# Patient Record
Sex: Male | Born: 1950 | Race: White | Hispanic: No | Marital: Married | Smoking: Former smoker
Health system: Southern US, Community
[De-identification: ages and names within clinical notes are randomized; demographics above are authoritative.]

## PROBLEM LIST (undated history)

## (undated) DIAGNOSIS — I480 Paroxysmal atrial fibrillation: Secondary | ICD-10-CM

## (undated) DIAGNOSIS — F32A Depression, unspecified: Secondary | ICD-10-CM

## (undated) DIAGNOSIS — J449 Chronic obstructive pulmonary disease, unspecified: Secondary | ICD-10-CM

## (undated) DIAGNOSIS — E785 Hyperlipidemia, unspecified: Secondary | ICD-10-CM

## (undated) DIAGNOSIS — M109 Gout, unspecified: Secondary | ICD-10-CM

## (undated) DIAGNOSIS — Z9221 Personal history of antineoplastic chemotherapy: Secondary | ICD-10-CM

## (undated) DIAGNOSIS — G40909 Epilepsy, unspecified, not intractable, without status epilepticus: Secondary | ICD-10-CM

## (undated) DIAGNOSIS — I341 Nonrheumatic mitral (valve) prolapse: Secondary | ICD-10-CM

## (undated) DIAGNOSIS — E291 Testicular hypofunction: Secondary | ICD-10-CM

## (undated) DIAGNOSIS — E875 Hyperkalemia: Secondary | ICD-10-CM

## (undated) DIAGNOSIS — M1990 Unspecified osteoarthritis, unspecified site: Secondary | ICD-10-CM

## (undated) DIAGNOSIS — K921 Melena: Secondary | ICD-10-CM

## (undated) DIAGNOSIS — F329 Major depressive disorder, single episode, unspecified: Secondary | ICD-10-CM

## (undated) DIAGNOSIS — IMO0002 Reserved for concepts with insufficient information to code with codable children: Secondary | ICD-10-CM

## (undated) DIAGNOSIS — R7303 Prediabetes: Secondary | ICD-10-CM

## (undated) DIAGNOSIS — R011 Cardiac murmur, unspecified: Secondary | ICD-10-CM

## (undated) DIAGNOSIS — C799 Secondary malignant neoplasm of unspecified site: Secondary | ICD-10-CM

## (undated) DIAGNOSIS — F17200 Nicotine dependence, unspecified, uncomplicated: Secondary | ICD-10-CM

## (undated) DIAGNOSIS — I10 Essential (primary) hypertension: Secondary | ICD-10-CM

## (undated) DIAGNOSIS — Z9981 Dependence on supplemental oxygen: Secondary | ICD-10-CM

## (undated) DIAGNOSIS — C61 Malignant neoplasm of prostate: Secondary | ICD-10-CM

## (undated) DIAGNOSIS — I2510 Atherosclerotic heart disease of native coronary artery without angina pectoris: Secondary | ICD-10-CM

## (undated) DIAGNOSIS — G5793 Unspecified mononeuropathy of bilateral lower limbs: Secondary | ICD-10-CM

## (undated) DIAGNOSIS — Z8719 Personal history of other diseases of the digestive system: Secondary | ICD-10-CM

## (undated) DIAGNOSIS — J189 Pneumonia, unspecified organism: Secondary | ICD-10-CM

## (undated) DIAGNOSIS — F419 Anxiety disorder, unspecified: Secondary | ICD-10-CM

## (undated) DIAGNOSIS — G43909 Migraine, unspecified, not intractable, without status migrainosus: Secondary | ICD-10-CM

## (undated) DIAGNOSIS — G4733 Obstructive sleep apnea (adult) (pediatric): Secondary | ICD-10-CM

## (undated) DIAGNOSIS — IMO0001 Reserved for inherently not codable concepts without codable children: Secondary | ICD-10-CM

## (undated) DIAGNOSIS — K635 Polyp of colon: Secondary | ICD-10-CM

## (undated) DIAGNOSIS — I209 Angina pectoris, unspecified: Secondary | ICD-10-CM

## (undated) DIAGNOSIS — Z9989 Dependence on other enabling machines and devices: Secondary | ICD-10-CM

## (undated) HISTORY — DX: Morbid (severe) obesity due to excess calories: E66.01

## (undated) HISTORY — DX: Essential (primary) hypertension: I10

## (undated) HISTORY — PX: HERNIA REPAIR: SHX51

## (undated) HISTORY — DX: Polyp of colon: K63.5

## (undated) HISTORY — PX: CARDIAC CATHETERIZATION: SHX172

## (undated) HISTORY — DX: Epilepsy, unspecified, not intractable, without status epilepticus: G40.909

## (undated) HISTORY — DX: Testicular hypofunction: E29.1

## (undated) HISTORY — DX: Hyperkalemia: E87.5

## (undated) HISTORY — DX: Unspecified osteoarthritis, unspecified site: M19.90

## (undated) HISTORY — DX: Melena: K92.1

## (undated) HISTORY — DX: Dependence on other enabling machines and devices: Z99.89

## (undated) HISTORY — PX: UMBILICAL HERNIA REPAIR: SHX196

## (undated) HISTORY — PX: CORONARY ANGIOPLASTY WITH STENT PLACEMENT: SHX49

## (undated) HISTORY — DX: Gout, unspecified: M10.9

## (undated) HISTORY — DX: Major depressive disorder, single episode, unspecified: F32.9

## (undated) HISTORY — DX: Anxiety disorder, unspecified: F41.9

## (undated) HISTORY — DX: Hyperlipidemia, unspecified: E78.5

## (undated) HISTORY — DX: Obstructive sleep apnea (adult) (pediatric): G47.33

## (undated) HISTORY — DX: Reserved for concepts with insufficient information to code with codable children: IMO0002

## (undated) HISTORY — DX: Nicotine dependence, unspecified, uncomplicated: F17.200

## (undated) HISTORY — DX: Reserved for inherently not codable concepts without codable children: IMO0001

## (undated) HISTORY — PX: CORONARY ANGIOPLASTY: SHX604

## (undated) HISTORY — DX: Paroxysmal atrial fibrillation: I48.0

## (undated) HISTORY — DX: Depression, unspecified: F32.A

## (undated) HISTORY — DX: Personal history of antineoplastic chemotherapy: Z92.21

## (undated) HISTORY — DX: Unspecified mononeuropathy of bilateral lower limbs: G57.93

## (undated) HISTORY — DX: Malignant neoplasm of prostate: C61

## (undated) HISTORY — DX: Atherosclerotic heart disease of native coronary artery without angina pectoris: I25.10

## (undated) HISTORY — DX: Chronic obstructive pulmonary disease, unspecified: J44.9

---

## 1997-10-30 ENCOUNTER — Inpatient Hospital Stay (HOSPITAL_COMMUNITY): Admission: EM | Admit: 1997-10-30 | Discharge: 1997-11-03 | Payer: Self-pay | Admitting: Emergency Medicine

## 2000-03-23 ENCOUNTER — Inpatient Hospital Stay (HOSPITAL_COMMUNITY): Admission: EM | Admit: 2000-03-23 | Discharge: 2000-03-25 | Payer: Self-pay | Admitting: Emergency Medicine

## 2000-09-21 ENCOUNTER — Encounter: Payer: Self-pay | Admitting: Emergency Medicine

## 2000-09-21 ENCOUNTER — Inpatient Hospital Stay (HOSPITAL_COMMUNITY): Admission: EM | Admit: 2000-09-21 | Discharge: 2000-09-23 | Payer: Self-pay | Admitting: Emergency Medicine

## 2001-12-03 ENCOUNTER — Encounter: Payer: Self-pay | Admitting: Family Medicine

## 2001-12-03 ENCOUNTER — Ambulatory Visit (HOSPITAL_COMMUNITY): Admission: RE | Admit: 2001-12-03 | Discharge: 2001-12-03 | Payer: Self-pay | Admitting: Family Medicine

## 2002-02-28 ENCOUNTER — Encounter: Payer: Self-pay | Admitting: Emergency Medicine

## 2002-02-28 ENCOUNTER — Inpatient Hospital Stay (HOSPITAL_COMMUNITY): Admission: EM | Admit: 2002-02-28 | Discharge: 2002-03-02 | Payer: Self-pay | Admitting: Emergency Medicine

## 2002-09-24 ENCOUNTER — Emergency Department (HOSPITAL_COMMUNITY): Admission: EM | Admit: 2002-09-24 | Discharge: 2002-09-24 | Payer: Self-pay | Admitting: Emergency Medicine

## 2002-09-24 ENCOUNTER — Encounter: Payer: Self-pay | Admitting: Emergency Medicine

## 2003-03-22 ENCOUNTER — Encounter: Payer: Self-pay | Admitting: Emergency Medicine

## 2003-03-22 ENCOUNTER — Inpatient Hospital Stay (HOSPITAL_COMMUNITY): Admission: EM | Admit: 2003-03-22 | Discharge: 2003-03-24 | Payer: Self-pay | Admitting: Emergency Medicine

## 2003-07-15 DIAGNOSIS — I2510 Atherosclerotic heart disease of native coronary artery without angina pectoris: Secondary | ICD-10-CM

## 2003-07-15 HISTORY — DX: Atherosclerotic heart disease of native coronary artery without angina pectoris: I25.10

## 2004-02-13 ENCOUNTER — Observation Stay (HOSPITAL_COMMUNITY): Admission: EM | Admit: 2004-02-13 | Discharge: 2004-02-14 | Payer: Self-pay

## 2004-03-05 ENCOUNTER — Ambulatory Visit (HOSPITAL_COMMUNITY): Admission: RE | Admit: 2004-03-05 | Discharge: 2004-03-06 | Payer: Self-pay | Admitting: *Deleted

## 2004-04-22 ENCOUNTER — Encounter (HOSPITAL_COMMUNITY): Admission: RE | Admit: 2004-04-22 | Discharge: 2004-07-21 | Payer: Self-pay | Admitting: *Deleted

## 2004-06-11 ENCOUNTER — Ambulatory Visit: Payer: Self-pay | Admitting: Cardiology

## 2004-06-11 ENCOUNTER — Inpatient Hospital Stay (HOSPITAL_COMMUNITY): Admission: AD | Admit: 2004-06-11 | Discharge: 2004-06-13 | Payer: Self-pay | Admitting: Cardiology

## 2004-06-28 ENCOUNTER — Ambulatory Visit: Payer: Self-pay

## 2004-07-14 DIAGNOSIS — K635 Polyp of colon: Secondary | ICD-10-CM

## 2004-07-14 HISTORY — DX: Polyp of colon: K63.5

## 2004-07-17 ENCOUNTER — Ambulatory Visit: Payer: Self-pay | Admitting: Cardiology

## 2004-07-17 ENCOUNTER — Inpatient Hospital Stay (HOSPITAL_COMMUNITY): Admission: EM | Admit: 2004-07-17 | Discharge: 2004-07-18 | Payer: Self-pay | Admitting: Family Medicine

## 2004-07-19 ENCOUNTER — Ambulatory Visit: Payer: Self-pay

## 2004-08-08 ENCOUNTER — Ambulatory Visit: Payer: Self-pay

## 2004-09-23 ENCOUNTER — Encounter (HOSPITAL_COMMUNITY): Admission: RE | Admit: 2004-09-23 | Discharge: 2004-12-22 | Payer: Self-pay | Admitting: Interventional Cardiology

## 2004-11-11 ENCOUNTER — Encounter: Payer: Self-pay | Admitting: Family Medicine

## 2004-12-12 ENCOUNTER — Ambulatory Visit (HOSPITAL_COMMUNITY): Admission: RE | Admit: 2004-12-12 | Discharge: 2004-12-12 | Payer: Self-pay | Admitting: *Deleted

## 2004-12-12 ENCOUNTER — Encounter (INDEPENDENT_AMBULATORY_CARE_PROVIDER_SITE_OTHER): Payer: Self-pay | Admitting: *Deleted

## 2004-12-12 ENCOUNTER — Encounter: Payer: Self-pay | Admitting: Family Medicine

## 2005-04-03 ENCOUNTER — Inpatient Hospital Stay (HOSPITAL_COMMUNITY): Admission: EM | Admit: 2005-04-03 | Discharge: 2005-04-04 | Payer: Self-pay | Admitting: Emergency Medicine

## 2005-07-14 LAB — HM COLONOSCOPY

## 2005-10-18 ENCOUNTER — Ambulatory Visit (HOSPITAL_COMMUNITY): Admission: RE | Admit: 2005-10-18 | Discharge: 2005-10-18 | Payer: Self-pay

## 2007-01-07 ENCOUNTER — Observation Stay (HOSPITAL_COMMUNITY): Admission: EM | Admit: 2007-01-07 | Discharge: 2007-01-08 | Payer: Self-pay | Admitting: Emergency Medicine

## 2007-01-08 ENCOUNTER — Encounter: Payer: Self-pay | Admitting: Family Medicine

## 2008-03-06 ENCOUNTER — Ambulatory Visit: Payer: Self-pay | Admitting: Cardiology

## 2008-03-06 ENCOUNTER — Inpatient Hospital Stay (HOSPITAL_COMMUNITY): Admission: AD | Admit: 2008-03-06 | Discharge: 2008-03-09 | Payer: Self-pay | Admitting: Cardiology

## 2008-03-06 ENCOUNTER — Encounter: Payer: Self-pay | Admitting: Emergency Medicine

## 2008-03-06 ENCOUNTER — Encounter: Payer: Self-pay | Admitting: Family Medicine

## 2008-05-14 ENCOUNTER — Encounter: Payer: Self-pay | Admitting: Family Medicine

## 2008-06-05 ENCOUNTER — Encounter: Payer: Self-pay | Admitting: Family Medicine

## 2008-11-22 ENCOUNTER — Emergency Department (HOSPITAL_BASED_OUTPATIENT_CLINIC_OR_DEPARTMENT_OTHER): Admission: EM | Admit: 2008-11-22 | Discharge: 2008-11-22 | Payer: Self-pay | Admitting: Emergency Medicine

## 2008-11-22 ENCOUNTER — Encounter: Payer: Self-pay | Admitting: Pulmonary Disease

## 2008-11-22 ENCOUNTER — Ambulatory Visit: Payer: Self-pay | Admitting: Radiology

## 2008-11-22 ENCOUNTER — Encounter: Payer: Self-pay | Admitting: Family Medicine

## 2009-04-09 DIAGNOSIS — I2583 Coronary atherosclerosis due to lipid rich plaque: Secondary | ICD-10-CM

## 2009-04-09 DIAGNOSIS — I2510 Atherosclerotic heart disease of native coronary artery without angina pectoris: Secondary | ICD-10-CM

## 2009-04-09 DIAGNOSIS — I10 Essential (primary) hypertension: Secondary | ICD-10-CM | POA: Insufficient documentation

## 2009-04-09 DIAGNOSIS — M1990 Unspecified osteoarthritis, unspecified site: Secondary | ICD-10-CM | POA: Insufficient documentation

## 2009-04-09 DIAGNOSIS — E669 Obesity, unspecified: Secondary | ICD-10-CM

## 2009-04-10 ENCOUNTER — Ambulatory Visit: Payer: Self-pay | Admitting: Pulmonary Disease

## 2009-04-10 DIAGNOSIS — R0602 Shortness of breath: Secondary | ICD-10-CM | POA: Insufficient documentation

## 2009-04-11 ENCOUNTER — Ambulatory Visit: Payer: Self-pay | Admitting: Pulmonary Disease

## 2009-04-13 ENCOUNTER — Encounter: Payer: Self-pay | Admitting: Pulmonary Disease

## 2009-04-13 ENCOUNTER — Telehealth: Payer: Self-pay | Admitting: Pulmonary Disease

## 2009-04-16 ENCOUNTER — Ambulatory Visit: Payer: Self-pay | Admitting: Pulmonary Disease

## 2009-04-16 DIAGNOSIS — J438 Other emphysema: Secondary | ICD-10-CM | POA: Insufficient documentation

## 2009-06-11 ENCOUNTER — Encounter: Payer: Self-pay | Admitting: Family Medicine

## 2009-08-27 ENCOUNTER — Telehealth (INDEPENDENT_AMBULATORY_CARE_PROVIDER_SITE_OTHER): Payer: Self-pay | Admitting: *Deleted

## 2009-12-14 ENCOUNTER — Telehealth (INDEPENDENT_AMBULATORY_CARE_PROVIDER_SITE_OTHER): Payer: Self-pay | Admitting: *Deleted

## 2010-06-24 ENCOUNTER — Telehealth: Payer: Self-pay | Admitting: Family Medicine

## 2010-06-24 ENCOUNTER — Ambulatory Visit: Payer: Self-pay | Admitting: Family Medicine

## 2010-06-24 DIAGNOSIS — L259 Unspecified contact dermatitis, unspecified cause: Secondary | ICD-10-CM

## 2010-06-24 DIAGNOSIS — J069 Acute upper respiratory infection, unspecified: Secondary | ICD-10-CM | POA: Insufficient documentation

## 2010-07-11 ENCOUNTER — Encounter: Payer: Self-pay | Admitting: Family Medicine

## 2010-07-11 DIAGNOSIS — F17200 Nicotine dependence, unspecified, uncomplicated: Secondary | ICD-10-CM | POA: Insufficient documentation

## 2010-07-22 ENCOUNTER — Encounter: Payer: Self-pay | Admitting: Family Medicine

## 2010-08-04 ENCOUNTER — Encounter: Payer: Self-pay | Admitting: *Deleted

## 2010-08-15 NOTE — Cardiovascular Report (Signed)
Summary: Lyn Records MD  Lyn Records MD   Imported By: Lester [place] 07/22/2010 10:25:27  _____________________________________________________________________  External Attachment:    Type:   Image     Comment:   External Document

## 2010-08-15 NOTE — Miscellaneous (Signed)
  Clinical Lists Changes  Problems: Added new problem of TOBACCO ABUSE (ICD-305.1) Allergies: Changed allergy or adverse reaction from ELAVIL to ELAVIL Observations: Added new observation of PAST MED HX: Obstructive sleep apnea DEGENERATIVE JOINT DISEASE (ICD-715.90) HYPERCHOLESTEROLEMIA (ICD-272.0) HYPERTENSION (ICD-401.9) CORONARY ARTERY DISEASE (ICD-414.00)--s/p MI 2005.  s/p PCI with stent (pt reports 20+ interventions in the past, most recent cath 6/08 showed patent stents).  Normal LV function. Nuclear stress test NEG 8/09. COPD Depression (?bipolar dx by psychiatrist?) Anxiety (07/11/2010 9:53) Added new observation of ALLERGY REV: Done (07/11/2010 9:53) Added new observation of PRIMARY MD: Michell Heinrich M.D. (07/11/2010 9:53)       Allergies: 1)  ! Zetia (Ezetimibe) 2)  ! Elavil    Past History:  Past Medical History: Obstructive sleep apnea DEGENERATIVE JOINT DISEASE (ICD-715.90) HYPERCHOLESTEROLEMIA (ICD-272.0) HYPERTENSION (ICD-401.9) CORONARY ARTERY DISEASE (ICD-414.00)--s/p MI 2005.  s/p PCI with stent (pt reports 20+ interventions in the past, most recent cath 6/08 showed patent stents).  Normal LV function. Nuclear stress test NEG 8/09. COPD Depression (?bipolar dx by psychiatrist?) Anxiety

## 2010-08-15 NOTE — Progress Notes (Signed)
  Records Request recieved from DDS sent to [facility]  December 14, 2009 8:49 AM

## 2010-08-15 NOTE — Progress Notes (Signed)
Summary: question  Phone Note Call from Patient Call back at [phone]   Caller: Patient Call For: clance Reason for Call: Talk to Nurse Summary of Call: has a couple questions re: PFT report. Initial call taken by: Eugene Gavia,  August 27, 2009 1:42 PM  Follow-up for Phone Call        Spoke with pt and gave results. Follow-up by: Vernie Murders,  August 27, 2009 2:39 PM

## 2010-08-15 NOTE — Assessment & Plan Note (Signed)
Summary: COPD , BREATHING PROBLEMS/VFW rsch per pt/dt   Vital Signs:  Patient profile:   60 year old male Height:      70.5 inches (179.07 cm) Weight:      248.50 pounds (112.95 kg) BMI:     35.28 O2 Sat:      93 % on Room air Temp:     98.4 degrees F (36.89 degrees C) oral Pulse rate:   72 / minute BP sitting:   138 / 85  (right arm) Cuff size:   large  Vitals Entered By: Josph Macho RMA (June 24, 2010 1:06 PM)  O2 Flow:  Room air CC: Establish new patient/ chronic bronchitis/ CF Is Patient Diabetic? No   History of Present Illness: 60 y/o WM here for the first time, formerly seen at [facility] in [place].   CC is cough. Reports 2 wk history of nasal congestion, PND, coughing, chest wheeze, SOB, feeling fatigued.  No fevers, no body aches.  No face pain but mild sinus HA/pressure.  No chest pain, no arm pain, no nausea.  No abd pain, no LE swelling or pain. He saw Dr. Shelle Iron in [place] pulmonology once but never went back ---cites personality clash. He is currently on a study drug through a pharmaceutical company: fluticasone/vilanterol 100/25--since 09/2009.  He also takes proAir 2 puffs q4h as needed--most recently last night. He also has nebulized albuterol but has run out.  Asks for RFs of all meds today.  Also has 30mo history of itchy spot on right scapula area, round and flaky, no response to OTC hydrocortisone ointment.  Has hx of similar dry/itchy areas in the past but mostly on elbows. Other PMH: wears CPAP but not the last 2 wks with this illness.  Doesn't know settings---he apparently bought the machine after a sleep study 2-3 years ago was mildly abnormal per his report today.   He has smoked x 45 years or so, now on about 1 pack per day, wants to quit and says he is ready but hasn't picked a quit date. He is disabled secondary to COPD per his report today.  He worked in Data processing manager the years, nothing in the last 3 years or so. He has  not had his flu vaccine yet this season.   Preventive Screening-Counseling & Management  Alcohol-Tobacco     Smoking Status: current  Current Problems (verified): 1)  Emphysema  (ICD-492.8) 2)  Dyspnea  (ICD-786.05) 3)  Degenerative Joint Disease  (ICD-715.90) 4)  Obesity  (ICD-278.00) 5)  Hypercholesterolemia  (ICD-272.0) 6)  Hypertension  (ICD-401.9) 7)  Coronary Artery Disease  (ICD-414.00)  Medications Prior to Update: 1)  Bayer Aspirin 325 Mg Tabs (Aspirin) .... Take 1 Tablet By Mouth Once A Day 2)  Lisinopril 20 Mg Tabs (Lisinopril) .Marland Kitchen.. 1 By Mouth Two Times A Day 3)  Lasix 40 Mg Tabs (Furosemide) .... Take 1 Tablet By Mouth Once A Day As Needed 4)  Klor-Con 20 Meq Pack (Potassium Chloride) .... Take 1 Tablet By Mouth Once A Day As Needed 5)  Coreg 12.5 Mg Tabs (Carvedilol) .Marland Kitchen.. 1 By Mouth Two Times A Day 6)  Doxazosin Mesylate 2 Mg Tabs (Doxazosin Mesylate) .Marland Kitchen.. 1 By Mouth Daily 7)  Hydrochlorothiazide 25 Mg Tabs (Hydrochlorothiazide) .Marland Kitchen.. 1 By Mouth Daily 8)  Lovastatin 40 Mg Tabs (Lovastatin) .Marland Kitchen.. 1 By Mouth Daily 9)  Niacin 500 Mg Tabs (Niacin) .Marland Kitchen.. 1 By Mouth Daily 10)  Guaifenesin 200 Mg Tabs (Guaifenesin) .... As Needed 11)  Symbicort 160-4.5 Mcg/act  Aero (Budesonide-Formoterol Fumarate) .... Two Puffs Twice Daily 12)  Proair Hfa 108 (90 Base) Mcg/act  Aers (Albuterol Sulfate) .... 2 Puffs Every 4-6 Hours As Needed  Current Medications (verified): 1)  Bayer Aspirin 325 Mg Tabs (Aspirin) .... Take 1 Tablet By Mouth Once A Day 2)  Lisinopril 20 Mg Tabs (Lisinopril) .Marland Kitchen.. 1 By Mouth Two Times A Day 3)  Lasix 40 Mg Tabs (Furosemide) .... Take 1 Tablet By Mouth Once A Day As Needed 4)  Doxazosin Mesylate 2 Mg Tabs (Doxazosin Mesylate) .Marland Kitchen.. 1 By Mouth Daily 5)  Hydrochlorothiazide 25 Mg Tabs (Hydrochlorothiazide) .Marland Kitchen.. 1 By Mouth Daily 6)  Lovastatin 40 Mg Tabs (Lovastatin) .Marland Kitchen.. 1 By Mouth Daily 7)  Guaifenesin 200 Mg Tabs (Guaifenesin) .... As Needed 8)  Proair Hfa 108  (90 Base) Mcg/act  Aers (Albuterol Sulfate) .... 2 Puffs Every 4-6 Hours As Needed 9)  Citalopram Hydrobromide 40 Mg Tabs (Citalopram Hydrobromide) .... Once Daily 10)  Bisoprolol-Hydrochlorothiazide 5-6.25 Mg Tabs (Bisoprolol-Hydrochlorothiazide) .... Once Daily 11)  Cyclobenzaprine Hcl 10 Mg Tabs (Cyclobenzaprine Hcl) .... Once Daily 12)  Albuterol Sulfate (2.5 Mg/69ml) 0.083% Nebu (Albuterol Sulfate) .... Q 4 Hrs As Needed 13)  Prednisone 20 Mg Tabs (Prednisone) .... 3 Tabs By Mouth Once Daily X 5d 14)  Zithromax Z-Pak 250 Mg Tabs (Azithromycin) .... As Directed 15)  Triamcinolone Acetonide 0.5 % Crea (Triamcinolone Acetonide) .... Apply Two Times A Day To Affected Areas As Needed  Allergies (verified): 1)  ! Zetia (Ezetimibe) 2)  ! Elavil  Past History:  Family History: Last updated: 06/24/2010 father with heart disease, bipolar disorder mother with arthritis  Social History: Last updated: 06/24/2010 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 tobacco up to 2 ppd for 5yrs...currrently 1 ppd married with children.  Past Medical History: Obstructive sleep apnea DEGENERATIVE JOINT DISEASE (ICD-715.90) HYPERCHOLESTEROLEMIA (ICD-272.0) HYPERTENSION (ICD-401.9) CORONARY ARTERY DISEASE (ICD-414.00)--s/p MI 2005.  s/p PCI with stent (pt reports 20+ interventions in the past) COPD Depression    Past Surgical History: none  Family History: Reviewed history from 04/10/2009 and no changes required. father with heart disease, bipolar disorder mother with arthritis  Social History: Reviewed history from 04/10/2009 and no changes required. 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 tobacco up to 2 ppd for 51yrs...currrently 1 ppd married with children.  Review of Systems  The patient denies vision loss, decreased hearing, chest pain, syncope, hemoptysis,  abdominal pain, melena, hematochezia, severe indigestion/heartburn, hematuria, incontinence, genital sores, muscle weakness, difficulty walking, and enlarged lymph nodes.         Has had 65-70 lbs of PURPOSEFUL wt loss over the last 12-18 months.    Physical Exam  General:  VS: noted, all normal. Gen: Alert, well appearing, oriented x 4.  Moderately obese appearing. HEENT: Scalp without lesions or hair loss.  Ears: EACs clear, normal epithelium.  TMs with good light reflex and landmarks bilaterally.  Eyes: no injection, icteris, swelling, or exudate.  EOMI, PERRLA. Nose: no drainage or turbinate edema/swelling.  No inection or focal lesion.  Mouth: lips without lesion/swelling.  Oral mucosa pink and moist.  Dentition intact and without obvious caries or gingival swelling.  Oropharynx without erythema, exudate, or swelling.  Neck: supple.  No lymphadenopathy, thyromegaly, or mass. Chest: symmetric expansion, with nonlabored respirations.  Clear on inspiration, with mildly prolonged expiratory phase and faint expiratory wheezing diffusely.  Nonlabored resps.  +coughing after  forced exhalation. CV: RRR, no m/r/g.  Peripheral pulses 2+/symmetric. ABD: soft, NT, ND. EXT: no edema, no cyanosis.  He does have mild hyperkeratosis and freckling in anterior tibial areas/ankles bilaterally.  PT pulses 2+ bilat. SKIN: elbows with dry, flaky skin without erythema. Right scapular area with pinkish oval patch of flaky/hyperkeratotic skin, well demarcated borders.  Similar, although less distinct, area on left mid-back area.  .   Impression & Recommendations:  Problem # 1:  UPPER RESPIRATORY INFECTION, ACUTE, WITH BRONCHITIS (ICD-465.9) Assessment New Prednisone 20mg , 3 tabs once daily x 5d. Z-pack.  Continue all current inhalers. Advised smoking cessation. Encouraged pt to get flu vaccine at pharmacy--we don't have this to offer currently. I authorized RF of his chronic meds electronically today.  He  will make appt for CPE and fasting labs in 6-8 wks.  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  Problem # 3:  HYPERTENSION (ICD-401.9) Assessment: Unchanged He was on potassium supplement but quit taking this at unknown time in recent past.    The following medications were removed from the medication list:    Coreg 12.5 Mg Tabs (Carvedilol) .Marland Kitchen... 1 by mouth two times a day His updated medication list for this problem includes:    Lisinopril 20 Mg Tabs (Lisinopril) .Marland Kitchen... 1 by mouth two times a day    Lasix 40 Mg Tabs (Furosemide) .Marland Kitchen... Take 1 tablet by mouth once a day as needed    Doxazosin Mesylate 2 Mg Tabs (Doxazosin mesylate) .Marland Kitchen... 1 by mouth daily    Hydrochlorothiazide 25 Mg Tabs (Hydrochlorothiazide) .Marland Kitchen... 1 by mouth daily    Bisoprolol-hydrochlorothiazide 5-6.25 Mg Tabs (Bisoprolol-hydrochlorothiazide) ..... Once daily  Problem # 4:  CORONARY ARTERY DISEASE (ICD-414.00) Need to obtain old records for review.  The following medications were removed from the medication list:    Coreg 12.5 Mg Tabs (Carvedilol) .Marland Kitchen... 1 by mouth two times a day His updated medication list for this problem includes:    Bayer Aspirin 325 Mg Tabs (Aspirin) .Marland Kitchen... Take 1 tablet by mouth once a day    Lisinopril 20 Mg Tabs (Lisinopril) .Marland Kitchen... 1 by mouth two times a day    Lasix 40 Mg Tabs (Furosemide) .Marland Kitchen... Take 1 tablet by mouth once a day as needed    Doxazosin Mesylate 2 Mg Tabs (Doxazosin mesylate) .Marland Kitchen... 1 by mouth daily    Hydrochlorothiazide 25 Mg Tabs (Hydrochlorothiazide) .Marland Kitchen... 1 by mouth daily    Bisoprolol-hydrochlorothiazide 5-6.25 Mg Tabs (Bisoprolol-hydrochlorothiazide) ..... Once daily  Complete Medication List: 1)  Bayer Aspirin 325 Mg Tabs (Aspirin) .... Take 1 tablet by mouth once a day 2)  Lisinopril 20 Mg Tabs (Lisinopril) .Marland Kitchen.. 1 by mouth two times a day 3)  Lasix 40 Mg Tabs (Furosemide) .... Take 1 tablet by  mouth once a day as needed 4)  Doxazosin Mesylate 2 Mg Tabs (Doxazosin mesylate) .Marland Kitchen.. 1 by mouth daily 5)  Hydrochlorothiazide 25 Mg Tabs (Hydrochlorothiazide) .Marland Kitchen.. 1 by mouth daily 6)  Lovastatin 40 Mg Tabs (Lovastatin) .Marland Kitchen.. 1 by mouth daily 7)  Guaifenesin 200 Mg Tabs (Guaifenesin) .... As needed 8)  Proair Hfa 108 (90 Base) Mcg/act Aers (Albuterol sulfate) .... 2 puffs every 4-6 hours as needed 9)  Citalopram Hydrobromide 40 Mg Tabs (Citalopram hydrobromide) .... Once daily 10)  Bisoprolol-hydrochlorothiazide 5-6.25 Mg Tabs (Bisoprolol-hydrochlorothiazide) .... Once daily 11)  Cyclobenzaprine Hcl 10 Mg Tabs (Cyclobenzaprine hcl) .... Once daily 12)  Albuterol Sulfate (2.5  Mg/23ml) 0.083% Nebu (Albuterol sulfate) .... Q 4 hrs as needed 13)  Prednisone 20 Mg Tabs (Prednisone) .... 3 tabs by mouth once daily x 5d 14)  Zithromax Z-pak 250 Mg Tabs (Azithromycin) .... As directed 15)  Triamcinolone Acetonide 0.5 % Crea (Triamcinolone acetonide) .... Apply two times a day to affected areas as needed  Other Orders: Prescription Created Electronically [phone])  Patient Instructions: 1)  Try to stop smoking completely. 2)  Make f/u visit to recheck this condition if not improved in 5-6 days. 3)  Make appt in 6-8 wks for routine CPE with fasting labs--morning appointment. Prescriptions: TRIAMCINOLONE ACETONIDE 0.5 % CREA (TRIAMCINOLONE ACETONIDE) apply two times a day to affected areas as needed  #1 small tube x 1   Entered and Authorized by:   Michell Heinrich M.D.   Signed by:   Michell Heinrich M.D. on 06/24/2010   Method used:   Electronically to        [facility] [phone]* (retail)       [address] [place], [place]  [address]       Ph: [phone]       Fax: [phone]   RxID:   [id number] ALBUTEROL SULFATE (2.5 MG/3ML) 0.083% NEBU (ALBUTEROL SULFATE) q 4 hrs as needed  #3 boxes x 0   Entered and Authorized by:   Michell Heinrich M.D.   Signed by:   Michell Heinrich M.D. on 06/24/2010   Method used:   Electronically to        [facility] [phone]* (retail)       [address] [place], [place]  [address]       Ph: [phone]       Fax: [phone]   RxID:   [id number] CYCLOBENZAPRINE HCL 10 MG TABS (CYCLOBENZAPRINE HCL) once daily  #90 x 0   Entered and Authorized by:   Michell Heinrich M.D.   Signed by:   Michell Heinrich M.D. on 06/24/2010   Method used:   Electronically to        [facility] [phone]* (retail)       [address] [place], [place]  [address]       Ph: [phone]       Fax: [phone]   RxID:   [id number] BISOPROLOL-HYDROCHLOROTHIAZIDE 5-6.25 MG TABS (BISOPROLOL-HYDROCHLOROTHIAZIDE) once daily  #90 x 0   Entered and Authorized by:   Michell Heinrich M.D.   Signed by:   Michell Heinrich M.D. on 06/24/2010   Method used:   Electronically to        [facility] [phone]* (retail)       [address] [place], [place]  [address]       Ph: [phone]       Fax: [phone]   RxID:   [id number] CITALOPRAM HYDROBROMIDE 40 MG TABS (CITALOPRAM HYDROBROMIDE) once daily  #90 x 0   Entered and Authorized by:   Michell Heinrich M.D.   Signed by:   Michell Heinrich M.D. on 06/24/2010   Method used:   Electronically to        [facility] [phone]* (retail)       [address] [place], [place]  [address]  Ph: [phone]       Fax: [phone]   RxID:   [id number] LOVASTATIN 40 MG TABS (LOVASTATIN) 1 by mouth daily  #90 x 0   Entered and Authorized by:   Michell Heinrich M.D.   Signed by:   Michell Heinrich M.D. on 06/24/2010   Method used:   Electronically to        [facility] [phone]* (retail)       [address] [place], [place]  [address]       Ph: [phone]       Fax: [phone]   RxID:   [id number] HYDROCHLOROTHIAZIDE 25 MG TABS (HYDROCHLOROTHIAZIDE) 1 by mouth daily  #90 x 0   Entered and Authorized by:    Michell Heinrich M.D.   Signed by:   Michell Heinrich M.D. on 06/24/2010   Method used:   Electronically to        [facility] [phone]* (retail)       [address] [place], [place]  [address]       Ph: [phone]       Fax: [phone]   RxID:   [id number] DOXAZOSIN MESYLATE 2 MG TABS (DOXAZOSIN MESYLATE) 1 by mouth daily  #90 x 0   Entered and Authorized by:   Michell Heinrich M.D.   Signed by:   Michell Heinrich M.D. on 06/24/2010   Method used:   Electronically to        [facility] [phone]* (retail)       [address] [place], [place]  [address]       Ph: [phone]       Fax: [phone]   RxID:   [id number] LASIX 40 MG TABS (FUROSEMIDE) Take 1 tablet by mouth once a day as needed  #90 x 0   Entered and Authorized by:   Michell Heinrich M.D.   Signed by:   Michell Heinrich M.D. on 06/24/2010   Method used:   Electronically to        [facility] [phone]* (retail)       [address] [place], [place]  [address]       Ph: [phone]       Fax: [phone]   RxID:   [id number] LISINOPRIL 20 MG TABS (LISINOPRIL) 1 by mouth two times a day  #180 x 0   Entered and Authorized by:   Michell Heinrich M.D.   Signed by:   Michell Heinrich M.D. on 06/24/2010   Method used:   Electronically to        [facility] [phone]* (retail)       [address] [place], [place]  [address]       Ph: [phone]       Fax: [phone]   RxID:   [id number] ZITHROMAX Z-PAK 250 MG TABS (AZITHROMYCIN) as directed  #1 pack x 0   Entered and Authorized by:   Michell Heinrich M.D.   Signed by:   Michell Heinrich M.D. on 06/24/2010   Method used:   Electronically to        [facility] [phone]* (retail)       [address]  [place], [place]  [address]       Ph: [phone]       Fax: [phone]   RxID:   [id number] PREDNISONE 20 MG TABS (PREDNISONE) 3 tabs by mouth once daily x 5d  #15 x 0    Entered and Authorized by:   Michell Heinrich M.D.   Signed by:   Michell Heinrich M.D. on 06/24/2010   Method used:   Electronically to        [facility] [phone]* (retail)       [address] [place], [place]  [address]       Ph: [phone]       Fax: [phone]   RxID:   [id number]    Orders Added: 1)  Prescription Created Electronically [G8553] 2)  New Patient Level III [32355]    Preventive Care Screening  Last Tetanus Booster:    Date:  07/15/2007    Results:  Historical   Colonoscopy:    Date:  07/14/2005    Results:  historical    Appended Document: COPD , BREATHING PROBLEMS/VFW rsch per pt/dt    Clinical Lists Changes  Orders: Added new Service order of New Patient Level III [phone]) - Signed

## 2010-08-15 NOTE — Miscellaneous (Signed)
  Clinical Lists Changes  Observations: Added new observation of PRIMARY MD: Michell Heinrich M.D. (07/22/2010 10:28) Added new observation of PSA: 1.97 (nl) (05/14/2008 10:29)        Preventive Care Screening  PSA:    Date:  05/14/2008    Results:  1.97 (nl)

## 2010-08-15 NOTE — Progress Notes (Signed)
Summary: Medical Records Request  Phone Note Outgoing Call   Call placed by: Lannette Donath,  June 24, 2010 1:45 PM Summary of Call: Requested medical records from [facility] Initial call taken by: Lannette Donath,  June 24, 2010 1:45 PM

## 2010-08-21 ENCOUNTER — Telehealth: Payer: Self-pay | Admitting: Family Medicine

## 2010-08-21 ENCOUNTER — Encounter: Payer: Self-pay | Admitting: Family Medicine

## 2010-08-21 ENCOUNTER — Encounter (INDEPENDENT_AMBULATORY_CARE_PROVIDER_SITE_OTHER): Payer: Self-pay | Admitting: Family Medicine

## 2010-08-21 DIAGNOSIS — Z0000 Encounter for general adult medical examination without abnormal findings: Secondary | ICD-10-CM

## 2010-08-21 DIAGNOSIS — Z23 Encounter for immunization: Secondary | ICD-10-CM

## 2010-08-21 DIAGNOSIS — N529 Male erectile dysfunction, unspecified: Secondary | ICD-10-CM | POA: Insufficient documentation

## 2010-08-21 DIAGNOSIS — Z87448 Personal history of other diseases of urinary system: Secondary | ICD-10-CM | POA: Insufficient documentation

## 2010-08-21 DIAGNOSIS — Z1211 Encounter for screening for malignant neoplasm of colon: Secondary | ICD-10-CM

## 2010-08-21 LAB — CONVERTED CEMR LAB
Bilirubin Urine: NEGATIVE
Blood in Urine, dipstick: NEGATIVE
Ketones, urine, test strip: NEGATIVE
Protein, U semiquant: NEGATIVE
Urobilinogen, UA: 0.2

## 2010-08-22 ENCOUNTER — Encounter: Payer: Self-pay | Admitting: Family Medicine

## 2010-08-23 ENCOUNTER — Encounter: Payer: Self-pay | Admitting: Family Medicine

## 2010-08-23 LAB — CONVERTED CEMR LAB
Albumin: 4.6 g/dL (ref 3.5–5.2)
Alkaline Phosphatase: 71 units/L (ref 39–117)
BUN: 18 mg/dL (ref 6–23)
CO2: 25 meq/L (ref 19–32)
Cholesterol: 166 mg/dL (ref 0–200)
Eosinophils Absolute: 0.5 10*3/uL (ref 0.0–0.7)
Eosinophils Relative: 4 % (ref 0–5)
Glucose, Bld: 85 mg/dL (ref 70–99)
HCT: 51.7 % (ref 39.0–52.0)
HDL: 27 mg/dL — ABNORMAL LOW (ref >39)
Hemoglobin: 17 g/dL (ref 13.0–17.0)
LDL Cholesterol: 95 mg/dL (ref 0–99)
Lymphocytes Relative: 27 % (ref 12–46)
Lymphs Abs: 3.1 10*3/uL (ref 0.7–4.0)
MCV: 89.4 fL (ref 78.0–100.0)
Monocytes Relative: 6 % (ref 3–12)
PSA: 4.91 ng/mL — ABNORMAL HIGH (ref <=4.00)
RBC: 5.78 M/uL (ref 4.22–5.81)
Total Bilirubin: 0.6 mg/dL (ref 0.3–1.2)
Triglycerides: 222 mg/dL — ABNORMAL HIGH (ref <150)
WBC: 11.3 10*3/uL — ABNORMAL HIGH (ref 4.0–10.5)

## 2010-08-26 ENCOUNTER — Telehealth: Payer: Self-pay | Admitting: Family Medicine

## 2010-08-26 ENCOUNTER — Encounter: Payer: Self-pay | Admitting: Family Medicine

## 2010-08-26 DIAGNOSIS — R972 Elevated prostate specific antigen [PSA]: Secondary | ICD-10-CM | POA: Insufficient documentation

## 2010-08-26 LAB — CONVERTED CEMR LAB: LH: 4.5 milliintl units/mL (ref 1.5–9.3)

## 2010-08-27 ENCOUNTER — Telehealth: Payer: Self-pay | Admitting: Family Medicine

## 2010-08-27 DIAGNOSIS — E782 Mixed hyperlipidemia: Secondary | ICD-10-CM | POA: Insufficient documentation

## 2010-08-29 NOTE — Miscellaneous (Signed)
  Clinical Lists Changes  Observations: Added new observation of PAST MED HX: Obstructive sleep apnea (RestMed S6 machine, set at 4.6-6.4) DEGENERATIVE JOINT DISEASE (ICD-715.90) HYPERCHOLESTEROLEMIA (ICD-272.0) HYPERTENSION (ICD-401.9) CORONARY ARTERY DISEASE (ICD-414.00)--s/p MI 2005.  s/p PCI with stent (pt reports 20+ interventions in the past, most recent cath 6/08 showed patent stents).  Normal LV function. Nuclear stress test NEG 8/09. COPD Depression (?bipolar dx by psychiatrist?) Anxiety Erectile dysfunction Morbid obesity Colon polyp (ganglioneuroma), mild diverticulosis: TCS 2006--Dr. Luther Parody...consider repeat in 5 yrs. (08/22/2010 8:21) Added new observation of PRIMARY MD: Michell Heinrich M.D. (08/22/2010 8:21)       Past History:  Past Medical History: Obstructive sleep apnea (RestMed S6 machine, set at 4.6-6.4) DEGENERATIVE JOINT DISEASE (ICD-715.90) HYPERCHOLESTEROLEMIA (ICD-272.0) HYPERTENSION (ICD-401.9) CORONARY ARTERY DISEASE (ICD-414.00)--s/p MI 2005.  s/p PCI with stent (pt reports 20+ interventions in the past, most recent cath 6/08 showed patent stents).  Normal LV function. Nuclear stress test NEG 8/09. COPD Depression (?bipolar dx by psychiatrist?) Anxiety Erectile dysfunction Morbid obesity Colon polyp (ganglioneuroma), mild diverticulosis: TCS 2006--Dr. Luther Parody...consider repeat in 5 yrs.

## 2010-08-29 NOTE — Assessment & Plan Note (Signed)
Summary: Fasting CPE/ DT   Vital Signs:  Patient profile:   60 year old male Height:      70.5 inches Weight:      317 pounds BMI:     45.00 O2 Sat:      94 % on Room air Pulse rate:   78 / minute Pulse rhythm:   regular BP sitting:   124 / 80  (left arm) Cuff size:   large  Vitals Entered By: Francee Piccolo [name] Duncan Dull) (August 21, 2010 8:35 AM)  O2 Flow:  Room air CC: CPE//needs refills//SP Is Patient Diabetic? No   History of Present Illness: 60 y/o WM, here for annual health maintenance exam. Feeling well currently.  Requests flu vaccine today. Reports waking up in a.m. a couple of times this week with some blood spots on underwear, presumably leaking out of penis.  However, denies pain with urination, no abnormal color of urine, no urgency or hesitancy.  Has chronic polyuria --says he drinks LOTS of water all day plus he is on hctz daily, lasix as needed. Reports having some burning pain in left iliac crest area focally-- on and off (random) a month or so ago.  No injury, no connection with any activity or other pains.  NO radiation, no numbness/tingling.  Happened on and off for a month or so, but now has not happened for about 3-4 wks.  No rash. He does still smoke.  Is hesitant to quit b/c past attempts have resulted in him "switching one addiction for another"...in other words he ate excessively and gained 20-30 lbs on two separate occasions. Reports that he wants to switch his specialist care back to [facility]: saw Dr. Katrinka Blazing at [facility] cardiology and has had a couple of "negative" caths in recent years per pt report, and also has history of colon polyps and says about 3 yrs ago was his last colonoscopy by an MD at [facility].  No old records available at this time. Denies melena or BRBPR or any recent change in bowel habits.   Denies CP, SOB, nausea, diaphoresis, or palpitations. Since last visit he has researched his CPAP machine, discovered that his settings were put at  4.6-6.4 for mild OSA and he restarted this a few days ago and already feels the benefits. Has had erectile dysfunction for many years, has never had testosterone level checked.     Preventive Screening-Counseling & Management  Alcohol-Tobacco     Alcohol drinks/day: <1     Smoking Status: current  Current Medications (verified): 1)  Bayer Aspirin 325 Mg Tabs (Aspirin) .... Take 1 Tablet By Mouth Once A Day 2)  Lisinopril 20 Mg Tabs (Lisinopril) .Marland Kitchen.. 1 By Mouth Two Times A Day 3)  Lasix 40 Mg Tabs (Furosemide) .... Take 1 Tablet By Mouth Once A Day As Needed 4)  Doxazosin Mesylate 2 Mg Tabs (Doxazosin Mesylate) .Marland Kitchen.. 1 By Mouth Daily 5)  Hydrochlorothiazide 25 Mg Tabs (Hydrochlorothiazide) .Marland Kitchen.. 1 By Mouth Daily 6)  Lovastatin 40 Mg Tabs (Lovastatin) .Marland Kitchen.. 1 By Mouth Daily 7)  Guaifenesin 200 Mg Tabs (Guaifenesin) .... As Needed 8)  Proair Hfa 108 (90 Base) Mcg/act  Aers (Albuterol Sulfate) .... 2 Puffs Every 4-6 Hours As Needed 9)  Citalopram Hydrobromide 40 Mg Tabs (Citalopram Hydrobromide) .... Once Daily 10)  Bisoprolol-Hydrochlorothiazide 5-6.25 Mg Tabs (Bisoprolol-Hydrochlorothiazide) .... Once Daily 11)  Cyclobenzaprine Hcl 10 Mg Tabs (Cyclobenzaprine Hcl) .... Once Daily 12)  Albuterol Sulfate (2.5 Mg/44ml) 0.083% Nebu (Albuterol Sulfate) .... Q  4 Hrs As Needed 13)  Triamcinolone Acetonide 0.5 % Crea (Triamcinolone Acetonide) .... Apply Two Times A Day To Affected Areas As Needed  Allergies (verified): 1)  ! Zetia (Ezetimibe) 2)  ! Elavil  Past History:  Past Surgical History: Last updated: 06/24/2010 none  Family History: Last updated: 06/24/2010 father with heart disease, bipolar disorder mother with arthritis  Social History: Last updated: 06/24/2010 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 tobacco up to 2 ppd for 44yrs...currrently 1 ppd married with children.  Risk Factors: Alcohol Use: <1  (08/21/2010)  Risk Factors: Smoking Status: current (08/21/2010) Packs/Day: 0.5 (04/10/2009)  Past Medical History: Obstructive sleep apnea (RestMed S6 machine, set at 4.6-6.4) DEGENERATIVE JOINT DISEASE (ICD-715.90) HYPERCHOLESTEROLEMIA (ICD-272.0) HYPERTENSION (ICD-401.9) CORONARY ARTERY DISEASE (ICD-414.00)--s/p MI 2005.  s/p PCI with stent (pt reports 20+ interventions in the past, most recent cath 6/08 showed patent stents).  Normal LV function. Nuclear stress test NEG 8/09. COPD Depression (?bipolar dx by psychiatrist?) Anxiety Erectile dysfunction Morbid obesity  Review of Systems       The patient complains of weight gain and peripheral edema.  The patient denies anorexia, fever, weight loss, vision loss, decreased hearing, hoarseness, chest pain, syncope, dyspnea on exertion, prolonged cough, headaches, hemoptysis, abdominal pain, melena, hematochezia, severe indigestion/heartburn, hematuria, genital sores, muscle weakness, suspicious skin lesions, transient blindness, difficulty walking, depression, unusual weight change, abnormal bleeding, enlarged lymph nodes, angioedema, breast masses, and testicular masses.    Physical Exam  General:  VS: noted, all normal. Gen: Alert, well appearing, oriented x 4. HEENT: Scalp without lesions or hair loss.  Ears: EACs clear, normal epithelium.  TMs with good light reflex and landmarks bilaterally.  Eyes: no injection, icteris, swelling, or exudate.  EOMI, PERRLA. Nose: no drainage or turbinate edema/swelling.  No injection or focal lesion.  Mouth: lips without lesion/swelling.  Oral mucosa pink and moist.  Dentition intact and without obvious caries or gingival swelling.  Oropharynx without erythema, exudate, or swelling.  Neck: supple.  No lymphadenopathy, thyromegaly, or mass. Chest: symmetric expansion, with nonlabored respirations.  Clear and equal breath sounds in all lung fields.   CV: RRR, no m/r/g.  Peripheral pulses  2+/symmetric. ABD: soft, NT, ND, BS normal.  No hepatospenomegaly or mass.  No bruits. EXT: No tenderness with palpation of iliac areas or greater troch areas bilaterally.  No clubbing or cyanosis.  1+ pitting edema from mid tibia level into ankles/feet.  Diffuse freckling with mild hyperkeratotic skin in these areas. Femoral pulses 2+ bilat, PT pulses 2+ bilat. Rectal: normal tone.  No mass.  Prostate without palpable nodule or enlargement.  Nontender. Stool wipings were hemoccult negative today.   Impression & Recommendations:  Problem # 1:  HEALTH MAINTENANCE EXAM (ICD-V70.0) Assessment New Discussed importance of prudent diet, increase exercise, smoking cessation.  He does not want cessation aid at this time. Will get fasting labs ASAP--orders given to pt to have these done at [facility] in [place]. Orders: FLU VACC SPLIT 3 YRS & > IM FLUZONE (Z3086) Admin 1st Vaccine (57846)  Problem # 2:  HEMATURIA, HX OF (ICD-V13.09) Assessment: New UA today was normal.  Will check PSA and Cr.  If these wnl then will do no further diagnostic w/u at this time, but will rescreen urine periodically.  Orders: UA Dipstick w/o Micro (automated)  (81003)  Problem # 3:  SPECIAL SCREENING FOR MALIGNANT NEOPLASMS COLON (ICD-V76.51) Assessment: New Hemoccult x 1 neg today. Will obtain old  GI records from [facility] and then set him up with [facility] as appropriate.  Orders: Hemoccult Guaiac-1 spec.(in office) (82270)  Problem # 4:  SPECIAL SCREENING MALIGNANT NEOPLASM OF PROSTATE (ICD-V76.44) Assessment: New PSA to be done with labs ASAP. Exam normal today.  Problem # 5:  CORONARY ARTERY DISEASE (ICD-414.00) Assessment: Unchanged Obtain [facility] records and get him set up with [facility] cardiology as appropriate.  His updated medication list for this problem includes:    Bayer Aspirin 325 Mg Tabs (Aspirin) .Marland Kitchen... Take 1 tablet by mouth once a day    Lisinopril 20 Mg Tabs (Lisinopril) .Marland Kitchen... 1  by mouth two times a day    Lasix 40 Mg Tabs (Furosemide) .Marland Kitchen... Take 1 tablet by mouth once a day as needed    Doxazosin Mesylate 2 Mg Tabs (Doxazosin mesylate) .Marland Kitchen... 1 by mouth daily    Hydrochlorothiazide 25 Mg Tabs (Hydrochlorothiazide) .Marland Kitchen... 1 by mouth daily    Bisoprolol-hydrochlorothiazide 5-6.25 Mg Tabs (Bisoprolol-hydrochlorothiazide) ..... Once daily  Problem # 6:  EMPHYSEMA (ICD-492.8) Assessment: Unchanged He's continuing with participation in study drug (fluticasone/vilanterol inhalation powder) through [employer] (Dr. Francesca Oman) until 2014. Strongly encouraged smoking cessation.  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  Complete Medication List: 1)  Bayer Aspirin 325 Mg Tabs (Aspirin) .... Take 1 tablet by mouth once a day 2)  Lisinopril 20 Mg Tabs (Lisinopril) .Marland Kitchen.. 1 by mouth two times a day 3)  Lasix 40 Mg Tabs (Furosemide) .... Take 1 tablet by mouth once a day as needed 4)  Doxazosin Mesylate 2 Mg Tabs (Doxazosin mesylate) .Marland Kitchen.. 1 by mouth daily 5)  Hydrochlorothiazide 25 Mg Tabs (Hydrochlorothiazide) .Marland Kitchen.. 1 by mouth daily 6)  Lovastatin 20 Mg Tabs (Lovastatin) .Marland Kitchen.. 1 tab by mouth bid 7)  Guaifenesin 200 Mg Tabs (Guaifenesin) .... As needed 8)  Proair Hfa 108 (90 Base) Mcg/act Aers (Albuterol sulfate) .... 2 puffs every 4-6 hours as needed 9)  Citalopram Hydrobromide 40 Mg Tabs (Citalopram hydrobromide) .... Once daily 10)  Bisoprolol-hydrochlorothiazide 5-6.25 Mg Tabs (Bisoprolol-hydrochlorothiazide) .... Once daily 11)  Cyclobenzaprine Hcl 10 Mg Tabs (Cyclobenzaprine hcl) .... Once daily 12)  Albuterol Sulfate (2.5 Mg/80ml) 0.083% Nebu (Albuterol sulfate) .... Q 4 hrs as needed 13)  Triamcinolone Acetonide 0.5 % Crea (Triamcinolone acetonide) .... Apply two times a day to affected areas as needed  Other Orders: T-Lipid Profile (13086-57846) T-Comprehensive Metabolic Panel [id number]) T-CBC w/Diff  (24401-02725) T-PSA (36644-03474) T-TSH [phone]) T-Testosterone; Total [phone]) Flu Vaccine 32yrs + (16606)  Patient Instructions: 1)  Sign papers so we can get your records from [facility] and [facility] cardiology. 2)  Take your lab orders with you to [facility] lab on [address] [facility]) for fasting blood tests. 3)  Follow up in 6 months. Prescriptions: LOVASTATIN 20 MG TABS (LOVASTATIN) 1 tab by mouth bid  #180 x 1   Entered and Authorized by:   Michell Heinrich M.D.   Signed by:   Michell Heinrich M.D. on 08/21/2010   Method used:   Electronically to        [facility] [phone]* (retail)       [address] [place], [place]  [address]       Ph: [phone]       Fax: [phone]   RxID:   [id number] CYCLOBENZAPRINE HCL 10 MG TABS (CYCLOBENZAPRINE HCL) once daily  #90 x 1  Entered and Authorized by:   Michell Heinrich M.D.   Signed by:   Michell Heinrich M.D. on 08/21/2010   Method used:   Electronically to        [facility] [phone]* (retail)       [address] [place], [place]  [address]       Ph: [phone]       Fax: [phone]   RxID:   [id number] BISOPROLOL-HYDROCHLOROTHIAZIDE 5-6.25 MG TABS (BISOPROLOL-HYDROCHLOROTHIAZIDE) once daily  #90 x 1   Entered and Authorized by:   Michell Heinrich M.D.   Signed by:   Michell Heinrich M.D. on 08/21/2010   Method used:   Electronically to        [facility] [phone]* (retail)       [address] [place], [place]  [address]       Ph: [phone]       Fax: [phone]   RxID:   [id number] CITALOPRAM HYDROBROMIDE 40 MG TABS (CITALOPRAM HYDROBROMIDE) once daily  #90 x 1   Entered and Authorized by:   Michell Heinrich M.D.   Signed by:   Michell Heinrich M.D. on 08/21/2010   Method used:   Electronically to        [facility] [phone]* (retail)       [address] [place], [place]  [address]       Ph: [phone]       Fax: [phone]    RxID:   [id number] PROAIR HFA 108 (90 BASE) MCG/ACT  AERS (ALBUTEROL SULFATE) 2 puffs every 4-6 hours as needed  #1 x 6   Entered and Authorized by:   Michell Heinrich M.D.   Signed by:   Michell Heinrich M.D. on 08/21/2010   Method used:   Electronically to        [facility] [phone]* (retail)       [address] [place], [place]  [address]       Ph: [phone]       Fax: [phone]   RxID:   [id number] HYDROCHLOROTHIAZIDE 25 MG TABS (HYDROCHLOROTHIAZIDE) 1 by mouth daily  #90 x 1   Entered and Authorized by:   Michell Heinrich M.D.   Signed by:   Michell Heinrich M.D. on 08/21/2010   Method used:   Electronically to        [facility] [phone]* (retail)       [address] [place], [place]  [address]       Ph: [phone]       Fax: [phone]   RxID:   [id number] DOXAZOSIN MESYLATE 2 MG TABS (DOXAZOSIN MESYLATE) 1 by mouth daily  #90 x 1   Entered and Authorized by:   Michell Heinrich M.D.   Signed by:   Michell Heinrich M.D. on 08/21/2010   Method used:   Electronically to        [facility] [phone]* (retail)       [address] [place], [place]  [address]       Ph: [phone]       Fax: [phone]   RxID:   [id number] LISINOPRIL 20 MG TABS (LISINOPRIL) 1  by mouth two times a day  #180 x 1   Entered and Authorized by:   Michell Heinrich M.D.   Signed by:   Michell Heinrich M.D. on 08/21/2010   Method used:   Electronically to        [facility] [phone]* (retail)       [address] [place], [place]  [address]       Ph: [phone]       Fax: [phone]   RxID:   [id number]    Orders Added: 1)  T-Lipid Profile [phone] 2)  T-Comprehensive Metabolic Panel [80053-22900] 3)  T-CBC w/Diff [36644-03474] 4)  T-PSA [25956-38756] 5)  T-TSH [43329-51884] 6)  T-Testosterone; Total [phone] 7)  Hemoccult Guaiac-1 spec.(in office) [82270] 8)  UA Dipstick w/o Micro  (automated)  [81003] 9)  Est. Patient Level II [99212] 10)  FLU VACC SPLIT 3 YRS & > IM FLUZONE [Q2038] 11)  Admin 1st Vaccine [90471] 12)  Est. Patient 40-64 years [99396] 40)  Flu Vaccine 77yrs + [10932]   Immunizations Administered:  Influenza Vaccine # 1:    Vaccine Type: Fluvax 3+    Site: left deltoid    Mfr: [name] Pasteur    Dose: 0.5 ml    Route: IM    Given by: Francee Piccolo [name] (AAMA)    Exp. Date: 01/11/2011    Lot #: [id number]    VIS given: 02/05/10 version given August 21, 2010.  Flu Vaccine Consent Questions:    Do you have a history of severe allergic reactions to this vaccine? no    Any prior history of allergic reactions to egg and/or gelatin? no    Do you have a sensitivity to the preservative Thimersol? no    Do you have a past history of Guillan-Barre Syndrome? no    Do you currently have an acute febrile illness? no    Have you ever had a severe reaction to latex? no    Vaccine information given and explained to patient? yes   Immunizations Administered:  Influenza Vaccine # 1:    Vaccine Type: Fluvax 3+    Site: left deltoid    Mfr: [name] Pasteur    Dose: 0.5 ml    Route: IM    Given by: Francee Piccolo [name] (AAMA)    Exp. Date: 01/11/2011    Lot #: [id number]    VIS given: 02/05/10 version given August 21, 2010.  Laboratory Results   Urine Tests  Date/Time Received: August 21, 2010 9:35 AM Date/Time Reported: August 21, 2010 9:35 AM  Routine Urinalysis   Color: yellow Appearance: Clear Glucose: negative   (Normal Range: Negative) Bilirubin: negative   (Normal Range: Negative) Ketone: negative   (Normal Range: Negative) Spec. Gravity: <1.005   (Normal Range: 1.003-1.035) Blood: negative   (Normal Range: Negative) pH: 6.0   (Normal Range: 5.0-8.0) Protein: negative   (Normal Range: Negative) Urobilinogen: 0.2   (Normal Range: 0-1) Nitrite: negative   (Normal Range: Negative) Leukocyte Esterace: negative   (Normal Range:  Negative)

## 2010-08-29 NOTE — Progress Notes (Signed)
Summary: Medical Record Request  Phone Note Other Incoming   Summary of Call: Please request records from [facility] and [facility] cardiology. Initial call taken by: Michell Heinrich M.D.,  August 21, 2010 11:17 AM  Follow-up for Phone Call        Erie Noe is sending a fax request to both practices Follow-up by: Lannette Donath,  August 21, 2010 12:08 PM

## 2010-09-02 ENCOUNTER — Encounter: Payer: Self-pay | Admitting: Family Medicine

## 2010-09-04 NOTE — Progress Notes (Signed)
     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  s/p PCI with stent (pt reports 20+ interventions in the past, most recent cath 6/08 showed patent stents).  Normal LV function. Nuclear stress test NEG 8/09. COPD Depression (?bipolar dx by psychiatrist?) Anxiety Hypogonadism and erectile dysfunction Morbid obesity Colon polyp (ganglioneuroma), mild diverticulosis: TCS 2006--Dr. Luther Parody...consider repeat in 5 yrs.

## 2010-09-04 NOTE — Progress Notes (Signed)
Summary: [facility]  [facility]   Imported By: Lester Nunez 08/27/2010 08:17:04  _____________________________________________________________________  External Attachment:    Type:   Image     Comment:   External Document

## 2010-09-04 NOTE — Progress Notes (Signed)
Summary: lab results  Phone Note Other Incoming   Summary of Call: Pls add on prolactin and LH level to labs done last week. Let pt know that his labs all looked good with a few exceptions: his good cholesterol is a little low and his triglyceride level is a little high. He was on niacin in the past for this and I would recommend he get back on it.  See if he recalls why he stopped the niacin.  There are other medicines as options in case he can't tolerate niacin. Also, his PSA level came back SLIGHTLY high (4).  However, it was up quite a bit from the check in 2009.  This RISE in PSA level in the last 2 yrs is a little concerning and I want him to see a urologist for evaluation.  Tell him sometimes it is simply from enlarged prostate gland, but we need to do this eval to make sure he doesn't have prostate cancer (esp since he noted some blood spots on his underwear lately that we can't explain...).  His testosterone level also came back low, but we'll wait on considering any replacement for this until his prostate/urology evaluation is done. Initial call taken by: Michell Heinrich M.D.,  August 26, 2010 8:36 AM  Follow-up for Phone Call        labs added per Aurther Loft at [place]. Francee Piccolo [name] Duncan Dull)  August 26, 2010 8:57 AM   LM to [facility] at home number Francee Piccolo [name] Duncan Dull)  August 26, 2010 3:25 PM   notified of above.  Pt agreeable with above plan.  He will restart Niacin that he has at home.  I encouraged healthier diet and exercise.  Pt will be out of town 2/24-3/3 and will be unable to have uro appt during that time.  Pt aware that we will not be treating low testosterone until he sees urologist. Follow-up by: Francee Piccolo [name] Duncan Dull),  August 26, 2010 4:58 PM  New Problems: ELEVATED PROSTATE SPECIFIC ANTIGEN (ICD-790.93)   New Problems: ELEVATED PROSTATE SPECIFIC ANTIGEN (ICD-790.93)

## 2010-09-05 ENCOUNTER — Encounter: Payer: Self-pay | Admitting: Family Medicine

## 2010-09-10 NOTE — Miscellaneous (Signed)
  Clinical Lists Changes  Observations: Added new observation of PAST MED HX: Obstructive sleep apnea (RestMed S6 machine, set at 4.6-6.4) DEGENERATIVE JOINT DISEASE (ICD-715.90) HYPERCHOLESTEROLEMIA (ICD-272.0) HYPERTENSION (ICD-401.9) CORONARY ARTERY DISEASE (ICD-414.00)--s/p MI 2005.  s/p PCI with stents to RCA x 2 and circumflex x 1 (pt reports 20+ interventions in the past, most recent cath 6/08 showed patent stents and normal LV function).  Nuclear stress test NEG 8/09. COPD Depression (?bipolar dx by psychiatrist?) Anxiety Hypogonadism and erectile dysfunction Morbid obesity Colon polyp (ganglioneuroma), mild diverticulosis: TCS 2006--Dr. Luther Parody...consider repeat in 5 yrs. (09/02/2010 16:57) Added new observation of PRIMARY MD: Elizebeth Brooking [name] M.D. (09/02/2010 16:57) Added new observation of PNEUMOVAX: given (02/12/2004 17:12)       Past History:  Past Medical History: Obstructive sleep apnea (RestMed S6 machine, set at 4.6-6.4) DEGENERATIVE JOINT DISEASE (ICD-715.90) HYPERCHOLESTEROLEMIA (ICD-272.0) HYPERTENSION (ICD-401.9) CORONARY ARTERY DISEASE (ICD-414.00)--s/p MI 2005.  s/p PCI with stents to RCA x 2 and circumflex x 1 (pt reports 20+ interventions in the past, most recent cath 6/08 showed patent stents and normal LV function).  Nuclear stress test NEG 8/09. COPD Depression (?bipolar dx by psychiatrist?) Anxiety Hypogonadism and erectile dysfunction Morbid obesity Colon polyp (ganglioneuroma), mild diverticulosis: TCS 2006--Dr. Luther Parody...consider repeat in 5 yrs.   Allergies: 1)  ! Zetia (Ezetimibe) 2)  ! Elavil    Preventive Care Screening  Last Pneumovax:    Date:  02/12/2004    Results:  given

## 2010-09-10 NOTE — Miscellaneous (Signed)
  Clinical Lists Changes  Observations: Added new observation of PRIMARY MD: Michell Heinrich M.D. (09/02/2010 8:24) Added new observation of COLONOSCOPY: Adenomatous Polyp (12/12/2004 8:26)        Preventive Care Screening  Colonoscopy:    Date:  12/12/2004    Results:  Adenomatous Polyp

## 2010-09-10 NOTE — Procedures (Signed)
Summary: Colon/Peter Santogade MD  Colon/Peter Santogade MD   Imported By: Lester [place] 09/02/2010 08:18:27  _____________________________________________________________________  External Attachment:    Type:   Image     Comment:   External Document

## 2010-09-10 NOTE — Letter (Signed)
Summary: Mingo Amber MD  Mingo Amber MD   Imported By: Lester [place] 09/02/2010 08:11:06  _____________________________________________________________________  External Attachment:    Type:   Image     Comment:   External Document

## 2010-09-12 DIAGNOSIS — C61 Malignant neoplasm of prostate: Secondary | ICD-10-CM | POA: Insufficient documentation

## 2010-09-19 ENCOUNTER — Encounter: Payer: Self-pay | Admitting: Family Medicine

## 2010-09-24 NOTE — Consult Note (Signed)
Summary: [facility]  [facility]   Imported By: Maryln Gottron 09/16/2010 12:29:03  _____________________________________________________________________  External Attachment:    Type:   Image     Comment:   External Document

## 2010-09-27 ENCOUNTER — Other Ambulatory Visit (HOSPITAL_COMMUNITY): Payer: Self-pay | Admitting: Urology

## 2010-09-27 DIAGNOSIS — C61 Malignant neoplasm of prostate: Secondary | ICD-10-CM

## 2010-10-04 ENCOUNTER — Ambulatory Visit (HOSPITAL_COMMUNITY)
Admission: RE | Admit: 2010-10-04 | Discharge: 2010-10-04 | Disposition: A | Discharge status: Home / Self Care | Payer: 59 | Source: Ambulatory Visit | Attending: Urology | Admitting: Urology

## 2010-10-04 ENCOUNTER — Encounter (HOSPITAL_COMMUNITY): Payer: Self-pay

## 2010-10-04 ENCOUNTER — Encounter (HOSPITAL_COMMUNITY)
Admission: RE | Admit: 2010-10-04 | Discharge: 2010-10-04 | Disposition: A | Discharge status: Home / Self Care | Payer: 59 | Source: Ambulatory Visit | Attending: Urology | Admitting: Urology

## 2010-10-04 DIAGNOSIS — C61 Malignant neoplasm of prostate: Secondary | ICD-10-CM | POA: Insufficient documentation

## 2010-10-04 MED ORDER — TECHNETIUM TC 99M MEDRONATE IV KIT
24.0000 | PACK | Freq: Once | INTRAVENOUS | Status: AC | PRN
  Administered 2010-10-04: 24 via INTRAVENOUS

## 2010-10-15 ENCOUNTER — Encounter: Payer: Self-pay | Admitting: Family Medicine

## 2010-10-15 ENCOUNTER — Ambulatory Visit: Payer: 59 | Attending: Radiation Oncology | Admitting: Radiation Oncology

## 2010-10-15 DIAGNOSIS — Z510 Encounter for antineoplastic radiation therapy: Secondary | ICD-10-CM | POA: Insufficient documentation

## 2010-10-15 DIAGNOSIS — C61 Malignant neoplasm of prostate: Secondary | ICD-10-CM | POA: Insufficient documentation

## 2010-10-22 LAB — CBC
Hemoglobin: 15.8 g/dL (ref 13.0–17.0)
MCHC: 34.1 g/dL (ref 30.0–36.0)
RBC: 5.25 MIL/uL (ref 4.22–5.81)
WBC: 7.7 10*3/uL (ref 4.0–10.5)

## 2010-10-22 LAB — DIFFERENTIAL
Basophils Relative: 2 % — ABNORMAL HIGH (ref 0–1)
Lymphocytes Relative: 16 % (ref 12–46)
Lymphs Abs: 1.2 10*3/uL (ref 0.7–4.0)
Monocytes Absolute: 0.7 10*3/uL (ref 0.1–1.0)
Monocytes Relative: 10 % (ref 3–12)
Neutro Abs: 5.5 10*3/uL (ref 1.7–7.7)
Neutrophils Relative %: 70 % (ref 43–77)

## 2010-10-22 LAB — BASIC METABOLIC PANEL
CO2: 31 mEq/L (ref 19–32)
Calcium: 8.5 mg/dL (ref 8.4–10.5)
Creatinine, Ser: 0.8 mg/dL (ref 0.4–1.5)
GFR calc Af Amer: >60 mL/min (ref >60)
GFR calc non Af Amer: >60 mL/min (ref >60)
Sodium: 141 mEq/L (ref 135–145)

## 2010-11-26 NOTE — H&P (Signed)
NAME:  Scott Rollins, Scott Rollins NO.:  [id number]   MEDICAL RECORD NO.:  [id number]          PATIENT TYPE:  INP   LOCATION:  3730                         FACILITY:  [facility]   PHYSICIAN:  Georga Hacking, M.D.DATE OF BIRTH:  09-06-1950   DATE OF ADMISSION:  03/06/2008  DATE OF DISCHARGE:                              HISTORY & PHYSICAL   CODE STATUS:  The patient is full code.   PRIMARY CARE PHYSICIAN:  Dr. Donnel Saxon.   CARDIOLOGIST:  Dr. Garnette Scheuermann.   The patient is a good historian.  He is full code.  Total visit time  approximately 64 minutes.   CHIEF COMPLAINTS:  Chest pain.   HISTORY OF PRESENT ILLNESS:  Scott Rollins is a 60 year old male with a  history of coronary artery disease status post multiple PCIs in the past  to the circumflex and right coronary system.  Last left heart cath was  in June 2008 for unstable angina.  He had an ejection fraction of 60% on  LV gram at that time with patent proximal with mid stent to the RCA with  a gap that was 50-60% stenosed between the proximal and mid stents.  Multiple luminal irregularities in the RCA, in between the PDA and a  larger of the second distal LV branch, he had a 50-60% stenosis.  Left  main was normal D1 branch.  He had a sub branch of the diagonal-1 that  was 90% stenosed and 70% stenosis of the diagonal distal to that branch  and the main diagonal-1 vessel.  Circumflex stent was patent, and above  in the stent he had a 30-40% lesion.  The patient had done well over the  past year, but notes over the last 2 weeks he had shortness of breath  with associated chest pain in the left upper chest radiating to the left  arm, left neck, and left jaw, particularly with exertion.  The patient  notes walking 7-8 steps, he becomes short of breath.  The patient notes  on today, however, he noted having chest pain at rest which actually  began on the last night.  He took three sublingual nitroglycerins on the  last  night.  On today, he had chest pain at resting and required three  sublingual nitroglycerin this morning, March 06, 2008.  Had chest pain  again in the afternoon and then presented to the emergency room.  The  patient's son drove him into the emergency room.  He was given heparin  drip, nitroglycerin drip, morphine, chest pain went from an 8/10 to a  0/10.  The patient notes he has had chronic lower extremity edema which  has worsened over the last week.   PAST MEDICAL HISTORY:  1. Coronary artery disease as above.  2. History of hyperlipidemia.  3. History of hypertension.  4. History of obesity.  5. History of obstructive sleep apnea on CPAP.  6. History of restless leg syndrome.  7. History of depression.  8. History of migraine headaches.  9. History of tobacco abuse.   ALLERGIES:  ELAVIL CAUSES A HEADACHE AND ANXIETY.  ZETIA  CAUSES FATIGUE.   MEDICATIONS:  1. Aspirin 325 mg p.o. daily.  2. Coreg 4.5 mg twice a day.  Out of this medicines for the last 3-4      days.  3. Lovastatin 40 mg daily.  4. Flexeril as needed 10 mg three times a day.  5. Celexa 40 mg daily.  6. Lisinopril 40 mg daily.  7. Lasix p.r.n.  8. Cardura 2 mg daily.   SOCIAL HISTORY:  He does smoke and has done so for the last 45 years,  half pack to a pack a day.  No alcohol.  No illicit drug use.  He works  at the [employer].  He is married.   FAMILY HISTORY:  Positive for early coronary artery disease in his  brother and his father.   REVIEW OF SYSTEMS:  A 14-point review of systems negative unless stated  above.   PHYSICAL EXAMINATION:  VITAL SIGNS:  Temperature 98.2, pulse 93,  respiratory rate 16, blood pressure initially 182/97, sats of 98% on  room air.  GENERAL:  He is an obese male, sitting upright in bed in no acute  distress.  HEENT:  Normocephalic, atraumatic.  Pupils eight, round and reactive to  light with extraocular movements being intact.  The oropharynx shows no  posterior  pharyngeal lesions.  NECK:  Supple with no lymphadenopathy or thyromegaly.  No jugular venous  distention.  No carotid bruits.  CARDIOVASCULAR:  Regular rhythm and rate.  Distant normal S1-S2.  No S3  or S4.  No murmurs, rubs or gallops.  LUNGS:  Clear to auscultation bilaterally.  SKIN:  Shows no rashes.  ABDOMEN:  Significantly obese, soft, nontender, nondistended.  No  hepatosplenomegaly.  Normoactive bowel sounds.  EXTREMITIES:  Show no clubbing, cyanosis.  There is trace lower  extremity edema with chronic venous stasis changes.  NEUROLOGICAL:  He is alert and oriented x4.  Cranial nerves II-XII  grossly intact.  Strength and sensation grossly intact.   STUDIES:  X-ray showed cardiomegaly with poor inspiratory effort with no  major change from June 2008 chest x-ray.  EKG showed normal sinus rhythm  at a rate of 98 beats per minute with normal axis.  He has RSR  configuration in V1-V2, PR interval 164, QRS 90, QT corrected 506 with  no change in EKG compared to January 07, 2007.   LABORATORY DATA:  White count of 8.2, with a hemoglobin of 16.2,  platelets 244.  Sodium of 142, potassium 3.9, chloride 103, bicarb 29,  BUN 20, creatinine 1.0, glucose of 88, first set of cardiac markers at  1750 were negative, calcium was 9.5.   ASSESSMENT/PLAN:  This is a patient with history of significant coronary  artery disease who continues to smoke.  Has recently been off his Coreg  now with possible unstable angina.  We will continue his heparin drip,  aspirin and nitrates.  He will be continued on lisinopril.  Will check  his lipids and continue his statin will counsel concerning  discontinuation of tobacco and will hold n.p.o. after midnight for  possible evaluation of his coronary vasculature.  We will resume his  Coreg.  He has been having headaches.  He has a history of migraine  headaches, give Vicodin as needed.  For his obstructive sleep apnea, he  will be on CPAP tonight.  He has not  been very compliant with this.  Patient notes he has been wheezing some here recently.  Will give him  Atrovent nebulizers and oxygen.  For his GI prophylaxis, will be with  proton pump inhibitor.  He is on heparin for DVT prophylaxis.  For his  elevated blood pressure, he is on nitro drip and will titrate as needed.      Darryl D. Prime, MD   Electronically Signed     ______________________________  Lacretia Nicks. Viann Fish, M.D.    DDP/MEDQ  D:  03/07/2008  T:  03/07/2008  Job:  [id number]

## 2010-11-26 NOTE — Discharge Summary (Signed)
NAME:  [name], PARCEL NO.:  [id number]   MEDICAL RECORD NO.:  [id number]          PATIENT TYPE:  INP   LOCATION:  3730                         FACILITY:  [facility]   PHYSICIAN:  Lyn Records, M.D.   DATE OF BIRTH:  [birth year]   DATE OF ADMISSION:  03/06/2008  DATE OF DISCHARGE:  03/09/2008                               DISCHARGE SUMMARY   DISCHARGE DIAGNOSES:  1. Chest pain, resolved.  2. Hypertension, medications adjusted.  3. Hyperlipidemia, treated.  4. Obstructive sleep apnea, uses continuous positive airway pressure.  5. Gastroesophageal reflux disease.  6. Known coronary artery disease.   HOSPITAL COURSE:  Scott Rollins is a 60 year old male patient of Dr.  Katrinka Blazing who has had known coronary artery disease with multiple  interventions of the right coronary artery and circumflex.  He was  admitted with aggravating chest pain.  His cardiac enzymes were  essentially normal.  We did perform a 2-day adenosine Cardiolite and  this was normal with no evidence of ischemia.   His blood pressure was elevated during the hospital stay and  hydrochlorothiazide was added to his medication regimen.  Otherwise, his  medications were changed.   He is discharged to home in stable but improved condition.   His medications are as follows:  1. Hydrochlorothiazide 25 mg one-half tablet p.o. daily.  2. Sublingual nitroglycerin p.r.n. chest pain.  3. Flexeril 10 mg 1 p.o. at bedtime p.r.n.  4. Cardura 2 mg at bedtime.  5. Celexa 40 mg a day.  6. Lisinopril 20 mg 2 tablets daily.  7. Carvedilol 12.5 mg twice a day.  8. Lovastatin 20 mg 2 tablets daily.   Remain on a low-sodium heart-healthy diet.  Increase activity slowly.  Follow up with Dr. Dellia Cloud, nurse practitioner on March 22, 2008, at 9:50 a.m.      Guy Franco, P.A.      Lyn Records, M.D.  Electronically Signed    LB/MEDQ  D:  03/09/2008  T:  03/10/2008  Job:  [id number]   cc:   Katrina Stack, FNP

## 2010-11-26 NOTE — Cardiovascular Report (Signed)
NAME:  Scott Rollins, Scott Rollins NO.:  [id number]   MEDICAL RECORD NO.:  [id number]          PATIENT TYPE:  OBV   LOCATION:  6532                         FACILITY:  [facility]   PHYSICIAN:  Lyn Records, M.D.   DATE OF BIRTH:  03/29/1951   DATE OF PROCEDURE:  01/08/2007  DATE OF DISCHARGE:                            CARDIAC CATHETERIZATION   INDICATIONS FOR PROCEDURE:  Progressive symptoms of somewhat nonspecific  in a patient with multivessel coronary artery disease and multiple prior  stents, history of sleep apnea, not a good candidate for myocardial  perfusion imaging because of obesity.  He had a minimally elevated CK-  MB.  The study is being done to rule out progression of coronary artery  disease.   PROCEDURE:  1. Left heart catheterization.  2. Selective coronary angiography.  3. Left ventriculography.  4. Angio-Seal.   DESCRIPTION OF PROCEDURE:  After informed consent, a 6 French sheath was  placed in the right femoral artery using the modified Seldinger  technique.  The patient did receive 2 mg of IV Versed.  1% Xylocaine  local infiltration was also given.  A 6 French A2 multipurpose was used  for hemodynamic recordings and left ventriculography by hand injection.  Right coronary angiography was performed with a 6 French #4 right  Judkins and left coronary angiography with a 6 French #4 left Judkins.  Angio-Seal was used for closure with good hemostasis.   RESULTS:   HEMODYNAMIC DATA:  1. Aortic pressure 132/86.  2. Left ventricular pressure 132/24.   LEFT VENTRICULOGRAPHY:  LV cavity is normal in size.  Contractility is  normal.  EF is estimated to be 60%.   CORONARY ANGIOGRAPHY:  The right coronary artery is a moderate size  vessel that has been previously stented.  There is a patent stent in the  mid vessel.  There is a patent stent in the proximal vessel.  There is a  gap between the proximal and mid vessel that contains 50-60% narrowing.  Multiple  luminal irregularities are noted throughout the right coronary  artery.  No high grade obstruction is felt to be present.  Between the  PDA and the larger of two distal left ventricular branches contains a 50-  60% stenosis.  No high grade obstruction was noted in the right  coronary.   Left main coronary artery.  Widely patent.   Left anterior descending coronary artery.  The LAD contains a large  branching first diagonal, a subbranch of this diagonal contains a 95%  stenosis.  There is 70% stenosis in the main channel of the diagonal  beyond its bifurcation with this small branch.  No high grade  obstruction is seen in the LAD proper, although distally there is  diffuse luminal irregularity.   Circumflex artery.  The circumflex gives origin to three obtuse  marginals.  There is a patent stent in the circumflex.  Above the stent  and within the stent there is a region of 30-40% narrowing.  No high  grade obstruction is seen.  The continuation of the circumflex  bifurcates after the origin of a distal third  obtuse marginal branch.   CONCLUSION:  1. The patient has severe diffuse coronary artery disease with high      grade obstruction in a subbranch of the first diagonal.  There      diffuse luminal irregularity noted in the distal left anterior      descending.  There is moderate mid circumflex and mid right      coronary artery stenosis.  No high grade obstruction is noted in      the main coronary beds.  2. Normal left ventricular function.   RECOMMENDATIONS:  Continue aggressive risk factor modification, weight  loss, CPAP for sleep apnea, clinical follow-up in 4-6 weeks.  He is to  call if any chest discomfort.      Lyn Records, M.D.  Electronically Signed     HWS/MEDQ  D:  01/08/2007  T:  01/08/2007  Job:  [id number]   cc:   [facility]

## 2010-11-26 NOTE — Discharge Summary (Signed)
NAME:  Scott Rollins, Scott Rollins NO.:  [id number]   MEDICAL RECORD NO.:  [id number]          PATIENT TYPE:  OBV   LOCATION:  6532                         FACILITY:  [facility]   PHYSICIAN:  Lyn Records, M.D.   DATE OF BIRTH:  12/21/1950   DATE OF ADMISSION:  01/07/2007  DATE OF DISCHARGE:  01/08/2007                               DISCHARGE SUMMARY   DISCHARGE DIAGNOSES:  1. Chest pain resolved.  2. Coronary artery disease, medical management.  3. Tobacco abuse, smoking cessation counseling provided.  4. Hyperlipidemia treated.  5. Hypertension treated.  6. Migraines.  7. Obesity.  8. Sleep apnea.   HOSPITAL COURSE:  Mr. Hardenbrook was admitted to [facility] on January 07, 2007, with progressive angina.  Ultimately he underwent cardiac  catheterization the following day and was found to have stents that were  patent.  He had a 95% stenosis of the first diagonal branch, otherwise  nonobstructive disease.  He was Angio-Sealed and discharged to home on  the same day.   LABORATORY DATA:  Pro time 12.7, INR 0.9, point of care markers x2 were  negative.  CK-MB 182 and MB function of 6.7, troponin was 0.01.  Essentially cardiac enzymes were negative.   His chest x-ray showed stable cardiomegaly, no active disease.  His EKG  showed normal sinus rhythm, rate of 77, with no acute ST-T wave changes.   DISPOSITION:  The patient was discharged to home in stable and improved  condition.   MEDICATIONS:  1. Enteric coated aspirin 325 mg daily.  2. Ramipril 10 mg daily.  3. Coreg 12.5 mg twice a day.  4. Digoxin 0.125 mg daily.  5. Crestor 10 mg daily.  6. Niacin 500 mg daily.  7. Nitroglycerin p.r.n. chest pain.   DISCHARGE INSTRUCTIONS:  The patient is to clean the cath site with soap  and water, no scrubbing.  Remain on a low sodium, heart healthy diet.  Increase activity slowly.  No lifting over 10 pounds for 1 week.  No  driving for 2 days.  He is encouraged to stop  smoking.   FOLLOWUP:  Followup with Dr. Katrinka Blazing on January 20, 2007, at 10:10 a.m. and  with Dr. Katrinka Blazing on March 02, 2007 at 2 p.m.  The patient was given a  prescription for sublingual nitroglycerin as needed for chest pain.  To  call for any questions or concerns.      Guy Franco, P.A.      Lyn Records, M.D.  Electronically Signed    LB/MEDQ  D:  01/08/2007  T:  01/09/2007  Job:  [id number]

## 2010-11-26 NOTE — H&P (Signed)
NAME:  Scott Rollins, Scott Rollins NO.:  [id number]   MEDICAL RECORD NO.:  [id number]          PATIENT TYPE:  OBV   LOCATION:  6532                         FACILITY:  [facility]   PHYSICIAN:  Francisca December, M.D.  DATE OF BIRTH:  [birth year]   DATE OF ADMISSION:  01/07/2007  DATE OF DISCHARGE:                              HISTORY & PHYSICAL   REASON FOR ADMISSION:  Progressive angina.   HISTORY OF PRESENT ILLNESS:  Scott Rollins is a nice, 60 year old man  with a 2-week history of intermittent worsening shortness of breath and  ankle edema for which he has been taking up to 80 mg of furosemide twice  daily, generally with good results.  Over the past few days, he began  having spontaneous episodes of chest tightness and heaviness which  radiates with pain into the neck and left arm.  He had a severe episode  today associated with diaphoresis and shortness of breath.  He has taken  nitroglycerin for this with relief.  He also chews aspirin.  He finally  came to the emergency room with 7/10 pain today.  He was given topical  nitroglycerin paste and has only a minimal amount of discomfort at this  time.   His cardiac history is extensive and he is status post multiple stents  in the right coronary and left circumflex.  Cardiac catheterization  performed in September 2006, showed the stents to be widely patent, but  there was significant distal disease and medical therapy only was  anticipated.   PAST MEDICAL HISTORY:  1. Coronary artery disease, as described.  2. Hypertension.  3. Hyperlipidemia.  4. Migraines.  5. Obesity.  6. Sleep apnea.   CURRENT MEDICATIONS:  1. Aspirin 325 mg p.o. daily.  2. Ramipril 10 mg p.o. daily.  3. Coreg 12.5 mg p.o. b.i.d.  4. Digoxin 0.125 mg p.o. daily.  5. Crestor 10 mg p.o. daily.  6. Niacin 500 mg p.o. daily.   ALLERGIES:  No known drug allergies.   SOCIAL HISTORY:  He smokes about a half a pack of cigarettes a day.  No  alcohol.   He is employed.   FAMILY HISTORY:  Noncontributory.   PHYSICAL EXAMINATION:  VITAL SIGNS:  Blood pressure is 123/74, pulse 80  and regular, respiratory rate 18, temperature 97.9, O2 saturation is 96-  100% on 2 L.  GENERAL:  This is an obese, pleasant, Caucasian male in no distress.  HEENT:  Unremarkable.  NECK:  Supple without thyromegaly or masses.  The carotid strokes are  normal.  There is no bruit.  There is no JVD.  CHEST:  Diminished breath sounds throughout, reduced excursion.  No  wheezes, rales or rhonchi.  HEART:  A regular rhythm.  Heart tones somewhat muffled.  ABDOMEN:  Obese, soft, nontender.  Positive bowel sounds throughout.  GENITALIA:  Normal male phallus, descended testicles.  No lesions.  RECTAL:  Not performed.  EXTREMITIES:  Full range of motion.  No edema and diminished pedal  pulses bilaterally.  NEUROLOGIC:  Cranial nerves 2-12 intact.  Motor and sensory grossly  intact.  Gait not tested.  SKIN:  Warm, dry and clear.   LABORATORY DATA AND X-RAY FINDINGS:  Negative set of point of care  enzymes in the emergency room. The second set of isoenzymes in the  emergency room are also negative.  Prothrombin time is normal.  BNP is  30.  I do not have electrolytes or blood count on him at this time.   EKG is normal sinus rhythm, normal EKG.  The chest x-ray shows no active  cardiopulmonary disease.   ASSESSMENT:  1. Unstable angina, Class III-IV, progressively worsening.  2. History of extensive coronary disease.  3. Intermittent dyspnea in lower extremity edema without objective      evidence of congestive heart failure at this time.  4. Hypertension, adequate control.  5. Hyperlipidemia.  6. Obesity.  7. Sleep apnea.   PLAN:  The patient is admitted to the hospital on outpatient observation  status with the diagnosis of unstable angina.  He will receive  subcutaneous Lovenox twice daily, aspirin, continue with outpatient  medications, repeat CK-MB and  troponin x2, repeat ECG in the a.m.,  continuous telemetry monitoring, continued topical nitroglycerin and  cardiology p.r.n. orders.   He will be made n.p.o. in preparation for likely cardiac catheterization  tomorrow by Dr. Katrinka Blazing.      Francisca December, M.D.  Electronically Signed     JHE/MEDQ  D:  01/07/2007  T:  01/08/2007  Job:  [id number]

## 2010-11-29 NOTE — Discharge Summary (Signed)
NAME:  Scott Rollins, Scott Rollins                      ACCOUNT NO.:  [id number]   MEDICAL RECORD NO.:  [id number]                   PATIENT TYPE:  INP   LOCATION:  6525                                 FACILITY:  [facility]   PHYSICIAN:  Rollene Rotunda, M.D.                DATE OF BIRTH:  03-14-1951   DATE OF ADMISSION:  03/22/2003  DATE OF DISCHARGE:  03/24/2003                           DISCHARGE SUMMARY - REFERRING   PROCEDURES:  Coronary artery stenting on March 23, 2003.   REASON FOR ADMISSION:  Please refer to dictated admission note.   LABORATORY DATA:  Serial cardiac enzymes normal.  Lipid profile:  Total  cholesterol 195, triglycerides 172, HDL 32, LDL 129 (cholesterol/HDL ratio  6.1).  CBC normal at discharge.  Electrolytes and renal function normal at  discharge.   Admission chest x-ray:  No acute disease.   HOSPITAL COURSE:  Following presentation to the emergency room with symptoms  suggestive of unstable angina pectoris, in the context of known coronary  artery disease, the patient was admitted by Dr. Rollene Rotunda for rule out  myocardial infarction and further diagnostic evaluation.   Serial cardiac markers were all within normal limits.   The patient was placed on intravenous heparin and arrangements were made to  proceed with a diagnostic coronary angiogram.  The patient was enrolled in  the Acuity trial and randomized to Angiomax.   Cardiac catheterization, performed by Dr. Riley Kill (see report for full  details), notable for 90% distal RCA lesion.  Residual LAD and circumflex  arteries revealed no critical lesions.  Left ventricular function was  preserved (59%).   Dr. Riley Kill proceeded with successful stenting (Taxus) of the 90% RCA lesion  to 0% residual stenosis.  There were no noted complications.   The patient was kept for overnight observation and cleared for discharge the  following morning in hemodynamically stable condition.  Right groin was  stable on  examination, with no evidence of bruit.   The patient was referred for smoking cessation consult, and indicated that  he was motivated enough to quit on his own.   MEDICATION ADJUSTMENTS THIS ADMISSION:  1. Addition of Plavix (at least six months).  2. Pravachol.   DISCHARGE MEDICATIONS:  1. Plavix 75 mg q.d. (at least six months).  2. Coated aspirin 325 mg q.d.  3. Pravachol 40 mg q.d. (new).  4. Altace 10 mg b.i.d.  5. Verapamil 120 mg q.d.  6. Nitrostat 0.4 mg p.r.n.   INSTRUCTIONS:  1. No heavy lifting/driving x2 days.  2. Low fat, low cholesterol diet.  3. Call the office if there is any swelling, bleeding of the groin.  4. Stop smoking tobacco.   FOLLOWUP:  Dr. Loraine Leriche Pulsipher on Monday, April 17, 2003, at 12:15 p.m.   DISCHARGE DIAGNOSES:  1. Chest pain/known coronary artery disease.     a. Normal serial cardiac markers.     b. Status post stent (Taxus)  90% right coronary artery, March 23, 2003.     c. Normal left ventricular function.     d. Status post previous stenting right coronary artery (x2);        percutaneous transluminal coronary angioplasty left anterior        descending.  2. Mixed dyslipidemia.     a. Zocor intolerance.  3. Tobacco.  4. History of hypertension.      Gene Serpe, P.A. [facility]                      Rollene Rotunda, M.D.    GS/MEDQ  D:  03/24/2003  T:  03/25/2003  Job:  [id number]   cc:   Jenell Milliner, M.D.

## 2010-11-29 NOTE — Discharge Summary (Signed)
NAME:  Scott Rollins, Scott Rollins                      ACCOUNT NO.:  [id number]   MEDICAL RECORD NO.:  [id number]                   PATIENT TYPE:  OIB   LOCATION:  6526                                 FACILITY:  [facility]   PHYSICIAN:  Carole Binning, M.D. [facility]         DATE OF BIRTH:  02-May-1951   DATE OF ADMISSION:  03/05/2004  DATE OF DISCHARGE:  03/06/2004                                 DISCHARGE SUMMARY   PROCEDURES:  1. Cardiac catheterization.  2. Coronary arteriogram.  3. Left ventriculogram.  4. Intravascular ultrasound of the left anterior descending.  5. Percutaneous intervention and a TAXUS stent to 1 vessel.   HOSPITAL COURSE:  Scott Rollins is a 60 year old male with known coronary  artery disease.  He has had multiple stents.  His last intervention was a  TAXUS stent to the RCA in 2004.  He was seen in the office on February 26, 2004 and he had had a Cardiolite showing peri-infarct ischemia.  It was felt  that cardiac catheterization was indicated to further define this anatomy  and this was scheduled for March 05, 2004.   The cardiac catheterization showed an 80% proximal RCA lesion that was  treated with a PTCA and a TAXUS stent.  His other stents had minimal in-  stent restenosis of 20% to 30% and he had a 90% branch of a small diagonal  that is not amenable to percutaneous intervention and will be treated  medically, as well as an 80% distal LAD that is also for medical treatment.  His EF was 60%.   Scott Rollins tolerated the procedure well and the sheath was removed  without difficulty.  He had some chest pain post procedure, but he stated  that it was mild and refused nitroglycerin or further medical therapy.  His  EKG had no changes.  He was seen by cardiac rehab to be given a walking  program.   On March 06, 2004, his post-procedure enzymes were negative and his other  labs were within normal limits.  Scott Rollins was considered stable for  discharge on March 06, 2004 with outpatient followup arranged.   DISCHARGE CONDITION:  Stable.   DISCHARGE DIAGNOSES:  1. Unstable anginal pain, status post percutaneous transluminal coronary     angioplasty and TAXUS stent to the proximal right coronary artery, this     admission.  2. History of stents x2 to the right coronary artery and stent to the     circumflex previously.  3. Preserved left ventricular function with an ejection fraction of 60% by     catheterization, this admission.  4. Intolerance to Zetia with chest pain and fatigue, as well as Elavil with     nausea and hypotension.  5. Obesity.  6. Hypertension.  7. Dyslipidemia.  8. Ongoing tobacco abuse.   DISCHARGE INSTRUCTIONS:  1. His activity level is to include no driving for 2 days and no strenuous  activity for a week.  2. He is to stick to a low-fat and -salt diet.  3. He is to call the office for problems with his cath site.  4. He is to follow up with Dr. Emilie Rutter. Pulsipher on March 22, 2004 at     10:30 a.m. and he is to follow up with Dr. Harrel Lemon. [place]ittle as needed.   DISCHARGE MEDICATIONS:  1. Pravachol 40 mg daily.  2. Plavix 75 mg daily.  3. Altace 10 mg b.i.d.  4. Norvasc 10 mg daily.  5. Flexeril 10 mg daily or p.r.n.  6. Imipramine 25 mg b.i.d.  7. Aspirin 325 mg daily.  8. Toprol-XL 50 mg daily.  9. Chlor-Trimeton 8 mg daily.  10.      Zyrtec p.r.n.  11.      Nitroglycerin p.r.n.      Theodore Demark, P.A. [facility]                  Carole Binning, M.D. [facility]    RB/MEDQ  D:  03/06/2004  T:  03/06/2004  Job:  [id number]   cc:   Harrel Lemon. Merla Riches, M.D.  [address]  [place]  [place] [address]  Fax: [phone]   Carole Binning, M.D. [facility]

## 2010-11-29 NOTE — Discharge Summary (Signed)
NAME:  Scott Rollins, Scott Rollins                      ACCOUNT NO.:  [id number]   MEDICAL RECORD NO.:  [id number]                   PATIENT TYPE:  INP   LOCATION:  4713                                 FACILITY:  [facility]   PHYSICIAN:  Learta Codding, M.D. [facility]             DATE OF BIRTH:  August 27, 1950   DATE OF ADMISSION:  02/13/2004  DATE OF DISCHARGE:  02/14/2004                                 DISCHARGE SUMMARY   PROCEDURE:  None.   HOSPITAL COURSE:  Mr. Petrasek is a 60 year old male with history of  coronary artery disease.  He had a stent to the RCA and the circumflex in  August 2003 and percutaneous transluminal coronary angioplasty and stent to  the RCA in 2004.  He came to the hospital on February 13, 2004 for chest pain  that had been intermittent for two to three weeks described as left  tightness with radiation.  On the day of admission, the pain was associated  with shortness of breath and slightly worse.  He came to the emergency room  and was admitted for further evaluation and treatment.   Mr. Massman enzymes were negative for myocardial infarction.  He was  evaluated by Dr. Juanda Chance who felt that outpatient Cardiolite was adequate for  evaluation for ischemia.  Mr. Javid was set up for an outpatient  Cardiolite and considered stable for discharge on February 14, 2004.   DISCHARGE CONDITION:  Improved.   DISCHARGE DIAGNOSES:  1. Chest pain, enzymes negative for myocardial infarction and Cardiolite to     evaluate for ischemia.  2. Status post stent to the right coronary artery with stenting to the     circumflex in 2003.  3. Status post stent to the right coronary artery in 2004.  4. Residual coronary artery disease in the diagonal at 70% with 90% in the     diagonal subbranch as well as 90% stenosis in a tiny marginal branch of     the circumflex.  This is by cath in 2005.  5. Preserved left ventricular function with an ejection fraction of 59% by     cath in 2005.  6.  Hypertension.  7. Hyperlipidemia.  8. Obesity.  9. Ongoing tobacco use.  10.      Family history of coronary artery disease.  11.      History of cocaine use 30 years ago.  12.      History of migraines.  13.      Degenerative joint disease.  14.      Gastroesophageal reflux disease.  15.      History of side effects with Zocor, beta blockers and Pravachol.  16.      History of umbilical hernia repair.  17.      Possible history of prostate enlargement.   DISCHARGE INSTRUCTIONS:  His activity level is to include no strenuous  activity for two weeks.  He is to stick  to a low fat diet.  No caffeine or  decaff coffee or tea until after the stress test.  He is not to use tobacco.  He has a stress test scheduled on February 15, 2004 at 12:30 at the office.  He  is to follow up with the PA for Dr. Gerri Spore on August 22 at 11 a.m.   DISCHARGE MEDICATIONS:  1. Zetia 10 mg daily.  2. Metoprolol 25 mg p.o. q.h.s.  3. Altace 10 mg p.o. q.h.s.  4. Norvasc 10 mg p.o. q.h.s.  5. Plavix 75 mg p.o. q.h.s.  6. Imipramine 50 mg p.o. q.h.s.  7. Flexeril 10 mg p.o. q.h.s.  8. Aspirin 325 mg p.o. q.h.s.  9. Chlor-Trimeton p.r.n.  10.      Nitroglycerin sublingual p.r.n.   FAMILY HISTORY:      Theodore Demark, P.A. [facility]                  Learta Codding, M.D. [facility]    RB/MEDQ  D:  02/14/2004  T:  02/14/2004  Job:  [id number]   cc:   Dr. Merla Riches at [facility]   Carole Binning, M.D. [facility]

## 2010-11-29 NOTE — Cardiovascular Report (Signed)
NAME:  MOHSEN, ODENTHAL                      ACCOUNT NO.:  [id number]   MEDICAL RECORD NO.:  [id number]                   PATIENT TYPE:  INP   LOCATION:  6525                                 FACILITY:  [facility]   PHYSICIAN:  Arturo Morton. Riley Kill, M.D.             DATE OF BIRTH:  [birth year]   DATE OF PROCEDURE:  03/23/2003  DATE OF DISCHARGE:                              CARDIAC CATHETERIZATION   INDICATIONS:  Mr. Evinger is well known to me.  He is a 60 year old  gentleman who has known coronary artery disease with prior stenting of the  right coronary artery and prior stenting of a long lesion of the circumflex.  His last intervention was in August of 2003.  The current study was done to  assess coronary anatomy.   PROCEDURE:  1. Left heart catheterization.  2. Selective coronary arteriography.  3. Selective left ventriculography.  4. Percutaneous stenting of the right coronary artery.   DESCRIPTION OF PROCEDURE:  The patient was brought to the catheterization  laboratory and prepped and draped in the usual fashion.  Through an anterior  puncture the right femoral artery was entered.  Initially, a 6-French sheath  was placed.  Views of the left and right coronary arteries were obtained in  multiple angiographic projections.  Ventriculography was performed in the  RAO projection.  A brief summary of the findings included continued patency  of the previously placed stent in the circumflex.  The prior stents in the  right coronary artery were patent but there was a new lesion of about 90%  noted just after the right ventricular branch.  There was also some  progression of disease in the distal LAD in the diagonal system.  However,  the right appeared to be the worst lesion and also approachable.  The  disease in the distal LAD is in diffuse segmental location and not ideal for  percutaneous intervention.  Because of this we elected to recommend  percutaneous stenting of the right  coronary artery and the patient was  agreeable.  I spoke with the family and then subsequently with the patient.  Preparations were made for percutaneous intervention.  The patient was on  the ACUITY trial.  He was receiving intravenous bivalirudin.  With this  appropriate bolus and increase in drip was performed.  ACT was checked and  was over 300 seconds.  A JR4 guiding catheter with side holes was utilized  to intubate the right coronary artery.  High-torque floppy wire was used.  2.5 x 16 Taxus drug eluting stent was then placed with direct stenting in  the mid right coronary artery with reduction in the stenosis from 90% to 0%  with a slight indentation.  Post dilatation was done throughout the stent  using a 3 mm Quantum Maverick balloon.  Following this all catheters were  subsequently removed.  I took more follow-up views of the left coronary  system using a  guiding catheter to get better visualization.  I then  reviewed the films with Carole Binning, M.D. [facility].  All catheters were  subsequently removed and the femoral sheath sewn into place.  The patient  was taken to the holding area in satisfactory clinical condition.   HEMODYNAMIC DATA:  1. Central aorta 144/93, mean 117.  2. Left ventricle 156/18/29.  3. No aortic to left ventricular gradient on pullback across the aortic     valve.   ANGIOGRAPHIC DATA:  1. Ventriculography was performed in the RAO projection.  Overall systolic     function was well preserved and no segmental abnormalities or contraction     were identified.  Ejection fraction was calculated at 59%.  2. The left main coronary artery was free of critical disease.  3. The LAD courses to the apex.  The LAD itself has about 30-40% segmental     plaquing between the first and second diagonals.  In the distal vessel     there is a fair amount of diffuse disease with focal stenoses of about 70     and 80% but the distal LAD has an appearance of diffuse  irregularity.     Likewise, this major diagonal branch which represents the second diagonal     branch has about 70% segmental plaquing followed by a 90% stenosis in a     small tiny subbranch.  There is a first diagonal branch that has about     70% narrowing and it is a small vessel.  4. The circumflex provides a tiny first marginal branch that appears to be     without critical disease.  The second tiny marginal branch has about 90%     ostial stenosis.  Following this is a large long area of segmental     stenting with about 40% throughout the stent.  There is an AV circumflex     which has about 90% narrowing leading into the small posterolateral     system.  The large marginal branch appears to be widely patent.  5. The right coronary artery has about 30% proximal narrowing, then a long     area of 20% disease in the area of prior stenting.  Just beyond the right     ventricular branch is a 90% stenosis which was reduced to 0% with     residual luminal narrowing.  There is about 40% segmental plaquing in the     mid to distal portion of the continuation branch.   CONCLUSIONS:  1. Preserved overall left ventricular function with ejection fraction 59%.  2. Progressive high grade stenosis of the mid right coronary artery with     successful percutaneous stenting.  3. Continued patency of the circumflex stent supplying the large marginal     branch.  4. Diffuse distal left anterior descending disease as well as diagonal     disease as described in the above text.   I reviewed this with the patient and the family including pictures.  Importantly, the distal LAD is a diffusely diseased vessel and certainly not  ideal and not suitable as an ideal conduit for a graft.  Finally, the  patient is going to need to be treated with aspirin and Plavix for six  months.  Follow-up will be with Carole Binning, M.D. [facility] and he is aware of this.  The patient must stop smoking in order to have a  successful long-  term outcome.  Arturo Morton. Riley Kill, M.D.    TDS/MEDQ  D:  03/23/2003  T:  03/23/2003  Job:  [id number]   cc:   Carole Binning, M.D. [facility]   Kip Carrington   [facility]

## 2010-11-29 NOTE — H&P (Signed)
NAME:  Scott Rollins, Scott Rollins                      ACCOUNT NO.:  [id number]   MEDICAL RECORD NO.:  [id number]                   PATIENT TYPE:  INP   LOCATION:  1831                                 FACILITY:  [facility]   PHYSICIAN:  Pricilla Riffle, M.D. [facility]             DATE OF BIRTH:  11/23/1950   DATE OF ADMISSION:  02/28/2002  DATE OF DISCHARGE:                                HISTORY & PHYSICAL   IDENTIFICATION:  The patient is a 60 year old gentleman with history of  known coronary artery disease.  He presents today for evaluation of chest  pressure.   HISTORY OF PRESENT ILLNESS:  The patient's coronary history dates back to  the mid 1990s.  He had PTCA with stent therapy in November 1996, PTCA with  high-speed rotablation of RCA in 1999.  The patient had onset of angina  again in 2001 (September) and underwent PTCA and stenting of the RCA.   The patient was admitted once since then in March 2002 for chest pain  similar to previous.  He underwent repeat cardiac catheterization, and he  was shown to be free of significant disease.  The second diagonal had a 70%  lesion with a 95% lesion of a side branch (small vessel).  The left  circumflex had a 75% mid distal lesion, 75% distal lesion, and an A-V  continuation branch had a 95% lesion (small vessel).  The patient was  scheduled for an outpatient Cardiolite, not clear if it was done.  Overall,  the patient did well on medicines.   The patient over the past couple of weeks has had increasing exertional  chest pressure.  Again, he rates it as similar to what he has had in the  past, possibly not quite as severe.  Over the past weekend, he did not do  much activity and noted again intermittent chest pressure relieved on its  own.  Today, he presents again with chest pressure without exertion.  Again,  pain in mid chest radiating to his left arm.  Notes some shortness of breath  with this.   ALLERGIES:  None.   PAST MEDICAL HISTORY:  1.  Hypertension.  2. Coronary artery disease.  3. Dyslipidemia.  4. History of continued tobacco abuse.  5. GE reflux.  6. History of DJD.   SOCIAL HISTORY:  The patient is married.  He continues to smoke.  He does  not use al chol.   FAMILY HISTORY:  Positive for CAD.   CURRENT MEDICATIONS:  1. Altace 10 mg q.d.  2. Norvasc 10 mg q.d.  3. Chlor-Trimeton 8 mg q.d.  4. Aspirin 325 mg q.d.  5. Celebrex 200 mg q.d.   PHYSICAL EXAMINATION:  GENERAL:  The patient was in no acute distress but  does note some very minimal chest discomfort.  VITAL SIGNS:  Blood pressure 150s/90s.  Pulse in the 70s.  NECK:  JVP is normal.  LUNGS:  Some decreased expiratory flow with forced expiratory wheeze noted.  CARDIAC:  Regular rate and rhythm.  Normal S1, S2, no S3 or S4.  CHEST:  Nontender to palpation.  ABDOMEN:  Benign.  EXTREMITIES:  There are 2+ pulses distally.   LABORATORY DATA:  A 12-lead EKG shows normal sinus rhythm at a rate of 77  beats per minute.   IMPRESSION:  The patient is a 60 year old gentleman with known coronary  artery disease.  He presents with recurrent typical symptoms.   PLAN:  Medical therapy with Lovenox, nitrates, morphine, beta blocker,  watching pulmonary exam closely.  Given the above presentation, plan on  cardiac catheterization tomorrow.  Will check CK, MB, troponin.   The patient notes that he had thyroid function tests done at [facility] as  part of a potential study.  He was told it was too good, and he will try to  obtain those records.  The patient does have some evidence of COPD on  examination.   The patient was counseled on smoking cessation.  Will check fasting lipids  in a.m.                                               Pricilla Riffle, M.D. [facility]    PVR/MEDQ  D:  02/28/2002  T:  03/02/2002  Job:  [id number]

## 2010-11-29 NOTE — H&P (Signed)
NAME:  Scott Rollins, Scott Rollins NO.:  [id number]   MEDICAL RECORD NO.:  [id number]          PATIENT TYPE:  INP   LOCATION:                               FACILITY:  [facility]   PHYSICIAN:  Olga Millers, M.D. LHCDATE OF BIRTH:  07-02-1951   DATE OF ADMISSION:  06/11/2004  DATE OF DISCHARGE:                                HISTORY & PHYSICAL   PRIMARY CARDIOLOGIST:  Carole Binning, M.D.   PRIMARY CARE PHYSICIAN:  Katrina Stack, P.A.C.   CHIEF COMPLAINT:  Chest discomfort.   HISTORY OF PRESENT ILLNESS:  Mr. Ashmead is a very pleasant 60 year old  male patient followed by Dr. Gerri Spore with a history of coronary artery  disease, status post multiple percutaneous coronary interventions.  His last  intervention was performed in August of 2005.  He had a drug eluting stent  placed to the proximal RCA.  An abnormal Cardiolite study prompted his  catheterization.  During his catheterization, it was noted that he had  patent stents in the RCA and circumflex.  He had a 90% branch in the small  diagonal that was not amenable to percutaneous coronary intervention and  medical therapy was recommended.  He also had a distal LAD lesion of 80%  that was also treated medically.  His EF was 60%.   The patient presents to the office today with complaints of chest  discomfort.  Of note, he had complaints of chest discomfort ever since his  intervention in August of 2005. He will sometimes get these with exertion.  He will also sometimes get these at rest.  It is usually a substernal  tightness or pressure sensation.  He also has associated jaw and left arm  pain.  He will get short of breath with exertion.  He denies any syncope or  presyncope.  He will sometimes become diaphoretic.  He will sometimes note  this with showering especially.  He denies any nausea.  He saw his daughter  last night.  She is apparently on drugs and this upset him quite a bit. He  began having chest discomfort.   He took a couple of nitroglycerin and an  aspirin.  His pain went down to 1 to 2/10.  He is still having chest pain  today.  He still has discomfort in his jaw and arm.   ALLERGIES:  He is intolerant to ZETIA alone and ELAVIL.   MEDICATIONS:  1.  Aspirin 325 mg daily.  2.  Altace 10 mg b.i.d.  3.  Plavix 75 mg a day.  4.  Norvasc 10 mg daily.  5.  Imipramine 50 mg daily.  6.  Metoprolol 50 mg b.i.d.  7.  Vytorin 10/40 mg q.h.s.  8.  Flexeril p.r.n.  9.  Nitroglycerin p.r.n.   PAST MEDICAL HISTORY:  As noted above, significant for:  1.  Coronary artery disease, status post multiple PCIs.  He had drug eluting      stent placed to the proximal RCA on March 05, 2004.  He also had drug      eluting stent placed to the RCA in 2004. His  catheterization in August      of 2005, revealed patent RCA and circumflex stents.  Diagonal with 90%      stenosis - medical therapy. LAD distal 80% stenosis - medical therapy.  2.  History of hypertension.  3.  Hypercholesterolemia.  4.  Migraines.  5.  Degenerative joint disease.   SOCIAL HISTORY:  [place]  He smokes cigarettes  and has done so for 42 years.  Denies any alcohol abuse.  Denies any current  drug abuse.   FAMILY HISTORY:  Significant for coronary artery disease.   REVIEW OF SYMPTOMS:  Please see the HPI.  He denies any fevers, chills,  headaches, sore throat or rash.  He does have occasional nonproductive  cough.  He denies any hematuria or dysuria, nausea or vomiting, diarrhea,  bright red blood per rectum, melena or skin changes.  The rest of the review  of systems are negative.  He did sprain his right ankle a couple of weeks  ago.  He has noticed some swelling in his right ankle since then.   PHYSICAL EXAMINATION:  GENERAL APPEARANCE:  He is well-developed, well-  nourished in no acute distress.  VITAL SIGNS:  Blood pressure 156/96, pulse 78, respiratory rate 15, weight  286 pounds.  HEENT:  Head  normocephalic and atraumatic.  Eyes:  PERRLA, EOMI.  Sclerae  are clear.  Oropharynx pink without exudate.  NECK:  Supple without lymphadenopathy, thyromegaly, bruits or JVD.  LUNGS:  Clear to auscultation bilaterally without wheezing or rhonchi.  CARDIOVASCULAR:  Regular rate and rhythm, normal S1 and S2, no murmurs,  rubs, or gallops.  VASCULAR:  No femoral artery bruits are auscultated.  ABDOMEN:  Soft, nontender with normal active bowel sounds, no hepatomegaly.  EXTREMITIES:  There is 1 to 2+ right lower extremity edema, trace to 1+ left  lower extremity edema.  MUSCULOSKELETAL:  Without joint deformity.  NEUROLOGIC:  Alert and oriented x3, nonfocal.   Electrocardiogram revealed sinus rhythm with heart rate of 78 and no acute  changes.   IMPRESSION:  1.  Chest discomfort worrisome for nonstable angina pectoris.  2.  Coronary artery disease status post multiple percutaneous coronary      interventions as noted above.  3.  Good left ventricular function.  4.  Hypertension.  5.  Hypercholesterolemia, treated.  6.  History of migraine headaches.  7.  Obesity.  8.  Tobacco abuse.   PLAN:  The patient was seen in examination by Dr. Jens Som today.  The  patient notes that he has had chest pain intermittently since March 24, 2000.  He can have chest pain with stress.  It is not necessarily associated  with exertion all the time.  He does have shortness of breath.  As noted  above, he had chest pain last night after seeing his daughter who has a  substance abuse problem.  His pain has persisted all night.  His EKG is  nonacute.  Will plan to  admit the patient directly to [facility].  He will  travel via EMS.  We will place him on IV heparin and his usual medications.  Will add Imdur 30 mg a day.  If his enzymes are negative, will plan inpatient Cardiolite study.  If his Cardiolite study is negative, will try  to continue to treat him medically.        ________________________________________  Tereso Newcomer, P.A.  ___________________________________________  Olga Millers, M.D. [facility]  SW/MEDQ  D:  06/11/2004  T:  06/11/2004  Job:  [id number]   cc:   Carole Binning, M.D. [facility]  Theatre stage manager at [employer]

## 2010-11-29 NOTE — H&P (Signed)
NAME:  Scott Rollins, Scott Rollins                      ACCOUNT NO.:  [id number]   MEDICAL RECORD NO.:  [id number]                   PATIENT TYPE:  INP   LOCATION:  2020                                 FACILITY:  [facility]   PHYSICIAN:  Rollene Rotunda, M.D.                DATE OF BIRTH:  21-Sep-1950   DATE OF ADMISSION:  03/22/2003  DATE OF DISCHARGE:                                HISTORY & PHYSICAL   PRIMARY CARE PHYSICIAN:  Delano Metz, M.D.   REASON FOR PRESENTATION:  Evaluate patient with chest pain.   HISTORY OF PRESENT ILLNESS:  The patient is a pleasant 60 year old gentleman  whose cardiac history apparently dates back to the 1990s.  He has had  multiple cardiac interventions.  The most recent was in August of 2003.  At  that time, he had 30% mid LAD stenosis.  He had 70% long stenosis in second  diagonal followed by 95% stenosis in an inferior branch.  There was 70%  stenosis in a first diagonal.  He had 70% stenosis of a first obtuse  marginal.  There was 95% stenosis of his mid circumflex in the AV groove  proximal to his stented area.  The vessel was occluded in the distal AV  groove.  He had tandem stents in the RCA with 30% end-stent restenosis.  The  left ventriculogram demonstrated an EF that was normal.  At the last visit,  he had stenting with a nondrug-eluding stent in the mid circumflex.   He now says that he is having recurrent discomfort.  This has been going on  for a couple of days.  It is his usual angina.  He describes chest  discomfort radiating to his jaw, neck, and left arm.  At its worse, it has  been 8/10 in intensity.  It is occurring at rest.  He did have some  improvement with nitroglycerin this morning.  He is currently pain-free in  the emergency room on IV nitroglycerin.  He has been under emotional stress  recently.  Otherwise he says he has been doing relatively well.  He has not  been having any shortness of breath, PND, or orthopnea.  He has not  been  having palpitations, presyncope, or syncope.   PAST MEDICAL HISTORY:  1. Coronary artery disease as described.  2. Migraines.  3. Hyperlipidemia for years.  4. Hypertension for years.  5. Obesity.  6. Degenerative joint disease.  7. Gastrointestinal reflux disease.   ALLERGIES:  1. ZOCOR caused leg cramps.  2. BETA BLOCKERS caused fatigue and impotence.   MEDICATIONS:  1. Aspirin 325 mg a day.  2. Altace 10 mg b.i.d.  3. Verapamil 120 mg daily.  4. Imipramine 25 mg p.r.n.  5. Flexeril p.r.n.  6. Celebrex p.r.n.   SOCIAL HISTORY:  [place].  He  works for the state as an Production designer, theatre/television/film.  He continues  to smoke two packs of  cigarettes per day for 40 years.  He occasionally drinks wine.  He is  married.  He has two children.  He is expecting a second grandchild soon.   FAMILY HISTORY:  Contributory for early coronary artery disease in a brother  and his father.   REVIEW OF SYSTEMS:  As stated in the HPI.  Negative for all other systems.   PHYSICAL EXAMINATION:  GENERAL APPEARANCE:  The patient is in no distress.  VITAL SIGNS:  Blood pressure 173/88, heart rate 90 and regular.  HEENT:  Eyelids unremarkable.  Pupils equal, round, and reactive to light.  Fundi no visualized.  Oral mucosa unremarkable.  NECK:  No jugular venous distention.  Wave form within normal limits.  Carotid upstroke brisk and symmetrical with no bruits.  No thyromegaly.  LYMPHATICS:  No cervical, axillary, or inguinal adenopathy.  LUNGS:  Clear to auscultation bilaterally.  BACK:  No costovertebral angle tenderness.  CHEST:  Unremarkable.  HEART:  PMI not displaced or sustained.  S1 and S2 within normal limits.  No  S3.  No S4.  No murmurs.  ABDOMEN:  Obese.  Positive bowel sounds, normal in frequency and pitch.  No  bruits.  No rebound.  No guarding.  No midline pulsatile mass.  No  organomegaly.  SKIN:  No rash or nodules.  EXTREMITIES:  2+ pulses.  No  edema.  No cyanosis.  No clubbing.  NEUROLOGIC:  Oriented to person, place, and time.  Cranial nerves II-XII  grossly intact.  Motor grossly intact.   LABORATORY DATA:  EKG:  Sinus rhythm, axis within normal limits, intervals  within normal limits, no acute ST wave changes.   IMPRESSION AND PLAN:  1. Chest discomfort.  The patient's chest discomfort is similar to his     previous angina.  It is new in onset and so is unstable.  He will be     started on heparin.  He will be treated with IV nitroglycerin.  We may     need to use beta blockers at least in the short term.  He will have an     elective cardiac catheterization.  2. Hypertension.  The blood pressure was elevated when he came in.  He needs     therapeutic lifestyle changes.  He will continue on his previous medical     regimen.  3. Risk reduction.  Will check a lipid profile.  Will discuss again the need     for a statin.  4.     Tobacco.  He needs continued education about the need to stop smoking.  5. Obesity.  He needs continued education about the need to lose weight with     diet and exercise.  Will strongly encourage cardiac rehabilitation.                                                Rollene Rotunda, M.D.    JH/MEDQ  D:  03/22/2003  T:  03/22/2003  Job:  [id number]   cc:   Delano Metz, M.D.  [address]  [place]  [place] [address]  Fax: [phone]

## 2010-11-29 NOTE — Cardiovascular Report (Signed)
NAME:  Scott Rollins, Scott Rollins                      ACCOUNT NO.:  [id number]   MEDICAL RECORD NO.:  [id number]                   PATIENT TYPE:  OIB   LOCATION:  6526                                 FACILITY:  [facility]   PHYSICIAN:  Carole Binning, M.D. [facility]         DATE OF BIRTH:  Nov 11, 1950   DATE OF PROCEDURE:  03/05/2004  DATE OF DISCHARGE:                              CARDIAC CATHETERIZATION   PROCEDURE PERFORMED:  1. Left heart catheterization with coronary angiography and left     ventriculography.  2. Percutaneous transluminal coronary angioplasty with placement of a drug-     eluting stent in the proximal right coronary artery.  3. Intravascular ultrasound in left anterior descending artery as part of     the STRADIVARIUS Trial.   INDICATION:  Mr. Hoskin is a 60 year old male with history of coronary  artery disease and multiple prior percutaneous coronary interventions.  He  has recently experienced progressive exertional angina.  A stress Cardiolite  performed in the office showed inferior infarct with peri-infarct ischemia.  We therefore proceeded with cardiac catheterization.   CATHETERIZATION PROCEDURAL NOTE:  A 6-French sheath was placed in the right  femoral artery.  Coronary angiography was performed with standard Judkins 6-  French catheters.  Left ventriculography was performed with an angled  pigtail catheter.  Contrast was Omnipaque.  There were no complications.   CATHETERIZATION RESULTS:   HEMODYNAMICS:  Left ventricular pressure 144/23, aortic pressure 144/94.  There was no aortic valve gradient.   LEFT VENTRICULOGRAM:  Wall motion is normal and ejection fraction is  estimated at greater than or equal to 60%.  There is no mitral  regurgitation.   CORONARY ARTERIOGRAPHY (RIGHT DOMINANT):  1. Left main is normal.  2. Left anterior descending artery has moderate calcification in the     proximal vessel with a 30% stenosis in the proximal vessel.  The  distal     LAD has a 20% followed by 50% stenosis.  The apical LAD has an 80%     stenosis.  The LAD gives rise to a small first diagonal branch and a     large branching second diagonal.  The second diagonal has a diffuse 50%     stenosis in the main branch.  In a small subbranch of this diagonal,     there is a 90% stenosis.  3. Left circumflex gives rise to small first and second obtuse marginal     branches and a very large obtuse marginal branch.  There is a stent in     the mid-circumflex extending into the proximal portion of the third     obtuse marginal branch.  The stent is patent with approximately 30%     stenosis within the stent.  4. The right coronary artery is a dominant vessel.  There is an 80% stenosis     in the proximal right coronary artery.  Following this, there is a stent  in the mid right coronary artery with 20% stenosis within the stent.     Following this, there are overlapping stents in the mid-to-distal right     coronary artery with approximately 20% stenosis within these stents.  The     distal right coronary gives rise to a large acute marginal branch which     supplies the apical portion of the inferior septum.  There is a small     posterior descending artery, small first posterolateral branch and normal-     sized second and third posterolateral branches arising from the distal     right coronary artery.  There is a 40% stenosis in the A-V groove portion     of the distal right coronary artery.   IMPRESSIONS:  1. Preserved left ventricular systolic function.  2. Coronary artery disease characterized by significant stenosis in the     proximal right coronary artery.  There is moderate disease in the distal     and apical left anterior descending in what is a small segment of vessel.     There is also disease in the subbranch of the diagonal.   PLAN:  Percutaneous intervention to the right coronary artery.  See below.   PERCUTANEOUS TRANSLUMINAL  CORONARY ANGIOPLASTY PROCEDURAL NOTE:  Following  completion of diagnostic catheterization, we proceeded with percutaneous  coronary intervention.  Angiomax was administered per protocol.  We utilized  the preexisting 6-French sheath in the right femoral artery.  We used a 6-  [place] JR4 guiding catheter.  A ASAHI soft coronary guidewire was advanced  under fluoroscopic guidance into the distal right coronary artery.  We then  performed PTCA with a 3.0 x 12.0-mm Quantum balloon inflated to 12  atmospheres.  We then positioned a 3.0 x 16.0-mm TAXUS drug-eluting stent  across the diseased segment of the proximal right coronary artery.  This  stent was positioned such that the distal end of that overlapped with a  preexisting stent in the mid-vessel.  The stent was deployed at 18  atmospheres.  Intracoronary nitroglycerin was administered.  Final  radiographic images were obtained, revealing patency of the right coronary  artery with 0% residual stenosis at the stent site and TIMI-3 flow.   The patient was enrolled in the STRADIVARIUS Trial.  As part of this trial,  we proceeded with intravascular ultrasound of the left anterior descending  artery.  We used a 6-French JL4 guiding catheter.  A Hi-Torque Floppy wire  was advanced into the distal LAD.  Intravascular ultrasound was performed  utilizing the Discovery IVUS system with images recorded on automatic  pullback.  Final angiographic images were then obtained revealing patency of  the LAD with no evidence of vessel damage.  Intravascular ultrasound images  did reveal mild diffuse plaque of the distal LAD as well as in the proximal  LAD.  There was moderate calcification in the proximal LAD.   At the conclusion of the procedure, an AngioSeal vascular closure device was  placed in the right femoral artery.   COMPLICATIONS:  None.  RESULTS:  Successful placement of a drug-eluting stent in the proximal right  coronary artery.  An 80%  stenosis was reduced to 0% residual with TIMI-3  flow.   PLAN:  Angiomax will be discontinued at this point.  The patient will be  continued on Plavix for a recommended 1 year.  He will be treated per the  STRADIVARIUS protocol.  Carole Binning, M.D. [facility]    MWP/MEDQ  D:  03/05/2004  T:  03/06/2004  Job:  [id number]   cc:   Harrel Lemon. Merla Riches, M.D.  [address]  [place]  [place] [address]  Fax: [phone]   [facility]

## 2010-11-29 NOTE — Discharge Summary (Signed)
NAME:  [name], [name] NO.:  [id number]   MEDICAL RECORD NO.:  [id number]          PATIENT TYPE:  INP   LOCATION:  6531                         FACILITY:  [facility]   PHYSICIAN:  Carole Binning, M.D. LHCDATE OF BIRTH:  [birth year]   DATE OF ADMISSION:  06/11/2004  DATE OF DISCHARGE:  06/13/2004                           DISCHARGE SUMMARY - REFERRING   DISCHARGE DIAGNOSES:  1.  Chest pain, resolved.  2.  Coronary artery disease, medical treatment.  3.  Hyperlipidemia treated with Vytorin.  4.  Hypertension, treated.  5.  History of migraines.  6.  Degenerative joint disease.  7.  History of tobacco abuse.  Smoking cessation recommended.   HOSPITAL COURSE:  Mr. Munger is a 60 year old male patient with a known  history of coronary artery disease.  He has had multiple percutaneous  coronary interventions in the right coronary artery as well as circumflex  system.  He was admitted on June 11, 2004 with substernal chest pain.  This was associated with left arm pain, jaw pain, and shortness of breath.   LABORATORIES:  TSH 1.325.  Hemoglobin A1C 5.2.  Cardiac isoenzymes negative.  Total cholesterol 144, triglycerides 232, HDL 26, LDL 72, VLDL 46.  Sodium  140, potassium 3.7, BUN 11, creatinine 0.9.  Hemoglobin 12.9, hematocrit  37.3, platelets 289, white count 8.5.   His EKG showed normal sinus rhythm, rate 73 with no acute ST-T wave changes.   The patient ultimately underwent cardiac catheterization on June 11, 2004.  He was found to have patent stents in the right coronary artery with  preserved LV.  The patient had multiple other lesions in the left system  that were felt to be medical management only.  At this point the patient  remained in the hospital overnight and was stable enough for discharge on  June 13, 2004.  He was discharged home in stable condition on the  following medications.   DISCHARGE MEDICATIONS:  1.  Enteric-coated aspirin 325  mg daily.  2.  Altace 10 mg b.i.d.  3.  Plavix 75 mg daily.  4.  Norvasc 10 mg daily.  5.  Imipramine 50 mg daily.  6.  Metoprolol 50 mg p.o. b.i.d.  7.  Vytorin 10/40 q.h.s.  8.  Imdur 30 mg daily.  9.  Flexeril p.r.n.  10. Sublingual nitroglycerin p.r.n. chest pain.   ACTIVITY:  No straining or lifting over 10 pounds for one week.   DIET:  Remain on a low fat diet.   WOUND CARE:  Clean over catheterization site with soap and water.  No  scrubbing.  Call [phone] for questions or concerns.   FOLLOWUP:  Follow up with Guy Franco, P.A. with Dr. Gerri Spore on June 28, 2004 at 2 p.m. for groin check.       LB/MEDQ  D:  06/13/2004  T:  06/13/2004  Job:  [id number]   cc:   Katrina Stack, P.A.  [facility]

## 2010-11-29 NOTE — H&P (Signed)
NAME:  AVIR, DERUITER NO.:  [id number]   MEDICAL RECORD NO.:  [id number]          PATIENT TYPE:  INP   LOCATION:  1823                         FACILITY:  [facility]   PHYSICIAN:  Learta Codding, M.D. LHCDATE OF BIRTH:  [birth year]   DATE OF ADMISSION:  07/17/2004  DATE OF DISCHARGE:                                HISTORY & PHYSICAL   PRIMARY CARE PHYSICIAN:  __________ P.A.-C.   PRIMARY CARDIOLOGIST:  Carole Binning, M.D. [facility]   CHIEF COMPLAINT:  Chest discomfort.   HISTORY OF PRESENT ILLNESS:  Mr. Baby is a very pleasant 60 year old  married, white male with history of coronary artery disease status post  multiple coronary interventions, hypertension, hypercholesterolemia.  He has  had a history of chronic recurring chest discomfort since March 24, 2000.  He has had several interventions since then.  He was last admitted to  the hospital at the end of November of 2005.  He underwent cardiac  catheterization June 12, 2004.  Please see the results below.  Basically, his stents were patent and he had moderate diffuse disease that  was not obstructive in the left system.  The decision was made to treat him  medically.  The patient was started don Imdur 30 mg daily.  He was seen back  in the office by the PA for Dr. Gerri Spore.  His Imdur was increased to 60 mg  daily.  The patient has continued to have chest discomfort since then.  He  can get it at rest or with exertion.  He can exert himself at times and not  get chest pain.  It is the same pain he has had all along.  It is a left  sided pressure-type sensation.  He does have positive radiation down his  left arm and up into his left neck.  He will usually get somewhat nauseated  and somewhat diaphoretic.  He notes significant shortness of breath with  exertion.  Minimal exertion will get him short of breath.  He denies  orthopnea, but does have some episodes of shortness of breath that wakes him  up.   Denies any pleuritic chest discomfort.  He does note some right lower  extremity swelling since an injury to his leg in October 2005.  He was  treated with a couple of grams of antibiotics secondary to this.  He has had  no ultrasound of his leg or chest CT done for rule out pulmonary emboli.  He  does usually take nitroglycerin for his chest discomfort.  This will usually  help.  His discomfort awoke him around 2 o'clock this morning.  His wife was  concerned and finally convinced him to come to the emergency room.   PAST MEDICAL HISTORY:  1.  Coronary artery disease.      1.  Status post drug eluting stent with proximal RCA March 05, 2004.      2.  Drug eluting stent with RCA in 2004.      3.  Catheterization June 12, 2004, distal to LAD 50% stenosis/80%  stenosis/50% stenosis with circumflex stent with 30-40% stenosis, AD          circumflex with 80% stenosis.  Obtuse marginal branch with 50%          stenosis.  This was a large vessel RCA proximal stent patent stent          further down in the vessel and 30% end-stent restenosis.  Mid vessel          with 40% stenosis, stent further down in the vessel 30% stenosis and          distal vessel with 50-60% stenosis.  2.  EF was 50-55%.  3.  Hypertension.  4.  Hypercholesterolemia.  5.  Migraine headache.  6.  Degenerative joint disease.  7.  Obesity.   ALLERGIES:  He is intolerant to ZETIA AND ELAVIL.   MEDICATIONS:  1.  Aspirin 325 mg daily.  2.  Altace 10 mg daily.  3.  Plavix 75 mg daily.  4.  Norvasc 10 mg daily.  5.  Imipramine 50 mg daily.  6.  Metoprolol 50 mg b.i.d.  7.  Vytorin 40 mg daily.  8.  Imdur 60 mg daily.  9.  The patient had called the office yesterday with complaints of continued      swelling.  He was started on Lasix 40 mg daily and potassium 20 mEq      daily July 16, 2004.   SOCIAL HISTORY:  The patient lives in [place] with his wife.  He is  currently out of work because he is  disabled.  He denies alcohol abuse.  He  smoked for 40+ years and quit one week ago.   FAMILY HISTORY:  Significant for coronary artery disease.  Father died at  age 67 of myocardial infarction.  He has two brothers with severe coronary  disease   REVIEW OF SYSTEMS:  Please see HPI.  Denies any fevers or chills or weight  changes.  He has noted quite a bit of fatigue.  Denies any headache or sore  throat.  He does have lesion on his right lower extremity that is healing.  Denies any syncope, presyncope, dysuria, hematuria, depression, myalgias,  arthralgias, melena, hematochezia, aphagia, odynophagia, or GERD symptoms.  His wife does note he has restless legs at night.  He does snore.  She has  never witnessed any apneic episodes.  Rest of review of systems is negative.   PHYSICAL EXAMINATION:  GENERAL APPEARANCE:  Well-nourished, well-developed,  no acute distress.  VITAL SIGNS:  Blood pressure 108/83, pulse 96, temperature 97.4,  respirations 15.  HEENT:  Head normocephalic, atraumatic.  Eyes:  PERRLA.  EOMI.  Sclerae are  clear.  Oropharynx pink without exudate.  NECK:  Without JVD.  LYMPH:  Without lymphadenopathy.  ENDOCRINE:  Without thyromegaly.  VASCULAR:  Without carotid bruits bilaterally.  Femoral pulses are difficult  to palpate but no bruits are auscultated bilaterally.  CARDIAC:  Normal S1, S2.  Regular rate and rhythm without murmurs, clicks,  rubs or gallops.  LUNGS:  Clear to auscultation bilaterally.  ABDOMEN:  Obese, soft, nontender with normoactive bowel sounds, no  hepatomegaly.  EXTREMITIES:  With 1-2+ pitting edema on the right.  Calf was tight.  No  palpable cords are noted.  MUSCULOSKELETAL:  No spine or CVA tenderness.  SKIN:  A healing abrasion noted to his right lower extremity over on the mid  shin.  NEUROLOGICAL:  Alert and oriented x3.  He has 5/5  strength in bilateral  upper and lower extremities.  STUDIES:  Chest x-ray is pending.   Electrocardiogram revealed a sinus  tachycardia with heart rate of 102, normal axis, no acute changes.   LABORATORY DATA:  Pending.   IMPRESSION:  1.  Chest discomfort.  2.  Coronary artery disease.      1.  History of multiple PCI's as noted above.      2.  Catheterization showing patent stents in the right coronary artery          and circumflex and nonobstructive disease __________ June 12, 2004 - medical therapy.  3  Ejection fraction 50-55%.  1.  Right lower extremity edema.  Rule out deep vein thrombosis and      pulmonary embolus.  2.  Obesity.  3.  Hypertension.  4.  Hypercholesterolemia.  5.  Ex-smoker.  6.  Degenerative joint disease.  7.  History of migraine.  8.  The patient is also being examined by Dr. Andee Lineman.   PLAN:  Admit the patient to a telemetry bed.  We will treat him with  aspirin, Plavix, IV heparin and his usual medications.  Will increase his  Imdur to 90 mg daily.  Will place him on GI prophylaxis with Protonix 40 mg  daily.  We will check serial cardiac enzymes.  We will follow up on his  chest x-ray and laboratory data.  Will make sure he gets a C-MET, CBC,  PT/PTT and TSH.   The patient does have right lower extremity swelling.  He has not felt well  for the last several months.  He continues to have chronic chest discomfort  and had no lesions on catheterization in November 2005 that required PCI.  We will go ahead and send the patient for a chest CT to rule out pulmonary  emboli.  Will use the lower extremity protocol to also rule out right lower  extremity DVT.   If the patient rules in for myocardial infarction, we will proceed with  cardiac catheterization.  If the enzymes are negative, then we will proceed  with myoview study.  We will discuss this further to decide whether or not  to do it as an inpatient versus outpatient.   The patient has the body habitus for obstructive sleep apnea.  Although, his  shortness of breath is  probably not cause from sleep apnea, he does have  significant fatigue and his wife has noted that he snores and has restless  legs at night.  We will try to obtain overnight O2 saturations.  He may need  a formal evaluation as an outpatient for obstructive sleep apnea.   ATTENDING ADDENDUM: I have seen and examined the patient and agree with  above evaluation and plan.  Peyton Bottoms, MD, [facility]      [place]   SW/MEDQ  D:  07/17/2004  T:  07/17/2004  Job:  [id number]

## 2010-11-29 NOTE — Discharge Summary (Signed)
[place]. [facility]  Patient:    Scott Rollins, Scott Rollins                   MRN: [id number] Adm. Date:  [id number] Disc. Date: [id number] Attending:  Colon [place] Dictator:   Delton See, P.A. CC:         Teena Irani. Arlyce Dice, M.D. at [facility]   Referring Physician Discharge [place]  DATE OF BIRTH:  03/13/1951  HISTORY ON ADMISSION:  Scott Rollins is a 60 year old male with a history of coronary artery disease who underwent PTCA stenting of the right coronary artery in September 2001.  The patient had normal wall motion with an ejection fraction of 58% at that time.  He had a previous PTCA stent of the RCA in November 1996 as well as a PTCA HSRA of the RCA in 1999.  The patient was admitted to [facility] on September 21, 2000 by Dr. Eden Emms for further evaluation of chest pain.  PAST MEDICAL HISTORY:  Please see cardiac history as noted above.  The patient does have hypertension, elevated lipids, a history of tobacco use, positive fracture of coronary artery disease, gastroesophageal reflux disease symptoms, and history of DJD, as well as obesity.  ALLERGIES:  No known drug allergies.  SOCIAL HISTORY:  The patient is married.  He sells International aid/development worker.  He has a long history of tobacco use.  He does not use alcohol.  HOSPITAL COURSE:  As noted, this patient was admitted to [facility] on September 21, 2000 by Dr. Eden Emms for further evaluation of chest pain.  CK-MB enzymes were negative, troponin was mildly elevated at 0.6.  On September 22, 2000 the patient underwent cardiac catheterization performed by Dr. Gerri Spore.  He was noted to have two-vessel coronary artery disease with a normal ejection fraction.  Please see Dr. Wanita Chamberlain complete dictated report for full details.  It was felt that medical treatment was indicated.  His Norvasc was increased to 5 mg daily.  It was recommended that he have a Cardiolite on an  outpatient basis to further evaluate for ischemia.  It was felt that intervention could be considered if the Cardiolite was positive for ischemia.  Arrangements were made to discharge the patient on September 23, 2000.  His Norvasc was increased prior to discharge.  He was also given a prescription for Zyban and Ambien by request.  LABORATORY DATA:  As noted, he had a troponin that was mildly elevated at 0.6 at time of admission.  A basic metabolic panel on March 12 was within normal limits with BUN 13, creatinine 0.8, potassium 3.9.  A CBC on March 12 was within normal limits.  A chest x-ray showed mild cardiomegaly and was interpreted as stable.  An EKG shows normal sinus rhythm, interpreted as a normal EKG.  A CBC on March 13 was within normal limits.  DISCHARGE MEDICATIONS:  1. As noted, the patients Norvasc was increased from 2.5 to 5 mg daily.  2. Enteric-coated aspirin 325 mg daily.  3. Altace 10 mg daily.  4. Nitroglycerin p.r.n. for chest pain.  5. Zyban 150 mg one daily for three days then one twice daily.  6. Ambien 5 mg at bedtime as needed for sleep, #30 with one refill.  ACTIVITY:  The patient was told to avoid any strenuous activity or driving for two days.  DIET:  He was to be on a low salt/low fat diet.  WOUND CARE:  He was told to call the office if he had any increased pain, swelling, or bleeding from his groin.  SPECIAL INSTRUCTIONS:  He was to quit smoking.  He was to have a stress test in six to eight weeks to be scheduled at his next appointment.  FOLLOW-UP:  He was to see Dr. Wanita Chamberlain physician assistant on Friday, March 22 at 9 a.m.  He was to see Dr. Dara Hoyer as needed or as scheduled.  PROBLEM LIST AT TIME OF DISCHARGE:  1. Chest pain, negative enzymes except for a mildly elevated troponin at 0.6.  2. Previous history of coronary artery disease with previous percutaneous     transluminal coronary angioplasty stents as documented above.  3.  Normal left ventricular function.  4. Cardiac catheterization September 22, 2000 revealing two-vessel coronary     artery disease with normal left ventricular function - medical treatment     recommended.  5. Tobacco history.  6. History of gastroesophageal reflux disease.  7. Elevated lipids.  8. Positive family history of coronary artery disease.  9. Obesity. 10. Question obstructive sleep apnea.  DISPOSITION:  As noted, the plan at this time will be to have the patient follow up in the office with the physician assistant for a groin check.  At that time he should be scheduled for an outpatient Cardiolite as well as a follow-up visit with Dr. Gerri Spore in six to eight weeks.  If the Cardiolite is positive, percutaneous intervention will be considered.DD:  09/23/00 TD:  09/23/00 Job: [id number]

## 2010-11-29 NOTE — Discharge Summary (Signed)
NAME:  Scott Rollins, Scott Rollins NO.:  [id number]   MEDICAL RECORD NO.:  [id number]          PATIENT TYPE:  INP   LOCATION:  4705                         FACILITY:  [facility]   PHYSICIAN:  Lyn Records, M.D.   DATE OF BIRTH:  Oct 13, 1950   DATE OF ADMISSION:  04/03/2005  DATE OF DISCHARGE:  04/04/2005                                 DISCHARGE SUMMARY   ADMITTING DIAGNOSES:  1.  Progressive chest discomfort.  2.  History of coronary artery disease.   DISCHARGE DIAGNOSES:  1.  Progressive chest discomfort of uncertain etiology.  No evidence of      progression of coronary disease documented by catheterization.  2.  History of coronary atherosclerosis with multiple right coronary      percutaneous interventions including stents and circumflex interventions      including a stent.      1.  No change in anatomy when compared to the most recent          catheterization from November of 2005.  3.  Hypertension, poorly controlled.  4.  Sleep apnea.  5.  Significant obesity.  6.  Hyperlipidemia.   PROCEDURES PERFORMED:  Coronary angiography performed on April 04, 2005.   CONDITION ON DISCHARGE:  Improved.   DISCHARGE INSTRUCTIONS:  The patient may return to full activity on  April 06, 2005.  He should gradually increase physical activity up to  that point.   MEDICATIONS:  1.  Labetalol is increased to 300 mg b.i.d. to help better control blood      pressure.  2.  Other medications are unchanged and they include clopidogrel 75 mg per      day.  3.  Imipramine 50 mg each evening, 25 mg each morning.  4.  Coated aspirin 325 mg daily.  5.  Diltiazem ER 240 mg daily.  6.  Cardura 2 mg daily.  7.  Flexeril 10 mg daily.  8.  Niacin 500 mg daily.  9.  Lisinopril 20 mg daily.  10. Crestor 10 mg daily.  11. Nitroglycerin 0.4 mg sublingually p.r.n.   FURTHER INSTRUCTIONS:  1.  Wear CPAP mask as this may have something to do with the patient's      increasing symptoms  lately.  2.  Continue cardiac rehab. teachings.  3.  Attempt to lose weight.  4.  Okay to return to work on April 06, 2005.   COMMENTS/HOSPITAL COURSE:  Patient was admitted to the hospital.  Please see  H&P.  Because of his complicated prior cardiac history, it was felt that  coronary angiography would be helpful to define the anatomy and help make  sure that there was no progression of disease.  The patient did rule out for  myocardial infarction by serial enzymes.  He slept comfortably during his  first 18 hours in the hospital.   Laboratory data did not reveal any significant abnormalities.  There were no  acute EKG changes.  Coronary angiography performed on the morning of  April 05, 2005 did not reveal any significant progression or change in  the disease compared to November 2005.  The patient is therefore discharged from the hospital in improved condition  and his labetalol dose is titrated upward to 300 mg twice a day to help with  better blood pressure control.      Lyn Records, M.D.  Electronically Signed     HWS/MEDQ  D:  04/04/2005  T:  04/05/2005  Job:  [id number]   cc:   [facility]

## 2010-11-29 NOTE — Discharge Summary (Signed)
NAME:  Scott Rollins, Scott Rollins NO.:  [id number]   MEDICAL RECORD NO.:  [id number]          PATIENT TYPE:  INP   LOCATION:  3730                         FACILITY:  [facility]   PHYSICIAN:  Carole Binning, M.D. LHCDATE OF BIRTH:  Feb 11, 1951   DATE OF ADMISSION:  07/17/2004  DATE OF DISCHARGE:  07/18/2004                                 DISCHARGE SUMMARY   PROCEDURES:  1.  CT of the chest, PE protocol.  2.  CT of the lower extremities, limited without contrast.   HOSPITAL COURSE:  Scott Rollins is a 60 year old male with a history of  coronary artery disease and percutaneous intervention.  He was catheterized  on June 12, 2004, showing modest disease in the LAD system and patent  stent.  Medical therapy was recommended.  He came to the emergency room on  the day of admission complaining of worsening chest pain.  He has had chest  pain since 2001 and his Imdur was increased at an office visit on December  16 but this did not help.  His chest pain became worse on the day of  admission and he came to the emergency room.  He was admitted for further  evaluation and treatment.   His cardiac enzymes were negative.  The D-dimer was 0.35 and a chest CT was  done to rule out PE.  There was no evidence of acute pulmonary embolism or  other acute chest process.  There were no effusions.  A small hiatal hernia  was present.  CT of the lower extremities to evaluate for DVT showed no DVT  but deep venous opacification was marginal.  There was no evidence of  retroperitoneal mass or inflammatory change.   On July 18, 2004, Scott Rollins was evaluated by Dr. Gerri Spore.  His TSH  was within normal limits and his cardiac enzymes were negative.  Dr.  Gerri Spore felt that his Lopressor should be decreased from 50 mg b.i.d. to  25 mg b.i.d. for symptoms of fatigue.  Protonix was added to his medication  regimen.  He has a stress test scheduled for July 19, 2004 and to follow  up with  the P.A. for Dr. Gerri Spore on July 29, 2004.  Mr. Zelek was  considered stable for discharge on July 18, 2004 with outpatient followup  arranged.   DISCHARGE DIAGNOSES:  1.  Chest pain, cardiac enzymes negative for myocardial infarction, CT      negative for pulmonary embolism, Cardiolite to evaluate for angina in      morning.  2.  Status post drug-alluding stent to the right coronary artery in 2001 and      in August 2005.  3.  Status post cardiac catheterization on June 12, 2004, showing 80%      stenosis to the distal left anterior descending, stent to the circumflex      30-40% and stent restenosis to the circumflex 80%, OM1 50%.  Right      coronary artery had patent stents with 30% in-stent restenosis in the      more distal stent and a mid 40% stenosis.  There  was a distal 50-60%      stenosis as well.  Ejection fraction was 55%.  4.  Hypertension.  5.  Hyperlipidemia.  6.  History of migraines.  7.  Degenerative joint disease.  8.  Obesity.  9.  ALLERGY OR INTOLERANCE TO ZETIA AND ELAVIL.  10. Family history of coronary artery disease.  11. History of restless legs.  12. History of cough with clear sputum.  13. Right lower extremity edema.  14. Remote history of tobacco use.   DISCHARGE INSTRUCTIONS:  His activity is to include no strenuous activity  until after his stress test.  He is to stick to a low fat diet and eat  nothing and drink nothing after midnight tonight.  He can take his  medications with sips of water.  He is to follow up with Katrina Stack, P.A.-  C. as directed or as needed and see the P.A. for Dr. Gerri Spore on July 29, 2004 at 2:30 p.m.   DISCHARGE MEDICATIONS:  1.  Lopressor 50 mg, one half tablet b.i.d.  2.  Protonix 40 mg daily.  3.  Aspirin 325 mg daily.  4.  Altace 10 mg b.i.d.  5.  Plavix 75 mg daily.  6.  Norvasc 10 mg daily.  7.  Imipramine 50 mg daily.  8.  Vytorin 10/40 daily.  9.  Imdur 60 mg daily.  10. Lasix 40 mg  daily.  11. Potassium 20 mEq daily, started July 16, 2004.      Rhon   RB/MEDQ  D:  07/18/2004  T:  07/18/2004  Job:  [id number]   cc:   Katrina Stack, P.A.-C.

## 2010-11-29 NOTE — H&P (Signed)
NAME:  Scott Rollins, Scott Rollins                      ACCOUNT NO.:  [id number]   MEDICAL RECORD NO.:  [id number]                   PATIENT TYPE:  INP   LOCATION:  1826                                 FACILITY:  [facility]   PHYSICIAN:  Learta Codding, M.D. [facility]             DATE OF BIRTH:  23-Sep-1950   DATE OF ADMISSION:  02/13/2004  DATE OF DISCHARGE:                                HISTORY & PHYSICAL   PRIMARY CARE PHYSICIAN:  Dr. Merla Riches.   CHIEF COMPLAINT:  Chest pain.   HISTORY OF PRESENT ILLNESS:  Scott Rollins is a 60 year old male with a  history of coronary artery disease.  He has TAXUS stent to his RCA in  September of 2004.  He describes chest pain for two to three weeks.  He  states it is located on his left side and is a tightness that radiates to  the left side of his neck and is associated with dullness in his left arm.  It is a 4 to 6/10.  He states that on the days that he works, it occurs  daily and starts about mid morning.  It lasts all day.  It is reduced but  not relieved by sublingual nitroglycerin.  He states that he has it all the  way up until he goes to bed but does not wake up with it in the morning.  He  states that it is associated with mild shortness of breath but no nausea,  vomiting, or diaphoresis.  He denies any palpitations.  He denies any new  dyspnea on exertion.  He states the pain is increased by lying on his left  side.  He has also taken aspirin for this pain which helps.  The pain is  similar in location to his previous anginal symptoms but not as severe.   ALLERGIES:  He is intolerant to ZOCOR and has fatigue with BETA-BLOCKERS.   MEDICATIONS:  1. Metoprolol 25 mg p.o. q.h.s.  2. Altace 10 mg p.o. q.h.s.  3. Norvasc 10 mg p.o. q.h.s.  4. Plavix 75 mg p.o. q.h.s.  5. Imipramine 50 mg p.o. q.h.s.  6. Flexeril 10 mg p.o. q.h.s.  7. Aspirin 325 mg p.o. q.h.s.  8. Chlor-Trimeton 8 mg p.o. q.h.s.  9. Nitroglycerin p.r.n.   PAST MEDICAL HISTORY:  1.  Hypertension.  2. Hyperlipidemia.  3. Obesity.  4. Ongoing tobacco use.  5. Family history of coronary artery disease.  6. Remote history of cocaine use.  7. Status post cardiac catheterization in September of 2004 with a TAXUS     stent to the LAD.  His EF was 59% at that time.  He had a 70% diagonal     with a 90% stenosis in the subbranch as well as a tiny marginal branch     with 90% stenosis and 40% in-stent restenosis in the circumflex, a 90%     stenosis in the AV circumflex.  8.  History of migraines.  9. History of degenerative joint disease.  10.      History of gastroesophageal reflux disease symptoms.   PAST SURGICAL HISTORY:  1. Umbilical hernia repair.  2. 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  He has approximately 50-pack-year  history of tobacco and use is ongoing.  He does not abuse alcohol.  He has  not used drugs in 30 years.   FAMILY HISTORY:  His mother is alive at age 33 with a history of  hypertension and his father died at age 39.  His father had a history of  bypass surgery and had his first MI in his 95s.  He has two brothers, one in  his 82s and one in his 71s, who both had MIs in their 34s.   REVIEW OF SYMPTOMS:  He has a history of night sweats but those  have gotten  better recently.  He has a history of headaches and nasal discharge but this  is not purulent.  He has no skin problems.  He has chest pain and shortness  of breath as described above.  He has some urinary urgency and some  frequency.  He has some depression and anxiety which have been worse  recently.  He has chronic joint pains.  He denies any recent reflux symptoms  and states those are well controlled on his medication.  Review of systems  is otherwise negative.   PHYSICAL EXAMINATION:  GENERAL APPEARANCE:  He is a well-developed obese  white male in no acute  distress.  VITAL SIGNS:  Temperature is 97.4, blood pressure 142/85, pulse 93,  respiratory rate 22.  O2 saturation is within normal limits.  HEENT:  His head is normocephalic and atraumatic.  Pupils are equal, round,  reactive to light and accommodation.  Extraocular movements are intact.  Sclerae are clear.  Nares are without discharge.  NECK:  There is no lymphadenopathy, thyromegaly, bruit, or JVD noted.  LUNGS:  Clear to auscultation bilaterally.  CARDIOVASCULAR:  His heart has regular rate and rhythm with S1 and S2 and no  significant murmurs, rubs, or gallops noted.  Peripheral pulses were 2+ in  all four extremities and no femoral bruits were appreciated.  SKIN:  No rashes or lesions are noted.  ABDOMEN:  Soft and nontender with active bowel sounds.  No  hepatosplenomegaly is noted by palpation.  EXTREMITIES:  There is no clubbing, cyanosis, or edema.  No rash, lesions or  petechiae are noted.  MUSCULOSKELETAL:  There is no joint deformity or effusions.  No spine or CVA  tenderness is noted.  NEUROLOGIC:  He is alert and oriented.  Cranial nerves II-XII grossly intact  and no focal deficits noted.   Chest x-ray:  Mild cardiomegaly and no acute disease.   EKG:  Sinus rhythm, rate 78 with normal axis and no acute ischemic changes.  This is unchanged from an EKG from 2004.   LABORATORY DATA:  Hemoglobin 15, hematocrit 43.7, wbc 8.3, platelets 309.  Sodium 136, potassium 4.3, chloride 105, CO2 24, BUN 15, creatinine 1.1,  glucose 90.  INR 1.4.  Point of care markers negative x2.   ASSESSMENT/PLAN:  1. Chest pain.  It is atypical but similar to his anginal symptoms and he     has a history of coronary artery disease.  He will be admitted overnight  and enzymes will be cycled.  If his enzymes are positive, he needs a     heart catheterization.  If negative, consideration can be given to an    outpatient Cardiolite.  Heparin and IV  nitroglycerin will be added to     his  medication regimen for now.  2. Hyperlipidemia.  Check in a.m. and add Zetia to his medication regimen as     he has been intolerant of all statins tried.  3. Hypertension.  His blood pressure is slightly elevated now but he takes     all his medications in the evening.  His home medications have been     ordered as  he takes them at home and will give his Norvasc as soon as     possible.  4. He is otherwise stable and will be continued on his home medications.   Dr. Lewayne Bunting saw the patient and determined the plan of care.      Theodore Demark, P.A. [facility]                  Learta Codding, M.D. [facility]    RB/MEDQ  D:  02/13/2004  T:  02/13/2004  Job:  [id number]   cc:   Carole Binning, M.D. [facility]   Dr. Merla Riches  [facility]  [address]

## 2010-11-29 NOTE — Cardiovascular Report (Signed)
NAME:  Scott Rollins, Scott Rollins NO.:  [id number]   MEDICAL RECORD NO.:  [id number]          PATIENT TYPE:  INP   LOCATION:  6531                         FACILITY:  [facility]   PHYSICIAN:  Scott Rollins, M.D. [facility] OF BIRTH:  08-13-1950   DATE OF PROCEDURE:  06/12/2004  DATE OF DISCHARGE:                              CARDIAC CATHETERIZATION   INDICATIONS:  Scott Rollins is a patient well known to [place].  He has had  multiple percutaneous coronary interventions.  He presents now with  recurrent chest pain and was admitted by Scott Rollins for repeat  catheterization.   PROCEDURE:  1.  Left heart catheterization.  2.  Selective coronary arteriography.  3.  Selective left ventriculography.   DESCRIPTION OF PROCEDURE:  The patient was brought to the catheterization  laboratory and prepped and draped in the usual fashion.  We used a Smart  needle to enter the right femoral artery and it was entered through an  anterior puncture. A 6 French sheath was placed.  Views of the left and  right coronary arteries were obtained in multiple angiographic projections.  The central aortic and left ventricular pressures were measured with a  pigtail.  Ventriculography was performed in the RAO projection.  The patient  was desirous of being enrolled in the Stradivarius study, but he had a  disqualifying angiogram with an 80% distal stenosis as previously noted on  the last study.   HEMODYNAMIC DATA:  1.  Central aortic pressure 135/91.  2.  Left ventricle 134/30.  3.  No gradient on pull back across the aortic valve.   ANGIOGRAPHIC DATA:  1.  Ventriculography was performed in the RAO projection.  Overall systolic      function appears well preserved with an ejection fraction in the 50-55%      range.  No definite wall motion abnormalities are seen.  2.  The left main is free of critical disease.  3.  The left anterior descending artery courses to the apex. Throughout the      proximal  and mid vessel, there is no evidence of high grade critical      stenosis.  There is minimal luminal irregularity.  At the distal most      aspect, there are multiple tandem lesions.  The first one is about 50.      The mid one is about 49 and there is a distal stenosis of about 50% as      well.  The whole distal vessel was somewhat diffusely diseased.  There      was a first diagonal branch that is small in caliber that has about a 70-      80% mid stenosis.  The second diagonal branch is a fairly large caliber      vessel that has diffuse proximal 70% narrowing and 75% narrowing leading      into an area of bifurcation.  This has been noted on previous studies.  4.  The circumflex has a previously placed stent in the mid vessel that has      about 30-40% diffuse narrowing.  Right in the middle of this is the take      off of an 80% stenosis in the small AV circumflex. The distal portion of      the marginal has some bifurcational irregularities of about 50% but it      is an extremely large caliber vessel.  5.  The right coronary artery has previously multiple stents.  The most      recent stent of March 05, 2004.  This is widely patent in the proximal      vessel with 0% renarrowing.  Just distal to this is about 30% narrowing      in a previously placed stent in an area of segmental plaquing in the mid      vessel of about 40% followed by a stented area of less than 30%.  The      distal vessel consisted of a posterior descending branch and three      posterolateral branches.  Straddling the posterolateral branches is      about a 50-60% area of diffuse plaquing.   CONCLUSIONS:  1.  Continued patency of the previously placed stent.  2.  Multiple small vessel abnormalities with involvement of the diagonal,      distal left anterior descending, distal portion of the circumflex and      findings as noted above.  3.  Collateralization of a distal circumflex branch whose origin is       uncertain from the distal right coronary circulation.  4.  Preserved overall left ventricular function.   RECOMMENDATIONS:  We will recommend continued medical therapy.  Follow up  will be with Scott Rollins.       TDS/MEDQ  D:  06/12/2004  T:  06/12/2004  Job:  [id number]   cc:   Scott Rollins, M.D. [facility]   Scott Rollins, M.D. [facility]

## 2010-11-29 NOTE — Cardiovascular Report (Signed)
NAME:  Scott Rollins, Scott Rollins NO.:  [id number]   MEDICAL RECORD NO.:  [id number]          PATIENT TYPE:  INP   LOCATION:  4705                         FACILITY:  [facility]   PHYSICIAN:  Lyn Records, M.D.   DATE OF BIRTH:  Oct 29, 1950   DATE OF PROCEDURE:  04/04/2005  DATE OF DISCHARGE:  04/04/2005                              CARDIAC CATHETERIZATION   INDICATIONS FOR PROCEDURE:  Progressive somewhat nonspecific symptoms of  chest pain, fatigue and dyspnea in a patient with prior complex coronary  history including circumflex stent and multiple right coronary interventions  including stents. Study is being done to define coronary anatomy and help  guide therapy.   PROCEDURE PERFORMED:  1.  Left heart catheterization.  2.  Selective coronary angiography.  3.  Left ventriculography.  4.  AngioSeal closure.   DESCRIPTION:  After informed consent, a 6-French sheath was placed in the  right femoral artery using modified Seldinger technique. A 6-French A2  multipurpose catheter was used for hemodynamic recordings, left  ventriculography by hand injection, and selective attempted coronary  angiography. We ultimately used a 6-French left and right Judkins catheters  for left and right coronary angiography. Following the procedure, AngioSeal  arteriotomy closure was performed after visualizing adequate anatomy for  such. Good hemostasis was achieved.   RESULTS:   HEMODYNAMIC DATA:  1.  Aortic pressure 163/104.  2.  Left ventricular pressure 164/28.   LEFT VENTRICULOGRAPHY:  Left ventricular cavity size and systolic function  are normal. No mitral regurgitation is noted.   CORONARY ANGIOGRAPHY:  1.  Left main coronary: Widely patent.  2.  Left anterior descending coronary: There is moderate diffuse disease in      the proximal and mid vessel. There is diffuse disease in the distal      vessel with up to 60-80% narrowing. The first diagonal arises from the      mid LAD  and bifurcates. The smaller branch beyond this bifurcation      contains severe diffuse disease. This vessel is less than 1-1/2 mm in      diameter. Proximal to this bifurcation of the diagonal, there is      eccentric 60-70% narrowing. A 50% narrowing of the larger branch beyond      the smaller branch origin.  3.  Circumflex artery: This is a large vessel that contains a stent. There      is an eccentric 30-50% narrowing within the region of the stent. The      distal circumflex portion of the vessel contains some very peripheral      diffuse disease but no significant high-grade obstruction.  4.  Right coronary: Right coronary artery is diffusely diseased throughout      the proximal mid and distal segment but no high-grade obstruction is      noted and 30-50% in-stent restenosis is seen.   CONCLUSION:  1.  Moderate left anterior descending artery, right coronary and circumflex      disease. No high-grade obstructions are noted in any of the proximal      major vascular territories. There is  a significant distal disease in the      first diagonal. The distal left anterior descending artery and the      distal portions of the right circumflex.  2.  Normal left ventricular function with elevated left ventricular end-      diastolic pressure.  3.  No change in overall appearance of coronary anatomy since the prior cath      June 12, 2004.   PLAN:  Continue aggressive medical therapy. Encourage to lose weight. No  other changes other than increasing labetalol to 300 milligrams twice a day.      Lyn Records, M.D.  Electronically Signed     HWS/MEDQ  D:  04/04/2005  T:  04/05/2005  Job:  [id number]   cc:   [facility]

## 2010-11-29 NOTE — Discharge Summary (Signed)
[name]  Patient:    Scott Rollins, Scott Rollins                   MRN: [id number] Adm. Date:  [id number] Disc. Date: [id number] Attending:  Alric Quan Dictator:   Lavella Hammock, P.A. CC:         Daisey Must, M.D. [facility]  Fayne Norrie, M.D.-[facility] [place], [facility]   Discharge Summary  DATE OF BIRTH:  1950-11-24  PROCEDURES: 1. Cardiac catheterization. 2. Coronary arteriogram. 3. Left ventriculogram. 4. PTCA and stent of one vessel.  HOSPITAL COURSE:  Scott Rollins is a 60 year old male with a history of coronary artery disease, who came to the hospital on March 23, 2000 complaining of a three week history of episodic chest pain.  He had been seen in the office and scheduled for a Cardiolite but this had not been done yet. He described the pain as both resting and exertional and radiating from his left chest into his left arm, neck and up into his ear.  This was relieved by nitroglycerin but occasionally needed two tablets.  He was admitted to rule out and for further evaluation.  He remained pain-free and his enzymes were negative for MI.  He was scheduled for a cardiac catheterization and this was done on March 24, 2000.  It showed a normal main and LAD with an 80% mid stenosis in a branch of the second diagonal with a 95% stenosis.  The circumflex had a 60% stenosis and the OM was small with a 90% stenosis.  There was a 75% stenosis in the distal circumflex.  The RCA had a 30% proximal and 95% mid stenosis with 50% in-stent restenosis in the distal RCA.  He had PTCA and stent to the proximal RCA reducing stenosis from 95 to 0 with TIMI-3 flow. His left ventriculogram showed normal wall motion with an EF of 58% and no MR.  He tolerated the procedure well and the sheath was removed without difficulty.  He was started on Plavix.  The next day the groin was stable with no hematoma or  bruit.  Additionally, he has his lipids checked and his total cholesterol was 225 with triglyceride of 378 and HDL 27 and LDL 122.  He was seen in research and enrolled in PROVEIT trial.  He was to be randomized and follow up with them for lipid lowering medication.  An additional problem was hypertension.  He had been on Cardura prior to admission but this was discontinued in the hospital and he was started on a beta blocker.  He had been on Altace and HCTZ prior to admission and this was continued.  By March 25, 2000 the patient was ambulating without difficulty and considered stable for discharge at that time.  RADIOLOGIC STUDIES:  Chest x-ray:  No active disease, mild cardiomegaly.  DISCHARGE CONDITION:  Improved.  CONSULTS:  None.  COMPLICATIONS:  None.  DISCHARGE DIAGNOSES: 1. Coronary artery disease, status post percutaneous transluminal    coronary angioplasty and stent to the distal right coronary artery in    1996, percutaneous transluminal coronary angioplasty and    high speed rotational atherectomy to the right coronary artery in 1999,    and percutaneous transluminal coronary angioplasty and stent to the    proximal right coronary artery this admission. 2. Hypertension. 3. Hyperlipidemia. 4. Family history of coronary artery disease. 5. Ongoing history of tobacco use. 6. Obesity. 7. History of  gastroesophageal reflux disease symptoms. 8. Possible history of obstructive sleep apnea. 9. Status post umbilical hernia repair.  DISCHARGE INSTRUCTIONS: 1. He is to do no sexual or strenuous activity for two days    and no driving for two days. 2. He is to stick to a low fat diet. 3. He is to call the office for bleeding, swelling or drainage    of the cath site. 4. He is to have a P.A. appointment on March 30, 2000 at    2:30 p.m. 5. He is to follow up with Dr. Gerri Spore on October 17,    at 11 a.m. 6. He is to obtain a primary care doctor.  DISCHARGE  MEDICATIONS: 1. PROVEIT study medication daily. 2. Coated aspirin 325 mg q.d. 3. Nitroglycerin 0.4 mg sublingual p.r.n. 4. Plavix 75 mg q.d. times one month. 5. Altace 10 mg q.d. 6. Lopressor 50 mg one tablet b.i.d. 7. Protonix 40 mg p.o. q.d. 8. HCTZ 12.5 mg p.o. q.d. DD:  03/25/00 TD:  03/25/00 Job: [id number]

## 2010-11-29 NOTE — Cardiovascular Report (Signed)
NAME:  Scott Rollins, Scott Rollins                      ACCOUNT NO.:  [id number]   MEDICAL RECORD NO.:  [id number]                   PATIENT TYPE:  INP   LOCATION:  3706                                 FACILITY:  [facility]   PHYSICIAN:  Arturo Morton. Riley Kill, M.D. [facility]         DATE OF BIRTH:  1951/01/26   DATE OF PROCEDURE:  03/01/2002  DATE OF DISCHARGE:                              CARDIAC CATHETERIZATION   INDICATIONS:  The patient is a pleasant 60 year old gentleman who has had  prior percutaneous coronary intervention.  Most recently, he has developed  progressive exertional angina and he was seen earlier today by Dr. Corinda Gubler  and his catheterization moved up. He is now brought to the lab for some  recurrent substernal ongoing chest pain.   PROCEDURES:  1. Left heart catheterization.  2. Selective coronary arteriography.  3. Selective left ventriculography.  4. Percutaneous coronary intervention and stenting of the mid circumflex     coronary artery.   DESCRIPTION OF PROCEDURE:  The patient was brought to the catheterization  lab and prepped and draped in the usual fashion. Through an anterior  puncture the right femoral artery was easily entered.  Views of the left and  right coronary arteries were obtained in multiple angiographic projections.  Ventriculography was performed in the RAO projection. The patient had  evidence of progressive disease of the circumflex artery with a subtotal  occlusion. His right coronary was still patent. There was scattered lesions  in the left system as previously described on the catheterization procedures  from before. With these findings, we talked about the options of either  percutaneous intervention or possibly a stent to the circumflex. We were not  sure whether or not a drug-eluting stent could be used in part because of  the length of the lesion. In addition, there is a steep circumflex bend.  Preparations were made for percutaneous intervention.   I then discussed the case with the patient's spouse and also with the  patient. The decision was made to proceed on with percutaneous intervention.  We used heparin and Integrilin according to procedure. A 3.5 Voda catheter  was utilized as well as a Immunologist. We were actually able to get down the  artery fairly easily with a wire. The vessel was then pre-dilated using a 30  mm length CrossSail balloon.  Of note, the balloon was not as long as the  lesion and it was felt to adequately cover with a drug-eluting stent, which  required 38 mm which does not meet the criteria of [place] and [place].  I  therefore elected to place a 3 mm x 38 mm length Zeta stent. The artery was  then dilated with the stent was then crossed with a 3.5 mm CrossSail balloon  and post-dilated throughout the course of the stent up to about 12 to 14  atmospheres. The 95% stenosis was reduced to less than 10% residual luminal  narrowing. An appropriate  ACT was obtained at the beginning of the procedure  at 3:24.  All catheters were subsequently removed and a femoral sheath sewn  into place. The patient was given Nubain for back pain.   HEMODYNAMIC DATA:  1. Central aorta 162/104.  2. Left ventricular 154/19.  3. No gradient on pullback across the aortic valve.   ANGIOGRAPHIC DATA:  1. Left ventriculography was performed in the RAO projection. There appeared     to be a low-normal ejection fraction.  Because of ventricular ectopy, it     was difficulty to calculate and there did not appear to be significant     mitral regurgitation nor was there a definite wall motion abnormality.  2. The left main coronary artery was free of critical disease.  3. The left anterior descending artery coursed to the apex.  There was some     luminal irregularity throughout with about a 30 to no more than 40% mid     lesion. The first diagonal was a smaller caliber vessel and had 70% mid     narrowing. The second diagonal had a 70%  segmental area in a bend     followed by subtotal occlusion in the small marginal sub-branch; this has     been previously noted. There was another sub-branch that was fairly large     and without critical narrowing.  4. The circumflex consisted of four branches.  The first marginal branch was     relatively small and without critical disease. The second marginal branch     was small and had perhaps 70% ostial narrowing.  After this, there is a long 95% stenosis that then becomes about 70%  extending into the third marginal branch. Distally, the third marginal  branch is extremely large caliber vessel. There is a sub-branch of this that  is totally occluded and fills by right to left collaterals.  The AV  circumflex is a small and provides a small forth marginal.  Following  stenting, this lesion in the mid circumflex that extends into the third  marginal was reduced to 95% down to about 10% residual narrowing.  1. The right coronary artery has been previously stented.  There is 30%     proximal narrowing then a long area of 30% narrowing in the mid vessel at     the previous site of stenting. The distal vessel opens up and is free of     critical disease. The large acute marginal branch has probably 40-50%     proximal narrowing but appears to be patent.   CONCLUSION:  1. Continued patency of the right coronary artery from previous stenting     procedures.  2. Unchanged left anterior descending artery and branches.  3. Progressive high-grade disease of the mid circumflex extending into the     third obtuse marginal with successful percutaneous stenting using a non     drug-eluting stent because of the stent length in excess of 30 mm.   DISPOSITION:  The patient will be treated with aspirin and Plavix.  He will  follow up in the [facility]  The need to place a non drug-eluting stent  has been explained to the patient.  Arturo Morton. Riley Kill, M.D. [facility]    TDS/MEDQ  D:  03/01/2002  T:  03/03/2002  Job:  [id number]   cc:   CV Laboratory   Salvadore Farber, MD [facility]  [address] [place], [place] [address]  Fax: 1   Carole Binning, M.D. [facility]

## 2010-11-29 NOTE — H&P (Signed)
NAME:  Scott Rollins, Scott Rollins NO.:  [id number]   MEDICAL RECORD NO.:  [id number]          PATIENT TYPE:  INP   LOCATION:  4705                         FACILITY:  [facility]   PHYSICIAN:  Lyn Records, M.D.   DATE OF BIRTH:  09/13/1950   DATE OF ADMISSION:  04/03/2005  DATE OF DISCHARGE:                                HISTORY & PHYSICAL   REASON FOR ADMISSION:  Progressive fatigue and chest discomfort in a patient  with a history of coronary artery disease.   SUBJECTIVE:  Scott Rollins is 60 and has a history of hypertension, hyperlipidemia,  obesity, and coronary artery disease.  He has had previous myocardial  infarctions according to the patient.  He is known to have had prior right  coronary intervention in 2004 with a drug-eluting stent and has also had  circumflex stent implantation.  His most recent cardiac catheterization was  in November of 2005 that demonstrated patent LAD, patent circumflex, patent  RCA, but with between 30-60% stenosis in multiple areas in all three  territories.  No intervention was performed at that time.  There may be more  than one stent in the right coronary.  Over the past six months and  progressively over the past two to three weeks the patient has been  fatigued, unable to ambulate and participate in his work efforts as usual  and having vague complaints in his chest.  He had a particularly prolonged  and severe episode last p.m.  He states that this kind of rundown feeling is  what he always experiences when his heart situation starts to act up.   CURRENT MEDICAL PROBLEMS:  1.  Hypertension.  2.  Hyperlipidemia.  3.  History of migraines.  4.  Obesity.  5.  Sleep apnea.  6.  Restless leg syndrome.   ALLERGIES:  ZETIA, ELAVIL.   MEDICATIONS:  1.  Labetalol 200 mg twice a day.  2.  Plavix 75 mg per day.  3.  Imipramine 25 mg q.a.m., 50 mg every p.m.  4.  Aspirin 325 mg per day.  5.  Diltiazem SR 240 mg per day.  6.  Cardura 2 mg each  evening.  7.  Flexeril 10 mg each evening.  8.  Niacin 500 mg daily.  9.  Lisinopril 20 mg daily.  10. Crestor 10 mg per day.   SOCIAL HISTORY:  He is married.  Lives in [place].  Works with the  government Estate manager/land agent).   HABITS:  Does not drink.  Smoked for 40+ years, but quit approximately six  to eight months ago.   REVIEW OF SYSTEMS:  Reflux-type symptoms, exertional dyspnea, fatigue,  recurring chest pain, difficulty sleeping.  States he does wear a CPAP mask  at night.  No tachy palpitations.  No syncope.   PHYSICAL EXAMINATION:  GENERAL:  Patient is markedly obese abdomen.  SKIN:  Clear.  HEENT:  Unremarkable.  Extraocular movements are full.  NECK:  No JVD, carotid bruits, or thyromegaly.  LUNGS:  Clear to auscultation and percussion.  CARDIAC:  No gallop, no rub, no click, no murmur.  ABDOMEN:  Distended.  Liver and spleen are not palpable.  Bowel sounds are  normal.  EXTREMITIES:  No edema.  Pulses are 2+ and symmetric in the upper and lower  extremities.  NEUROLOGIC:  Grossly normal.   EKG is normal.  Chest x-ray unremarkable other than mild cardiomegaly.   ASSESSMENT:  1.  Somewhat atypical presentation in this 60 year old gentleman with      multiple right coronary stents and circumflex stent.  He states there      has been a gradual decline in his overall functional status with      increasing episodes of chest pain and dyspnea.  Rule out progressive      coronary disease.  Rule out other.  2.  Sleep apnea.  3.  Hypertension not well controlled.   PLAN:  1.  Rule out myocardial infarction with serial enzymes.  2.  Serial EKGs.  3.  Will propose diagnostic cardiac catheterization as a way to define      coronary anatomy and help guide therapy.  4.  Encourage weight loss.  5.  Dietary counseling.      Lyn Records, M.D.  Electronically Signed     HWS/MEDQ  D:  04/03/2005  T:  04/04/2005  Job:  [id number]   cc:   Molly Maduro L. Foy [place], M.D.  Fax:  [phone]

## 2010-11-29 NOTE — Cardiovascular Report (Signed)
[place]  Patient:    Scott Rollins, FRIEDHOFF                   MRN: [id number] Proc. Date: 03/24/00 Adm. Date:  [id number] Disc. Date: [id number] Attending:  Alric Quan CC:         [facility] [address]  Cecil Cranker, M.D. [facility]  Cardiac Catheterization Laboratory   Cardiac Catheterization  PROCEDURES PERFORMED: 1. Left heart catheterization with coronary angiography, left    ventriculography and abdominal aortography. 2. Percutaneous transluminal coronary angioplasty with stent placement    in the proximal right coronary artery.  INDICATIONS:  Mr. Lindahl is a 60 year old male who has a history of prior interventions to the right coronary artery including placement of a Gianturco-Roubin stent in 1996 followed by rotational atherectomy and percutaneous transluminal coronary angioplasty the right coronary artery for in-stent re-stenosis in 1999.  He was admitted to the hospital with unstable angina yesterday and referred for cardiac catheterization.  CATHETERIZATION PROCEDURE:  A 6 French sheath was placed in the right femoral artery.  Standard Judkins 6 French catheters were utilized.  Contrast was Omnipaque.  There were no complications.  RESULTS:  HEMODYNAMICS:  Left ventricular pressure 154/31.  Aortic pressure 154/100. There was no aortic valve gradient.  LEFT VENTRICULOGRAM:  Wall motion is normal.  Ejection fraction calculated at 58%.  No mitral regurgitation.  ABDOMINAL AORTOGRAM:  Abdominal aortogram reveals normal abdominal aorta renal arteries and iliac arteries.  CORONARY ARTERIOGRAPHY:  (Right dominant).  Left main:  Normal.  Left anterior descending:  The left anterior descending has a 30% stenosis in the mid vessel across the origin of the second diagonal branch.  There is a small first diagonal and a large second diagonal.  The second diagonal has a 70% stenosis in the main branch and  a 95% stenosis in a small branch.  Left circumflex:  The left circumflex has a 60% stenosis in the mid vessel. The circumflex gives rise to a small OM-1, small OM-2, a large branching OM-3 and a small OM-4.  OM-3 has a 75% stenosis in the proximal portion.  OM-4 has a 90% stenosis but this is a relatively small vessel.  Right coronary artery:  The right coronary artery is a dominant vessel.  In the proximal vessel is a diffuse 30% stenosis followed by a short ectatic segment.  Just after this is a 95% stenosis.  In the mid vessel is a 30% stenosis followed by 50% stenosis at the acute margin within the previously placed Gianturco-Roubin stent.  The distal right coronary artery gives rise to a large acute marginal branch which supplies the apical portion of the inferior septum.  There is a small posterior descending artery, a small first posterolateral branch, normal sized second posterolateral branch, and a normal sized third posterolateral branch.  IMPRESSION: 1. Normal left ventricular systolic function. 2. Three-vessel coronary artery disease as described.  The culprit lesion has    a 95% stenosis in the proximal right coronary artery which arises proximal    to the previously placed stent.  There is moderate disease in the left    circumflex system as described as well as in the second diagonal branch.    These vessels are similar to that seen on previous catheterization.  PLAN:  Percutaneous intervention of the right coronary artery; see below.  PERCUTANEOUS TRANSLUMINAL CORONARY ANGIOPLASTY PROCEDURE:  Following completion of diagnostic catheterization we proceeded with coronary  intervention.  The patient had been started on Integrilin prior to coming to the catheterization laboratory.  Integrilin was continued and heparin was administered to achieve an ACT of greater than 200 seconds.  We used a 7 [place] guiding catheter with side holes and a BMW wire.  Predilatation  of the lesion was performed with a 3.0 x 15 mm Quantum Ranger balloon inflated to 10 atmospheres.  This resulted in a small dissection in the proximal vessel at the proximal edge where the balloon was.  We therefore placed a 3.0 x 15 mm NIR Elite stent to cover both the primary lesion as well as the small dissection proximally.  This stent was deployed at 14 atmospheres.  It was necessary to deploy this stent across the ectatic area of vessel.  We therefore post dilated the stent within the midportion of the stent with a 3.5 x 9 mm Quantum Ranger balloon to better oppose this to the vessel wall in the area of ectasia.  Final angiographic images revealed a patent vessel with 0% residual stenosis at the stent site and TIMI-3 flow into the distal vessel. There was a moderate sized right ventricular branch which arose from within the stented area which had a patent lumen but TIMI-2 flow into the distal branch.  The patient had mild chest discomfort; however, this was mostly resolved by the end of the procedure.  COMPLICATIONS:  None.  RESULTS:  Successful PTCA with stent placement in the proximal right coronary artery reducing the 95% stenosis to 0% residual to TIMI-3 flow.  PLAN:  Integrilin will be continued for an additional 24 hours.  Plavix will be administered for four weeks.  The patient needs aggressive risk factor reduction and medical therapy for his remaining coronary artery disease. DD:  03/24/00 TD:  03/24/00 Job: [id number]

## 2010-11-29 NOTE — Cardiovascular Report (Signed)
[facility]  Patient:    Scott Rollins, Scott Rollins                   MRN: [id number] Proc. Date: 09/22/00 Adm. Date:  [id number] Disc. Date: [id number] Attending:  Colon Branch CC:         [facility]                        Cardiac Catheterization  PROCEDURES PERFORMED:  Left heart catheterization, left coronary angiography and left ventriculography.  INDICATION:  [name] is a 60 year old male with a history of coronary artery disease.  He is status post stent x 2 to the right coronary artery. Most recently, he had a stent placed in the proximal and mid right coronary artery in September of last year.  He presented with recurrent chest pain and is referred for cardiac catheterization.  PROCEDURAL NOTE:  A 6-French sheath was placed in the right femoral artery. Standard Judkins 6-French catheters were utilized.  Contrast was Omnipaque. There were no complications.  RESULTS: Hemodynamics:  Left ventricular pressure 150/20.  Aortic pressure 150/98. There was no aortic valve gradient.  Left ventriculogram:  Wall motion is normal.  Ejection fraction is calculated at 65%.  There is no mitral regurgitation.  Coronary arteriography (right dominant): 1. Left main is normal. 2. Left anterior descending artery has a 20% stenosis in the mid-vessel.  The    LAD gives rise to a small first diagonal and normal-size second diagonal.    The first diagonal has a 70% stenosis in the main body.  There is a small    medial branch of the first diagonal which shows a 95% stenosis. 3. Left circumflex has a tubular 75% stenosis in the mid-vessel.  Just distal    to this is a second tubular 75% stenosis extending into the proximal    portion of a large third obtuse marginal branch.  The circumflex gives rise    to a small first marginal, small second marginal, a large third marginal    and a small fourth marginal.  The third marginal has the 75%  stenosis in    its proximal portion as described above.  The fourth marginal has a focal    95% stenosis but is a relatively small vessel. 4. Right coronary artery is a dominant vessel.  The proximal right coronary    has a 20% stenosis.  The proximal and mid-vessel, there is a    3.0 x 15.0-mm NIR stent which is widely patent with less than 20% stenosis    within the stent.  In the mid-to-distal vessel is a Gianturco-Roubin stent    which itself is also widely patent with approximately 20% stenosis within    the midportion of this stent.  There is a large acute marginal branch which    has a 30% stenosis at its origin.  This acute marginal supplies a portion    of the inferior septum.  The distal right coronary artery gives rise to a    small posterior descending artery, small first posterolateral branch,    normal-size second and third posterolateral branches.  IMPRESSION: 1. Normal left ventricular systolic function. 2. Two-vessel coronary artery disease as described with moderate disease    involving the main body of the second diagonal as well as moderate disease    in the mid left circumflex and third obtuse marginal branch; in addition,  there is significant disease in the small branch vessels as described.    These images were reviewed with the findings on most recent catheterization    in September of last year.  The disease in the main body of the diagonal    and in the circumflex and obtuse marginal #3 is stable.  The disease in the    branch of the diagonal is stable.  The disease in the small fourth obtuse    marginal branch may have increased in severity slightly.  PLAN:  Patient will be treated medically for the time-being with adjustment of his anti-anginal medications.  We will perform an outpatient stress Cardiolite to rule out significant ischemia in the left circumflex distribution.  If this does show significant ischemia in this territory, we could  consider percutaneous intervention to the left circumflex, although this would be relatively complex involving a fairly long area of vessel including a bifurcation point. DD:  09/22/00 TD:  09/23/00 Job: [id number]

## 2010-11-29 NOTE — Discharge Summary (Signed)
NAME:  Scott Rollins, Scott Rollins                      ACCOUNT NO.:  [id number]   MEDICAL RECORD NO.:  [id number]                   PATIENT TYPE:  INP   LOCATION:  6527                                 FACILITY:  [facility]   PHYSICIAN:  Lavella Hammock, PA [facility]              DATE OF BIRTH:  12/15/1950   DATE OF ADMISSION:  02/28/2002  DATE OF DISCHARGE:  03/02/2002                           DISCHARGE SUMMARY - REFERRING   PROCEDURE:  1. Cardiac catheterization.  2. Coronary arteriogram.  3. Left ventriculogram.  4. PTCA and stent of one vessel.   HOSPITAL COURSE:  The patient is a 60 year old male with history of PTCA x 3  to the RCA as well as stent placements.  Since 2001, he has been chest-pain  free until the last 2-3 weeks he began having chest pain with light-to-  moderate exertion.  He began having resting pain on the 2 days before  admission and was admitted to the hospital on 02/28/2002 to rule out MI and  for further evaluation.  His enzymes were negative for MI and it was felt  that a cardiac catheterization was indicated.   Patient had a cardiac catheterization on 03/01/2002 and it showed a normal  left main and an LAD with 30% stenosis at a previous stent sight.  And the  first diagonal had a 70% stenosis and a branch of the second diagonal had a  95% stenosis.  The RCA had 30% end-stent restenosis in 2 stents and a branch  of the RCA had a 40-50% stenosis.  The circumflex had a 95% stenosis in the  mid portion and then a 70% stenosis as well.  The OM-2 had a 70% stenosis  and another branch was total.  A third branch of the OM had 10% stenosis.  His EF was low normal.  He had PTCA in stent to the left circumflex reducing  the stenosis from 95% to less than 10%.  There was no significant MR on the  left ventriculogram.  He was started on aspirin and Plavix.   The next day, his groin was stable with minimal ecchymosis and no bruit or  hematoma.  He was motivated to quit smoking and  was started on Zyban.  He  also requested a nutrition consult for a low-fat diet and weight loss  program.  He was seen by the nutrition educator for cholesterolemic dietary  changes.  The patient was initially started on Lipitor at 80 mg a day but do  not have a recent cholesterol profile so he will follow up in the office  with this.   Since he was ambulating without difficulty, he was considered stable for  discharge on 03/02/2002.   LABORATORY DATA:  Sodium 139, potassium 4.0, chloride 1.5, CO2 28, BUN 9,  creatinine 1.0, glucose 105.  Serial CK-MB negative for MI.  Hemoglobin  15.6, hematocrit 45.1, WBCs 9.4, platelets 328.   Chest x-ray:  Cardiomegaly with no active disease, scarring in the right  base.   CONDITION ON DISCHARGE:  Improved.   DISCHARGE DIAGNOSIS:  1. Chest pain consistent with unstable anginal pain, status post stent with     circumflex this admission.  2. Stents with percutaneous transluminal coronary angioplasty x 3 with stent     placement to the RCA.  3. Preserved left ventricular function.  4. Hypertension.  5. History of hyperlipidemia.  6. Gastroesophageal reflux disease.  7. History of obesity.  8. Degenerative joint disease.  9. Family history of coronary artery disease.  10.      Status post umbilical hernia repair.   DISCHARGE INSTRUCTIONS:  1. His activity level is to include no driving for 2 days.  2. No strenuous activity for one week.  3. He was given a note to return to work next Monday.  4. He should call the office for problems with the cath site.  He has an     appointment with Dr. Corinda Gubler, September 17 at 3:00 p.m.  5. He is to follow up with Dr. Caryl Never as needed.   DISCHARGE MEDICATIONS:  1. Altace 10 mg q.d.  2. Norvasc 10 mg q.d.  3. Enteric-coated aspirin 325 mg q.d.  4. Chlor-Trimeton  8 mg q.d.  5. Celebrex 200 mg q.d.  6. Nitroglycerin p.r.n.  7. Zyban 1 p.o. q.d. x 3 days then b.i.d.  8. Plavix 75 mg  q.d.                                                 Lavella Hammock, PA [facility]    RG/MEDQ  D:  03/02/2002  T:  03/04/2002  Job:  [id number]   cc:   Cecil Cranker, M.D. [facility]   Kristian Covey, M.D.

## 2010-12-23 ENCOUNTER — Encounter: Payer: 59 | Admitting: Cardiology

## 2011-01-02 ENCOUNTER — Ambulatory Visit (INDEPENDENT_AMBULATORY_CARE_PROVIDER_SITE_OTHER): Payer: 59 | Admitting: Internal Medicine

## 2011-01-02 ENCOUNTER — Encounter: Payer: Self-pay | Admitting: Internal Medicine

## 2011-01-02 VITALS — BP 155/80 | HR 69 | Resp 20 | Ht 70.0 in | Wt 310.0 lb

## 2011-01-02 DIAGNOSIS — J438 Other emphysema: Secondary | ICD-10-CM

## 2011-01-02 DIAGNOSIS — I10 Essential (primary) hypertension: Secondary | ICD-10-CM

## 2011-01-02 DIAGNOSIS — E782 Mixed hyperlipidemia: Secondary | ICD-10-CM

## 2011-01-02 DIAGNOSIS — I2510 Atherosclerotic heart disease of native coronary artery without angina pectoris: Secondary | ICD-10-CM

## 2011-01-02 NOTE — Patient Instructions (Signed)
Your physician wants you to follow-up in: January 2013 You will receive a reminder letter in the mail two months in advance. If you don't receive a letter, please call our office to schedule the follow-up appointment.  

## 2011-01-03 NOTE — Progress Notes (Signed)
HPI  Patient is a 60 year old who was self referred for continued cardiac care. The pt had been followed in the past by M. Pulsipher.  Records in storage. He switched his care to H. Katrinka Blazing as his primary MD was also an [place] physician  The patient has a history of CAD.  S/P mult PCIs to the Lcx and RCA.  Last cath in 2008 wshowed :VEF of 60%.  RCA with patent prox and mid stent.  Gap between stents showed 50 to 60% stenosis.  LM normal.  Subbranch of Dz had 90% narroiwing and 70% distal.  LCx had patent stent  Last myoview in 2009 was without ischemia The patient has had intermittent chest pains since then.  Not associated with activity.  NO change in frequency. He has a history of SOB with signif COPD.  He is followed in a clinical trial by Dr. Dayton Scrape. He was recently diagnosed with prostate Ca.  He is on hormone therapy now.  Plan for XRT and seed placement.  Will require general anesthesia. He says he quit smoking about 1 month ago. Allergies  Allergen Reactions  . Amitriptyline Hcl     REACTION: nausea, elevated bp  . Ezetimibe     REACTION: chest pain, fatigue    Current Outpatient Prescriptions  Medication Sig Dispense Refill  . albuterol (PROAIR HFA) 108 (90 BASE) MCG/ACT inhaler Inhale 2 puffs into the lungs every 4 (four) hours as needed.        Marland Kitchen albuterol (PROVENTIL) (2.5 MG/3ML) 0.083% nebulizer solution Take 2.5 mg by nebulization every 4 (four) hours as needed.        Marland Kitchen aspirin 325 MG tablet Take 325 mg by mouth daily.        . bisoprolol-hydrochlorothiazide (ZIAC) 5-6.25 MG per tablet Take 1 tablet by mouth daily.        . citalopram (CELEXA) 40 MG tablet Take 40 mg by mouth daily.        . cyclobenzaprine (FLEXERIL) 10 MG tablet Take 10 mg by mouth daily.        Marland Kitchen doxazosin (CARDURA) 2 MG tablet Take 2 mg by mouth at bedtime.        . furosemide (LASIX) 40 MG tablet Take 40 mg by mouth daily as needed.        . hydrochlorothiazide 25 MG tablet Take 25 mg by mouth daily.         Marland Kitchen lisinopril (PRINIVIL,ZESTRIL) 20 MG tablet Take by mouth 2 (two) times daily.       Marland Kitchen lovastatin (MEVACOR) 20 MG tablet Take 20 mg by mouth 2 (two) times daily.        . niacin 500 MG tablet Take 500 mg by mouth at bedtime.        . NON FORMULARY COPD STUDY DRUG       . Potassium 99 MG TABS Take 1 tablet by mouth daily.          Past Medical History  Diagnosis Date  . OSA on CPAP     RestMed S6 machine, set at 4.6-6.4  . Osteoarthrosis, unspecified whether generalized or localized, unspecified site     DDD  . Hyperlipidemia   . Hypertension   . Coronary atherosclerosis of unspecified type of vessel, native or graft 2005    MI, s/p PCI with stent (pt reports 20+ interventions in the past, most recent cat 6/08 showed patent stents).  Normal LV function.  Nuclear stress test NEG 8/09.  Marland Kitchen  COPD (chronic obstructive pulmonary disease)   . Depression     ?bipolar dx by psychiatrist?  . Anxiety   . 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 ED (erectile dysfunction)   . Obesities, morbid   . 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 Diverticulosis 2006  . Cancer   . Prostate cancer 09/2010    Localized, high risk disease (Dr. Aldean Ast)    No past surgical history on file.  Family History  Problem Relation Age of Onset  . Arthritis Mother   . Heart disease Father   . Bipolar disorder Father     History   Social History  . Marital Status: Married    Spouse Name: [name]    Number of Children: N/A  . Years of Education: N/A   Occupational History  . [employer]    Social History Main Topics  . Smoking status: Former Smoker -- 1.0 packs/day    Types: Cigarettes    Quit date: 07/18/2010  . Smokeless tobacco: Not on file   Comment: 2ppd for 46 years, currently 1 ppd  . Alcohol Use: No  . Drug Use: No  . Sexually Active: Not on file   Other Topics Concern  . Not on file   Social History Narrative  . No narrative on file     Review of Systems:  All systems reviewed.  They are negative to the above problem except as previously stated.  Vital Signs: BP 155/80  Pulse 69  Resp 20  Ht 5\' 10"  (1.778 m)  Wt 310 lb (140.615 kg)  BMI 44.48 kg/m2  Physical Exam Patient in NAD  HEENT:  Normocephalic, atraumatic. EOMI, PERRLA.  Neck: JVP is normal. No thyromegaly. No bruits.  Lungs: Decreased air flow.   No rales no wheezes.  Heart: Regular rate and rhythm. Normal S1, S2. No S3.   No significant murmurs. PMI not displaced.  Abdomen:  Supple, nontender. Normal bowel sounds. No masses. No hepatomegaly.  Extremities:   Good distal pulses throughout. No lower extremity edema.  Musculoskeletal :moving all extremities.  Skin:  Diffuse sweating. Neuro:   alert and oriented x3.  CN II-XII grossly intact.  EKG:  NSR.  69 bpm. Assessment and Plan:

## 2011-01-12 NOTE — Assessment & Plan Note (Signed)
I am not convinced of any active ischemia.  Overall I think he is at low risk for seed implant.  I would not pursue further testing.

## 2011-01-12 NOTE — Assessment & Plan Note (Addendum)
Will need to be followed.  Needs to watch carbohydrates.

## 2011-01-12 NOTE — Assessment & Plan Note (Signed)
SOunds significant on exam.  I will get PFTs from Dr. Rennie Plowman office.

## 2011-01-12 NOTE — Assessment & Plan Note (Signed)
BP a little high.  I would recommend following.

## 2011-01-13 ENCOUNTER — Ambulatory Visit
Admission: RE | Admit: 2011-01-13 | Discharge: 2011-01-13 | Disposition: A | Discharge status: Home / Self Care | Payer: 59 | Source: Ambulatory Visit | Attending: Radiation Oncology | Admitting: Radiation Oncology

## 2011-01-13 ENCOUNTER — Telehealth: Payer: Self-pay | Admitting: *Deleted

## 2011-01-13 DIAGNOSIS — R5383 Other fatigue: Secondary | ICD-10-CM | POA: Insufficient documentation

## 2011-01-13 DIAGNOSIS — Z510 Encounter for antineoplastic radiation therapy: Secondary | ICD-10-CM | POA: Insufficient documentation

## 2011-01-13 DIAGNOSIS — C61 Malignant neoplasm of prostate: Secondary | ICD-10-CM | POA: Insufficient documentation

## 2011-01-13 DIAGNOSIS — N200 Calculus of kidney: Secondary | ICD-10-CM | POA: Insufficient documentation

## 2011-01-13 DIAGNOSIS — R5381 Other malaise: Secondary | ICD-10-CM | POA: Insufficient documentation

## 2011-01-13 DIAGNOSIS — N139 Obstructive and reflux uropathy, unspecified: Secondary | ICD-10-CM | POA: Insufficient documentation

## 2011-01-13 DIAGNOSIS — R300 Dysuria: Secondary | ICD-10-CM | POA: Insufficient documentation

## 2011-01-13 DIAGNOSIS — M259 Joint disorder, unspecified: Secondary | ICD-10-CM | POA: Insufficient documentation

## 2011-01-13 NOTE — Telephone Encounter (Signed)
Yes, needs to be rx'd by his urologist.--PM

## 2011-01-13 NOTE — Telephone Encounter (Signed)
Faxed request faxed back to Dr. Valda Lamb office.

## 2011-01-13 NOTE — Telephone Encounter (Signed)
Faxed request from [facility] received from Dr. Valda Lamb office (he was the original recipient).  We have never refilled this med.  Do you want to refill or have Dr. Valda Lamb office refill as this is a prostate cancer med?  Please advise.

## 2011-02-13 ENCOUNTER — Ambulatory Visit
Admission: RE | Admit: 2011-02-13 | Discharge: 2011-02-13 | Disposition: A | Discharge status: Home / Self Care | Payer: 59 | Source: Ambulatory Visit | Attending: Urology | Admitting: Urology

## 2011-02-13 ENCOUNTER — Other Ambulatory Visit: Payer: Self-pay | Admitting: Urology

## 2011-02-13 DIAGNOSIS — Z01811 Encounter for preprocedural respiratory examination: Secondary | ICD-10-CM

## 2011-02-18 ENCOUNTER — Telehealth: Payer: Self-pay | Admitting: Internal Medicine

## 2011-02-18 LAB — CBC
HCT: 41.4 % (ref 39.0–52.0)
MCV: 87.5 fL (ref 78.0–100.0)
RDW: 12.7 % (ref 11.5–15.5)
WBC: 7.8 10*3/uL (ref 4.0–10.5)

## 2011-02-18 LAB — COMPREHENSIVE METABOLIC PANEL
AST: 14 U/L (ref 0–37)
Albumin: 3.5 g/dL (ref 3.5–5.2)
Chloride: 101 mEq/L (ref 96–112)
Creatinine, Ser: 0.87 mg/dL (ref 0.50–1.35)
Total Bilirubin: 0.2 mg/dL — ABNORMAL LOW (ref 0.3–1.2)
Total Protein: 6.7 g/dL (ref 6.0–8.3)

## 2011-02-18 LAB — APTT: aPTT: 34 seconds (ref 24–37)

## 2011-02-18 NOTE — Telephone Encounter (Signed)
Faxed EKG to [facility] (Dr. Dayton Scrape) at [facility] ([phone]).

## 2011-02-19 ENCOUNTER — Ambulatory Visit: Payer: 59 | Admitting: Family Medicine

## 2011-02-24 ENCOUNTER — Ambulatory Visit (HOSPITAL_COMMUNITY): Payer: 59

## 2011-02-24 ENCOUNTER — Observation Stay (HOSPITAL_COMMUNITY)
Admission: AD | Admit: 2011-02-24 | Discharge: 2011-02-25 | Disposition: A | Discharge status: Home / Self Care | Payer: 59 | Source: Ambulatory Visit | Attending: Urology | Admitting: Urology

## 2011-02-24 ENCOUNTER — Ambulatory Visit (HOSPITAL_BASED_OUTPATIENT_CLINIC_OR_DEPARTMENT_OTHER)
Admission: RE | Admit: 2011-02-24 | Discharge: 2011-02-24 | Payer: 59 | Source: Ambulatory Visit | Attending: Urology | Admitting: Urology

## 2011-02-24 DIAGNOSIS — J449 Chronic obstructive pulmonary disease, unspecified: Secondary | ICD-10-CM | POA: Insufficient documentation

## 2011-02-24 DIAGNOSIS — I252 Old myocardial infarction: Secondary | ICD-10-CM | POA: Insufficient documentation

## 2011-02-24 DIAGNOSIS — Z79899 Other long term (current) drug therapy: Secondary | ICD-10-CM | POA: Insufficient documentation

## 2011-02-24 DIAGNOSIS — C61 Malignant neoplasm of prostate: Principal | ICD-10-CM | POA: Insufficient documentation

## 2011-02-24 DIAGNOSIS — I10 Essential (primary) hypertension: Secondary | ICD-10-CM | POA: Insufficient documentation

## 2011-02-24 DIAGNOSIS — F319 Bipolar disorder, unspecified: Secondary | ICD-10-CM | POA: Insufficient documentation

## 2011-02-24 DIAGNOSIS — J4489 Other specified chronic obstructive pulmonary disease: Secondary | ICD-10-CM | POA: Insufficient documentation

## 2011-02-24 DIAGNOSIS — E785 Hyperlipidemia, unspecified: Secondary | ICD-10-CM | POA: Insufficient documentation

## 2011-02-24 DIAGNOSIS — G4733 Obstructive sleep apnea (adult) (pediatric): Secondary | ICD-10-CM | POA: Insufficient documentation

## 2011-02-24 DIAGNOSIS — Z01812 Encounter for preprocedural laboratory examination: Secondary | ICD-10-CM | POA: Insufficient documentation

## 2011-02-24 DIAGNOSIS — I2510 Atherosclerotic heart disease of native coronary artery without angina pectoris: Secondary | ICD-10-CM | POA: Insufficient documentation

## 2011-02-24 HISTORY — PX: INSERTION PROSTATE RADIATION SEED: SUR718

## 2011-02-25 ENCOUNTER — Ambulatory Visit (HOSPITAL_BASED_OUTPATIENT_CLINIC_OR_DEPARTMENT_OTHER): Admission: RE | Admit: 2011-02-25 | Payer: Self-pay | Source: Ambulatory Visit | Admitting: Urology

## 2011-02-26 ENCOUNTER — Ambulatory Visit: Payer: 59 | Admitting: Family Medicine

## 2011-03-05 NOTE — Op Note (Signed)
NAME:  Scott Rollins, Scott Rollins NO.:  [id number]  MEDICAL RECORD NO.:  [id number]  LOCATION:  1412                         FACILITY:  [facility]  PHYSICIAN:  Valetta Fuller, MD    DATE OF BIRTH:  May 21, 1951  DATE OF PROCEDURE:  02/24/2011 DATE OF DISCHARGE:  02/25/2011                              OPERATIVE REPORT   PREOPERATIVE DIAGNOSIS:  Adenocarcinoma of the prostate.  POSTOPERATIVE DIAGNOSIS:  Adenocarcinoma of the prostate.  PROCEDURE PERFORMED:  Nucletron robotic prostatic seed implantation with flexible cystoscopy.  SURGEON:  Valetta Fuller, MD and Maryln Gottron, M.D.  ANESTHESIA:  General.  INDICATIONS:  Scott Rollins was originally diagnosed with prostate cancer under the care of Dr. Aldean Ast.  The patient was diagnosed with high risk clinical stage T1c disease.  The patient's PSA was minimally elevated, but he did have a larger volume Gleason 8 adenocarcinoma of the prostate.  The patient was started on total androgen deprivation therapy approximately 3-4 months ago.  The patient has completed 5 weeks of external beam radiation therapy.  He has not had any significant complications other than some increased voiding symptoms which have responded fairly well to alpha-blocker therapy.  The patient now presents for seed boost.  The patient had previously been consulted by Dr. Chipper Herb and Dr. Aldean Ast with regard to treatment options and this plan had been chosen and was in place.  With the retirement of Dr. Aldean Ast, we were asked if we would step in to assist with the seed implantation and I did meet with Scott Rollins preoperatively.  He appeared to understand the advantages, disadvantages, potential risks, and complication, and presents now for the procedure.  The patient does have a number of medical core morbidities but has been assessed by his cardiologist.  The patient has had placement of PAS compression boots and has received perioperative  ciprofloxacin and presents now for the procedure.  TECHNIQUE AND FINDINGS:  The patient was brought to the operating room, where he had successful induction of general anesthesia.  He was placed in a standard lithotomy position, prepped and draped in the usual manner.  The patient's initial procedure was performed by radiation/oncology department under the guidance of Dr. Dayton Scrape. Transrectal ultrasound probe was placed, which was connected to an anchoring stand.  Prostate anchor needles were also placed.  Foley catheter with contrast in the balloon was placed.  Real time contouring of the urethra, rectum, and prostate was then performed by Dr. Dayton Scrape. Real time dosing was then established.  At the completion of this, we were contacted and came to the operating room.  A secondary time-out was performed.  Prostate needles were then placed with real time sagittal ultrasound guidance.  A total of 20 needles were placed.  After needle placement, the Nucletron robotic implanter was used to deliver the seeds.  A total of 62 seeds were implanted.  No obvious complications or problems occurred.  At the completion of the procedure, fluoroscopic imaging appeared to show good distribution of the seeds.  Foley catheter was removed and flexible cystoscopy was performed.  This revealed no obvious seeds within the prostatic urethra or bladder.  A new catheter was placed.  The  patient was brought to recovery room in stable condition having had no obvious complications or problems.     Valetta Fuller, MD     DSG/MEDQ  D:  02/26/2011  T:  02/27/2011  Job:  [id number]  Electronically Signed by Barron Alvine M.D. on 03/05/2011 08:40:45 AM

## 2011-03-13 ENCOUNTER — Ambulatory Visit (INDEPENDENT_AMBULATORY_CARE_PROVIDER_SITE_OTHER): Payer: 59 | Admitting: Family Medicine

## 2011-03-13 ENCOUNTER — Encounter: Payer: Self-pay | Admitting: Family Medicine

## 2011-03-13 DIAGNOSIS — E782 Mixed hyperlipidemia: Secondary | ICD-10-CM

## 2011-03-13 DIAGNOSIS — B354 Tinea corporis: Secondary | ICD-10-CM | POA: Insufficient documentation

## 2011-03-13 DIAGNOSIS — I10 Essential (primary) hypertension: Secondary | ICD-10-CM

## 2011-03-13 DIAGNOSIS — J438 Other emphysema: Secondary | ICD-10-CM

## 2011-03-13 MED ORDER — CITALOPRAM HYDROBROMIDE 40 MG PO TABS: 40.0000 mg | ORAL_TABLET | Freq: Every day | ORAL | Status: DC

## 2011-03-13 MED ORDER — TAMSULOSIN HCL 0.4 MG PO CAPS: 0.4000 mg | ORAL_CAPSULE | Freq: Every day | ORAL | Status: DC

## 2011-03-13 MED ORDER — KETOCONAZOLE 2 % EX CREA: TOPICAL_CREAM | CUTANEOUS | Status: DC

## 2011-03-13 MED ORDER — HYDROCHLOROTHIAZIDE 25 MG PO TABS: 25.0000 mg | ORAL_TABLET | Freq: Every day | ORAL | Status: DC

## 2011-03-13 MED ORDER — LOVASTATIN 20 MG PO TABS: 20.0000 mg | ORAL_TABLET | Freq: Two times a day (BID) | ORAL | Status: DC

## 2011-03-13 MED ORDER — CYCLOBENZAPRINE HCL 10 MG PO TABS: 10.0000 mg | ORAL_TABLET | Freq: Every day | ORAL | Status: DC

## 2011-03-13 MED ORDER — DOXAZOSIN MESYLATE 2 MG PO TABS: 2.0000 mg | ORAL_TABLET | Freq: Every day | ORAL | Status: DC

## 2011-03-13 MED ORDER — BISOPROLOL-HYDROCHLOROTHIAZIDE 5-6.25 MG PO TABS: 1.0000 | ORAL_TABLET | Freq: Every day | ORAL | Status: DC

## 2011-03-13 MED ORDER — LISINOPRIL 20 MG PO TABS: 20.0000 mg | ORAL_TABLET | Freq: Two times a day (BID) | ORAL | Status: DC

## 2011-03-13 NOTE — Assessment & Plan Note (Signed)
Problem stable.  Continue current medications and diet appropriate for this condition.  We have reviewed our general long term plan for this problem and also reviewed symptoms and signs that should prompt the patient to call or return to the office.  

## 2011-03-13 NOTE — Assessment & Plan Note (Signed)
Due for FLP, which he will get tomorrow morning. Continue statin and niacin.

## 2011-03-13 NOTE — Progress Notes (Signed)
OFFICE VISIT  03/13/2011   CC:  Chief Complaint  Patient presents with  . Medication Refill     HPI:    Patient is a 60 y.o. Caucasian male who presents for f/u HTN, hyperlipidemia, obesity, COPD. Dx'd with prostate cancer about 68mo ago, has been getting casodex, went through beam radiation, and recently got prostate seen implants 02/24/11. Feeling fatigued since being on casodex  but otherwise without complaint.   Trying to be physically active, has started walking some and plans on looking into water aerobics.   Due for lipid recheck.   F/u with Dr. Tenny Craw, cardiologist, recently was okay: no diagnostic testing warranted at this time per her report. Reports some chronic nasal congestion without facial pain, fever, ear pain, ST, or cough.  No SOB or chest tightness. No fever.  Also, has ongoing rash: itchy round lesions scattered over his back, a couple on arms, chest.  No response to topical steroids I rx'd 68mo ago.  It has improved 15-20% since taking some lamisil twice a day for the last 10d or so on and off. Has hx of athletes foot in the not too distant past, and the present rash came out not long after he finished treatment for his feet (which are now clear).  Past Medical History  Diagnosis Date  . OSA on CPAP     RestMed S6 machine, set at 4.6-6.4  . Osteoarthrosis, unspecified whether generalized or localized, unspecified site     DDD  . Hyperlipidemia   . Hypertension   . Coronary atherosclerosis of unspecified type of vessel, native or graft 2005    MI, s/p PCI with stent (pt reports 20+ interventions in the past, most recent cat 6/08 showed patent stents).  Normal LV function.  Nuclear stress test NEG 8/09.  Marland Kitchen COPD (chronic obstructive pulmonary disease)   . Depression     ?bipolar dx by psychiatrist?  . Anxiety   . 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 ED (erectile dysfunction)   . Obesities, morbid   . 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 Diverticulosis 2006  . Prostate cancer 09/2010    Localized, high risk disease (Dr. Aldean Ast)  and Dr. Isabel Caprice.  History reviewed. No pertinent past surgical history.  Outpatient Prescriptions Prior to Visit  Medication Sig Dispense Refill  . albuterol (PROAIR HFA) 108 (90 BASE) MCG/ACT inhaler Inhale 2 puffs into the lungs every 4 (four) hours as needed.        Marland Kitchen albuterol (PROVENTIL) (2.5 MG/3ML) 0.083% nebulizer solution Take 2.5 mg by nebulization every 4 (four) hours as needed.        Marland Kitchen aspirin 325 MG tablet Take 325 mg by mouth daily.        . furosemide (LASIX) 40 MG tablet Take 40 mg by mouth daily as needed.        . niacin 500 MG tablet Take 500 mg by mouth at bedtime.        . NON FORMULARY COPD STUDY DRUG       . Potassium 99 MG TABS Take 1 tablet by mouth daily.        . bisoprolol-hydrochlorothiazide (ZIAC) 5-6.25 MG per tablet Take 1 tablet by mouth daily.        . citalopram (CELEXA) 40 MG tablet Take 40 mg by mouth daily.        . cyclobenzaprine (FLEXERIL) 10 MG tablet Take 10  mg by mouth daily.        Marland Kitchen doxazosin (CARDURA) 2 MG tablet Take 2 mg by mouth at bedtime.        . hydrochlorothiazide 25 MG tablet Take 25 mg by mouth daily.        Marland Kitchen lisinopril (PRINIVIL,ZESTRIL) 20 MG tablet Take by mouth 2 (two) times daily.       Marland Kitchen lovastatin (MEVACOR) 20 MG tablet Take 20 mg by mouth 2 (two) times daily.        Lupron injection q4 mo Flomax 0.4mg  qhs Bicutamide (casodex) 1 tab daily  Allergies  Allergen Reactions  . Amitriptyline Hcl     REACTION: nausea, elevated bp  . Ezetimibe     REACTION: chest pain, fatigue    ROS As per HPI  PE: Blood pressure 145/86, pulse 77, temperature 97.8 F (36.6 C), temperature source Oral, weight 307 lb (139.254 kg), SpO2 95.00%. Gen: Alert, well appearing.  Patient is oriented to person, place, time, and situation. HEENT:   Ears: EACs clear, normal epithelium.  TMs with good light reflex and  landmarks bilaterally.  Eyes: no injection, icteris, swelling, or exudate.  EOMI, PERRLA. Nose: no drainage or turbinate edema/swelling.  No injection or focal lesion.  Mouth: lips without lesion/swelling.  Oral mucosa pink and moist.  Dentition intact and without obvious caries or gingival swelling.  Oropharynx without erythema, exudate, or swelling.  Chest: symmetric expansion, nonlabored respirations.  Clear and equal breath sounds in all lung fields.   CV: RRR, no m/r/g.  Peripheral pulses 2+ and symmetric. EXT: no clubbing, cyanosis, or edema.  SKIN: 10-12 oval pinkish macules with scaly texture, all measuring 1-3 cm in size, well demarcated borders.  No tenderness or erythema.  No papules or vesicles. Most are on his back, 1-2 on upper chest, 1-2 on each arm.  LABS:  none  IMPRESSION AND PLAN:  HYPERTENSION Problem stable.  Continue current medications and diet appropriate for this condition.  We have reviewed our general long term plan for this problem and also reviewed symptoms and signs that should prompt the patient to call or return to the office. Labs 02/18/11 showed normal CMET.  MIXED HYPERLIPIDEMIA Due for FLP, which he will get tomorrow morning. Continue statin and niacin.  EMPHYSEMA Problem stable.  Continue current medications and diet appropriate for this condition.  We have reviewed our general long term plan for this problem and also reviewed symptoms and signs that should prompt the patient to call or return to the office.   Tinea corporis Start ketoconazole 2% cream bid x 4 wks or so, extend 5d past clearance of rash.     FOLLOW UP: Return for tomorrow for fasting lipid panel (lab visit only).  F/u office visit 45mo.Marland Kitchen

## 2011-03-13 NOTE — Assessment & Plan Note (Signed)
Problem stable.  Continue current medications and diet appropriate for this condition.  We have reviewed our general long term plan for this problem and also reviewed symptoms and signs that should prompt the patient to call or return to the office. Labs 02/18/11 showed normal CMET.

## 2011-03-13 NOTE — Assessment & Plan Note (Signed)
Start ketoconazole 2% cream bid x 4 wks or so, extend 5d past clearance of rash.

## 2011-03-14 ENCOUNTER — Other Ambulatory Visit (INDEPENDENT_AMBULATORY_CARE_PROVIDER_SITE_OTHER): Payer: 59

## 2011-03-14 DIAGNOSIS — E785 Hyperlipidemia, unspecified: Secondary | ICD-10-CM

## 2011-03-14 LAB — LIPID PANEL
Cholesterol: 153 mg/dL (ref 0–200)
HDL: 34.2 mg/dL — ABNORMAL LOW (ref >39.00)
LDL Cholesterol: 84 mg/dL (ref 0–99)
Total CHOL/HDL Ratio: 4
Triglycerides: 173 mg/dL — ABNORMAL HIGH (ref 0.0–149.0)
VLDL: 34.6 mg/dL (ref 0.0–40.0)

## 2011-04-14 ENCOUNTER — Telehealth: Payer: Self-pay | Admitting: *Deleted

## 2011-04-14 ENCOUNTER — Other Ambulatory Visit: Payer: Self-pay | Admitting: Family Medicine

## 2011-04-14 DIAGNOSIS — R21 Rash and other nonspecific skin eruption: Secondary | ICD-10-CM

## 2011-04-14 NOTE — Telephone Encounter (Signed)
Referral order entered for [facility] (Dr. Pat Kocher

## 2011-04-14 NOTE — Telephone Encounter (Signed)
Pt would like referral to dermatology.  Pt states he called [facility] and can not get in until January.  Would like a different recommendation.

## 2011-04-15 ENCOUNTER — Encounter: Payer: Self-pay | Admitting: Family Medicine

## 2011-04-15 NOTE — Telephone Encounter (Signed)
Referral workqueue updated with pt preferences and pt will be notified with appt date/time.

## 2011-04-30 LAB — CBC
Hemoglobin: 15.1
MCHC: 33.4
MCV: 87.5
RBC: 5.18

## 2011-04-30 LAB — BASIC METABOLIC PANEL
CO2: 31
Calcium: 8.6
Chloride: 100
GFR calc Af Amer: >60
Potassium: 4.2
Sodium: 140

## 2011-04-30 LAB — PROTIME-INR: INR: 0.9

## 2011-04-30 LAB — CARDIAC PANEL(CRET KIN+CKTOT+MB+TROPI)
Total CK: 170
Troponin I: 0.02

## 2011-04-30 LAB — CK TOTAL AND CKMB (NOT AT ARMC): CK, MB: 6.7 — ABNORMAL HIGH

## 2011-04-30 LAB — POCT CARDIAC MARKERS
CKMB, poc: 5.7
Troponin i, poc: <0.05

## 2011-08-15 DIAGNOSIS — G40909 Epilepsy, unspecified, not intractable, without status epilepticus: Secondary | ICD-10-CM

## 2011-08-15 HISTORY — DX: Epilepsy, unspecified, not intractable, without status epilepticus: G40.909

## 2011-08-19 ENCOUNTER — Ambulatory Visit: Payer: Self-pay | Admitting: Family Medicine

## 2011-08-20 ENCOUNTER — Ambulatory Visit: Payer: Self-pay | Admitting: Family Medicine

## 2011-08-21 ENCOUNTER — Ambulatory Visit (INDEPENDENT_AMBULATORY_CARE_PROVIDER_SITE_OTHER): Payer: 59 | Admitting: Family Medicine

## 2011-08-21 ENCOUNTER — Encounter: Payer: Self-pay | Admitting: Family Medicine

## 2011-08-21 VITALS — BP 143/77 | HR 83 | Temp 98.3°F | Wt 323.0 lb

## 2011-08-21 DIAGNOSIS — M545 Low back pain: Secondary | ICD-10-CM

## 2011-08-21 DIAGNOSIS — Z23 Encounter for immunization: Secondary | ICD-10-CM

## 2011-08-21 MED ORDER — ZOSTER VACCINE LIVE 19400 UNT/0.65ML ~~LOC~~ SOLR: 0.6500 mL | Freq: Once | SUBCUTANEOUS | Status: AC

## 2011-08-21 MED ORDER — OXYCODONE-ACETAMINOPHEN 5-325 MG PO TABS: ORAL_TABLET | ORAL | Status: DC

## 2011-08-21 MED ORDER — CITALOPRAM HYDROBROMIDE 40 MG PO TABS: 40.0000 mg | ORAL_TABLET | Freq: Every day | ORAL | Status: DC

## 2011-08-21 MED ORDER — DOXAZOSIN MESYLATE 2 MG PO TABS: 2.0000 mg | ORAL_TABLET | Freq: Every day | ORAL | Status: DC

## 2011-08-21 MED ORDER — LOVASTATIN 20 MG PO TABS: 20.0000 mg | ORAL_TABLET | Freq: Two times a day (BID) | ORAL | Status: DC

## 2011-08-21 MED ORDER — CYCLOBENZAPRINE HCL 10 MG PO TABS: 10.0000 mg | ORAL_TABLET | Freq: Every day | ORAL | Status: DC

## 2011-08-21 MED ORDER — BISOPROLOL-HYDROCHLOROTHIAZIDE 5-6.25 MG PO TABS: 1.0000 | ORAL_TABLET | Freq: Every day | ORAL | Status: DC

## 2011-08-21 MED ORDER — LISINOPRIL 20 MG PO TABS: 20.0000 mg | ORAL_TABLET | Freq: Two times a day (BID) | ORAL | Status: DC

## 2011-08-21 MED ORDER — HYDROCHLOROTHIAZIDE 25 MG PO TABS: 25.0000 mg | ORAL_TABLET | Freq: Every day | ORAL | Status: DC

## 2011-08-21 NOTE — Progress Notes (Signed)
OFFICE NOTE  08/21/2011  CC:  Chief Complaint  Patient presents with  . Back Pain    x 2 weeks, getting worse     HPI: Patient is a 61 y.o. Caucasian male who is here for back pain. Onset about 1 wk ago w/out preceding incident or trauma, located exclusively on left mid lumber region, started out "sore" feeling but has progressed to having periods of intense sharp spasm-like pains.  No radiation, no paresthesias, no focal weakness, no loss of bowel/bladder control. Vicodin 5/500 minimal help. One of his wife's percocet 5/325 helped quite a bit. Lying on his side with legs curled up relieves things some.  Any movement exacerbates the pain.  Pertinent PMH:  Past Medical History  Diagnosis Date  . OSA on CPAP     RestMed S6 machine, set at 4.6-6.4  . Osteoarthrosis, unspecified whether generalized or localized, unspecified site     DDD  . Hyperlipidemia   . Hypertension   . Coronary atherosclerosis of unspecified type of vessel, native or graft 2005    MI, s/p PCI with stent (pt reports 20+ interventions in the past, most recent cat 6/08 showed patent stents).  Normal LV function.  Nuclear stress test NEG 8/09.  Marland Kitchen COPD (chronic obstructive pulmonary disease)   . Depression     ?bipolar dx by psychiatrist?  . Anxiety   . 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 ED (erectile dysfunction)   . Obesities, morbid   . 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 Diverticulosis 2006  . Prostate cancer 09/2010    Localized, high risk disease (Dr. Aldean Ast)   PSH: none.    MEDS:  Outpatient Prescriptions Prior to Visit  Medication Sig Dispense Refill  . albuterol (PROAIR HFA) 108 (90 BASE) MCG/ACT inhaler Inhale 2 puffs into the lungs every 4 (four) hours as needed.        Marland Kitchen albuterol (PROVENTIL) (2.5 MG/3ML) 0.083% nebulizer solution Take 2.5 mg by nebulization every 4 (four) hours as needed.        Marland Kitchen aspirin 325 MG tablet Take 325 mg by  mouth daily.        . furosemide (LASIX) 40 MG tablet Take 40 mg by mouth daily as needed.        Marland Kitchen Leuprolide Acetate (LUPRON IJ) Inject as directed every 4 (four) months.        . niacin 500 MG tablet Take 500 mg by mouth at bedtime.        . NON FORMULARY COPD STUDY DRUG       . Potassium 99 MG TABS Take 1 tablet by mouth daily.        . Tamsulosin HCl (FLOMAX) 0.4 MG CAPS Take 1 capsule (0.4 mg total) by mouth daily.  90 capsule  2  . bisoprolol-hydrochlorothiazide (ZIAC) 5-6.25 MG per tablet Take 1 tablet by mouth daily.  90 tablet  2  . citalopram (CELEXA) 40 MG tablet Take 1 tablet (40 mg total) by mouth daily.  90 tablet  2  . cyclobenzaprine (FLEXERIL) 10 MG tablet Take 1 tablet (10 mg total) by mouth daily.  90 tablet  2  . doxazosin (CARDURA) 2 MG tablet Take 1 tablet (2 mg total) by mouth at bedtime.  90 tablet  2  . hydrochlorothiazide 25 MG tablet Take 1 tablet (25 mg total) by mouth daily.  90 tablet  2  . lisinopril (PRINIVIL,ZESTRIL)  20 MG tablet Take 1 tablet (20 mg total) by mouth 2 (two) times daily.  180 tablet  2  . lovastatin (MEVACOR) 20 MG tablet Take 1 tablet (20 mg total) by mouth 2 (two) times daily.  90 tablet  2  . ketoconazole (NIZORAL) 2 % cream Apply to affected areas twice daily  75 g  5  . bicalutamide (CASODEX) 50 MG tablet Take 1 tablet by mouth daily.         PE: Blood pressure 143/77, pulse 83, temperature 98.3 F (36.8 C), temperature source Oral, weight 323 lb (146.512 kg). Gen: Alert, well appearing, morbidly obese WM who is occasionally wincing in pain as he moves.  Patient is oriented to person, place, time, and situation. ROM of L spine markedly limited due to pain.  Nontender to palpation in back in midline or paraspinous soft tissues.  No rash or bruising. LE strength 5/5 bilat, Patellar and achilles DTRs trace bilat.  SLR neg bilat.   IMPRESSION AND PLAN: Acute low back pain Musculoskeletal strain and muscle spasm. Percocet 5/325mg , 1-2  tabs po q6h prn.  Continue flexeril as he is already taking chronically for tension HAs. Refer to [facility] PT.  Will give 10d of samples of celebrex 200mg , 1 cap qd with food. F/u in office in 3 wks. Flu vaccine IM, pneumovax IM --given today. Zostavax rx given to patient today.      FOLLOW UP: 3 wks

## 2011-08-21 NOTE — Progress Notes (Signed)
Addended by: Luisa Dago on: 08/21/2011 03:40 PM   Modules accepted: Orders

## 2011-08-21 NOTE — Assessment & Plan Note (Signed)
Musculoskeletal strain and muscle spasm. Percocet 5/325mg , 1-2 tabs po q6h prn.  Continue flexeril as he is already taking chronically for tension HAs. Refer to [facility] PT.  Will give 10d of samples of celebrex 200mg , 1 cap qd with food. F/u in office in 3 wks. Flu vaccine IM, pneumovax IM --given today. Zostavax rx given to patient today.

## 2011-09-04 ENCOUNTER — Ambulatory Visit: Payer: 59 | Admitting: Family Medicine

## 2011-11-25 ENCOUNTER — Telehealth: Payer: Self-pay | Admitting: *Deleted

## 2011-11-25 MED ORDER — ZOLPIDEM TARTRATE 10 MG PO TABS: 10.0000 mg | ORAL_TABLET | Freq: Every evening | ORAL | Status: DC | PRN

## 2011-11-25 NOTE — Telephone Encounter (Signed)
Zolpidem 10 mg tab, 1/2 to 1 tab po qhs prn insomnia, disp #15, no rf

## 2011-11-25 NOTE — Telephone Encounter (Signed)
Pt notified and RX called to [facility] @ [facility] in [place].

## 2011-11-25 NOTE — Telephone Encounter (Signed)
Pt states that he is having trouble sleeping at night and would like RX for Ambien to be called in.  Pt is out of town and will schedule appt to discuss multiple issues with Dr. Milinda Cave when he comes back after [facility].  Pt states with COPD and now prostate cancer he is up and down all night.  Pt has taken 10 mg tablet before, but we have never prescribed.  Last filled by [facility].  Pt admits to taking two of his mother's and has been able to sleep better the last two nights.  Advised Dr. Milinda Cave out of office this PM, but I would send note to Dr. Abner Greenspan and he is OK with that. Please advise RX request.

## 2012-03-10 ENCOUNTER — Other Ambulatory Visit: Payer: Self-pay | Admitting: Family Medicine

## 2012-03-10 NOTE — Telephone Encounter (Signed)
Caller: Dearion/Patient; Patient Name: Scott Rollins; PCP: [facility]); Best Callback Phone Number: [phone].  Called to verify if office handled refill request 03/10/12 from [facility] for Lisinopril and Lovestatin.  Planning to resume Lovestatin after 3-4 weeks off of it.  Requests Lovestatin 20 mg #180; sig 1 po BID (for 90 days).  Last office visit 08/21/11;  Plans to schedule appointment for September 2013. Leaving at 1900 03/10/12 for [facility].  Information noted and sent to office for requests refill of medication with valid refills per Medication Question guideline.  Stated he thinks pharmacy had refills for Lisinopril but needs it for Lovestatin. OFFICE Please follow up 03/10/12.

## 2012-03-11 NOTE — Telephone Encounter (Signed)
RX sent

## 2012-04-27 ENCOUNTER — Encounter: Payer: Self-pay | Admitting: Family Medicine

## 2012-04-29 ENCOUNTER — Ambulatory Visit (INDEPENDENT_AMBULATORY_CARE_PROVIDER_SITE_OTHER): Payer: 59 | Admitting: Family Medicine

## 2012-04-29 ENCOUNTER — Encounter: Payer: Self-pay | Admitting: Family Medicine

## 2012-04-29 VITALS — BP 122/69 | HR 73 | Ht 70.5 in | Wt 312.0 lb

## 2012-04-29 DIAGNOSIS — Z0000 Encounter for general adult medical examination without abnormal findings: Secondary | ICD-10-CM

## 2012-04-29 DIAGNOSIS — I10 Essential (primary) hypertension: Secondary | ICD-10-CM

## 2012-04-29 DIAGNOSIS — Z7189 Other specified counseling: Secondary | ICD-10-CM | POA: Insufficient documentation

## 2012-04-29 DIAGNOSIS — E785 Hyperlipidemia, unspecified: Secondary | ICD-10-CM

## 2012-04-29 DIAGNOSIS — Z23 Encounter for immunization: Secondary | ICD-10-CM

## 2012-04-29 LAB — CBC WITH DIFFERENTIAL/PLATELET
Basophils Absolute: 0 10*3/uL (ref 0.0–0.1)
HCT: 44.5 % (ref 39.0–52.0)
Lymphocytes Relative: 21.3 % (ref 12.0–46.0)
Lymphs Abs: 1.8 10*3/uL (ref 0.7–4.0)
Monocytes Relative: 5.5 % (ref 3.0–12.0)
Platelets: 287 10*3/uL (ref 150.0–400.0)
RDW: 13.7 % (ref 11.5–14.6)

## 2012-04-29 LAB — LIPID PANEL
HDL: 24.6 mg/dL — ABNORMAL LOW (ref >39.00)
LDL Cholesterol: 128 mg/dL — ABNORMAL HIGH (ref 0–99)
Total CHOL/HDL Ratio: 8
VLDL: 32.2 mg/dL (ref 0.0–40.0)

## 2012-04-29 LAB — COMPREHENSIVE METABOLIC PANEL
ALT: 14 U/L (ref 0–53)
AST: 15 U/L (ref 0–37)
Calcium: 8.6 mg/dL (ref 8.4–10.5)
Chloride: 101 mEq/L (ref 96–112)
Creatinine, Ser: 1.3 mg/dL (ref 0.4–1.5)

## 2012-04-29 LAB — TSH: TSH: 1.38 u[IU]/mL (ref 0.35–5.50)

## 2012-04-29 MED ORDER — CYCLOBENZAPRINE HCL 10 MG PO TABS: 10.0000 mg | ORAL_TABLET | Freq: Every day | ORAL | Status: DC

## 2012-04-29 MED ORDER — ZOLPIDEM TARTRATE 10 MG PO TABS: 10.0000 mg | ORAL_TABLET | Freq: Every evening | ORAL | Status: DC | PRN

## 2012-04-29 MED ORDER — HYDROCHLOROTHIAZIDE 25 MG PO TABS: 25.0000 mg | ORAL_TABLET | Freq: Every day | ORAL | Status: DC

## 2012-04-29 MED ORDER — CITALOPRAM HYDROBROMIDE 40 MG PO TABS: 40.0000 mg | ORAL_TABLET | Freq: Every day | ORAL | Status: DC

## 2012-04-29 MED ORDER — LISINOPRIL 20 MG PO TABS: 20.0000 mg | ORAL_TABLET | Freq: Two times a day (BID) | ORAL | Status: DC

## 2012-04-29 MED ORDER — ZOSTER VACCINE LIVE 19400 UNT/0.65ML ~~LOC~~ SOLR: 0.6500 mL | Freq: Once | SUBCUTANEOUS | Status: DC

## 2012-04-29 MED ORDER — BISOPROLOL-HYDROCHLOROTHIAZIDE 5-6.25 MG PO TABS: 1.0000 | ORAL_TABLET | Freq: Every day | ORAL | Status: DC

## 2012-04-29 MED ORDER — DOXAZOSIN MESYLATE 2 MG PO TABS: 2.0000 mg | ORAL_TABLET | Freq: Every day | ORAL | Status: DC

## 2012-04-29 NOTE — Assessment & Plan Note (Signed)
Reviewed age and gender appropriate health maintenance issues (prudent diet, regular exercise, health risks of tobacco and excessive alcohol, use of seatbelts, fire alarms in home, use of sunscreen).  Also reviewed age and gender appropriate health screening as well as vaccine recommendations. Flu vaccine IM today.  Zostavax rx given today. Routine labs: CBC, CMET, TSH, FLP. His PSAs are followed by his urologist due to his dx of prostate cancer--still in treatment phase. I do wonder if his "deja vu" episodes he has been having for >15 yrs is epilepsy since it seems to have resolved/abated on neurontin 300mg  bid. Of note, a brain MRI done by neurologist in 2007 for c/o HA's was unremarkable.  I don't think further imaging or EEG are necessary at this time. RF'd all meds today except his statin--we'll wait for FLP results before RFing this one.

## 2012-04-29 NOTE — Progress Notes (Signed)
Office Note 04/29/2012  CC:  Chief Complaint  Patient presents with  . Annual Exam    needs refills    HPI:  Scott Rollins is a 61 y.o. White male who is here for CPE. Has been out of zocor for 6 wks or so. Says he's been feeling ok except for some arthritis pain--he asks for celebrex trial.  He says he is aware of the increased risk of myocardial infarction/events on this NSAID for people with known CAD, but says he would take that chance if he got a lot of benefit out of the med.  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  Has been on gabapentin lately since being on leupron (leupron caused hot flashes) and notices that he has not had any of these episodes since being on gabapentin.  Also, his hot flashes are gone.  Of note, the patient has not been taking niaspan, but has been taking two 500mg  OTC niacin tabs instead.  Past Medical History  Diagnosis Date  . OSA on CPAP     RestMed S6 machine, set at 4.6-6.4  . Osteoarthrosis, unspecified whether generalized or localized, unspecified site     DDD  . Hyperlipidemia   . Hypertension   . Coronary atherosclerosis of unspecified type of vessel, native or graft 2005    MI, s/p PCI with stent (pt reports 20+ interventions in the past, most recent cat 6/08 showed patent stents).  Normal LV function.  Nuclear stress test NEG 8/09.  Marland Kitchen COPD (chronic obstructive pulmonary disease)     on "study med"  . Depression     ?bipolar dx by psychiatrist?  . Anxiety   . 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 ED (erectile dysfunction)   . Obesities, morbid   . 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 Diverticulosis 2006  . Prostate cancer 09/2010    Localized, high risk disease (Dr. Aldean Ast)    No past surgical history on file.  Family History  Problem Relation Age of Onset  .  Arthritis Mother   . Heart disease Father   . Bipolar disorder Father     History   Social History  . Marital Status: Married    Spouse Name: [name]    Number of Children: N/A  . Years of Education: N/A   Occupational History  . [employer]    Social History Main Topics  . Smoking status: Former Smoker -- 0.5 packs/day    Types: Cigarettes    Quit date: 07/18/2010  . Smokeless tobacco: Never Used   Comment: 2ppd for 46 years, currently .5 ppd  . Alcohol Use: No  . Drug Use: No  . Sexually Active: Not on file   Other Topics Concern  . Not on file   Social History Narrative   ** Merged History Encounter **     Outpatient Prescriptions Prior to Visit  Medication Sig Dispense Refill  . albuterol (PROAIR HFA) 108 (90 BASE) MCG/ACT inhaler Inhale 2 puffs into the lungs every 4 (four) hours as needed.        Marland Kitchen albuterol (PROVENTIL) (2.5 MG/3ML) 0.083% nebulizer solution Take 2.5 mg by nebulization every 4 (four) hours as needed.        Marland Kitchen aspirin 325 MG tablet Take 325 mg by mouth daily.        Marland Kitchen  furosemide (LASIX) 40 MG tablet Take 40 mg by mouth daily as needed.        Marland Kitchen ketoconazole (NIZORAL) 2 % cream Apply to affected areas twice daily  75 g  5  . lovastatin (MEVACOR) 20 MG tablet TAKE ONE TABLET BY MOUTH TWICE DAILY  180 tablet  0  . niacin 500 MG tablet Take 500 mg by mouth 2 (two) times daily with a meal.       . NON FORMULARY COPD STUDY DRUG       . oxyCODONE-acetaminophen (ROXICET) 5-325 MG per tablet 1-2 tabs po q6h prn pain  60 tablet  0  . Potassium 99 MG TABS Take 1 tablet by mouth daily.        . bisoprolol-hydrochlorothiazide (ZIAC) 5-6.25 MG per tablet Take 1 tablet by mouth daily.  90 tablet  2  . citalopram (CELEXA) 40 MG tablet Take 1 tablet (40 mg total) by mouth daily.  90 tablet  2  . cyclobenzaprine (FLEXERIL) 10 MG tablet Take 1 tablet (10 mg total) by mouth daily.  90 tablet  2  . doxazosin (CARDURA) 2 MG tablet Take 1 tablet (2 mg total) by mouth at bedtime.   90 tablet  2  . hydrochlorothiazide (HYDRODIURIL) 25 MG tablet Take 1 tablet (25 mg total) by mouth daily.  90 tablet  2  . lisinopril (PRINIVIL,ZESTRIL) 20 MG tablet Take 1 tablet (20 mg total) by mouth 2 (two) times daily.  180 tablet  2  . zolpidem (AMBIEN) 10 MG tablet Take 1 tablet (10 mg total) by mouth at bedtime as needed for sleep.  15 tablet  0  . Tamsulosin HCl (FLOMAX) 0.4 MG CAPS Take 1 capsule (0.4 mg total) by mouth daily.  90 capsule  2  . Leuprolide Acetate (LUPRON IJ) Inject as directed every 4 (four) months.        Gabapentin 300mg  bid--for hot flashes since being on hormone deprivation therapy for prostate cancer.  Allergies  Allergen Reactions  . Amitriptyline Hcl     REACTION: nausea, elevated bp  . Ezetimibe     REACTION: chest pain, fatigue    ROS Review of Systems  Constitutional: Negative for fever, chills, appetite change and fatigue.  HENT: Negative for ear pain, congestion, sore throat, neck stiffness and dental problem.   Eyes: Negative for discharge, redness and visual disturbance.  Respiratory: Positive for shortness of breath (chronic; COPD-associated). Negative for cough, chest tightness and wheezing.   Cardiovascular: Negative for chest pain, palpitations and leg swelling.  Gastrointestinal: Negative for nausea, vomiting, abdominal pain, diarrhea and blood in stool.  Genitourinary: Negative for dysuria, frequency, hematuria, flank pain and difficulty urinating. Urgency: + leakage--chronic.  Musculoskeletal: Positive for arthralgias (knees and low back most commonly). Negative for myalgias, back pain and joint swelling.  Skin: Positive for color change (hypopigmented macules on trunk primarily, c/w vitiligo). Negative for pallor and rash.  Neurological: Positive for headaches (helped a lot by flexeril). Negative for dizziness, speech difficulty and weakness.  Hematological: Negative for adenopathy. Does not bruise/bleed easily.  Psychiatric/Behavioral:  Positive for disturbed wake/sleep cycle (on and off--responds to prn use of ambien). Negative for confusion. The patient is not nervous/anxious.     PE; Blood pressure 122/69, pulse 73, height 5' 10.5" (1.791 m), weight 312 lb (141.522 kg). Gen: Alert, well appearing, morbidly obese white male.  Patient is oriented to person, place, time, and situation. AFFECT: pleasant, lucid thought and speech. ENT: Ears: EACs clear, normal epithelium.  TMs with good light reflex and landmarks bilaterally.  Eyes: no injection, icteris, swelling, or exudate.  EOMI, PERRLA. Nose: no drainage or turbinate edema/swelling.  No injection or focal lesion.  Mouth: lips without lesion/swelling.  Oral mucosa pink and moist.  Dentition intact, with some discoloration scattered about teeth, no gingival swelling.  Oropharynx without erythema, exudate, or swelling.  Neck: supple/nontender.  No LAD, mass, or TM.  Carotid pulses 2+ bilaterally, without bruits. CV: RRR, no m/r/g LUNGS: CTA bilat, diminished BS throughout all lung fields, mildly prolonged exp phase.  Nonlabored resps. ABD: soft, extremely rotund, NT, ND, BS normal.  No hepatospenomegaly or mass.  No bruits. EXT: no clubbing or cyanosis.  He has 2+ pitting edema in both lower legs, anterior tibial bronzing and flakiness to skin.  No skin breakdown.  DP/PT pulses 1+ bilat. Genitals normal; both testes mildly atrophied without tenderness, masses, hydroceles, varicoceles, erythema or swelling. Shaft normal, circumcised, meatus normal without discharge. No inguinal hernia noted. No inguinal lymphadenopathy.  Pertinent labs:  None today  ASSESSMENT AND PLAN:   Health maintenance examination Reviewed age and gender appropriate health maintenance issues (prudent diet, regular exercise, health risks of tobacco and excessive alcohol, use of seatbelts, fire alarms in home, use of sunscreen).  Also reviewed age and gender appropriate health screening as well as vaccine  recommendations. Flu vaccine IM today.  Zostavax rx given today. Routine labs: CBC, CMET, TSH, FLP. His PSAs are followed by his urologist due to his dx of prostate cancer--still in treatment phase. I do wonder if his "deja vu" episodes he has been having for >15 yrs is epilepsy since it seems to have resolved/abated on neurontin 300mg  bid. Of note, a brain MRI done by neurologist in 2007 for c/o HA's was unremarkable.  I don't think further imaging or EEG are necessary at this time. RF'd all meds today except his statin--we'll wait for FLP results before RFing this one.   Celebrex samples given for trial today.  Therapeutic expectations and side effect profile of medication discussed today.  Patient's questions answered.  Pt expressed clear understanding of potential CV risk of this med and says he wants to give it a trial period.   FOLLOW UP:  Return in about 6 months (around 10/28/2012) for f/u HTN, hyperlip, obesity, COPD.

## 2012-05-03 ENCOUNTER — Other Ambulatory Visit: Payer: Self-pay | Admitting: Family Medicine

## 2012-05-03 MED ORDER — NIACIN ER (ANTIHYPERLIPIDEMIC) 1000 MG PO TBCR: 1000.0000 mg | EXTENDED_RELEASE_TABLET | Freq: Every day | ORAL | Status: DC

## 2012-07-22 ENCOUNTER — Telehealth: Payer: Self-pay | Admitting: Internal Medicine

## 2012-07-22 ENCOUNTER — Ambulatory Visit (INDEPENDENT_AMBULATORY_CARE_PROVIDER_SITE_OTHER)
Admission: RE | Admit: 2012-07-22 | Discharge: 2012-07-22 | Disposition: A | Discharge status: Home / Self Care | Payer: 59 | Source: Ambulatory Visit | Attending: Family Medicine | Admitting: Family Medicine

## 2012-07-22 ENCOUNTER — Ambulatory Visit (INDEPENDENT_AMBULATORY_CARE_PROVIDER_SITE_OTHER): Payer: 59 | Admitting: Family Medicine

## 2012-07-22 ENCOUNTER — Encounter: Payer: Self-pay | Admitting: Family Medicine

## 2012-07-22 ENCOUNTER — Telehealth: Payer: Self-pay | Admitting: Family Medicine

## 2012-07-22 VITALS — BP 237/121 | HR 71 | Temp 98.3°F | Ht 70.5 in | Wt 316.0 lb

## 2012-07-22 DIAGNOSIS — I10 Essential (primary) hypertension: Secondary | ICD-10-CM

## 2012-07-22 DIAGNOSIS — R0989 Other specified symptoms and signs involving the circulatory and respiratory systems: Secondary | ICD-10-CM

## 2012-07-22 DIAGNOSIS — R0600 Dyspnea, unspecified: Secondary | ICD-10-CM

## 2012-07-22 DIAGNOSIS — R0602 Shortness of breath: Secondary | ICD-10-CM

## 2012-07-22 MED ORDER — HYDROCHLOROTHIAZIDE 25 MG PO TABS: 25.0000 mg | ORAL_TABLET | Freq: Every day | ORAL | Status: DC

## 2012-07-22 MED ORDER — OXYCODONE-ACETAMINOPHEN 5-325 MG PO TABS: ORAL_TABLET | ORAL | Status: DC

## 2012-07-22 MED ORDER — DOXAZOSIN MESYLATE 2 MG PO TABS: 2.0000 mg | ORAL_TABLET | Freq: Every day | ORAL | Status: DC

## 2012-07-22 NOTE — Assessment & Plan Note (Addendum)
Combo of med noncompliance + recent NSAID use for diffuse chronic musculoskeletal pains. Restart HCTZ 25mg  today, and restart cardura 2mg  in 3d.  Stop all NSAIDs.  Roxicet 5/325, 1-2 q6h prn, #60, no RF--rx'd today. Continue to monitor bp bid at home and return in 1 week for review of these. If he develops severe HA, focal weakness, vision complaints, chest pain, or worsened SOB then he'll proceed to ED or call 911. Will check BMET today. Check CXR here in office today to eval for pulm edema that may be contributing to his SOB.

## 2012-07-22 NOTE — Telephone Encounter (Signed)
Patient Information:  Caller Name: Emersyn  Phone: [phone]  Patient: , Scott Rollins  Gender: Male  DOB: 22-Nov-1950  Age: 62 Years  PCP: Earley Favor [facility])  Office Follow Up:  Does the office need to follow up with this patient?: No  Instructions For The Office: N/A   Symptoms  Reason For Call & Symptoms: Patient states he is in a COPD study.  He goes in every three months for PFT study with [facility]-Dr.  Francesca Oman.   He went today to his study and he told the nurse that he had been having short of breath since Thanksgiving worsening, especially with exertion , elevated B/P and night sweats for over a month. Ongoing x1 month.  (Hx of 23 catheriazation and stents).  He saw the Nurse and she insisted that he be seen .  He has been out of his Cardura for three weeks. Hx of Chronic Bronchitis.  Blood pressure 124/72  and was 192/75 today.  Recent car travel for four hours yesterday. Denies heart or chest pain. Voice clear.   Reviewed Health History In EMR: Yes  Reviewed Medications In EMR: Yes  Reviewed Allergies In EMR: Yes  Reviewed Surgeries / Procedures: No  Date of Onset of Symptoms: 06/24/2012  Treatments Tried: Treated himself with Amoxicillen (left over medication in December for cough).  Treatments Tried Worked: No  Guideline(s) Used:  Breathing Difficulty  Disposition Per Guideline:   Go to Office Now  Reason For Disposition Reached:   Longstanding difficulty breathing (e.g., CHF, COPD, emphysema) and worse than normal  Advice Given:  General Care Advice for Breathing Difficulty:  Find position of greatest comfort. For most patients the best position is semi-upright (e.g., sitting up in a comfortable chair or lying back against pillows).  Elevate head of bed (e.g., use pillows or place blocks under bed).  Avoid smoke or fume exposure.  Create a draft (e.g., use a fan directed at the face, or open a window).  Keep room temperature slightly on the  cool side.  Limit activities or space activities apart during the day. Prioritize activities.  Use a humidifier.  Call Back If:  Severe difficulty breathing occurs  Fever more than 100.5 F (38.1 C)  You become worse.  Appointment Scheduled:  07/22/2012 15:45:00 Appointment Scheduled Provider:  Earley Favor [facility])

## 2012-07-22 NOTE — Telephone Encounter (Signed)
Left message for call back. Patient was seen by Dr.[name] today.

## 2012-07-22 NOTE — Telephone Encounter (Signed)
Pt has appt today

## 2012-07-22 NOTE — Patient Instructions (Signed)
Stop alleve and ibuprofen. Stop aspirin for now.  We'll restart this med once your bp is well controlled again.

## 2012-07-22 NOTE — Progress Notes (Signed)
OFFICE NOTE  07/22/2012  CC:  Chief Complaint  Patient presents with  . Hypertension    out of meds-cardura; BP up; shortness of breath     HPI: Patient is a 62 y.o. Caucasian male who is here for elevated blood pressure and shortness of breath. I last saw for routine f/u about 2-3 mo ago and he had normal bp and was compliant with his meds.   He has been out of his cardura and HCTZ x 1 mo.  Money issues and life circumstances have made it hard to stay compliant with these meds.  He does not check bp at home.  His bp was noted to be high today at his pulmonologist (sees Dr. Dayton Scrape for research study drug for COPD), was seen by the NP there, was told to see either me or his cardiologist today.  He feels well except for having a mild increase in his SOB over the last month.  No coughing or wheezing.  No CP, no HA, no vision c/o, no dizziness, no increase in LE edema, no focal or generalized weakness.  He has been taking ibuprofen 600mg  qAM and 440 mg aleve qhs for chronic diffuse musculoskeletal pain that has been worsening for the last year.  He is out of his Roxicet.   He has not worn his CPAP in over a year.  He has not been taking lasix in "weeks" b/c he detected no increase in his LE edema.    He has his next urology f/u 08/05/12 for f/u of prostate cancer.  PSAs have been ok on f/u's there.   Pertinent PMH:  Past Medical History  Diagnosis Date  . OSA on CPAP     RestMed S6 machine, set at 4.6-6.4  . Osteoarthrosis, unspecified whether generalized or localized, unspecified site     DDD  . Hyperlipidemia   . Hypertension   . Coronary atherosclerosis of unspecified type of vessel, native or graft 2005    MI, s/p PCI with stent (pt reports 20+ interventions in the past, most recent cat 6/08 showed patent stents).  Normal LV function.  Nuclear stress test NEG 8/09.  Marland Kitchen COPD (chronic obstructive pulmonary disease)     on "study med"  . Depression     ?bipolar dx by psychiatrist?  .  Anxiety   . 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 ED (erectile dysfunction)   . Obesities, morbid   . 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 Diverticulosis 2006  . Prostate cancer 09/2010    Localized, high risk disease (Dr. Aldean Ast)    MEDS:  Outpatient Prescriptions Prior to Visit  Medication Sig Dispense Refill  . albuterol (PROAIR HFA) 108 (90 BASE) MCG/ACT inhaler Inhale 2 puffs into the lungs every 4 (four) hours as needed.        Marland Kitchen albuterol (PROVENTIL) (2.5 MG/3ML) 0.083% nebulizer solution Take 2.5 mg by nebulization every 4 (four) hours as needed.        Marland Kitchen aspirin 325 MG tablet Take 325 mg by mouth daily.        . bisoprolol-hydrochlorothiazide (ZIAC) 5-6.25 MG per tablet Take 1 tablet by mouth daily.  90 tablet  2  . citalopram (CELEXA) 40 MG tablet Take 1 tablet (40 mg total) by mouth daily.  90 tablet  2  . gabapentin (NEURONTIN) 300 MG capsule Take 300 mg by mouth Twice daily. As per hem/onc      .  ketoconazole (NIZORAL) 2 % cream Apply to affected areas twice daily  75 g  5  . lisinopril (PRINIVIL,ZESTRIL) 20 MG tablet Take 1 tablet (20 mg total) by mouth 2 (two) times daily.  180 tablet  2  . lovastatin (MEVACOR) 20 MG tablet TAKE ONE TABLET BY MOUTH TWICE DAILY  180 tablet  0  . NON FORMULARY COPD STUDY DRUG       . Potassium 99 MG TABS Take 1 tablet by mouth daily.        . cyclobenzaprine (FLEXERIL) 10 MG tablet Take 1 tablet (10 mg total) by mouth daily.  90 tablet  2  . furosemide (LASIX) 40 MG tablet Take 40 mg by mouth daily as needed.        . niacin (NIASPAN) 1000 MG CR tablet Take 1 tablet (1,000 mg total) by mouth at bedtime.  30 tablet  5  . niacin 500 MG tablet Take 500 mg by mouth 2 (two) times daily with a meal.       . Tamsulosin HCl (FLOMAX) 0.4 MG CAPS Take 1 capsule (0.4 mg total) by mouth daily.  90 capsule  2  . zolpidem (AMBIEN) 10 MG tablet Take 1 tablet (10 mg total) by mouth at bedtime as  needed for sleep.  30 tablet  0  . zoster vaccine live, PF, (ZOSTAVAX) 16109 UNT/0.65ML injection Inject 19,400 Units into the skin once.  1 vial  0  . [DISCONTINUED] doxazosin (CARDURA) 2 MG tablet Take 1 tablet (2 mg total) by mouth at bedtime.  90 tablet  2  . [DISCONTINUED] hydrochlorothiazide (HYDRODIURIL) 25 MG tablet Take 1 tablet (25 mg total) by mouth daily.  90 tablet  2  . [DISCONTINUED] oxyCODONE-acetaminophen (ROXICET) 5-325 MG per tablet 1-2 tabs po q6h prn pain  60 tablet  0  Last reviewed on 07/22/2012  4:32 PM by Jeoffrey Massed, MD  PE: Blood pressure 237/121, pulse 71, temperature 98.3 F (36.8 C), temperature source Oral, height 5' 10.5" (1.791 m), weight 316 lb (143.337 kg), SpO2 89.00%.  Wt up 4 lb compared to o/v here 04/2012. Gen: Alert, well appearing.  Patient is oriented to person, place, time, and situation. AFFECT: pleasant, lucid thought and speech. CV: RRR, no m/r/g.   LUNGS: CTA bilat, nonlabored resps, good aeration in all lung fields. ABD: rotund, soft, nontender EXT: no significant edema, no clubbing or cyanosis   IMPRESSION AND PLAN:  Severe uncontrolled hypertension Combo of med noncompliance + recent NSAID use for diffuse chronic musculoskeletal pains. Restart HCTZ 25mg  today, and restart cardura 2mg  in 3d.  Stop all NSAIDs.  Roxicet 5/325, 1-2 q6h prn, #60, no RF--rx'd today. Continue to monitor bp bid at home and return in 1 week for review of these. If he develops severe HA, focal weakness, vision complaints, chest pain, or worsened SOB then he'll proceed to ED or call 911. Will check BMET today. Check CXR here in office today to eval for pulm edema that may be contributing to his SOB.   An After Visit Summary was printed and given to the patient.  FOLLOW UP: 1 wk

## 2012-07-22 NOTE — Telephone Encounter (Signed)
Pt is in a copd program with [facility] and Dr. Larene Beach saw him and told him he needed to be seen before the weekend due to elevated b/p and SOB. Lorin Picket is booked.

## 2012-07-23 LAB — BASIC METABOLIC PANEL
CO2: 32 mEq/L (ref 19–32)
Calcium: 8.7 mg/dL (ref 8.4–10.5)
Sodium: 139 mEq/L (ref 135–145)

## 2012-07-30 ENCOUNTER — Telehealth: Payer: Self-pay | Admitting: Internal Medicine

## 2012-07-30 ENCOUNTER — Ambulatory Visit (INDEPENDENT_AMBULATORY_CARE_PROVIDER_SITE_OTHER): Payer: 59 | Admitting: Family Medicine

## 2012-07-30 ENCOUNTER — Encounter: Payer: Self-pay | Admitting: Family Medicine

## 2012-07-30 VITALS — BP 117/78 | HR 71 | Ht 70.5 in | Wt 313.0 lb

## 2012-07-30 DIAGNOSIS — E785 Hyperlipidemia, unspecified: Secondary | ICD-10-CM

## 2012-07-30 DIAGNOSIS — Z0000 Encounter for general adult medical examination without abnormal findings: Secondary | ICD-10-CM

## 2012-07-30 DIAGNOSIS — I10 Essential (primary) hypertension: Secondary | ICD-10-CM

## 2012-07-30 DIAGNOSIS — M1990 Unspecified osteoarthritis, unspecified site: Secondary | ICD-10-CM

## 2012-07-30 MED ORDER — ZOLPIDEM TARTRATE 10 MG PO TABS: 10.0000 mg | ORAL_TABLET | Freq: Every evening | ORAL | Status: DC | PRN

## 2012-07-30 MED ORDER — GABAPENTIN 300 MG PO CAPS: 300.0000 mg | ORAL_CAPSULE | Freq: Three times a day (TID) | ORAL | Status: DC

## 2012-07-30 MED ORDER — CELECOXIB 200 MG PO CAPS: 200.0000 mg | ORAL_CAPSULE | Freq: Every day | ORAL | Status: DC

## 2012-07-30 NOTE — Progress Notes (Signed)
OFFICE NOTE  07/30/2012  CC:  Chief Complaint  Patient presents with  . Follow-up    BP-better; pt has home readings     HPI: Patient is a 62 y.o. Caucasian male who is here for 8 day f/u for uncontrolled HTN.  He restarted the 2 bp meds that he had not been taking and his bps have improved GREATLY.  He feels much improved, no more SOB.  He does lament about chronic osteoarthritic pain, primarily left shoulder, both hips, both knees--says celebrex 200mg  qd helps 100%.  He wants to avoid sedating and potentially addictive narcotic pain meds (he states he hasn't filled the recent percocet rx).  Of note, he has been on neurontin 300mg  tid via his urologist ever since prostate cancer treatment (helps "hot flashes" per pt).  He notes that ever since he has been on this med, he has had resolution of all of his "deja vu" type episodes that we had thought were likely seizure acitivity.  He has not seen a neurologist for eval of this.  Pertinent PMH:  Past Medical History  Diagnosis Date  . OSA on CPAP     RestMed S6 machine, set at 4.6-6.4  . Osteoarthrosis, unspecified whether generalized or localized, unspecified site     DDD  . Hyperlipidemia   . Hypertension   . Coronary atherosclerosis of unspecified type of vessel, native or graft 2005    MI, s/p PCI with stent (pt reports 20+ interventions in the past, most recent cat 6/08 showed patent stents).  Normal LV function.  Nuclear stress test NEG 8/09.  Marland Kitchen COPD (chronic obstructive pulmonary disease)     on "study med"  . Depression     ?bipolar dx by psychiatrist?  . Anxiety   . 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 ED (erectile dysfunction)   . Obesities, morbid   . 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 Diverticulosis 2006  . Prostate cancer 09/2010    Localized, high risk disease (Dr. Aldean Ast)    MEDS:  Outpatient Prescriptions Prior to Visit  Medication Sig Dispense  Refill  . albuterol (PROAIR HFA) 108 (90 BASE) MCG/ACT inhaler Inhale 2 puffs into the lungs every 4 (four) hours as needed.        Marland Kitchen albuterol (PROVENTIL) (2.5 MG/3ML) 0.083% nebulizer solution Take 2.5 mg by nebulization every 4 (four) hours as needed.        Marland Kitchen aspirin 325 MG tablet Take 325 mg by mouth daily.        . bisoprolol-hydrochlorothiazide (ZIAC) 5-6.25 MG per tablet Take 1 tablet by mouth daily.  90 tablet  2  . citalopram (CELEXA) 40 MG tablet Take 1 tablet (40 mg total) by mouth daily.  90 tablet  2  . cyclobenzaprine (FLEXERIL) 10 MG tablet Take 1 tablet (10 mg total) by mouth daily.  90 tablet  2  . doxazosin (CARDURA) 2 MG tablet Take 1 tablet (2 mg total) by mouth at bedtime.  30 tablet  6  . furosemide (LASIX) 40 MG tablet Take 40 mg by mouth daily as needed.        . gabapentin (NEURONTIN) 300 MG capsule Take 300 mg by mouth 3 (three) times daily. As per hem/onc      . hydrochlorothiazide (HYDRODIURIL) 25 MG tablet Take 1 tablet (25 mg total) by mouth daily.  30 tablet  6  . ketoconazole (NIZORAL) 2 % cream  Apply to affected areas twice daily  75 g  5  . lisinopril (PRINIVIL,ZESTRIL) 20 MG tablet Take 1 tablet (20 mg total) by mouth 2 (two) times daily.  180 tablet  2  . lovastatin (MEVACOR) 20 MG tablet TAKE ONE TABLET BY MOUTH TWICE DAILY  180 tablet  0  . niacin (NIASPAN) 1000 MG CR tablet Take 1 tablet (1,000 mg total) by mouth at bedtime.  30 tablet  5  . niacin 500 MG tablet Take 500 mg by mouth 2 (two) times daily with a meal.       . NON FORMULARY COPD STUDY DRUG       . oxyCODONE-acetaminophen (ROXICET) 5-325 MG per tablet 1-2 tabs po q6h prn pain  60 tablet  0  . Potassium 99 MG TABS Take 1 tablet by mouth daily.        . Tamsulosin HCl (FLOMAX) 0.4 MG CAPS Take 1 capsule (0.4 mg total) by mouth daily.  90 capsule  2  . zolpidem (AMBIEN) 10 MG tablet Take 1 tablet (10 mg total) by mouth at bedtime as needed for sleep.  30 tablet  0  . zoster vaccine live, PF,  (ZOSTAVAX) 16109 UNT/0.65ML injection Inject 19,400 Units into the skin once.  1 vial  0   Last reviewed on 07/30/2012  3:15 PM by Jeoffrey Massed, MD  PE: Blood pressure 117/78, pulse 71, height 5' 10.5" (1.791 m), weight 313 lb (141.976 kg). Gen: Alert, well appearing, obese white male in NAD.  Patient is oriented to person, place, time, and situation. CV: RRR LUNGS: CTA bilat, nonlabored resps.   IMPRESSION AND PLAN:  HYPERTENSION Control MUCH improved now that he is back on all bp meds. May restart daily 325mg  ASA.  DEGENERATIVE JOINT DISEASE Multiple sites, responds very well to NSAIDs. Celebrex 200mg  samples x 2 wks given today (200mg  qd), need to check BMET in 10-14d. He'll drop off home bp readings when he comes in to lab visit for this.   An After Visit Summary was printed and given to the patient.  FOLLOW UP: 38mo

## 2012-07-30 NOTE — Telephone Encounter (Signed)
New problem:   Receive message from nurse Annice Pih on  1/13 @ 12:11 pm  to contact patient to make an appt.   Message is notate in the patient options tab under the patient message. Forwarding message to [place] for review.     Attaching message that scheduler had attempt to contact patient for an appt.   1. 1/14 @ 3:51 left message on home voice mail to call the office set up an appt.   per [place] message on  1/13. next available. appt   Edwena Mayorga    2. 1/17 @ 3:24  spoke with wife to have her hubsand call the office an make appt to see dr. Tenny Craw.    Gladie Gravette

## 2012-08-07 ENCOUNTER — Encounter: Payer: Self-pay | Admitting: Family Medicine

## 2012-08-07 NOTE — Assessment & Plan Note (Signed)
Multiple sites, responds very well to NSAIDs. Celebrex 200mg  samples x 2 wks given today (200mg  qd), need to check BMET in 10-14d. He'll drop off home bp readings when he comes in to lab visit for this.

## 2012-08-07 NOTE — Assessment & Plan Note (Signed)
Control MUCH improved now that he is back on all bp meds. May restart daily 325mg  ASA.

## 2012-08-09 ENCOUNTER — Encounter: Payer: Self-pay | Admitting: Family Medicine

## 2012-08-12 ENCOUNTER — Other Ambulatory Visit (INDEPENDENT_AMBULATORY_CARE_PROVIDER_SITE_OTHER): Payer: 59

## 2012-08-12 DIAGNOSIS — M1990 Unspecified osteoarthritis, unspecified site: Secondary | ICD-10-CM

## 2012-08-12 DIAGNOSIS — I10 Essential (primary) hypertension: Secondary | ICD-10-CM

## 2012-08-12 LAB — BASIC METABOLIC PANEL
CO2: 31 mEq/L (ref 19–32)
Calcium: 8.9 mg/dL (ref 8.4–10.5)
GFR: 86.18 mL/min (ref >60.00)
Glucose, Bld: 80 mg/dL (ref 70–99)
Potassium: 4 mEq/L (ref 3.5–5.1)
Sodium: 140 mEq/L (ref 135–145)

## 2012-08-12 NOTE — Progress Notes (Signed)
Labs only

## 2012-08-23 ENCOUNTER — Telehealth: Payer: Self-pay | Admitting: Family Medicine

## 2012-08-23 DIAGNOSIS — M25569 Pain in unspecified knee: Secondary | ICD-10-CM

## 2012-08-23 DIAGNOSIS — G8929 Other chronic pain: Secondary | ICD-10-CM

## 2012-08-23 NOTE — Telephone Encounter (Signed)
Caller: Scott Rollins/Patient; Phone: [phone]; Reason for Call: Pt calling that he would like a referral to a Dr.  For his joint pain.  He has heard of a Dr.  Dierdre Forth with [facility]  And they only take referrals.  Or does Dr.  Milinda Cave want to refer him to someone else?  Please call pt to advise.

## 2012-08-23 NOTE — Telephone Encounter (Signed)
I ordered a referral to an orthopedist (Dr. Cleophas Dunker), which is more appropriate for the patient's complaints than a rheumatologist (which is what Dr. Dierdre Forth is).-thx

## 2012-08-23 NOTE — Telephone Encounter (Signed)
Please advise.  Is this OK.  Do I need to call pt for more information?

## 2012-08-24 NOTE — Telephone Encounter (Signed)
Pt informed dr Milinda Cave has referral in for ortho

## 2012-08-31 ENCOUNTER — Telehealth: Payer: Self-pay | Admitting: Family Medicine

## 2012-08-31 DIAGNOSIS — G8929 Other chronic pain: Secondary | ICD-10-CM

## 2012-08-31 NOTE — Telephone Encounter (Signed)
Dr. Milinda Cave, currently you have a referral entered to Orthopaedic/Dr. Cleophas Dunker for chronic hip & knee pain for this patient. Patient does not want to see Dr. Cleophas Dunker. He is asking to please enter a rheumatology referral for him to see Dr. Dierdre Forth instead. Will you enter?

## 2012-09-01 NOTE — Telephone Encounter (Signed)
OK, referral to Dr. Dierdre Forth ordered.

## 2012-09-07 ENCOUNTER — Telehealth: Payer: Self-pay | Admitting: *Deleted

## 2012-09-07 NOTE — Telephone Encounter (Signed)
Message copied by Luisa Dago on Tue Sep 07, 2012  3:30 PM ------      Message from: Jeoffrey Massed      Created: Mon Aug 30, 2012  6:20 PM      Regarding: FW: gout       Steph, please call pt and tell him I have no knowledge of him having a diagnosis that would require a rheumatologist to see him.      I have in my records that he has degenerative arthritis but I don't have any record of him having any other kind of joint (or muscle problem).  That is why I chose to refer him to an orthopedist.  If he THINKS he may have gout or some other rheumatologic condition, then it is usually customary for me to do the initial workup and then refer if he has a problem a specialist is needed for OR if the diagnosis is unclear.  If he simply wants to see a rheumatologist b/c his joints hurt (and he doesn't want me to do any w/u), then I'll be glad to refer him to Dr. Dierdre Forth as per his request.  I just want to make sure we're on the same page, especially since last time I saw the patient he said that as long as he took one 200mg  celebrex per day his joints felt fine!      Let me know-thx             ----- Message -----         From: Duaine Dredge         Sent: 08/30/2012  10:23 AM           To: Jeoffrey Massed, MD, [name] S Tomerlin      Subject: gout                                                     Dr. Milinda Cave,            Currently you have a referral entered to [facility]/Dr. Cleophas Dunker for chronic hip & knee pain for this patient.  Patient does not want to see Dr. Cleophas Dunker.  He is asking to please enter a rheumatology referral for him to see Dr. Dierdre Forth instead.  Will you enter?       ------

## 2012-09-07 NOTE — Telephone Encounter (Signed)
I have attempted to contact this patient by phone with the following results: left message to return my call on answering machine (mobile).  

## 2012-09-10 NOTE — Telephone Encounter (Signed)
Message left at home number to return call.  Advised pt to choose option 1 and have [name] find me when he calls.

## 2012-09-11 DIAGNOSIS — K921 Melena: Secondary | ICD-10-CM

## 2012-09-11 HISTORY — DX: Melena: K92.1

## 2012-09-24 ENCOUNTER — Encounter: Payer: Self-pay | Admitting: Family Medicine

## 2012-10-05 NOTE — Telephone Encounter (Signed)
No return call from pt. He had appt with Dr.Beekman on 09/14/12.

## 2012-10-07 ENCOUNTER — Other Ambulatory Visit: Payer: Self-pay | Admitting: Family Medicine

## 2012-10-07 NOTE — Telephone Encounter (Signed)
Rx request to pharmacy/[facility]  

## 2012-10-25 ENCOUNTER — Ambulatory Visit: Payer: Self-pay | Admitting: Family Medicine

## 2012-11-02 ENCOUNTER — Encounter: Payer: Self-pay | Admitting: Gastroenterology

## 2012-11-02 ENCOUNTER — Other Ambulatory Visit: Payer: Self-pay

## 2012-11-02 NOTE — Progress Notes (Signed)
ERROR

## 2012-11-24 ENCOUNTER — Telehealth: Payer: Self-pay | Admitting: *Deleted

## 2012-11-24 NOTE — Telephone Encounter (Signed)
Message [Staff] From: McIvor, Berline Lopes - lab orders Patient is coming in Friday for fasting labs, I don't see a past note so I want to make sure it's just a lipid. The patient also wants to add a Uric Acid to this order.

## 2012-11-25 NOTE — Telephone Encounter (Signed)
CMET and FLP and uric acid pls.  Dx's are HTN, hyperlipidemia, and gout.-thx

## 2012-11-26 ENCOUNTER — Ambulatory Visit: Payer: 59 | Admitting: Family Medicine

## 2012-11-26 ENCOUNTER — Other Ambulatory Visit: Payer: Self-pay

## 2012-11-29 ENCOUNTER — Other Ambulatory Visit: Payer: 59

## 2012-11-30 ENCOUNTER — Ambulatory Visit (AMBULATORY_SURGERY_CENTER): Payer: 59

## 2012-11-30 ENCOUNTER — Encounter: Payer: Self-pay | Admitting: Gastroenterology

## 2012-11-30 VITALS — Ht 70.5 in | Wt 313.0 lb

## 2012-11-30 DIAGNOSIS — Z1211 Encounter for screening for malignant neoplasm of colon: Secondary | ICD-10-CM

## 2012-11-30 DIAGNOSIS — K625 Hemorrhage of anus and rectum: Secondary | ICD-10-CM

## 2012-11-30 NOTE — Progress Notes (Signed)
Pt came into the office today for his pre-visit prior to his colonoscopy with Dr Russella Dar on 12/16/12. Due to a complicated medical history, pt was scheduled to see Dr Russella Dar in the office on 12/15/12 to be evaluated. Pt understood. Appointment on 12/16/12 for his colonoscopy was cancelled

## 2012-12-01 ENCOUNTER — Encounter: Payer: Self-pay | Admitting: Family Medicine

## 2012-12-01 ENCOUNTER — Ambulatory Visit (INDEPENDENT_AMBULATORY_CARE_PROVIDER_SITE_OTHER): Payer: 59 | Admitting: Family Medicine

## 2012-12-01 VITALS — BP 156/90 | HR 73 | Temp 98.2°F | Resp 18 | Wt 314.5 lb

## 2012-12-01 DIAGNOSIS — I10 Essential (primary) hypertension: Secondary | ICD-10-CM

## 2012-12-01 DIAGNOSIS — E785 Hyperlipidemia, unspecified: Secondary | ICD-10-CM

## 2012-12-01 DIAGNOSIS — Z0000 Encounter for general adult medical examination without abnormal findings: Secondary | ICD-10-CM

## 2012-12-01 MED ORDER — COLCHICINE 0.6 MG PO TABS: 0.6000 mg | ORAL_TABLET | Freq: Two times a day (BID) | ORAL | Status: DC

## 2012-12-01 MED ORDER — GABAPENTIN 300 MG PO CAPS: 300.0000 mg | ORAL_CAPSULE | Freq: Three times a day (TID) | ORAL | Status: DC

## 2012-12-01 MED ORDER — TAMSULOSIN HCL 0.4 MG PO CAPS: 0.4000 mg | ORAL_CAPSULE | Freq: Every day | ORAL | Status: DC | PRN

## 2012-12-01 MED ORDER — CITALOPRAM HYDROBROMIDE 40 MG PO TABS: 40.0000 mg | ORAL_TABLET | Freq: Every day | ORAL | Status: DC | PRN

## 2012-12-01 MED ORDER — LOVASTATIN 20 MG PO TABS: ORAL_TABLET | ORAL | Status: DC

## 2012-12-01 MED ORDER — NIACIN 500 MG PO TABS: 500.0000 mg | ORAL_TABLET | Freq: Two times a day (BID) | ORAL | Status: DC

## 2012-12-01 MED ORDER — ALBUTEROL SULFATE HFA 108 (90 BASE) MCG/ACT IN AERS: 2.0000 | INHALATION_SPRAY | RESPIRATORY_TRACT | Status: DC | PRN

## 2012-12-01 MED ORDER — DOXAZOSIN MESYLATE 2 MG PO TABS: 2.0000 mg | ORAL_TABLET | Freq: Every day | ORAL | Status: DC

## 2012-12-01 MED ORDER — CYCLOBENZAPRINE HCL 10 MG PO TABS: 10.0000 mg | ORAL_TABLET | Freq: Every day | ORAL | Status: DC

## 2012-12-01 MED ORDER — ALLOPURINOL 100 MG PO TABS: 100.0000 mg | ORAL_TABLET | Freq: Every day | ORAL | Status: DC

## 2012-12-01 MED ORDER — HYDROCHLOROTHIAZIDE 25 MG PO TABS: 25.0000 mg | ORAL_TABLET | Freq: Every day | ORAL | Status: DC

## 2012-12-01 MED ORDER — BISOPROLOL-HYDROCHLOROTHIAZIDE 5-6.25 MG PO TABS: 1.0000 | ORAL_TABLET | Freq: Every day | ORAL | Status: DC

## 2012-12-01 MED ORDER — ZOLPIDEM TARTRATE 10 MG PO TABS: 10.0000 mg | ORAL_TABLET | Freq: Every evening | ORAL | Status: DC | PRN

## 2012-12-01 MED ORDER — LISINOPRIL 20 MG PO TABS: 20.0000 mg | ORAL_TABLET | Freq: Two times a day (BID) | ORAL | Status: DC

## 2012-12-01 MED ORDER — KETOCONAZOLE 2 % EX CREA: TOPICAL_CREAM | CUTANEOUS | Status: DC

## 2012-12-01 MED ORDER — OXYMORPHONE HCL 5 MG PO TABS: ORAL_TABLET | ORAL | Status: DC

## 2012-12-01 MED ORDER — FUROSEMIDE 40 MG PO TABS: 40.0000 mg | ORAL_TABLET | Freq: Every day | ORAL | Status: DC | PRN

## 2012-12-01 MED ORDER — CELECOXIB 200 MG PO CAPS: 200.0000 mg | ORAL_CAPSULE | Freq: Every day | ORAL | Status: DC | PRN

## 2012-12-01 MED ORDER — NIACIN ER (ANTIHYPERLIPIDEMIC) 1000 MG PO TBCR: 1000.0000 mg | EXTENDED_RELEASE_TABLET | Freq: Every day | ORAL | Status: DC

## 2012-12-01 MED ORDER — ALBUTEROL SULFATE (2.5 MG/3ML) 0.083% IN NEBU: 2.5000 mg | INHALATION_SOLUTION | RESPIRATORY_TRACT | Status: DC | PRN

## 2012-12-01 NOTE — Progress Notes (Signed)
OFFICE NOTE  12/01/2012  CC:  Chief Complaint  Patient presents with  . Follow-up    4-mth [HTN; Hyperlipid; Osteoarthritis, Obesity]  . Medication Refill    Patient request medication refills.     HPI: Patient is a 62 y.o. Caucasian male who is here for 4 mo f/u HTN, hyperlipidemia, OSA-on CPAP, gout. Had some BRBPR after BM's --onset about 2 mo ago.  He cut out the fiber in his diet and the bleeding resolved.  He tried adding fiber back and the bleeding restarted.  He subsequently got an appt with his GI MD, Dr. Russella Dar.  He then realized after doing some research that his hx of prostate cancer treatment with radiation makes radiation proctitis and rectal bleeding much more likely so he feels like maybe this is the explanation.  However, he still has an appt w/GI 12/15/12.  Pain in joints (essentially everything below the waist) still problematic, knows he needs to avoid NSAIDs, says oxycodone in the past (up to 15mg  per dose) didn't help.  Says he tried one of his wife's opana and it allowed him enough relief to get to sleep at night. This is his goal with pain med treatment--to get enough relief to be able to go to sleep without such pain.  He has been busy with urology f/u, etc-didn't f/u with rheum, had a small flare 1 mo ago when he ran out of colcrys briefly. He missed his lab appt here for fasting labs/uric acid level.  Pertinent PMH:  Past Medical History  Diagnosis Date  . OSA on CPAP     RestMed S6 machine, set at 4.6-6.4  . Osteoarthrosis, unspecified whether generalized or localized, unspecified site     DDD  . Hyperlipidemia   . Hypertension   . Coronary atherosclerosis of unspecified type of vessel, native or graft 2005    MI, s/p PCI with stent (pt reports 20+ interventions in the past, most recent cat 6/08 showed patent stents).  Normal LV function.  Nuclear stress test NEG 8/09.  Marland Kitchen COPD (chronic obstructive pulmonary disease)     on "study med"  . Depression    ?bipolar dx by psychiatrist?  . Anxiety   . 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 ED (erectile dysfunction)   . Obesities, morbid   . 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 Diverticulosis 2006  . Prostate cancer 09/2010    Localized, high risk disease (Dr. Kimbrough/Grapey):  s/p 5 wks external beam radiation + seed boost, as well as hormone blockade x 1 yr.  . Seizure disorder     Deja vu sensations: no w/u.  These stopped when neurontin 300mg  tid was started for a different reason.  . Gouty arthritis     Dr. Dierdre Forth  . Seizures   . Radiation      25 treatments/01/2011  . Status post chemotherapy     10/2010 thru 06/2011   Past Surgical History  Procedure Laterality Date  . Seed implants      02/24/2011/ in prostate  . Cardiac catheterization  23 caths    pt states he has 6 stents     MEDS:  Outpatient Prescriptions Prior to Visit  Medication Sig Dispense Refill  . albuterol (PROAIR HFA) 108 (90 BASE) MCG/ACT inhaler Inhale 2 puffs into the lungs every 4 (four) hours as needed.        Marland Kitchen albuterol (PROVENTIL) (2.5 MG/3ML) 0.083%  nebulizer solution Take 2.5 mg by nebulization every 4 (four) hours as needed.        Marland Kitchen allopurinol (ZYLOPRIM) 100 MG tablet Take 100 mg by mouth daily.      Marland Kitchen aspirin 325 MG tablet Take 325 mg by mouth daily.        . bisoprolol-hydrochlorothiazide (ZIAC) 5-6.25 MG per tablet Take 1 tablet by mouth daily.  90 tablet  2  . colchicine 0.6 MG tablet Take 0.6 mg by mouth 2 (two) times daily.      . cyclobenzaprine (FLEXERIL) 10 MG tablet Take 1 tablet (10 mg total) by mouth daily.  90 tablet  2  . doxazosin (CARDURA) 2 MG tablet Take 1 tablet (2 mg total) by mouth at bedtime.  30 tablet  6  . furosemide (LASIX) 40 MG tablet Take 40 mg by mouth daily as needed.        . gabapentin (NEURONTIN) 300 MG capsule Take 1 capsule (300 mg total) by mouth 3 (three) times daily. As per Urology  90 capsule  6  .  hydrochlorothiazide (HYDRODIURIL) 25 MG tablet Take 1 tablet (25 mg total) by mouth daily.  30 tablet  6  . ketoconazole (NIZORAL) 2 % cream Apply to affected areas twice daily  75 g  5  . lisinopril (PRINIVIL,ZESTRIL) 20 MG tablet Take 1 tablet (20 mg total) by mouth 2 (two) times daily.  180 tablet  2  . lovastatin (MEVACOR) 20 MG tablet TAKE ONE TABLET BY MOUTH TWICE DAILY  180 tablet  0  . niacin (NIASPAN) 1000 MG CR tablet Take 1 tablet (1,000 mg total) by mouth at bedtime.  30 tablet  5  . niacin 500 MG tablet Take 500 mg by mouth 2 (two) times daily with a meal.       . NON FORMULARY COPD STUDY DRUG       . Potassium 99 MG TABS Take 1 tablet by mouth daily.        Marland Kitchen zolpidem (AMBIEN) 10 MG tablet Take 1 tablet (10 mg total) by mouth at bedtime as needed for sleep.  30 tablet  0  . celecoxib (CELEBREX) 200 MG capsule Take 1 capsule (200 mg total) by mouth daily.  30 capsule  6  . citalopram (CELEXA) 40 MG tablet Take 1 tablet (40 mg total) by mouth daily.  90 tablet  2  . Tamsulosin HCl (FLOMAX) 0.4 MG CAPS Take 1 capsule (0.4 mg total) by mouth daily.  90 capsule  2  . zoster vaccine live, PF, (ZOSTAVAX) 47829 UNT/0.65ML injection Inject 19,400 Units into the skin once.  1 vial  0   No facility-administered medications prior to visit.    PE: Blood pressure 156/90, pulse 73, temperature 98.2 F (36.8 C), temperature source Oral, resp. rate 18, weight 314 lb 8 oz (142.656 kg), SpO2 94.00%. Gen: Alert, well appearing, obese white male.  Patient is oriented to person, place, time, and situation. AFFECT: pleasant, lucid thought and speech. BACK: diffuse low back myofascial tenderness to palpation, pain with all ROM of L-spine. Knees: no erythema, effusion, or warmth.  Mild pain/crepitus with passive flexion/extension.  No instability/laxity. Ankles: mild pain with passive ROM.  No swelling, erythema, or warmth.  Nontender.   DP pulses 2+ bilat   IMPRESSION AND PLAN:  1) HTN, fairly  stable.  Pt with compliance issues historically.  No change in meds today.  2) Hyperlipidemia; still needs to return ASAP for fasting labs.  3) Gout: needs recheck uric acid--up titrate allopurinol if level not <6. His chronic back, knees, and ankles pain is likely osteoarthritis + gouty arthritis+ myofascial pain secondary to obesty. Will do trial of opana 5mg , 1 tab po qhs, #30, no RF.  Therapeutic expectations and side effect profile of medication discussed today.  Patient's questions answered. If he stays on this, we'll do appropriate pain contract discussion/signing, etc.  4) Rectal bleeding, intermittent.  GI eval set for early next month.  Spent 30 min with pt today, with >50% of this time spent in counseling and care coordination regarding the above problems.  FOLLOW UP: 1 mo

## 2012-12-02 ENCOUNTER — Other Ambulatory Visit (INDEPENDENT_AMBULATORY_CARE_PROVIDER_SITE_OTHER): Payer: 59

## 2012-12-02 DIAGNOSIS — I10 Essential (primary) hypertension: Secondary | ICD-10-CM

## 2012-12-02 DIAGNOSIS — M109 Gout, unspecified: Secondary | ICD-10-CM

## 2012-12-02 DIAGNOSIS — E785 Hyperlipidemia, unspecified: Secondary | ICD-10-CM

## 2012-12-02 LAB — LIPID PANEL
Cholesterol: 199 mg/dL (ref 0–200)
VLDL: 42.4 mg/dL — ABNORMAL HIGH (ref 0.0–40.0)

## 2012-12-02 LAB — COMPREHENSIVE METABOLIC PANEL
ALT: 25 U/L (ref 0–53)
Albumin: 3.8 g/dL (ref 3.5–5.2)
CO2: 28 mEq/L (ref 19–32)
Calcium: 8.6 mg/dL (ref 8.4–10.5)
Chloride: 101 mEq/L (ref 96–112)
GFR: 84.03 mL/min (ref >60.00)
Glucose, Bld: 93 mg/dL (ref 70–99)
Potassium: 3.7 mEq/L (ref 3.5–5.1)
Sodium: 137 mEq/L (ref 135–145)
Total Bilirubin: 0.7 mg/dL (ref 0.3–1.2)
Total Protein: 6.9 g/dL (ref 6.0–8.3)

## 2012-12-02 NOTE — Progress Notes (Signed)
Labs only

## 2012-12-03 ENCOUNTER — Encounter: Payer: Self-pay | Admitting: Family Medicine

## 2012-12-03 LAB — LDL CHOLESTEROL, DIRECT: Direct LDL: 130.7 mg/dL

## 2012-12-09 ENCOUNTER — Ambulatory Visit (INDEPENDENT_AMBULATORY_CARE_PROVIDER_SITE_OTHER): Payer: 59 | Admitting: *Deleted

## 2012-12-09 DIAGNOSIS — Z23 Encounter for immunization: Secondary | ICD-10-CM

## 2012-12-09 DIAGNOSIS — Z2911 Encounter for prophylactic immunotherapy for respiratory syncytial virus (RSV): Secondary | ICD-10-CM

## 2012-12-09 NOTE — Progress Notes (Signed)
  Subjective:    Patient ID: Scott Rollins, male    DOB: Jul 03, 1951, 62 y.o.   MRN: [id number]  HPI    Review of Systems     Objective:   Physical Exam        Assessment & Plan:  Patient presented for Zostavax vaccine, tolerated injection, VIS given/SLS

## 2012-12-12 ENCOUNTER — Encounter: Payer: Self-pay | Admitting: Family Medicine

## 2012-12-15 ENCOUNTER — Ambulatory Visit: Payer: Self-pay | Admitting: Gastroenterology

## 2012-12-16 ENCOUNTER — Encounter: Payer: Self-pay | Admitting: Gastroenterology

## 2012-12-16 ENCOUNTER — Telehealth: Payer: Self-pay | Admitting: *Deleted

## 2012-12-16 DIAGNOSIS — I10 Essential (primary) hypertension: Secondary | ICD-10-CM

## 2012-12-16 DIAGNOSIS — M109 Gout, unspecified: Secondary | ICD-10-CM

## 2012-12-16 DIAGNOSIS — E785 Hyperlipidemia, unspecified: Secondary | ICD-10-CM

## 2012-12-16 MED ORDER — ALLOPURINOL 300 MG PO TABS: 300.0000 mg | ORAL_TABLET | Freq: Every day | ORAL | Status: DC

## 2012-12-16 MED ORDER — LOVASTATIN 20 MG PO TABS: 40.0000 mg | ORAL_TABLET | Freq: Two times a day (BID) | ORAL | Status: DC

## 2012-12-16 NOTE — Telephone Encounter (Signed)
Message copied by Regis Bill on Thu Dec 16, 2012 11:20 AM ------      Message from: Jeoffrey Massed      Created: Fri Dec 03, 2012  3:28 PM       Pls notify pt that his recent labs showed elevated uric acid level of 9.3 (his goal is <6).  I recommend he increase his allopurinol to 200mg  daily for 1 week and then increase to 300mg  once a day.      Also, his cholesterol panel showed that he is in need of higher dosing of his mevacor: I recommend he increase to 40mg  twice daily.      Lab visit to recheck fasting cholesterol and also uric acid in 2 mo.-thx ------

## 2012-12-16 NOTE — Telephone Encounter (Signed)
Notes Recorded by Regis Bill, [name] on 12/10/2012 at 11:27 AM [facility] with contact name and number for return call RE: results and further provider instructions/SLS  Notes Recorded by Regis Bill, [name] on 12/09/2012 at 5:25 PM [facility] with contact name and number for return call RE: results and further provider instructions/SLS  Notes Recorded by Regis Bill, [name] on 12/03/2012 at 5:07 PM [facility] with contact name and number for return call RE: results and further provider instructions/SLS  Spoke with pt RE: results & further provider instructions, pt understood & agreed; new Rx[s] to pharmacy at pt request, future lab orders placed & pt will call to schedule lab appt/SLS

## 2013-01-03 ENCOUNTER — Encounter: Payer: Self-pay | Admitting: Gastroenterology

## 2013-01-03 ENCOUNTER — Ambulatory Visit (INDEPENDENT_AMBULATORY_CARE_PROVIDER_SITE_OTHER): Payer: 59 | Admitting: Gastroenterology

## 2013-01-03 ENCOUNTER — Other Ambulatory Visit: Payer: Self-pay | Admitting: Gastroenterology

## 2013-01-03 VITALS — BP 120/68 | HR 100 | Ht 70.5 in | Wt 311.0 lb

## 2013-01-03 DIAGNOSIS — K921 Melena: Secondary | ICD-10-CM

## 2013-01-03 MED ORDER — PEG-KCL-NACL-NASULF-NA ASC-C 100 G PO SOLR: 1.0000 | Freq: Once | ORAL | Status: DC

## 2013-01-03 NOTE — Patient Instructions (Addendum)
You have been scheduled for a colonoscopy with propofol. Please follow written instructions given to you at your visit today.  Please pick up your prep kit at the pharmacy within the next 1-3 days. If you use inhalers (even only as needed), please bring them with you on the day of your procedure. Your physician has requested that you go to www.startemmi.com and enter the access code given to you at your visit today. This web site gives a general overview about your procedure. However, you should still follow specific instructions given to you by our office regarding your preparation for the procedure.  Thank you for choosing me and [facility].  Venita Lick. Pleas Koch., MD., [facility]  cc: Nicoletta Ba, MD

## 2013-01-03 NOTE — Progress Notes (Signed)
History of Present Illness: This is a 62 year old male with hematochezia for 2 months. He previously underwent colonoscopy in 2006 by Dr. Renelda Mom showing diverticulosis and a ganglioneuroma. He was diagnosed with prostate cancer in 2012 and underwent external beam radiation and radiation seed implantation. He notes small volume hematochezia intermittently associated with bowel movements. Denies weight loss, abdominal pain, constipation, diarrhea, change in stool caliber, melena, nausea, vomiting, dysphagia, reflux symptoms, chest pain.  Review of Systems: Pertinent positive and negative review of systems were noted in the above HPI section. All other review of systems were otherwise negative.  Current Medications, Allergies, Past Medical History, Past Surgical History, Family History and Social History were reviewed in [employer] record.  Physical Exam: General: Well developed , well nourished, no acute distress Head: Normocephalic and atraumatic Eyes:  sclerae anicteric, EOMI Ears: Normal auditory acuity Mouth: No deformity or lesions Neck: Supple, no masses or thyromegaly Lungs: Clear throughout to auscultation Heart: Regular rate and rhythm; no murmurs, rubs or bruits Abdomen: Soft, non tender and non distended. No masses, hepatosplenomegaly or hernias noted. Normal Bowel sounds Rectal: Deferred to colonoscopy Musculoskeletal: Symmetrical with no gross deformities  Skin: No lesions on visible extremities Pulses:  Normal pulses noted Extremities: No clubbing, cyanosis, edema or deformities noted Neurological: Alert oriented x 4, grossly nonfocal Cervical Nodes:  No significant cervical adenopathy Inguinal Nodes: No significant inguinal adenopathy Psychological:  Alert and cooperative. Normal mood and affect  Assessment and Recommendations:  1. Hematochezia. Rule out radiation proctitis, colorectal neoplasms, hemorrhoids and other disorders. He has multiple  comorbidities. The risks, benefits, and alternatives to colonoscopy with possible biopsy and possible polypectomy were discussed with the patient and they consent to proceed.

## 2013-01-06 ENCOUNTER — Telehealth: Payer: Self-pay | Admitting: Gastroenterology

## 2013-01-06 NOTE — Telephone Encounter (Signed)
Patient notified of change to 04/05/13 8:30.  He is aware and ok with the dates

## 2013-01-07 ENCOUNTER — Telehealth: Payer: Self-pay | Admitting: Family Medicine

## 2013-01-07 DIAGNOSIS — Z0000 Encounter for general adult medical examination without abnormal findings: Secondary | ICD-10-CM

## 2013-01-07 DIAGNOSIS — E785 Hyperlipidemia, unspecified: Secondary | ICD-10-CM

## 2013-01-07 DIAGNOSIS — I10 Essential (primary) hypertension: Secondary | ICD-10-CM

## 2013-01-07 NOTE — Telephone Encounter (Signed)
Patient needs to pu today, going out of town for a week. Will run out while gone.

## 2013-01-07 NOTE — Telephone Encounter (Signed)
Both medications were already sent into [facility]. Called pharmacy to confirm and pharmacy stated that they had received rx's. Patient notified.

## 2013-02-23 ENCOUNTER — Other Ambulatory Visit: Payer: Self-pay | Admitting: Family Medicine

## 2013-02-23 MED ORDER — DOXAZOSIN MESYLATE 2 MG PO TABS: 2.0000 mg | ORAL_TABLET | Freq: Every day | ORAL | Status: DC

## 2013-02-28 ENCOUNTER — Other Ambulatory Visit: Payer: Self-pay | Admitting: *Deleted

## 2013-03-01 ENCOUNTER — Other Ambulatory Visit: Payer: Self-pay | Admitting: *Deleted

## 2013-03-01 MED ORDER — DOXAZOSIN MESYLATE 2 MG PO TABS: 2.0000 mg | ORAL_TABLET | Freq: Every day | ORAL | Status: DC

## 2013-03-01 NOTE — Telephone Encounter (Signed)
Resent rx to [facility].

## 2013-03-09 ENCOUNTER — Encounter (HOSPITAL_COMMUNITY): Payer: Self-pay | Admitting: *Deleted

## 2013-03-17 ENCOUNTER — Encounter (HOSPITAL_COMMUNITY): Payer: Self-pay | Admitting: Pharmacy Technician

## 2013-04-04 ENCOUNTER — Other Ambulatory Visit: Payer: Self-pay | Admitting: Family Medicine

## 2013-04-04 NOTE — Telephone Encounter (Signed)
Please call patient to make an appointment.   He needs an appointment before I will fill any of his medications.  Patient was last seen in May and was supposed to have a one month follow up.  Thanks

## 2013-04-05 ENCOUNTER — Other Ambulatory Visit: Payer: Self-pay

## 2013-04-05 ENCOUNTER — Encounter (HOSPITAL_COMMUNITY): Payer: Self-pay

## 2013-04-05 ENCOUNTER — Encounter (HOSPITAL_COMMUNITY)
Admission: RE | Disposition: A | Discharge status: Home / Self Care | Payer: Self-pay | Source: Ambulatory Visit | Attending: Gastroenterology

## 2013-04-05 ENCOUNTER — Ambulatory Visit (HOSPITAL_COMMUNITY): Payer: 59 | Admitting: Anesthesiology

## 2013-04-05 ENCOUNTER — Encounter (HOSPITAL_COMMUNITY): Payer: Self-pay | Admitting: Anesthesiology

## 2013-04-05 ENCOUNTER — Ambulatory Visit (HOSPITAL_COMMUNITY)
Admission: RE | Admit: 2013-04-05 | Discharge: 2013-04-05 | Disposition: A | Discharge status: Home / Self Care | Payer: 59 | Source: Ambulatory Visit | Attending: Gastroenterology | Admitting: Gastroenterology

## 2013-04-05 DIAGNOSIS — K648 Other hemorrhoids: Secondary | ICD-10-CM | POA: Insufficient documentation

## 2013-04-05 DIAGNOSIS — K625 Hemorrhage of anus and rectum: Secondary | ICD-10-CM

## 2013-04-05 DIAGNOSIS — K5730 Diverticulosis of large intestine without perforation or abscess without bleeding: Secondary | ICD-10-CM | POA: Insufficient documentation

## 2013-04-05 DIAGNOSIS — C61 Malignant neoplasm of prostate: Secondary | ICD-10-CM | POA: Insufficient documentation

## 2013-04-05 DIAGNOSIS — K921 Melena: Secondary | ICD-10-CM

## 2013-04-05 DIAGNOSIS — K620 Anal polyp: Secondary | ICD-10-CM | POA: Insufficient documentation

## 2013-04-05 DIAGNOSIS — D126 Benign neoplasm of colon, unspecified: Secondary | ICD-10-CM

## 2013-04-05 HISTORY — PX: COLONOSCOPY WITH PROPOFOL: SHX5780

## 2013-04-05 HISTORY — PX: HOT HEMOSTASIS: SHX5433

## 2013-04-05 LAB — BASIC METABOLIC PANEL
BUN: 10 mg/dL (ref 6–23)
CO2: 26 mEq/L (ref 19–32)
Calcium: 8.8 mg/dL (ref 8.4–10.5)
Creatinine, Ser: 0.8 mg/dL (ref 0.50–1.35)
GFR calc Af Amer: >90 mL/min (ref >90)
GFR calc non Af Amer: >90 mL/min (ref >90)
Sodium: 135 mEq/L (ref 135–145)

## 2013-04-05 LAB — HEMOGLOBIN AND HEMATOCRIT, BLOOD
HCT: 45.4 % (ref 39.0–52.0)
Hemoglobin: 15.4 g/dL (ref 13.0–17.0)

## 2013-04-05 SURGERY — COLONOSCOPY WITH PROPOFOL
Anesthesia: Monitor Anesthesia Care

## 2013-04-05 MED ORDER — KETAMINE HCL 10 MG/ML IJ SOLN
INTRAMUSCULAR | Status: DC | PRN
  Administered 2013-04-05: 20 mg via INTRAVENOUS
  Administered 2013-04-05 (×2): 10 mg via INTRAVENOUS

## 2013-04-05 MED ORDER — PROPOFOL 10 MG/ML IV BOLUS
INTRAVENOUS | Status: DC | PRN
  Administered 2013-04-05 (×2): 20 mg via INTRAVENOUS

## 2013-04-05 MED ORDER — LACTATED RINGERS IV SOLN
INTRAVENOUS | Status: DC
  Administered 2013-04-05: 08:00:00 via INTRAVENOUS

## 2013-04-05 MED ORDER — FENTANYL CITRATE 0.05 MG/ML IJ SOLN: 25.0000 ug | INTRAMUSCULAR | Status: DC | PRN

## 2013-04-05 MED ORDER — SODIUM CHLORIDE 0.9 % IV SOLN: INTRAVENOUS | Status: DC

## 2013-04-05 MED ORDER — PROPOFOL INFUSION 10 MG/ML OPTIME
INTRAVENOUS | Status: DC | PRN
  Administered 2013-04-05: 120 ug/kg/min via INTRAVENOUS

## 2013-04-05 MED ORDER — MIDAZOLAM HCL 5 MG/5ML IJ SOLN
INTRAMUSCULAR | Status: DC | PRN
  Administered 2013-04-05: 1 mg via INTRAVENOUS
  Administered 2013-04-05: 2 mg via INTRAVENOUS

## 2013-04-05 SURGICAL SUPPLY — 21 items

## 2013-04-05 NOTE — Anesthesia Postprocedure Evaluation (Signed)
  Anesthesia Post-op Note  Patient: Scott Rollins  Procedure(s) Performed: Procedure(s) (LRB): COLONOSCOPY WITH PROPOFOL (N/A) HOT HEMOSTASIS (ARGON PLASMA COAGULATION/BICAP) (N/A)  Patient Location: PACU  Anesthesia Type: MAC  Level of Consciousness: awake and alert   Airway and Oxygen Therapy: Patient Spontanous Breathing  Post-op Pain: mild  Post-op Assessment: Post-op Vital signs reviewed, Patient's Cardiovascular Status Stable, Respiratory Function Stable, Patent Airway and No signs of Nausea or vomiting  Last Vitals:  Filed Vitals:   04/05/13 0950  BP: 180/103  Temp:   Resp: 18    Post-op Vital Signs: stable   Complications: No apparent anesthesia complications

## 2013-04-05 NOTE — Telephone Encounter (Signed)
Patient has a hospital admit today. I will try to call him when he gets out of the hospital.

## 2013-04-05 NOTE — Interval H&P Note (Signed)
History and Physical Interval Note:  04/05/2013 8:45 AM  Scott Rollins  has presented today for surgery, with the diagnosis of Blood in stool [578.1]  The various methods of treatment have been discussed with the patient and family. After consideration of risks, benefits and other options for treatment, the patient has consented to  Procedure(s): COLONOSCOPY WITH PROPOFOL (N/A) HOT HEMOSTASIS (ARGON PLASMA COAGULATION/BICAP) (N/A) as a surgical intervention .  The patient's history has been reviewed, patient examined, no change in status, stable for surgery.  I have reviewed the patient's chart and labs.  Questions were answered to the patient's satisfaction.     Venita Lick. Russella Dar MD [facility]

## 2013-04-05 NOTE — Transfer of Care (Signed)
Immediate Anesthesia Transfer of Care Note  Patient: Scott Rollins  Procedure(s) Performed: Procedure(s): COLONOSCOPY WITH PROPOFOL (N/A) HOT HEMOSTASIS (ARGON PLASMA COAGULATION/BICAP) (N/A)  Patient Location: PACU and Endoscopy Unit  Anesthesia Type:MAC  Level of Consciousness: awake, alert , oriented and patient cooperative  Airway & Oxygen Therapy: Patient Spontanous Breathing and Patient connected to face mask oxygen  Post-op Assessment: Report given to PACU RN, Post -op Vital signs reviewed and stable and Patient moving all extremities X 4  Post vital signs: Reviewed and stable  Complications: No apparent anesthesia complications

## 2013-04-05 NOTE — H&P (Signed)
History of Present Illness: This is a 62 year old male with hematochezia for 2 months. He previously underwent colonoscopy in 2006 by Dr. Renelda Mom showing diverticulosis and a ganglioneuroma. He was diagnosed with prostate cancer in 2012 and underwent external beam radiation and radiation seed implantation. He notes small volume hematochezia intermittently associated with bowel movements. Denies weight loss, abdominal pain, constipation, diarrhea, change in stool caliber, melena, nausea, vomiting, dysphagia, reflux symptoms, chest pain.   Review of Systems: Pertinent positive and negative review of systems were noted in the above HPI section. All other review of systems were otherwise negative.  Current Medications, Allergies, Past Medical History, Past Surgical History, Family History and Social History were reviewed in [employer] record.   Physical Exam:  General: Well developed , well nourished, no acute distress  Head: Normocephalic and atraumatic  Eyes: sclerae anicteric, EOMI  Ears: Normal auditory acuity  Mouth: No deformity or lesions  Neck: Supple, no masses or thyromegaly  Lungs: Clear throughout to auscultation  Heart: Regular rate and rhythm; no murmurs, rubs or bruits  Abdomen: Soft, non tender and non distended. No masses, hepatosplenomegaly or hernias noted. Normal Bowel sounds  Rectal: Deferred to colonoscopy  Musculoskeletal: Symmetrical with no gross deformities  Skin: No lesions on visible extremities  Pulses: Normal pulses noted  Extremities: No clubbing, cyanosis, edema or deformities noted  Neurological: Alert oriented x 4, grossly nonfocal  Cervical Nodes: No significant cervical adenopathy  Inguinal Nodes: No significant inguinal adenopathy  Psychological: Alert and cooperative. Normal mood and affect   Assessment and Recommendations:   1. Hematochezia. Rule out radiation proctitis, colorectal neoplasms, hemorrhoids and other disorders. He  has multiple comorbidities. The risks, benefits, and alternatives to colonoscopy with possible biopsy and possible polypectomy were discussed with the patient and they consent to proceed.

## 2013-04-05 NOTE — Op Note (Signed)
[facility] [address] [place], [address]   COLONOSCOPY PROCEDURE REPORT  PATIENT: Scott, Rollins  MR#: [id number] BIRTHDATE: 06-13-1951 , 62  yrs. old GENDER: Male ENDOSCOPIST: Meryl Dare, MD, [facility] REFERRED XB:[id number] Milinda Cave, M.D. PROCEDURE DATE:  04/05/2013 PROCEDURE:   Colonoscopy with snare polypectomy First Screening Colonoscopy - Avg.  risk and is 50 yrs.  old or older - No.  Prior Negative Screening - Now for repeat screening. N/A  History of Adenoma - Now for follow-up colonoscopy & has been > or = to 3 yrs.  N/A  Polyps Removed Today? Yes. ASA CLASS:   Class III INDICATIONS:hematochezia. MEDICATIONS: MAC sedation, administered by CRNA DESCRIPTION OF PROCEDURE:   After the risks benefits and alternatives of the procedure were thoroughly explained, informed consent was obtained.  A digital rectal exam revealed no abnormalities of the rectum.   The Pentax Ped Colon [id number] endoscope was introduced through the anus and advanced to the cecum, which was identified by both the appendix and ileocecal valve. No adverse events experienced.   The quality of the prep was good, using MoviPrep  The instrument was then slowly withdrawn as the colon was fully examined.  COLON FINDINGS: Moderate diverticulosis was noted in the descending colon and sigmoid colon. A sessile polyp measuring 6 mm in size was found in the rectum.  A polypectomy was performed with a cold snare.  The resection was complete and the polyp tissue was completely retrieved.   The colon was otherwise normal.  There was no diverticulosis, inflammation, polyps or cancers unless previously stated. No evidence of radiation proctitis. Retroflexed views revealed moderate internal hemorrhoids. The time to cecum=2 minutes 30 seconds.  Withdrawal time=12 minutes 00 seconds.  The scope was withdrawn and the procedure completed. COMPLICATIONS: There were no complications.  ENDOSCOPIC  IMPRESSION: 1.   Moderate diverticulosis was noted in the descending colon and sigmoid colon 2.   Sessile polyp measuring 6 mm in the rectum; polypectomy performed with a cold snare 3.   Moderate internal hemorrhoids  RECOMMENDATIONS: 1.  Await pathology results 2.  Repeat colonoscopy in 5 years if polyp adenomatous; otherwise 10 years 3.  High fiber diet with liberal fluid intake.  eSigned:  Meryl Dare, MD, [facility] 04/05/2013 9:21 AM

## 2013-04-05 NOTE — Anesthesia Preprocedure Evaluation (Addendum)
Anesthesia Evaluation  Patient identified by MRN, date of birth, ID band Patient awake    Reviewed: Allergy & Precautions, H&P , NPO status , Patient's Chart, lab work & pertinent test results  Airway Mallampati: II TM Distance: >3 FB Neck ROM: full    Dental no notable dental hx. (+) Teeth Intact and Dental Advisory Given   Pulmonary neg pulmonary ROS, COPDCurrent Smoker,  breath sounds clear to auscultation  Pulmonary exam normal       Cardiovascular Exercise Tolerance: Good hypertension, Pt. on medications + CAD, + Past MI and + Cardiac Stents negative cardio ROS  Rhythm:regular Rate:Normal     Neuro/Psych negative neurological ROS  negative psych ROS   GI/Hepatic negative GI ROS, Neg liver ROS,   Endo/Other  negative endocrine ROSMorbid obesity  Renal/GU negative Renal ROS  negative genitourinary   Musculoskeletal   Abdominal (+) + obese,   Peds  Hematology negative hematology ROS (+)   Anesthesia Other Findings   Reproductive/Obstetrics negative OB ROS                          Anesthesia Physical Anesthesia Plan  ASA: III  Anesthesia Plan: MAC   Post-op Pain Management:    Induction:   Airway Management Planned: Simple Face Mask  Additional Equipment:   Intra-op Plan:   Post-operative Plan:   Informed Consent: I have reviewed the patients History and Physical, chart, labs and discussed the procedure including the risks, benefits and alternatives for the proposed anesthesia with the patient or authorized representative who has indicated his/her understanding and acceptance.   Dental Advisory Given  Plan Discussed with: CRNA and Surgeon  Anesthesia Plan Comments:         Anesthesia Quick Evaluation

## 2013-04-06 ENCOUNTER — Encounter (HOSPITAL_COMMUNITY): Payer: Self-pay | Admitting: Gastroenterology

## 2013-04-07 ENCOUNTER — Encounter: Payer: Self-pay | Admitting: Gastroenterology

## 2013-04-08 MED ORDER — HYDROCHLOROTHIAZIDE 25 MG PO TABS: 25.0000 mg | ORAL_TABLET | Freq: Every morning | ORAL | Status: DC

## 2013-04-08 NOTE — Telephone Encounter (Signed)
Patient has scheduled his lab appt for Mon 9/29 and an OV for Wed 10/1. Please enter lab order. Thanks

## 2013-04-11 ENCOUNTER — Other Ambulatory Visit: Payer: Medicare Other

## 2013-04-13 ENCOUNTER — Ambulatory Visit: Payer: Medicare Other | Admitting: Family Medicine

## 2013-04-19 ENCOUNTER — Other Ambulatory Visit (INDEPENDENT_AMBULATORY_CARE_PROVIDER_SITE_OTHER): Payer: 59

## 2013-04-19 DIAGNOSIS — E785 Hyperlipidemia, unspecified: Secondary | ICD-10-CM

## 2013-04-19 DIAGNOSIS — I10 Essential (primary) hypertension: Secondary | ICD-10-CM

## 2013-04-19 DIAGNOSIS — M109 Gout, unspecified: Secondary | ICD-10-CM

## 2013-04-19 LAB — LIPID PANEL
Cholesterol: 199 mg/dL (ref 0–200)
LDL Cholesterol: 135 mg/dL — ABNORMAL HIGH (ref 0–99)
Triglycerides: 156 mg/dL — ABNORMAL HIGH (ref 0.0–149.0)
VLDL: 31.2 mg/dL (ref 0.0–40.0)

## 2013-04-19 LAB — URIC ACID: Uric Acid, Serum: 10.6 mg/dL — ABNORMAL HIGH (ref 4.0–7.8)

## 2013-04-19 NOTE — Progress Notes (Signed)
Labs only

## 2013-04-22 ENCOUNTER — Encounter: Payer: Self-pay | Admitting: Family Medicine

## 2013-04-22 ENCOUNTER — Ambulatory Visit (INDEPENDENT_AMBULATORY_CARE_PROVIDER_SITE_OTHER): Payer: 59 | Admitting: Family Medicine

## 2013-04-22 DIAGNOSIS — J438 Other emphysema: Secondary | ICD-10-CM

## 2013-04-22 DIAGNOSIS — E785 Hyperlipidemia, unspecified: Secondary | ICD-10-CM

## 2013-04-22 DIAGNOSIS — I10 Essential (primary) hypertension: Secondary | ICD-10-CM | POA: Diagnosis not present

## 2013-04-22 DIAGNOSIS — M1A00X0 Idiopathic chronic gout, unspecified site, without tophus (tophi): Secondary | ICD-10-CM

## 2013-04-22 DIAGNOSIS — Z23 Encounter for immunization: Secondary | ICD-10-CM

## 2013-04-22 DIAGNOSIS — M1A9XX0 Chronic gout, unspecified, without tophus (tophi): Secondary | ICD-10-CM | POA: Insufficient documentation

## 2013-04-22 DIAGNOSIS — M1990 Unspecified osteoarthritis, unspecified site: Secondary | ICD-10-CM

## 2013-04-22 MED ORDER — GABAPENTIN 300 MG PO CAPS: 300.0000 mg | ORAL_CAPSULE | Freq: Two times a day (BID) | ORAL | Status: DC

## 2013-04-22 MED ORDER — CITALOPRAM HYDROBROMIDE 40 MG PO TABS: 40.0000 mg | ORAL_TABLET | Freq: Every day | ORAL | Status: DC

## 2013-04-22 MED ORDER — ZOLPIDEM TARTRATE 10 MG PO TABS: 10.0000 mg | ORAL_TABLET | Freq: Every evening | ORAL | Status: DC | PRN

## 2013-04-22 MED ORDER — CYCLOBENZAPRINE HCL 10 MG PO TABS: 10.0000 mg | ORAL_TABLET | Freq: Every day | ORAL | Status: DC

## 2013-04-22 MED ORDER — LOVASTATIN 20 MG PO TABS: ORAL_TABLET | ORAL | Status: DC

## 2013-04-22 MED ORDER — HYDROCHLOROTHIAZIDE 25 MG PO TABS: 25.0000 mg | ORAL_TABLET | Freq: Every morning | ORAL | Status: DC

## 2013-04-22 MED ORDER — ALLOPURINOL 300 MG PO TABS: 300.0000 mg | ORAL_TABLET | Freq: Every day | ORAL | Status: DC

## 2013-04-22 MED ORDER — BISOPROLOL-HYDROCHLOROTHIAZIDE 5-6.25 MG PO TABS: 1.0000 | ORAL_TABLET | Freq: Every morning | ORAL | Status: DC

## 2013-04-22 MED ORDER — COLCHICINE 0.6 MG PO TABS: 0.6000 mg | ORAL_TABLET | Freq: Two times a day (BID) | ORAL | Status: DC | PRN

## 2013-04-22 MED ORDER — LISINOPRIL 20 MG PO TABS: ORAL_TABLET | ORAL | Status: DC

## 2013-04-22 MED ORDER — DOXAZOSIN MESYLATE 2 MG PO TABS: 2.0000 mg | ORAL_TABLET | Freq: Every day | ORAL | Status: DC

## 2013-04-22 NOTE — Progress Notes (Signed)
OFFICE NOTE  04/22/2013  CC:  Chief Complaint  Patient presents with  . Medication Refill     HPI: Patient is a 62 y.o. Caucasian male who is here for 5 mo f/u HTN, hyperlipidemia, COPD. Doing good. Recent f/u with Dr. Isabel Caprice: said all was good, next f/u 6 mo.  Has been trying to be more active lately and has lost a little wt. Has been out of allopurinol and mevacor for 3-4 weeks due to running out of these meds: hectic life, taking care of elderly mother lately, etc. Gout flare in L about 6 wks ago, took 3-4 days of colchicine to get it to resolve--admits he ate a very high purine meal prior to this flare.  Overall, says his diffuse muscle achiness is better since he's been off allopurinol and mevacor. Suspects possible statin side effect.  Admits he also has arthralgias/arthritis due to his wt.  Opana is on his med list and he confirmed that he still takes this prn but there was nothing said about this med today--he did not say he needed any rx for any pain meds.  He has not followed up with rheumatology since his initial consult visit with Dr. Sigmund Hazel busy", etc.  Pertinent PMH:  Past Medical History  Diagnosis Date  . Osteoarthrosis, unspecified whether generalized or localized, unspecified site     DDD  . Hyperlipidemia   . Depression     ?bipolar dx by psychiatrist?  . Anxiety   . 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 ED (erectile dysfunction)   . Obesities, morbid   . 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 Diverticulosis 2006  . Seizure disorder 08-15-2011    Deja vu sensations: no w/u.  These stopped when neurontin 300mg  tid was started for a different reason.  . Gouty arthritis     Dr. Dierdre Forth  . Radiation     Pelvic--for prostate ca: 25 treatments/01/2011  . Status post chemotherapy     10/2010 thru 06/2011  . Coronary atherosclerosis of unspecified type of vessel, native or graft 2005    MI, s/p PCI  with stent (pt reports 20+ interventions in the past, most recent cat 6/08 showed patent stents).  Normal LV function.  Nuclear stress test NEG 8/09.  Marland Kitchen Hypertension   . Myocardial infarction     x 2 or 3  . COPD (chronic obstructive pulmonary disease)     on "study med"  . OSA on CPAP     not using cpap has mask  . Prostate cancer 09/2010    Localized, high risk disease (Dr. Kimbrough/Grapey):  s/p 5 wks external beam radiation + seed boost, as well as hormone blockade x 1 yr.  . Hematochezia 09/2012    Hyperplastic polyp, diverticulosis, and internal hemorrhoids found on colonoscopy 10/2012   Past surgical, social, and family history reviewed and no changes noted since last office visit.  MEDS:  Outpatient Prescriptions Prior to Visit  Medication Sig Dispense Refill  . albuterol (PROAIR HFA) 108 (90 BASE) MCG/ACT inhaler Inhale 2 puffs into the lungs every 4 (four) hours as needed.  3 Inhaler  1  . albuterol (PROVENTIL) (2.5 MG/3ML) 0.083% nebulizer solution Take 3 mLs (2.5 mg total) by nebulization every 4 (four) hours as needed.  225 mL  1  . aspirin 325 MG tablet Take 325 mg by mouth daily.        . bisoprolol-hydrochlorothiazide [facility])  5-6.25 MG per tablet Take 1 tablet by mouth every morning.      . citalopram (CELEXA) 40 MG tablet Take 40 mg by mouth at bedtime.      . colchicine 0.6 MG tablet Take 0.6 mg by mouth 2 (two) times daily as needed (gout flare ups).      . cyclobenzaprine (FLEXERIL) 10 MG tablet Take 1 tablet (10 mg total) by mouth daily.  90 tablet  1  . doxazosin (CARDURA) 2 MG tablet Take 1 tablet (2 mg total) by mouth at bedtime.  30 tablet  0  . Fluticasone Furoate-Vilanterol (BREO ELLIPTA) 100-25 MCG/INH AEPB Inhale 1 puff into the lungs every morning.      . furosemide (LASIX) 40 MG tablet Take 1 tablet (40 mg total) by mouth daily as needed.  90 tablet  1  . gabapentin (NEURONTIN) 300 MG capsule Take 300 mg by mouth 2 (two) times daily.      . hydrochlorothiazide  (HYDRODIURIL) 25 MG tablet Take 1 tablet (25 mg total) by mouth every morning.  30 tablet  0  . lisinopril (PRINIVIL,ZESTRIL) 20 MG tablet Take 40 mg by mouth every morning.      . niacin 500 MG tablet Take 1 tablet (500 mg total) by mouth 2 (two) times daily with a meal.  180 tablet  1  . oxymorphone (OPANA) 5 MG tablet Take 5 mg by mouth at bedtime as needed for pain.      Marland Kitchen zolpidem (AMBIEN) 10 MG tablet Take 1 tablet (10 mg total) by mouth at bedtime as needed for sleep.  90 tablet  1  . allopurinol (ZYLOPRIM) 300 MG tablet Take 1 tablet (300 mg total) by mouth daily.  30 tablet  6  . Potassium 99 MG TABS Take 1 tablet by mouth daily.         No facility-administered medications prior to visit.    PE: Blood pressure 131/83, pulse 80, temperature 97.9 F (36.6 C), temperature source Temporal, resp. rate 20, height 5' 10.5" (1.791 m), weight 301 lb (136.533 kg), SpO2 94.00%. Gen: Alert, well appearing, morbidly obese white male.  Patient is oriented to person, place, time, and situation. CV: RRR, no m/r/g.  S1 and S2 distant. LUNGS: CTA bilat, nonlabored resps, mildly diminished breath sounds diffusely. EXT: no clubbing, cyanosis, or edema.  Joints: no warmth, swelling, erythema, or tenderness  LABS:  Lab Results  Component Value Date   CHOL 199 04/19/2013   HDL 33.20* 04/19/2013   LDLCALC 135* 04/19/2013   LDLDIRECT 130.7 12/02/2012   TRIG 156.0* 04/19/2013   CHOLHDL 6 04/19/2013     Chemistry      Component Value Date/Time   NA 135 04/05/2013 0730   K 3.8 04/05/2013 0730   CL 97 04/05/2013 0730   CO2 26 04/05/2013 0730   BUN 10 04/05/2013 0730   CREATININE 0.80 04/05/2013 0730   CREATININE 0.86 07/22/2012 1736      Component Value Date/Time   CALCIUM 8.8 04/05/2013 0730   ALKPHOS 62 12/02/2012 0916   AST 22 12/02/2012 0916   ALT 25 12/02/2012 0916   BILITOT 0.7 12/02/2012 0916     Uric acid level 04/19/13 was 10.3.  IMPRESSION AND PLAN:  1) HTN: The current medical regimen is  effective;  continue present plan and medications.  2) COPD: The current medical regimen is effective;  continue present plan and medications.  3) Hyperlipidemia: some med noncompliance lately--also some suspected statin side effects/myagias.  He'll wait a couple of weeks before restarting this med, and when he restarts it he'll do 20mg  dosing for a while instead of 40mg .  Increase to 40mg  dosing if no myalgias on 20mg .  He has been taking niacin 500mg  OTC bid and he will continue taking this.  4) Gout: noncompliance with allopurinol recently.  I recommended he restart at 1/2 of 300 mg tab qd x 7d and then increase to 300 mg qd. Also start colcrys 0.6mg  bid for at least 3 weeks while getting back on his allopurinol.  Call if problems with this.  5) Hx of prostate ca: s/p chemo and radiation; recent f/u with urologist was good report.  F/u with urologist in 50mo.  6) Hematochezia: resolved.  Colonoscopy showed one hyperplastic polyp, some diverticulosis, and internal hemorrhoids. Next screening colonoscopy 10 yrs.  7) Depression with anxiety: doing very well on citalopram.  Insomnia doing well on prn zolpidem (he uses this MOST nights but not all).  8) Chronic diffuse musculoskeletal pain: multifactorial--obesity, osteoarthritis, chronic gouty arthritis, possibly statin-induced myalgias.   Seems to be doing ok on flexeril hs, a rare pain pill (no new rx for narcotic was given today, but flexeril was rx'd).    9)CAD: asymptomatic at this time.  He knows it is about time for a check in with his cardiologist, Dr. Tenny Craw.  Flu vaccine IM today.  An After Visit Summary was printed and given to the patient.  FOLLOW UP: 50mo

## 2013-05-18 ENCOUNTER — Other Ambulatory Visit: Payer: Self-pay | Admitting: Family Medicine

## 2013-05-18 MED ORDER — COLCHICINE 0.6 MG PO TABS: 0.6000 mg | ORAL_TABLET | Freq: Two times a day (BID) | ORAL | Status: DC | PRN

## 2013-05-18 NOTE — Telephone Encounter (Signed)
Opened in error

## 2013-05-18 NOTE — Telephone Encounter (Signed)
Patient called and said colchicine was sent in BID #30.  I sent in new medication refill for BID #60.

## 2013-06-14 ENCOUNTER — Telehealth: Payer: Self-pay | Admitting: Family Medicine

## 2013-06-14 NOTE — Telephone Encounter (Signed)
Please contact Dr. Bevelyn Buckles office. Patient is there to get a tooth extracted. They want to know if the patient should discontinue his daily aspirin 325 MG regime? Please ask for Autumn

## 2013-06-14 NOTE — Telephone Encounter (Signed)
Spoke to Dr. Milinda Cave, he said to hold aspirin 325mg  for 3 days.

## 2013-07-04 ENCOUNTER — Ambulatory Visit (INDEPENDENT_AMBULATORY_CARE_PROVIDER_SITE_OTHER): Payer: 59 | Admitting: Family Medicine

## 2013-07-04 ENCOUNTER — Encounter: Payer: Self-pay | Admitting: Family Medicine

## 2013-07-04 VITALS — BP 169/94 | HR 80 | Temp 97.6°F | Ht 70.5 in | Wt 304.0 lb

## 2013-07-04 DIAGNOSIS — J441 Chronic obstructive pulmonary disease with (acute) exacerbation: Secondary | ICD-10-CM

## 2013-07-04 DIAGNOSIS — F17200 Nicotine dependence, unspecified, uncomplicated: Secondary | ICD-10-CM

## 2013-07-04 DIAGNOSIS — R0902 Hypoxemia: Secondary | ICD-10-CM

## 2013-07-04 DIAGNOSIS — G4733 Obstructive sleep apnea (adult) (pediatric): Secondary | ICD-10-CM

## 2013-07-04 MED ORDER — ALBUTEROL SULFATE (2.5 MG/3ML) 0.083% IN NEBU
2.5000 mg | INHALATION_SOLUTION | Freq: Once | RESPIRATORY_TRACT | Status: AC
  Administered 2013-07-04: 2.5 mg via RESPIRATORY_TRACT

## 2013-07-04 MED ORDER — IPRATROPIUM BROMIDE 0.02 % IN SOLN
0.5000 mg | Freq: Once | RESPIRATORY_TRACT | Status: AC
  Administered 2013-07-04: 0.5 mg via RESPIRATORY_TRACT

## 2013-07-04 MED ORDER — PREDNISONE 20 MG PO TABS: ORAL_TABLET | ORAL | Status: DC

## 2013-07-04 MED ORDER — AZITHROMYCIN 250 MG PO TABS: ORAL_TABLET | ORAL | Status: DC

## 2013-07-04 NOTE — Progress Notes (Signed)
Pre-visit discussion using our clinic review tool. No additional management support is needed unless otherwise documented below in the visit note.  

## 2013-07-04 NOTE — Assessment & Plan Note (Signed)
Alb/atr neb in office today. Prednisone taper Azith x 5d

## 2013-07-04 NOTE — Progress Notes (Addendum)
OFFICE NOTE  07/04/2013  CC:  Chief Complaint  Patient presents with  . Shortness of Breath     HPI: Patient is a 62 y.o. Caucasian male who is here for cough, SOB/DOE worsened, increased sputum production-onset about 3 weeks ago and gradually progressing. +Smoker--but not the last 3 wks while ill.  Wants to quit. Using albuterol neb q6h and it helps less than usual.  No fever.  +Fatigue, poor sleep lately.  No CP, no dizziness, no HAs, no abd pain.   Pertinent PMH:  Past Medical History  Diagnosis Date  . Osteoarthrosis, unspecified whether generalized or localized, unspecified site     DDD  . Hyperlipidemia   . Depression     ?bipolar dx by psychiatrist?  . Anxiety   . 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 ED (erectile dysfunction)   . Obesities, morbid   . 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 Diverticulosis 2006  . Seizure disorder 08-15-2011    Deja vu sensations: no w/u.  These stopped when neurontin 300mg  tid was started for a different reason.  . Gouty arthritis     Dr. Dierdre Forth  . Radiation     Pelvic--for prostate ca: 25 treatments/01/2011  . Status post chemotherapy     10/2010 thru 06/2011  . Coronary atherosclerosis of unspecified type of vessel, native or graft 2005    MI, s/p PCI with stent (pt reports 20+ interventions in the past, most recent cat 6/08 showed patent stents).  Normal LV function.  Nuclear stress test NEG 8/09.  Marland Kitchen Hypertension   . Myocardial infarction     x 2 or 3  . COPD (chronic obstructive pulmonary disease)     on "study med"  . OSA on CPAP     not using cpap has mask  . Prostate cancer 09/2010    Localized, high risk disease (Dr. Kimbrough/Grapey):  s/p 5 wks external beam radiation + seed boost, as well as hormone blockade x 1 yr.  . Hematochezia 09/2012    Hyperplastic polyp, diverticulosis, and internal hemorrhoids found on colonoscopy 10/2012   Past surgical, social, and  family history reviewed and no changes noted since last office visit.  MEDS:  Outpatient Prescriptions Prior to Visit  Medication Sig Dispense Refill  . albuterol (PROAIR HFA) 108 (90 BASE) MCG/ACT inhaler Inhale 2 puffs into the lungs every 4 (four) hours as needed.  3 Inhaler  1  . albuterol (PROVENTIL) (2.5 MG/3ML) 0.083% nebulizer solution Take 3 mLs (2.5 mg total) by nebulization every 4 (four) hours as needed.  225 mL  1  . allopurinol (ZYLOPRIM) 300 MG tablet Take 1 tablet (300 mg total) by mouth daily.  30 tablet  7  . aspirin 325 MG tablet Take 325 mg by mouth daily.        . bisoprolol-hydrochlorothiazide (ZIAC) 5-6.25 MG per tablet Take 1 tablet by mouth every morning.  30 tablet  7  . citalopram (CELEXA) 40 MG tablet Take 1 tablet (40 mg total) by mouth at bedtime.  30 tablet  7  . colchicine 0.6 MG tablet Take 1 tablet (0.6 mg total) by mouth 2 (two) times daily as needed (gout flare ups).  60 tablet  6  . cyclobenzaprine (FLEXERIL) 10 MG tablet Take 1 tablet (10 mg total) by mouth daily.  90 tablet  1  . doxazosin (CARDURA) 2 MG tablet Take 1 tablet (2 mg total)  by mouth at bedtime.  30 tablet  7  . Fluticasone Furoate-Vilanterol (BREO ELLIPTA) 100-25 MCG/INH AEPB Inhale 1 puff into the lungs every morning.      . furosemide (LASIX) 40 MG tablet Take 1 tablet (40 mg total) by mouth daily as needed.  90 tablet  1  . gabapentin (NEURONTIN) 300 MG capsule Take 1 capsule (300 mg total) by mouth 2 (two) times daily.  60 capsule  7  . hydrochlorothiazide (HYDRODIURIL) 25 MG tablet Take 1 tablet (25 mg total) by mouth every morning.  30 tablet  7  . lisinopril (PRINIVIL,ZESTRIL) 20 MG tablet 1 tab po bid  60 tablet  7  . lovastatin (MEVACOR) 20 MG tablet 1 tab po bid  60 tablet  7  . MOVIPREP 100 G SOLR       . niacin 500 MG tablet Take 1 tablet (500 mg total) by mouth 2 (two) times daily with a meal.  180 tablet  1  . oxymorphone (OPANA) 5 MG tablet Take 5 mg by mouth at bedtime as  needed for pain.      Marland Kitchen Potassium 99 MG TABS Take 1 tablet by mouth daily.        Marland Kitchen zolpidem (AMBIEN) 10 MG tablet Take 1 tablet (10 mg total) by mouth at bedtime as needed for sleep.  90 tablet  1   No facility-administered medications prior to visit.    PE: Blood pressure 169/94, pulse 71, temperature 97.6 F (36.4 C), temperature source Temporal, height 5' 10.5" (1.791 m), weight 304 lb (137.893 kg), SpO2 87.00%.  O2 sat on RA abut 5 min after albut/atrov neb was 96% VS: noted--normal. Gen: alert, NAD, NONTOXIC APPEARING. HEENT: eyes without injection, drainage, or swelling.  Ears: EACs clear, TMs with normal light reflex and landmarks.  Nose: Clear rhinorrhea, with some dried, crusty exudate adherent to mildly injected mucosa.  No purulent d/c.  No paranasal sinus TTP.  No facial swelling.  Throat and mouth without focal lesion.  No pharyngial swelling, erythema, or exudate.   Neck: supple, no LAD.   LUNGS: CTA bilat except faint end-exp wheezing with prolonged expiratory phase.  Resps mildly labored.   CV: RRR, no m/r/g. EXT: no cyanosis SKIN: no rash    IMPRESSION AND PLAN:  1) COPD exacerbation: improved after alb/atr neb in office today. Prednisone 60 mg qd x 5d, then 40mg  qd x 5d, then 20mg  qd x 5d. Azith x 5d. Continue q4h albut 2.5mg  nebs scheduled x 24h then change to q4h prn.   F/u in office in 5d, earlier if not improving.   Signs/symptoms to call or return for were reviewed and pt expressed understanding.Marland Kitchen  2) OSA: pt requests orders to restart CPAP/update supplies.  Will also try to arrange for overnight oximetry testing.  3) Ongoing tobacco abuse: pt is at the point of wanting to try to quit again.  This is a perfect time to try.  An After Visit Summary was printed and given to the patient.  FOLLOW UP:  7d

## 2013-07-05 ENCOUNTER — Telehealth: Payer: Self-pay | Admitting: Family Medicine

## 2013-07-05 MED ORDER — ACYCLOVIR 400 MG PO TABS: ORAL_TABLET | ORAL | Status: DC

## 2013-07-05 NOTE — Telephone Encounter (Signed)
Patient called requesting acyclovir capsules to be refilled for fever blisters.  Patient states he forgot to ask you to fill them yesterday.  He thinks he takes 300mg .  Please advise.

## 2013-07-05 NOTE — Telephone Encounter (Signed)
Acyclovir rx eRx'd.

## 2013-07-08 ENCOUNTER — Telehealth: Payer: Self-pay | Admitting: Family Medicine

## 2013-07-08 NOTE — Telephone Encounter (Signed)
[facility] is out of network with [employer]. Tia recommends [facility], they are in-network for [facility].

## 2013-07-11 ENCOUNTER — Ambulatory Visit: Payer: 59 | Admitting: Family Medicine

## 2013-07-18 ENCOUNTER — Encounter: Payer: Self-pay | Admitting: Family Medicine

## 2013-07-18 ENCOUNTER — Telehealth: Payer: Self-pay | Admitting: Family Medicine

## 2013-07-18 ENCOUNTER — Ambulatory Visit (INDEPENDENT_AMBULATORY_CARE_PROVIDER_SITE_OTHER): Payer: Medicare Other | Admitting: Family Medicine

## 2013-07-18 VITALS — BP 153/81 | HR 76 | Temp 98.1°F | Resp 22 | Ht 70.5 in | Wt 305.0 lb

## 2013-07-18 DIAGNOSIS — E782 Mixed hyperlipidemia: Secondary | ICD-10-CM

## 2013-07-18 DIAGNOSIS — J441 Chronic obstructive pulmonary disease with (acute) exacerbation: Secondary | ICD-10-CM

## 2013-07-18 DIAGNOSIS — I10 Essential (primary) hypertension: Secondary | ICD-10-CM | POA: Diagnosis not present

## 2013-07-18 DIAGNOSIS — E785 Hyperlipidemia, unspecified: Secondary | ICD-10-CM | POA: Diagnosis not present

## 2013-07-18 MED ORDER — ALBUTEROL SULFATE HFA 108 (90 BASE) MCG/ACT IN AERS: 2.0000 | INHALATION_SPRAY | RESPIRATORY_TRACT | Status: DC | PRN

## 2013-07-18 MED ORDER — ACYCLOVIR 400 MG PO TABS: ORAL_TABLET | ORAL | Status: DC

## 2013-07-18 MED ORDER — GABAPENTIN 300 MG PO CAPS: 300.0000 mg | ORAL_CAPSULE | Freq: Two times a day (BID) | ORAL | Status: DC

## 2013-07-18 MED ORDER — DOXAZOSIN MESYLATE 2 MG PO TABS: 2.0000 mg | ORAL_TABLET | Freq: Every day | ORAL | Status: DC

## 2013-07-18 MED ORDER — ALLOPURINOL 300 MG PO TABS: 300.0000 mg | ORAL_TABLET | Freq: Every day | ORAL | Status: DC

## 2013-07-18 MED ORDER — CYCLOBENZAPRINE HCL 10 MG PO TABS: 10.0000 mg | ORAL_TABLET | Freq: Every day | ORAL | Status: DC

## 2013-07-18 MED ORDER — BISOPROLOL-HYDROCHLOROTHIAZIDE 5-6.25 MG PO TABS: 1.0000 | ORAL_TABLET | Freq: Every morning | ORAL | Status: DC

## 2013-07-18 MED ORDER — FLUTICASONE FUROATE-VILANTEROL 100-25 MCG/INH IN AEPB: 1.0000 | INHALATION_SPRAY | Freq: Every morning | RESPIRATORY_TRACT | Status: DC

## 2013-07-18 MED ORDER — CITALOPRAM HYDROBROMIDE 40 MG PO TABS: 40.0000 mg | ORAL_TABLET | Freq: Every day | ORAL | Status: DC

## 2013-07-18 MED ORDER — ALBUTEROL SULFATE (2.5 MG/3ML) 0.083% IN NEBU: 2.5000 mg | INHALATION_SOLUTION | RESPIRATORY_TRACT | Status: DC | PRN

## 2013-07-18 MED ORDER — LISINOPRIL 20 MG PO TABS: ORAL_TABLET | ORAL | Status: DC

## 2013-07-18 MED ORDER — HYDROCHLOROTHIAZIDE 25 MG PO TABS: 25.0000 mg | ORAL_TABLET | Freq: Every morning | ORAL | Status: DC

## 2013-07-18 MED ORDER — FUROSEMIDE 40 MG PO TABS: 40.0000 mg | ORAL_TABLET | Freq: Every day | ORAL | Status: DC | PRN

## 2013-07-18 NOTE — Progress Notes (Signed)
Pre visit review using our clinic review tool, if applicable. No additional management support is needed unless otherwise documented below in the visit note. 

## 2013-07-18 NOTE — Telephone Encounter (Signed)
Letter printed.  [place] fax.

## 2013-07-18 NOTE — Telephone Encounter (Signed)
Patient is requesting jury excuse. Please fax letter with an explanation of why (COPD) he can not serve to the attention of [employer]. Fax to [phone] with jury date 07/25/12 and jury # [phone].

## 2013-07-18 NOTE — Telephone Encounter (Signed)
Please advise 

## 2013-07-18 NOTE — Progress Notes (Signed)
OFFICE NOTE  07/18/2013  CC:  Chief Complaint  Patient presents with  . Follow-up     HPI: Patient is a 63 y.o. Caucasian male who is here for f/u recent COPD exacerbation.   He feels much improved but still not back to baseline.  Still gets SOB with walking across the room. He is finishing up with a COPD study (Breo-ellipta):  He is in the midst of quitting smoking.  He does not want any med help with this.  Pertinent PMH:  Past Medical History  Diagnosis Date  . Osteoarthrosis, unspecified whether generalized or localized, unspecified site     DDD  . Hyperlipidemia   . Depression     ?bipolar dx by psychiatrist?  . Anxiety   . 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 ED (erectile dysfunction)   . Obesities, morbid   . 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 Diverticulosis 2006  . Seizure disorder 08-15-2011    Deja vu sensations: no w/u.  These stopped when neurontin 300mg  tid was started for a different reason.  . Gouty arthritis     Dr. Amil Amen  . Radiation     Pelvic--for prostate ca: 25 treatments/01/2011  . Status post chemotherapy     10/2010 thru 06/2011  . Coronary atherosclerosis of unspecified type of vessel, native or graft 2005    MI, s/p PCI with stent (pt reports 20+ interventions in the past, most recent cat 6/08 showed patent stents).  Normal LV function.  Nuclear stress test NEG 8/09.  Marland Kitchen Hypertension   . Myocardial infarction     x 2 or 3  . COPD (chronic obstructive pulmonary disease)     on "study med"  . OSA on CPAP     not using cpap has mask  . Prostate cancer 09/2010    Localized, high risk disease (Dr. Kimbrough/Grapey):  s/p 5 wks external beam radiation + seed boost, as well as hormone blockade x 1 yr.  . Hematochezia 09/2012    Hyperplastic polyp, diverticulosis, and internal hemorrhoids found on colonoscopy 10/2012    MEDS:  Outpatient Prescriptions Prior to Visit  Medication Sig Dispense  Refill  . acyclovir (ZOVIRAX) 400 MG tablet 1 tab po tid x 5d for acute episode of fever blisters  15 tablet  3  . albuterol (PROAIR HFA) 108 (90 BASE) MCG/ACT inhaler Inhale 2 puffs into the lungs every 4 (four) hours as needed.  3 Inhaler  1  . albuterol (PROVENTIL) (2.5 MG/3ML) 0.083% nebulizer solution Take 3 mLs (2.5 mg total) by nebulization every 4 (four) hours as needed.  225 mL  1  . allopurinol (ZYLOPRIM) 300 MG tablet Take 1 tablet (300 mg total) by mouth daily.  30 tablet  7  . aspirin 325 MG tablet Take 325 mg by mouth daily.        . bisoprolol-hydrochlorothiazide (ZIAC) 5-6.25 MG per tablet Take 1 tablet by mouth every morning.  30 tablet  7  . citalopram (CELEXA) 40 MG tablet Take 1 tablet (40 mg total) by mouth at bedtime.  30 tablet  7  . cyclobenzaprine (FLEXERIL) 10 MG tablet Take 1 tablet (10 mg total) by mouth daily.  90 tablet  1  . doxazosin (CARDURA) 2 MG tablet Take 1 tablet (2 mg total) by mouth at bedtime.  30 tablet  7  . Fluticasone Furoate-Vilanterol (BREO ELLIPTA) 100-25 MCG/INH AEPB Inhale 1 puff into  the lungs every morning.      . furosemide (LASIX) 40 MG tablet Take 1 tablet (40 mg total) by mouth daily as needed.  90 tablet  1  . gabapentin (NEURONTIN) 300 MG capsule Take 1 capsule (300 mg total) by mouth 2 (two) times daily.  60 capsule  7  . hydrochlorothiazide (HYDRODIURIL) 25 MG tablet Take 1 tablet (25 mg total) by mouth every morning.  30 tablet  7  . lisinopril (PRINIVIL,ZESTRIL) 20 MG tablet 1 tab po bid  60 tablet  7  . niacin 500 MG tablet Take 1 tablet (500 mg total) by mouth 2 (two) times daily with a meal.  180 tablet  1  . oxymorphone (OPANA) 5 MG tablet Take 5 mg by mouth at bedtime as needed for pain.      Marland Kitchen zolpidem (AMBIEN) 10 MG tablet Take 1 tablet (10 mg total) by mouth at bedtime as needed for sleep.  90 tablet  1  . colchicine 0.6 MG tablet Take 1 tablet (0.6 mg total) by mouth 2 (two) times daily as needed (gout flare ups).  60 tablet  6   . lovastatin (MEVACOR) 20 MG tablet 1 tab po bid  60 tablet  7  . MOVIPREP 100 G SOLR       . Potassium 99 MG TABS Take 1 tablet by mouth daily.        Marland Kitchen azithromycin (ZITHROMAX) 250 MG tablet 2 tabs po qd x 1d, then 1 tab po qd x 4d  6 each  0  . predniSONE (DELTASONE) 20 MG tablet 3 tabs po qd x 5d, then 2 tabs po qd x 5d, then 1 tab po qd x 5d  30 tablet  0   No facility-administered medications prior to visit.    PE: Blood pressure 153/81, pulse 76, temperature 98.1 F (36.7 C), temperature source Oral, resp. rate 22, height 5' 10.5" (1.791 m), weight 305 lb (138.347 kg), SpO2 93.00%. Gen: Alert, well appearing, morbidly obese-appearing.  Patient is oriented to person, place, time, and situation. CV: RRR, distant S1 and S2, no audible m/r/g. LUNGS: clear but with diffusely diminished BS, nonlabored resps.   IMPRESSION AND PLAN:  Acute exacerbation of chronic obstructive pulmonary disease (COPD) Resolving. Continue chronic meds + prn albuterol.  HYPERTENSION The current medical regimen is effective;  continue present plan and medications.   MIXED HYPERLIPIDEMIA The current medical regimen is effective;  continue present plan and medications.   4) Tobacco cessation: in the midst of quitting.  Pt declined medication help at this time.  An After Visit Summary was printed and given to the patient.  Patient requested I write a letter to try to get him excused from jury duty. I agreed to do this b/c he has significant DOE and DJD that would place an unreasonable burden on him in order to fulfill this duty.  FOLLOW UP: 4-6 mo

## 2013-07-18 NOTE — Telephone Encounter (Signed)
Lisa-did you send a new referral?

## 2013-07-19 NOTE — Telephone Encounter (Signed)
Faxed

## 2013-07-21 NOTE — Telephone Encounter (Signed)
[facility] referral faxed

## 2013-07-22 ENCOUNTER — Telehealth: Payer: Self-pay | Admitting: Family Medicine

## 2013-07-22 MED ORDER — TERAZOSIN HCL 2 MG PO CAPS: 2.0000 mg | ORAL_CAPSULE | Freq: Every day | ORAL | Status: DC

## 2013-07-22 NOTE — Telephone Encounter (Signed)
Patient states that the doxazosin is expensive for him and he wondered if you thought terazosin would be as effective?  Patient states that it is on the $4 generic list at [facility].  If the change is appropriate patient would like a 90 day supply sent to [facility] in [place].  Please advise.

## 2013-07-22 NOTE — Telephone Encounter (Addendum)
Yes, this switch is fine. I'll send eRx for terazosinas per pt request.

## 2013-08-01 ENCOUNTER — Telehealth: Payer: Self-pay | Admitting: Family Medicine

## 2013-08-01 NOTE — Telephone Encounter (Signed)
Relevant patient education assigned to patient using Emmi. ° °

## 2013-08-07 ENCOUNTER — Encounter: Payer: Self-pay | Admitting: Family Medicine

## 2013-08-07 NOTE — Assessment & Plan Note (Signed)
The current medical regimen is effective;  continue present plan and medications.  

## 2013-08-07 NOTE — Assessment & Plan Note (Signed)
Resolving. Continue chronic meds + prn albuterol.

## 2013-08-15 ENCOUNTER — Telehealth: Payer: Self-pay | Admitting: Family Medicine

## 2013-08-15 NOTE — Telephone Encounter (Signed)
Relevant patient education assigned to patient using Emmi. ° °

## 2013-10-20 ENCOUNTER — Ambulatory Visit: Payer: Medicare Other | Admitting: Family Medicine

## 2014-01-06 ENCOUNTER — Telehealth: Payer: Self-pay | Admitting: Family Medicine

## 2014-01-06 ENCOUNTER — Ambulatory Visit: Payer: Medicare Other | Admitting: Family Medicine

## 2014-01-06 NOTE — Telephone Encounter (Signed)
Patient called to reschedule his appointment because he forgot it was this morning, he is needing refills on gabapentin and citalopram send to [facility] in [place]

## 2014-01-07 ENCOUNTER — Other Ambulatory Visit: Payer: Self-pay | Admitting: Family Medicine

## 2014-01-07 MED ORDER — CITALOPRAM HYDROBROMIDE 40 MG PO TABS
40.0000 mg | ORAL_TABLET | Freq: Every day | ORAL | Status: DC
Start: 2014-01-07 — End: 2014-04-19

## 2014-01-07 MED ORDER — GABAPENTIN 300 MG PO CAPS: 300.0000 mg | ORAL_CAPSULE | Freq: Two times a day (BID) | ORAL | Status: DC

## 2014-01-07 NOTE — Telephone Encounter (Signed)
Rxs done. 

## 2014-01-09 ENCOUNTER — Ambulatory Visit: Payer: Medicare Other | Admitting: Family Medicine

## 2014-01-10 DIAGNOSIS — C61 Malignant neoplasm of prostate: Secondary | ICD-10-CM | POA: Diagnosis not present

## 2014-01-11 ENCOUNTER — Ambulatory Visit (INDEPENDENT_AMBULATORY_CARE_PROVIDER_SITE_OTHER): Payer: Medicare Other | Admitting: Family Medicine

## 2014-01-11 ENCOUNTER — Telehealth: Payer: Self-pay | Admitting: Family Medicine

## 2014-01-11 ENCOUNTER — Encounter: Payer: Self-pay | Admitting: Family Medicine

## 2014-01-11 VITALS — BP 139/82 | HR 73 | Temp 97.4°F | Resp 18 | Ht 70.5 in | Wt 308.0 lb

## 2014-01-11 DIAGNOSIS — E785 Hyperlipidemia, unspecified: Secondary | ICD-10-CM | POA: Diagnosis not present

## 2014-01-11 DIAGNOSIS — C61 Malignant neoplasm of prostate: Secondary | ICD-10-CM

## 2014-01-11 DIAGNOSIS — I10 Essential (primary) hypertension: Secondary | ICD-10-CM | POA: Diagnosis not present

## 2014-01-11 DIAGNOSIS — F17200 Nicotine dependence, unspecified, uncomplicated: Secondary | ICD-10-CM | POA: Diagnosis not present

## 2014-01-11 DIAGNOSIS — J441 Chronic obstructive pulmonary disease with (acute) exacerbation: Secondary | ICD-10-CM | POA: Diagnosis not present

## 2014-01-11 DIAGNOSIS — Z79899 Other long term (current) drug therapy: Secondary | ICD-10-CM | POA: Diagnosis not present

## 2014-01-11 DIAGNOSIS — G894 Chronic pain syndrome: Secondary | ICD-10-CM

## 2014-01-11 LAB — COMPREHENSIVE METABOLIC PANEL
ALK PHOS: 73 U/L (ref 39–117)
ALT: 18 U/L (ref 0–53)
AST: 20 U/L (ref 0–37)
Albumin: 3.9 g/dL (ref 3.5–5.2)
BILIRUBIN TOTAL: 0.6 mg/dL (ref 0.2–1.2)
BUN: 19 mg/dL (ref 6–23)
CO2: 30 mEq/L (ref 19–32)
Calcium: 9 mg/dL (ref 8.4–10.5)
Chloride: 100 mEq/L (ref 96–112)
Creatinine, Ser: 0.8 mg/dL (ref 0.4–1.5)
GFR: 99.05 mL/min (ref >60.00)
Glucose, Bld: 103 mg/dL — ABNORMAL HIGH (ref 70–99)
Potassium: 4 mEq/L (ref 3.5–5.1)
SODIUM: 138 meq/L (ref 135–145)
Total Protein: 6.6 g/dL (ref 6.0–8.3)

## 2014-01-11 MED ORDER — OXYMORPHONE HCL 5 MG PO TABS: ORAL_TABLET | ORAL | Status: DC

## 2014-01-11 MED ORDER — ZOLPIDEM TARTRATE 10 MG PO TABS: 10.0000 mg | ORAL_TABLET | Freq: Every evening | ORAL | Status: DC | PRN

## 2014-01-11 MED ORDER — UMECLIDINIUM-VILANTEROL 62.5-25 MCG/INH IN AEPB
INHALATION_SPRAY | RESPIRATORY_TRACT | Status: DC
Start: 2014-01-11 — End: 2014-04-19

## 2014-01-11 MED ORDER — PREDNISONE 20 MG PO TABS: ORAL_TABLET | ORAL | Status: DC

## 2014-01-11 NOTE — Telephone Encounter (Signed)
Patient calling back in requesting refills on all of his medications for 90 day supply with 2 refills, he states he is out of them all

## 2014-01-11 NOTE — Telephone Encounter (Signed)
Advised patient to contact his pharmacy so they could request exactly what he needed.

## 2014-01-11 NOTE — Progress Notes (Signed)
Pre visit review using our clinic review tool, if applicable. No additional management support is needed unless otherwise documented below in the visit note. 

## 2014-01-11 NOTE — Progress Notes (Signed)
OFFICE NOTE  01/11/2014  CC:  Chief Complaint  Patient presents with  . Follow-up  . Medication Refill   HPI: Patient is a 63 y.o. Caucasian male who is here accompanied by his wife today for 6 mo f/u COPD, HTN, hyperlipidemia, gout. He stopped his lovastatin b/c he felt like it was making his myalgia/arthralgias worse--he feels better just taking niacin. He quit smoking briefly since last visit but he quickly restarted.   Has been out of his breo-ellipta for about 1 mo, says he wants to try the new drug called Anoro b/c he wants to avoid inhaled steroids "for a while".   No home bp monitoring.  Has been compliant with allopurinol and has been doing well regarding his gout. Has been taking 400mg  ibuprofen once a day 440mg  naproxen qhs.  He has not gone back to see the rheumatologist. Has not had any narcotics since I last rx'd him opana  Denies dizziness, HA, CP. Has had a "flare" of COPD the last couple weeks--coincidental with running out of his breo-ellipta.  No fevers.  Intermittently has som increased SOB and wheezing and some cough.  Some URI/PND sx's as well.  No malaise.  No abd pain, no n/v/d.   Pertinent PMH:  Past medical, surgical, social, and family history reviewed and no changes are noted since last office visit.  MEDS:  Outpatient Prescriptions Prior to Visit  Medication Sig Dispense Refill  . acyclovir (ZOVIRAX) 400 MG tablet 1 tab po tid x 5d for acute episode of fever blisters  90 tablet  1  . albuterol (PROAIR HFA) 108 (90 BASE) MCG/ACT inhaler Inhale 2 puffs into the lungs every 4 (four) hours as needed.  3 Inhaler  1  . albuterol (PROVENTIL) (2.5 MG/3ML) 0.083% nebulizer solution Take 3 mLs (2.5 mg total) by nebulization every 4 (four) hours as needed.  225 mL  1  . allopurinol (ZYLOPRIM) 300 MG tablet Take 1 tablet (300 mg total) by mouth daily.  90 tablet  1  . aspirin 325 MG tablet Take 325 mg by mouth daily.        . bisoprolol-hydrochlorothiazide (ZIAC)  5-6.25 MG per tablet Take 1 tablet by mouth every morning.  90 tablet  1  . citalopram (CELEXA) 40 MG tablet Take 1 tablet (40 mg total) by mouth at bedtime.  90 tablet  1  . cyclobenzaprine (FLEXERIL) 10 MG tablet Take 1 tablet (10 mg total) by mouth daily.  90 tablet  1  . Fluticasone Furoate-Vilanterol (BREO ELLIPTA) 100-25 MCG/INH AEPB Inhale 1 puff into the lungs every morning.  90 each  1  . furosemide (LASIX) 40 MG tablet Take 1 tablet (40 mg total) by mouth daily as needed.  90 tablet  1  . gabapentin (NEURONTIN) 300 MG capsule Take 1 capsule (300 mg total) by mouth 2 (two) times daily.  180 capsule  1  . hydrochlorothiazide (HYDRODIURIL) 25 MG tablet Take 1 tablet (25 mg total) by mouth every morning.  90 tablet  1  . lisinopril (PRINIVIL,ZESTRIL) 20 MG tablet 1 tab po bid  180 tablet  1  . niacin 500 MG tablet Take 1 tablet (500 mg total) by mouth 2 (two) times daily with a meal.  180 tablet  1  . oxymorphone (OPANA) 5 MG tablet Take 5 mg by mouth at bedtime as needed for pain.      Marland Kitchen terazosin (HYTRIN) 2 MG capsule Take 1 capsule (2 mg total) by mouth at bedtime.  90 capsule  4  . zolpidem (AMBIEN) 10 MG tablet Take 1 tablet (10 mg total) by mouth at bedtime as needed for sleep.  90 tablet  1  . colchicine 0.6 MG tablet Take 1 tablet (0.6 mg total) by mouth 2 (two) times daily as needed (gout flare ups).  60 tablet  6  . Potassium 99 MG TABS Take 1 tablet by mouth daily.        Marland Kitchen MOVIPREP 100 G SOLR        No facility-administered medications prior to visit.    PE: Blood pressure 139/82, pulse 73, temperature 97.4 F (36.3 C), temperature source Temporal, resp. rate 18, height 5' 10.5" (1.791 m), weight 308 lb (139.708 kg), SpO2 93.00%. Gen: Alert, well appearing, obese-appearing WM in NAD.  Patient is oriented to person, place, time, and situation. FGH:WEXH: no injection, icteris, swelling, or exudate.  EOMI, PERRLA. Mouth: lips without lesion/swelling.  Oral mucosa pink and  moist. Oropharynx without erythema, exudate, or swelling.  CV: RRR LUNGS: clear on inspiration, but diffuse exp wheezing with prolonged exp phase was noted, nonlabored resps, aeration is decent. ABD: soft, rotund EXT: no clubbing or cyanosis.  Trace bilat pitting edema in pretibial regions.   IMPRESSION AND PLAN:  1) COPD, poor control vs mild acute flare. Ongoing tobacco abuse, encouraged to quit again today. Prednisone 60mg  qd x 4d, 40mg  qd x 4d, then 20mg  qd x 4d. Started Anoro (anticholinergic + long acting beta agonist) inhaler 1 puff once daily per pt request. Continue albuterol q4h prn.  2) HTN;The current medical regimen is effective;  continue present plan and medications. Lytes/cr today.  3) Hyperlipidemia: pt prefers to stay off statin and continue with niacin only at this time. We can repeat FLP in 74mo when he follows up next.  4) Chronic pain syndrome: Chronic diffuse musculoskeletal pain: multifactorial--obesity, osteoarthritis, chronic gouty arthritis.  He does not want to continue any f/u with rheum (Dr. Amil Amen).  To try to avoid overuse of NSAIDs I will go ahead and refill narcotic pain med for him, Opana 5mg , 1-2 tabs po bid prn, #60, no RF. Controlled substance contract reviewed with patient today.  Patient signed this and it will be placed in the chart.   UDS done today, patient should have no controlled substance rx's or illicit substances in urine at this time. (He has been out of his Lorrin Mais, so I RF'd this today).  5) Hx of prostate cancer: he had f/u with Dr. [name] this week and apparently his PSA was up some---"about 2" per pt. I have no record or further details.  He says he was told by Dr. Cy Blamer office to keep 110mo f/u and repeat PSA would be done at that time.  He and his wife are wondering if I agree with this, etc. I told him I would get the PSA records from Urology so I have the facts, but for now I would plan on keeping a 6 mo f/u with them and we will  have him back here in 3 mo for a PSA check in the meantime.  6) Gout: doing well on allopurinol.  An After Visit Summary was printed and given to the patient.  FOLLOW UP: 3 mo--check FLP and PSA at that time

## 2014-01-12 ENCOUNTER — Other Ambulatory Visit: Payer: Self-pay | Admitting: Family Medicine

## 2014-01-12 MED ORDER — ALBUTEROL SULFATE HFA 108 (90 BASE) MCG/ACT IN AERS: 2.0000 | INHALATION_SPRAY | RESPIRATORY_TRACT | Status: DC | PRN

## 2014-01-12 MED ORDER — TERAZOSIN HCL 2 MG PO CAPS: 2.0000 mg | ORAL_CAPSULE | Freq: Every day | ORAL | Status: DC

## 2014-01-12 MED ORDER — LISINOPRIL 20 MG PO TABS: ORAL_TABLET | ORAL | Status: DC

## 2014-01-12 MED ORDER — HYDROCHLOROTHIAZIDE 25 MG PO TABS
25.0000 mg | ORAL_TABLET | Freq: Every morning | ORAL | Status: DC
Start: 2014-01-12 — End: 2014-04-19

## 2014-01-12 MED ORDER — ALLOPURINOL 300 MG PO TABS: 300.0000 mg | ORAL_TABLET | Freq: Every day | ORAL | Status: DC

## 2014-01-12 MED ORDER — BISOPROLOL-HYDROCHLOROTHIAZIDE 5-6.25 MG PO TABS: 1.0000 | ORAL_TABLET | Freq: Every morning | ORAL | Status: DC

## 2014-01-19 ENCOUNTER — Telehealth: Payer: Self-pay | Admitting: Family Medicine

## 2014-01-19 DIAGNOSIS — I10 Essential (primary) hypertension: Secondary | ICD-10-CM

## 2014-01-19 DIAGNOSIS — E785 Hyperlipidemia, unspecified: Secondary | ICD-10-CM

## 2014-01-19 MED ORDER — CYCLOBENZAPRINE HCL 10 MG PO TABS: 10.0000 mg | ORAL_TABLET | Freq: Every day | ORAL | Status: DC

## 2014-01-19 NOTE — Telephone Encounter (Signed)
Rx request for flexeril.  Patient last Rx was filled 07/18/13 # 90 x 1 rf.  Please advisw.

## 2014-01-19 NOTE — Telephone Encounter (Signed)
eRX sent to pharmacy for cyclobenzaprine.

## 2014-02-16 ENCOUNTER — Encounter: Payer: Self-pay | Admitting: Family Medicine

## 2014-04-13 ENCOUNTER — Ambulatory Visit: Payer: Medicare Other | Admitting: Family Medicine

## 2014-04-13 ENCOUNTER — Ambulatory Visit (INDEPENDENT_AMBULATORY_CARE_PROVIDER_SITE_OTHER): Payer: Medicare Other

## 2014-04-13 ENCOUNTER — Other Ambulatory Visit (INDEPENDENT_AMBULATORY_CARE_PROVIDER_SITE_OTHER): Payer: Medicare Other

## 2014-04-13 ENCOUNTER — Telehealth: Payer: Self-pay

## 2014-04-13 DIAGNOSIS — C61 Malignant neoplasm of prostate: Secondary | ICD-10-CM

## 2014-04-13 DIAGNOSIS — E785 Hyperlipidemia, unspecified: Secondary | ICD-10-CM | POA: Diagnosis not present

## 2014-04-13 DIAGNOSIS — Z23 Encounter for immunization: Secondary | ICD-10-CM | POA: Diagnosis not present

## 2014-04-13 LAB — LIPID PANEL
Cholesterol: 207 mg/dL — ABNORMAL HIGH (ref 0–200)
HDL: 33.1 mg/dL — AB (ref >39.00)
LDL Cholesterol: 140 mg/dL — ABNORMAL HIGH (ref 0–99)
NONHDL: 173.9
Total CHOL/HDL Ratio: 6
Triglycerides: 168 mg/dL — ABNORMAL HIGH (ref 0.0–149.0)
VLDL: 33.6 mg/dL (ref 0.0–40.0)

## 2014-04-13 NOTE — Telephone Encounter (Signed)
Please enter lab orders for this pt. Thanks

## 2014-04-13 NOTE — Telephone Encounter (Signed)
He actually already has these in as "future orders" so they just need to be released.-thx

## 2014-04-14 LAB — PSA: PSA: 1.5 ng/mL (ref 0.10–4.00)

## 2014-04-19 ENCOUNTER — Encounter: Payer: Self-pay | Admitting: Family Medicine

## 2014-04-19 ENCOUNTER — Ambulatory Visit (INDEPENDENT_AMBULATORY_CARE_PROVIDER_SITE_OTHER): Payer: Medicare Other | Admitting: Family Medicine

## 2014-04-19 VITALS — BP 134/82 | HR 70 | Temp 98.6°F | Resp 22 | Ht 70.5 in | Wt 312.0 lb

## 2014-04-19 DIAGNOSIS — E785 Hyperlipidemia, unspecified: Secondary | ICD-10-CM

## 2014-04-19 DIAGNOSIS — G894 Chronic pain syndrome: Secondary | ICD-10-CM | POA: Diagnosis not present

## 2014-04-19 DIAGNOSIS — C61 Malignant neoplasm of prostate: Secondary | ICD-10-CM

## 2014-04-19 DIAGNOSIS — G43109 Migraine with aura, not intractable, without status migrainosus: Secondary | ICD-10-CM

## 2014-04-19 DIAGNOSIS — I10 Essential (primary) hypertension: Secondary | ICD-10-CM | POA: Diagnosis not present

## 2014-04-19 DIAGNOSIS — J449 Chronic obstructive pulmonary disease, unspecified: Secondary | ICD-10-CM

## 2014-04-19 MED ORDER — TERAZOSIN HCL 2 MG PO CAPS: 2.0000 mg | ORAL_CAPSULE | Freq: Every day | ORAL | Status: DC

## 2014-04-19 MED ORDER — ATORVASTATIN CALCIUM 20 MG PO TABS: 20.0000 mg | ORAL_TABLET | Freq: Every day | ORAL | Status: DC

## 2014-04-19 MED ORDER — CITALOPRAM HYDROBROMIDE 40 MG PO TABS
40.0000 mg | ORAL_TABLET | Freq: Every day | ORAL | Status: DC
Start: 2014-04-19 — End: 2014-10-20

## 2014-04-19 MED ORDER — UMECLIDINIUM-VILANTEROL 62.5-25 MCG/INH IN AEPB: INHALATION_SPRAY | RESPIRATORY_TRACT | Status: DC

## 2014-04-19 MED ORDER — BISOPROLOL-HYDROCHLOROTHIAZIDE 5-6.25 MG PO TABS: 1.0000 | ORAL_TABLET | Freq: Every morning | ORAL | Status: DC

## 2014-04-19 MED ORDER — OXYCODONE HCL 5 MG PO TABS: ORAL_TABLET | ORAL | Status: DC

## 2014-04-19 MED ORDER — HYDROCHLOROTHIAZIDE 25 MG PO TABS
25.0000 mg | ORAL_TABLET | Freq: Every morning | ORAL | Status: DC
Start: 2014-04-19 — End: 2014-10-20

## 2014-04-19 MED ORDER — CYCLOBENZAPRINE HCL 10 MG PO TABS: 10.0000 mg | ORAL_TABLET | Freq: Every day | ORAL | Status: DC

## 2014-04-19 MED ORDER — ZOLPIDEM TARTRATE 10 MG PO TABS: 10.0000 mg | ORAL_TABLET | Freq: Every evening | ORAL | Status: DC | PRN

## 2014-04-19 MED ORDER — LISINOPRIL 20 MG PO TABS: ORAL_TABLET | ORAL | Status: DC

## 2014-04-19 MED ORDER — ALLOPURINOL 300 MG PO TABS: 300.0000 mg | ORAL_TABLET | Freq: Every day | ORAL | Status: DC

## 2014-04-19 MED ORDER — GABAPENTIN 300 MG PO CAPS: 300.0000 mg | ORAL_CAPSULE | Freq: Two times a day (BID) | ORAL | Status: DC

## 2014-04-19 NOTE — Progress Notes (Signed)
Pre visit review using our clinic review tool, if applicable. No additional management support is needed unless otherwise documented below in the visit note. 

## 2014-04-19 NOTE — Progress Notes (Signed)
OFFICE NOTE  04/19/2014  CC:  Chief Complaint  Patient presents with  . Follow-up   HPI: Patient is a 63 y.o. Caucasian male who is here for 3 mo f/u hyperlipidemia, chronic pain syndrome (chronic diffuse musculoskeletal pain, multifactorial--obesity, osteoarthritis, chronic gouty arthritis), hx of prostate cancer, COPD.   Breathing stable.  Still smoking, but "not as much".  Never filled the opana rx I gave him last visit due to cost of med (he brought rx back with him today and I shredded it).  Still in chronic pain, mostly left rotator cuff, left hip, left knee.    In the last year has had 4 distinct episodes, all in the mornings, of having double-to-triple vision with disequilibrium but room not spinning,+ nausea, sees floaters, has an intense HA in back of head after the visual sx's resolve (45 seconds or so), most recent was several weeks ago).  The headache that follows stays in occipital region and lasts approx 1 hour and he takes no med for it.  No relation of the episodes to medications.  After the first episode he had a floater remain in left eye and it is still there and he has not seen his ophthalmologist since then. BP monitoring at home shows normal numbers.  We reviewed his recent lipid panel and PSA today (see below)  Pertinent PMH:  Past medical, surgical, social, and family history reviewed and no changes are noted since last office visit.  MEDS:  Outpatient Prescriptions Prior to Visit  Medication Sig Dispense Refill  . acyclovir (ZOVIRAX) 400 MG tablet 1 tab po tid x 5d for acute episode of fever blisters  90 tablet  1  . albuterol (PROAIR HFA) 108 (90 BASE) MCG/ACT inhaler Inhale 2 puffs into the lungs every 4 (four) hours as needed.  3 Inhaler  1  . albuterol (PROVENTIL) (2.5 MG/3ML) 0.083% nebulizer solution Take 3 mLs (2.5 mg total) by nebulization every 4 (four) hours as needed.  225 mL  1  . allopurinol (ZYLOPRIM) 300 MG tablet Take 1 tablet (300 mg total) by  mouth daily.  90 tablet  1  . aspirin 325 MG tablet Take 325 mg by mouth daily.        . bisoprolol-hydrochlorothiazide (ZIAC) 5-6.25 MG per tablet Take 1 tablet by mouth every morning.  90 tablet  1  . citalopram (CELEXA) 40 MG tablet Take 1 tablet (40 mg total) by mouth at bedtime.  90 tablet  1  . colchicine 0.6 MG tablet Take 1 tablet (0.6 mg total) by mouth 2 (two) times daily as needed (gout flare ups).  60 tablet  6  . cyclobenzaprine (FLEXERIL) 10 MG tablet Take 1 tablet (10 mg total) by mouth daily.  90 tablet  1  . furosemide (LASIX) 40 MG tablet Take 1 tablet (40 mg total) by mouth daily as needed.  90 tablet  1  . gabapentin (NEURONTIN) 300 MG capsule Take 1 capsule (300 mg total) by mouth 2 (two) times daily.  180 capsule  1  . hydrochlorothiazide (HYDRODIURIL) 25 MG tablet Take 1 tablet (25 mg total) by mouth every morning.  90 tablet  1  . lisinopril (PRINIVIL,ZESTRIL) 20 MG tablet 1 tab po bid  180 tablet  1  . niacin 500 MG tablet Take 1 tablet (500 mg total) by mouth 2 (two) times daily with a meal.  180 tablet  1  . oxymorphone (OPANA) 5 MG tablet 1-2 tabs po bid prn pain  60 tablet  0  . terazosin (HYTRIN) 2 MG capsule Take 1 capsule (2 mg total) by mouth at bedtime.  90 capsule  1  . Umeclidinium-Vilanterol (ANORO ELLIPTA) 62.5-25 MCG/INH AEPB 1 inhalation once daily  1 each  12  . Potassium 99 MG TABS Take 1 tablet by mouth daily.        Marland Kitchen zolpidem (AMBIEN) 10 MG tablet Take 1 tablet (10 mg total) by mouth at bedtime as needed for sleep.  90 tablet  1  . predniSONE (DELTASONE) 20 MG tablet 3 tabs po qd x 4d, then 2 tabs po qd x 4d, then 1 tab po qd x 4d  24 tablet  0   No facility-administered medications prior to visit.    PE: Blood pressure 134/82, pulse 70, temperature 98.6 F (37 C), temperature source Temporal, resp. rate 22, height 5' 10.5" (1.791 m), weight 312 lb (141.522 kg), SpO2 94.00%. Gen: Alert, well appearing.  Patient is oriented to person, place, time,  and situation. AFFECT: pleasant, lucid thought and speech. KGU:RKYH: no injection, icteris, swelling, or exudate.  EOMI, PERRLA. Mouth: lips without lesion/swelling.  Oral mucosa pink and moist. Oropharynx without erythema, exudate, or swelling.  Neck: supple/nontender.  No LAD, mass, or TM.  Carotid pulses 2+ bilaterally, without bruits. CV: RRR, distant S1 and S2.  No audible m/r/g. LUNGS: diffusely diminished BS, nonlabored breathing. ABD: rotund, soft/nt. EXT: no clubbing, cyanosis, or edema.   LAB:  Lab Results  Component Value Date   CHOL 207* 04/13/2014   HDL 33.10* 04/13/2014   LDLCALC 140* 04/13/2014   LDLDIRECT 130.7 12/02/2012   TRIG 168.0* 04/13/2014   CHOLHDL 6 04/13/2014   Lab Results  Component Value Date   PSA 1.50 04/13/2014   PSA 4.91* 08/23/2010   PSA 1.97 (nl) 05/14/2008     Chemistry      Component Value Date/Time   NA 138 01/11/2014 1452   K 4.0 01/11/2014 1452   CL 100 01/11/2014 1452   CO2 30 01/11/2014 1452   BUN 19 01/11/2014 1452   CREATININE 0.8 01/11/2014 1452   CREATININE 0.86 07/22/2012 1736      Component Value Date/Time   CALCIUM 9.0 01/11/2014 1452   ALKPHOS 73 01/11/2014 1452   AST 20 01/11/2014 1452   ALT 18 01/11/2014 1452   BILITOT 0.6 01/11/2014 1452       IMPRESSION AND PLAN:  1) HTN;The current medical regimen is effective;  continue present plan and medications.  2) Chronic pain syndrome; will try oxycodone 5mg , 1-2 tabs bid prn, #60--rx handed to pt today. Controlled substances contract is signed and in chart.  UDS last o/v had appropriate results.  3) Hyperlipidemia; statin intolerant in the past. He'll try atorvastatin at 20mg  dose and try to take it twice per week. He'll call with report of side effects in 1-2 mo and we'll try to increase frequency of dosing gradually.  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  No treatment or further w/u at this time. Signs/symptoms to call or return for were reviewed and pt  expressed understanding.  5) COPD; emphasized importance of smoking cessation-he knows this. Continue inhalers.  6) Hx of prostate cancer: I did an "interim" PSA check at the patient's request and it was lower than the most recent one at his urologist's.  He feels reassured.  He'll keep f/u appt with his urologist in 3 mo.  Flu vaccine today. An After Visit Summary was printed and given to the patient.  FOLLOW UP: 6 mo

## 2014-04-20 ENCOUNTER — Telehealth: Payer: Self-pay | Admitting: Family Medicine

## 2014-04-20 NOTE — Telephone Encounter (Signed)
emmi emailed °

## 2014-06-01 ENCOUNTER — Other Ambulatory Visit: Payer: Self-pay | Admitting: Family Medicine

## 2014-06-01 ENCOUNTER — Telehealth: Payer: Self-pay

## 2014-06-01 MED ORDER — AZITHROMYCIN 250 MG PO TABS: ORAL_TABLET | ORAL | Status: DC

## 2014-06-01 MED ORDER — PREDNISONE 20 MG PO TABS: ORAL_TABLET | ORAL | Status: DC

## 2014-06-01 NOTE — Telephone Encounter (Signed)
Mr. Scott Rollins needs antibiotics and steriods  As his COPD is acting up. Please let him know when the rx have been called in.

## 2014-06-01 NOTE — Telephone Encounter (Signed)
Please advise 

## 2014-06-01 NOTE — Telephone Encounter (Signed)
OK, I'll eRx azithromycin and prednisone. Tell pt to NOT take his generic lipitor (atorvastatin) for the next 10d--it does not interact well with the azithromycin.-thx

## 2014-06-01 NOTE — Telephone Encounter (Signed)
Rx's sent.  Patient aware.

## 2014-06-01 NOTE — Telephone Encounter (Signed)
Patient aware rx has been sent in  

## 2014-06-01 NOTE — Telephone Encounter (Signed)
Scott Rollins is requesting meds for his COPD. Needs anitbiotic and Prednisone.

## 2014-06-01 NOTE — Telephone Encounter (Signed)
Patient Information:  Caller Name: Kayshaun  Phone: [phone]  Patient: Scott Rollins, Scott Rollins  Gender: Male  DOB: 03/05/1951  Age: 63 Years  PCP: [facility])  Office Follow Up:  Does the office need to follow up with this patient?: No  Instructions For The Office: N/A   Symptoms  Reason For Call & Symptoms: Pt has COPD and has a viral cold and hhis COPD is flaring up.  He thinks it has gone to a bacterial infection.  He wants an antibiotic and Prednisone called in.  Computer went down and when I called back the pt had recalled the office and they are going to call in medications for him.  Reviewed Health History In EMR: N/A  Reviewed Medications In EMR: N/A  Reviewed Allergies In EMR: N/A  Reviewed Surgeries / Procedures: N/A  Date of Onset of Symptoms: Unknown  Guideline(s) Used:  No Protocol Available - Information Only  Disposition Per Guideline:   Home Care  Reason For Disposition Reached:   Information only question and nurse able to answer  Advice Given:  N/A  Patient Refused Recommendation:  Patient Requests Prescription  Pt requesting an antibiotic and Prednisone.  Pt. states these were called in from the office.  A note is in EPIC stating hat he is requesting these by the office [name].

## 2014-07-19 ENCOUNTER — Encounter: Payer: Self-pay | Admitting: Family Medicine

## 2014-07-19 ENCOUNTER — Ambulatory Visit (INDEPENDENT_AMBULATORY_CARE_PROVIDER_SITE_OTHER): Payer: Medicare Other | Admitting: Family Medicine

## 2014-07-19 VITALS — BP 157/91 | HR 67 | Temp 97.9°F | Resp 22 | Ht 70.5 in | Wt 321.0 lb

## 2014-07-19 DIAGNOSIS — J069 Acute upper respiratory infection, unspecified: Secondary | ICD-10-CM | POA: Diagnosis not present

## 2014-07-19 DIAGNOSIS — Z720 Tobacco use: Secondary | ICD-10-CM | POA: Diagnosis not present

## 2014-07-19 DIAGNOSIS — J441 Chronic obstructive pulmonary disease with (acute) exacerbation: Secondary | ICD-10-CM | POA: Diagnosis not present

## 2014-07-19 MED ORDER — AZITHROMYCIN 250 MG PO TABS: ORAL_TABLET | ORAL | Status: DC

## 2014-07-19 MED ORDER — PREDNISONE 20 MG PO TABS: ORAL_TABLET | ORAL | Status: DC

## 2014-07-19 MED ORDER — METHYLPREDNISOLONE ACETATE 80 MG/ML IJ SUSP
120.0000 mg | Freq: Once | INTRAMUSCULAR | Status: AC
  Administered 2014-07-19: 120 mg via INTRAMUSCULAR

## 2014-07-19 MED ORDER — IPRATROPIUM BROMIDE 0.02 % IN SOLN
0.5000 mg | Freq: Once | RESPIRATORY_TRACT | Status: AC
  Administered 2014-07-19: 0.5 mg via RESPIRATORY_TRACT

## 2014-07-19 MED ORDER — ALBUTEROL SULFATE (2.5 MG/3ML) 0.083% IN NEBU
2.5000 mg | INHALATION_SOLUTION | Freq: Once | RESPIRATORY_TRACT | Status: AC
  Administered 2014-07-19: 2.5 mg via RESPIRATORY_TRACT

## 2014-07-19 NOTE — Progress Notes (Signed)
Pre visit review using our clinic review tool, if applicable. No additional management support is needed unless otherwise documented below in the visit note. 

## 2014-07-19 NOTE — Patient Instructions (Signed)
Buy OTC probiotic (any brand is fine).

## 2014-07-19 NOTE — Progress Notes (Signed)
OFFICE NOTE  07/19/2014  CC:  Chief Complaint  Patient presents with  . Cough  . Wheezing   HPI: Patient is a 64 y.o. Caucasian male who is here for cough/wheeze. Onset 2-3 wks ago, nasal congestion/runny nose/PND, mild scratchy throat, cough, gravely throat, wheezing. Becoming more breathless and wheezy, lots of sputum coming up.  Using albut neb at home, most recent 4 hours ago. Denies fevers.  Still smoking cigarettes.  Pertinent PMH:  Past medical, surgical, social, and family history reviewed and no changes are noted since last office visit.  MEDS: Not on azith or prednisone listed below Outpatient Prescriptions Prior to Visit  Medication Sig Dispense Refill  . acyclovir (ZOVIRAX) 400 MG tablet 1 tab po tid x 5d for acute episode of fever blisters 90 tablet 1  . albuterol (PROAIR HFA) 108 (90 BASE) MCG/ACT inhaler Inhale 2 puffs into the lungs every 4 (four) hours as needed. 3 Inhaler 1  . albuterol (PROVENTIL) (2.5 MG/3ML) 0.083% nebulizer solution Take 3 mLs (2.5 mg total) by nebulization every 4 (four) hours as needed. 225 mL 1  . allopurinol (ZYLOPRIM) 300 MG tablet Take 1 tablet (300 mg total) by mouth daily. 90 tablet 1  . aspirin 325 MG tablet Take 325 mg by mouth daily.      Marland Kitchen atorvastatin (LIPITOR) 20 MG tablet Take 1 tablet (20 mg total) by mouth daily. 30 tablet 3  . bisoprolol-hydrochlorothiazide (ZIAC) 5-6.25 MG per tablet Take 1 tablet by mouth every morning. 90 tablet 1  . citalopram (CELEXA) 40 MG tablet Take 1 tablet (40 mg total) by mouth at bedtime. 90 tablet 1  . colchicine 0.6 MG tablet Take 1 tablet (0.6 mg total) by mouth 2 (two) times daily as needed (gout flare ups). 60 tablet 6  . cyclobenzaprine (FLEXERIL) 10 MG tablet Take 1 tablet (10 mg total) by mouth daily. 90 tablet 1  . furosemide (LASIX) 40 MG tablet Take 1 tablet (40 mg total) by mouth daily as needed. 90 tablet 1  . gabapentin (NEURONTIN) 300 MG capsule Take 1 capsule (300 mg total) by mouth 2  (two) times daily. 180 capsule 1  . hydrochlorothiazide (HYDRODIURIL) 25 MG tablet Take 1 tablet (25 mg total) by mouth every morning. 90 tablet 1  . lisinopril (PRINIVIL,ZESTRIL) 20 MG tablet 1 tab po bid 180 tablet 1  . niacin 500 MG tablet Take 1 tablet (500 mg total) by mouth 2 (two) times daily with a meal. 180 tablet 1  . oxyCODONE (OXY IR/ROXICODONE) 5 MG immediate release tablet 1-2 tabs po bid prn pain 60 tablet 0  . terazosin (HYTRIN) 2 MG capsule Take 1 capsule (2 mg total) by mouth at bedtime. 90 capsule 1  . Umeclidinium-Vilanterol (ANORO ELLIPTA) 62.5-25 MCG/INH AEPB 1 inhalation once daily 90 each 1  . zolpidem (AMBIEN) 10 MG tablet Take 1 tablet (10 mg total) by mouth at bedtime as needed for sleep. 90 tablet 1  . Potassium 99 MG TABS Take 1 tablet by mouth daily.      Marland Kitchen azithromycin (ZITHROMAX) 250 MG tablet 2 tabs po qd x 1d, then 1 tab po qd x 4d 6 tablet 0  . predniSONE (DELTASONE) 20 MG tablet 2 tabs po qd x 5d, then 1 tab po qd x 5d 15 tablet 0   No facility-administered medications prior to visit.    PE: Blood pressure 157/91, pulse 67, temperature 97.9 F (36.6 C), temperature source Temporal, resp. rate 22, height 5' 10.5" (1.791 m),  weight 321 lb (145.605 kg), SpO2 90 %. VS: noted--normal. Gen: alert, NAD, NONTOXIC APPEARING. HEENT: eyes without injection, drainage, or swelling.  Ears: EACs clear, TMs with normal light reflex and landmarks.  Nose: Clear rhinorrhea, with some dried, crusty exudate adherent to mildly injected mucosa.  No purulent d/c.  No paranasal sinus TTP.  No facial swelling.  Throat and mouth without focal lesion.  No pharyngial swelling, erythema, or exudate.   Neck: supple, no LAD.   LUNGS: CTA bilat on inspiration but aeration is diminished diffusely.  He has prolonged exp phase with diminished aeration diffusely, appears mildly SOB.  Trace esp wheeze at end of exp phase. CV: RRR, no m/r/g. EXT: no c/c/e SKIN: no rash   IMPRESSION AND  PLAN:  Prolonged URI with COPD exacerbation and ongoing tobacco abuse. Encouraged total tobacco cessation. Alb/atr neb in office today brought improved aeration, less labored resps, subjective improvement. Depo-medrol 120mg  IM in office today. Tomorrow, start prednisone 40mg  qd x 5d, then 20mg  qd x 5d. Start z-pack tonight. Continue OTC robitussin DM and saline nasal irrigation. Add OTC probiotic qd.  An After Visit Summary was printed and given to the patient.  FOLLOW UP: prn

## 2014-10-12 ENCOUNTER — Other Ambulatory Visit: Payer: Self-pay | Admitting: Family Medicine

## 2014-10-12 ENCOUNTER — Other Ambulatory Visit (INDEPENDENT_AMBULATORY_CARE_PROVIDER_SITE_OTHER): Payer: Medicare Other

## 2014-10-12 DIAGNOSIS — I10 Essential (primary) hypertension: Secondary | ICD-10-CM

## 2014-10-12 DIAGNOSIS — M1000 Idiopathic gout, unspecified site: Secondary | ICD-10-CM

## 2014-10-12 DIAGNOSIS — E785 Hyperlipidemia, unspecified: Secondary | ICD-10-CM | POA: Diagnosis not present

## 2014-10-12 LAB — COMPREHENSIVE METABOLIC PANEL
ALK PHOS: 77 U/L (ref 39–117)
ALT: 17 U/L (ref 0–53)
AST: 14 U/L (ref 0–37)
Albumin: 4 g/dL (ref 3.5–5.2)
BUN: 20 mg/dL (ref 6–23)
CO2: 31 mEq/L (ref 19–32)
Calcium: 9.1 mg/dL (ref 8.4–10.5)
Chloride: 101 mEq/L (ref 96–112)
Creatinine, Ser: 0.96 mg/dL (ref 0.40–1.50)
GFR: 83.54 mL/min (ref >60.00)
Glucose, Bld: 96 mg/dL (ref 70–99)
POTASSIUM: 4 meq/L (ref 3.5–5.1)
Sodium: 140 mEq/L (ref 135–145)
TOTAL PROTEIN: 6.6 g/dL (ref 6.0–8.3)
Total Bilirubin: 0.5 mg/dL (ref 0.2–1.2)

## 2014-10-12 LAB — LIPID PANEL
CHOL/HDL RATIO: 5
Cholesterol: 215 mg/dL — ABNORMAL HIGH (ref 0–200)
HDL: 41.8 mg/dL (ref >39.00)
LDL Cholesterol: 142 mg/dL — ABNORMAL HIGH (ref 0–99)
NONHDL: 173.2
Triglycerides: 155 mg/dL — ABNORMAL HIGH (ref 0.0–149.0)
VLDL: 31 mg/dL (ref 0.0–40.0)

## 2014-10-12 LAB — URIC ACID: Uric Acid, Serum: 5.8 mg/dL (ref 4.0–7.8)

## 2014-10-19 ENCOUNTER — Ambulatory Visit: Payer: Medicare Other | Admitting: Family Medicine

## 2014-10-20 ENCOUNTER — Ambulatory Visit (INDEPENDENT_AMBULATORY_CARE_PROVIDER_SITE_OTHER): Payer: Medicare Other | Admitting: Family Medicine

## 2014-10-20 ENCOUNTER — Encounter: Payer: Self-pay | Admitting: Family Medicine

## 2014-10-20 VITALS — BP 142/80 | HR 80 | Temp 98.0°F | Ht 70.5 in | Wt 319.0 lb

## 2014-10-20 DIAGNOSIS — G894 Chronic pain syndrome: Secondary | ICD-10-CM

## 2014-10-20 DIAGNOSIS — F17200 Nicotine dependence, unspecified, uncomplicated: Secondary | ICD-10-CM

## 2014-10-20 DIAGNOSIS — M150 Primary generalized (osteo)arthritis: Secondary | ICD-10-CM

## 2014-10-20 DIAGNOSIS — I10 Essential (primary) hypertension: Secondary | ICD-10-CM

## 2014-10-20 DIAGNOSIS — M159 Polyosteoarthritis, unspecified: Secondary | ICD-10-CM

## 2014-10-20 DIAGNOSIS — E785 Hyperlipidemia, unspecified: Secondary | ICD-10-CM

## 2014-10-20 DIAGNOSIS — R202 Paresthesia of skin: Secondary | ICD-10-CM

## 2014-10-20 DIAGNOSIS — J449 Chronic obstructive pulmonary disease, unspecified: Secondary | ICD-10-CM

## 2014-10-20 DIAGNOSIS — M792 Neuralgia and neuritis, unspecified: Secondary | ICD-10-CM

## 2014-10-20 LAB — VITAMIN B12: Vitamin B-12: 315 pg/mL (ref 211–911)

## 2014-10-20 LAB — HEMOGLOBIN A1C: Hgb A1c MFr Bld: 5.9 % (ref 4.6–6.5)

## 2014-10-20 MED ORDER — ALBUTEROL SULFATE HFA 108 (90 BASE) MCG/ACT IN AERS: 2.0000 | INHALATION_SPRAY | RESPIRATORY_TRACT | Status: DC | PRN

## 2014-10-20 MED ORDER — ACYCLOVIR 400 MG PO TABS: ORAL_TABLET | ORAL | Status: DC

## 2014-10-20 MED ORDER — ZOLPIDEM TARTRATE 10 MG PO TABS: 10.0000 mg | ORAL_TABLET | Freq: Every evening | ORAL | Status: DC | PRN

## 2014-10-20 MED ORDER — ALLOPURINOL 300 MG PO TABS: 300.0000 mg | ORAL_TABLET | Freq: Every day | ORAL | Status: DC

## 2014-10-20 MED ORDER — TERAZOSIN HCL 2 MG PO CAPS: 2.0000 mg | ORAL_CAPSULE | Freq: Every day | ORAL | Status: DC

## 2014-10-20 MED ORDER — GABAPENTIN 300 MG PO CAPS: ORAL_CAPSULE | ORAL | Status: DC

## 2014-10-20 MED ORDER — BISOPROLOL-HYDROCHLOROTHIAZIDE 5-6.25 MG PO TABS: 1.0000 | ORAL_TABLET | Freq: Every morning | ORAL | Status: DC

## 2014-10-20 MED ORDER — FUROSEMIDE 40 MG PO TABS: 40.0000 mg | ORAL_TABLET | Freq: Every day | ORAL | Status: DC | PRN

## 2014-10-20 MED ORDER — CYCLOBENZAPRINE HCL 10 MG PO TABS: 10.0000 mg | ORAL_TABLET | Freq: Every day | ORAL | Status: DC

## 2014-10-20 MED ORDER — LISINOPRIL 20 MG PO TABS: ORAL_TABLET | ORAL | Status: DC

## 2014-10-20 MED ORDER — HYDROCHLOROTHIAZIDE 25 MG PO TABS: 25.0000 mg | ORAL_TABLET | Freq: Every morning | ORAL | Status: DC

## 2014-10-20 MED ORDER — CITALOPRAM HYDROBROMIDE 40 MG PO TABS: 40.0000 mg | ORAL_TABLET | Freq: Every day | ORAL | Status: DC

## 2014-10-20 MED ORDER — TIOTROPIUM BROMIDE MONOHYDRATE 2.5 MCG/ACT IN AERS: INHALATION_SPRAY | RESPIRATORY_TRACT | Status: DC

## 2014-10-20 MED ORDER — OXYCODONE HCL 10 MG PO TABS: ORAL_TABLET | ORAL | Status: DC

## 2014-10-20 MED ORDER — ALBUTEROL SULFATE (2.5 MG/3ML) 0.083% IN NEBU: 2.5000 mg | INHALATION_SOLUTION | RESPIRATORY_TRACT | Status: DC | PRN

## 2014-10-20 NOTE — Progress Notes (Signed)
Pre visit review using our clinic review tool, if applicable. No additional management support is needed unless otherwise documented below in the visit note. 

## 2014-10-20 NOTE — Patient Instructions (Addendum)
Increasea your gabapentin by 1 tab every 5 days to a max of 2 tabs three times per day.

## 2014-10-20 NOTE — Progress Notes (Signed)
OFFICE VISIT  10/22/2014   CC:  Chief Complaint  Patient presents with  . Follow-up    6 months   HPI:    Patient is a 64 y.o. Caucasian male who presents for f/u HTN, hyperlipidemia, hx of CAD, COPD. Historically he has only been able to tolerate 20mg  atorv due to musculoskeletal pain. Hx of diffuse joint pains even w/out statin, combo of osteoarthritis and gout.  No acute gout flares in years. Faithfully takes allopurinol daily.  He uses oxycodone sparingly (last rx 04/2014 for #60 has lasted him until now).  Also has chronic bilat plantar surface burning pain, also tingling paresthesias and some feeling of decreased sensation.  Neurontin 300 mg tid has helped some.  Has not seen cardiologist since getting cleared for his prostate ca treatments in 2012.  He has appt set with Dr. Harrington Challenger very soon. He gets approp prost ca surveillance.  He has cut back on smoking, says he is close to stopping.    BPs at home checked "from time to time".  Says it is 120s-140 over 70s-80s, HR 70s-80s.  ROS: a few episodes of lightheaded feeling and racing heart in the context of taking decongestants.  No chest pains.  He has chronic SOB at rest, much worse with exertion.   Past Medical History  Diagnosis Date  . Osteoarthrosis, unspecified whether generalized or localized, unspecified site     DDD  . Hyperlipidemia   . Depression     ?bipolar dx by psychiatrist?  . Anxiety   . 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 ED (erectile dysfunction)   . Obesities, morbid   . 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 Diverticulosis 2006  . Seizure disorder 08-15-2011    Deja vu sensations: no w/u.  These stopped when neurontin 300mg  tid was started for a different reason.  . Gouty arthritis     Dr. Amil Amen  . Radiation     Pelvic--for prostate ca: 25 treatments/01/2011  . Status post chemotherapy     10/2010 thru 06/2011  . Coronary atherosclerosis  of unspecified type of vessel, native or graft 2005    MI, s/p PCI with stent (pt reports 20+ interventions in the past, most recent cat 6/08 showed patent stents).  Normal LV function.  Nuclear stress test NEG 8/09.  Marland Kitchen Hypertension   . Myocardial infarction     x 2 or 3  . COPD (chronic obstructive pulmonary disease)     part of Breo-ellipta study  . OSA on CPAP     trying to get back on CPAP as of 07/2013  . Prostate cancer 09/2010    Localized, high risk disease (Dr. Kimbrough/Grapey):  s/p 5 wks external beam radiation + seed boost, as well as hormone blockade x 1 yr.  . Hematochezia 09/2012    Hyperplastic polyp, diverticulosis, and internal hemorrhoids found on colonoscopy 10/2012    Past Surgical History  Procedure Laterality Date  . Seed implants      02/24/2011/ in prostate  . Cardiac catheterization  23 caths    pt states he has 6 stents  . Hernia [facility]  yrs ago    [employer]  . Colonoscopy with propofol N/A 04/05/2013    Dr. Fuller Plan.  Polypectomy (hyperplastic--recall 10 yrs).  Moderate diverticulosis, +internal hemorrhoids.  No radiation proctitis.  Marland Kitchen Hot hemostasis N/A 04/05/2013    Procedure: HOT HEMOSTASIS (ARGON PLASMA COAGULATION/BICAP);  Surgeon: Norberto Sorenson  Sindy Guadeloupe, MD;  Location: [facility];  Service: Endoscopy;  Laterality: N/A;    Outpatient Prescriptions Prior to Visit  Medication Sig Dispense Refill  . aspirin 325 MG tablet Take 325 mg by mouth daily.      Marland Kitchen atorvastatin (LIPITOR) 20 MG tablet Take 1 tablet (20 mg total) by mouth daily. 30 tablet 3  . colchicine 0.6 MG tablet Take 1 tablet (0.6 mg total) by mouth 2 (two) times daily as needed (gout flare ups). 60 tablet 6  . niacin 500 MG tablet Take 1 tablet (500 mg total) by mouth 2 (two) times daily with a meal. 180 tablet 1  . Potassium 99 MG TABS Take 1 tablet by mouth daily.      Marland Kitchen Umeclidinium-Vilanterol (ANORO ELLIPTA) 62.5-25 MCG/INH AEPB 1 inhalation once daily 90 each 1  . acyclovir (ZOVIRAX) 400 MG tablet 1  tab po tid x 5d for acute episode of fever blisters 90 tablet 1  . albuterol (PROAIR HFA) 108 (90 BASE) MCG/ACT inhaler Inhale 2 puffs into the lungs every 4 (four) hours as needed. 3 Inhaler 1  . albuterol (PROVENTIL) (2.5 MG/3ML) 0.083% nebulizer solution Take 3 mLs (2.5 mg total) by nebulization every 4 (four) hours as needed. 225 mL 1  . allopurinol (ZYLOPRIM) 300 MG tablet Take 1 tablet (300 mg total) by mouth daily. 90 tablet 1  . bisoprolol-hydrochlorothiazide (ZIAC) 5-6.25 MG per tablet Take 1 tablet by mouth every morning. 90 tablet 1  . citalopram (CELEXA) 40 MG tablet Take 1 tablet (40 mg total) by mouth at bedtime. 90 tablet 1  . cyclobenzaprine (FLEXERIL) 10 MG tablet Take 1 tablet (10 mg total) by mouth daily. 90 tablet 1  . furosemide (LASIX) 40 MG tablet Take 1 tablet (40 mg total) by mouth daily as needed. 90 tablet 1  . gabapentin (NEURONTIN) 300 MG capsule Take 1 capsule (300 mg total) by mouth 2 (two) times daily. 180 capsule 1  . hydrochlorothiazide (HYDRODIURIL) 25 MG tablet Take 1 tablet (25 mg total) by mouth every morning. 90 tablet 1  . lisinopril (PRINIVIL,ZESTRIL) 20 MG tablet 1 tab po bid 180 tablet 1  . oxyCODONE (OXY IR/ROXICODONE) 5 MG immediate release tablet 1-2 tabs po bid prn pain 60 tablet 0  . terazosin (HYTRIN) 2 MG capsule Take 1 capsule (2 mg total) by mouth at bedtime. 90 capsule 1  . zolpidem (AMBIEN) 10 MG tablet Take 1 tablet (10 mg total) by mouth at bedtime as needed for sleep. 90 tablet 1  . azithromycin (ZITHROMAX) 250 MG tablet 2 tabs po qd x 1d, then 1 tab po qd x 4d (Patient not taking: Reported on 10/20/2014) 6 tablet 0  . predniSONE (DELTASONE) 20 MG tablet 2 tabs po qd x 5d, then 1 tab po qd x 5d (Patient not taking: Reported on 10/20/2014) 15 tablet 0   No facility-administered medications prior to visit.    Allergies  Allergen Reactions  . Amitriptyline Hcl     REACTION: nausea, elevated bp  . Ezetimibe     REACTION: chest pain, fatigue     ROS As per HPI  PE: Blood pressure 142/80, pulse 80, temperature 98 F (36.7 C), temperature source Oral, height 5' 10.5" (1.791 m), weight 319 lb (144.697 kg), SpO2 94 %. Gen: alert, A & O x 4, morbidly obese-appearing WM in NAD. CV: RRR, distant S1 and S2 LUNGS: CTA bilat, diminished BS in all fields, purse-lipped breathing pattern. Feet: pink, warm.  Pulses 1+ DP  and PT bilat. Monofilament testing reveals decreased sensation on entire plantar surface of both feet/all toes. No ulcerations.  A few small calluses on each foot.    LABS:  Lab Results  Component Value Date   TSH 1.38 04/29/2012   Lab Results  Component Value Date   WBC 8.6 04/29/2012   HGB 15.4 04/05/2013   HCT 45.4 04/05/2013   MCV 89.1 04/29/2012   PLT 287.0 04/29/2012   Lab Results  Component Value Date   CREATININE 0.96 10/12/2014   BUN 20 10/12/2014   NA 140 10/12/2014   K 4.0 10/12/2014   CL 101 10/12/2014   CO2 31 10/12/2014   Lab Results  Component Value Date   ALT 17 10/12/2014   AST 14 10/12/2014   ALKPHOS 77 10/12/2014   BILITOT 0.5 10/12/2014   Lab Results  Component Value Date   CHOL 215* 10/12/2014   Lab Results  Component Value Date   HDL 41.80 10/12/2014   Lab Results  Component Value Date   LDLCALC 142* 10/12/2014   Lab Results  Component Value Date   TRIG 155.0* 10/12/2014   Lab Results  Component Value Date   CHOLHDL 5 10/12/2014   Lab Results  Component Value Date   PSA 1.50 04/13/2014   PSA 4.91* 08/23/2010   PSA 1.97 (nl) 05/14/2008  **Pt's PSAs are followed by urology for hx of prostate cancer.  IMPRESSION AND PLAN:  1) HTN; The current medical regimen is effective;  continue present plan and medications.  2) Hyperlipidemia;  continue present plan and medications.  He cannot tolerate any higher dose of his statin.  3) COPD; he can no longer afford anoro ellipta.  Pt has drug savings card for spireva respimat so i started him on this today: 2 p  qd  4) Neuropathic pain: bilateral toes/plantar surfaces.  Increase neurontin to 600 mg tid gradually (instructions: Increasea your gabapentin by 1 tab every 5 days to a max of 2 tabs three times per day). Will check HbA1c and vit B12 today.  5) Chronic joint pains, primarily wt bearing joints: osteoarthritis + chronic gout.  He says he has not had a gout flare in years.  Continue allopurinol.  He uses oxycodone sparingly.  I changed his dose to oxycodone 10mg , 1-2 bid prn,#60.   6) Hx of CAD; has f/u with Dr. Harrington Challenger 10/27/15.    FOLLOW UP: Return in about 6 months (around 04/21/2015) for routine chronic illness f/u.

## 2014-10-22 ENCOUNTER — Encounter: Payer: Self-pay | Admitting: Family Medicine

## 2014-10-27 ENCOUNTER — Encounter: Payer: Medicare Other | Admitting: Internal Medicine

## 2014-12-05 ENCOUNTER — Telehealth: Payer: Self-pay | Admitting: Family Medicine

## 2014-12-05 MED ORDER — AZITHROMYCIN 250 MG PO TABS: ORAL_TABLET | ORAL | Status: DC

## 2014-12-05 NOTE — Telephone Encounter (Signed)
Pt. Called and is having a flair up of his COPD. He is requesting a Z-Pak RX be called into [facility] at [facility].

## 2014-12-05 NOTE — Telephone Encounter (Signed)
Per Dr. Anitra Lauth okay to send in Rx for Zpak, but advise pt that if this does not help he will need to schedule ov. Rx sent. Left message for pt to call back.

## 2014-12-06 NOTE — Telephone Encounter (Signed)
Pt advised and voiced understanding.   

## 2014-12-08 ENCOUNTER — Encounter: Payer: Medicare Other | Admitting: Internal Medicine

## 2015-02-07 ENCOUNTER — Encounter: Payer: Medicare Other | Admitting: Cardiology

## 2015-04-20 ENCOUNTER — Ambulatory Visit: Payer: Medicare Other | Admitting: Family Medicine

## 2015-04-23 ENCOUNTER — Ambulatory Visit: Payer: Medicare Other | Admitting: Family Medicine

## 2015-04-24 ENCOUNTER — Ambulatory Visit: Payer: Medicare Other | Admitting: Family Medicine

## 2015-04-24 DIAGNOSIS — Z0289 Encounter for other administrative examinations: Secondary | ICD-10-CM

## 2015-06-05 ENCOUNTER — Other Ambulatory Visit: Payer: Self-pay | Admitting: Family Medicine

## 2015-06-05 NOTE — Telephone Encounter (Signed)
RF request for albuterol LOV: 10/20/14 Next ov: None Last written: 10/20/14 250ml w/ 1RF

## 2015-06-13 ENCOUNTER — Telehealth: Payer: Self-pay | Admitting: Family Medicine

## 2015-06-13 MED ORDER — AZITHROMYCIN 250 MG PO TABS: ORAL_TABLET | ORAL | Status: DC

## 2015-06-13 MED ORDER — BENZONATATE 200 MG PO CAPS: 200.0000 mg | ORAL_CAPSULE | Freq: Three times a day (TID) | ORAL | Status: DC | PRN

## 2015-06-13 MED ORDER — PREDNISONE 20 MG PO TABS: ORAL_TABLET | ORAL | Status: DC

## 2015-06-13 NOTE — Telephone Encounter (Signed)
Please advise. Thanks.  

## 2015-06-13 NOTE — Telephone Encounter (Signed)
Patient is too weak to come in to the office. He is coughing so much that gets in to a full sweat. Can he get a Rx for a Zpack & something for his cough and/or something for congestion? Patient uses [facility]. If you need to talk to the patient he is home with his daughter.

## 2015-06-13 NOTE — Telephone Encounter (Signed)
I eRx'd prednisone taper, Z pack, and tessalon perles. Pls let pt know that I usually do not treat over the phone like this, but I will make an exception just this once.  From now on he needs to make arrangements to be seen in office before he gets to the point of being "too weak to come in".-thx

## 2015-06-14 NOTE — Telephone Encounter (Signed)
Pt was advised yesterday evening (06/13/15) and voiced understanding.

## 2015-06-24 ENCOUNTER — Other Ambulatory Visit: Payer: Self-pay | Admitting: Family Medicine

## 2015-06-26 NOTE — Telephone Encounter (Signed)
RF request for cyclobenzaprine LOV: 10/20/14 Next ov: None Last written: 10/20/14 #90 w/ 1RF  Please advise. Thanks.

## 2015-07-13 ENCOUNTER — Ambulatory Visit: Payer: Medicare Other | Admitting: Family Medicine

## 2015-10-23 ENCOUNTER — Ambulatory Visit
Admission: RE | Admit: 2015-10-23 | Discharge: 2015-10-23 | Disposition: A | Discharge status: Home / Self Care | Payer: Self-pay | Source: Ambulatory Visit | Attending: Family Medicine | Admitting: Family Medicine

## 2015-10-23 ENCOUNTER — Other Ambulatory Visit: Payer: Self-pay | Admitting: Family Medicine

## 2015-10-23 DIAGNOSIS — Z006 Encounter for examination for normal comparison and control in clinical research program: Secondary | ICD-10-CM

## 2015-11-07 ENCOUNTER — Other Ambulatory Visit: Payer: Self-pay | Admitting: Family Medicine

## 2015-11-07 NOTE — Telephone Encounter (Signed)
RF request for allopurinol LOV: 10/20/14 Next ov: None Last written: 10/20/14 #90 w/ 3Rf  Please advise. Thanks.

## 2015-11-07 NOTE — Telephone Encounter (Signed)
Will do RF of this med but pt needs to come in for 30 min f/u visit sometime in the next couple of months.-thx

## 2015-11-07 NOTE — Telephone Encounter (Signed)
Pt advised and voiced understanding.  Apt made for 11/13/15 at 11:30am.

## 2015-11-13 ENCOUNTER — Encounter: Payer: Self-pay | Admitting: Family Medicine

## 2015-11-13 ENCOUNTER — Ambulatory Visit (INDEPENDENT_AMBULATORY_CARE_PROVIDER_SITE_OTHER): Payer: Medicare Other | Admitting: Family Medicine

## 2015-11-13 VITALS — BP 125/80 | HR 71 | Temp 98.2°F | Resp 16 | Ht 70.5 in | Wt 302.8 lb

## 2015-11-13 DIAGNOSIS — E785 Hyperlipidemia, unspecified: Secondary | ICD-10-CM

## 2015-11-13 DIAGNOSIS — Z23 Encounter for immunization: Secondary | ICD-10-CM

## 2015-11-13 DIAGNOSIS — I10 Essential (primary) hypertension: Secondary | ICD-10-CM | POA: Diagnosis not present

## 2015-11-13 DIAGNOSIS — Z8546 Personal history of malignant neoplasm of prostate: Secondary | ICD-10-CM

## 2015-11-13 DIAGNOSIS — Z1159 Encounter for screening for other viral diseases: Secondary | ICD-10-CM

## 2015-11-13 DIAGNOSIS — J438 Other emphysema: Secondary | ICD-10-CM | POA: Diagnosis not present

## 2015-11-13 DIAGNOSIS — R7301 Impaired fasting glucose: Secondary | ICD-10-CM

## 2015-11-13 MED ORDER — BISOPROLOL-HYDROCHLOROTHIAZIDE 5-6.25 MG PO TABS: 1.0000 | ORAL_TABLET | Freq: Every morning | ORAL | Status: DC

## 2015-11-13 MED ORDER — PREDNISONE 20 MG PO TABS: ORAL_TABLET | ORAL | Status: DC

## 2015-11-13 MED ORDER — CITALOPRAM HYDROBROMIDE 40 MG PO TABS: 40.0000 mg | ORAL_TABLET | Freq: Every day | ORAL | Status: DC

## 2015-11-13 MED ORDER — ALLOPURINOL 300 MG PO TABS: 300.0000 mg | ORAL_TABLET | Freq: Every day | ORAL | Status: DC

## 2015-11-13 MED ORDER — GABAPENTIN 300 MG PO CAPS: ORAL_CAPSULE | ORAL | Status: DC

## 2015-11-13 MED ORDER — OXYCODONE HCL 10 MG PO TABS: ORAL_TABLET | ORAL | Status: DC

## 2015-11-13 MED ORDER — ZOLPIDEM TARTRATE 10 MG PO TABS: 10.0000 mg | ORAL_TABLET | Freq: Every evening | ORAL | Status: DC | PRN

## 2015-11-13 MED ORDER — ACYCLOVIR 400 MG PO TABS: ORAL_TABLET | ORAL | Status: DC

## 2015-11-13 MED ORDER — LISINOPRIL 20 MG PO TABS: ORAL_TABLET | ORAL | Status: DC

## 2015-11-13 MED ORDER — CYCLOBENZAPRINE HCL 10 MG PO TABS: 10.0000 mg | ORAL_TABLET | Freq: Every day | ORAL | Status: DC

## 2015-11-13 MED ORDER — FUROSEMIDE 40 MG PO TABS: 80.0000 mg | ORAL_TABLET | Freq: Every day | ORAL | Status: DC | PRN

## 2015-11-13 MED ORDER — TERAZOSIN HCL 2 MG PO CAPS: 2.0000 mg | ORAL_CAPSULE | Freq: Every day | ORAL | Status: DC

## 2015-11-13 NOTE — Progress Notes (Signed)
OFFICE VISIT  11/14/2015   CC:  Chief Complaint  Patient presents with  . Follow-up    Pt is not fasting.    HPI:    Patient is a 65 y.o. Caucasian male who presents for f/u HLD, HTN, COPD, ongoing tob dependence and chronic pain in his wt bearing joints from osteoarthritis and gout.  I last saw him about 1 yr ago, at which time we changed him to spireva respimat (anoro ellipta too $).  We also increased his neurontin to 600 mg tid gradually for his neuropathic pain in his feet.    He is in another COPD study--PEARL study (symbicort).  He is currently on pulmicort inhaler.  Says he has had to use the albut 7-10 times per day while on pulmicort.  Last few days has had worse DOE than usual.  He used albut via neb last night and he felt much better last night and today.  He feels sinus drainage/nasal congestion from allergies lately.  BP: home bp checks "fantastic".  So much so that he stopped his hctz.  He felt like he was having some symptomatic hypotension, so he stopped this med and has had no further episodes.  Feet neuropathic pain: says neurontin and prn oxycodone make this fairly well controlled.  Stopped atorvastatin 8 mo ago, sounds like it may have been due to pt thinking it was contributing to his musculoskeletal pain.  Past Medical History  Diagnosis Date  . Osteoarthrosis, unspecified whether generalized or localized, unspecified site     DDD  . Hyperlipidemia   . Depression     ?bipolar dx by psychiatrist?  . Anxiety   . 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 ED (erectile dysfunction)   . Obesities, morbid ([place])   . 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 Diverticulosis 2006  . Seizure disorder ([place]) 08-15-2011    Deja vu sensations: no w/u.  These stopped when neurontin 300mg  tid was started for a different reason.  . Gouty arthritis     Dr. Amil Amen  . Radiation     Pelvic--for prostate ca: 25 treatments/01/2011  .  Status post chemotherapy     10/2010 thru 06/2011  . Coronary atherosclerosis of unspecified type of vessel, native or graft 2005    MI, s/p PCI with stent (pt reports 20+ interventions in the past, most recent cat 6/08 showed patent stents).  Normal LV function.  Nuclear stress test NEG 8/09.  Marland Kitchen Hypertension   . Myocardial infarction ([place])     x 2 or 3  . COPD (chronic obstructive pulmonary disease) ([facility])     part of Breo-ellipta study  . OSA on CPAP     trying to get back on CPAP as of 07/2013  . Prostate cancer ([place]) 09/2010    Localized, high risk disease (Dr. Kimbrough/Grapey):  s/p 5 wks external beam radiation + seed boost, as well as hormone blockade x 1 yr.  . Hematochezia 09/2012    Hyperplastic polyp, diverticulosis, and internal hemorrhoids found on colonoscopy 10/2012  . Neuropathic pain of both feet ([place]) 2015/2016    Vit B12 and A1c normal 10/2014    Past Surgical History  Procedure Laterality Date  . Seed implants      02/24/2011/ in prostate  . Cardiac catheterization  23 caths    pt states he has 6 stents  . [facility]  yrs ago  [employer]  . Colonoscopy with propofol N/A 04/05/2013    Dr. Fuller Plan.  Polypectomy (hyperplastic--recall 10 yrs).  Moderate diverticulosis, +internal hemorrhoids.  No radiation proctitis.  Marland Kitchen Hot hemostasis N/A 04/05/2013    Procedure: HOT HEMOSTASIS (ARGON PLASMA COAGULATION/BICAP);  Surgeon: Ladene Artist, MD;  Location: [facility];  Service: Endoscopy;  Laterality: N/A;    Outpatient Prescriptions Prior to Visit  Medication Sig Dispense Refill  . albuterol (PROAIR HFA) 108 (90 BASE) MCG/ACT inhaler Inhale 2 puffs into the lungs every 4 (four) hours as needed. 3 Inhaler 1  . albuterol (PROVENTIL) (2.5 MG/3ML) 0.083% nebulizer solution USE ONE VIAL IN NEBULIZER EVERY 4 HOURS AS NEEDED 225 vial 1  . aspirin 325 MG tablet Take 325 mg by mouth daily.      . colchicine 0.6 MG tablet Take 1 tablet (0.6 mg total) by mouth 2 (two) times daily as  needed (gout flare ups). 60 tablet 6  . acyclovir (ZOVIRAX) 400 MG tablet 1 tab po tid x 5d for acute episode of fever blisters 90 tablet 1  . allopurinol (ZYLOPRIM) 300 MG tablet TAKE ONE TABLET BY MOUTH ONCE DAILY 90 tablet 0  . bisoprolol-hydrochlorothiazide (ZIAC) 5-6.25 MG per tablet Take 1 tablet by mouth every morning. 90 tablet 3  . citalopram (CELEXA) 40 MG tablet Take 1 tablet (40 mg total) by mouth at bedtime. 90 tablet 3  . cyclobenzaprine (FLEXERIL) 10 MG tablet TAKE ONE TABLET BY MOUTH ONCE DAILY 90 tablet 1  . furosemide (LASIX) 40 MG tablet Take 1 tablet (40 mg total) by mouth daily as needed. 90 tablet 1  . gabapentin (NEURONTIN) 300 MG capsule 2 tabs po tid 540 capsule 3  . lisinopril (PRINIVIL,ZESTRIL) 20 MG tablet 1 tab po bid 180 tablet 3  . Oxycodone HCl 10 MG TABS 1-2 tabs po bid prn pain 60 tablet 0  . terazosin (HYTRIN) 2 MG capsule Take 1 capsule (2 mg total) by mouth at bedtime. 90 capsule 3  . zolpidem (AMBIEN) 10 MG tablet Take 1 tablet (10 mg total) by mouth at bedtime as needed for sleep. 90 tablet 1  . atorvastatin (LIPITOR) 20 MG tablet Take 1 tablet (20 mg total) by mouth daily. (Patient not taking: Reported on 11/13/2015) 30 tablet 3  . azithromycin (ZITHROMAX) 250 MG tablet Take 2 tablets day one, one tablet days 2-5 (Patient not taking: Reported on 11/13/2015) 6 tablet 0  . benzonatate (TESSALON) 200 MG capsule Take 1 capsule (200 mg total) by mouth 3 (three) times daily as needed for cough. (Patient not taking: Reported on 11/13/2015) 30 capsule 0  . hydrochlorothiazide (HYDRODIURIL) 25 MG tablet Take 1 tablet (25 mg total) by mouth every morning. (Patient not taking: Reported on 11/13/2015) 90 tablet 3  . niacin 500 MG tablet Take 1 tablet (500 mg total) by mouth 2 (two) times daily with a meal. (Patient not taking: Reported on 11/13/2015) 180 tablet 1  . Potassium 99 MG TABS Take 1 tablet by mouth daily. Reported on 11/13/2015    . predniSONE (DELTASONE) 20 MG tablet 3  tabs po qd x 5d, then 2 tabs po qd x 5d, then 1 tab po qd x 5d (Patient not taking: Reported on 11/13/2015) 30 tablet 0  . Tiotropium Bromide Monohydrate (SPIRIVA RESPIMAT) 2.5 MCG/ACT AERS 2 inhalations once every day (Patient not taking: Reported on 11/13/2015) 3 Inhaler 3   No facility-administered medications prior to visit.    Allergies  Allergen Reactions  . Amitriptyline Hcl  REACTION: nausea, elevated bp  . Ezetimibe     REACTION: chest pain, fatigue    ROS As per HPI  PE: Blood pressure 125/80, pulse 71, temperature 98.2 F (36.8 C), temperature source Oral, resp. rate 16, height 5' 10.5" (1.791 m), weight 302 lb 12 oz (137.326 kg), SpO2 91 %. Body mass index is 42.81 kg/(m^2).  Gen: Alert, well appearing.  Patient is oriented to person, place, time, and situation. AFFECT: pleasant, lucid thought and speech. [id number]: no injection, icteris, swelling, or exudate.  EOMI, PERRLA. Mouth: lips without lesion/swelling.  Oral mucosa pink and moist. Oropharynx without erythema, exudate, or swelling.  CV: RRR, distant S1 and S2 LUNGS: diminished BS throughout all lung fields, but clear and without significant prolongation of expiratory phase.  Breathing nonlabored. EXT: no clubbing, cyanosis, or edema.    LABS:  Lab Results  Component Value Date   TSH 1.38 04/29/2012   Lab Results  Component Value Date   WBC 8.6 04/29/2012   HGB 15.4 04/05/2013   HCT 45.4 04/05/2013   MCV 89.1 04/29/2012   PLT 287.0 04/29/2012   Lab Results  Component Value Date   CREATININE 0.96 10/12/2014   BUN 20 10/12/2014   NA 140 10/12/2014   K 4.0 10/12/2014   CL 101 10/12/2014   CO2 31 10/12/2014   Lab Results  Component Value Date   ALT 17 10/12/2014   AST 14 10/12/2014   ALKPHOS 77 10/12/2014   BILITOT 0.5 10/12/2014   Lab Results  Component Value Date   CHOL 215* 10/12/2014   Lab Results  Component Value Date   HDL 41.80 10/12/2014   Lab Results  Component Value Date    LDLCALC 142* 10/12/2014   Lab Results  Component Value Date   TRIG 155.0* 10/12/2014   Lab Results  Component Value Date   CHOLHDL 5 10/12/2014   Lab Results  Component Value Date   PSA 1.50 04/13/2014   PSA 4.91* 08/23/2010   PSA 1.97 (nl) 05/14/2008   Lab Results  Component Value Date   HGBA1C 5.9 10/20/2014   Lab Results  Component Value Date   VITAMINB12 315 10/20/2014    IMPRESSION AND PLAN:  1) COPD: continue with pulmicort until he gets put on study drug.  Utilized nebulized albuterol when significantly SOB or wheezing: HFA is not working as well.  No sign of acute flare today but in previous 2-3 days he was struggling.  Therefore I did give a printed rx for a prednisone taper for him to hold and fill if sx's return substantially in the next few days. Prevnar 13 given today.  2) HTN: The current medical regimen is effective;  continue present plan and medications.  3) HLD: off statin due to side effect.  4) Chronic osteoarthritic pain; pt uses narcotic pain med sparingly: RF'd his oxycodone 10mg  tabs today, #60.  5) Hx of prostate cancer: he no longer follows up with his urologist.  Will check PSA.  Wrote orders for future labs for pt to get at [facility] lab when fasting: CMET, FLP, HbA1c, Hep C antibody (screening), and PSA.  RF'd all of pt's chronic medications today.  An After Visit Summary was printed and given to the patient.  FOLLOW UP: Return in about 6 months (around 05/15/2016) for medicare AWV: also, schedule pt for fasting lab work at [facility].  Signed:  Crissie Sickles, MD           11/14/2015

## 2015-11-13 NOTE — Progress Notes (Signed)
Pre visit review using our clinic review tool, if applicable. No additional management support is needed unless otherwise documented below in the visit note. 

## 2015-11-14 ENCOUNTER — Other Ambulatory Visit: Payer: Medicare Other

## 2015-11-15 ENCOUNTER — Encounter: Payer: Self-pay | Admitting: Family Medicine

## 2015-11-15 ENCOUNTER — Other Ambulatory Visit (INDEPENDENT_AMBULATORY_CARE_PROVIDER_SITE_OTHER): Payer: Medicare Other

## 2015-11-15 DIAGNOSIS — E785 Hyperlipidemia, unspecified: Secondary | ICD-10-CM | POA: Diagnosis not present

## 2015-11-15 DIAGNOSIS — I10 Essential (primary) hypertension: Secondary | ICD-10-CM | POA: Diagnosis not present

## 2015-11-15 DIAGNOSIS — Z8546 Personal history of malignant neoplasm of prostate: Secondary | ICD-10-CM | POA: Diagnosis not present

## 2015-11-15 DIAGNOSIS — R7301 Impaired fasting glucose: Secondary | ICD-10-CM | POA: Diagnosis not present

## 2015-11-15 DIAGNOSIS — Z1159 Encounter for screening for other viral diseases: Secondary | ICD-10-CM

## 2015-11-15 LAB — PSA: PSA: 6.15 ng/mL — ABNORMAL HIGH (ref 0.10–4.00)

## 2015-11-15 LAB — COMPREHENSIVE METABOLIC PANEL
ALT: 13 U/L (ref 0–53)
AST: 15 U/L (ref 0–37)
Albumin: 4 g/dL (ref 3.5–5.2)
Alkaline Phosphatase: 77 U/L (ref 39–117)
BUN: 16 mg/dL (ref 6–23)
CALCIUM: 9.2 mg/dL (ref 8.4–10.5)
CHLORIDE: 99 meq/L (ref 96–112)
CO2: 31 meq/L (ref 19–32)
CREATININE: 1 mg/dL (ref 0.40–1.50)
GFR: 79.43 mL/min (ref >60.00)
Glucose, Bld: 105 mg/dL — ABNORMAL HIGH (ref 70–99)
Potassium: 3.8 mEq/L (ref 3.5–5.1)
SODIUM: 141 meq/L (ref 135–145)
Total Bilirubin: 0.5 mg/dL (ref 0.2–1.2)
Total Protein: 6.4 g/dL (ref 6.0–8.3)

## 2015-11-15 LAB — LIPID PANEL
Cholesterol: 201 mg/dL — ABNORMAL HIGH (ref 0–200)
HDL: 35.9 mg/dL — ABNORMAL LOW
LDL Cholesterol: 135 mg/dL — ABNORMAL HIGH (ref 0–99)
NonHDL: 165.16
Total CHOL/HDL Ratio: 6
Triglycerides: 153 mg/dL — ABNORMAL HIGH (ref 0.0–149.0)
VLDL: 30.6 mg/dL (ref 0.0–40.0)

## 2015-11-15 LAB — HEMOGLOBIN A1C: Hgb A1c MFr Bld: 6.1 % (ref 4.6–6.5)

## 2015-11-15 LAB — HEPATITIS C ANTIBODY: HCV Ab: NEGATIVE

## 2015-11-16 ENCOUNTER — Other Ambulatory Visit: Payer: Self-pay | Admitting: *Deleted

## 2015-11-16 ENCOUNTER — Telehealth: Payer: Self-pay | Admitting: *Deleted

## 2015-11-16 MED ORDER — ATORVASTATIN CALCIUM 20 MG PO TABS
20.0000 mg | ORAL_TABLET | Freq: Every day | ORAL | Status: DC
Start: 2015-11-16 — End: 2016-10-30

## 2015-11-16 NOTE — Telephone Encounter (Signed)
Atorvastatin sent to patient pharmacy per Dr Anitra Lauth.

## 2015-11-16 NOTE — Telephone Encounter (Signed)
Patient called states he scheduled appt with Dr [name] for 11/19/2015 . Copy of patient labs faxed to Dr Cy Blamer office for review.

## 2015-12-14 ENCOUNTER — Other Ambulatory Visit (HOSPITAL_COMMUNITY): Payer: Self-pay | Admitting: Urology

## 2015-12-14 DIAGNOSIS — C61 Malignant neoplasm of prostate: Secondary | ICD-10-CM | POA: Diagnosis not present

## 2015-12-24 ENCOUNTER — Encounter: Payer: Self-pay | Admitting: Family Medicine

## 2015-12-25 ENCOUNTER — Encounter (HOSPITAL_COMMUNITY)
Admission: RE | Admit: 2015-12-25 | Discharge: 2015-12-25 | Disposition: A | Discharge status: Home / Self Care | Payer: Medicare Other | Source: Ambulatory Visit | Attending: Urology | Admitting: Urology

## 2015-12-25 DIAGNOSIS — C61 Malignant neoplasm of prostate: Secondary | ICD-10-CM

## 2015-12-25 MED ORDER — TECHNETIUM TC 99M MEDRONATE IV KIT
25.0000 | PACK | Freq: Once | INTRAVENOUS | Status: AC | PRN
  Administered 2015-12-25: 25.7 via INTRAVENOUS

## 2016-03-02 DIAGNOSIS — A6920 Lyme disease, unspecified: Secondary | ICD-10-CM | POA: Diagnosis not present

## 2016-03-02 DIAGNOSIS — R5381 Other malaise: Secondary | ICD-10-CM | POA: Diagnosis not present

## 2016-03-11 ENCOUNTER — Other Ambulatory Visit: Payer: Self-pay | Admitting: Family Medicine

## 2016-03-24 ENCOUNTER — Ambulatory Visit: Payer: Medicare Other | Admitting: Family Medicine

## 2016-03-26 ENCOUNTER — Ambulatory Visit (INDEPENDENT_AMBULATORY_CARE_PROVIDER_SITE_OTHER): Payer: Medicare Other | Admitting: Family Medicine

## 2016-03-26 ENCOUNTER — Encounter: Payer: Self-pay | Admitting: Family Medicine

## 2016-03-26 VITALS — BP 132/83 | HR 82 | Temp 97.7°F | Resp 18 | Wt 297.8 lb

## 2016-03-26 DIAGNOSIS — G894 Chronic pain syndrome: Secondary | ICD-10-CM | POA: Diagnosis not present

## 2016-03-26 DIAGNOSIS — B354 Tinea corporis: Secondary | ICD-10-CM

## 2016-03-26 DIAGNOSIS — Z23 Encounter for immunization: Secondary | ICD-10-CM | POA: Diagnosis not present

## 2016-03-26 DIAGNOSIS — G4733 Obstructive sleep apnea (adult) (pediatric): Secondary | ICD-10-CM

## 2016-03-26 DIAGNOSIS — A6920 Lyme disease, unspecified: Secondary | ICD-10-CM | POA: Diagnosis not present

## 2016-03-26 DIAGNOSIS — C61 Malignant neoplasm of prostate: Secondary | ICD-10-CM

## 2016-03-26 MED ORDER — DOXYCYCLINE HYCLATE 100 MG PO TABS: 100.0000 mg | ORAL_TABLET | Freq: Two times a day (BID) | ORAL | 0 refills | Status: DC

## 2016-03-26 MED ORDER — AMOXICILLIN 500 MG PO CAPS: ORAL_CAPSULE | ORAL | 0 refills | Status: DC

## 2016-03-26 MED ORDER — OXYCODONE HCL 10 MG PO TABS: ORAL_TABLET | ORAL | 0 refills | Status: DC

## 2016-03-26 NOTE — Progress Notes (Signed)
OFFICE VISIT  03/26/2016   CC:  Chief Complaint  Patient presents with  . Pain    joint pain, rash    HPI:    Patient is a 65 y.o. Caucasian male who presents for 6 mo hx of left leg pain from knee cap to femur up into hip.  Worse x the last 1 mo.  Also around this time getting severe HA's all over head and down back of neck.  He then noted a rash on underside of R upper arm.  He doesn't recall any specific tick bite.   He went to an UC and got doxycycline x 10 day course (aug 20-30).  Labs were done and he was told all were ok. He says the doxy made his joint pain and HAs go away, and the rash on R arm has cleared up some.  About a week after finishing doxy the pain returned. He also was having some loose stools and these cleared up with the doxy. Was having mild subjective fever and this has not returned.  Chronic musculoskeletal pain/osteoarthritis and gouty arthritis: he is minimizing NSAIDs. Takes oxycodone 10mg , 1-2 bid prn pain, fairly sparingly.  He ran out of these pills a couple days ago and asks for new rx.  Prostate ca: most recent f/u with urologist, PSA was slightly up so bone scan was done and a few areas on T spine and L pelvic region near SI jt lit up, so he is supposed to get plain films of pelvis and T spine to see if these areas correlate with areas of arthritis.  He says he plans on doing these x-rays this week.  OSA: unclear hx; says dx'd many years ago but apparently by a provider who is no longer in practice and patient can't even recall name, says he bought a CPAP machine at the time but he no longer has supplies and it is unclear whether or not the machine even works.  He says the people he bought the machine from have since gone out of business.    Past Medical History:  Diagnosis Date  . Anxiety   . 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 COPD (chronic obstructive pulmonary disease) ([place])    "pearl study" pt thinks he is  on Symbicort  . Coronary atherosclerosis of unspecified type of vessel, native or graft 2005   MI, s/p PCI with stent (pt reports 20+ interventions in the past, most recent cat 6/08 showed patent stents).  Normal LV function.  Nuclear stress test NEG 8/09.  . Depression    ?bipolar dx by psychiatrist?  . Diverticulosis 2006  . ED (erectile dysfunction)   . Gouty arthritis    Dr. Amil Amen  . Hematochezia 09/2012   Hyperplastic polyp, diverticulosis, and internal hemorrhoids found on colonoscopy 10/2012  . Hyperlipidemia   . Hypertension   . 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 Myocardial infarction ([place])    x 2 or 3  . Neuropathic pain of both feet ([place]) 2015/2016   Vit B12 and A1c normal 10/2014  . Obesities, morbid ([place])   . OSA on CPAP    trying to get back on CPAP as of 07/2013  . Osteoarthrosis, unspecified whether generalized or localized, unspecified site    DDD  . Prediabetes 2016/17  . Prostate cancer ([place]) 09/2010   Localized, high risk disease (Dr. Kimbrough/Grapey):  s/p 5 wks ext bm rad + seed boost,  as well as hormone blockade x 1 yr.  Biochemical recurrence 11/2015--urologist did bone scan and spots on T spine and L iliac region came up, pt needs plain films of pelvis/T spine to see if DJD correlate present.  He'll eventually need to resume androgen depriv therapy.  . Radiation    Pelvic--for prostate ca: 25 treatments/01/2011  . Seizure disorder ([place]) 08-15-2011   Deja vu sensations: no w/u.  These stopped when neurontin 300mg  tid was started for a different reason.  . Status post chemotherapy    10/2010 thru 06/2011    Past Surgical History:  Procedure Laterality Date  . CARDIAC CATHETERIZATION  23 caths   pt states he has 6 stents  . COLONOSCOPY WITH PROPOFOL N/A 04/05/2013   Dr. Fuller Plan.  Polypectomy (hyperplastic--recall 10 yrs).  Moderate diverticulosis, +internal hemorrhoids.  No radiation proctitis.  Marland Kitchen HERNIA REPAIR  yrs ago   [employer]  . HOT HEMOSTASIS  N/A 04/05/2013   Procedure: HOT HEMOSTASIS (ARGON PLASMA COAGULATION/BICAP);  Surgeon: Ladene Artist, MD;  Location: [facility];  Service: Endoscopy;  Laterality: N/A;  . seed implants     02/24/2011/ in prostate    Outpatient Medications Prior to Visit  Medication Sig Dispense Refill  . acyclovir (ZOVIRAX) 400 MG tablet 1 tab po tid x 5d for acute episode of fever blisters 90 tablet 1  . albuterol (PROAIR HFA) 108 (90 BASE) MCG/ACT inhaler Inhale 2 puffs into the lungs every 4 (four) hours as needed. 3 Inhaler 1  . albuterol (PROVENTIL) (2.5 MG/3ML) 0.083% nebulizer solution USE ONE VIAL IN NEBULIZER EVERY 4 HOURS AS NEEDED 225 vial 1  . allopurinol (ZYLOPRIM) 300 MG tablet Take 1 tablet (300 mg total) by mouth daily. 90 tablet 3  . aspirin 325 MG tablet Take 325 mg by mouth daily.      . bisoprolol-hydrochlorothiazide (ZIAC) 5-6.25 MG tablet Take 1 tablet by mouth every morning. 90 tablet 3  . Budesonide (PULMICORT FLEXHALER) 90 MCG/ACT inhaler Inhale 2 puffs into the lungs 2 (two) times daily.    . citalopram (CELEXA) 40 MG tablet Take 1 tablet (40 mg total) by mouth at bedtime. 90 tablet 3  . colchicine 0.6 MG tablet Take 1 tablet (0.6 mg total) by mouth 2 (two) times daily as needed (gout flare ups). 60 tablet 6  . cyclobenzaprine (FLEXERIL) 10 MG tablet Take 1 tablet (10 mg total) by mouth daily. 90 tablet 1  . furosemide (LASIX) 40 MG tablet Take 2 tablets (80 mg total) by mouth daily as needed. 180 tablet 1  . gabapentin (NEURONTIN) 300 MG capsule 2 tabs po tid 540 capsule 3  . hydrochlorothiazide (HYDRODIURIL) 25 MG tablet TAKE ONE TABLET BY MOUTH IN THE MORNING 90 tablet 0  . lisinopril (PRINIVIL,ZESTRIL) 20 MG tablet 1 tab po bid 180 tablet 3  . predniSONE (DELTASONE) 20 MG tablet 3 tabs po qd x 5d, then 2 tab po qd x 5d, then 1 tab po qd x 5d 30 tablet 0  . Probiotic Product (PROBIOTIC PO) Take 1 capsule by mouth daily.    Marland Kitchen terazosin (HYTRIN) 2 MG capsule Take 1 capsule (2 mg  total) by mouth at bedtime. 90 capsule 3  . zolpidem (AMBIEN) 10 MG tablet Take 1 tablet (10 mg total) by mouth at bedtime as needed for sleep. 90 tablet 1  . Oxycodone HCl 10 MG TABS 1-2 tabs po bid prn pain 60 tablet 0  . atorvastatin (LIPITOR) 20 MG tablet Take 1 tablet (20  mg total) by mouth daily. (Patient not taking: Reported on 03/26/2016) 90 tablet 3   No facility-administered medications prior to visit.     Allergies  Allergen Reactions  . Amitriptyline Hcl     REACTION: nausea, elevated bp  . Ezetimibe     REACTION: chest pain, fatigue    ROS As per HPI  PE: Blood pressure 132/83, pulse 82, temperature 97.7 F (36.5 C), temperature source Oral, resp. rate 18, weight 297 lb 12.8 oz (135.1 kg), SpO2 93 %. Gen: Alert, well appearing, morbidly obese WM in NAD.  Patient is oriented to person, place, time, and situation. SKIN: Right upper arm medial aspect with oval of mildly hyperpigmented skin with a fine flakiness, central clearing.  No erythema or tenderness.   LABS:  Lab Results  Component Value Date   TSH 1.38 04/29/2012   Lab Results  Component Value Date   WBC 8.6 04/29/2012   HGB 15.4 04/05/2013   HCT 45.4 04/05/2013   MCV 89.1 04/29/2012   PLT 287.0 04/29/2012   Lab Results  Component Value Date   CREATININE 1.00 11/15/2015   BUN 16 11/15/2015   NA 141 11/15/2015   K 3.8 11/15/2015   CL 99 11/15/2015   CO2 31 11/15/2015   Lab Results  Component Value Date   ALT 13 11/15/2015   AST 15 11/15/2015   ALKPHOS 77 11/15/2015   BILITOT 0.5 11/15/2015   Lab Results  Component Value Date   CHOL 201 (H) 11/15/2015   Lab Results  Component Value Date   HDL 35.90 (L) 11/15/2015   Lab Results  Component Value Date   LDLCALC 135 (H) 11/15/2015   Lab Results  Component Value Date   TRIG 153.0 (H) 11/15/2015   Lab Results  Component Value Date   CHOLHDL 6 11/15/2015   Lab Results  Component Value Date   PSA 6.15 (H) 11/15/2015   PSA 1.50  04/13/2014   PSA 4.91 (H) 08/23/2010    Lab Results  Component Value Date   HGBA1C 6.1 11/15/2015    IMPRESSION AND PLAN:  1) Lyme disease: as is often the case, patient's symptoms are very difficult to attribute to any one pathologic process, plus his rash on R arm does appear to be ringworm.  The patient is fairly convinced he has lyme dz b/c of the improvement in sx's he directly relates to taking the doxycycline. Will recheck lyme labs here today.  He is unable to give any accurate info in order for [place] to get the UC's records from [place].  I agreed to go ahead and treat for 3 more weeks, pt preferred amoxil 500 mg tid regimen due to cost of doxycycline.  I rx'd 21 day course of amoxil.  2) Tinea corporis: recommended pt apply otc lamisil cream bid until rash gone for 3 days.  3) Chronic osteoarthritic pain/chronic gouty arthritis pain: renewed pt's oxycodone 10mg  rx today, 1-2 tabs po bid prn, #60.  4) Prostate ca: recent biochemical recurrence, with bone scan potentially positive for bone mets. Today I emphasized the importance of getting the plain films that Dr. [name] ordered.  5) OSA: it appears we'll be unable to help him with his equipment issues.  He'll need new pulm referral. We'll discuss this at next scheduled f/u in a couple of months.  An After Visit Summary was printed and given to the patient.  FOLLOW UP: Return for keep appt for 05/15/16.  Signed:  Crissie Sickles, MD  03/26/2016     

## 2016-03-26 NOTE — Progress Notes (Signed)
Pre visit review using our clinic review tool, if applicable. No additional management support is needed unless otherwise documented below in the visit note. 

## 2016-03-26 NOTE — Patient Instructions (Signed)
Apply otc generic lamisil cream to right arm rash until rash has completely resolved for 3d.

## 2016-03-27 LAB — LYME AB/WESTERN BLOT REFLEX: B burgdorferi Ab IgG+IgM: <0.9 {index}

## 2016-05-15 ENCOUNTER — Ambulatory Visit: Payer: Medicare Other | Admitting: Family Medicine

## 2016-05-19 ENCOUNTER — Ambulatory Visit: Payer: Medicare Other

## 2016-05-19 NOTE — Progress Notes (Deleted)
Subjective:   Scott Rollins is a 65 y.o. male who presents for an Initial Medicare Annual Wellness Visit.  The Patient was informed that the wellness visit is to identify future health risk and educate and initiate measures that can reduce risk for increased disease through the lifespan.   Describes health as fair, good or great?   Review of Systems  No ROS.  Medicare Wellness Visit.    Sleep patterns:    Home Safety/Smoke Alarms:   Living environment; residence and Firearm Safety:  Seat Belt Safety/Bike Helmet:    Screening Exams:  Eye Exam-  Dental-   Male:   [facility]-Colonoscopy 04/05/13, normal (polyp). Recall 10 years.  PSA-11/15/15, 6.15. Followed by Dr. [name].    Objective:    There were no vitals filed for this visit. There is no height or weight on file to calculate BMI.  Current Medications (verified) Outpatient Encounter Prescriptions as of 05/19/2016  Medication Sig  . acyclovir (ZOVIRAX) 400 MG tablet 1 tab po tid x 5d for acute episode of fever blisters  . albuterol (PROAIR HFA) 108 (90 BASE) MCG/ACT inhaler Inhale 2 puffs into the lungs every 4 (four) hours as needed.  Marland Kitchen albuterol (PROVENTIL) (2.5 MG/3ML) 0.083% nebulizer solution USE ONE VIAL IN NEBULIZER EVERY 4 HOURS AS NEEDED  . allopurinol (ZYLOPRIM) 300 MG tablet Take 1 tablet (300 mg total) by mouth daily.  Marland Kitchen amoxicillin (AMOXIL) 500 MG capsule 1 tab po tid x 21 days  . aspirin 325 MG tablet Take 325 mg by mouth daily.    Marland Kitchen atorvastatin (LIPITOR) 20 MG tablet Take 1 tablet (20 mg total) by mouth daily. (Patient not taking: Reported on 03/26/2016)  . bisoprolol-hydrochlorothiazide (ZIAC) 5-6.25 MG tablet Take 1 tablet by mouth every morning.  . Budesonide (PULMICORT FLEXHALER) 90 MCG/ACT inhaler Inhale 2 puffs into the lungs 2 (two) times daily.  . citalopram (CELEXA) 40 MG tablet Take 1 tablet (40 mg total) by mouth at bedtime.  . colchicine 0.6 MG tablet Take 1 tablet (0.6 mg total) by mouth 2  (two) times daily as needed (gout flare ups).  . cyclobenzaprine (FLEXERIL) 10 MG tablet Take 1 tablet (10 mg total) by mouth daily.  . furosemide (LASIX) 40 MG tablet Take 2 tablets (80 mg total) by mouth daily as needed.  . gabapentin (NEURONTIN) 300 MG capsule 2 tabs po tid  . hydrochlorothiazide (HYDRODIURIL) 25 MG tablet TAKE ONE TABLET BY MOUTH IN THE MORNING  . lisinopril (PRINIVIL,ZESTRIL) 20 MG tablet 1 tab po bid  . Oxycodone HCl 10 MG TABS 1-2 tabs po bid prn pain  . predniSONE (DELTASONE) 20 MG tablet 3 tabs po qd x 5d, then 2 tab po qd x 5d, then 1 tab po qd x 5d  . Probiotic Product (PROBIOTIC PO) Take 1 capsule by mouth daily.  Marland Kitchen terazosin (HYTRIN) 2 MG capsule Take 1 capsule (2 mg total) by mouth at bedtime.  Marland Kitchen zolpidem (AMBIEN) 10 MG tablet Take 1 tablet (10 mg total) by mouth at bedtime as needed for sleep.   No facility-administered encounter medications on file as of 05/19/2016.     Allergies (verified) Amitriptyline hcl and Ezetimibe   History: Past Medical History:  Diagnosis Date  . Anxiety   . 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 COPD (chronic obstructive pulmonary disease) ([place])    "pearl study" pt thinks he is on Symbicort  . Coronary atherosclerosis of unspecified type of vessel,  native or graft 2005   MI, s/p PCI with stent (pt reports 20+ interventions in the past, most recent cat 6/08 showed patent stents).  Normal LV function.  Nuclear stress test NEG 8/09.  . Depression    ?bipolar dx by psychiatrist?  . Diverticulosis 2006  . ED (erectile dysfunction)   . Gouty arthritis    Dr. Amil Amen  . Hematochezia 09/2012   Hyperplastic polyp, diverticulosis, and internal hemorrhoids found on colonoscopy 10/2012  . Hyperlipidemia   . Hypertension   . 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 Myocardial infarction    x 2 or 3  . Neuropathic pain of both feet ([place]) 2015/2016   Vit B12 and A1c normal 10/2014  .  Obesities, morbid ([place])   . OSA on CPAP    trying to get back on CPAP as of 07/2013  . Osteoarthrosis, unspecified whether generalized or localized, unspecified site    DDD  . Prediabetes 2016/17  . Prostate cancer ([place]) 09/2010   Localized, high risk disease (Dr. Kimbrough/Grapey):  s/p 5 wks ext bm rad + seed boost, as well as hormone blockade x 1 yr.  Biochemical recurrence 11/2015--urologist did bone scan and spots on T spine and L iliac region came up, pt needs plain films of pelvis/T spine to see if DJD correlate present.  He'll eventually need to resume androgen depriv therapy.  . Radiation    Pelvic--for prostate ca: 25 treatments/01/2011  . Seizure disorder ([place]) 08-15-2011   Deja vu sensations: no w/u.  These stopped when neurontin 300mg  tid was started for a different reason.  . Status post chemotherapy    10/2010 thru 06/2011   Past Surgical History:  Procedure Laterality Date  . CARDIAC CATHETERIZATION  23 caths   pt states he has 6 stents  . COLONOSCOPY WITH PROPOFOL N/A 04/05/2013   Dr. Fuller Plan.  Polypectomy (hyperplastic--recall 10 yrs).  Moderate diverticulosis, +internal hemorrhoids.  No radiation proctitis.  Marland Kitchen HERNIA REPAIR  yrs ago   [employer]  . HOT HEMOSTASIS N/A 04/05/2013   Procedure: HOT HEMOSTASIS (ARGON PLASMA COAGULATION/BICAP);  Surgeon: Ladene Artist, MD;  Location: [facility];  Service: Endoscopy;  Laterality: N/A;  . seed implants     02/24/2011/ in prostate   Family History  Problem Relation Age of Onset  . Arthritis Mother   . Heart disease Father   . Bipolar disorder Father    Social History   Occupational History  . [employer]   Social History Main Topics  . Smoking status: Current Every Day Smoker    Packs/day: 1.00    Years: 46.00    Types: Cigarettes    Last attempt to quit: 07/18/2010  . Smokeless tobacco: Never Used  . Alcohol use Yes     Comment: occasional  . Drug use: No  . Sexual activity: Not on file   Tobacco  Counseling Ready to quit: Not Answered Counseling given: Not Answered   Activities of Daily Living No flowsheet data found.  Immunizations and Health Maintenance Immunization History  Administered Date(s) Administered  . Influenza Split 08/21/2011, 04/29/2012  . Influenza Whole 04/16/2009, 08/21/2010  . Influenza, High Dose Seasonal PF 03/26/2016  . Influenza,inj,Quad PF,36+ Mos 04/22/2013, 04/22/2013, 04/13/2014  . Pneumococcal Conjugate-13 11/13/2015  . Pneumococcal Polysaccharide-23 02/12/2004, 08/21/2011  . Td 07/15/2007  . Zoster 12/09/2012   There are no preventive care reminders to display for this patient.  Patient Care Team: [name], MD as PCP -  General Rana Snare, MD as Consulting Physician (Urology) Hennie Duos, MD as Consulting Physician (Rheumatology) Ladene Artist, MD as Consulting Physician (Gastroenterology)  Indicate any recent Medical Services you may have received from other than [facility] providers in the past year (date may be approximate).    Assessment:   This is a routine wellness examination for [place]. Physical assessment deferred to PCP.   Hearing/Vision screen No exam data present  Dietary issues and exercise activities discussed:   Diet (meal preparation, eat out, water intake, caffeinated beverages, dairy products, fruits and vegetables):   Breakfast: Lunch:  Dinner:      Goals    None     Depression Screen PHQ 2/9 Scores 11/13/2015  PHQ - 2 Score 3  PHQ- 9 Score 6    Fall Risk Fall Risk  11/13/2015  Falls in the past year? No    Cognitive Function:        Screening Tests Health Maintenance  Topic Date Due  . PNA vac Low Risk Adult (2 of 2 - PPSV23) 11/12/2016  . TETANUS/TDAP  07/14/2017  . COLONOSCOPY  04/06/2023  . INFLUENZA VACCINE  Completed  . ZOSTAVAX  Completed  . Hepatitis C Screening  Completed        Plan:     Continue to eat heart healthy diet (full of fruits, vegetables, whole grains, lean  protein, water--limit salt, fat, and sugar intake) and increase physical activity as tolerated.  Continue doing brain stimulating activities (puzzles, reading, adult coloring books, staying active) to keep memory sharp.    During the course of the visit Delonta was educated and counseled about the following appropriate screening and preventive services:   Vaccines to include Pneumoccal, Influenza, Hepatitis B, Td, Zostavax, HCV  Colorectal cancer screening  Cardiovascular disease screening  Diabetes screening  Glaucoma screening  Nutrition counseling  Prostate cancer screening  Smoking cessation counseling  Patient Instructions (the written plan) were given to the patient.   Gerilyn Nestle, RN   05/19/2016

## 2016-05-19 NOTE — Progress Notes (Deleted)
Pre visit review using our clinic review tool, if applicable. No additional management support is needed unless otherwise documented below in the visit note. 

## 2016-05-23 ENCOUNTER — Ambulatory Visit: Payer: Medicare Other

## 2016-05-23 NOTE — Progress Notes (Deleted)
Subjective:   Scott Rollins is a 65 y.o. male who presents for an Initial Medicare Annual Wellness Visit.  The Patient was informed that the wellness visit is to identify future health risk and educate and initiate measures that can reduce risk for increased disease through the lifespan.   Describes health as fair, good or great?   Review of Systems  No ROS.  Medicare Wellness Visit.    Sleep patterns: CPAP?  Home Safety/Smoke Alarms:   Living environment; residence and Firearm Safety:  [place] Safety/Bike Helmet:    Counseling:   Eye Exam-  Dental-  Male:   [facility]-Colonoscopy, 04/05/13. Moderate diverticulosis. Recall 10 years.  PSA-11/15/15, 6.15. Followed by Urology  Bone Scan-12/25/15    Objective:    There were no vitals filed for this visit. There is no height or weight on file to calculate BMI.  Current Medications (verified) Outpatient Encounter Prescriptions as of 05/23/2016  Medication Sig  . acyclovir (ZOVIRAX) 400 MG tablet 1 tab po tid x 5d for acute episode of fever blisters  . albuterol (PROAIR HFA) 108 (90 BASE) MCG/ACT inhaler Inhale 2 puffs into the lungs every 4 (four) hours as needed.  Marland Kitchen albuterol (PROVENTIL) (2.5 MG/3ML) 0.083% nebulizer solution USE ONE VIAL IN NEBULIZER EVERY 4 HOURS AS NEEDED  . allopurinol (ZYLOPRIM) 300 MG tablet Take 1 tablet (300 mg total) by mouth daily.  Marland Kitchen amoxicillin (AMOXIL) 500 MG capsule 1 tab po tid x 21 days  . aspirin 325 MG tablet Take 325 mg by mouth daily.    Marland Kitchen atorvastatin (LIPITOR) 20 MG tablet Take 1 tablet (20 mg total) by mouth daily. (Patient not taking: Reported on 03/26/2016)  . bisoprolol-hydrochlorothiazide (ZIAC) 5-6.25 MG tablet Take 1 tablet by mouth every morning.  . Budesonide (PULMICORT FLEXHALER) 90 MCG/ACT inhaler Inhale 2 puffs into the lungs 2 (two) times daily.  . citalopram (CELEXA) 40 MG tablet Take 1 tablet (40 mg total) by mouth at bedtime.  . colchicine 0.6 MG tablet Take 1 tablet  (0.6 mg total) by mouth 2 (two) times daily as needed (gout flare ups).  . cyclobenzaprine (FLEXERIL) 10 MG tablet Take 1 tablet (10 mg total) by mouth daily.  . furosemide (LASIX) 40 MG tablet Take 2 tablets (80 mg total) by mouth daily as needed.  . gabapentin (NEURONTIN) 300 MG capsule 2 tabs po tid  . hydrochlorothiazide (HYDRODIURIL) 25 MG tablet TAKE ONE TABLET BY MOUTH IN THE MORNING  . lisinopril (PRINIVIL,ZESTRIL) 20 MG tablet 1 tab po bid  . Oxycodone HCl 10 MG TABS 1-2 tabs po bid prn pain  . predniSONE (DELTASONE) 20 MG tablet 3 tabs po qd x 5d, then 2 tab po qd x 5d, then 1 tab po qd x 5d  . Probiotic Product (PROBIOTIC PO) Take 1 capsule by mouth daily.  Marland Kitchen terazosin (HYTRIN) 2 MG capsule Take 1 capsule (2 mg total) by mouth at bedtime.  Marland Kitchen zolpidem (AMBIEN) 10 MG tablet Take 1 tablet (10 mg total) by mouth at bedtime as needed for sleep.   No facility-administered encounter medications on file as of 05/23/2016.     Allergies (verified) Amitriptyline hcl and Ezetimibe   History: Past Medical History:  Diagnosis Date  . Anxiety   . 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 COPD (chronic obstructive pulmonary disease) ([place])    "pearl study" pt thinks he is on Symbicort  . Coronary atherosclerosis of unspecified type of vessel,  native or graft 2005   MI, s/p PCI with stent (pt reports 20+ interventions in the past, most recent cat 6/08 showed patent stents).  Normal LV function.  Nuclear stress test NEG 8/09.  . Depression    ?bipolar dx by psychiatrist?  . Diverticulosis 2006  . ED (erectile dysfunction)   . Gouty arthritis    Dr. Amil Amen  . Hematochezia 09/2012   Hyperplastic polyp, diverticulosis, and internal hemorrhoids found on colonoscopy 10/2012  . Hyperlipidemia   . Hypertension   . 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 Myocardial infarction    x 2 or 3  . Neuropathic pain of both feet ([place]) 2015/2016   Vit B12  and A1c normal 10/2014  . Obesities, morbid ([place])   . OSA on CPAP    trying to get back on CPAP as of 07/2013  . Osteoarthrosis, unspecified whether generalized or localized, unspecified site    DDD  . Prediabetes 2016/17  . Prostate cancer ([place]) 09/2010   Localized, high risk disease (Dr. Kimbrough/Grapey):  s/p 5 wks ext bm rad + seed boost, as well as hormone blockade x 1 yr.  Biochemical recurrence 11/2015--urologist did bone scan and spots on T spine and L iliac region came up, pt needs plain films of pelvis/T spine to see if DJD correlate present.  He'll eventually need to resume androgen depriv therapy.  . Radiation    Pelvic--for prostate ca: 25 treatments/01/2011  . Seizure disorder ([place]) 08-15-2011   Deja vu sensations: no w/u.  These stopped when neurontin 300mg  tid was started for a different reason.  . Status post chemotherapy    10/2010 thru 06/2011   Past Surgical History:  Procedure Laterality Date  . CARDIAC CATHETERIZATION  23 caths   pt states he has 6 stents  . COLONOSCOPY WITH PROPOFOL N/A 04/05/2013   Dr. Fuller Plan.  Polypectomy (hyperplastic--recall 10 yrs).  Moderate diverticulosis, +internal hemorrhoids.  No radiation proctitis.  Marland Kitchen HERNIA REPAIR  yrs ago   [employer]  . HOT HEMOSTASIS N/A 04/05/2013   Procedure: HOT HEMOSTASIS (ARGON PLASMA COAGULATION/BICAP);  Surgeon: Ladene Artist, MD;  Location: [facility];  Service: Endoscopy;  Laterality: N/A;  . seed implants     02/24/2011/ in prostate   Family History  Problem Relation Age of Onset  . Arthritis Mother   . Heart disease Father   . Bipolar disorder Father    Social History   Occupational History  . [employer]   Social History Main Topics  . Smoking status: Current Every Day Smoker    Packs/day: 1.00    Years: 46.00    Types: Cigarettes    Last attempt to quit: 07/18/2010  . Smokeless tobacco: Never Used  . Alcohol use Yes     Comment: occasional  . Drug use: No  . Sexual activity: Not on  file   Tobacco Counseling Ready to quit: Not Answered Counseling given: Not Answered   Activities of Daily Living No flowsheet data found.  Immunizations and Health Maintenance Immunization History  Administered Date(s) Administered  . Influenza Split 08/21/2011, 04/29/2012  . Influenza Whole 04/16/2009, 08/21/2010  . Influenza, High Dose Seasonal PF 03/26/2016  . Influenza,inj,Quad PF,36+ Mos 04/22/2013, 04/22/2013, 04/13/2014  . Pneumococcal Conjugate-13 11/13/2015  . Pneumococcal Polysaccharide-23 02/12/2004, 08/21/2011  . Td 07/15/2007  . Zoster 12/09/2012   There are no preventive care reminders to display for this patient.  Patient Care Team: [name], MD as PCP -  General Rana Snare, MD as Consulting Physician (Urology) Hennie Duos, MD as Consulting Physician (Rheumatology) Ladene Artist, MD as Consulting Physician (Gastroenterology)  Indicate any recent Medical Services you may have received from other than [facility] providers in the past year (date may be approximate).    Assessment:   This is a routine wellness examination for Efrem. Physical assessment deferred to PCP.  Hearing/Vision screen No exam data present  Dietary issues and exercise activities discussed:   Diet (meal preparation, eat out, water intake, caffeinated beverages, dairy products, fruits and vegetables):   Breakfast: Lunch:  Dinner:      Goals    None     Depression Screen PHQ 2/9 Scores 11/13/2015  PHQ - 2 Score 3  PHQ- 9 Score 6    Fall Risk Fall Risk  11/13/2015  Falls in the past year? No    Cognitive Function:        Screening Tests Health Maintenance  Topic Date Due  . PNA vac Low Risk Adult (2 of 2 - PPSV23) 11/12/2016  . TETANUS/TDAP  07/14/2017  . COLONOSCOPY  04/06/2023  . INFLUENZA VACCINE  Completed  . ZOSTAVAX  Completed  . Hepatitis C Screening  Completed        Plan:     Continue to eat heart healthy diet (full of fruits, vegetables,  whole grains, lean protein, water--limit salt, fat, and sugar intake) and increase physical activity as tolerated.  Continue doing brain stimulating activities (puzzles, reading, adult coloring books, staying active) to keep memory sharp.    During the course of the visit Paresh was educated and counseled about the following appropriate screening and preventive services:   Vaccines to include Pneumoccal, Influenza, Hepatitis B, Td, Zostavax, HCV  Colorectal cancer screening  Cardiovascular disease screening  Diabetes screening  Glaucoma screening  Nutrition counseling  Prostate cancer screening  Smoking cessation counseling  Patient Instructions (the written plan) were given to the patient.   Gerilyn Nestle, RN   05/23/2016

## 2016-05-23 NOTE — Progress Notes (Deleted)
Pre visit review using our clinic review tool, if applicable. No additional management support is needed unless otherwise documented below in the visit note. 

## 2016-06-04 ENCOUNTER — Other Ambulatory Visit: Payer: Self-pay | Admitting: Family Medicine

## 2016-06-04 MED ORDER — ALBUTEROL SULFATE HFA 108 (90 BASE) MCG/ACT IN AERS: 2.0000 | INHALATION_SPRAY | RESPIRATORY_TRACT | 1 refills | Status: DC | PRN

## 2016-06-04 NOTE — Telephone Encounter (Signed)
Patient's wife called requesting pt's rescue inhaler.  RX sent to [facility].

## 2016-06-16 ENCOUNTER — Other Ambulatory Visit: Payer: Self-pay | Admitting: Family Medicine

## 2016-06-16 MED ORDER — ALBUTEROL SULFATE (2.5 MG/3ML) 0.083% IN NEBU: INHALATION_SOLUTION | RESPIRATORY_TRACT | 1 refills | Status: DC

## 2016-06-16 MED ORDER — OXYCODONE HCL 10 MG PO TABS: ORAL_TABLET | ORAL | 0 refills | Status: DC

## 2016-06-16 NOTE — Telephone Encounter (Signed)
Pt needs albuterol 0.083 solution called to [facility] in [place] please.

## 2016-06-16 NOTE — Telephone Encounter (Signed)
Left message advising pt Rx is ready for p/u at front desk.

## 2016-06-16 NOTE — Telephone Encounter (Signed)
RF request for oxycodone LOV: 11/13/15 Next ov: None Last written: 03/26/16 #60 w/ 0RF  Please advise. Thanks.

## 2016-06-16 NOTE — Telephone Encounter (Signed)
RF request for albuterol LOV: 11/13/15 Next ov: None Last written: 06/05/15 #225 w/ 1RF

## 2016-07-22 ENCOUNTER — Other Ambulatory Visit: Payer: Self-pay | Admitting: Family Medicine

## 2016-07-22 NOTE — Telephone Encounter (Signed)
LOV: 03/26/16 NOV: None  RF request for cyclobenzaprine Last written: 11/13/15 #90 w/ 1RF  Please advise. Thanks.  ________________________ Rx below has been sent to pharmacy.  RF request for hctz Last written: 03/12/16 #90 w/ 0RF

## 2016-08-20 ENCOUNTER — Other Ambulatory Visit: Payer: Self-pay | Admitting: Family Medicine

## 2016-08-20 MED ORDER — OXYCODONE HCL 10 MG PO TABS: ORAL_TABLET | ORAL | 0 refills | Status: DC

## 2016-08-20 NOTE — Telephone Encounter (Signed)
RF request for oxycodone - pt requesting 90 day supply due to cost LOV: 03/26/16 Next ov: None Last written: 06/16/16 #60 w/ [id number]  Please advise. Thanks.

## 2016-08-20 NOTE — Telephone Encounter (Signed)
Patient is aware that he cannot get the 90 day supply -   He is also aware that the RX for the regular 60 is ready for pick-up.

## 2016-08-20 NOTE — Telephone Encounter (Signed)
Sorry, pls tell pt I don't do 90 day rx's for narcotics. I'll print a rx for his usual #60.-thx

## 2016-08-20 NOTE — Telephone Encounter (Signed)
Pt asking for a refill on Oxycodone, pt asking if he could get a 90 day supply due cost.

## 2016-09-23 ENCOUNTER — Telehealth: Payer: Self-pay | Admitting: Family Medicine

## 2016-09-23 NOTE — Telephone Encounter (Signed)
**  Remind patient they can make refill requests via MyChart**  Medication refill request (Name & Dosage): Oxycodone HCl 10 MG TABS   Preferred pharmacy (Name & Address):     Other comments (if applicable):   Self or wife will pick up from [facility].

## 2016-09-23 NOTE — Telephone Encounter (Signed)
Pt requesting rf of oxycodone.   Last RX dated 08/20/16.    Patient was supposed to return to clinic November 2017 but did not.   Last OV was 03/26/16.    Please advise

## 2016-09-24 MED ORDER — OXYCODONE HCL 10 MG PO TABS: ORAL_TABLET | ORAL | 0 refills | Status: DC

## 2016-09-24 NOTE — Telephone Encounter (Signed)
Left message for patient to return call on voicemail. 

## 2016-09-24 NOTE — Telephone Encounter (Signed)
Oxycodone rx printed. However, pt needs to come in for f/u of his chronic pain before any further RFs.  Tell him that guidelines and oversight of narcotic prescribing is stricter now, and I must see him in the office for monitoring of this every 3 months.--thx

## 2016-09-25 NOTE — Telephone Encounter (Signed)
Pt advised and voiced understanding.  Rx put up front to f/u. Apt made for 09/29/16 at 3:30pm.

## 2016-09-29 ENCOUNTER — Encounter: Payer: Self-pay | Admitting: Family Medicine

## 2016-09-29 ENCOUNTER — Ambulatory Visit: Payer: Medicare Other | Admitting: Family Medicine

## 2016-09-29 NOTE — Progress Notes (Deleted)
OFFICE VISIT  09/29/2016   CC: No chief complaint on file.  HPI:    Patient is a 66 y.o. Caucasian male who presents for 6 mo f/u chronic pain syndrome: chronic musculoskeletal pain (osteoarthritis + gouty arthritis--Dr. Amil Amen).  Indication for chronic opioid: see above. Medication and dose: oxycodone 10mg , 1-2 tabs po bid prn pain. # pills per month: 60 per rx (on average, he gets a rx for this med every 3 mo. Last UDS date: 01/11/14 Pain contract signed (Y/N): yes--01/11/14. Date narcotic database last reviewed (include red flags): ***   Past Medical History:  Diagnosis Date  . Anxiety   . 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 COPD (chronic obstructive pulmonary disease) ([place])    "pearl study" pt thinks he is on Symbicort  . Coronary atherosclerosis of unspecified type of vessel, native or graft 2005   MI, s/p PCI with stent (pt reports 20+ interventions in the past, most recent cat 6/08 showed patent stents).  Normal LV function.  Nuclear stress test NEG 8/09.  . Depression    ?bipolar dx by psychiatrist?  . Diverticulosis 2006  . ED (erectile dysfunction)   . Gouty arthritis    Dr. Amil Amen  . Hematochezia 09/2012   Hyperplastic polyp, diverticulosis, and internal hemorrhoids found on colonoscopy 10/2012  . Hyperlipidemia   . Hypertension   . 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 Myocardial infarction    x 2 or 3  . Neuropathic pain of both feet ([place]) 2015/2016   Vit B12 and A1c normal 10/2014  . Obesities, morbid ([place])   . OSA on CPAP    trying to get back on CPAP as of 07/2013  . Osteoarthrosis, unspecified whether generalized or localized, unspecified site    DDD  . Prediabetes 2016/17  . Prostate cancer ([place]) 09/2010   Localized, high risk disease (Dr. Kimbrough/Grapey):  s/p 5 wks ext bm rad + seed boost, as well as hormone blockade x 1 yr.  Biochemical recurrence 11/2015--urologist did bone scan and spots on T spine  and L iliac region came up, pt needs plain films of pelvis/T spine to see if DJD correlate present.  He'll eventually need to resume androgen depriv therapy.  . Radiation    Pelvic--for prostate ca: 25 treatments/01/2011  . Seizure disorder ([place]) 08-15-2011   Deja vu sensations: no w/u.  These stopped when neurontin 300mg  tid was started for a different reason.  . Status post chemotherapy    10/2010 thru 06/2011    Past Surgical History:  Procedure Laterality Date  . CARDIAC CATHETERIZATION  23 caths   pt states he has 6 stents  . COLONOSCOPY WITH PROPOFOL N/A 04/05/2013   Dr. Fuller Plan.  Polypectomy (hyperplastic--recall 10 yrs).  Moderate diverticulosis, +internal hemorrhoids.  No radiation proctitis.  Marland Kitchen HERNIA REPAIR  yrs ago   [employer]  . HOT HEMOSTASIS N/A 04/05/2013   Procedure: HOT HEMOSTASIS (ARGON PLASMA COAGULATION/BICAP);  Surgeon: Ladene Artist, MD;  Location: [facility];  Service: Endoscopy;  Laterality: N/A;  . seed implants     02/24/2011/ in prostate    Outpatient Medications Prior to Visit  Medication Sig Dispense Refill  . acyclovir (ZOVIRAX) 400 MG tablet 1 tab po tid x 5d for acute episode of fever blisters 90 tablet 1  . albuterol (PROAIR HFA) 108 (90 Base) MCG/ACT inhaler Inhale 2 puffs into the lungs every 4 (four) hours as needed. 3 Inhaler 1  .  albuterol (PROVENTIL) (2.5 MG/3ML) 0.083% nebulizer solution USE ONE VIAL IN NEBULIZER EVERY 4 HOURS AS NEEDED 225 vial 1  . allopurinol (ZYLOPRIM) 300 MG tablet Take 1 tablet (300 mg total) by mouth daily. 90 tablet 3  . amoxicillin (AMOXIL) 500 MG capsule 1 tab po tid x 21 days 63 capsule 0  . aspirin 325 MG tablet Take 325 mg by mouth daily.      Marland Kitchen atorvastatin (LIPITOR) 20 MG tablet Take 1 tablet (20 mg total) by mouth daily. (Patient not taking: Reported on 03/26/2016) 90 tablet 3  . bisoprolol-hydrochlorothiazide (ZIAC) 5-6.25 MG tablet Take 1 tablet by mouth every morning. 90 tablet 3  . Budesonide (PULMICORT FLEXHALER)  90 MCG/ACT inhaler Inhale 2 puffs into the lungs 2 (two) times daily.    . citalopram (CELEXA) 40 MG tablet Take 1 tablet (40 mg total) by mouth at bedtime. 90 tablet 3  . colchicine 0.6 MG tablet Take 1 tablet (0.6 mg total) by mouth 2 (two) times daily as needed (gout flare ups). 60 tablet 6  . cyclobenzaprine (FLEXERIL) 10 MG tablet TAKE ONE TABLET BY MOUTH ONCE DAILY 90 tablet 1  . furosemide (LASIX) 40 MG tablet Take 2 tablets (80 mg total) by mouth daily as needed. 180 tablet 1  . gabapentin (NEURONTIN) 300 MG capsule 2 tabs po tid 540 capsule 3  . hydrochlorothiazide (HYDRODIURIL) 25 MG tablet TAKE ONE TABLET BY MOUTH ONCE DAILY IN THE MORNING 90 tablet 1  . lisinopril (PRINIVIL,ZESTRIL) 20 MG tablet 1 tab po bid 180 tablet 3  . Oxycodone HCl 10 MG TABS 1-2 tabs po bid prn pain 60 tablet 0  . predniSONE (DELTASONE) 20 MG tablet 3 tabs po qd x 5d, then 2 tab po qd x 5d, then 1 tab po qd x 5d 30 tablet 0  . Probiotic Product (PROBIOTIC PO) Take 1 capsule by mouth daily.    Marland Kitchen terazosin (HYTRIN) 2 MG capsule Take 1 capsule (2 mg total) by mouth at bedtime. 90 capsule 3  . zolpidem (AMBIEN) 10 MG tablet Take 1 tablet (10 mg total) by mouth at bedtime as needed for sleep. 90 tablet 1   No facility-administered medications prior to visit.     Allergies  Allergen Reactions  . Amitriptyline Hcl     REACTION: nausea, elevated bp  . Ezetimibe     REACTION: chest pain, fatigue    ROS As per HPI  PE: There were no vitals taken for this visit. ***  LABS:   Lab Results  Component Value Date   LABURIC 5.8 10/12/2014   Lab Results  Component Value Date   TSH 1.38 04/29/2012   Lab Results  Component Value Date   WBC 8.6 04/29/2012   HGB 15.4 04/05/2013   HCT 45.4 04/05/2013   MCV 89.1 04/29/2012   PLT 287.0 04/29/2012   Lab Results  Component Value Date   CREATININE 1.00 11/15/2015   BUN 16 11/15/2015   NA 141 11/15/2015   K 3.8 11/15/2015   CL 99 11/15/2015   CO2 31  11/15/2015   Lab Results  Component Value Date   ALT 13 11/15/2015   AST 15 11/15/2015   ALKPHOS 77 11/15/2015   BILITOT 0.5 11/15/2015   Lab Results  Component Value Date   CHOL 201 (H) 11/15/2015   Lab Results  Component Value Date   HDL 35.90 (L) 11/15/2015   Lab Results  Component Value Date   LDLCALC 135 (H) 11/15/2015  Lab Results  Component Value Date   TRIG 153.0 (H) 11/15/2015   Lab Results  Component Value Date   CHOLHDL 6 11/15/2015   Lab Results  Component Value Date   PSA 6.15 (H) 11/15/2015   PSA 1.50 04/13/2014   PSA 4.91 (H) 08/23/2010   Lab Results  Component Value Date   HGBA1C 6.1 11/15/2015    IMPRESSION AND PLAN:  No problem-specific Assessment & Plan notes found for this encounter.   FOLLOW UP: No Follow-up on file.

## 2016-10-08 ENCOUNTER — Other Ambulatory Visit (HOSPITAL_COMMUNITY): Payer: Self-pay | Admitting: Urology

## 2016-10-08 DIAGNOSIS — C61 Malignant neoplasm of prostate: Secondary | ICD-10-CM

## 2016-10-15 ENCOUNTER — Other Ambulatory Visit: Payer: Self-pay | Admitting: Family Medicine

## 2016-10-30 ENCOUNTER — Other Ambulatory Visit: Payer: Self-pay | Admitting: Family Medicine

## 2016-10-30 ENCOUNTER — Encounter: Payer: Self-pay | Admitting: Family Medicine

## 2016-10-30 ENCOUNTER — Ambulatory Visit (INDEPENDENT_AMBULATORY_CARE_PROVIDER_SITE_OTHER): Payer: Medicare Other | Admitting: Family Medicine

## 2016-10-30 VITALS — BP 133/72 | HR 67 | Temp 97.4°F | Resp 16 | Ht 70.5 in | Wt 284.5 lb

## 2016-10-30 DIAGNOSIS — I10 Essential (primary) hypertension: Secondary | ICD-10-CM

## 2016-10-30 DIAGNOSIS — M150 Primary generalized (osteo)arthritis: Secondary | ICD-10-CM

## 2016-10-30 DIAGNOSIS — E7800 Pure hypercholesterolemia, unspecified: Secondary | ICD-10-CM

## 2016-10-30 DIAGNOSIS — M159 Polyosteoarthritis, unspecified: Secondary | ICD-10-CM

## 2016-10-30 DIAGNOSIS — G894 Chronic pain syndrome: Secondary | ICD-10-CM | POA: Diagnosis not present

## 2016-10-30 DIAGNOSIS — R7301 Impaired fasting glucose: Secondary | ICD-10-CM | POA: Diagnosis not present

## 2016-10-30 DIAGNOSIS — C61 Malignant neoplasm of prostate: Secondary | ICD-10-CM | POA: Diagnosis not present

## 2016-10-30 MED ORDER — ALBUTEROL SULFATE (2.5 MG/3ML) 0.083% IN NEBU: INHALATION_SOLUTION | RESPIRATORY_TRACT | 0 refills | Status: DC

## 2016-10-30 MED ORDER — ALBUTEROL SULFATE HFA 108 (90 BASE) MCG/ACT IN AERS: 2.0000 | INHALATION_SPRAY | RESPIRATORY_TRACT | 1 refills | Status: DC | PRN

## 2016-10-30 MED ORDER — OXYCODONE HCL 10 MG PO TABS: ORAL_TABLET | ORAL | 0 refills | Status: DC

## 2016-10-30 MED ORDER — ZOLPIDEM TARTRATE 10 MG PO TABS: 10.0000 mg | ORAL_TABLET | Freq: Every evening | ORAL | 1 refills | Status: DC | PRN

## 2016-10-30 NOTE — Progress Notes (Signed)
Pre visit review using our clinic review tool, if applicable. No additional management support is needed unless otherwise documented below in the visit note. 

## 2016-10-30 NOTE — Progress Notes (Signed)
OFFICE VISIT  10/30/2016   CC:  Chief Complaint  Patient presents with  . Follow-up    [facility],    HPI:    Patient is a 66 y.o. Caucasian male who presents for f/u HTN, HLD, IFG, and generalized osteoarthritis that requires chronic narcotic pain medication to maximize quality of life and functioning.  Indication for chronic opioid: see above. Medication and dose: oxycodone 10mg , 1-2 bid # pills per month: 60 Last UDS date: 01/11/14 Pain contract signed (Y/N): Yes (01/11/14). Date narcotic database last reviewed (include red flags): today.   Pain has increased last 3-4 mo in L lower beltline area extending to tailbone, L hip, down leg to below knee. Tried not taking narcotics for a while but he gave in and resumed them and they do help his pain. Still taking ibup 800 occasionally, with tylenol and oxycodone pill as well---finds this helps the most. He still has not gone for the x-rays that urology wanted in order to further eval for bone mets from his prostate cancer. Having more nausea lately.   He has been smoking marijuana lately to try to treat his pain. Most recent oxycodone was 1-2 days ago.  Most recent cyclobenzaprine was last night.  He has been out of [place] for "weeks" so he should not have this in his urine.  He took a few of his wife's tramadol as recently as a "few days" ago.  HTN: he says his home bp checks have been 130s/70s.  He attributes this to smoking marijuana. He also claims this helps his bPH sx's.  HLD: he stopped atorv b/c of diffuse myalgias.  These all went away after stopping atorva.  He seems to recall other statins doing same thing.  However, he thinks he was on crestor in the past and thinks he recalls feeling ok on this.   Due to financial constraints he has not had his symbicort in "a long time".  He is working on getting this med free from the drug company.  Describes COPD flare a couple weeks ago and he had an old prednisone rx from me that he had never  filled so he filled it and took it and the flare eventually resolved.     Past Medical History:  Diagnosis Date  . Anxiety   . 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 COPD (chronic obstructive pulmonary disease) ([place])    "pearl study" pt thinks he is on Symbicort  . Coronary atherosclerosis of unspecified type of vessel, native or graft 2005   MI, s/p PCI with stent (pt reports 20+ interventions in the past, most recent cat 6/08 showed patent stents).  Normal LV function.  Nuclear stress test NEG 8/09.  . Depression    ?bipolar dx by psychiatrist?  . Diverticulosis 2006  . ED (erectile dysfunction)   . Gouty arthritis    Dr. Amil Amen  . Hematochezia 09/2012   Hyperplastic polyp, diverticulosis, and internal hemorrhoids found on colonoscopy 10/2012  . Hyperlipidemia   . Hypertension   . 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 Myocardial infarction ([place])    x 2 or 3  . Neuropathic pain of both feet 2015/2016   Vit B12 and A1c normal 10/2014  . Obesities, morbid ([place])   . OSA on CPAP    trying to get back on CPAP as of 07/2013  . Osteoarthrosis, unspecified whether generalized or localized, unspecified site    DDD  .  Prediabetes 2016/17  . Prostate cancer ([place]) 09/2010   Localized, high risk disease (Dr. Kimbrough/[place]):  s/p 5 wks ext bm rad + seed boost, as well as hormone blockade x 1 yr.  Biochemical recurrence 11/2015--urologist did bone scan and spots on T spine and L iliac region came up, pt needs plain films of pelvis/T spine to see if DJD correlate present.  He'll eventually need to resume androgen depriv therapy.  . Radiation    Pelvic--for prostate ca: 25 treatments/01/2011  . Seizure disorder ([place]) 08-15-2011   Deja vu sensations: no w/u.  These stopped when neurontin 300mg  tid was started for a different reason.  . Status post chemotherapy    10/2010 thru 06/2011    Past Surgical History:  Procedure Laterality Date  .  CARDIAC CATHETERIZATION  23 caths   pt states he has 6 stents  . COLONOSCOPY WITH PROPOFOL N/A 04/05/2013   Dr. Fuller Plan.  Polypectomy (hyperplastic--recall 10 yrs).  Moderate diverticulosis, +internal hemorrhoids.  No radiation proctitis.  Marland Kitchen HERNIA REPAIR  yrs ago   [employer]  . HOT HEMOSTASIS N/A 04/05/2013   Procedure: HOT HEMOSTASIS (ARGON PLASMA COAGULATION/BICAP);  Surgeon: Ladene Artist, MD;  Location: [facility];  Service: Endoscopy;  Laterality: N/A;  . seed implants     02/24/2011/ in prostate    Outpatient Medications Prior to Visit  Medication Sig Dispense Refill  . acyclovir (ZOVIRAX) 400 MG tablet 1 tab po tid x 5d for acute episode of fever blisters 90 tablet 1  . allopurinol (ZYLOPRIM) 300 MG tablet Take 1 tablet (300 mg total) by mouth daily. 90 tablet 3  . aspirin 325 MG tablet Take 325 mg by mouth daily.      . bisoprolol-hydrochlorothiazide (ZIAC) 5-6.25 MG tablet Take 1 tablet by mouth every morning. 90 tablet 3  . Budesonide (PULMICORT FLEXHALER) 90 MCG/ACT inhaler Inhale 2 puffs into the lungs 2 (two) times daily.    . citalopram (CELEXA) 40 MG tablet Take 1 tablet (40 mg total) by mouth at bedtime. 90 tablet 3  . colchicine 0.6 MG tablet Take 1 tablet (0.6 mg total) by mouth 2 (two) times daily as needed (gout flare ups). 60 tablet 6  . cyclobenzaprine (FLEXERIL) 10 MG tablet TAKE ONE TABLET BY MOUTH ONCE DAILY 90 tablet 1  . furosemide (LASIX) 40 MG tablet Take 2 tablets (80 mg total) by mouth daily as needed. 180 tablet 1  . gabapentin (NEURONTIN) 300 MG capsule 2 tabs po tid 540 capsule 3  . hydrochlorothiazide (HYDRODIURIL) 25 MG tablet TAKE ONE TABLET BY MOUTH ONCE DAILY IN THE MORNING 90 tablet 1  . lisinopril (PRINIVIL,ZESTRIL) 20 MG tablet 1 tab po bid 180 tablet 3  . Probiotic Product (PROBIOTIC PO) Take 1 capsule by mouth daily.    Marland Kitchen terazosin (HYTRIN) 2 MG capsule Take 1 capsule (2 mg total) by mouth at bedtime. 90 capsule 3  . albuterol (PROAIR HFA) 108 (90  Base) MCG/ACT inhaler Inhale 2 puffs into the lungs every 4 (four) hours as needed. 3 Inhaler 1  . albuterol (PROVENTIL) (2.5 MG/3ML) 0.083% nebulizer solution USE ONE VIAL IN NEBULIZER EVERY 4 HOURS AS NEEDED 225 vial 0  . Oxycodone HCl 10 MG TABS 1-2 tabs po bid prn pain 60 tablet 0  . zolpidem (AMBIEN) 10 MG tablet Take 1 tablet (10 mg total) by mouth at bedtime as needed for sleep. 90 tablet 1  . amoxicillin (AMOXIL) 500 MG capsule 1 tab po tid x 21 days (Patient  not taking: Reported on 10/30/2016) 63 capsule 0  . atorvastatin (LIPITOR) 20 MG tablet Take 1 tablet (20 mg total) by mouth daily. (Patient not taking: Reported on 03/26/2016) 90 tablet 3  . predniSONE (DELTASONE) 20 MG tablet 3 tabs po qd x 5d, then 2 tab po qd x 5d, then 1 tab po qd x 5d (Patient not taking: Reported on 10/30/2016) 30 tablet 0   No facility-administered medications prior to visit.     Allergies  Allergen Reactions  . Amitriptyline Hcl     REACTION: nausea, elevated bp  . Ezetimibe     REACTION: chest pain, fatigue    ROS As per HPI  PE: Blood pressure 133/72, pulse 67, temperature 97.4 F (36.3 C), temperature source Oral, resp. rate 16, height 5' 10.5" (1.791 m), weight 284 lb 8 oz (129 kg), SpO2 95 %. Body mass index is 40.24 kg/m. Gen: Alert, well appearing.  Patient is oriented to person, place, time, and situation. AFFECT: pleasant, lucid thought and speech. CV: RRR, no m/r/g.   LUNGS: CTA bilat on inspiration, nonlabored resps.  He has soft wheezing and prolonged exp phase and diminished aeration diffusely on exhalation.   LABS:  Lab Results  Component Value Date   TSH 1.38 04/29/2012   Lab Results  Component Value Date   WBC 8.6 04/29/2012   HGB 15.4 04/05/2013   HCT 45.4 04/05/2013   MCV 89.1 04/29/2012   PLT 287.0 04/29/2012   Lab Results  Component Value Date   CREATININE 1.00 11/15/2015   BUN 16 11/15/2015   NA 141 11/15/2015   K 3.8 11/15/2015   CL 99 11/15/2015   CO2 31  11/15/2015   Lab Results  Component Value Date   ALT 13 11/15/2015   AST 15 11/15/2015   ALKPHOS 77 11/15/2015   BILITOT 0.5 11/15/2015   Lab Results  Component Value Date   CHOL 201 (H) 11/15/2015   Lab Results  Component Value Date   HDL 35.90 (L) 11/15/2015   Lab Results  Component Value Date   LDLCALC 135 (H) 11/15/2015   Lab Results  Component Value Date   TRIG 153.0 (H) 11/15/2015   Lab Results  Component Value Date   CHOLHDL 6 11/15/2015   Lab Results  Component Value Date   PSA 6.15 (H) 11/15/2015   PSA 1.50 04/13/2014   PSA 4.91 (H) 08/23/2010   Lab Results  Component Value Date   HGBA1C 6.1 11/15/2015   IMPRESSION AND PLAN:  1) HTN; The current medical regimen is effective;  continue present plan and medications. Check lytes/cr in morning when fasting.  2) Hypercholesterolemia: hx of statin intolerance.  Not fasting today. Says he cannot afford to start any new med at this time anyway.  We'll consider generic crestor in the future since he vaguely recalls tolerating this med.  He is returning in the A.M to get FLP.  3) IFG: unfortunately he does not eat a healthy diet, nor does he exercise.  He is sedentary. Due to his pain, he says he hardly even gets out of the house. Check Hba1c with fasting labs tomorrow morning.  4) Chronic pain syndrome: diffuse osteoarthritis plus pain suspected to be from bone mets from his recurrent prostate cancer. He says he will go get the x-rays that urology wanted months ago so they can see if bone scan abnormalities on L spine and L iliac region correlate with lytic or sclerotic regions on x-ray. Unfortunately, he is smoking marijuana  b/c he finds that this works well for his pain.  I told him that I cannot prescribe him narcotics/controlled substances if he is also smoking marijuana.  He expressed understanding. I told him that I'll do a UDS on him today and if marijuana is found as expected, then he will have to agree  to stop smoking marijuana and I'll repeat UDS in 6 weeks.  If marijuana shows up again, I will cease to rx him controlled substances.  If marijuana not on the repeat UDS (and subsequent UDS's in the future), then I will continue to rx his oxycodone.  5) Prostate cancer, biochemical recurrence about 1 yr ago, + question of bone mets on bone scan. As stated above in #4, get x-rays and f/u with urologist as recommended.   Anticipate urology restarting androgen deprivation therapy.  An After Visit Summary was printed and given to the patient.  FOLLOW UP: Return in about 4 months (around 03/01/2017) for routine chronic illness f/u.  Signed:  Crissie Sickles, MD           10/30/2016

## 2016-10-31 ENCOUNTER — Other Ambulatory Visit (INDEPENDENT_AMBULATORY_CARE_PROVIDER_SITE_OTHER): Payer: Medicare Other

## 2016-10-31 ENCOUNTER — Encounter: Payer: Self-pay | Admitting: Family Medicine

## 2016-10-31 DIAGNOSIS — Z79899 Other long term (current) drug therapy: Secondary | ICD-10-CM | POA: Diagnosis not present

## 2016-10-31 DIAGNOSIS — E7800 Pure hypercholesterolemia, unspecified: Secondary | ICD-10-CM

## 2016-10-31 DIAGNOSIS — I10 Essential (primary) hypertension: Secondary | ICD-10-CM

## 2016-10-31 DIAGNOSIS — R7301 Impaired fasting glucose: Secondary | ICD-10-CM | POA: Diagnosis not present

## 2016-10-31 DIAGNOSIS — Z79891 Long term (current) use of opiate analgesic: Secondary | ICD-10-CM | POA: Diagnosis not present

## 2016-10-31 LAB — COMPREHENSIVE METABOLIC PANEL
ALBUMIN: 3.8 g/dL (ref 3.5–5.2)
ALK PHOS: 156 U/L — AB (ref 39–117)
ALT: 15 U/L (ref 0–53)
AST: 13 U/L (ref 0–37)
BUN: 25 mg/dL — ABNORMAL HIGH (ref 6–23)
CO2: 33 mEq/L — ABNORMAL HIGH (ref 19–32)
Calcium: 9 mg/dL (ref 8.4–10.5)
Chloride: 101 mEq/L (ref 96–112)
Creatinine, Ser: 0.97 mg/dL (ref 0.40–1.50)
GFR: 82.03 mL/min (ref >60.00)
Glucose, Bld: 92 mg/dL (ref 70–99)
Potassium: 4.3 mEq/L (ref 3.5–5.1)
Sodium: 141 mEq/L (ref 135–145)
TOTAL PROTEIN: 6.5 g/dL (ref 6.0–8.3)
Total Bilirubin: 0.5 mg/dL (ref 0.2–1.2)

## 2016-10-31 LAB — LIPID PANEL
CHOLESTEROL: 211 mg/dL — AB (ref 0–200)
HDL: 37.3 mg/dL — AB (ref >39.00)
LDL Cholesterol: 146 mg/dL — ABNORMAL HIGH (ref 0–99)
NonHDL: 173.8
TRIGLYCERIDES: 137 mg/dL (ref 0.0–149.0)
Total CHOL/HDL Ratio: 6
VLDL: 27.4 mg/dL (ref 0.0–40.0)

## 2016-10-31 LAB — HEMOGLOBIN A1C: HEMOGLOBIN A1C: 6.3 % (ref 4.6–6.5)

## 2016-11-02 ENCOUNTER — Encounter: Payer: Self-pay | Admitting: Family Medicine

## 2016-11-04 ENCOUNTER — Telehealth: Payer: Self-pay | Admitting: Family Medicine

## 2016-11-04 ENCOUNTER — Inpatient Hospital Stay (HOSPITAL_COMMUNITY)
Admission: EM | Admit: 2016-11-04 | Discharge: 2016-11-07 | DRG: 190 | Disposition: A | Discharge status: Home / Self Care | Payer: Medicare Other | Attending: Family Medicine | Admitting: Family Medicine

## 2016-11-04 ENCOUNTER — Encounter (HOSPITAL_COMMUNITY): Payer: Self-pay

## 2016-11-04 ENCOUNTER — Emergency Department (HOSPITAL_COMMUNITY): Payer: Medicare Other

## 2016-11-04 DIAGNOSIS — I10 Essential (primary) hypertension: Secondary | ICD-10-CM | POA: Diagnosis present

## 2016-11-04 DIAGNOSIS — G894 Chronic pain syndrome: Secondary | ICD-10-CM | POA: Diagnosis not present

## 2016-11-04 DIAGNOSIS — F17210 Nicotine dependence, cigarettes, uncomplicated: Secondary | ICD-10-CM | POA: Diagnosis present

## 2016-11-04 DIAGNOSIS — M109 Gout, unspecified: Secondary | ICD-10-CM | POA: Diagnosis present

## 2016-11-04 DIAGNOSIS — C61 Malignant neoplasm of prostate: Secondary | ICD-10-CM | POA: Diagnosis present

## 2016-11-04 DIAGNOSIS — Z23 Encounter for immunization: Secondary | ICD-10-CM | POA: Diagnosis not present

## 2016-11-04 DIAGNOSIS — Z6839 Body mass index (BMI) 39.0-39.9, adult: Secondary | ICD-10-CM

## 2016-11-04 DIAGNOSIS — Z9119 Patient's noncompliance with other medical treatment and regimen: Secondary | ICD-10-CM | POA: Diagnosis not present

## 2016-11-04 DIAGNOSIS — Z955 Presence of coronary angioplasty implant and graft: Secondary | ICD-10-CM

## 2016-11-04 DIAGNOSIS — G4733 Obstructive sleep apnea (adult) (pediatric): Secondary | ICD-10-CM | POA: Diagnosis present

## 2016-11-04 DIAGNOSIS — N179 Acute kidney failure, unspecified: Secondary | ICD-10-CM | POA: Diagnosis present

## 2016-11-04 DIAGNOSIS — R0902 Hypoxemia: Secondary | ICD-10-CM | POA: Diagnosis present

## 2016-11-04 DIAGNOSIS — J181 Lobar pneumonia, unspecified organism: Secondary | ICD-10-CM

## 2016-11-04 DIAGNOSIS — F17200 Nicotine dependence, unspecified, uncomplicated: Secondary | ICD-10-CM | POA: Diagnosis present

## 2016-11-04 DIAGNOSIS — F419 Anxiety disorder, unspecified: Secondary | ICD-10-CM | POA: Diagnosis present

## 2016-11-04 DIAGNOSIS — F319 Bipolar disorder, unspecified: Secondary | ICD-10-CM | POA: Diagnosis present

## 2016-11-04 DIAGNOSIS — R000 Tachycardia, unspecified: Secondary | ICD-10-CM | POA: Diagnosis not present

## 2016-11-04 DIAGNOSIS — R0602 Shortness of breath: Secondary | ICD-10-CM

## 2016-11-04 DIAGNOSIS — J440 Chronic obstructive pulmonary disease with acute lower respiratory infection: Secondary | ICD-10-CM | POA: Diagnosis present

## 2016-11-04 DIAGNOSIS — J441 Chronic obstructive pulmonary disease with (acute) exacerbation: Secondary | ICD-10-CM | POA: Diagnosis present

## 2016-11-04 DIAGNOSIS — Z818 Family history of other mental and behavioral disorders: Secondary | ICD-10-CM

## 2016-11-04 DIAGNOSIS — Z8249 Family history of ischemic heart disease and other diseases of the circulatory system: Secondary | ICD-10-CM

## 2016-11-04 DIAGNOSIS — I471 Supraventricular tachycardia: Secondary | ICD-10-CM | POA: Diagnosis present

## 2016-11-04 DIAGNOSIS — I2583 Coronary atherosclerosis due to lipid rich plaque: Secondary | ICD-10-CM | POA: Diagnosis not present

## 2016-11-04 DIAGNOSIS — Z79899 Other long term (current) drug therapy: Secondary | ICD-10-CM

## 2016-11-04 DIAGNOSIS — I4891 Unspecified atrial fibrillation: Secondary | ICD-10-CM | POA: Diagnosis present

## 2016-11-04 DIAGNOSIS — R079 Chest pain, unspecified: Secondary | ICD-10-CM | POA: Diagnosis not present

## 2016-11-04 DIAGNOSIS — Z7982 Long term (current) use of aspirin: Secondary | ICD-10-CM

## 2016-11-04 DIAGNOSIS — I2510 Atherosclerotic heart disease of native coronary artery without angina pectoris: Secondary | ICD-10-CM | POA: Diagnosis present

## 2016-11-04 DIAGNOSIS — E669 Obesity, unspecified: Secondary | ICD-10-CM | POA: Diagnosis present

## 2016-11-04 DIAGNOSIS — C7951 Secondary malignant neoplasm of bone: Secondary | ICD-10-CM | POA: Diagnosis present

## 2016-11-04 DIAGNOSIS — E872 Acidosis: Secondary | ICD-10-CM | POA: Diagnosis present

## 2016-11-04 DIAGNOSIS — E785 Hyperlipidemia, unspecified: Secondary | ICD-10-CM | POA: Diagnosis present

## 2016-11-04 DIAGNOSIS — E871 Hypo-osmolality and hyponatremia: Secondary | ICD-10-CM | POA: Diagnosis present

## 2016-11-04 DIAGNOSIS — J189 Pneumonia, unspecified organism: Secondary | ICD-10-CM | POA: Diagnosis present

## 2016-11-04 DIAGNOSIS — R0682 Tachypnea, not elsewhere classified: Secondary | ICD-10-CM | POA: Diagnosis present

## 2016-11-04 DIAGNOSIS — Z86010 Personal history of colonic polyps: Secondary | ICD-10-CM

## 2016-11-04 DIAGNOSIS — Z888 Allergy status to other drugs, medicaments and biological substances status: Secondary | ICD-10-CM

## 2016-11-04 DIAGNOSIS — I252 Old myocardial infarction: Secondary | ICD-10-CM

## 2016-11-04 LAB — CBC WITH DIFFERENTIAL/PLATELET
Basophils Absolute: 0 10*3/uL (ref 0.0–0.1)
Basophils Relative: 0 %
EOS ABS: 0 10*3/uL (ref 0.0–0.7)
EOS PCT: 0 %
HCT: 42.9 % (ref 39.0–52.0)
Hemoglobin: 14.2 g/dL (ref 13.0–17.0)
LYMPHS ABS: 1.2 10*3/uL (ref 0.7–4.0)
LYMPHS PCT: 17 %
MCH: 28.2 pg (ref 26.0–34.0)
MCHC: 33.1 g/dL (ref 30.0–36.0)
MCV: 85.3 fL (ref 78.0–100.0)
MONO ABS: 0.4 10*3/uL (ref 0.1–1.0)
Monocytes Relative: 6 %
Neutro Abs: 5.3 10*3/uL (ref 1.7–7.7)
Neutrophils Relative %: 77 %
PLATELETS: 213 10*3/uL (ref 150–400)
RBC: 5.03 MIL/uL (ref 4.22–5.81)
RDW: 14.5 % (ref 11.5–15.5)
WBC: 6.9 10*3/uL (ref 4.0–10.5)

## 2016-11-04 LAB — I-STAT CHEM 8, ED
BUN: 28 mg/dL — ABNORMAL HIGH (ref 6–20)
CALCIUM ION: 0.96 mmol/L — AB (ref 1.15–1.40)
Chloride: 93 mmol/L — ABNORMAL LOW (ref 101–111)
Creatinine, Ser: 1.3 mg/dL — ABNORMAL HIGH (ref 0.61–1.24)
GLUCOSE: 133 mg/dL — AB (ref 65–99)
HCT: 46 % (ref 39.0–52.0)
HEMOGLOBIN: 15.6 g/dL (ref 13.0–17.0)
Potassium: 3.7 mmol/L (ref 3.5–5.1)
SODIUM: 131 mmol/L — AB (ref 135–145)
TCO2: 29 mmol/L (ref 0–100)

## 2016-11-04 LAB — I-STAT VENOUS BLOOD GAS, ED
ACID-BASE EXCESS: 4 mmol/L — AB (ref 0.0–2.0)
BICARBONATE: 30.5 mmol/L — AB (ref 20.0–28.0)
O2 SAT: 72 %
PO2 VEN: 38 mmHg (ref 32.0–45.0)
TCO2: 32 mmol/L (ref 0–100)
pCO2, Ven: 48.9 mmHg (ref 44.0–60.0)
pH, Ven: 7.403 (ref 7.250–7.430)

## 2016-11-04 LAB — I-STAT TROPONIN, ED: Troponin i, poc: 0 ng/mL (ref 0.00–0.08)

## 2016-11-04 LAB — I-STAT CG4 LACTIC ACID, ED: Lactic Acid, Venous: 2.74 mmol/L (ref 0.5–1.9)

## 2016-11-04 MED ORDER — DEXTROSE 5 % IV SOLN
1.0000 g | Freq: Once | INTRAVENOUS | Status: AC
  Administered 2016-11-04: 1 g via INTRAVENOUS
  Filled 2016-11-04: qty 10

## 2016-11-04 MED ORDER — AZITHROMYCIN 250 MG PO TABS: ORAL_TABLET | ORAL | 0 refills | Status: DC

## 2016-11-04 MED ORDER — FUROSEMIDE 10 MG/ML IJ SOLN: 40.0000 mg | Freq: Once | INTRAMUSCULAR | Status: DC

## 2016-11-04 MED ORDER — DILTIAZEM HCL-DEXTROSE 100-5 MG/100ML-% IV SOLN (PREMIX)
5.0000 mg/h | INTRAVENOUS | Status: DC
  Administered 2016-11-04: 5 mg/h via INTRAVENOUS
  Filled 2016-11-04: qty 100

## 2016-11-04 MED ORDER — ACETAMINOPHEN 500 MG PO TABS
1000.0000 mg | ORAL_TABLET | Freq: Once | ORAL | Status: AC
  Administered 2016-11-04: 1000 mg via ORAL
  Filled 2016-11-04: qty 2

## 2016-11-04 MED ORDER — PREDNISONE 20 MG PO TABS: ORAL_TABLET | ORAL | 0 refills | Status: DC

## 2016-11-04 MED ORDER — ASPIRIN 81 MG PO CHEW
324.0000 mg | CHEWABLE_TABLET | Freq: Once | ORAL | Status: DC
  Filled 2016-11-04: qty 4

## 2016-11-04 MED ORDER — ONDANSETRON HCL 4 MG/2ML IJ SOLN
4.0000 mg | Freq: Once | INTRAMUSCULAR | Status: AC
  Administered 2016-11-04: 4 mg via INTRAVENOUS
  Filled 2016-11-04: qty 2

## 2016-11-04 MED ORDER — DEXTROSE 5 % IV SOLN
500.0000 mg | Freq: Once | INTRAVENOUS | Status: AC
  Administered 2016-11-04: 500 mg via INTRAVENOUS
  Filled 2016-11-04: qty 500

## 2016-11-04 MED ORDER — SODIUM CHLORIDE 0.9 % IV BOLUS (SEPSIS)
1000.0000 mL | Freq: Once | INTRAVENOUS | Status: AC
  Administered 2016-11-04: 1000 mL via INTRAVENOUS

## 2016-11-04 MED ORDER — DILTIAZEM LOAD VIA INFUSION
10.0000 mg | Freq: Once | INTRAVENOUS | Status: AC
  Administered 2016-11-04: 10 mg via INTRAVENOUS
  Filled 2016-11-04: qty 10

## 2016-11-04 MED ORDER — IPRATROPIUM-ALBUTEROL 0.5-2.5 (3) MG/3ML IN SOLN
3.0000 mL | Freq: Once | RESPIRATORY_TRACT | Status: DC
  Filled 2016-11-04: qty 3

## 2016-11-04 NOTE — Telephone Encounter (Signed)
Patient called requesting z pack for COPD flare.  Scott Rollins states that is what Dr Anitra Lauth usually provides him for these flares.  I advised patient that Dr Anitra Lauth was out of office until the morning and Scott Rollins states that is okay.  Please advise.

## 2016-11-04 NOTE — Telephone Encounter (Signed)
Azithromycin and prednisone eRx'd. He needs to come in for o/v if not significantly improving in 3-4 days.-thx

## 2016-11-04 NOTE — ED Provider Notes (Signed)
[facility] DEPT Provider Note   CSN: [id number] Arrival date & time: 11/04/16  2226     History   Chief Complaint Chief Complaint  Patient presents with  . Chest Pain    HPI Scott Rollins is a 66 y.o. male.  The history is provided by the patient.  Shortness of Breath  This is a new problem. The average episode lasts 3 days. The problem occurs continuously.The current episode started more than 2 days ago. The problem has been gradually worsening. Associated symptoms include a fever, cough, wheezing and chest pain. Pertinent negatives include no headaches, no coryza, no rhinorrhea, no sore throat, no swollen glands, no ear pain, no neck pain, no sputum production, no hemoptysis, no orthopnea, no syncope, no vomiting, no abdominal pain, no rash, no leg pain, no leg swelling and no claudication. It is unknown what precipitated the problem. He has tried nothing for the symptoms. The treatment provided no relief. He has had no prior ED visits. Associated medical issues include COPD and CAD.    Past Medical History:  Diagnosis Date  . Anxiety   . 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 COPD (chronic obstructive pulmonary disease) ([place])    "pearl study" pt thinks he is on Symbicort  . Coronary atherosclerosis of unspecified type of vessel, native or graft 2005   MI, s/p PCI with stent (pt reports 20+ interventions in the past, most recent cat 6/08 showed patent stents).  Normal LV function.  Nuclear stress test NEG 8/09.  . Depression    ?bipolar dx by psychiatrist?  . Diverticulosis 2006  . ED (erectile dysfunction)   . Gouty arthritis    Dr. Amil Amen  . Hematochezia 09/2012   Hyperplastic polyp, diverticulosis, and internal hemorrhoids found on colonoscopy 10/2012  . Hyperlipidemia    Hx of multiple statin intolerance  . Hypertension   . 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 Myocardial infarction ([place])    x 2 or 3  .  Neuropathic pain of both feet 2015/2016   Vit B12 and A1c normal 10/2014  . Obesities, morbid ([place])   . OSA on CPAP    trying to get back on CPAP as of 07/2013  . Osteoarthrosis, unspecified whether generalized or localized, unspecified site    DDD  . Prediabetes 2016/17  . Prostate cancer ([place]) 09/2010   Localized, high risk disease (Dr. Kimbrough/Grapey):  s/p 5 wks ext bm rad + seed boost, as well as hormone blockade x 1 yr.  Biochemical recurrence 11/2015--urologist did bone scan and spots on T spine and L iliac region came up, pt needs plain films of pelvis/T spine to see if DJD correlate present.  He'll eventually need to resume androgen depriv therapy.  . Radiation    Pelvic--for prostate ca: 25 treatments/01/2011  . Seizure disorder ([place]) 08-15-2011   Deja vu sensations: no w/u.  These stopped when neurontin 300mg  tid was started for a different reason.  . Status post chemotherapy    10/2010 thru 06/2011    Patient Active Problem List   Diagnosis Date Noted  . COPD with acute exacerbation ([place]) 11/04/2016  . 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 COPD (chronic obstructive pulmonary disease) ([place]) 04/19/2014  . Chronic pain syndrome 01/11/2014  . Acute exacerbation of chronic obstructive pulmonary disease (COPD) ([place]) 07/04/2013  . Chronic gout 04/22/2013  . Health maintenance examination 04/29/2012  . Prostate cancer ([place]) 09/12/2010  . MIXED HYPERLIPIDEMIA  08/27/2010  . ELEVATED PROSTATE SPECIFIC ANTIGEN 08/26/2010  . 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 HEMATURIA, HX OF 08/21/2010  . TOBACCO ABUSE 07/11/2010  . NUMMULAR ECZEMA 06/24/2010  . EMPHYSEMA 04/16/2009  . DYSPNEA 04/10/2009  . OBESITY 04/09/2009  . HYPERTENSION 04/09/2009  . CORONARY ARTERY DISEASE 04/09/2009  . DEGENERATIVE JOINT DISEASE 04/09/2009    Past Surgical History:  Procedure Laterality Date  . CARDIAC CATHETERIZATION  23 caths   pt states he has 6 stents  . COLONOSCOPY WITH PROPOFOL N/A 04/05/2013     Dr. Fuller Plan.  Polypectomy (hyperplastic--recall 10 yrs).  Moderate diverticulosis, +internal hemorrhoids.  No radiation proctitis.  Marland Kitchen HERNIA REPAIR  yrs ago   [employer]  . HOT HEMOSTASIS N/A 04/05/2013   Procedure: HOT HEMOSTASIS (ARGON PLASMA COAGULATION/BICAP);  Surgeon: Ladene Artist, MD;  Location: [facility];  Service: Endoscopy;  Laterality: N/A;  . seed implants     02/24/2011/ in prostate       Home Medications    Prior to Admission medications   Medication Sig Start Date End Date Taking? Authorizing Provider  acyclovir (ZOVIRAX) 400 MG tablet 1 tab po tid x 5d for acute episode of fever blisters 11/13/15   [name], MD  albuterol (PROAIR HFA) 108 (90 Base) MCG/ACT inhaler Inhale 2 puffs into the lungs every 4 (four) hours as needed. 10/30/16   [name], MD  albuterol (PROVENTIL) (2.5 MG/3ML) 0.083% nebulizer solution USE ONE VIAL IN NEBULIZER EVERY 4 HOURS AS NEEDED 10/30/16   [name], MD  allopurinol (ZYLOPRIM) 300 MG tablet Take 1 tablet (300 mg total) by mouth daily. 11/13/15   [name], MD  aspirin 325 MG tablet Take 325 mg by mouth daily.      Historical Provider, MD  azithromycin (ZITHROMAX) 250 MG tablet 2 tabs po qd x 1d, then 1 tab po qd x 4d 11/04/16   [name], MD  bisoprolol-hydrochlorothiazide [facility]) 5-6.25 MG tablet Take 1 tablet by mouth every morning. 11/13/15   [name], MD  Budesonide (PULMICORT FLEXHALER) 90 MCG/ACT inhaler Inhale 2 puffs into the lungs 2 (two) times daily.    Historical Provider, MD  citalopram (CELEXA) 40 MG tablet Take 1 tablet (40 mg total) by mouth at bedtime. 11/13/15   [name], MD  colchicine 0.6 MG tablet Take 1 tablet (0.6 mg total) by mouth 2 (two) times daily as needed (gout flare ups). 05/18/13   [name], MD  cyclobenzaprine (FLEXERIL) 10 MG tablet TAKE ONE TABLET BY MOUTH ONCE DAILY 07/22/16   [name], MD  furosemide (LASIX) 40 MG tablet Take 2 tablets (80 mg total) by  mouth daily as needed. 11/13/15   [name], MD  gabapentin (NEURONTIN) 300 MG capsule 2 tabs po tid 11/13/15   [name], MD  hydrochlorothiazide (HYDRODIURIL) 25 MG tablet TAKE ONE TABLET BY MOUTH ONCE DAILY IN THE MORNING 07/22/16   [name], MD  lisinopril (PRINIVIL,ZESTRIL) 20 MG tablet 1 tab po bid 11/13/15   [name], MD  Oxycodone HCl 10 MG TABS 1-2 tabs po bid prn pain 10/30/16   [name], MD  predniSONE (DELTASONE) 20 MG tablet 2 tabs po qd x 5d, then 1 tab po qd x 5d 11/04/16   [name], MD  Probiotic Product (PROBIOTIC PO) Take 1 capsule by mouth daily.    Historical Provider, MD  terazosin (HYTRIN) 2 MG capsule Take 1 capsule (  2 mg total) by mouth at bedtime. 11/13/15   [name], MD  zolpidem (AMBIEN) 10 MG tablet Take 1 tablet (10 mg total) by mouth at bedtime as needed for sleep. 10/30/16   [name], MD    Family History Family History  Problem Relation Age of Onset  . Arthritis Mother   . Heart disease Father   . Bipolar disorder Father     Social History Social History  Substance Use Topics  . Smoking status: Current Every Day Smoker    Packs/day: 1.00    Years: 46.00    Types: Cigarettes    Last attempt to quit: 07/18/2010  . Smokeless tobacco: Never Used  . Alcohol use Yes     Comment: occasional     Allergies   Amitriptyline hcl and Ezetimibe   Review of Systems Review of Systems  Constitutional: Positive for chills and fever.  HENT: Negative for ear pain, rhinorrhea and sore throat.   Eyes: Negative for pain and visual disturbance.  Respiratory: Positive for cough, chest tightness, shortness of breath and wheezing. Negative for hemoptysis and sputum production.   Cardiovascular: Positive for chest pain. Negative for palpitations, orthopnea, claudication, leg swelling and syncope.  Gastrointestinal: Negative for abdominal pain and vomiting.  Genitourinary: Negative for dysuria and hematuria.    Musculoskeletal: Negative for arthralgias, back pain and neck pain.  Skin: Negative for color change and rash.  Neurological: Negative for seizures, syncope and headaches.  All other systems reviewed and are negative.    Physical Exam Updated Vital Signs BP (!) 143/95   Pulse 71   Temp (!) 101.2 F (38.4 C)   Resp 20   Wt 128.4 kg   SpO2 98%   BMI 40.03 kg/m   Physical Exam  Constitutional: He appears well-developed and well-nourished. He appears ill. He appears distressed.  HENT:  Head: Normocephalic and atraumatic.  Mouth/Throat: Mucous membranes are dry.  Eyes: Conjunctivae are normal.  Neck: Neck supple.  Cardiovascular: An irregularly irregular rhythm present. Tachycardia present.   No murmur heard. Pulmonary/Chest: Accessory muscle usage present. Tachypnea noted. No respiratory distress. He has wheezes in the right upper field, the right middle field, the right lower field, the left upper field, the left middle field and the left lower field. He has rhonchi in the right lower field and the left lower field.  Abdominal: Soft. There is no tenderness.  Musculoskeletal: He exhibits no edema.  Neurological: He is alert. No cranial nerve deficit or sensory deficit. GCS eye subscore is 4. GCS verbal subscore is 5. GCS motor subscore is 6.  Skin: Skin is warm and dry.  Psychiatric: He has a normal mood and affect.  Nursing note and vitals reviewed.    ED Treatments / Results  Labs (all labs ordered are listed, but only abnormal results are displayed) Labs Reviewed  I-STAT CHEM 8, ED - Abnormal; Notable for the following:       Result Value   Sodium 131 (*)    Chloride 93 (*)    BUN 28 (*)    Creatinine, Ser 1.30 (*)    Glucose, Bld 133 (*)    Calcium, Ion 0.96 (*)    All other components within normal limits  I-STAT CG4 LACTIC ACID, ED - Abnormal; Notable for the following:    Lactic Acid, Venous 2.74 (*)    All other components within normal limits  I-STAT  VENOUS BLOOD GAS, ED - Abnormal; Notable for the following:  Bicarbonate 30.5 (*)    Acid-Base Excess 4.0 (*)    All other components within normal limits  CULTURE, BLOOD (ROUTINE X 2)  CULTURE, BLOOD (ROUTINE X 2)  CBC WITH DIFFERENTIAL/PLATELET  BLOOD GAS, VENOUS  COMPREHENSIVE METABOLIC PANEL  MAGNESIUM  BRAIN NATRIURETIC PEPTIDE  I-STAT TROPOININ, ED    EKG  EKG Interpretation  Date/Time:  Tuesday November 04 2016 22:33:30 EDT Ventricular Rate:  130 PR Interval:    QRS Duration: 95 QT Interval:  335 QTC Calculation: 493 R Axis:   89 Text Interpretation:  undetermined rhythm, ? a fib and sinus rhythm Borderline right axis deviation Borderline prolonged QT interval tachycardia new since prior tracing Confirmed by KNAPP  MD-J, JON ([id number]) on 11/04/2016 10:37:16 PM       Radiology Dg Chest Portable 1 View  Result Date: 11/04/2016 CLINICAL DATA:  Chest pain x3 days worsening with dyspnea. Atrial fibrillation. Wheezing. EXAM: PORTABLE CHEST 1 VIEW COMPARISON:  None. FINDINGS: Stable cardiomegaly. No aortic aneurysm. 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 Subtle opacities in the right upper and lower lobes may represent changes of pneumonia. No effusion or pneumothorax. No acute osseous abnormality. IMPRESSION: Stable cardiomegaly. Subtle airspace opacities in right upper and lower lobe raise the possibility of pneumonia. Electronically Signed   By: Ashley Royalty M.D.   On: 11/04/2016 23:02    Procedures Procedures (including critical care time)  Medications Ordered in ED Medications  ipratropium-albuterol (DUONEB) 0.5-2.5 (3) MG/3ML nebulizer solution 3 mL (3 mLs Nebulization Not Given 11/04/16 2249)  aspirin chewable tablet 324 mg (324 mg Oral Not Given 11/04/16 2236)  azithromycin (ZITHROMAX) 500 mg in dextrose 5 % 250 mL IVPB (500 mg Intravenous New Bag/Given 11/04/16 2301)  sodium chloride 0.9 % bolus 1,000 mL (1,000 mLs Intravenous New Bag/Given 11/04/16 2300)   cefTRIAXone (ROCEPHIN) 1 g in dextrose 5 % 50 mL IVPB (1 g Intravenous New Bag/Given 11/04/16 2300)  diltiazem (CARDIZEM) 1 mg/mL load via infusion 10 mg (10 mg Intravenous Bolus from Bag 11/04/16 2309)  ondansetron (ZOFRAN) injection 4 mg (4 mg Intravenous Given 11/04/16 2310)  acetaminophen (TYLENOL) tablet 1,000 mg (1,000 mg Oral Given 11/04/16 2313)     Initial Impression / Assessment and Plan / ED Course  I have reviewed the triage vital signs and the nursing notes.  Pertinent labs & imaging results that were available during my care of the patient were reviewed by me and considered in my medical decision making (see chart for details).     66 year old male with history of COPD, CHF presents in the setting of shortness of breath, cough, fever. Patient reports this is been worsening over the last 3 days. On arrival of EMS patient was found to have decreased oxygen saturations with oxygen in the low 80s. Patient with wheezing in all lung fields and given albuterol treatment in route.  On arrival patient found to be hemodynamically stable but with a fever. Patient given Tylenol. Patient with mild wheezing and increased work of breathing. Patient with rhonchi in bases and with history of CHF started on BiPAP. Patient with significant improvement in work of breathing and oxygen levels after BiPAP. Patient reports left sided chest pain which is 310 in nature. EKG without signs of acute ischemia and troponin was not elevated. No significant electrolyte abnormalities or anemia noted. Chest x-ray consistent with pneumonia and patient given ceftriaxone and a Zithromax in setting of community acquired pneumonia. Blood cultures were obtained and patient does have lactic acidosis however  due to heart failure and normal blood pressure do not believe patient requires 30 mL per KG of IV fluids but was given normal saline bolus. EKG concerning for atrial fibrillation versus M a T. Patient initially started on  diltiazem for this which was discontinued after discussion with hospitalist team.  On reassessment patient reports significant improvement and all symptoms. Patient will be admitted to medicine team for further management of this condition and stable at time of transfer of care.  Final Clinical Impressions(s) / ED Diagnoses   Final diagnoses:  Community acquired pneumonia, unspecified laterality  Tachypnea  SOB (shortness of breath)  Hypoxemia  Atrial fibrillation with rapid ventricular response [facility])    New Prescriptions New Prescriptions   No medications on file     Esaw Grandchild, MD 11/04/16 2359    Dorie Rank, MD 11/06/16 1010

## 2016-11-04 NOTE — ED Triage Notes (Signed)
Pt comes via [facility], c/o CP that has been going on for three days, today worse with SOB, nausea, no vomiting, speaking in short sentences upon EMS arrival, PTA took 324 ASA, nitro x 1, afib on monitor , recent diagnosis. Wheezing in all lobes, 10 albuterol, 0.5 atrovent, relieved CP from 7 to a 3. Hx of COPD, and CHF

## 2016-11-05 ENCOUNTER — Encounter (HOSPITAL_COMMUNITY): Payer: Self-pay | Admitting: Family Medicine

## 2016-11-05 DIAGNOSIS — J441 Chronic obstructive pulmonary disease with (acute) exacerbation: Principal | ICD-10-CM

## 2016-11-05 DIAGNOSIS — C61 Malignant neoplasm of prostate: Secondary | ICD-10-CM

## 2016-11-05 DIAGNOSIS — J189 Pneumonia, unspecified organism: Secondary | ICD-10-CM | POA: Diagnosis present

## 2016-11-05 DIAGNOSIS — J181 Lobar pneumonia, unspecified organism: Secondary | ICD-10-CM

## 2016-11-05 DIAGNOSIS — I10 Essential (primary) hypertension: Secondary | ICD-10-CM

## 2016-11-05 DIAGNOSIS — I2510 Atherosclerotic heart disease of native coronary artery without angina pectoris: Secondary | ICD-10-CM

## 2016-11-05 DIAGNOSIS — G894 Chronic pain syndrome: Secondary | ICD-10-CM

## 2016-11-05 DIAGNOSIS — E871 Hypo-osmolality and hyponatremia: Secondary | ICD-10-CM

## 2016-11-05 DIAGNOSIS — I2583 Coronary atherosclerosis due to lipid rich plaque: Secondary | ICD-10-CM

## 2016-11-05 DIAGNOSIS — G4733 Obstructive sleep apnea (adult) (pediatric): Secondary | ICD-10-CM | POA: Diagnosis present

## 2016-11-05 LAB — COMPREHENSIVE METABOLIC PANEL
ALBUMIN: 3.3 g/dL — AB (ref 3.5–5.0)
ALBUMIN: 3.2 g/dL — AB (ref 3.5–5.0)
ALK PHOS: 122 U/L (ref 38–126)
ALT: 16 U/L — ABNORMAL LOW (ref 17–63)
ALT: 19 U/L (ref 17–63)
ANION GAP: 15 (ref 5–15)
ANION GAP: 14 (ref 5–15)
AST: 28 U/L (ref 15–41)
AST: 32 U/L (ref 15–41)
Alkaline Phosphatase: 123 U/L (ref 38–126)
BILIRUBIN TOTAL: 0.8 mg/dL (ref 0.3–1.2)
BILIRUBIN TOTAL: 0.7 mg/dL (ref 0.3–1.2)
BUN: 21 mg/dL — ABNORMAL HIGH (ref 6–20)
BUN: 21 mg/dL — AB (ref 6–20)
CO2: 25 mmol/L (ref 22–32)
CO2: 26 mmol/L (ref 22–32)
Calcium: 8.5 mg/dL — ABNORMAL LOW (ref 8.9–10.3)
Calcium: 8.6 mg/dL — ABNORMAL LOW (ref 8.9–10.3)
Chloride: 90 mmol/L — ABNORMAL LOW (ref 101–111)
Chloride: 95 mmol/L — ABNORMAL LOW (ref 101–111)
Creatinine, Ser: 1.2 mg/dL (ref 0.61–1.24)
Creatinine, Ser: 1.23 mg/dL (ref 0.61–1.24)
GFR calc Af Amer: >60 mL/min (ref >60)
GFR calc Af Amer: >60 mL/min (ref >60)
GFR calc non Af Amer: >60 mL/min (ref >60)
GFR calc non Af Amer: 59 mL/min — ABNORMAL LOW (ref >60)
GLUCOSE: 125 mg/dL — AB (ref 65–99)
GLUCOSE: 168 mg/dL — AB (ref 65–99)
POTASSIUM: 3.5 mmol/L (ref 3.5–5.1)
POTASSIUM: 3.8 mmol/L (ref 3.5–5.1)
SODIUM: 130 mmol/L — AB (ref 135–145)
SODIUM: 135 mmol/L (ref 135–145)
TOTAL PROTEIN: 6.9 g/dL (ref 6.5–8.1)
TOTAL PROTEIN: 6.7 g/dL (ref 6.5–8.1)

## 2016-11-05 LAB — CBC
HCT: 46.2 % (ref 39.0–52.0)
HEMOGLOBIN: 15.2 g/dL (ref 13.0–17.0)
MCH: 28.3 pg (ref 26.0–34.0)
MCHC: 32.9 g/dL (ref 30.0–36.0)
MCV: 86 fL (ref 78.0–100.0)
Platelets: 221 10*3/uL (ref 150–400)
RBC: 5.37 MIL/uL (ref 4.22–5.81)
RDW: 14.9 % (ref 11.5–15.5)
WBC: 6.5 10*3/uL (ref 4.0–10.5)

## 2016-11-05 LAB — BRAIN NATRIURETIC PEPTIDE: B Natriuretic Peptide: 57.2 pg/mL (ref 0.0–100.0)

## 2016-11-05 LAB — MAGNESIUM
MAGNESIUM: 1.7 mg/dL (ref 1.7–2.4)
MAGNESIUM: 2 mg/dL (ref 1.7–2.4)

## 2016-11-05 LAB — HIV ANTIBODY (ROUTINE TESTING W REFLEX): HIV SCREEN 4TH GENERATION: NONREACTIVE

## 2016-11-05 LAB — EXPECTORATED SPUTUM ASSESSMENT W REFEX TO RESP CULTURE

## 2016-11-05 LAB — TSH: TSH: 0.444 u[IU]/mL (ref 0.350–4.500)

## 2016-11-05 LAB — EXPECTORATED SPUTUM ASSESSMENT W GRAM STAIN, RFLX TO RESP C

## 2016-11-05 LAB — MRSA PCR SCREENING: MRSA BY PCR: NEGATIVE

## 2016-11-05 LAB — PROCALCITONIN: Procalcitonin: 0.46 ng/mL

## 2016-11-05 MED ORDER — IPRATROPIUM BROMIDE 0.02 % IN SOLN
0.5000 mg | Freq: Four times a day (QID) | RESPIRATORY_TRACT | Status: DC
  Administered 2016-11-05: 0.5 mg via RESPIRATORY_TRACT
  Filled 2016-11-05: qty 2.5

## 2016-11-05 MED ORDER — ACETAMINOPHEN 650 MG RE SUPP: 650.0000 mg | Freq: Four times a day (QID) | RECTAL | Status: DC | PRN

## 2016-11-05 MED ORDER — DEXTROSE 5 % IV SOLN: 1.0000 g | Freq: Once | INTRAVENOUS | Status: DC

## 2016-11-05 MED ORDER — IPRATROPIUM-ALBUTEROL 0.5-2.5 (3) MG/3ML IN SOLN
3.0000 mL | Freq: Four times a day (QID) | RESPIRATORY_TRACT | Status: DC
  Administered 2016-11-05 (×3): 3 mL via RESPIRATORY_TRACT
  Filled 2016-11-05 (×3): qty 3

## 2016-11-05 MED ORDER — HYDROCHLOROTHIAZIDE 25 MG PO TABS
25.0000 mg | ORAL_TABLET | Freq: Every day | ORAL | Status: DC
  Administered 2016-11-05: 25 mg via ORAL
  Filled 2016-11-05: qty 1

## 2016-11-05 MED ORDER — SODIUM CHLORIDE 0.9 % IV SOLN
INTRAVENOUS | Status: DC
  Administered 2016-11-05: 02:00:00 via INTRAVENOUS

## 2016-11-05 MED ORDER — CITALOPRAM HYDROBROMIDE 40 MG PO TABS
40.0000 mg | ORAL_TABLET | Freq: Every day | ORAL | Status: DC
  Administered 2016-11-05 – 2016-11-06 (×2): 40 mg via ORAL
  Filled 2016-11-05: qty 1
  Filled 2016-11-05: qty 2

## 2016-11-05 MED ORDER — TERAZOSIN HCL 2 MG PO CAPS
2.0000 mg | ORAL_CAPSULE | Freq: Every day | ORAL | Status: DC
  Administered 2016-11-05 – 2016-11-06 (×2): 2 mg via ORAL
  Filled 2016-11-05 (×2): qty 1

## 2016-11-05 MED ORDER — ALBUTEROL SULFATE (2.5 MG/3ML) 0.083% IN NEBU: 2.5000 mg | INHALATION_SOLUTION | RESPIRATORY_TRACT | Status: DC | PRN

## 2016-11-05 MED ORDER — ZOLPIDEM TARTRATE 5 MG PO TABS
5.0000 mg | ORAL_TABLET | Freq: Every evening | ORAL | Status: DC | PRN
  Administered 2016-11-06: 5 mg via ORAL
  Filled 2016-11-05: qty 1

## 2016-11-05 MED ORDER — ACETAMINOPHEN 325 MG PO TABS
650.0000 mg | ORAL_TABLET | Freq: Four times a day (QID) | ORAL | Status: DC | PRN
  Administered 2016-11-06: 650 mg via ORAL
  Filled 2016-11-05: qty 2

## 2016-11-05 MED ORDER — METHYLPREDNISOLONE SODIUM SUCC 40 MG IJ SOLR
40.0000 mg | Freq: Two times a day (BID) | INTRAMUSCULAR | Status: DC
  Administered 2016-11-05 – 2016-11-06 (×5): 40 mg via INTRAVENOUS
  Filled 2016-11-05 (×5): qty 1

## 2016-11-05 MED ORDER — ENOXAPARIN SODIUM 60 MG/0.6ML ~~LOC~~ SOLN
60.0000 mg | SUBCUTANEOUS | Status: DC
  Administered 2016-11-05 – 2016-11-06 (×2): 60 mg via SUBCUTANEOUS
  Filled 2016-11-05 (×2): qty 0.6

## 2016-11-05 MED ORDER — BISOPROLOL-HYDROCHLOROTHIAZIDE 5-6.25 MG PO TABS
1.0000 | ORAL_TABLET | Freq: Every morning | ORAL | Status: DC
  Administered 2016-11-05 – 2016-11-07 (×3): 1 via ORAL
  Filled 2016-11-05 (×3): qty 1

## 2016-11-05 MED ORDER — OXYCODONE HCL 5 MG PO TABS
10.0000 mg | ORAL_TABLET | Freq: Two times a day (BID) | ORAL | Status: DC | PRN
  Administered 2016-11-05 – 2016-11-06 (×2): 10 mg via ORAL
  Filled 2016-11-05 (×2): qty 2

## 2016-11-05 MED ORDER — GUAIFENESIN-DM 100-10 MG/5ML PO SYRP
5.0000 mL | ORAL_SOLUTION | ORAL | Status: DC | PRN
  Administered 2016-11-05: 5 mL via ORAL
  Filled 2016-11-05: qty 5

## 2016-11-05 MED ORDER — ALBUTEROL SULFATE (2.5 MG/3ML) 0.083% IN NEBU
2.5000 mg | INHALATION_SOLUTION | Freq: Four times a day (QID) | RESPIRATORY_TRACT | Status: DC
  Administered 2016-11-05: 2.5 mg via RESPIRATORY_TRACT
  Filled 2016-11-05: qty 3

## 2016-11-05 MED ORDER — DEXTROSE 5 % IV SOLN: 250.0000 mg | Freq: Once | INTRAVENOUS | Status: DC

## 2016-11-05 MED ORDER — PNEUMOCOCCAL VAC POLYVALENT 25 MCG/0.5ML IJ INJ
0.5000 mL | INJECTION | INTRAMUSCULAR | Status: AC
  Administered 2016-11-07: 0.5 mL via INTRAMUSCULAR
  Filled 2016-11-05 (×2): qty 0.5

## 2016-11-05 MED ORDER — ASPIRIN 325 MG PO TABS
325.0000 mg | ORAL_TABLET | Freq: Every day | ORAL | Status: DC
  Administered 2016-11-05 – 2016-11-07 (×3): 325 mg via ORAL
  Filled 2016-11-05 (×3): qty 1

## 2016-11-05 MED ORDER — DEXTROSE 5 % IV SOLN
1.0000 g | INTRAVENOUS | Status: DC
  Administered 2016-11-05 – 2016-11-06 (×2): 1 g via INTRAVENOUS
  Filled 2016-11-05 (×2): qty 10

## 2016-11-05 MED ORDER — LISINOPRIL 20 MG PO TABS
20.0000 mg | ORAL_TABLET | Freq: Two times a day (BID) | ORAL | Status: DC
Start: 2016-11-05 — End: 2016-11-07
  Administered 2016-11-05 – 2016-11-07 (×5): 20 mg via ORAL
  Filled 2016-11-05 (×7): qty 1

## 2016-11-05 MED ORDER — DEXTROSE 5 % IV SOLN
500.0000 mg | INTRAVENOUS | Status: DC
  Administered 2016-11-05 – 2016-11-06 (×2): 500 mg via INTRAVENOUS
  Filled 2016-11-05 (×2): qty 500

## 2016-11-05 NOTE — Telephone Encounter (Signed)
Contacted [facility].   Rx canceled.

## 2016-11-05 NOTE — Progress Notes (Signed)
66 COPD Metastatic prostate cancer with prior history of prostate seed implantation 02/2011 possible bone metastases-on hormonal therapy Marijuana use Moderate diverticulosis on colonoscopy 03/2013 + 6 mm polyp and internal hemorrhoids CAD prior PCI 2004 DES-last 2008 no stenosis last Myoview 2009 without ischemia OSA not on CPAP HTN Chronic pain on opioids  Admitted early 10/2516 shortness fever cough-MAXIMUM TEMPERATURE on admission 11 0.2 respirations 27 sodium 131 CXR right upper lobe and right lower lobe possible pneumonia Started on ceftriaxone and azithromycin and BiPAP and placed on Cardizem for MAT [not afib]   Currently feels somewhat better No chest pain no lower extremity pain no bleeding no nausea no vomiting Has had breakfast  Toleratingdietwellandwashypoxichoweverintothe80soffofoxygen  Keeponstepdowntoday Monitoroverthecourseoftodayonstepdownandprobablycantransferouttomorrow

## 2016-11-05 NOTE — Telephone Encounter (Signed)
Patient is in hospital.  I spoke to patients wife.  Do you want me to call and cancel Rx?

## 2016-11-05 NOTE — Telephone Encounter (Signed)
Yes, thanks

## 2016-11-05 NOTE — Care Management Note (Signed)
Case Management Note  Patient Details  Name: WENTWORTH EDELEN MRN: [id number] Date of Birth: 1951-02-09  Subjective/Objective:     Pt admitted with  SOB              Action/Plan:  PTA independent from home with wife.  Pt does not have medicare D and therefore pays out of pocket for all medications.  Pt states he can pay out of pocket for everything except the Symbicort.  CM provided pt the [employer] phone number and the paper application - CM urged pt to contact them via phone ASAP while in the hospital to determine if he can receive assistance directly from the manufacture.    Expected Discharge Date:                  Expected Discharge Plan:  Home/Self Care  In-House Referral:     Discharge planning Services  CM Consult  Post Acute Care Choice:    Choice offered to:     DME Arranged:    DME Agency:     HH Arranged:    HH Agency:     Status of Service:  In process, will continue to follow  If discussed at Long Length of Stay Meetings, dates discussed:    Additional Comments:  Maryclare Labrador, RN 11/05/2016, 10:53 AM

## 2016-11-05 NOTE — H&P (Signed)
History and Physical  Patient Name: Scott Rollins     ZGY:[id number]    DOB: 06/27/1951    DOA: 11/04/2016 PCP: [name], MD   Patient coming from: Home  Chief Complaint: SOB, fever  HPI: Scott Rollins is a 66 y.o. male with a past medical history significant for COPD not compliant, prostate cancer, possibly metastatic, CAD s/p remote PCI, OSA not on CPAP, HTN, and chronic pain on daily opioid therapy who presents with 3 days cough and dyspnea and fever.  Patient is his usual state of health until 3 or 4 days ago when he started to develop shortness of breath with walking across the room, nonproductive cough, runny nose, fevers and sweats. He tried taking over-the-counter cold medicines and using his home breathing treatments without relief.  Tonight, his respiratory symptoms worsened, he took a Nitro thinking his SOB was cardiac but this did not help, and so he called 9-1-1.  No orthopnea, leg swelling, weight gain.  No purulent sputum.  No chest pain, pressure, pleuritic discomfort.  ED course: -Temp 101.70F, heart rate 130 and irregular, respirations 27 and labored, BP 137/79, pulse ox good on BiPAP -Na 131, K 3.7, Cr 1.3 (baseline 1.0), WBC 6.9K, Hgb 14.2 -Troponin negative -Lactic acid 2.74 -CXR showed RUL and RLL opacities, possibly pneumonia -ECG showed MAT rate 130s, not atrial fibrillation -He was given ceftriaxone and azithromycin, started on diltiazem, given full aspirin, 1 L of normal saline, and BiPAP and started to improve and so TRH were asked to evaluate for COPD flare and pneumonia     ROS: Review of Systems  Constitutional: Positive for chills, diaphoresis, fever and malaise/fatigue.  HENT: Positive for congestion.   Respiratory: Positive for cough, shortness of breath and wheezing. Negative for sputum production.   Cardiovascular: Positive for palpitations. Negative for chest pain, orthopnea, leg swelling and PND.  All other systems reviewed and are  negative.         Past Medical History:  Diagnosis Date  . Anxiety   . 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 COPD (chronic obstructive pulmonary disease) ([place])    "pearl study" pt thinks he is on Symbicort  . Coronary atherosclerosis of unspecified type of vessel, native or graft 2005   MI, s/p PCI with stent (pt reports 20+ interventions in the past, most recent cat 6/08 showed patent stents).  Normal LV function.  Nuclear stress test NEG 8/09.  . Depression    ?bipolar dx by psychiatrist?  . Diverticulosis 2006  . ED (erectile dysfunction)   . Gouty arthritis    Dr. Amil Amen  . Hematochezia 09/2012   Hyperplastic polyp, diverticulosis, and internal hemorrhoids found on colonoscopy 10/2012  . Hyperlipidemia    Hx of multiple statin intolerance  . Hypertension   . 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 Myocardial infarction ([place])    x 2 or 3  . Neuropathic pain of both feet 2015/2016   Vit B12 and A1c normal 10/2014  . Obesities, morbid ([place])   . OSA on CPAP    trying to get back on CPAP as of 07/2013  . Osteoarthrosis, unspecified whether generalized or localized, unspecified site    DDD  . Prediabetes 2016/17  . Prostate cancer ([place]) 09/2010   Localized, high risk disease (Dr. Kimbrough/[place]):  s/p 5 wks ext bm rad + seed boost, as well as hormone blockade x 1 yr.  Biochemical recurrence 11/2015--urologist  did bone scan and spots on T spine and L iliac region came up, pt needs plain films of pelvis/T spine to see if DJD correlate present.  He'll eventually need to resume androgen depriv therapy.  . Radiation    Pelvic--for prostate ca: 25 treatments/01/2011  . Seizure disorder ([place]) 08-15-2011   Deja vu sensations: no w/u.  These stopped when neurontin 300mg  tid was started for a different reason.  . Status post chemotherapy    10/2010 thru 06/2011    Past Surgical History:  Procedure Laterality Date  . CARDIAC  CATHETERIZATION  23 caths   pt states he has 6 stents  . COLONOSCOPY WITH PROPOFOL N/A 04/05/2013   Dr. Fuller Plan.  Polypectomy (hyperplastic--recall 10 yrs).  Moderate diverticulosis, +internal hemorrhoids.  No radiation proctitis.  Marland Kitchen HERNIA REPAIR  yrs ago   [employer]  . HOT HEMOSTASIS N/A 04/05/2013   Procedure: HOT HEMOSTASIS (ARGON PLASMA COAGULATION/BICAP);  Surgeon: Ladene Artist, MD;  Location: [facility];  Service: Endoscopy;  Laterality: N/A;  . seed implants     02/24/2011/ in prostate    Social History: The patient walks with a cane. He is retired from various things.  He smokes.  He is from [place] originally.   Allergies  Allergen Reactions  . Amitriptyline Hcl     REACTION: nausea, elevated bp  . Ezetimibe     REACTION: chest pain, fatigue    Family history: family history includes Arthritis in his mother; Bipolar disorder in his father; Heart disease in his father.  Prior to Admission medications   Medication Sig Start Date End Date Taking? Authorizing Provider  albuterol (PROAIR HFA) 108 (90 Base) MCG/ACT inhaler Inhale 2 puffs into the lungs every 4 (four) hours as needed. 10/30/16  Yes [name], MD  albuterol (PROVENTIL) (2.5 MG/3ML) 0.083% nebulizer solution USE ONE VIAL IN NEBULIZER EVERY 4 HOURS AS NEEDED 10/30/16  Yes [name], MD  aspirin 325 MG tablet Take 325 mg by mouth daily.     Yes Historical Provider, MD  bisoprolol-hydrochlorothiazide (ZIAC) 5-6.25 MG tablet Take 1 tablet by mouth every morning. 11/13/15  Yes [name], MD  citalopram (CELEXA) 40 MG tablet Take 1 tablet (40 mg total) by mouth at bedtime. 11/13/15  Yes [name], MD  furosemide (LASIX) 40 MG tablet Take 2 tablets (80 mg total) by mouth daily as needed. Patient taking differently: Take 80 mg by mouth daily as needed for fluid.  11/13/15  Yes [name], MD  gabapentin (NEURONTIN) 300 MG capsule 2 tabs po tid Patient taking differently: Take 600 mg by mouth 3  (three) times daily as needed (for nerve pain).  11/13/15  Yes [name], MD  hydrochlorothiazide (HYDRODIURIL) 25 MG tablet TAKE ONE TABLET BY MOUTH ONCE DAILY IN THE MORNING 07/22/16  Yes [name], MD  lisinopril (PRINIVIL,ZESTRIL) 20 MG tablet 1 tab po bid Patient taking differently: Take 20 mg by mouth 2 (two) times daily.  11/13/15  Yes [name], MD  Oxycodone HCl 10 MG TABS 1-2 tabs po bid prn pain Patient taking differently: Take 10 mg by mouth 2 (two) times daily as needed (for pain).  10/30/16  Yes [name], MD  Probiotic Product (PROBIOTIC PO) Take 1 capsule by mouth daily.   Yes Historical Provider, MD  terazosin (HYTRIN) 2 MG capsule Take 1 capsule (2 mg total) by mouth at bedtime. 11/13/15  Yes [name], MD  zolpidem Lorrin Mais)  10 MG tablet Take 1 tablet (10 mg total) by mouth at bedtime as needed for sleep. 10/30/16  Yes [name], MD       Physical Exam: BP (!) 111/59   Pulse 77   Temp (!) 101.2 F (38.4 C)   Resp 20   Wt 128.4 kg (283 lb)   SpO2 94%   BMI 40.03 kg/m  General appearance: Well-developed, obese adult male, alert and in mild distress from dyspnea.   Eyes: Anicteric, conjunctiva pink, lids and lashes normal. PERRL.    ENT: No nasal deformity, discharge, epistaxis.  Hearing normal. OP tacky dry without lesions.   Neck: No neck masses.  Trachea midline.  No thyromegaly/tenderness. Lymph: No cervical or supraclavicular lymphadenopathy. Skin: Warm and dry.  No jaundice.  No suspicious rashes or lesions.  Hypopigmented lesions all over. Cardiac: Tachycardic, irregular, nl S1-S2, no murmurs appreciated.  Capillary refill is brisk.  JVP not visible.  No LE edema.  Radial and DP pulses 2+ and symmetric. Respiratory: Tachpyneic, lung sounds diminished thoughout.  On BiPAP. Abdomen: Abdomen soft.  No TTP. No ascites, distension, hepatosplenomegaly.   MSK: No deformities or effusions.  No cyanosis or clubbing. Neuro: Cranial nerves  normal.  Sensation intact to light touch. Speech is fluent.  Muscle strength normal.    Psych: Sensorium intact and responding to questions, attention normal.  Behavior appropriate.  Affect normal.  Judgment and insight appear normal.     Labs on Admission:  I have personally reviewed following labs and imaging studies: CBC:  Recent Labs Lab 11/04/16 2253 11/04/16 2302  WBC 6.9  --   NEUTROABS 5.3  --   HGB 14.2 15.6  HCT 42.9 46.0  MCV 85.3  --   PLT 213  --    Basic Metabolic Panel:  Recent Labs Lab 10/31/16 0934 11/04/16 2253 11/04/16 2302  NA 141 130* 131*  K 4.3 3.5 3.7  CL 101 90* 93*  CO2 33* 25  --   GLUCOSE 92 125* 133*  BUN 25* 21* 28*  CREATININE 0.97 1.20 1.30*  CALCIUM 9.0 8.5*  --   MG  --  1.7  --    GFR: Estimated Creatinine Clearance: 74.8 mL/min (A) (by C-G formula based on SCr of 1.3 mg/dL (H)).  Liver Function Tests:  Recent Labs Lab 10/31/16 0934 11/04/16 2253  AST 13 28  ALT 15 16*  ALKPHOS 156* 123  BILITOT 0.5 0.8  PROT 6.5 6.9  ALBUMIN 3.8 3.3*   No results for input(s): LIPASE, AMYLASE in the last 168 hours. No results for input(s): AMMONIA in the last 168 hours. Coagulation Profile: No results for input(s): INR, PROTIME in the last 168 hours. Cardiac Enzymes: No results for input(s): CKTOTAL, CKMB, CKMBINDEX, TROPONINI in the last 168 hours. BNP (last 3 results) No results for input(s): PROBNP in the last 8760 hours. HbA1C: No results for input(s): HGBA1C in the last 72 hours. CBG: No results for input(s): GLUCAP in the last 168 hours. Lipid Profile: No results for input(s): CHOL, HDL, LDLCALC, TRIG, CHOLHDL, LDLDIRECT in the last 72 hours. Thyroid Function Tests: No results for input(s): TSH, T4TOTAL, FREET4, T3FREE, THYROIDAB in the last 72 hours. Anemia Panel: No results for input(s): VITAMINB12, FOLATE, FERRITIN, TIBC, IRON, RETICCTPCT in the last 72 hours. Sepsis Labs: Lactic acid 2.74 Invalid input(s):  PROCALCITONIN, LACTICIDVEN No results found for this or any previous visit (from the past 240 hour(s)).       Radiological Exams on Admission:  Personally reviewed CXR shows RUL and RLL pneumonia: Dg Chest Portable 1 View  Result Date: 11/04/2016 CLINICAL DATA:  Chest pain x3 days worsening with dyspnea. Atrial fibrillation. Wheezing. EXAM: PORTABLE CHEST 1 VIEW COMPARISON:  None. FINDINGS: Stable cardiomegaly. No aortic aneurysm. 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 Subtle opacities in the right upper and lower lobes may represent changes of pneumonia. No effusion or pneumothorax. No acute osseous abnormality. IMPRESSION: Stable cardiomegaly. Subtle airspace opacities in right upper and lower lobe raise the possibility of pneumonia. Electronically Signed   By: Ashley Royalty M.D.   On: 11/04/2016 23:02    EKG: Independently reviewed. Rate 130, QTc 493, narrow complex irregular tachycardia with multiple p wave morphologies.          Assessment/Plan  1. COPD with exacerbation:  Mild.   -Solumedrol 40 BID -Bronchodilators, scheduled and PRN -Smoking cessation advised -COPD bundle   2. Community-acquired pneumonia right upper and right lower lobes:  -Ceftriaxone and azithromycin -Check procalcitonin -Obtain sputum culture, HIV, S pneumo urine Ag  3. OSA noncompliant with CPAP:  -CPAP at night  4. Hypertension and coronary disease:  -Continue aspirin 325 -Continue bisoprolol, HCTZ, terazosin, lisinopril  5. Hyponatremia:  Mild  6. Arrhythmia:  Suspect this is MAT in setting of COPD/untreated OSA/Obesity. -Check Mag and TSH  7. Other medications:  -Continue citalopram -Uses gabapentin PRN     DVT prophylaxis: Lovenox  Code Status: FULL  Family Communication: None present  Disposition Plan: Anticipate IV antibiotics, IV steroids and bronchodilators and monitor for clinical improvement. Consults called: None Admission status:  INPATIENT          Medical decision making: Patient seen at 12:05 AM on 11/05/2016.  The patient was discussed with Dr. Jearld Pies.  What exists of the patient's chart was reviewed in depth and summarized above.  Clinical condition: improved with ED treatment, likely off BiPAP overnight.        Edwin Dada [facility] Pager [phone]      At the time of admission, it appears that the appropriate admission status for this patient is INPATIENT. This is judged to be reasonable and necessary in order to provide the required intensity of service to ensure the patient's safety given the presenting symptoms, physical exam findings, and initial radiographic and laboratory data in the context of their chronic comorbidities.  Together, these circumstances are felt to place him at high risk for further clinical deterioration threatening life, limb, or organ.   Patient requires inpatient status due to high intensity of service, high risk for further deterioration and high frequency of surveillance required because of this severe exacerbation of their chronic organ failure.  I certify that at the point of admission it is my clinical judgment that the patient will require inpatient hospital care spanning beyond 2 midnights from the point of admission and that early discharge would result in unnecessary risk of decompensation and readmission or threat to life, limb or bodily function.

## 2016-11-06 MED ORDER — IPRATROPIUM-ALBUTEROL 0.5-2.5 (3) MG/3ML IN SOLN
3.0000 mL | Freq: Three times a day (TID) | RESPIRATORY_TRACT | Status: DC
  Administered 2016-11-06 – 2016-11-07 (×4): 3 mL via RESPIRATORY_TRACT
  Filled 2016-11-06 (×5): qty 3

## 2016-11-06 MED ORDER — BUDESONIDE 0.5 MG/2ML IN SUSP
0.5000 mg | Freq: Two times a day (BID) | RESPIRATORY_TRACT | Status: DC
  Administered 2016-11-06 – 2016-11-07 (×3): 0.5 mg via RESPIRATORY_TRACT
  Filled 2016-11-06 (×3): qty 2

## 2016-11-06 NOTE — Progress Notes (Signed)
Scott Rollins:[id number] DOB: 11-29-1950 DOA: 11/04/2016 PCP: [name], MD  Brief narrative:  39 COPD Metastatic prostate cancer with prior history of prostate seed implantation 02/2011 possible bone metastases-on hormonal therapy Marijuana use Moderate diverticulosis on colonoscopy 03/2013 + 6 mm polyp and internal hemorrhoids CAD prior PCI 2004 DES-last 2008 no stenosis last Myoview 2009 without ischemia OSA not on CPAP HTN Chronic pain on opioids  Admitted early 10/2516 shortness fever cough-MAXIMUM TEMPERATURE on admission 11 0.2 respirations 27 sodium 131 CXR right upper lobe and right lower lobe possible pneumonia Started on ceftriaxone and azithromycin and BiPAP and placed on Cardizem for MAT [not afib]  Past medical history-As per Problem list Chart reviewed as below-   Consultants:  none  Procedures:  none  Antibiotics:  Azithromycin 4/26  Ceftriaxone 4/26   Subjective  Fair eating well Main issue pains in LE but not severe No cp No chills no rigors   Objective    Interim History:   Telemetry: sinus   Objective: Vitals:   11/06/16 0320 11/06/16 0400 11/06/16 0800 11/06/16 0822  BP: 135/75 (!) 148/81 (!) 146/76   Pulse: 70 66 69   Resp: (!) _0 Temp: 98.6 F (37 C)  97.7 F (36.5 C)   TempSrc: Oral  Oral   SpO2: 90% 91% 91% 93%  Weight:      Height:        Intake/Output Summary (Last 24 hours) at 11/06/16 0834 Last data filed at 11/06/16 0700  Gross per 24 hour  Intake             3320 ml  Output             1200 ml  Net             2120 ml    Exam:  General: eomi ncat Cardiovascular: s1 s2 no m/r/g Respiratory: wheezy bilaterally.  Decreased AE Abdomen:  Soft nt nd obese no rebound Skin no le edema  Neuro intact  Data Reviewed: Basic Metabolic Panel:  Recent Labs Lab 10/31/16 0934 11/04/16 2253 11/04/16 2302 11/05/16 0609  NA 141 130* 131* 135  K 4.3 3.5 3.7 3.8  CL 101 90* 93* 95*  CO2 33*  25  --  26  GLUCOSE 92 125* 133* 168*  BUN 25* 21* 28* 21*  CREATININE 0.97 1.20 1.30* 1.23  CALCIUM 9.0 8.5*  --  8.6*  MG  --  1.7  --  2.0   Liver Function Tests:  Recent Labs Lab 10/31/16 0934 11/04/16 2253 11/05/16 0609  AST 13 28 32  ALT 15 16* 19  ALKPHOS 156* 123 122  BILITOT 0.5 0.8 0.7  PROT 6.5 6.9 6.7  ALBUMIN 3.8 3.3* 3.2*   No results for input(s): LIPASE, AMYLASE in the last 168 hours. No results for input(s): AMMONIA in the last 168 hours. CBC:  Recent Labs Lab 11/04/16 2253 11/04/16 2302 11/05/16 0609  WBC 6.9  --  6.5  NEUTROABS 5.3  --   --   HGB 14.2 15.6 15.2  HCT 42.9 46.0 46.2  MCV 85.3  --  86.0  PLT 213  --  221   Cardiac Enzymes: No results for input(s): CKTOTAL, CKMB, CKMBINDEX, TROPONINI in the last 168 hours. BNP: Invalid input(s): POCBNP CBG: No results for input(s): GLUCAP in the last 168 hours.  Recent Results (from the past 240 hour(s))  MRSA PCR Screening     Status: None  Collection Time: 11/05/16  1:47 AM  Result Value Ref Range Status   MRSA by PCR NEGATIVE NEGATIVE Final    Comment:        The GeneXpert MRSA Assay (FDA approved for NASAL specimens only), is one component of a comprehensive MRSA colonization surveillance program. It is not intended to diagnose MRSA infection nor to guide or monitor treatment for MRSA infections.   Culture, sputum-assessment     Status: None   Collection Time: 11/05/16  9:43 AM  Result Value Ref Range Status   Specimen Description SPUTUM  Final   Special Requests NONE  Final   Sputum evaluation THIS SPECIMEN IS ACCEPTABLE FOR SPUTUM CULTURE  Final   Report Status 11/05/2016 FINAL  Final  Culture, respiratory (NON-Expectorated)     Status: None (Preliminary result)   Collection Time: 11/05/16  9:43 AM  Result Value Ref Range Status   Specimen Description SPUTUM  Final   Special Requests NONE Reflexed from [id number]  Final   Gram Stain   Final    RARE WBC PRESENT, PREDOMINANTLY  MONONUCLEAR FEW GRAM POSITIVE COCCI IN PAIRS FEW GRAM POSITIVE RODS RARE GRAM NEGATIVE RODS    Culture PENDING  Incomplete   Report Status PENDING  Incomplete     Studies:              All Imaging reviewed and is as per above notation   Scheduled Meds: . aspirin  325 mg Oral Daily  . bisoprolol-hydrochlorothiazide  1 tablet Oral q morning - 10a  . budesonide  0.5 mg Nebulization BID  . citalopram  40 mg Oral QHS  . enoxaparin (LOVENOX) injection  60 mg Subcutaneous Q24H  . hydrochlorothiazide  25 mg Oral Daily  . ipratropium-albuterol  3 mL Nebulization TID  . lisinopril  20 mg Oral BID  . methylPREDNISolone (SOLU-MEDROL) injection  40 mg Intravenous Q12H  . pneumococcal 23 valent vaccine  0.5 mL Intramuscular Tomorrow-1000  . terazosin  2 mg Oral QHS   Continuous Infusions: . sodium chloride 100 mL/hr at 11/05/16 1600  . azithromycin Stopped (11/05/16 2308)  . cefTRIAXone (ROCEPHIN)  IV Stopped (11/05/16 2137)     Assessment/Plan:  CAP Cont Ceftriaxone and azithromycin.  Transition in am to PO levaquin Cbc + diff in am--improving  AECOPD-non O2 dependant stage 2-3 only on albuterol at home.  Needs addition anticholinergic.  Have added Inhaled steroid budesonide as well Continue for today solu-medrol q 12 with plan to transition to po steroids in am  AKI Mild elevation Crreat to 1.3.  basleine is 0.9.  Stop IVF today Note patient was admitted with Ziac which contains HCTZ and is also on hctz.  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  Continue Ziac  Met Prostate Ca with boney mets Cont Oxycodone 10 1-2 bid prn, flexeril 10 daily, gabapentin 300 2 tab tid Cont Terazosin  2 mg hs  Bipolar Cont celexa 40 hs  h/o gout Cont Colchicine 0.6 bid    Full code Transfer to tele Inpatient expect 48 more hours and possible d/c home then OOB  with therapy if prn  Verneita Griffes, MD  [facility] Pager [phone] 11/06/2016, 8:34 AM    LOS: 2 days

## 2016-11-06 NOTE — Progress Notes (Signed)
CPAP set up for patient for the night.  Instructed pt on how to use.  Pt demonstrated and verbalized understanding.

## 2016-11-07 LAB — BASIC METABOLIC PANEL
ANION GAP: 8 (ref 5–15)
BUN: 26 mg/dL — ABNORMAL HIGH (ref 6–20)
CALCIUM: 8.6 mg/dL — AB (ref 8.9–10.3)
CO2: 30 mmol/L (ref 22–32)
Chloride: 100 mmol/L — ABNORMAL LOW (ref 101–111)
Creatinine, Ser: 0.9 mg/dL (ref 0.61–1.24)
GFR calc Af Amer: >60 mL/min (ref >60)
GFR calc non Af Amer: >60 mL/min (ref >60)
GLUCOSE: 123 mg/dL — AB (ref 65–99)
Potassium: 4.2 mmol/L (ref 3.5–5.1)
Sodium: 138 mmol/L (ref 135–145)

## 2016-11-07 LAB — CBC
HEMATOCRIT: 39.7 % (ref 39.0–52.0)
Hemoglobin: 12.7 g/dL — ABNORMAL LOW (ref 13.0–17.0)
MCH: 27.7 pg (ref 26.0–34.0)
MCHC: 32 g/dL (ref 30.0–36.0)
MCV: 86.7 fL (ref 78.0–100.0)
Platelets: 263 10*3/uL (ref 150–400)
RBC: 4.58 MIL/uL (ref 4.22–5.81)
RDW: 14.9 % (ref 11.5–15.5)
WBC: 8.6 10*3/uL (ref 4.0–10.5)

## 2016-11-07 LAB — PROCALCITONIN: Procalcitonin: 0.18 ng/mL

## 2016-11-07 LAB — CULTURE, RESPIRATORY

## 2016-11-07 LAB — CULTURE, RESPIRATORY W GRAM STAIN: Culture: NORMAL

## 2016-11-07 MED ORDER — GUAIFENESIN-DM 100-10 MG/5ML PO SYRP: 5.0000 mL | ORAL_SOLUTION | ORAL | 0 refills | Status: DC | PRN

## 2016-11-07 MED ORDER — LEVOFLOXACIN 500 MG PO TABS: 500.0000 mg | ORAL_TABLET | Freq: Every day | ORAL | 0 refills | Status: DC

## 2016-11-07 MED ORDER — LISINOPRIL 20 MG PO TABS: 20.0000 mg | ORAL_TABLET | Freq: Every day | ORAL | 3 refills | Status: DC

## 2016-11-07 MED ORDER — PREDNISONE 20 MG PO TABS: 40.0000 mg | ORAL_TABLET | Freq: Every day | ORAL | Status: DC

## 2016-11-07 MED ORDER — LEVOFLOXACIN 500 MG PO TABS: 500.0000 mg | ORAL_TABLET | Freq: Every day | ORAL | Status: DC

## 2016-11-07 MED ORDER — PREDNISONE 20 MG PO TABS
40.0000 mg | ORAL_TABLET | Freq: Every day | ORAL | 0 refills | Status: DC
Start: 2016-11-08 — End: 2016-12-16

## 2016-11-07 NOTE — Progress Notes (Signed)
  Patient discharged to home, IV was discontinued, patient education complete including smoking cessation information, patient confirmed he had all belongings, instructed to make follow up appointments, pateint left floor via wheelchair with 02 tank with staff.

## 2016-11-07 NOTE — Progress Notes (Signed)
SATURATION QUALIFICATIONS: (This note is used to comply with regulatory documentation for home oxygen)  Patient Saturations on Room Air at Rest = 93%  Patient Saturations on Room Air while Ambulating = 88%  Patient Saturations on 2 Liters of oxygen while Ambulating = 93%  Please briefly explain why patient needs home oxygen: Patient becomes dyspneic with exertion

## 2016-11-07 NOTE — Care Management Important Message (Signed)
Important Message  Patient Details  Name: Scott Rollins MRN: [id number] Date of Birth: May 03, 1951   Medicare Important Message Given:  Yes    Nathen May 11/07/2016, 2:37 PM

## 2016-11-07 NOTE — Care Management Note (Signed)
Case Management Note  Patient Details  Name: Scott Rollins MRN: [id number] Date of Birth: May 15, 1951  Subjective/Objective:       CM following for progression and d/c planning.             Action/Plan: 11/07/2016 Met with pt re oxygen needs, [facility] notified of orders and plan to d/c to home today.  Noted that pt has been provided with an application for assistance with Symbacort by another CM, this CM provided coupon to assist with Neurotin. This pt has Medicare therefore we are unable to provide a [place] letter. Pt is followed by [facility] and will be assisted with community resources for mediation assistance.   Expected Discharge Date:  11/07/16               Expected Discharge Plan:  Home/Self Care  In-House Referral:   NA  Discharge planning Services  CM Consult  Post Acute Care Choice:  Durable Medical Equipment Choice offered to:  Patient  DME Arranged:  Oxygen DME Agency:  [place]:  NA [place] Agency:  NA  Status of Service:  Completed, signed off  If discussed at [place] of Stay Meetings, dates discussed:    Additional Comments:  Adron Bene, RN 11/07/2016, 3:00 PM

## 2016-11-07 NOTE — Discharge Summary (Signed)
Physician Discharge Summary  AERO DRUMMONDS ACZ:[id number] DOB: Jul 19, 1950 DOA: 11/04/2016  PCP: [name], MD  Admit date: 11/04/2016 Discharge date: 11/07/2016  Time spent: 35 minutes  Recommendations for Outpatient Follow-up:  1. Needs OP completion of levaquin and prednisone-also added Budesonide as a new med on d/c 2. Needs rpt CXR 1 mo 3. Need labs cbc and bmet 1 mo 4. consider up titration of BP meds as OP-have d/w HCTZ as was on ZIac and have also cut back dos eof lisnopril this admit from bid to od  Discharge Diagnoses:  Principal Problem:   COPD with acute exacerbation ([place]) Active Problems:   Essential hypertension   Coronary artery disease due to lipid rich plaque   Prostate cancer ([place])   Chronic pain syndrome   Community acquired pneumonia of right lower lobe of lung ([place])   Hyponatremia   OSA (obstructive sleep apnea)   Discharge Condition: improved on d/c  Diet recommendation: hh low salt  Filed Weights   11/04/16 2233 11/05/16 0148 11/06/16 2328  Weight: 128.4 kg (283 lb) 126.1 kg (278 lb) 128.4 kg (283 lb)    History of present illness:  66 COPD Metastatic prostate cancer with prior history of prostate seed implantation 02/2011 possible bone metastases-on hormonal therapy Marijuana use Moderate diverticulosis on colonoscopy 03/2013 + 6 mm polyp and internal hemorrhoids CAD prior PCI 2004 DES-last 2008 no stenosis last Myoview 2009 without ischemia OSA not on CPAP HTN Chronic pain on opioids  Admitted early 10/2516 shortness fever cough-MAXIMUM TEMPERATURE on admission 11 0.2 respirations 27 sodium 131 CXR right upper lobe and right lower lobe possible pneumonia Started on ceftriaxone and azithromycin and BiPAP and placed on Cardizem for MAT [not afib]  Hospital Course:   CAP initially on Ceftriaxone and azithromycin-->to PO levaquin Cbc + diff in am--improved on d/c completer 5 days more on d/c  AECOPD-non O2 dependant stage 2-3 only  on albuterol at home.  Needs addition anticholinergic.  Have added Inhaled steroid budesonide as well Continue for today solu-medrol q 12 --> Po Prednisone 40 daily on d/c for 5 more days  AKI Mild elevation Crreat to 1.3.  basleine is 0.9.  Stop IVF today Note patient was admitted with Ziac which contains HCTZ and is also on hctz.  Cutting back possibly Lisinopril 20 bid to od later today Might need to increase or add anopther med as an OP per pcp  MAT Now in nsr No recurrence.  Continue Ziac  Met Prostate Ca with boney mets Cont Oxycodone 10 1-2 bid prn, flexeril 10 daily, gabapentin 300 2 tab tid Cont Terazosin  2 mg hs OP pain menagement   Bipolar Cont celexa 40 hs  h/o gout Cont Colchicine 0.6 bid  Procedures:  cxr   Consultations:  none  Discharge Exam: Vitals:   11/06/16 2042 11/07/16 0527  BP: (!) 149/74 130/62  Pulse: 71 75  Resp: 17 17  Temp:  97.6 F (36.4 C)    General: alert pleasant oriented in nad Cardiovascular: s1 s2 no m/r/g Respiratory: clear with mild wheeze  Discharge Instructions   Discharge Instructions    Diet - low sodium heart healthy    Complete by:  As directed    Discharge instructions    Complete by:  As directed    Needs to finish prednisone and levaquin Adding Budesonide inhaler for SOB and might need to add Anticholinergic as well Stop taking some of your blood pressure meds-they were affecting your kidneys Get an  xray in 1 mo Get a follow up visit at primary md in 1 -2 weeks   Increase activity slowly    Complete by:  As directed      Current Discharge Medication List    START taking these medications   Details  guaiFENesin-dextromethorphan (ROBITUSSIN DM) 100-10 MG/5ML syrup Take 5 mLs by mouth every 4 (four) hours as needed for cough. Qty: 118 mL, Refills: 0    levofloxacin (LEVAQUIN) 500 MG tablet Take 1 tablet (500 mg total) by mouth at bedtime. Qty: 5 tablet, Refills: 0    predniSONE (DELTASONE) 20 MG  tablet Take 2 tablets (40 mg total) by mouth daily before breakfast. Qty: 10 tablet, Refills: 0      CONTINUE these medications which have CHANGED   Details  lisinopril (PRINIVIL,ZESTRIL) 20 MG tablet Take 1 tablet (20 mg total) by mouth daily. Qty: 180 tablet, Refills: 3      CONTINUE these medications which have NOT CHANGED   Details  albuterol (PROAIR HFA) 108 (90 Base) MCG/ACT inhaler Inhale 2 puffs into the lungs every 4 (four) hours as needed. Qty: 3 Inhaler, Refills: 1    albuterol (PROVENTIL) (2.5 MG/3ML) 0.083% nebulizer solution USE ONE VIAL IN NEBULIZER EVERY 4 HOURS AS NEEDED Qty: 225 vial, Refills: 0    aspirin 325 MG tablet Take 325 mg by mouth daily.      bisoprolol-hydrochlorothiazide (ZIAC) 5-6.25 MG tablet Take 1 tablet by mouth every morning. Qty: 90 tablet, Refills: 3    citalopram (CELEXA) 40 MG tablet Take 1 tablet (40 mg total) by mouth at bedtime. Qty: 90 tablet, Refills: 3    furosemide (LASIX) 40 MG tablet Take 2 tablets (80 mg total) by mouth daily as needed. Qty: 180 tablet, Refills: 1    gabapentin (NEURONTIN) 300 MG capsule 2 tabs po tid Qty: 540 capsule, Refills: 3    Oxycodone HCl 10 MG TABS 1-2 tabs po bid prn pain Qty: 60 tablet, Refills: 0    Probiotic Product (PROBIOTIC PO) Take 1 capsule by mouth daily.    terazosin (HYTRIN) 2 MG capsule Take 1 capsule (2 mg total) by mouth at bedtime. Qty: 90 capsule, Refills: 3    zolpidem (AMBIEN) 10 MG tablet Take 1 tablet (10 mg total) by mouth at bedtime as needed for sleep. Qty: 90 tablet, Refills: 1   Associated Diagnoses: HTN (hypertension), benign      STOP taking these medications     hydrochlorothiazide (HYDRODIURIL) 25 MG tablet        Allergies  Allergen Reactions  . Amitriptyline Hcl     REACTION: nausea, elevated bp  . Ezetimibe     REACTION: chest pain, fatigue      The results of significant diagnostics from this hospitalization (including imaging, microbiology,  ancillary and laboratory) are listed below for reference.    Significant Diagnostic Studies: Dg Chest Portable 1 View  Result Date: 11/04/2016 CLINICAL DATA:  Chest pain x3 days worsening with dyspnea. Atrial fibrillation. Wheezing. EXAM: PORTABLE CHEST 1 VIEW COMPARISON:  None. FINDINGS: Stable cardiomegaly. No aortic aneurysm. 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 Subtle opacities in the right upper and lower lobes may represent changes of pneumonia. No effusion or pneumothorax. No acute osseous abnormality. IMPRESSION: Stable cardiomegaly. Subtle airspace opacities in right upper and lower lobe raise the possibility of pneumonia. Electronically Signed   By: Ashley Royalty M.D.   On: 11/04/2016 23:02    Microbiology: Recent Results (from the past 240 hour(s))  Blood culture (routine x 2)     Status: None (Preliminary result)   Collection Time: 11/04/16 10:40 PM  Result Value Ref Range Status   Specimen Description BLOOD LEFT HAND  Final   Special Requests   Final    BOTTLES DRAWN AEROBIC AND ANAEROBIC Blood Culture adequate volume   Culture NO GROWTH 1 DAY  Final   Report Status PENDING  Incomplete  Blood culture (routine x 2)     Status: None (Preliminary result)   Collection Time: 11/04/16 10:54 PM  Result Value Ref Range Status   Specimen Description BLOOD RIGHT ARM  Final   Special Requests   Final    BOTTLES DRAWN AEROBIC AND ANAEROBIC Blood Culture adequate volume   Culture NO GROWTH 1 DAY  Final   Report Status PENDING  Incomplete  MRSA PCR Screening     Status: None   Collection Time: 11/05/16  1:47 AM  Result Value Ref Range Status   MRSA by PCR NEGATIVE NEGATIVE Final    Comment:        The GeneXpert MRSA Assay (FDA approved for NASAL specimens only), is one component of a comprehensive MRSA colonization surveillance program. It is not intended to diagnose MRSA infection nor to guide or monitor treatment for MRSA infections.   Culture, sputum-assessment      Status: None   Collection Time: 11/05/16  9:43 AM  Result Value Ref Range Status   Specimen Description SPUTUM  Final   Special Requests NONE  Final   Sputum evaluation THIS SPECIMEN IS ACCEPTABLE FOR SPUTUM CULTURE  Final   Report Status 11/05/2016 FINAL  Final  Culture, respiratory (NON-Expectorated)     Status: None   Collection Time: 11/05/16  9:43 AM  Result Value Ref Range Status   Specimen Description SPUTUM  Final   Special Requests NONE Reflexed from [id number]  Final   Gram Stain   Final    RARE WBC PRESENT, PREDOMINANTLY MONONUCLEAR FEW GRAM POSITIVE COCCI IN PAIRS FEW GRAM POSITIVE RODS RARE GRAM NEGATIVE RODS    Culture Consistent with normal respiratory flora.  Final   Report Status 11/07/2016 FINAL  Final     Labs: Basic Metabolic Panel:  Recent Labs Lab 11/04/16 2253 11/04/16 2302 11/05/16 0609 11/07/16 0505  NA 130* 131* 135 138  K 3.5 3.7 3.8 4.2  CL 90* 93* 95* 100*  CO2 25  --  26 30  GLUCOSE 125* 133* 168* 123*  BUN 21* 28* 21* 26*  CREATININE 1.20 1.30* 1.23 0.90  CALCIUM 8.5*  --  8.6* 8.6*  MG 1.7  --  2.0  --    Liver Function Tests:  Recent Labs Lab 11/04/16 2253 11/05/16 0609  AST 28 32  ALT 16* 19  ALKPHOS 123 122  BILITOT 0.8 0.7  PROT 6.9 6.7  ALBUMIN 3.3* 3.2*   No results for input(s): LIPASE, AMYLASE in the last 168 hours. No results for input(s): AMMONIA in the last 168 hours. CBC:  Recent Labs Lab 11/04/16 2253 11/04/16 2302 11/05/16 0609 11/07/16 0505  WBC 6.9  --  6.5 8.6  NEUTROABS 5.3  --   --   --   HGB 14.2 15.6 15.2 12.7*  HCT 42.9 46.0 46.2 39.7  MCV 85.3  --  86.0 86.7  PLT 213  --  221 263   Cardiac Enzymes: No results for input(s): CKTOTAL, CKMB, CKMBINDEX, TROPONINI in the last 168 hours. BNP: BNP (last 3 results)  Recent Labs  11/04/16  2253  BNP 57.2    ProBNP (last 3 results) No results for input(s): PROBNP in the last 8760 hours.  CBG: No results for input(s): GLUCAP in the last 168  hours.     SignedNita Sells MD   [facility] 11/07/2016, 9:37 AM

## 2016-11-07 NOTE — Consult Note (Signed)
[facility] CM Primary Care Navigator  11/07/2016  Scott Rollins Jun 11, 1951 [id number]   Met with patient at the bedside to identify possible discharge needs. Patient reports having shortness of breath, chest pain and dizziness whichhad led to this admission. Patient endorses Dr. Shawnie Dapper with [place] [facility] as hisprimary care provider.   Patient shared using [facility] in [place] and [facility] in [place] to obtain medications with some affordability issues with his COPD medications/ inhalers (Anoro, Symbicort and Albuterol). Inpatient CM provided patient the [place] phone number and the paper application for assistance with Symbicort.  Hereports managing his own medications at home using "pill box" previously but switched to getting it straight out of the containers to "use brain powers" as stated.  Patient verbalized being able to drive prior to admission, however, his daughter Scott Rollins) or wife (Scott Rollins) canprovide transportation to his doctors'appointments as needed.  Patient had been independent with self care at home prior to admission. He mentioned that daughter lives with him and will be hisprimary caregiverat home.  Discharge plan is home according to patient.  He voiced understanding to call primary care provider's office when he returns home, for a post discharge follow-up appointment within a week or sooner if needs arise.Patient letter (with PCP's contact number) wasprovided as a reminder.  Discussed with patientregarding [facility] CM services available for health management (COPD/PNA)and heopted to receiveEMMI calls rather than visits to help him manage at home. Referral was made for [facility] COPD/PNA calls after discharge.  Patient had verbally agreed for a referral to [facility] for medication assistance with his COPD medications/ inhalers at home.  Referral was made to [facility] as well.    For questions,  please contact:  Dannielle Huh, BSN, RN- [facility] Primary Care Navigator  Telephone: [phone] [place]

## 2016-11-09 ENCOUNTER — Encounter: Payer: Self-pay | Admitting: Family Medicine

## 2016-11-10 ENCOUNTER — Telehealth: Payer: Self-pay

## 2016-11-10 LAB — CULTURE, BLOOD (ROUTINE X 2)
CULTURE: NO GROWTH
Culture: NO GROWTH
SPECIAL REQUESTS: ADEQUATE
Special Requests: ADEQUATE

## 2016-11-10 NOTE — Telephone Encounter (Signed)
Transition Care Management Follow-up Telephone Call   Date discharged? 11/07/2016   How have you been since you were released from the hospital? "I feel better than I did"   Do you understand why you were in the hospital? yes, "Pneumonia and A-fib"   Do you understand the discharge instructions? yes   Where were you discharged to? Home. Lives with wife and daughter.    Items Reviewed:  Medications reviewed: yes  Allergies reviewed: yes  Dietary changes reviewed: yes, low sodium heart healthy.  Referrals reviewed: yes   Functional Questionnaire:   Activities of Daily Living (ADLs):   He states they are independent in the following: ambulation, bathing and hygiene, feeding, continence, grooming, toileting and dressing States they require assistance with the following: grooming and None   Any transportation issues/concerns?: no   Any patient concerns? no   Confirmed importance and date/time of follow-up visits scheduled yes  Provider Appointment booked with PCP on 11/20/16 @ 2pm.  Confirmed with patient if condition begins to worsen call PCP or go to the ER.  Patient was given the office number and encouraged to call back with question or concerns.  : yes

## 2016-11-10 NOTE — Telephone Encounter (Signed)
TCM phone contact noted.

## 2016-11-11 DIAGNOSIS — J189 Pneumonia, unspecified organism: Secondary | ICD-10-CM

## 2016-11-11 HISTORY — DX: Pneumonia, unspecified organism: J18.9

## 2016-11-12 ENCOUNTER — Other Ambulatory Visit: Payer: Self-pay | Admitting: Pharmacist

## 2016-11-12 NOTE — Patient Outreach (Signed)
[facility]) Care Management  11/12/2016  Scott Rollins Nov 07, 1950 [id number]   Patient was called regarding medication assistance and management of COPD medications post recent hospital discharge.  Unfortunately, he did not answer the phone.  HIPAA compliant message left on the patient's voicemail.  Plan:  I will call the patient back in 3-5 business days.   [name], PharmD, [place] Clinical Pharmacist [phone]

## 2016-11-13 ENCOUNTER — Telehealth: Payer: Self-pay | Admitting: Family Medicine

## 2016-11-13 NOTE — Telephone Encounter (Signed)
Pt's urine tox screen showed [facility] as expected. As per our conversation at his most recent O/V, he needs to return for lab visit in 6 wks to submit another urine sample for tox screen in order to see if [facility] is absent.-thx

## 2016-11-13 NOTE — Telephone Encounter (Signed)
Pt advised and voiced understanding.   

## 2016-11-13 NOTE — Telephone Encounter (Signed)
Left message for pt to call back  °

## 2016-11-14 ENCOUNTER — Other Ambulatory Visit: Payer: Self-pay | Admitting: Pharmacist

## 2016-11-17 ENCOUNTER — Ambulatory Visit: Payer: Self-pay | Admitting: Pharmacist

## 2016-11-17 NOTE — Patient Outreach (Signed)
[facility]) Care Management  11/17/2016  Scott Rollins [birth year] [id number]   LATE ENTRY  Patient was called 11/14/16 regarding medication assistance and management. No answer.  HIPAA compliant message left on the patient's voicemail.  Plan:  I will call the patient back within 5-7 business days.  [name], PharmD, [place] Clinical Pharmacist [phone]

## 2016-11-19 ENCOUNTER — Ambulatory Visit: Payer: Self-pay | Admitting: Pharmacist

## 2016-11-20 ENCOUNTER — Ambulatory Visit: Payer: Self-pay | Admitting: Pharmacist

## 2016-11-20 ENCOUNTER — Ambulatory Visit: Payer: Medicare Other | Admitting: Family Medicine

## 2016-11-24 ENCOUNTER — Other Ambulatory Visit: Payer: Self-pay | Admitting: Pharmacist

## 2016-11-24 ENCOUNTER — Encounter: Payer: Self-pay | Admitting: Pharmacist

## 2016-11-24 NOTE — Patient Outreach (Signed)
[place]) Care Management  11/24/2016  Scott Rollins [birth year] [id number]   Patient was called regarding medication assistance and management. No answer.  HIPAA compliant message left on the patient's voicemail.  This was a third outreach attempt.  Plan:  1.  Patient will be sent an Unsuccessful Contact letter  2.  If no response in 10 days, pharmacy case will be close due to inability to contact the patient.  [name], PharmD, [place] Clinical Pharmacist [phone]

## 2016-11-28 ENCOUNTER — Other Ambulatory Visit: Payer: Self-pay | Admitting: Pharmacist

## 2016-11-28 NOTE — Patient Outreach (Addendum)
[place]) Care Management  11/28/2016  Scott Rollins [birth year] [id number]   Patient returned my call but I was unable to answer when he called.  I called him back today and he did not answer. HIPAA compliant message was left on the patient's voicemail.  Plan:  I will call him again next week.   [name], PharmD, BCACP [place] Clinical Pharmacist [phone]  ADDENDUM  Patient called me back during lunch.  I called him back he did not answer.  HIPAA compliant message left on his voicemail.  [name], PharmD, [place] Clinical Pharmacist [phone]

## 2016-12-05 ENCOUNTER — Other Ambulatory Visit: Payer: Self-pay | Admitting: Family Medicine

## 2016-12-06 DIAGNOSIS — R079 Chest pain, unspecified: Secondary | ICD-10-CM | POA: Diagnosis not present

## 2016-12-06 DIAGNOSIS — Z955 Presence of coronary angioplasty implant and graft: Secondary | ICD-10-CM | POA: Diagnosis not present

## 2016-12-06 DIAGNOSIS — R61 Generalized hyperhidrosis: Secondary | ICD-10-CM | POA: Diagnosis not present

## 2016-12-06 DIAGNOSIS — I4891 Unspecified atrial fibrillation: Secondary | ICD-10-CM | POA: Diagnosis not present

## 2016-12-06 DIAGNOSIS — J449 Chronic obstructive pulmonary disease, unspecified: Secondary | ICD-10-CM | POA: Diagnosis not present

## 2016-12-06 DIAGNOSIS — Z9981 Dependence on supplemental oxygen: Secondary | ICD-10-CM | POA: Diagnosis not present

## 2016-12-06 LAB — PROTIME-INR

## 2016-12-09 ENCOUNTER — Encounter: Payer: Self-pay | Admitting: Pharmacist

## 2016-12-09 ENCOUNTER — Other Ambulatory Visit: Payer: Self-pay | Admitting: Pharmacist

## 2016-12-09 ENCOUNTER — Other Ambulatory Visit: Payer: Self-pay | Admitting: *Deleted

## 2016-12-09 NOTE — Telephone Encounter (Signed)
PLEASE NOTE: All timestamps contained within this report are represented as [place] Standard Time. CONFIDENTIALTY NOTICE: This fax transmission is intended only for the addressee. It contains information that is legally privileged, confidential or otherwise protected from use or disclosure. If you are not the intended recipient, you are strictly prohibited from reviewing, disclosing, copying using or disseminating any of this information or taking any action in reliance on or regarding this information. If you have received this fax in error, please notify us immediately by telephone so that we can arrange for its return to [place]. Phone: [phone], Toll-Free: [phone], Fax: [phone] Page: 1 of 3 Call Id: [id number] [place] Night - Client >>>Contains Verbal Order - Signature Required<<< [place] Patient Name: Scott Rollins Gender: Male DOB: May 18, 1951 Age: 66 Y 2 M 10 D Return Phone Number: [phone] (Primary), [phone] (Secondary) City/State/Zip: [place] [address] Client [facility] [place] Night - Client Client Site [place] Night Physician Crissie Sickles - MD Who Is Calling Patient / Member / Family / Caregiver Call Type Triage / Clinical Relationship To Patient Self Return Phone Number [phone] (Primary) Chief Complaint CHEST PAIN (>=21 years) - pain, pressure, heaviness or tightness Reason for Call Symptomatic / Request for [place] states his prescriptions have not been renewed. Caller states he is having chest pains. Nurse Assessment Nurse: Donovan Kail, RN, [facility] Date/Time (Eastern Time): 12/05/2016 6:49:51 PM Please select the assessment type ---Refill Additional Documentation ---Caller states his prescriptions have not been renewed. Caller states he is having chest pains. His meds were not renewed. He is out of town  and needs them sent to a different [facility], Does the patient have enough medication to last until the office opens? ---Unable to obtain loaner dose from Pharmacy Does the client directives allow for assistance with medications after hours? ---Yes Was the medication filled within the last 6 months? ---Yes What is the name of the medication, dose and instructions as listed on the bottle? ---4 meds Name of the physician as listed on the bottle. ---[place] name and phone number where most recently filled. ---[place] Additional Documentation ---Lisinopril 20 mg BID, bisoprolol 6.5/2.5/5 QD, Citalopram 40 mg QD, Terazosin QD Guidelines Guideline Title Affirmed Question Disp. Time Eilene Ghazi Time) Disposition Final User 12/05/2016 7:37:51 PM Clinical Call Yes Donovan Kail, RN, [facility] PLEASE NOTE: All timestamps contained within this report are represented as [place] Standard Time. CONFIDENTIALTY NOTICE: This fax transmission is intended only for the addressee. It contains information that is legally privileged, confidential or otherwise protected from use or disclosure. If you are not the intended recipient, you are strictly prohibited from reviewing, disclosing, copying using or disseminating any of this information or taking any action in reliance on or regarding this information. If you have received this fax in error, please notify us immediately by telephone so that we can arrange for its return to [place]. Phone: [phone], Toll-Free: [phone], Fax: [phone] Page: 2 of 3 Call Id: [id number] Verbal Orders/Maintenance Medications Medication Refill Route Dosage Regime Duration Admin Instructions User Name Lisinopril 20 mg BID Oral 2 Weeks Take 1 pill BID Donovan Kail, RN, Barnetta Chapel Bisoprolol 5/6.25 Yes Oral Take 1 pill QD 2 Weeks Take 1 pill Qd #14. Donovan Kail, RN, [facility] Chapel Citalopram 40 mg Yes Oral Take 1 pill QD 2 Weeks Take 1 pill QD Donovan Kail  RN, Barnetta Chapel Terazosin 2 mg Yes Oral Take 1 pill QD  2 Weeks Take 1 pill Qd # 14. Donovan Kail, RN, Barnetta Chapel Comments User: [place] Becker, RN Date/Time Eilene Ghazi Time): 12/05/2016 7:37:13 PM Pt said he had been seen recently. On call Betty Martinique could not find that in the charts. She authorized 2 week supply but pharmacy would not take her as a Dr for the office. Paging DoctorName Phone DateTime Action Result/Outcome Notes Martinique, Betty - MD [phone] 12/05/2016 6:59:00 PM Doctor Paged Paged On Call Back to Call Center Martinique, Betty - MD 12/05/2016 7:38:34 PM Message Result Spoke with On Call - General Dr Betty Martinique verified the scripts and authorized for 2 weeks to be called in. PLEASE NOTE: All timestamps contained within this report are represented as [place] Standard Time. CONFIDENTIALTY NOTICE: This fax transmission is intended only for the addressee. It contains information that is legally privileged, confidential or otherwise protected from use or disclosure. If you are not the intended recipient, you are strictly prohibited from reviewing, disclosing, copying using or disseminating any of this information or taking any action in reliance on or regarding this information. If you have received this fax in error, please notify us immediately by telephone so that we can arrange for its return to [place]. Phone: [phone], Toll-Free: [phone], Fax: [phone] Page: 3 of 3 Call Id: [phone] [place] >>>Contains Verbal Order - Signature Required<<< [address] [place] [address] [phone] Fax: [phone] [place] Night - Client [place] Night Date: 12/05/2016 From: [facility] Department To: Crissie Sickles - MD Please sign the order for the approved drug(s) given by our call center nurse on your behalf. Fax to [phone] within 5 business  days. Thank you. Date Eilene Ghazi Time): 12/05/2016 6:42:45 PM Triage RN: [place] Becker, RN NAME: VAUGHAN GARFINKLE PHONE NUMBER: [phone] (Primary), [phone] (Secondary) BIRTHDATE: 02-18-1951 ADDRESS: CITY/STATE/ZIP: [place] [address] CALLER: Self NAME: Rx Given Medication Refill Route Dosage Regime Duration Admin Instructions User Name Lisinopril 20 mg BID Oral 2 Weeks Take 1 pill BID Donovan Kail, RN, Barnetta Chapel Bisoprolol 5/6.25 Yes Oral Take 1 pill QD 2 Weeks Take 1 pill Qd #14. Donovan Kail, RN, Barnetta Chapel Citalopram 40 mg Yes Oral Take 1 pill QD 2 Weeks Take 1 pill QD Donovan Kail, RN, Barnetta Chapel Terazosin 2 mg Yes Oral Take 1 pill QD 2 Weeks Take 1 pill Qd # 14. Donovan Kail RN, Barnetta Chapel MD Signature Date

## 2016-12-09 NOTE — Telephone Encounter (Signed)
We send Rx for bisoprolol, citalopram and terazosin to the [facility] on 5/ 2/18. I SW to someone at [facility] to see if Rx's could be transferred. She stated that pt did pick up Rx's called in by nurse on 12/05/16 but that the did not see Rx's from 11/12/16.   I have left message for pt to call back. Need to update his chart with the correct pharmacy.   I will send refills once we know which pharmacy pt uses.

## 2016-12-09 NOTE — Patient Outreach (Signed)
[facility]) Care Management  12/09/2016  SAMMUEL BLICK 1951/02/19 [id number]   Called patient to regarding medication management.  Unfortunately, I have not been able to get in touch with the patient.  A letter was previously mailed to the patient.   Plan:  Pharmacy case will be closed.   [name], PharmD, [place] Clinical Pharmacist [phone]

## 2016-12-10 ENCOUNTER — Other Ambulatory Visit: Payer: Self-pay | Admitting: Pharmacist

## 2016-12-10 MED ORDER — CITALOPRAM HYDROBROMIDE 40 MG PO TABS: 40.0000 mg | ORAL_TABLET | Freq: Every day | ORAL | 1 refills | Status: DC

## 2016-12-10 MED ORDER — TERAZOSIN HCL 2 MG PO CAPS: 2.0000 mg | ORAL_CAPSULE | Freq: Every day | ORAL | 1 refills | Status: DC

## 2016-12-10 MED ORDER — GABAPENTIN 300 MG PO CAPS
ORAL_CAPSULE | ORAL | 3 refills | Status: DC
Start: 2016-12-10 — End: 2016-12-17

## 2016-12-10 MED ORDER — FUROSEMIDE 40 MG PO TABS: 80.0000 mg | ORAL_TABLET | Freq: Every day | ORAL | 1 refills | Status: DC | PRN

## 2016-12-10 MED ORDER — BISOPROLOL-HYDROCHLOROTHIAZIDE 5-6.25 MG PO TABS: 1.0000 | ORAL_TABLET | Freq: Every morning | ORAL | 1 refills | Status: DC

## 2016-12-10 MED ORDER — LISINOPRIL 20 MG PO TABS: 20.0000 mg | ORAL_TABLET | Freq: Every day | ORAL | 1 refills | Status: DC

## 2016-12-10 NOTE — Telephone Encounter (Signed)
SW pt he stated that he uses [facility].  RF request for gabapentin LOV: 10/30/16 Next ov: None Last written: 11/13/15 #540 w/ 3RF  RF request for allopurinol Last written: 11/12/15 #90 w/ 3RF  RF request for cyclobenzaprine  Last written: 11/13/15 #90 w/ 3RF  Please advise. Thanks.   _____________________________  RF request for lisinopril Last written: 11/07/16 #180 w/ 3RF sent as no print  RF request for bisoprolol/hctz Last written: 11/13/15 #90 w/ 3RF  RF request for citalopram Last written: 11/13/15 #90 w/ 3RF  RF request furosemide Last written: 11/13/15 #90 w/ 1RF  RF request for terazosin Last written: 11/13/15 #90 w/ 3RF

## 2016-12-11 ENCOUNTER — Telehealth: Payer: Self-pay | Admitting: Family Medicine

## 2016-12-11 ENCOUNTER — Encounter: Payer: Self-pay | Admitting: Pharmacist

## 2016-12-11 DIAGNOSIS — I480 Paroxysmal atrial fibrillation: Secondary | ICD-10-CM

## 2016-12-11 NOTE — Telephone Encounter (Signed)
Referral to Dr. Rayann Heman ordered as per pt request.

## 2016-12-11 NOTE — Telephone Encounter (Signed)
Patient needs referral with Dr Thompson Grayer with [facility] for AFib.  Please advise.

## 2016-12-11 NOTE — Patient Outreach (Signed)
[facility]) Care Management  [place]   12/11/2016  Scott Rollins 15-Jan-1951 [id number]  Subjective Patient called me back regarding medication assistance per referral. HIPAA identifiers were obtained.  Patient is a 66 year old male with multiple medical conditions including but not limited to: chronic gout, hyperlipidemia, obesity, tobacco abuse,  CAD, hypertension, COPD, OSA, DJD, and history of prostate cancer.  Patient was hospitalized 4//24/18 for community acquired pneumonia.  Patient also reported going to the emergency room while he was at the [facility] and being treated for Afib.  Patient reported managing his medications on his own. He reported he does not have a Medicare Part D plan.  He would like to use Anoro and Symbicort or Breo for his breathing but has not been able to afford the inhalers. (He is not currently using these medications)  Objective:   Encounter Medications: Outpatient Encounter Prescriptions as of 12/10/2016  Medication Sig Note  . albuterol (PROAIR HFA) 108 (90 Base) MCG/ACT inhaler Inhale 2 puffs into the lungs every 4 (four) hours as needed.   Marland Kitchen albuterol (PROVENTIL) (2.5 MG/3ML) 0.083% nebulizer solution USE ONE VIAL IN NEBULIZER EVERY 4 HOURS AS NEEDED   . aspirin 325 MG tablet Take 325 mg by mouth daily.     . bisoprolol-hydrochlorothiazide (ZIAC) 5-6.25 MG tablet Take 1 tablet by mouth every morning.   . citalopram (CELEXA) 40 MG tablet Take 1 tablet (40 mg total) by mouth at bedtime.   . furosemide (LASIX) 40 MG tablet Take 2 tablets (80 mg total) by mouth daily as needed. 12/10/2016: PRN only   . guaiFENesin-dextromethorphan (ROBITUSSIN DM) 100-10 MG/5ML syrup Take 5 mLs by mouth every 4 (four) hours as needed for cough.   Marland Kitchen lisinopril (PRINIVIL,ZESTRIL) 20 MG tablet Take 1 tablet (20 mg total) by mouth daily.   . Oxycodone HCl 10 MG TABS 1-2 tabs po bid prn pain (Patient taking differently: Take 10 mg by mouth 2 (two) times  daily as needed (for pain). )   . Probiotic Product (PROBIOTIC PO) Take 1 capsule by mouth daily.   Marland Kitchen zolpidem (AMBIEN) 10 MG tablet Take 1 tablet (10 mg total) by mouth at bedtime as needed for sleep.   . [DISCONTINUED] gabapentin (NEURONTIN) 300 MG capsule 2 tabs po tid (Patient taking differently: Take 300 mg by mouth 3 (three) times daily as needed (for nerve pain). )   . predniSONE (DELTASONE) 20 MG tablet Take 2 tablets (40 mg total) by mouth daily before breakfast. (Patient not taking: Reported on 12/10/2016)   . terazosin (HYTRIN) 2 MG capsule Take 1 capsule (2 mg total) by mouth at bedtime. (Patient not taking: Reported on 12/10/2016)   . [DISCONTINUED] levofloxacin (LEVAQUIN) 500 MG tablet Take 1 tablet (500 mg total) by mouth at bedtime. (Patient not taking: Reported on 12/10/2016)    No facility-administered encounter medications on file as of 12/10/2016.     Functional Status: In your present state of health, do you have any difficulty performing the following activities: 11/05/2016  Hearing? N  Vision? N  Difficulty concentrating or making decisions? N  Walking or climbing stairs? Y  Dressing or bathing? N  Doing errands, shopping? N  Some recent data might be hidden    Fall/Depression Screening: Fall Risk  11/13/2015  Falls in the past year? No   PHQ 2/9 Scores 11/13/2015  PHQ - 2 Score 3  PHQ- 9 Score 6      Assessment: Patient's medications were reviewed over  the phone.     Drugs sorted by system:  Neurologic/Psychologic: Zolpidem Gabapentin Citalopram  Cardiovascular: Bisoprolol-hydrochlorothiazide Furosemide (only takes PRN) Aspirin Lisinopril   Pulmonary/Allergy: Guiafenesin-dextromethorphan ProAir HFA Prednisone  Gastrointestinal: Probiotic  Pain: Oxycodone  Urinary Terazosin  Vitamins/Minerals: Potassium    Medication Review Finding:  Needs additional therapy-patient has COPD and is only on a rescue inhaler due to cost.  Patient  requested Anoro and Symbicort.  Anoro Ellipta, Breo Ellipta, Ventolin HFA  and Advair are all available from [place] patient assistance program.  Plan Drop off forms to the patient's home for him to take to his next provider's visit. Make sure patient actually has a follow up appointment with his provider.

## 2016-12-12 ENCOUNTER — Encounter: Payer: Self-pay | Admitting: Pharmacist

## 2016-12-12 ENCOUNTER — Other Ambulatory Visit: Payer: Self-pay | Admitting: Pharmacist

## 2016-12-12 DIAGNOSIS — I480 Paroxysmal atrial fibrillation: Secondary | ICD-10-CM

## 2016-12-12 HISTORY — PX: TRANSTHORACIC ECHOCARDIOGRAM: SHX275

## 2016-12-12 HISTORY — DX: Paroxysmal atrial fibrillation: I48.0

## 2016-12-12 NOTE — Patient Outreach (Signed)
[facility]) Care Management  [place]   12/12/2016  Scott Rollins 01/20/1951 [id number]  Subjective: Home visit completed at the patient's home.  HIPAA identifiers were obtained.  Patient is a 66 year old male with multiple medical conditions including but not limited to: chronic gout, hyperlipidemia, obesity, tobacco abuse,  CAD, hypertension, COPD, OSA, DJD, and history of prostate cancer.  Patient was hospitalized 4//24/18 for community acquired pneumonia.  Patient reported going to the emergency room at the [facility] for Afib.    Patient reported his afib was caused by terazosin because he had stopped taking it and then restarted just before his trip.  He was instructed to speak with his provider at the 12/16/16 visit. It was documented on the med review that he was not taking.   Objective:   Encounter Medications: Outpatient Encounter Prescriptions as of 12/12/2016  Medication Sig Note  . albuterol (PROAIR HFA) 108 (90 Base) MCG/ACT inhaler Inhale 2 puffs into the lungs every 4 (four) hours as needed.   Marland Kitchen albuterol (PROVENTIL) (2.5 MG/3ML) 0.083% nebulizer solution USE ONE VIAL IN NEBULIZER EVERY 4 HOURS AS NEEDED   . aspirin 325 MG tablet Take 325 mg by mouth daily.     . bisoprolol-hydrochlorothiazide (ZIAC) 5-6.25 MG tablet Take 1 tablet by mouth every morning.   . citalopram (CELEXA) 40 MG tablet Take 1 tablet (40 mg total) by mouth at bedtime.   . furosemide (LASIX) 40 MG tablet Take 2 tablets (80 mg total) by mouth daily as needed. 12/10/2016: PRN only   . gabapentin (NEURONTIN) 300 MG capsule 2 tabs po tid   . guaiFENesin-dextromethorphan (ROBITUSSIN DM) 100-10 MG/5ML syrup Take 5 mLs by mouth every 4 (four) hours as needed for cough.   Marland Kitchen lisinopril (PRINIVIL,ZESTRIL) 20 MG tablet Take 1 tablet (20 mg total) by mouth daily.   . Oxycodone HCl 10 MG TABS 1-2 tabs po bid prn pain (Patient taking differently: Take 10 mg by mouth 2 (two) times daily as needed  (for pain). )   . predniSONE (DELTASONE) 20 MG tablet Take 2 tablets (40 mg total) by mouth daily before breakfast.   . Probiotic Product (PROBIOTIC PO) Take 1 capsule by mouth daily.   Marland Kitchen zolpidem (AMBIEN) 10 MG tablet Take 1 tablet (10 mg total) by mouth at bedtime as needed for sleep.   Marland Kitchen terazosin (HYTRIN) 2 MG capsule Take 1 capsule (2 mg total) by mouth at bedtime. (Patient not taking: Reported on 12/10/2016)    No facility-administered encounter medications on file as of 12/12/2016.     Functional Status: In your present state of health, do you have any difficulty performing the following activities: 11/05/2016  Hearing? N  Vision? N  Difficulty concentrating or making decisions? N  Walking or climbing stairs? Y  Dressing or bathing? N  Doing errands, shopping? N  Some recent data might be hidden    Fall/Depression Screening: Fall Risk  11/13/2015  Falls in the past year? No   PHQ 2/9 Scores 11/13/2015  PHQ - 2 Score 3  PHQ- 9 Score 6      Assessment:  Patient has COPD and is only on rescue therapy with Albuterol.  Patient said he used Symbicort in the past with success. As such, an application for the [employer] Patient Assistance Program was completed for the patient to obtain Symbicort at no cost.  In addition, an application to [employer]'s patient assistance program was completed to obtain ProAir at no cost.  The patient will take the patient assistance applications to his provider visit on 12/16/16 for signature and to be faxed.  All financial information was sent with the applications.   Plan:  I will follow up with the patient in 2 weeks.  Patient will need to be referred to [facility] to help with signing up for a Medicare Part D plan for 2019   [name], PharmD, [place] Clinical Pharmacist [phone]    Plan:

## 2016-12-16 ENCOUNTER — Ambulatory Visit (INDEPENDENT_AMBULATORY_CARE_PROVIDER_SITE_OTHER): Payer: Medicare Other | Admitting: Family Medicine

## 2016-12-16 ENCOUNTER — Encounter: Payer: Self-pay | Admitting: Family Medicine

## 2016-12-16 VITALS — BP 159/76 | HR 67 | Temp 98.0°F | Resp 16 | Ht 70.5 in | Wt 280.8 lb

## 2016-12-16 DIAGNOSIS — Z8701 Personal history of pneumonia (recurrent): Secondary | ICD-10-CM | POA: Diagnosis not present

## 2016-12-16 DIAGNOSIS — G894 Chronic pain syndrome: Secondary | ICD-10-CM | POA: Diagnosis not present

## 2016-12-16 DIAGNOSIS — J449 Chronic obstructive pulmonary disease, unspecified: Secondary | ICD-10-CM

## 2016-12-16 DIAGNOSIS — I480 Paroxysmal atrial fibrillation: Secondary | ICD-10-CM

## 2016-12-16 DIAGNOSIS — I10 Essential (primary) hypertension: Secondary | ICD-10-CM | POA: Diagnosis not present

## 2016-12-16 DIAGNOSIS — Z79899 Other long term (current) drug therapy: Secondary | ICD-10-CM | POA: Diagnosis not present

## 2016-12-16 DIAGNOSIS — Z79891 Long term (current) use of opiate analgesic: Secondary | ICD-10-CM | POA: Diagnosis not present

## 2016-12-16 LAB — CBC WITH DIFFERENTIAL/PLATELET
Basophils Absolute: 0.1 10*3/uL (ref 0.0–0.1)
Basophils Relative: 1.2 % (ref 0.0–3.0)
EOS PCT: 3.6 % (ref 0.0–5.0)
Eosinophils Absolute: 0.3 10*3/uL (ref 0.0–0.7)
HEMATOCRIT: 40.8 % (ref 39.0–52.0)
HEMOGLOBIN: 13.5 g/dL (ref 13.0–17.0)
LYMPHS PCT: 26.9 % (ref 12.0–46.0)
Lymphs Abs: 2.2 10*3/uL (ref 0.7–4.0)
MCHC: 33 g/dL (ref 30.0–36.0)
MCV: 87.1 fl (ref 78.0–100.0)
MONO ABS: 0.6 10*3/uL (ref 0.1–1.0)
MONOS PCT: 7.3 % (ref 3.0–12.0)
Neutro Abs: 5 10*3/uL (ref 1.4–7.7)
Neutrophils Relative %: 61 % (ref 43.0–77.0)
Platelets: 293 10*3/uL (ref 150.0–400.0)
RBC: 4.68 Mil/uL (ref 4.22–5.81)
RDW: 15.3 % (ref 11.5–15.5)
WBC: 8.3 10*3/uL (ref 4.0–10.5)

## 2016-12-16 LAB — BASIC METABOLIC PANEL
BUN: 18 mg/dL (ref 6–23)
CALCIUM: 9.2 mg/dL (ref 8.4–10.5)
CO2: 33 meq/L — AB (ref 19–32)
CREATININE: 0.94 mg/dL (ref 0.40–1.50)
Chloride: 100 mEq/L (ref 96–112)
GFR: 85.02 mL/min (ref >60.00)
GLUCOSE: 86 mg/dL (ref 70–99)
Potassium: 4.4 mEq/L (ref 3.5–5.1)
Sodium: 141 mEq/L (ref 135–145)

## 2016-12-16 MED ORDER — PREDNISONE 20 MG PO TABS: ORAL_TABLET | ORAL | 0 refills | Status: DC

## 2016-12-16 NOTE — Progress Notes (Signed)
12/16/2016  CC:  Chief Complaint  Patient presents with  . Follow-up    RCI, pt is not fasting.     Patient is a 66 y.o. Caucasian male who presents for  hospital follow up and chronic pain mgmt.  Dates/location hospitalized: [facility]: 4/24-4/27, 2018. Patient was discharged from hospital to home. Reason for admission to hospital: COPD exacerbation complicated by pneumonia in RUL and RLL.  I have reviewed patient's discharge summary plus pertinent specific notes, labs, and imaging from the hospitalization.       Reports a recent ED visit to a hospital in [place]-- says he felt rapid heart rate, nausea, diaphoresis, and CP.  He called EMS and it is difficult to discern from pt history today whether or not he was actually in atrial fibrillation or not.  He was transported to the ED, where he says it was recommended he be admitted to hospital but pt declined--apparently went home AMA.  He later called our office and requested a referral to Dr. Rayann Heman in [facility] electrophysiology for dx of a-fib. Patient attributes his symptoms possibly to terazosin b/c he had just recently restarted this med just prior to the episode.  He has since stopped taking terazosin.  Medication reconciliation was done today and patient is taking meds as recommended by discharging hospitalist/specialist, except he recently increased his lisinopril back to 20mg  bid dosing. He has been using his oxygen with sleep/CPAP. Feels very tired, gets out of breath very easily.  Still congested and wheezing/coughing. Says he has been feeling this way essentially since d/c home from [facility] about 6 wks ago. Denies palpitations, denies chest pain. Home bp's normal per pt.  PAIN MGMT: Indication for chronic opioid: diffuse osteoarthritis and possibly bone mets from his prostate cancer. Medication and dose: oxycodone 10mg , 1 bid prn. # pills per month: 60 Last UDS date: 10/30/16 Pain contract signed (Y/N): Yes Date narcotic  database last reviewed (include red flags): 10/30/16.  Of note, due to pt smoking marijuana, he needs to have a UDS today that does not show THC.  Otherwise I won't continue to rx controlled substances for him any longer. I did fill his oxycodone rx at last visit on 10/30/16. He took oxycodone last night and today.  Also on cyclobenzaprine lately.  Says he quit smoking marijuana 1 week ago.  He understands I won't rx him any further narcotics (except for a weening rx) if his UDS today is + for THC.  PMH:  Past Medical History:  Diagnosis Date  . Anxiety   . 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 COPD (chronic obstructive pulmonary disease) ([place])    "pearl study" pt thinks he is on Symbicort; pt noncompliant with COPD controller meds on/off due to financial reasons.  . Coronary atherosclerosis of unspecified type of vessel, native or graft 2005   MI, s/p PCI with stent (pt reports 20+ interventions in the past, most recent cat 6/08 showed patent stents).  Normal LV function.  Nuclear stress test NEG 8/09.  . Depression    ?bipolar dx by psychiatrist?  . Diverticulosis 2006  . ED (erectile dysfunction)   . Gouty arthritis    Dr. Amil Amen  . Hematochezia 09/2012   Hyperplastic polyp, diverticulosis, and internal hemorrhoids found on colonoscopy 10/2012  . Hyperlipidemia    Hx of multiple statin intolerance  . Hypertension   . 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 Myocardial infarction ([place])  x 2 or 3  . Neuropathic pain of both feet 2015/2016   Vit B12 and A1c normal 10/2014  . Obesities, morbid ([place])   . OSA on CPAP    trying to get back on CPAP as of 07/2013  . Osteoarthrosis, unspecified whether generalized or localized, unspecified site    DDD  . Prediabetes 2016/17  . Prostate cancer ([place]) 09/2010   Localized, high risk disease (Dr. Kimbrough/Grapey):  s/p 5 wks ext bm rad + seed boost, as well as hormone blockade x 1 yr.   Biochemical recurrence 11/2015--urologist did bone scan and spots on T spine and L iliac region came up, pt needs plain films of pelvis/T spine to see if DJD correlate present.  He'll eventually need to resume androgen depriv therapy.  . Radiation    Pelvic--for prostate ca: 25 treatments/01/2011  . Seizure disorder ([place]) 08-15-2011   Deja vu sensations: no w/u.  These stopped when neurontin 300mg  tid was started for a different reason.  . Status post chemotherapy    10/2010 thru 06/2011    PSH:  Past Surgical History:  Procedure Laterality Date  . CARDIAC CATHETERIZATION  23 caths   pt states he has 6 stents  . COLONOSCOPY WITH PROPOFOL N/A 04/05/2013   Dr. Fuller Plan.  Polypectomy (hyperplastic--recall 10 yrs).  Moderate diverticulosis, +internal hemorrhoids.  No radiation proctitis.  Marland Kitchen HERNIA REPAIR  yrs ago   [employer]  . HOT HEMOSTASIS N/A 04/05/2013   Procedure: HOT HEMOSTASIS (ARGON PLASMA COAGULATION/BICAP);  Surgeon: Ladene Artist, MD;  Location: [facility];  Service: Endoscopy;  Laterality: N/A;  . seed implants     02/24/2011/ in prostate    MEDS:  Outpatient Medications Prior to Visit  Medication Sig Dispense Refill  . albuterol (PROAIR HFA) 108 (90 Base) MCG/ACT inhaler Inhale 2 puffs into the lungs every 4 (four) hours as needed. 3 Inhaler 1  . albuterol (PROVENTIL) (2.5 MG/3ML) 0.083% nebulizer solution USE ONE VIAL IN NEBULIZER EVERY 4 HOURS AS NEEDED 225 vial 0  . aspirin 325 MG tablet Take 325 mg by mouth daily.      . bisoprolol-hydrochlorothiazide (ZIAC) 5-6.25 MG tablet Take 1 tablet by mouth every morning. 90 tablet 1  . citalopram (CELEXA) 40 MG tablet Take 1 tablet (40 mg total) by mouth at bedtime. 90 tablet 1  . furosemide (LASIX) 40 MG tablet Take 2 tablets (80 mg total) by mouth daily as needed. 180 tablet 1  . gabapentin (NEURONTIN) 300 MG capsule 2 tabs po tid 540 capsule 3  . guaiFENesin-dextromethorphan (ROBITUSSIN DM) 100-10 MG/5ML syrup Take 5 mLs by mouth  every 4 (four) hours as needed for cough. 118 mL 0  . lisinopril (PRINIVIL,ZESTRIL) 20 MG tablet Take 1 tablet (20 mg total) by mouth daily. 180 tablet 1  . Oxycodone HCl 10 MG TABS 1-2 tabs po bid prn pain (Patient taking differently: Take 10 mg by mouth 2 (two) times daily as needed (for pain). ) 60 tablet 0  . Probiotic Product (PROBIOTIC PO) Take 1 capsule by mouth daily.    Marland Kitchen zolpidem (AMBIEN) 10 MG tablet Take 1 tablet (10 mg total) by mouth at bedtime as needed for sleep. 90 tablet 1  . predniSONE (DELTASONE) 20 MG tablet Take 2 tablets (40 mg total) by mouth daily before breakfast. (Patient not taking: Reported on 12/16/2016) 10 tablet 0  . terazosin (HYTRIN) 2 MG capsule Take 1 capsule (2 mg total) by mouth at bedtime. (Patient not taking: Reported on 12/10/2016)  90 capsule 1   No facility-administered medications prior to visit.    EXAM: BP (!) 159/76 (BP Location: Left Arm, Patient Position: Sitting, Cuff Size: Normal)   Pulse 67   Temp 98 F (36.7 C) (Oral)   Resp 16   Ht 5' 10.5" (1.791 m)   Wt 280 lb 12 oz (127.3 kg)   SpO2 94%   BMI 39.71 kg/m  Gen: Alert, well appearing.  Patient is oriented to person, place, time, and situation. AFFECT: pleasant, lucid thought and speech. CV: RRR, no m/r/g.   LUNGS: CTA bilat, nonlabored resps, moderately diminished aeration in all lung fields. EXT: no clubbing or cyanosis.  He has trace R LL pitting edema and 1+ L LL pitting edema.  Pertinent labs/imaging   Chemistry      Component Value Date/Time   NA 138 11/07/2016 0505   K 4.2 11/07/2016 0505   CL 100 (L) 11/07/2016 0505   CO2 30 11/07/2016 0505   BUN 26 (H) 11/07/2016 0505   CREATININE 0.90 11/07/2016 0505   CREATININE 0.86 07/22/2012 1736      Component Value Date/Time   CALCIUM 8.6 (L) 11/07/2016 0505   ALKPHOS 122 11/05/2016 0609   AST 32 11/05/2016 0609   ALT 19 11/05/2016 0609   BILITOT 0.7 11/05/2016 0609     Lab Results  Component Value Date   WBC 8.6  11/07/2016   HGB 12.7 (L) 11/07/2016   HCT 39.7 11/07/2016   MCV 86.7 11/07/2016   PLT 263 11/07/2016   Lab Results  Component Value Date   HGBA1C 6.3 10/31/2016   ASSESSMENT/PLAN:  1) Hospital f/u: exacerbation of severe COPD, RUL and RLL pneumonia. Still struggling with poorly controlled COPD.  I restarted him on prednisone taper today: 40mg  qd x 7d, then 20mg  qd x 7d, then 10mg  qd x 8d. Med assistance paperwork filled out today so he can get proAir. Repeat CXR, CBC, and BMET. Pt requested referral to pulmonary rehab so I ordered this today.  2) Chronic pain syndrome/med mgmt. UDS today.  If THC positive then I'll rx one last rx for oxycodone in order for him to ween off of this med. After that, he understands that I will never rx narcotics/controlled substances for him again.  3) Question of PAF: no records for review at this time [facility]).  Pt's history not entirely clear. Regular rhythm today.  Has appt for further evaluation with Dr. Rayann Heman.  Spent 40 min with pt today, with >50% of this time spent in counseling and care coordination regarding the above problems.  Reviewed hosp records with pt.  Discussed severe COPD sx's, need for chronic controller med + close f/u.  Financial constraints are hindering preventative med rx, but proAir will be obtained as stated above.  Discussed his pain situation and the difficulties surrounding rx of narcotics in the setting of a patient using illicit substances.  He expressed understanding.    An After Visit Summary was printed and given to the patient.  FOLLOW UP:  6 weeks.  Signed:  Crissie Sickles, MD           12/16/2016

## 2016-12-17 ENCOUNTER — Other Ambulatory Visit: Payer: Self-pay

## 2016-12-17 DIAGNOSIS — I10 Essential (primary) hypertension: Secondary | ICD-10-CM

## 2016-12-17 MED ORDER — CYCLOBENZAPRINE HCL 10 MG PO TABS: 10.0000 mg | ORAL_TABLET | Freq: Every day | ORAL | 5 refills | Status: DC

## 2016-12-17 MED ORDER — ALLOPURINOL 300 MG PO TABS: 300.0000 mg | ORAL_TABLET | Freq: Every day | ORAL | 3 refills | Status: DC

## 2016-12-17 MED ORDER — BISOPROLOL-HYDROCHLOROTHIAZIDE 5-6.25 MG PO TABS: 1.0000 | ORAL_TABLET | Freq: Every morning | ORAL | 3 refills | Status: DC

## 2016-12-17 MED ORDER — CITALOPRAM HYDROBROMIDE 40 MG PO TABS: 40.0000 mg | ORAL_TABLET | Freq: Every day | ORAL | 3 refills | Status: DC

## 2016-12-17 MED ORDER — LISINOPRIL 20 MG PO TABS: ORAL_TABLET | ORAL | 3 refills | Status: DC

## 2016-12-17 MED ORDER — GABAPENTIN 300 MG PO CAPS: ORAL_CAPSULE | ORAL | 3 refills | Status: DC

## 2016-12-17 MED ORDER — ZOLPIDEM TARTRATE 10 MG PO TABS: 10.0000 mg | ORAL_TABLET | Freq: Every evening | ORAL | 1 refills | Status: DC | PRN

## 2016-12-17 NOTE — Telephone Encounter (Signed)
Patient requesting refills on these medication. 90 day supply with refills.

## 2016-12-22 ENCOUNTER — Ambulatory Visit (HOSPITAL_BASED_OUTPATIENT_CLINIC_OR_DEPARTMENT_OTHER)
Admission: RE | Admit: 2016-12-22 | Discharge: 2016-12-22 | Disposition: A | Discharge status: Home / Self Care | Payer: Medicare Other | Source: Ambulatory Visit | Attending: Urology | Admitting: Urology

## 2016-12-22 ENCOUNTER — Other Ambulatory Visit (HOSPITAL_COMMUNITY): Payer: Self-pay | Admitting: Urology

## 2016-12-22 ENCOUNTER — Ambulatory Visit (INDEPENDENT_AMBULATORY_CARE_PROVIDER_SITE_OTHER): Payer: Medicare Other | Admitting: Internal Medicine

## 2016-12-22 ENCOUNTER — Encounter: Payer: Self-pay | Admitting: Internal Medicine

## 2016-12-22 VITALS — BP 176/96 | HR 80 | Ht 70.0 in | Wt 283.6 lb

## 2016-12-22 DIAGNOSIS — M2578 Osteophyte, vertebrae: Secondary | ICD-10-CM | POA: Insufficient documentation

## 2016-12-22 DIAGNOSIS — J449 Chronic obstructive pulmonary disease, unspecified: Secondary | ICD-10-CM | POA: Diagnosis not present

## 2016-12-22 DIAGNOSIS — R102 Pelvic and perineal pain: Secondary | ICD-10-CM | POA: Diagnosis not present

## 2016-12-22 DIAGNOSIS — G4733 Obstructive sleep apnea (adult) (pediatric): Secondary | ICD-10-CM | POA: Diagnosis not present

## 2016-12-22 DIAGNOSIS — C61 Malignant neoplasm of prostate: Secondary | ICD-10-CM

## 2016-12-22 DIAGNOSIS — I471 Supraventricular tachycardia: Secondary | ICD-10-CM

## 2016-12-22 DIAGNOSIS — I10 Essential (primary) hypertension: Secondary | ICD-10-CM

## 2016-12-22 DIAGNOSIS — M546 Pain in thoracic spine: Secondary | ICD-10-CM | POA: Diagnosis not present

## 2016-12-22 DIAGNOSIS — I480 Paroxysmal atrial fibrillation: Secondary | ICD-10-CM | POA: Diagnosis not present

## 2016-12-22 DIAGNOSIS — M4184 Other forms of scoliosis, thoracic region: Secondary | ICD-10-CM | POA: Insufficient documentation

## 2016-12-22 DIAGNOSIS — M47814 Spondylosis without myelopathy or radiculopathy, thoracic region: Secondary | ICD-10-CM | POA: Diagnosis not present

## 2016-12-22 DIAGNOSIS — I517 Cardiomegaly: Secondary | ICD-10-CM | POA: Diagnosis not present

## 2016-12-22 DIAGNOSIS — J189 Pneumonia, unspecified organism: Secondary | ICD-10-CM | POA: Diagnosis not present

## 2016-12-22 DIAGNOSIS — Z8701 Personal history of pneumonia (recurrent): Secondary | ICD-10-CM | POA: Diagnosis not present

## 2016-12-22 NOTE — Progress Notes (Signed)
Electrophysiology Office Note   Date:  12/22/2016   ID:  [name], [name] [birth year], MRN [id number]  PCP:  [name], MD  Cardiologist:  [name] (has not seen since 2012, previously has seen Dr Tamala Julian with [facility] also) Primary Electrophysiologist: Thompson Grayer, MD    Chief Complaint  Patient presents with  . Atrial Fibrillation      History of Present Illness: Scott Rollins is a 66 y.o. male who presents today for electrophysiology evaluation.   The patient is referred by Dr Anitra Lauth for consultation regarding therapeutic strategies for atrial arrhythmias.  He has a h/o CAD and is s/p prior PCIs.  He also has severe COPD and OSA.  He continues to smoke.  He has not seen cardiology since 2012.  In April, he presented to [facility] with pneumonia and was also noted to have multifocal atrial tachycardia.  This improved with treatment of his underlying lung issues.  More recently, he presented to [facility] 12/06/16 and was found to have afib.  He had not taken his bisoprolol.  He received IV metoprolol and was restarted on his oral bisoprolol.  He has done well since without any further atrial arrhythmias.   He is not very active.  He reports significant R hip pain due to prostate CA.  He smokes marijuana.  He has been previously noncompliant with CPAP but states that he has recently started using it again.  He smokes tobacco also.  He has not seen cardiology since 2012.  He is chronically SOB.  Today, he denies symptoms of palpitations, chest pain, orthopnea, PND, lower extremity edema, claudication, dizziness, presyncope, syncope, bleeding, or neurologic sequela. The patient is tolerating medications without difficulties and is otherwise without complaint today.    Past Medical History:  Diagnosis Date  . Anxiety   . 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 COPD (chronic obstructive pulmonary disease) ([place])    "pearl  study" pt thinks he is on Symbicort; pt noncompliant with COPD controller meds on/off due to financial reasons.  . Coronary atherosclerosis of unspecified type of vessel, native or graft 2005   MI, s/p PCI with stent (pt reports 20+ interventions in the past, most recent cat 6/08 showed patent stents).  Normal LV function.  Nuclear stress test NEG 8/09.  . Depression    ?bipolar dx by psychiatrist?  . Diverticulosis 2006  . ED (erectile dysfunction)   . Gouty arthritis    Dr. Amil Amen  . Hematochezia 09/2012   Hyperplastic polyp, diverticulosis, and internal hemorrhoids found on colonoscopy 10/2012  . Hyperlipidemia    Hx of multiple statin intolerance  . Hypertension   . 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 Myocardial infarction ([place])    x 2 or 3  . Neuropathic pain of both feet 2015/2016   Vit B12 and A1c normal 10/2014  . Obesities, morbid ([place])   . OSA on CPAP   . Osteoarthrosis, unspecified whether generalized or localized, unspecified site    DDD  . Prediabetes 2016/17  . Prostate cancer ([place]) 09/2010   Localized, high risk disease (Dr. Kimbrough/Grapey):  s/p 5 wks ext bm rad + seed boost, as well as hormone blockade x 1 yr.  Biochemical recurrence 11/2015--urologist did bone scan and spots on T spine and L iliac region came up, pt needs plain films of pelvis/T spine to see if DJD correlate present.  He'll eventually need  to resume androgen depriv therapy.  . Radiation    Pelvic--for prostate ca: 25 treatments/01/2011  . Seizure disorder ([place]) 08-15-2011   Deja vu sensations: no w/u.  These stopped when neurontin 300mg  tid was started for a different reason.  . Status post chemotherapy    10/2010 thru 06/2011   Past Surgical History:  Procedure Laterality Date  . CARDIAC CATHETERIZATION  23 caths   pt states he has 6 stents  . COLONOSCOPY WITH PROPOFOL N/A 04/05/2013   Dr. Fuller Plan.  Polypectomy (hyperplastic--recall 10 yrs).  Moderate diverticulosis, +internal  hemorrhoids.  No radiation proctitis.  Marland Kitchen HERNIA REPAIR  yrs ago   [employer]  . HOT HEMOSTASIS N/A 04/05/2013   Procedure: HOT HEMOSTASIS (ARGON PLASMA COAGULATION/BICAP);  Surgeon: Ladene Artist, MD;  Location: [facility];  Service: Endoscopy;  Laterality: N/A;  . seed implants     02/24/2011/ in prostate     Current Outpatient Prescriptions  Medication Sig Dispense Refill  . acetaminophen (TYLENOL) 500 MG tablet Take 500 mg by mouth 2 (two) times daily.    Marland Kitchen albuterol (PROVENTIL HFA;VENTOLIN HFA) 108 (90 Base) MCG/ACT inhaler Inhale 2 puffs into the lungs every 4 (four) hours as needed for wheezing or shortness of breath.    Marland Kitchen albuterol (PROVENTIL) (2.5 MG/3ML) 0.083% nebulizer solution USE ONE VIAL IN NEBULIZER EVERY 4 HOURS AS NEEDED FOR SHORTNESS OF BREATH OR WHEEZING    . allopurinol (ZYLOPRIM) 300 MG tablet Take 1 tablet (300 mg total) by mouth daily. 90 tablet 3  . aspirin 325 MG tablet Take 325 mg by mouth daily.      Marland Kitchen aspirin EC 81 MG tablet Take 162 mg by mouth daily as needed (pain).    . bisoprolol-hydrochlorothiazide (ZIAC) 5-6.25 MG tablet Take 1 tablet by mouth every morning. 90 tablet 3  . citalopram (CELEXA) 40 MG tablet Take 1 tablet (40 mg total) by mouth at bedtime. 90 tablet 3  . cyclobenzaprine (FLEXERIL) 10 MG tablet Take 1 tablet (10 mg total) by mouth at bedtime. 30 tablet 5  . furosemide (LASIX) 40 MG tablet Take 2 tablets (80 mg total) by mouth daily as needed. 180 tablet 1  . gabapentin (NEURONTIN) 300 MG capsule Take 300 mg by mouth 3 (three) times daily.     Marland Kitchen guaiFENesin-dextromethorphan (ROBITUSSIN DM) 100-10 MG/5ML syrup Take 5 mLs by mouth every 4 (four) hours as needed for cough. 118 mL 0  . lisinopril (PRINIVIL,ZESTRIL) 20 MG tablet Take 20 mg by mouth daily.    . naproxen (NAPROSYN) 500 MG tablet Take 500 mg by mouth 2 (two) times daily with a meal.    . Oxycodone HCl 10 MG TABS Take 10-20 mg by mouth 2 (two) times daily as needed (Pain).    .  predniSONE (DELTASONE) 20 MG tablet 2 tabs po qd x 7d, then 1 tab po qd x 7d, then 1/2 tab po qd x 8d 25 tablet 0  . Probiotic Product (PROBIOTIC PO) Take 1 capsule by mouth daily.    Marland Kitchen zolpidem (AMBIEN) 10 MG tablet Take 1 tablet (10 mg total) by mouth at bedtime as needed for sleep. 90 tablet 1   No current facility-administered medications for this visit.     Allergies:   Amitriptyline hcl and Ezetimibe   Social History:  The patient  reports that he has been smoking Cigarettes.  He has a 46.00 pack-year smoking history. He has never used smokeless tobacco. He reports that he drinks alcohol. He reports that  he uses drugs, including Marijuana.   Family History:  The patient's  family history includes Arthritis in his mother; Bipolar disorder in his father; Heart disease in his father.    ROS:  Please see the history of present illness.   All other systems are personally reviewed and negative.    PHYSICAL EXAM: VS:  BP (!) 176/96   Pulse 80   Ht 5\' 10"  (1.778 m)   Wt 283 lb 9.6 oz (128.6 kg)   SpO2 93%   BMI 40.69 kg/m  , BMI Body mass index is 40.69 kg/m. GEN: obese, in no acute distress  HEENT: normal  Neck: no JVD, carotid bruits, or masses Cardiac: RRR; no murmurs, rubs, or gallops,  Decreased DP/PT pulses,no edema  Respiratory:  clear to auscultation bilaterally, normal work of breathing GI: soft, nontender, nondistended, + BS MS: + atrophy of both legs  Skin: warm and dry  Neuro:  Strength and sensation are intact Psych: euthymic mood, full affect  EKG:  EKG is ordered today. The ekg ordered today is personally reviewed and shows sinus rhythm 80 bpm, otherwise normal ekg   Recent Labs: 11/04/2016: B Natriuretic Peptide 57.2 11/05/2016: ALT 19; Magnesium 2.0; TSH 0.444 12/16/2016: BUN 18; Creatinine, Ser 0.94; Hemoglobin 13.5; Platelets 293.0; Potassium 4.4; Sodium 141  personally reviewed   Lipid Panel     Component Value Date/Time   CHOL 211 (H) 10/31/2016 0934     TRIG 137.0 10/31/2016 0934   HDL 37.30 (L) 10/31/2016 0934   CHOLHDL 6 10/31/2016 0934   VLDL 27.4 10/31/2016 0934   LDLCALC 146 (H) 10/31/2016 0934   LDLDIRECT 130.7 12/02/2012 0916   personally reviewed   Wt Readings from Last 3 Encounters:  12/22/16 283 lb 9.6 oz (128.6 kg)  12/16/16 280 lb 12 oz (127.3 kg)  11/06/16 283 lb (128.4 kg)      Other studies personally reviewed: Additional studies/ records that were reviewed today include: records from recent [facility] hospitalization, notes from [facility]  Review of the above records today demonstrates: as above   ASSESSMENT AND PLAN:  1.  Atrial arrhythmias [facility], recently felt to have MAT.  I have reviewed ekgs from [facility] which also reveal primarily multifocal atrial tachycardia as well as nonsustained atrial arrhythmias.  I do not see clear cut sustained afib.  Certainly, he has multiple risk factors for atrial arrhythmias including obesity, lung disease, OSA, uncontrolled hypertension, steroid use, inactivity, etc.  We had a long discussion about the importance of lifestyle modification today including weight loss, medical compliance, CPAP compliance, and avoidance of marijuana/ tobacco.  I have advised that he appears to be "on a runaway train" from a CV standpoint.  He agrees and says that he will be more diligent.   Given noncompliance with follow-up, I would not advise anticoagulation unless we are very clear that he has sustained afib.  He is a poor candidate for antiarrhythmic medicines and also for ablation. I have made no changes today Echo to evaluate for structural heart disease.  2. CAD Known CAD Has not seen cardiology since 2012.  No changes today Echo He needs to re-establish with general cardiology  3. Morbid obesity Body mass index is 40.69 kg/m. Lifestyle modification as above  4. Hypertensive cardiovascular disease Elevated BP Will need close follow-up with PCP  and also [name]  5. OSA Compliance with CPAP encouraged  6. Tobacco/ marijuana Cessation advised As marijuana is known to cause atrial arrhythmias,  I am hopefully that cessation will improve atrial arrhythmia burden   Follow-up with [name]. I will see as needed going forward  Current medicines are reviewed at length with the patient today.   The patient does not have concerns regarding his medicines.  The following changes were made today:  none   Signed, Thompson Grayer, MD  12/22/2016 3:21 PM     [address] [place] [address] [phone] (office) [phone] (fax)

## 2016-12-22 NOTE — Patient Instructions (Addendum)
Medication Instructions:  Your physician recommends that you continue on your current medications as directed. Please refer to the Current Medication list given to you today.   Labwork: None ordered   Testing/Procedures: Your physician has requested that you have an echocardiogram. Echocardiography is a painless test that uses sound waves to create images of your heart. It provides your doctor with information about the size and shape of your heart and how well your heart's chambers and valves are working. This procedure takes approximately one hour. There are no restrictions for this procedure.    Follow-Up: Your physician recommends that you schedule a follow-up appointment with [name] (re-establish) last seen 2012.  See [name] as needed   Any Other Special Instructions Will Be Listed Below (If Applicable).     If you need a refill on your cardiac medications before your next appointment, please call your pharmacy.

## 2016-12-23 ENCOUNTER — Telehealth: Payer: Self-pay | Admitting: Family Medicine

## 2016-12-23 MED ORDER — OXYCODONE HCL 10 MG PO TABS: ORAL_TABLET | ORAL | 0 refills | Status: DC

## 2016-12-23 NOTE — Telephone Encounter (Signed)
Pls notify pt that his urine toxicology screen showed marijuana as we expected. I will write him one last oxycodone prescription to ween him down slowly off this medication. We have already discussed this situation, and I can no longer prescribe him any controlled substances.--thx

## 2016-12-23 NOTE — Telephone Encounter (Signed)
Left message for pt to call back  °

## 2016-12-23 NOTE — Telephone Encounter (Signed)
Patient notified and verbalized understanding. 

## 2016-12-24 ENCOUNTER — Other Ambulatory Visit: Payer: Self-pay | Admitting: Pharmacist

## 2016-12-24 NOTE — Patient Outreach (Addendum)
[facility]) Care Management  12/24/2016  Scott Rollins Nov 28, 1950 [id number]   Patient called to check on the status of his medication assistance applications while I was in a home visit and left a message.    [employer] Patient Assistance Program was called.  Patient has been approved through 12/19/17 for ProAir.  A 44-month supply of ProAir should be delivered to the patient's home within 3-5 business days.  [employer] ([employer]) was called. The representative said they did not have an application on file for the patient.  Patient was called back to update him on the status.  No answer. A HIPAA compliant message was left on his voicemail.  Plan:  I will await a call back from the patient or follow up with him at his original follow up appointment 12/29/17.  [name], PharmD, [place] Clinical Pharmacist [phone]   ADDENDUM  Patient called me back. HIPAA identifiers were obtained. Patient was informed he was approved with [employer] and that [facility] did not have an application for him.  Patient was also informed the physician note did not mention Symbicort.  Patient said he would call the provider and let me know.  [name], PharmD, [place] Clinical Pharmacist [phone]

## 2016-12-26 ENCOUNTER — Ambulatory Visit: Payer: Medicare Other | Admitting: Pharmacist

## 2016-12-29 ENCOUNTER — Ambulatory Visit: Payer: Medicare Other | Admitting: Pharmacist

## 2016-12-31 ENCOUNTER — Other Ambulatory Visit: Payer: Self-pay | Admitting: Family Medicine

## 2017-01-01 ENCOUNTER — Other Ambulatory Visit: Payer: Self-pay | Admitting: Pharmacist

## 2017-01-01 ENCOUNTER — Other Ambulatory Visit: Payer: Self-pay | Admitting: Family Medicine

## 2017-01-01 MED ORDER — BUDESONIDE-FORMOTEROL FUMARATE 160-4.5 MCG/ACT IN AERO: 2.0000 | INHALATION_SPRAY | Freq: Two times a day (BID) | RESPIRATORY_TRACT | 12 refills | Status: DC

## 2017-01-01 NOTE — Patient Outreach (Addendum)
[facility] [facility]) Care Management  01/01/2017  Scott Rollins [birth year] [id number]  Patient was called to follow up on medication assistance. Unfortunately, he did not answer. HIPAA compliant message left on his voicemail.  [place] was called again as they did not have an application on file when I called last week.  The patient was given an application for [name]) and [name].  He was approved for [employer]'s program but [name] stated they did not have an application for the patient.  Dr. Rulon Sera most recent office visit note reflected an application for ProAir but did not mention Symbicort.  When I spoke with the patient last week, he said he was going to follow up with the provider's office.  Plan:  1. Follow up with the patient in 7-10 business days.  (He should have received ProAir in the mail)    [name], PharmD, [place] Clinical Pharmacist [phone]   ADDENDUM  Patient called me back. HIPAA identifiers were obtained.He confirmed he received ProAir inhalers in the mail.  He also said he spoke with Nira Conn at Dr. Rulon Sera office who told him she would refax the application to [name] last week.   Called Dr. Rulon Sera office and spoke with Nira Conn.  They had been sending the form to the wrong fax number.  She was given the correct fax number. In addition, Nira Conn noted the name of the drug and directions was also not completed.  She said she would give the forms back to Dr. Ernestine Conrad and have the forms resent to the correct number.  Called patient back to inform him but he did not answer the phone.  HIPAA compliant message left on the patient's voicemail.  [name], PharmD, [place] Clinical Pharmacist [phone]

## 2017-01-06 ENCOUNTER — Ambulatory Visit (HOSPITAL_COMMUNITY): Payer: Medicare Other | Attending: Cardiology

## 2017-01-06 ENCOUNTER — Other Ambulatory Visit: Payer: Self-pay

## 2017-01-06 DIAGNOSIS — E785 Hyperlipidemia, unspecified: Secondary | ICD-10-CM | POA: Diagnosis not present

## 2017-01-06 DIAGNOSIS — I10 Essential (primary) hypertension: Secondary | ICD-10-CM | POA: Diagnosis not present

## 2017-01-06 DIAGNOSIS — J449 Chronic obstructive pulmonary disease, unspecified: Secondary | ICD-10-CM | POA: Insufficient documentation

## 2017-01-06 DIAGNOSIS — I219 Acute myocardial infarction, unspecified: Secondary | ICD-10-CM | POA: Insufficient documentation

## 2017-01-06 DIAGNOSIS — Z6841 Body Mass Index (BMI) 40.0 and over, adult: Secondary | ICD-10-CM | POA: Diagnosis not present

## 2017-01-06 DIAGNOSIS — G4733 Obstructive sleep apnea (adult) (pediatric): Secondary | ICD-10-CM | POA: Insufficient documentation

## 2017-01-06 DIAGNOSIS — I480 Paroxysmal atrial fibrillation: Secondary | ICD-10-CM | POA: Insufficient documentation

## 2017-01-07 ENCOUNTER — Encounter: Payer: Self-pay | Admitting: Family Medicine

## 2017-01-08 ENCOUNTER — Other Ambulatory Visit: Payer: Self-pay | Admitting: Pharmacist

## 2017-01-08 NOTE — Patient Outreach (Signed)
[facility]) Care Management  01/08/2017  Scott Rollins Jul 25, 1950 [id number]   Patient called to check on the status of his medication assistance application.  HIPAA identifiers were obtained.  Patient reported he called [facility] and was told they received the application but pages 4 and 5 were missing.  A message was left for Heather at Dr. Rulon Sera office with a request to refax the application to [place] or at least pages 4 and 5.  Patient was called back to let him know the status.  Plan:  Follow up with [place] in 3-5 business days.   [name], PharmD, [place] Clinical Pharmacist [phone]

## 2017-01-09 ENCOUNTER — Ambulatory Visit: Payer: Self-pay | Admitting: Pharmacist

## 2017-01-15 ENCOUNTER — Telehealth: Payer: Self-pay

## 2017-01-15 ENCOUNTER — Emergency Department (HOSPITAL_COMMUNITY): Payer: Medicare Other

## 2017-01-15 ENCOUNTER — Other Ambulatory Visit: Payer: Self-pay

## 2017-01-15 ENCOUNTER — Inpatient Hospital Stay (HOSPITAL_COMMUNITY)
Admission: EM | Admit: 2017-01-15 | Discharge: 2017-01-17 | DRG: 190 | Disposition: A | Discharge status: Home / Self Care | Payer: Medicare Other | Attending: Internal Medicine | Admitting: Internal Medicine

## 2017-01-15 ENCOUNTER — Encounter (HOSPITAL_COMMUNITY): Payer: Self-pay | Admitting: Emergency Medicine

## 2017-01-15 DIAGNOSIS — J449 Chronic obstructive pulmonary disease, unspecified: Secondary | ICD-10-CM | POA: Diagnosis not present

## 2017-01-15 DIAGNOSIS — I4891 Unspecified atrial fibrillation: Secondary | ICD-10-CM

## 2017-01-15 DIAGNOSIS — G894 Chronic pain syndrome: Secondary | ICD-10-CM | POA: Diagnosis present

## 2017-01-15 DIAGNOSIS — Z8546 Personal history of malignant neoplasm of prostate: Secondary | ICD-10-CM

## 2017-01-15 DIAGNOSIS — E860 Dehydration: Secondary | ICD-10-CM | POA: Diagnosis present

## 2017-01-15 DIAGNOSIS — N179 Acute kidney failure, unspecified: Secondary | ICD-10-CM | POA: Diagnosis not present

## 2017-01-15 DIAGNOSIS — J9601 Acute respiratory failure with hypoxia: Secondary | ICD-10-CM | POA: Diagnosis present

## 2017-01-15 DIAGNOSIS — I471 Supraventricular tachycardia: Secondary | ICD-10-CM

## 2017-01-15 DIAGNOSIS — F17210 Nicotine dependence, cigarettes, uncomplicated: Secondary | ICD-10-CM | POA: Diagnosis not present

## 2017-01-15 DIAGNOSIS — I10 Essential (primary) hypertension: Secondary | ICD-10-CM | POA: Diagnosis not present

## 2017-01-15 DIAGNOSIS — Z9119 Patient's noncompliance with other medical treatment and regimen: Secondary | ICD-10-CM

## 2017-01-15 DIAGNOSIS — Z923 Personal history of irradiation: Secondary | ICD-10-CM

## 2017-01-15 DIAGNOSIS — Z9981 Dependence on supplemental oxygen: Secondary | ICD-10-CM

## 2017-01-15 DIAGNOSIS — I248 Other forms of acute ischemic heart disease: Secondary | ICD-10-CM | POA: Diagnosis not present

## 2017-01-15 DIAGNOSIS — I480 Paroxysmal atrial fibrillation: Secondary | ICD-10-CM | POA: Diagnosis not present

## 2017-01-15 DIAGNOSIS — R0602 Shortness of breath: Secondary | ICD-10-CM | POA: Diagnosis not present

## 2017-01-15 DIAGNOSIS — I252 Old myocardial infarction: Secondary | ICD-10-CM

## 2017-01-15 DIAGNOSIS — E785 Hyperlipidemia, unspecified: Secondary | ICD-10-CM | POA: Diagnosis present

## 2017-01-15 DIAGNOSIS — F17200 Nicotine dependence, unspecified, uncomplicated: Secondary | ICD-10-CM | POA: Diagnosis present

## 2017-01-15 DIAGNOSIS — J9811 Atelectasis: Secondary | ICD-10-CM | POA: Diagnosis present

## 2017-01-15 DIAGNOSIS — R972 Elevated prostate specific antigen [PSA]: Secondary | ICD-10-CM | POA: Diagnosis present

## 2017-01-15 DIAGNOSIS — J441 Chronic obstructive pulmonary disease with (acute) exacerbation: Secondary | ICD-10-CM | POA: Diagnosis not present

## 2017-01-15 DIAGNOSIS — R7303 Prediabetes: Secondary | ICD-10-CM | POA: Diagnosis present

## 2017-01-15 DIAGNOSIS — Z6839 Body mass index (BMI) 39.0-39.9, adult: Secondary | ICD-10-CM

## 2017-01-15 DIAGNOSIS — R000 Tachycardia, unspecified: Secondary | ICD-10-CM | POA: Diagnosis not present

## 2017-01-15 DIAGNOSIS — Z8249 Family history of ischemic heart disease and other diseases of the circulatory system: Secondary | ICD-10-CM

## 2017-01-15 DIAGNOSIS — F17213 Nicotine dependence, cigarettes, with withdrawal: Secondary | ICD-10-CM | POA: Diagnosis not present

## 2017-01-15 DIAGNOSIS — G4733 Obstructive sleep apnea (adult) (pediatric): Secondary | ICD-10-CM | POA: Diagnosis present

## 2017-01-15 DIAGNOSIS — Z9221 Personal history of antineoplastic chemotherapy: Secondary | ICD-10-CM

## 2017-01-15 DIAGNOSIS — I2510 Atherosclerotic heart disease of native coronary artery without angina pectoris: Secondary | ICD-10-CM | POA: Diagnosis not present

## 2017-01-15 DIAGNOSIS — I2583 Coronary atherosclerosis due to lipid rich plaque: Secondary | ICD-10-CM

## 2017-01-15 DIAGNOSIS — J438 Other emphysema: Secondary | ICD-10-CM | POA: Diagnosis present

## 2017-01-15 DIAGNOSIS — Z7982 Long term (current) use of aspirin: Secondary | ICD-10-CM

## 2017-01-15 DIAGNOSIS — M1A9XX0 Chronic gout, unspecified, without tophus (tophi): Secondary | ICD-10-CM | POA: Diagnosis present

## 2017-01-15 DIAGNOSIS — Z955 Presence of coronary angioplasty implant and graft: Secondary | ICD-10-CM

## 2017-01-15 DIAGNOSIS — Z79899 Other long term (current) drug therapy: Secondary | ICD-10-CM

## 2017-01-15 DIAGNOSIS — R05 Cough: Secondary | ICD-10-CM | POA: Diagnosis not present

## 2017-01-15 DIAGNOSIS — I5033 Acute on chronic diastolic (congestive) heart failure: Secondary | ICD-10-CM | POA: Diagnosis not present

## 2017-01-15 DIAGNOSIS — Z888 Allergy status to other drugs, medicaments and biological substances status: Secondary | ICD-10-CM

## 2017-01-15 DIAGNOSIS — F329 Major depressive disorder, single episode, unspecified: Secondary | ICD-10-CM | POA: Diagnosis present

## 2017-01-15 DIAGNOSIS — G629 Polyneuropathy, unspecified: Secondary | ICD-10-CM | POA: Diagnosis present

## 2017-01-15 DIAGNOSIS — I110 Hypertensive heart disease with heart failure: Secondary | ICD-10-CM | POA: Diagnosis present

## 2017-01-15 DIAGNOSIS — F1210 Cannabis abuse, uncomplicated: Secondary | ICD-10-CM | POA: Diagnosis present

## 2017-01-15 HISTORY — DX: Dependence on supplemental oxygen: Z99.81

## 2017-01-15 HISTORY — DX: Nonrheumatic mitral (valve) prolapse: I34.1

## 2017-01-15 HISTORY — DX: Cardiac murmur, unspecified: R01.1

## 2017-01-15 HISTORY — DX: Angina pectoris, unspecified: I20.9

## 2017-01-15 HISTORY — DX: Prediabetes: R73.03

## 2017-01-15 HISTORY — DX: Personal history of other diseases of the digestive system: Z87.19

## 2017-01-15 HISTORY — DX: Migraine, unspecified, not intractable, without status migrainosus: G43.909

## 2017-01-15 HISTORY — DX: Pneumonia, unspecified organism: J18.9

## 2017-01-15 HISTORY — DX: Unspecified osteoarthritis, unspecified site: M19.90

## 2017-01-15 LAB — CBC WITH DIFFERENTIAL/PLATELET
BASOS PCT: 0 %
Basophils Absolute: 0 10*3/uL (ref 0.0–0.1)
EOS ABS: 0.1 10*3/uL (ref 0.0–0.7)
EOS PCT: 1 %
HCT: 47.7 % (ref 39.0–52.0)
HEMOGLOBIN: 15 g/dL (ref 13.0–17.0)
Lymphocytes Relative: 13 %
Lymphs Abs: 1.7 10*3/uL (ref 0.7–4.0)
MCH: 28.1 pg (ref 26.0–34.0)
MCHC: 31.4 g/dL (ref 30.0–36.0)
MCV: 89.3 fL (ref 78.0–100.0)
MONOS PCT: 8 %
Monocytes Absolute: 1.1 10*3/uL — ABNORMAL HIGH (ref 0.1–1.0)
Neutro Abs: 10.5 10*3/uL — ABNORMAL HIGH (ref 1.7–7.7)
Neutrophils Relative %: 78 %
PLATELETS: 356 10*3/uL (ref 150–400)
RBC: 5.34 MIL/uL (ref 4.22–5.81)
RDW: 14.4 % (ref 11.5–15.5)
WBC: 13.3 10*3/uL — ABNORMAL HIGH (ref 4.0–10.5)

## 2017-01-15 LAB — URINALYSIS, ROUTINE W REFLEX MICROSCOPIC
GLUCOSE, UA: 100 mg/dL — AB
HGB URINE DIPSTICK: NEGATIVE
Ketones, ur: 40 mg/dL — AB
Leukocytes, UA: NEGATIVE
Nitrite: POSITIVE — AB
PROTEIN: 100 mg/dL — AB
Specific Gravity, Urine: 1.025 (ref 1.005–1.030)
pH: 5.5 (ref 5.0–8.0)

## 2017-01-15 LAB — COMPREHENSIVE METABOLIC PANEL
ALBUMIN: 3.3 g/dL — AB (ref 3.5–5.0)
ALK PHOS: 180 U/L — AB (ref 38–126)
ALT: 19 U/L (ref 17–63)
AST: 20 U/L (ref 15–41)
Anion gap: 17 — ABNORMAL HIGH (ref 5–15)
BUN: 23 mg/dL — ABNORMAL HIGH (ref 6–20)
CHLORIDE: 95 mmol/L — AB (ref 101–111)
CO2: 25 mmol/L (ref 22–32)
Calcium: 9.1 mg/dL (ref 8.9–10.3)
Creatinine, Ser: 1.17 mg/dL (ref 0.61–1.24)
GFR calc non Af Amer: >60 mL/min (ref >60)
GLUCOSE: 99 mg/dL (ref 65–99)
POTASSIUM: 4.2 mmol/L (ref 3.5–5.1)
SODIUM: 137 mmol/L (ref 135–145)
Total Bilirubin: 2.7 mg/dL — ABNORMAL HIGH (ref 0.3–1.2)
Total Protein: 7.4 g/dL (ref 6.5–8.1)

## 2017-01-15 LAB — LACTIC ACID, PLASMA
LACTIC ACID, VENOUS: 1 mmol/L (ref 0.5–1.9)
Lactic Acid, Venous: 1.5 mmol/L (ref 0.5–1.9)
Lactic Acid, Venous: 1.5 mmol/L (ref 0.5–1.9)
Lactic Acid, Venous: 1.5 mmol/L (ref 0.5–1.9)
Lactic Acid, Venous: 1.5 mmol/L (ref 0.5–1.9)

## 2017-01-15 LAB — URINALYSIS, MICROSCOPIC (REFLEX)

## 2017-01-15 LAB — BRAIN NATRIURETIC PEPTIDE: B Natriuretic Peptide: 144.7 pg/mL — ABNORMAL HIGH (ref 0.0–100.0)

## 2017-01-15 LAB — APTT: APTT: 45 s — AB (ref 24–36)

## 2017-01-15 LAB — I-STAT ARTERIAL BLOOD GAS, ED
Acid-Base Excess: 2 mmol/L (ref 0.0–2.0)
Bicarbonate: 28.6 mmol/L — ABNORMAL HIGH (ref 20.0–28.0)
O2 Saturation: 95 %
PCO2 ART: 49 mmHg — AB (ref 32.0–48.0)
PH ART: 7.373 (ref 7.350–7.450)
TCO2: 30 mmol/L (ref 0–100)
pO2, Arterial: 80 mmHg — ABNORMAL LOW (ref 83.0–108.0)

## 2017-01-15 LAB — I-STAT CG4 LACTIC ACID, ED: LACTIC ACID, VENOUS: 2.29 mmol/L — AB (ref 0.5–1.9)

## 2017-01-15 LAB — PROTIME-INR
INR: 1.11
PROTHROMBIN TIME: 14.4 s (ref 11.4–15.2)

## 2017-01-15 LAB — MRSA PCR SCREENING: MRSA by PCR: NEGATIVE

## 2017-01-15 LAB — PHOSPHORUS: Phosphorus: 4.6 mg/dL (ref 2.5–4.6)

## 2017-01-15 LAB — TROPONIN I
TROPONIN I: 0.03 ng/mL — AB (ref <0.03)
Troponin I: 0.04 ng/mL (ref <0.03)

## 2017-01-15 LAB — EXPECTORATED SPUTUM ASSESSMENT W REFEX TO RESP CULTURE

## 2017-01-15 LAB — EXPECTORATED SPUTUM ASSESSMENT W GRAM STAIN, RFLX TO RESP C

## 2017-01-15 LAB — PROCALCITONIN: PROCALCITONIN: 0.68 ng/mL

## 2017-01-15 LAB — MAGNESIUM: MAGNESIUM: 2.1 mg/dL (ref 1.7–2.4)

## 2017-01-15 LAB — I-STAT TROPONIN, ED: TROPONIN I, POC: 0.02 ng/mL (ref 0.00–0.08)

## 2017-01-15 MED ORDER — ENOXAPARIN SODIUM 40 MG/0.4ML ~~LOC~~ SOLN: 40.0000 mg | SUBCUTANEOUS | Status: DC

## 2017-01-15 MED ORDER — ZOLPIDEM TARTRATE 5 MG PO TABS
5.0000 mg | ORAL_TABLET | Freq: Every evening | ORAL | Status: DC | PRN
  Administered 2017-01-15: 5 mg via ORAL
  Filled 2017-01-15: qty 1

## 2017-01-15 MED ORDER — BISOPROLOL-HYDROCHLOROTHIAZIDE 5-6.25 MG PO TABS
1.0000 | ORAL_TABLET | Freq: Every morning | ORAL | Status: DC
  Administered 2017-01-16 – 2017-01-17 (×2): 1 via ORAL
  Filled 2017-01-15 (×2): qty 1

## 2017-01-15 MED ORDER — LISINOPRIL 20 MG PO TABS
20.0000 mg | ORAL_TABLET | Freq: Two times a day (BID) | ORAL | Status: DC
  Administered 2017-01-16 – 2017-01-17 (×3): 20 mg via ORAL
  Filled 2017-01-15 (×3): qty 1

## 2017-01-15 MED ORDER — GABAPENTIN 300 MG PO CAPS
300.0000 mg | ORAL_CAPSULE | Freq: Every day | ORAL | Status: DC
  Administered 2017-01-15 – 2017-01-16 (×2): 300 mg via ORAL
  Filled 2017-01-15 (×2): qty 1

## 2017-01-15 MED ORDER — DEXTROSE 5 % IV SOLN
250.0000 mg | INTRAVENOUS | Status: DC
  Administered 2017-01-15 – 2017-01-17 (×3): 250 mg via INTRAVENOUS
  Filled 2017-01-15 (×4): qty 250

## 2017-01-15 MED ORDER — DILTIAZEM HCL 100 MG IV SOLR
5.0000 mg/h | INTRAVENOUS | Status: DC
  Administered 2017-01-15: 10 mg/h via INTRAVENOUS
  Administered 2017-01-15: 5 mg/h via INTRAVENOUS
  Administered 2017-01-16 (×2): 10 mg/h via INTRAVENOUS
  Filled 2017-01-15 (×4): qty 100

## 2017-01-15 MED ORDER — SODIUM CHLORIDE 0.9 % IV SOLN
INTRAVENOUS | Status: DC
  Administered 2017-01-15: 100 mL/h via INTRAVENOUS
  Administered 2017-01-15 – 2017-01-16 (×2): via INTRAVENOUS

## 2017-01-15 MED ORDER — SODIUM CHLORIDE 0.9% FLUSH
3.0000 mL | Freq: Two times a day (BID) | INTRAVENOUS | Status: DC
  Administered 2017-01-16 (×2): 3 mL via INTRAVENOUS

## 2017-01-15 MED ORDER — IPRATROPIUM-ALBUTEROL 0.5-2.5 (3) MG/3ML IN SOLN
3.0000 mL | Freq: Four times a day (QID) | RESPIRATORY_TRACT | Status: DC
  Administered 2017-01-15 – 2017-01-17 (×6): 3 mL via RESPIRATORY_TRACT
  Filled 2017-01-15 (×7): qty 3

## 2017-01-15 MED ORDER — DM-GUAIFENESIN ER 30-600 MG PO TB12: 1.0000 | ORAL_TABLET | Freq: Two times a day (BID) | ORAL | Status: DC | PRN

## 2017-01-15 MED ORDER — ALLOPURINOL 300 MG PO TABS
300.0000 mg | ORAL_TABLET | Freq: Every day | ORAL | Status: DC
  Administered 2017-01-16 – 2017-01-17 (×2): 300 mg via ORAL
  Filled 2017-01-15 (×2): qty 1

## 2017-01-15 MED ORDER — ENOXAPARIN SODIUM 150 MG/ML ~~LOC~~ SOLN
1.0000 mg/kg | Freq: Two times a day (BID) | SUBCUTANEOUS | Status: DC
  Administered 2017-01-15 – 2017-01-17 (×5): 130 mg via SUBCUTANEOUS
  Filled 2017-01-15 (×2): qty 1
  Filled 2017-01-15: qty 0.85
  Filled 2017-01-15 (×2): qty 1

## 2017-01-15 MED ORDER — ACETAMINOPHEN 325 MG PO TABS
650.0000 mg | ORAL_TABLET | Freq: Four times a day (QID) | ORAL | Status: DC | PRN
  Administered 2017-01-16: 650 mg via ORAL
  Filled 2017-01-15: qty 2

## 2017-01-15 MED ORDER — ACETAMINOPHEN 650 MG RE SUPP: 650.0000 mg | Freq: Four times a day (QID) | RECTAL | Status: DC | PRN

## 2017-01-15 MED ORDER — HYDROCODONE-ACETAMINOPHEN 5-325 MG PO TABS
1.0000 | ORAL_TABLET | ORAL | Status: DC | PRN
  Administered 2017-01-16 (×2): 2 via ORAL
  Filled 2017-01-15 (×2): qty 2

## 2017-01-15 MED ORDER — ASPIRIN EC 81 MG PO TBEC: 162.0000 mg | DELAYED_RELEASE_TABLET | Freq: Every day | ORAL | Status: DC | PRN

## 2017-01-15 MED ORDER — ONDANSETRON HCL 4 MG PO TABS: 4.0000 mg | ORAL_TABLET | Freq: Four times a day (QID) | ORAL | Status: DC | PRN

## 2017-01-15 MED ORDER — METOPROLOL TARTRATE 5 MG/5ML IV SOLN
5.0000 mg | Freq: Once | INTRAVENOUS | Status: AC
  Administered 2017-01-15: 5 mg via INTRAVENOUS
  Filled 2017-01-15: qty 5

## 2017-01-15 MED ORDER — ONDANSETRON HCL 4 MG/2ML IJ SOLN
4.0000 mg | Freq: Four times a day (QID) | INTRAMUSCULAR | Status: DC | PRN
  Administered 2017-01-15: 4 mg via INTRAVENOUS
  Filled 2017-01-15: qty 2

## 2017-01-15 MED ORDER — ENSURE ENLIVE PO LIQD
237.0000 mL | Freq: Two times a day (BID) | ORAL | Status: DC
  Administered 2017-01-16 – 2017-01-17 (×3): 237 mL via ORAL

## 2017-01-15 MED ORDER — BISACODYL 10 MG RE SUPP: 10.0000 mg | Freq: Every day | RECTAL | Status: DC | PRN

## 2017-01-15 MED ORDER — LEVALBUTEROL HCL 0.63 MG/3ML IN NEBU: 0.6300 mg | INHALATION_SOLUTION | Freq: Four times a day (QID) | RESPIRATORY_TRACT | Status: DC | PRN

## 2017-01-15 MED ORDER — SENNOSIDES-DOCUSATE SODIUM 8.6-50 MG PO TABS: 1.0000 | ORAL_TABLET | Freq: Every evening | ORAL | Status: DC | PRN

## 2017-01-15 MED ORDER — CITALOPRAM HYDROBROMIDE 10 MG PO TABS
40.0000 mg | ORAL_TABLET | Freq: Every day | ORAL | Status: DC
  Administered 2017-01-15 – 2017-01-16 (×2): 40 mg via ORAL
  Filled 2017-01-15 (×2): qty 4

## 2017-01-15 MED ORDER — DEXTROSE 5 % IV SOLN
1.0000 g | INTRAVENOUS | Status: DC
  Administered 2017-01-15 – 2017-01-17 (×3): 1 g via INTRAVENOUS
  Filled 2017-01-15 (×3): qty 10

## 2017-01-15 MED ORDER — SODIUM CHLORIDE 0.9 % IV BOLUS (SEPSIS)
500.0000 mL | Freq: Once | INTRAVENOUS | Status: AC
  Administered 2017-01-15: 500 mL via INTRAVENOUS

## 2017-01-15 MED ORDER — ASPIRIN 325 MG PO TABS: 325.0000 mg | ORAL_TABLET | Freq: Every day | ORAL | Status: DC

## 2017-01-15 MED ORDER — MORPHINE SULFATE (PF) 4 MG/ML IV SOLN
4.0000 mg | INTRAVENOUS | Status: DC | PRN
  Administered 2017-01-15: 4 mg via INTRAVENOUS
  Filled 2017-01-15: qty 1

## 2017-01-15 NOTE — ED Notes (Signed)
Pt noted  as daiphoretic. Temp 97.9 [address] PA  At bedside.

## 2017-01-15 NOTE — ED Notes (Addendum)
Pt becomes nauseated and increasing diaphoresis. Mask removed to counter aspiration.  [facility] PA aware.

## 2017-01-15 NOTE — H&P (Signed)
History and Physical    Scott Rollins MHD:[id number] DOB: Nov 21, 1950 DOA: 01/15/2017   PCP: [name], MD   Patient coming from:  Home    Chief Complaint: cough, fever, palpitations  HPI: Scott Rollins is a 66 y.o. male with medical history significant for COPD, anxiety, depression, gout, hyperlipidemia, hypertension, CAD status post MI, OSA, prediabetes, history of seizures, history of prostate cancer status post radiation and chemotherapy, presenting to the ER for evaluation of progressive episode of shortness of breath with chest tightness and palpitations. He has not been feeling well for the last several days. He had been recently [place]. During that time, the patient experience atrial tachycardia, that they were felt to be likely due to atrial fibrillation, which was followed as an outpatient by a local cardiologist. On presentation, the patient had increasing palpitations.He had chills and subjective fevers and productive green cough. He denies any nausea or vomiting. He denies any lightheadedness, syncope or presyncope. He denies any worsening leg swelling. He denies any abdominal pain, dysuria hematuria or diarrhea.  At the ER, he was tachycardic with rate up tot he 150's. EKG confirmed intermittent Afib-SR episodes. Received IV Lopressor 5 mg without significant improvement. Cardiology to see. He is being admitted for further management.   ED Course:  BP 106/74 (BP Location: Right Arm)   Pulse 94   Temp 98.1 F (36.7 C) (Axillary)   Resp (!) 25   Ht 5' 10.5" (1.791 m)   Wt 127.5 kg (281 lb)   SpO2 97%   BMI 39.75 kg/m   CXR neg for infiltrate  sodium 137 potassium 4.2 bicarb 25 creatinine 1.17 calcium 9.1  bilirubin is 2.7, in the setting of dehydration, infection BNP 144.7 Tn 0.02 LA 2.29  WBC 13.3 Hb 15 Plt 356 PT/INR 14.4/1.11 EKG Afib to SR  recent echo 01/06/2017 shows ejection fraction 55 to  21%, grade 1 diastolic dysfunction, normal systolic   Review of Systems:  As per HPI otherwise all other systems reviewed and are negative  Past Medical History:  Diagnosis Date  . Anxiety   . 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 COPD (chronic obstructive pulmonary disease) ([place])    "pearl study" pt thinks he is on Symbicort; pt noncompliant with COPD controller meds on/off due to financial reasons.  . Coronary atherosclerosis of unspecified type of vessel, native or graft 2005   MI, s/p PCI with stent (pt reports 20+ interventions in the past, most recent cat 6/08 showed patent stents).  Normal LV function.  Nuclear stress test NEG 8/09.  . Depression    ?bipolar dx by psychiatrist?  . Diverticulosis 2006  . ED (erectile dysfunction)   . Gouty arthritis    Dr. Amil Amen  . Hematochezia 09/2012   Hyperplastic polyp, diverticulosis, and internal hemorrhoids found on colonoscopy 10/2012  . Hyperlipidemia    Hx of multiple statin intolerance  . Hypertension   . 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 Myocardial infarction ([place])    x 2 or 3  . Neuropathic pain of both feet 2015/2016   Vit B12 and A1c normal 10/2014  . Obesities, morbid ([place])   . OSA on CPAP   . Osteoarthrosis, unspecified whether generalized or localized, unspecified site    DDD  . Prediabetes 2016/17  . Prostate cancer ([place]) 09/2010   Localized, high risk disease (Dr. Kimbrough/Grapey):  s/p 5 wks ext bm rad + seed boost, as well as hormone blockade x 1 yr.  Biochemical recurrence 11/2015--urologist did bone scan and spots on T spine and L iliac region came up, pt needs plain films of pelvis/T spine to see if DJD correlate present.  He'll eventually need to resume androgen depriv therapy.  . Radiation    Pelvic--for prostate ca: 25 treatments/01/2011  . Seizure disorder ([place]) 08-15-2011   Deja vu sensations: no w/u.  These stopped when neurontin 316m tid was started for  a different reason.  . Status post chemotherapy    10/2010 thru 06/2011    Past Surgical History:  Procedure Laterality Date  . CARDIAC CATHETERIZATION  23 caths   pt states he has 6 stents  . COLONOSCOPY WITH PROPOFOL N/A 04/05/2013   Dr. SFuller Plan  Polypectomy (hyperplastic--recall 10 yrs).  Moderate diverticulosis, +internal hemorrhoids.  No radiation proctitis.  .Marland KitchenHERNIA REPAIR  yrs ago   [employer]  . HOT HEMOSTASIS N/A 04/05/2013   Procedure: HOT HEMOSTASIS (ARGON PLASMA COAGULATION/BICAP);  Surgeon: MLadene Artist MD;  [facility];  Service: Endoscopy;  Laterality: N/A;  . seed implants     02/24/2011/ in prostate  . TRANSTHORACIC ECHOCARDIOGRAM  12/2016   EF 55-60%, moderate LVH, grd I DD, mild LA dilation.    Social History Social History   Social History  . Marital status: Married    Spouse name: [name]  . Number of children: N/A  . Years of education: N/A   Occupational History  . [employer]   Social History Main Topics  . Smoking status: Current Every Day Smoker    Packs/day: 1.00    Years: 46.00    Types: Cigarettes    Last attempt to quit: 07/18/2010  . Smokeless tobacco: Never Used  . Alcohol use Yes     Comment: occasional  . Drug use: Yes    Types: Marijuana  . Sexual activity: Not on file   Other Topics Concern  . Not on file   Social History Narrative   ** Merged History Encounter **   [place]  . Amitriptyline Hcl     REACTION: nausea, elevated bp  . Ezetimibe     REACTION: chest pain, fatigue    Family History  Problem Relation Age of Onset  . Arthritis Mother   . Heart disease Father   . Bipolar disorder Father       Prior to Admission medications   Medication Sig Start Date End Date Taking? Authorizing Provider  acetaminophen (TYLENOL) 500 MG tablet Take 500 mg by mouth every 6 (six) hours as needed for mild pain.     Yes [provider]  albuterol (PROVENTIL HFA;VENTOLIN HFA) 108 (90 Base) MCG/ACT inhaler Inhale 2 puffs into the lungs every 4 (four) hours as needed for wheezing or shortness of breath.   Yes [provider]  albuterol (PROVENTIL) (2.5 MG/3ML) 0.083% nebulizer solution USE ONE VIAL IN NEBULIZER EVERY 4 HOURS AS NEEDED FOR SHORTNESS OF BREATH OR WHEEZING   Yes [provider]  allopurinol (ZYLOPRIM) 300 MG tablet Take 1 tablet (300 mg total) by mouth daily. 12/17/16  Yes [name], PAdrian Blackwater MD  aspirin 325 MG tablet Take 325 mg by mouth daily.     Yes [provider]  aspirin EC 81 MG tablet Take 162 mg  by mouth daily as needed (pain).   Yes [provider]  bisoprolol-hydrochlorothiazide (ZIAC) 5-6.25 MG tablet Take 1 tablet by mouth every morning. 12/17/16  Yes [name], [name], MD  citalopram (CELEXA) 40 MG tablet Take 1 tablet (40 mg total) by mouth at bedtime. 12/17/16  Yes [name], [name], MD  cyclobenzaprine (FLEXERIL) 10 MG tablet Take 1 tablet (10 mg total) by mouth at bedtime. 12/17/16  Yes [name], [name], MD  dextromethorphan-guaiFENesin [facility] DM) 30-600 MG 12hr tablet Take 1 tablet by mouth 2 (two) times daily as needed for cough.   Yes [provider]  furosemide (LASIX) 40 MG tablet Take 2 tablets (80 mg total) by mouth daily as needed. Patient taking differently: Take 80 mg by mouth daily as needed for fluid.  12/10/16  Yes [name], [name], MD  gabapentin (NEURONTIN) 300 MG capsule Take 300 mg by mouth at bedtime.    Yes [provider]  guaiFENesin-dextromethorphan (ROBITUSSIN DM) 100-10 MG/5ML syrup Take 5 mLs by mouth every 4 (four) hours as needed for cough. 11/07/16  Yes Nita Sells, MD  lisinopril (PRINIVIL,ZESTRIL) 20 MG tablet TAKE ONE TABLET BY MOUTH TWICE DAILY Patient taking differently: TAKE ONE TABLET BY MOUTH DAILY 12/31/16  Yes [name], [name], MD  naproxen (NAPROSYN) 500 MG tablet Take 500 mg by  mouth 2 (two) times daily as needed for mild pain.    Yes [provider]  Oxycodone HCl 10 MG TABS 1 tab po tid x 10d, then 1 tab po bid x 10d, then 1 tab po qd x 10, then 1/2 tab po qd x 10 days, then stop. Patient taking differently: Take 10 mg by mouth daily as needed (pain).  12/23/16  Yes [name], [name], MD  Probiotic Product (PROBIOTIC PO) Take 1 capsule by mouth daily.   Yes [provider]  zolpidem (AMBIEN) 10 MG tablet Take 1 tablet (10 mg total) by mouth at bedtime as needed for sleep. 12/17/16  Yes [name], [name], MD  budesonide-formoterol [facility]) 160-4.5 MCG/ACT inhaler Inhale 2 puffs into the lungs 2 (two) times daily. Patient not taking: Reported on 01/15/2017 01/01/17   [name], MD    Physical Exam:  Vitals:   01/15/17 0615 01/15/17 0277 01/15/17 0633  BP: 106/74    Pulse: 94    Resp: (!) 25    Temp: 98.1 F (36.7 C)    TempSrc: Axillary    SpO2: 97% 97%   Weight:   127.5 kg (281 lb)  Height:   5' 10.5" (1.791 m)   Constitutional: Very uncomfortable, diaphoretic, ill appearing  Eyes: PERRL, lids and conjunctivae normal ENMT: Mucous membranes are moist, without exudate or lesions  Neck: normal, supple, no masses, no thyromegaly Respiratory:  tachypneic, Essentially clear to auscultation bilaterally, no wheezing, trace crackles. Normal respiratory effort, no accessory muscle use Cardiovascular: tachycardic irregular rate and rhythm, cannot assess murmurs, rubs or gallops. No extremity edema. 2+ pedal pulses. No carotid bruits.  Abdomen: Soft, non tender, No hepatosplenomegaly. Bowel sounds positive.  Musculoskeletal: no clubbing / cyanosis. Moves all extremities Skin: no jaundice, No lesions.  Neurologic: Sensation intact  Strength equal in all extremities Psychiatric:   Alert and oriented x 3. Normal mood.     Labs on Admission: I have personally reviewed following labs and imaging studies  CBC:  Recent Labs Lab 01/15/17 0630    WBC 13.3*  NEUTROABS 10.5*  HGB 15.0  HCT 47.7  MCV 89.3  PLT 356  Basic Metabolic Panel:  Recent Labs Lab 01/15/17 0630  NA 137  K 4.2  CL 95*  CO2 25  GLUCOSE 99  BUN 23*  CREATININE 1.17  CALCIUM 9.1    GFR: Estimated Creatinine Clearance: 82.8 mL/min (by C-G formula based on SCr of 1.17 mg/dL).  Liver Function Tests:  Recent Labs Lab 01/15/17 0630  AST 20  ALT 19  ALKPHOS 180*  BILITOT 2.7*  PROT 7.4  ALBUMIN 3.3*   No results for input(s): LIPASE, AMYLASE in the last 168 hours. No results for input(s): AMMONIA in the last 168 hours.  Coagulation Profile:  Recent Labs Lab 01/15/17 0630  INR 1.11    Cardiac Enzymes: No results for input(s): CKTOTAL, CKMB, CKMBINDEX, TROPONINI in the last 168 hours.  BNP (last 3 results) No results for input(s): PROBNP in the last 8760 hours.  HbA1C: No results for input(s): HGBA1C in the last 72 hours.  CBG: No results for input(s): GLUCAP in the last 168 hours.  Lipid Profile: No results for input(s): CHOL, HDL, LDLCALC, TRIG, CHOLHDL, LDLDIRECT in the last 72 hours.  Thyroid Function Tests: No results for input(s): TSH, T4TOTAL, FREET4, T3FREE, THYROIDAB in the last 72 hours.  Anemia Panel: No results for input(s): VITAMINB12, FOLATE, FERRITIN, TIBC, IRON, RETICCTPCT in the last 72 hours.  Urine analysis:    Component Value Date/Time   COLORURINE yellow 08/21/2010 0831   APPEARANCEUR Clear 08/21/2010 0831   LABSPEC <1.005 08/21/2010 0831   PHURINE 6.0 08/21/2010 0831   HGBUR negative 08/21/2010 0831   BILIRUBINUR negative 08/21/2010 0831   UROBILINOGEN 0.2 08/21/2010 0831   NITRITE negative 08/21/2010 0831    Sepsis Labs: _0 (procalcitonin:4,lacticidven:4) )No results found for this or any previous visit (from the past 240 hour(s)).   Radiological Exams on Admission: Dg Chest Port 1 View  Result Date: 01/15/2017 CLINICAL DATA:  Acute onset of cough and intermittent tachycardia.  Atrial fibrillation. Initial encounter. EXAM: PORTABLE CHEST 1 VIEW COMPARISON:  Chest radiograph performed 12/22/2016 FINDINGS: The lungs are well-aerated. Mild bibasilar opacities likely reflect atelectasis. There is no evidence of pleural effusion or pneumothorax. The cardiomediastinal silhouette is within normal limits. No acute osseous abnormalities are seen. IMPRESSION: Mild bibasilar opacities likely reflect atelectasis. Lungs otherwise clear. Electronically Signed   By: Garald Balding M.D.   On: 01/15/2017 06:57    EKG: Independently reviewed.  Assessment/Plan Active Problems:   COPD with acute exacerbation ([place])   Essential hypertension   Coronary artery disease due to lipid rich plaque   EMPHYSEMA   TOBACCO ABUSE   ELEVATED PROSTATE SPECIFIC ANTIGEN   Chronic gout   Chronic pain syndrome   COPD (chronic obstructive pulmonary disease) ([facility])   OSA (obstructive sleep apnea)   Atrial fibrillation ([facility])       Acute respiratory failure without hypoxia likely secondary to acute COPD exacerbation CXR neg for infiltrates. WBC 13  Afebrile   VSS Bicarb 25 . Lactate 2.66  tachypneic and tachycardic. Recent treatment for CAP .   Admit to Stepdown tele  Levaquin Xopenex prn due to tachycardia   Mucinex Check AbG Lactic acid CBC in am Incentive spirometry Respiratory therapy consult BiPAP CXR in am  Rocephin IV Add azithromycin IV  Panculture  Atrial Fibrillation versus atrial arrythmia . Received Lopressor due to Cardizem shortage. Patient symptomatic for palpitations EKG Afib vs MAT. recent echo 01/06/2017 shows ejection fraction 55 to 29%, grade 1 diastolic dysfunction, normal systolic. BNP 144.7.  PT/INR 14.4/1.11 Tn 0.02 Cardiology to see,  appreciate their input  Continue Lopressor for now   Tobacco abuse with nicotine withdrawal Nicotine patch vs gum was declined by patient Counseled cessation   Hypertension BP 131/84   Pulse  142   Continue home anti-hypertensive  medications     OSA Continue CPAP nightly    Abnormal bili, in the setting of dehydration, meds   No history of  hepatitis per chart Last Alk Phos elevated 180, Bili2.8    Hepatitis panel Repeat LFTs  IVF at 100 cc/h   Gout Continue allopurinol   Prediabetes, with neuropathy  Check A1C Continue Neurontin  Depression  Continue Celexa     DVT prophylaxis: Lovenox  Code Status:   Full     Family Communication:  Discussed with patient Disposition Plan: Expect patient to be discharged to home after condition improves Consults called:    Cards per EDP  Admission status: SDU    [facility] E, PA-C [facility]   01/15/2017, 8:13 AM

## 2017-01-15 NOTE — Telephone Encounter (Signed)
[facility] Night - Client [place] Patient Name: Scott Rollins Gender: Male DOB: 1951-04-26 Age: 66 Y 39 M 18 D Return Phone Number: [phone] (Primary), [phone] (Secondary), [phone] (Alternate) City/State/Zip: [place] [address] Client [place] [facility] - Client Client Site [place] Night Physician Crissie Sickles - MD Who Is Calling Patient / Member / Family / Caregiver Call Type Triage / Clinical Relationship To Patient Self Return Phone Number [phone] (Primary) Chief Complaint BREATHING - shortness of breath or sounds breathless Reason for Call Symptomatic / Request for [place] says that he has COPD and he has had a flare up here lately. He is wanting to know if someone if call in a z pack for him. He is having a hard time breathing. Nurse Assessment Nurse: Loletta Specter, RN, Wells Guiles Date/Time Eilene Ghazi Time): 01/13/2017 6:23:32 PM Confirm and document reason for call. If symptomatic, describe symptoms. ---Caller states has COPD, had recent bout of pneumonia and now having SOB. Does the PT have any chronic conditions? (i.e. diabetes, asthma, etc.) ---Yes List chronic conditions. ---COPD, cancer. Guidelines Guideline Title Affirmed Question Disp. Time Eilene Ghazi Time) Disposition Final User 01/13/2017 6:27:18 PM Clinical Call Yes Loletta Specter, RN, Wells Guiles Comments User: Patsey Berthold, RN Date/Time Eilene Ghazi Time): 01/13/2017 6:26:46 PM RN advised pt rx could not be called in, but triage could be completed. Pt refused triage said he would go to UC.

## 2017-01-15 NOTE — ED Notes (Signed)
IV at r forearm pulled out. Site cleaned and covered.

## 2017-01-15 NOTE — Progress Notes (Signed)
CRITICAL VALUE ALERT  Critical Value: Troponin  Date & Time Notied:  01/15/2017 1725  Provider Notified: Dr Aggie Moats

## 2017-01-15 NOTE — Progress Notes (Signed)
Pharmacy Antibiotic Note  Scott Rollins is a 67 y.o. male admitted on 01/15/2017 with Afib and cough. Found to have COPD exacernation  Pharmacy has been consulted for ceftriaxone dosing.  Plan: Start ceftriaxone 1g IV Q24h Monitor clinical picture, renal function F/U C&S, abx deescalation / LOT  Consider transition to PO soon?  Height: 5' 10.5" (179.1 cm) Weight: 281 lb (127.5 kg) IBW/kg (Calculated) : 74.15  Temp (24hrs), Avg:98.1 F (36.7 C), Min:98.1 F (36.7 C), Max:98.1 F (36.7 C)   Recent Labs Lab 01/15/17 0630 01/15/17 0654  WBC 13.3*  --   CREATININE 1.17  --   LATICACIDVEN  --  2.29*    Estimated Creatinine Clearance: 82.8 mL/min (by C-G formula based on SCr of 1.17 mg/dL).    Allergies  Allergen Reactions  . Amitriptyline Hcl     REACTION: nausea, elevated bp  . Ezetimibe     REACTION: chest pain, fatigue   Thank you for allowing pharmacy to be a part of this patient's care.  Reginia Naas 01/15/2017 8:55 AM

## 2017-01-15 NOTE — ED Notes (Signed)
Dr. Aggie Moats at bedside and made aware of rate/rhythm, Aware that pt off cpap.

## 2017-01-15 NOTE — ED Triage Notes (Signed)
Patient arrives via EMS from home with complaint of A-Fib and cough. EMS states patient's HR has fluctuated between 80 and 170 HR. Also patient intermittently converted into NSR with EMS PTA. History of CAP in April with admission. At that time patient had Afib accompanying the CAP. Patient denies history of Afib otherwise. Endorses some subjective fevers at home. Over the past 2 days patient has had a recurrence of productive cough with dark green sputum.

## 2017-01-15 NOTE — Consult Note (Signed)
Cardiology Consultation:   Patient ID: Scott Rollins; [id number]; 15-Apr-1951   Admit date: 01/15/2017 Date of Consult: 01/15/2017  Primary Care Provider: [name], MD Primary Cardiologist: Glennis Brink  Primary Electrophysiologist:  Allred   Patient Profile:   Scott Rollins is a 66 y.o. male with a hx of COPD  who is being seen today for the evaluation of PAF/MAT at the request of Dr Aggie Moats.  History of Present Illness:   Scott Rollins 67 y.o. seen in the ER for malaise last 4 days. Has severe oxygen dependant COPD. Increased oxygen to 3 L/min 2 days ago. Mild cough and congestion. Had steroid taper in May.  Seen by [name]  12/12/16 and thought to be a poor candidate for AAT or anticoagulation Also indicated mostly MAT and "atrial arrhythmias" and not so much sustained PAF. No changes made Distant history of CAD with stents to mid RCA and Circumflex. Last cath 2008 patent No chest pain. Some diaphoresis For most part arrhythmias are asymptomatic. He was going in / out of arrhythmias both MAT and PAF during interview and was asymptomatic. No fever, clear sputum Activity limited by hip pain. Still smoking mariajuana No chest pain or syncope    Past Medical History:  Diagnosis Date  . Anxiety   . 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 COPD (chronic obstructive pulmonary disease) ([place])    "pearl study" pt thinks he is on Symbicort; pt noncompliant with COPD controller meds on/off due to financial reasons.  . Coronary atherosclerosis of unspecified type of vessel, native or graft 2005   MI, s/p PCI with stent (pt reports 20+ interventions in the past, most recent cat 6/08 showed patent stents).  Normal LV function.  Nuclear stress test NEG 8/09.  . Depression    ?bipolar dx by psychiatrist?  . Diverticulosis 2006  . ED (erectile dysfunction)   . Gouty arthritis    Dr. Amil Amen  . Hematochezia 09/2012   Hyperplastic polyp,  diverticulosis, and internal hemorrhoids found on colonoscopy 10/2012  . Hyperlipidemia    Hx of multiple statin intolerance  . Hypertension   . 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 Myocardial infarction ([place])    x 2 or 3  . Neuropathic pain of both feet 2015/2016   Vit B12 and A1c normal 10/2014  . Obesities, morbid ([place])   . OSA on CPAP   . Osteoarthrosis, unspecified whether generalized or localized, unspecified site    DDD  . Prediabetes 2016/17  . Prostate cancer ([place]) 09/2010   Localized, high risk disease (Dr. Kimbrough/Grapey):  s/p 5 wks ext bm rad + seed boost, as well as hormone blockade x 1 yr.  Biochemical recurrence 11/2015--urologist did bone scan and spots on T spine and L iliac region came up, pt needs plain films of pelvis/T spine to see if DJD correlate present.  He'll eventually need to resume androgen depriv therapy.  . Radiation    Pelvic--for prostate ca: 25 treatments/01/2011  . Seizure disorder ([place]) 08-15-2011   Deja vu sensations: no w/u.  These stopped when neurontin 300mg  tid was started for a different reason.  . Status post chemotherapy    10/2010 thru 06/2011    Past Surgical History:  Procedure Laterality Date  . CARDIAC CATHETERIZATION  23 caths   pt states he has 6 stents  . COLONOSCOPY WITH PROPOFOL N/A 04/05/2013   Dr. Fuller Plan.  Polypectomy (hyperplastic--recall  10 yrs).  Moderate diverticulosis, +internal hemorrhoids.  No radiation proctitis.  Marland Kitchen HERNIA REPAIR  yrs ago   [employer]  . HOT HEMOSTASIS N/A 04/05/2013   Procedure: HOT HEMOSTASIS (ARGON PLASMA COAGULATION/BICAP);  Surgeon: Ladene Artist, MD;  Location: [facility];  Service: Endoscopy;  Laterality: N/A;  . seed implants     02/24/2011/ in prostate  . TRANSTHORACIC ECHOCARDIOGRAM  12/2016   EF 55-60%, moderate LVH, grd I DD, mild LA dilation.     Inpatient Medications: Scheduled Meds: . allopurinol  300 mg Oral Daily  . [START ON 01/16/2017] bisoprolol-hydrochlorothiazide  1  tablet Oral q morning - 10a  . citalopram  40 mg Oral QHS  . enoxaparin (LOVENOX) injection  40 mg Subcutaneous Q24H  . gabapentin  300 mg Oral QHS  . [START ON 01/16/2017] lisinopril  20 mg Oral BID  . sodium chloride flush  3 mL Intravenous Q12H   Continuous Infusions: . sodium chloride     PRN Meds: acetaminophen **OR** acetaminophen, aspirin EC, bisacodyl, dextromethorphan-guaiFENesin, HYDROcodone-acetaminophen, levalbuterol, ondansetron **OR** ondansetron (ZOFRAN) IV, senna-docusate, zolpidem  Allergies:    Allergies  Allergen Reactions  . Amitriptyline Hcl     REACTION: nausea, elevated bp  . Ezetimibe     REACTION: chest pain, fatigue    Social History:   Social History   Social History  . Marital status: Married    Spouse name: [name]  . Number of children: N/A  . Years of education: N/A   Occupational History  . [employer]   Social History Main Topics  . Smoking status: Current Every Day Smoker    Packs/day: 1.00    Years: 46.00    Types: Cigarettes    Last attempt to quit: 07/18/2010  . Smokeless tobacco: Never Used  . Alcohol use Yes     Comment: occasional  . Drug use: Yes    Types: Marijuana  . Sexual activity: Not on file   Other Topics Concern  . Not on file   Social History Narrative   ** Merged History Encounter **   Lives in [place] but also enjoys spending time at [place] History:   The patient's family history includes Arthritis in his mother; Bipolar disorder in his father; Heart disease in his father.  ROS:  Please see the history of present illness.  ROS  All other ROS reviewed and negative.     Physical Exam/Data:   Vitals:   01/15/17 0715 01/15/17 0730 01/15/17 0745 01/15/17 0800  BP: 125/88 123/80 (!) 111/92 (!) 116/95  Pulse: (!) 145 (!) 150 (!) 146 (!) 25  Resp: (!) 21 16 20  (!) 21  Temp:      TempSrc:      SpO2: 91% 95% 94% 92%  Weight:      Height:        Intake/Output Summary (Last  24 hours) at 01/15/17 0831 Last data filed at 01/15/17 0723  Gross per 24 hour  Intake              500 ml  Output                0 ml  Net              500 ml   Filed Weights   01/15/17 0633  Weight: 281 lb (127.5 kg)   Body mass index is 39.75 kg/m.  General:  Obese white male  HEENT:  normal Lymph: no adenopathy Neck: no JVD Endocrine:  No thryomegaly Vascular: No carotid bruits; FA pulses 2+ bilaterally without bruits  Cardiac:  normal S1, S2; RRR; no murmur   Lungs:  Rhonchi and exp wheezing  Abd: soft, nontender, no hepatomegaly  Ext: Plus one edema Musculoskeletal:  No deformities, BUE and BLE strength normal and equal Skin: vitiligo areas on arms/chest  Neuro:  CNs 2-12 intact, no focal abnormalities noted Psych:  Normal affect   EKG:  The EKG was personally reviewed and demonstrates:  SR/MAT no acute ST changes  Telemetry:  Telemetry was personally reviewed and demonstrates:  MAT and PAF frequent   Relevant CV Studies: Echo: 01/06/17  EF 55-60% moderate LVH normal RV no pulmonary HTN   Laboratory Data:  Chemistry Recent Labs Lab 01/15/17 0630  NA 137  K 4.2  CL 95*  CO2 25  GLUCOSE 99  BUN 23*  CREATININE 1.17  CALCIUM 9.1  GFRNONAA >60  GFRAA >60  ANIONGAP 17*     Recent Labs Lab 01/15/17 0630  PROT 7.4  ALBUMIN 3.3*  AST 20  ALT 19  ALKPHOS 180*  BILITOT 2.7*   Hematology Recent Labs Lab 01/15/17 0630  WBC 13.3*  RBC 5.34  HGB 15.0  HCT 47.7  MCV 89.3  MCH 28.1  MCHC 31.4  RDW 14.4  PLT 356   Cardiac EnzymesNo results for input(s): TROPONINI in the last 168 hours.  Recent Labs Lab 01/15/17 0639  TROPIPOC 0.02    BNP Recent Labs Lab 01/15/17 0630  BNP 144.7*    DDimer No results for input(s): DDIMER in the last 168 hours.  Radiology/Studies:  Dg Chest Port 1 View  Result Date: 01/15/2017 CLINICAL DATA:  Acute onset of cough and intermittent tachycardia. Atrial fibrillation. Initial encounter. EXAM: PORTABLE CHEST 1  VIEW COMPARISON:  Chest radiograph performed 12/22/2016 FINDINGS: The lungs are well-aerated. Mild bibasilar opacities likely reflect atelectasis. There is no evidence of pleural effusion or pneumothorax. The cardiomediastinal silhouette is within normal limits. No acute osseous abnormalities are seen. IMPRESSION: Mild bibasilar opacities likely reflect atelectasis. Lungs otherwise clear. Electronically Signed   By: Garald Balding M.D.   On: 01/15/2017 06:57    Assessment and Plan:   1. Atrial arrhythmia: to me clearly has PAF in addition to MAT and irritable more automatic foci. Given CAD would need tikosyn and would avoid amiodarone. As discussed by JA smoking mariajuana, obesity and COPD make this rhythm near impossible to Rx and he is asymptomatic with it. Will have EP see but suspect DOAC and tikosyn would be his best option 2. CAD:  Will see what EP thinks but would consider cath before starting DOAC as he had small vessel branch disease in 2008 and has arrhythmias with diaphoresis and may have progressive disease that would impact AAT 3. COPD:  Exacerbation per primary service continue oxygen CXR with atelectasis no frank pneumonia   Signed, Jenkins Rouge, MD  01/15/2017 8:31 AM

## 2017-01-15 NOTE — Telephone Encounter (Signed)
Patient admitted to Hospital.

## 2017-01-15 NOTE — ED Notes (Signed)
Pt again c/o nausea and removes mask  Nasal canula applied at 3l/[place]

## 2017-01-15 NOTE — ED Notes (Signed)
Pt noted as increased diaphoresis and increased work of breathing. Tripod position  Noted. Dr. Aggie Moats called to bedside for eval.

## 2017-01-15 NOTE — ED Provider Notes (Signed)
[facility] DEPT Provider Note   CSN: [id number] Arrival date & time: 01/15/17  6834     History   Chief Complaint Chief Complaint  Patient presents with  . Atrial Fibrillation  . Cough  . Fever    HPI Scott Rollins is a 66 y.o. male.  Patient presents to the ER for evaluation of shortness of breath, chest tightness, palpitations. Patient reports that he has not been feeling well for the last several days. He was recently hospitalized for pneumonia at [facility] in [place]. During that time he had atrial tachycardias that were felt to possibly be atrial fibrillation. He reports that over the last few days he has had increasing shortness of breath, cough productive of greenish sputum, chills, sweats with likely fever. He then developed tachycardia tonight. He is brought to the ER by ambulance. EMS report intermittent normal sinus rhythm followed by irregular narrow complex tachycardia that was suspicious for atrial fibrillation.      Past Medical History:  Diagnosis Date  . Anxiety   . 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 COPD (chronic obstructive pulmonary disease) ([place])    "pearl study" pt thinks he is on Symbicort; pt noncompliant with COPD controller meds on/off due to financial reasons.  . Coronary atherosclerosis of unspecified type of vessel, native or graft 2005   MI, s/p PCI with stent (pt reports 20+ interventions in the past, most recent cat 6/08 showed patent stents).  Normal LV function.  Nuclear stress test NEG 8/09.  . Depression    ?bipolar dx by psychiatrist?  . Diverticulosis 2006  . ED (erectile dysfunction)   . Gouty arthritis    Dr. Amil Amen  . Hematochezia 09/2012   Hyperplastic polyp, diverticulosis, and internal hemorrhoids found on colonoscopy 10/2012  . Hyperlipidemia    Hx of multiple statin intolerance  . Hypertension   . 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  Myocardial infarction ([place])    x 2 or 3  . Neuropathic pain of both feet 2015/2016   Vit B12 and A1c normal 10/2014  . Obesities, morbid ([place])   . OSA on CPAP   . Osteoarthrosis, unspecified whether generalized or localized, unspecified site    DDD  . Prediabetes 2016/17  . Prostate cancer ([place]) 09/2010   Localized, high risk disease (Dr. Kimbrough/Grapey):  s/p 5 wks ext bm rad + seed boost, as well as hormone blockade x 1 yr.  Biochemical recurrence 11/2015--urologist did bone scan and spots on T spine and L iliac region came up, pt needs plain films of pelvis/T spine to see if DJD correlate present.  He'll eventually need to resume androgen depriv therapy.  . Radiation    Pelvic--for prostate ca: 25 treatments/01/2011  . Seizure disorder ([place]) 08-15-2011   Deja vu sensations: no w/u.  These stopped when neurontin 300mg  tid was started for a different reason.  . Status post chemotherapy    10/2010 thru 06/2011    Patient Active Problem List   Diagnosis Date Noted  . Community acquired pneumonia of right lower lobe of lung ([place]) 11/05/2016  . Hyponatremia 11/05/2016  . OSA (obstructive sleep apnea) 11/05/2016  . COPD with acute exacerbation ([place]) 11/04/2016  . 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 COPD (chronic obstructive pulmonary disease) ([place]) 04/19/2014  . Chronic pain syndrome 01/11/2014  . Chronic gout 04/22/2013  . Health maintenance examination 04/29/2012  . Prostate cancer ([place]) 09/12/2010  .  MIXED HYPERLIPIDEMIA 08/27/2010  . ELEVATED PROSTATE SPECIFIC ANTIGEN 08/26/2010  . 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 HEMATURIA, HX OF 08/21/2010  . TOBACCO ABUSE 07/11/2010  . NUMMULAR ECZEMA 06/24/2010  . EMPHYSEMA 04/16/2009  . DYSPNEA 04/10/2009  . OBESITY 04/09/2009  . Essential hypertension 04/09/2009  . Coronary artery disease due to lipid rich plaque 04/09/2009  . DEGENERATIVE JOINT DISEASE 04/09/2009    Past Surgical History:  Procedure Laterality Date  . CARDIAC  CATHETERIZATION  23 caths   pt states he has 6 stents  . COLONOSCOPY WITH PROPOFOL N/A 04/05/2013   Dr. Fuller Plan.  Polypectomy (hyperplastic--recall 10 yrs).  Moderate diverticulosis, +internal hemorrhoids.  No radiation proctitis.  Marland Kitchen HERNIA REPAIR  yrs ago   [employer]  . HOT HEMOSTASIS N/A 04/05/2013   Procedure: HOT HEMOSTASIS (ARGON PLASMA COAGULATION/BICAP);  Surgeon: Ladene Artist, MD;  Location[facility];  Service: Endoscopy;  Laterality: N/A;  . seed implants     02/24/2011/ in prostate  . TRANSTHORACIC ECHOCARDIOGRAM  12/2016   EF 55-60%, moderate LVH, grd I DD, mild LA dilation.       Home Medications    Prior to Admission medications   Medication Sig Start Date End Date Taking? Authorizing Provider  acetaminophen (TYLENOL) 500 MG tablet Take 500 mg by mouth every 6 (six) hours as needed for mild pain.    Yes [provider]  albuterol (PROVENTIL HFA;VENTOLIN HFA) 108 (90 Base) MCG/ACT inhaler Inhale 2 puffs into the lungs every 4 (four) hours as needed for wheezing or shortness of breath.   Yes [provider]  albuterol (PROVENTIL) (2.5 MG/3ML) 0.083% nebulizer solution USE ONE VIAL IN NEBULIZER EVERY 4 HOURS AS NEEDED FOR SHORTNESS OF BREATH OR WHEEZING   Yes [provider]  allopurinol (ZYLOPRIM) 300 MG tablet Take 1 tablet (300 mg total) by mouth daily. 12/17/16  Yes [name], [name], MD  aspirin 325 MG tablet Take 325 mg by mouth daily.     Yes [provider]  aspirin EC 81 MG tablet Take 162 mg by mouth daily as needed (pain).   Yes [provider]  bisoprolol-hydrochlorothiazide (ZIAC) 5-6.25 MG tablet Take 1 tablet by mouth every morning. 12/17/16  Yes [name], [name], MD  citalopram (CELEXA) 40 MG tablet Take 1 tablet (40 mg total) by mouth at bedtime. 12/17/16  Yes [name], [name], MD  cyclobenzaprine (FLEXERIL) 10 MG tablet Take 1 tablet (10 mg total) by mouth at bedtime. 12/17/16  Yes [name], [name], MD    dextromethorphan-guaiFENesin [facility] DM) 30-600 MG 12hr tablet Take 1 tablet by mouth 2 (two) times daily as needed for cough.   Yes [provider]  furosemide (LASIX) 40 MG tablet Take 2 tablets (80 mg total) by mouth daily as needed. Patient taking differently: Take 80 mg by mouth daily as needed for fluid.  12/10/16  Yes [name], [name], MD  gabapentin (NEURONTIN) 300 MG capsule Take 300 mg by mouth at bedtime.    Yes [provider]  guaiFENesin-dextromethorphan (ROBITUSSIN DM) 100-10 MG/5ML syrup Take 5 mLs by mouth every 4 (four) hours as needed for cough. 11/07/16  Yes Nita Sells, MD  lisinopril (PRINIVIL,ZESTRIL) 20 MG tablet TAKE ONE TABLET BY MOUTH TWICE DAILY Patient taking differently: TAKE ONE TABLET BY MOUTH DAILY 12/31/16  Yes [name], [name], MD  naproxen (NAPROSYN) 500 MG tablet Take 500 mg by mouth 2 (two) times daily as needed for mild pain.    Yes [provider]  Oxycodone HCl 10 MG TABS 1 tab po tid x 10d, then 1 tab po bid x 10d, then 1 tab po qd x 10, then 1/2 tab po qd x 10 days, then stop. Patient taking differently: Take 10 mg by mouth daily as needed (pain).  12/23/16  Yes [name], [name], MD  Probiotic Product (PROBIOTIC PO) Take 1 capsule by mouth daily.   Yes [provider]  zolpidem (AMBIEN) 10 MG tablet Take 1 tablet (10 mg total) by mouth at bedtime as needed for sleep. 12/17/16  Yes [name], [name], MD  budesonide-formoterol [facility]) 160-4.5 MCG/ACT inhaler Inhale 2 puffs into the lungs 2 (two) times daily. Patient not taking: Reported on 01/15/2017 01/01/17   [name], MD    Family History Family History  Problem Relation Age of Onset  . Arthritis Mother   . Heart disease Father   . Bipolar disorder Father     Social History Social History  Substance Use Topics  . Smoking status: Current Every Day Smoker    Packs/day: 1.00    Years: 46.00    Types: Cigarettes    Last attempt to quit:  07/18/2010  . Smokeless tobacco: Never Used  . Alcohol use Yes     Comment: occasional     Allergies   Amitriptyline hcl and Ezetimibe   Review of Systems Review of Systems  Constitutional: Positive for chills, diaphoresis and fever.  Respiratory: Positive for cough, chest tightness, shortness of breath and wheezing.   Cardiovascular: Positive for chest pain and palpitations.  All other systems reviewed and are negative.    Physical Exam Updated Vital Signs BP 106/74 (BP Location: Right Arm)   Pulse 94   Temp 98.1 F (36.7 C) (Axillary)   Resp (!) 25   Ht 5' 10.5" (1.791 m)   Wt 127.5 kg (281 lb)   SpO2 97%   BMI 39.75 kg/m   Physical Exam  Constitutional: He is oriented to person, place, and time. He appears well-developed and well-nourished. No distress.  HENT:  Head: Normocephalic and atraumatic.  Right Ear: Hearing normal.  Left Ear: Hearing normal.  Nose: Nose normal.  Mouth/Throat: Oropharynx is clear and moist and mucous membranes are normal.  Eyes: Conjunctivae and EOM are normal. Pupils are equal, round, and reactive to light.  Neck: Normal range of motion. Neck supple.  Cardiovascular: Regular rhythm, S1 normal and S2 normal.  Exam reveals no gallop and no friction rub.   No murmur heard. Pulmonary/Chest: Effort normal and breath sounds normal. No respiratory distress. He exhibits no tenderness.  Abdominal: Soft. Normal appearance and bowel sounds are normal. There is no hepatosplenomegaly. There is no tenderness. There is no rebound, no guarding, no tenderness at McBurney's point and negative Murphy's sign. No hernia.  Musculoskeletal: Normal range of motion.  Neurological: He is alert and oriented to person, place, and time. He has normal strength. No cranial nerve deficit or sensory deficit. Coordination normal. GCS eye subscore is 4. GCS verbal subscore is 5. GCS motor subscore is 6.  Skin: Skin is warm, dry and intact. No rash noted. No cyanosis.    Psychiatric: He has a normal mood and affect. His speech is normal and behavior is normal. Thought content normal.  Nursing note and vitals reviewed.    ED Treatments / Results  Labs (all labs ordered are listed, but only abnormal results are displayed) Labs Reviewed  CBC WITH DIFFERENTIAL/PLATELET - Abnormal; Notable for the following:  Result Value   WBC 13.3 (*)    Neutro Abs 10.5 (*)    Monocytes Absolute 1.1 (*)    All other components within normal limits  COMPREHENSIVE METABOLIC PANEL - Abnormal; Notable for the following:    Chloride 95 (*)    BUN 23 (*)    Albumin 3.3 (*)    Alkaline Phosphatase 180 (*)    Total Bilirubin 2.7 (*)    Anion gap 17 (*)    All other components within normal limits  BRAIN NATRIURETIC PEPTIDE - Abnormal; Notable for the following:    B Natriuretic Peptide 144.7 (*)    All other components within normal limits  I-STAT CG4 LACTIC ACID, ED - Abnormal; Notable for the following:    Lactic Acid, Venous 2.29 (*)    All other components within normal limits  PROTIME-INR  I-STAT TROPOININ, ED    EKG  EKG Interpretation  Date/Time:  Thursday January 15 2017 06:13:27 EDT Ventricular Rate:  162 PR Interval:    QRS Duration: 91 QT Interval:  321 QTC Calculation: 527 R Axis:   74 Text Interpretation:  Atrial fibrillation with rapid V-rate Confirmed by Orpah Greek [phone]) on 01/15/2017 6:18:19 AM       EKG Interpretation  Date/Time:  Thursday January 15 2017 06:23:27 EDT Ventricular Rate:  155 PR Interval:    QRS Duration: 95 QT Interval:  323 QTC Calculation: 527 R Axis:   78 Text Interpretation:  Atrial fibrillation with rapid V-rate Confirmed by Orpah Greek [phone]) on 01/15/2017 6:39:12 AM       EKG Interpretation  Date/Time:  Thursday January 15 2017 06:23:27 EDT Ventricular Rate:  155 PR Interval:    QRS Duration: 95 QT Interval:  323 QTC Calculation: 527 R Axis:   78 Text Interpretation:  Atrial  fibrillation with rapid V-rate Confirmed by Orpah Greek [phone]) on 01/15/2017 6:39:12 AM        Radiology Dg Chest Port 1 View  Result Date: 01/15/2017 CLINICAL DATA:  Acute onset of cough and intermittent tachycardia. Atrial fibrillation. Initial encounter. EXAM: PORTABLE CHEST 1 VIEW COMPARISON:  Chest radiograph performed 12/22/2016 FINDINGS: The lungs are well-aerated. Mild bibasilar opacities likely reflect atelectasis. There is no evidence of pleural effusion or pneumothorax. The cardiomediastinal silhouette is within normal limits. No acute osseous abnormalities are seen. IMPRESSION: Mild bibasilar opacities likely reflect atelectasis. Lungs otherwise clear. Electronically Signed   By: Garald Balding M.D.   On: 01/15/2017 06:57    Procedures Procedures (including critical care time)  Medications Ordered in ED Medications  metoprolol tartrate (LOPRESSOR) injection 5 mg (not administered)  sodium chloride 0.9 % bolus 500 mL (0 mLs Intravenous Stopped 01/15/17 0723)     Initial Impression / Assessment and Plan / ED Course  I have reviewed the triage vital signs and the nursing notes.  Pertinent labs & imaging results that were available during my care of the patient were reviewed by me and considered in my medical decision making (see chart for details).     Patient presents to the emergency department for evaluation of cough, congestion, shortness of breath. He has a history of COPD and recent pneumonia. He also has a history of atrial tachycardias. Reviewing the records from Dr. Rayann Heman reveal that he has had predominantly multifocal atrial tachycardia in the past. Multiple tracings have been performed in the ER at 2 seem consistent with atrial fibrillation (no p waves to suggest MAT). Patient was borderline hypotensive at arrival. Was  administered IV fluids. Most recent echo from last month revealed normal left ventricular function. Blood pressure improved with fluids,  administered Lopressor IV.  Discussed with Dr. Johnsie Cancel, on call for cardiology. With the patient's COPD and other symptoms, asked for medicine admission, cardiology will consult.  Final Clinical Impressions(s) / ED Diagnoses   Final diagnoses:  Chronic obstructive pulmonary disease with acute exacerbation (HCC)  Atrial fibrillation with RVR [facility])    New Prescriptions New Prescriptions   No medications on file     Orpah Greek, MD 01/15/17 [phone]

## 2017-01-15 NOTE — ED Notes (Addendum)
Pt statse he feels he is gasping for air. Respiratory called. Cipap replaced after pt denies nausea. Will inform PA.

## 2017-01-15 NOTE — ED Notes (Signed)
Admitting Provider at the bedside.  

## 2017-01-15 NOTE — Progress Notes (Signed)
ANTICOAGULATION CONSULT NOTE - Initial Consult  Pharmacy Consult for Enoxaparin Indication: atrial fibrillation  Allergies  Allergen Reactions  . Amitriptyline Hcl     REACTION: nausea, elevated bp  . Ezetimibe     REACTION: chest pain, fatigue    Patient Measurements: Height: 5' 10.5" (179.1 cm) Weight: 281 lb (127.5 kg) IBW/kg (Calculated) : 74.15  Assessment: 66 yo M presents on 7/5 with Afib and cough. No anticoag PTA. CBC stable.  Goal of Therapy:  Monitor platelets by anticoagulation protocol: Yes   Plan: Start enoxaparin 130mg  [place] Q12h Monitor CBC, s/s of bleed  Elenor Quinones, PharmD, [facility] Clinical Pharmacist Pager [phone] 01/15/2017 8:34 AM

## 2017-01-15 NOTE — Consult Note (Signed)
ELECTROPHYSIOLOGY CONSULT NOTE    Patient ID: Scott Rollins MRN: [id number], DOB/AGE: [birth year] 65 y.o.  Admit date: 01/15/2017 Date of Consult: 01/15/2017  Primary Physician: [name], MD Primary Cardiologist: Harrington Challenger Electrophysiologist: Monta Police  Reason for Consultation: AT/AF  HPI:  Scott Rollins is a 66 y.o. male is referred by Dr Johnsie Cancel for evaluation of MAT/AF.  Past medical history is significant for CAD, severe COPD, OSA, tobacco abuse, and marijuana abuse.  He was seen by [name] in the office 12/2016 for evaluation of atrial arrhythmias. At that time, he was felt to be a poor candidate for AAD therapy or OAC 2/2 non compliance.  He presented to the ER earlier today with complaints of cough, congestion, and increased shortness of breath. He was found to be in intermittent AF with RVR and placed on cardizem drip.  Labs are notable for lactate of 2.29 and WBC of 13.3.  He was placed on Bipap with improvement in respiratory status as well as resolution of atrial arrhythmias.  Echo 01/06/17 demonstrated EF 55-60%, no RMWA, grade 1 diastolic dysfunction, LA 40.  He currently states that he is feeling improved. No chest pain. He is relatively asymptomatic with atrial arrhythmias. He has not had recent fevers, chills, nausea or vomiting.    Past Medical History:  Diagnosis Date  . Anxiety   . 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 COPD (chronic obstructive pulmonary disease) ([place])    "pearl study" pt thinks he is on Symbicort; pt noncompliant with COPD controller meds on/off due to financial reasons.  . Coronary atherosclerosis of unspecified type of vessel, native or graft 2005   MI, s/p PCI with stent (pt reports 20+ interventions in the past, most recent cat 6/08 showed patent stents).  Normal LV function.  Nuclear stress test NEG 8/09.  . Depression    ?bipolar dx by psychiatrist?  . Diverticulosis 2006  . ED (erectile  dysfunction)   . Gouty arthritis    Dr. Amil Amen  . Hematochezia 09/2012   Hyperplastic polyp, diverticulosis, and internal hemorrhoids found on colonoscopy 10/2012  . Hyperlipidemia    Hx of multiple statin intolerance  . Hypertension   . 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 Myocardial infarction ([place])    x 2 or 3  . Neuropathic pain of both feet 2015/2016   Vit B12 and A1c normal 10/2014  . Obesities, morbid ([place])   . OSA on CPAP   . Osteoarthrosis, unspecified whether generalized or localized, unspecified site    DDD  . Prediabetes 2016/17  . Prostate cancer ([place]) 09/2010   Localized, high risk disease (Dr. Kimbrough/Grapey):  s/p 5 wks ext bm rad + seed boost, as well as hormone blockade x 1 yr.  Biochemical recurrence 11/2015--urologist did bone scan and spots on T spine and L iliac region came up, pt needs plain films of pelvis/T spine to see if DJD correlate present.  He'll eventually need to resume androgen depriv therapy.  . Radiation    Pelvic--for prostate ca: 25 treatments/01/2011  . Seizure disorder ([place]) 08-15-2011   Deja vu sensations: no w/u.  These stopped when neurontin 300mg  tid was started for a different reason.  . Status post chemotherapy    10/2010 thru 06/2011     Surgical History:  Past Surgical History:  Procedure Laterality Date  . CARDIAC CATHETERIZATION  23 caths   pt states he has 6 stents  .  COLONOSCOPY WITH PROPOFOL N/A 04/05/2013   Dr. Fuller Plan.  Polypectomy (hyperplastic--recall 10 yrs).  Moderate diverticulosis, +internal hemorrhoids.  No radiation proctitis.  Marland Kitchen HERNIA REPAIR  yrs ago   [employer]  . HOT HEMOSTASIS N/A 04/05/2013   Procedure: HOT HEMOSTASIS (ARGON PLASMA COAGULATION/BICAP);  Surgeon: Ladene Artist, MD;  Location: [facility];  Service: Endoscopy;  Laterality: N/A;  . seed implants     02/24/2011/ in prostate  . TRANSTHORACIC ECHOCARDIOGRAM  12/2016   EF 55-60%, moderate LVH, grd I DD, mild LA dilation.     Prescriptions  Prior to Admission  Medication Sig Dispense Refill Last Dose  . acetaminophen (TYLENOL) 500 MG tablet Take 500 mg by mouth every 6 (six) hours as needed for mild pain.    01/15/2017 at Unknown time  . albuterol (PROVENTIL HFA;VENTOLIN HFA) 108 (90 Base) MCG/ACT inhaler Inhale 2 puffs into the lungs every 4 (four) hours as needed for wheezing or shortness of breath.   01/15/2017 at Unknown time  . albuterol (PROVENTIL) (2.5 MG/3ML) 0.083% nebulizer solution USE ONE VIAL IN NEBULIZER EVERY 4 HOURS AS NEEDED FOR SHORTNESS OF BREATH OR WHEEZING   unk  . allopurinol (ZYLOPRIM) 300 MG tablet Take 1 tablet (300 mg total) by mouth daily. 90 tablet 3 Past Week at Unknown time  . aspirin 325 MG tablet Take 325 mg by mouth daily.     01/15/2017 at 0100  . aspirin EC 81 MG tablet Take 162 mg by mouth daily as needed (pain).   unk  . bisoprolol-hydrochlorothiazide (ZIAC) 5-6.25 MG tablet Take 1 tablet by mouth every morning. 90 tablet 3 01/14/2017 at 1000  . citalopram (CELEXA) 40 MG tablet Take 1 tablet (40 mg total) by mouth at bedtime. 90 tablet 3 Past Week at Unknown time  . cyclobenzaprine (FLEXERIL) 10 MG tablet Take 1 tablet (10 mg total) by mouth at bedtime. 30 tablet 5 Past Week at Unknown time  . dextromethorphan-guaiFENesin (MUCINEX DM) 30-600 MG 12hr tablet Take 1 tablet by mouth 2 (two) times daily as needed for cough.   01/15/2017 at Unknown time  . furosemide (LASIX) 40 MG tablet Take 2 tablets (80 mg total) by mouth daily as needed. (Patient taking differently: Take 80 mg by mouth daily as needed for fluid. ) 180 tablet 1 unk  . gabapentin (NEURONTIN) 300 MG capsule Take 300 mg by mouth at bedtime.    Past Week at Unknown time  . guaiFENesin-dextromethorphan (ROBITUSSIN DM) 100-10 MG/5ML syrup Take 5 mLs by mouth every 4 (four) hours as needed for cough. 118 mL 0 unk  . lisinopril (PRINIVIL,ZESTRIL) 20 MG tablet TAKE ONE TABLET BY MOUTH TWICE DAILY (Patient taking differently: TAKE ONE TABLET BY MOUTH DAILY)  90 tablet 7 01/14/2017 at Unknown time  . naproxen (NAPROSYN) 500 MG tablet Take 500 mg by mouth 2 (two) times daily as needed for mild pain.    unk  . Oxycodone HCl 10 MG TABS 1 tab po tid x 10d, then 1 tab po bid x 10d, then 1 tab po qd x 10, then 1/2 tab po qd x 10 days, then stop. (Patient taking differently: Take 10 mg by mouth daily as needed (pain). ) 65 tablet 0 unk  . Probiotic Product (PROBIOTIC PO) Take 1 capsule by mouth daily.   Past Week at Unknown time  . zolpidem (AMBIEN) 10 MG tablet Take 1 tablet (10 mg total) by mouth at bedtime as needed for sleep. 90 tablet 1 unk  . budesonide-formoterol (SYMBICORT)  160-4.5 MCG/ACT inhaler Inhale 2 puffs into the lungs 2 (two) times daily. (Patient not taking: Reported on 01/15/2017) 1 Inhaler 12 Not Taking at Unknown time    Inpatient Medications: . allopurinol  300 mg Oral Daily  . [START ON 01/16/2017] bisoprolol-hydrochlorothiazide  1 tablet Oral q morning - 10a  . citalopram  40 mg Oral QHS  . enoxaparin (LOVENOX) injection  1 mg/kg Subcutaneous Q12H  . gabapentin  300 mg Oral QHS  . [START ON 01/16/2017] lisinopril  20 mg Oral BID  . sodium chloride flush  3 mL Intravenous Q12H    Allergies:  Allergies  Allergen Reactions  . Amitriptyline Hcl     REACTION: nausea, elevated bp  . Ezetimibe     REACTION: chest pain, fatigue    Social History   Social History  . Marital status: Married    Spouse name: [name]  . Number of children: N/A  . Years of education: N/A   Occupational History  . [employer]   Social History Main Topics  . Smoking status: Current Every Day Smoker    Packs/day: 1.00    Years: 46.00    Types: Cigarettes    Last attempt to quit: 07/18/2010  . Smokeless tobacco: Never Used  . Alcohol use Yes     Comment: occasional  . Drug use: Yes    Types: Marijuana  . Sexual activity: Not on file   Other Topics Concern  . Not on file   Social History Narrative   ** Merged History Encounter **   Lives  in [place] but also enjoys spending time at [place] History  Problem Relation Age of Onset  . Arthritis Mother   . Heart disease Father   . Bipolar disorder Father      Review of Systems: All other systems reviewed and are otherwise negative except as noted above.  Physical Exam: Vitals:   01/15/17 1500 01/15/17 1515 01/15/17 1530 01/15/17 1601  BP: (!) 144/82 132/62 136/84 (!) 148/60  Pulse: 87 85 88 88  Resp: (!) 24 (!) 26 18 19   Temp:   (!) 97.5 F (36.4 C) 98.5 F (36.9 C)  TempSrc:   Oral Oral  SpO2: 95% 93% 94% 93%  Weight:      Height:        GEN- The patient is ill appearing, alert and oriented x 3 today, +Bipap.   HEENT: normocephalic, atraumatic; sclera clear, conjunctiva pink; hearing intact; oropharynx clear; neck supple  Lungs- Clear to ausculation bilaterally, normal work of breathing.  No wheezes, rales, rhonchi Heart- Tachycardic regular rate and rhythm  GI- soft, non-tender, non-distended, bowel sounds present  Extremities- no clubbing, cyanosis, or edema  MS- no significant deformity or atrophy Skin- warm and dry, no rash or lesion Psych- euthymic mood, full affect Neuro- strength and sensation are intact  Labs:  Lab Results  Component Value Date   WBC 13.3 (H) 01/15/2017   HGB 15.0 01/15/2017   HCT 47.7 01/15/2017   MCV 89.3 01/15/2017   PLT 356 01/15/2017    Recent Labs Lab 01/15/17 0630  NA 137  K 4.2  CL 95*  CO2 25  BUN 23*  CREATININE 1.17  CALCIUM 9.1  PROT 7.4  BILITOT 2.7*  ALKPHOS 180*  ALT 19  AST 20  GLUCOSE 99      Radiology/Studies: Dg Chest 2 View Result Date: 12/23/2016 CLINICAL DATA:  Upper back pain  history of pneumonia EXAM: CHEST  2 VIEW COMPARISON:  11/04/2016 FINDINGS: Hyperinflation. No focal consolidation or pleural effusion. Mild cardiomegaly. No pneumothorax. IMPRESSION: No active cardiopulmonary disease.  Mild cardiomegaly Electronically Signed   By: Donavan Foil M.D.   On:  12/23/2016 00:19   EKG:MAT, rate 162 (personally reviewed)  TELEMETRY: MAT with conversion to SR  (personally reviewed)  Assessment/Plan: 1.  Atrial arrhythmias The patient presents with recurrent asymptomatic atrial arrhythmias in the setting of COPD exacerbation. He has improved with Bipap.  I think his pulmonary status is driving his atrial arrhythmias. He does clearly have AF/flutter this admission (previously all MAT) CHADS2VASC is at least 2.  With non compliance, he is a poor candidate for anticoagulation. [name] to discuss further with patient today. For now, would continue cardizem (convert to po) As per Dr Jackalyn Lombard last note, he is a poor candidate for AAD therapy  2.  Morbid obesity Body mass index is 38.57 kg/m. Weight loss advised  3.  HTN Stable No change required today  4.  OSA Compliance with CPAP encouraged  5.  Tobacco/marijuana abuse Cessation advised  6.  COPD exacerbation Per primary team   [name] to see later today  Signed, Chanetta Marshall, NP 01/15/2017 4:27 PM   I have seen, examined the patient, and reviewed the above assessment and plan.  On exam, RRR. Clinically improved. Changes to above are made where necessary.  Pt with multiple atrial arrhythmias secondary to exacerbation of chronic lung disease. He is much better s/p BiPAP and has with this converted to sinus rhythm.  Poor candidate for antiarrhythmics presently.  I would advise treatment of underlying lung disease as you are doing and converting IV cardizem to oral.  Consider anticoagulation if he can demonstrate interests in compliance.  Follow-up in the AF clinic after discharge.  Electrophysiology team to see as needed while here. Please call with questions.   Co Sign: Thompson Grayer, MD 01/15/2017 5:20 PM

## 2017-01-15 NOTE — ED Notes (Signed)
NSR rhythm noted and pt states he feels somewhat better. Repeat EKG done for rhythm change.

## 2017-01-15 NOTE — ED Notes (Signed)
IV attempted x2 without success.

## 2017-01-16 ENCOUNTER — Telehealth: Payer: Self-pay | Admitting: *Deleted

## 2017-01-16 ENCOUNTER — Other Ambulatory Visit: Payer: Self-pay | Admitting: Pharmacist

## 2017-01-16 ENCOUNTER — Observation Stay (HOSPITAL_COMMUNITY): Payer: Medicare Other

## 2017-01-16 DIAGNOSIS — I252 Old myocardial infarction: Secondary | ICD-10-CM | POA: Diagnosis not present

## 2017-01-16 DIAGNOSIS — F17213 Nicotine dependence, cigarettes, with withdrawal: Secondary | ICD-10-CM | POA: Diagnosis present

## 2017-01-16 DIAGNOSIS — I110 Hypertensive heart disease with heart failure: Secondary | ICD-10-CM | POA: Diagnosis present

## 2017-01-16 DIAGNOSIS — J9811 Atelectasis: Secondary | ICD-10-CM | POA: Diagnosis present

## 2017-01-16 DIAGNOSIS — I10 Essential (primary) hypertension: Secondary | ICD-10-CM | POA: Diagnosis not present

## 2017-01-16 DIAGNOSIS — Z7982 Long term (current) use of aspirin: Secondary | ICD-10-CM | POA: Diagnosis not present

## 2017-01-16 DIAGNOSIS — M1A9XX0 Chronic gout, unspecified, without tophus (tophi): Secondary | ICD-10-CM | POA: Diagnosis present

## 2017-01-16 DIAGNOSIS — G4733 Obstructive sleep apnea (adult) (pediatric): Secondary | ICD-10-CM

## 2017-01-16 DIAGNOSIS — I2583 Coronary atherosclerosis due to lipid rich plaque: Secondary | ICD-10-CM | POA: Diagnosis present

## 2017-01-16 DIAGNOSIS — R972 Elevated prostate specific antigen [PSA]: Secondary | ICD-10-CM | POA: Diagnosis present

## 2017-01-16 DIAGNOSIS — Z79899 Other long term (current) drug therapy: Secondary | ICD-10-CM | POA: Diagnosis not present

## 2017-01-16 DIAGNOSIS — R05 Cough: Secondary | ICD-10-CM | POA: Diagnosis not present

## 2017-01-16 DIAGNOSIS — F17200 Nicotine dependence, unspecified, uncomplicated: Secondary | ICD-10-CM | POA: Diagnosis not present

## 2017-01-16 DIAGNOSIS — Z888 Allergy status to other drugs, medicaments and biological substances status: Secondary | ICD-10-CM | POA: Diagnosis not present

## 2017-01-16 DIAGNOSIS — Z955 Presence of coronary angioplasty implant and graft: Secondary | ICD-10-CM | POA: Diagnosis not present

## 2017-01-16 DIAGNOSIS — J441 Chronic obstructive pulmonary disease with (acute) exacerbation: Principal | ICD-10-CM

## 2017-01-16 DIAGNOSIS — J9601 Acute respiratory failure with hypoxia: Secondary | ICD-10-CM | POA: Diagnosis present

## 2017-01-16 DIAGNOSIS — I4891 Unspecified atrial fibrillation: Secondary | ICD-10-CM | POA: Diagnosis not present

## 2017-01-16 DIAGNOSIS — I480 Paroxysmal atrial fibrillation: Secondary | ICD-10-CM | POA: Diagnosis not present

## 2017-01-16 DIAGNOSIS — I248 Other forms of acute ischemic heart disease: Secondary | ICD-10-CM | POA: Diagnosis present

## 2017-01-16 DIAGNOSIS — G629 Polyneuropathy, unspecified: Secondary | ICD-10-CM | POA: Diagnosis present

## 2017-01-16 DIAGNOSIS — N179 Acute kidney failure, unspecified: Secondary | ICD-10-CM | POA: Diagnosis present

## 2017-01-16 DIAGNOSIS — F329 Major depressive disorder, single episode, unspecified: Secondary | ICD-10-CM | POA: Diagnosis present

## 2017-01-16 DIAGNOSIS — I5033 Acute on chronic diastolic (congestive) heart failure: Secondary | ICD-10-CM | POA: Diagnosis present

## 2017-01-16 DIAGNOSIS — J449 Chronic obstructive pulmonary disease, unspecified: Secondary | ICD-10-CM | POA: Diagnosis not present

## 2017-01-16 DIAGNOSIS — J438 Other emphysema: Secondary | ICD-10-CM | POA: Diagnosis not present

## 2017-01-16 DIAGNOSIS — R7303 Prediabetes: Secondary | ICD-10-CM | POA: Diagnosis present

## 2017-01-16 DIAGNOSIS — Z9119 Patient's noncompliance with other medical treatment and regimen: Secondary | ICD-10-CM | POA: Diagnosis not present

## 2017-01-16 DIAGNOSIS — Z8546 Personal history of malignant neoplasm of prostate: Secondary | ICD-10-CM | POA: Diagnosis not present

## 2017-01-16 DIAGNOSIS — R0602 Shortness of breath: Secondary | ICD-10-CM

## 2017-01-16 DIAGNOSIS — G894 Chronic pain syndrome: Secondary | ICD-10-CM | POA: Diagnosis not present

## 2017-01-16 DIAGNOSIS — I2510 Atherosclerotic heart disease of native coronary artery without angina pectoris: Secondary | ICD-10-CM | POA: Diagnosis present

## 2017-01-16 LAB — HEPATITIS PANEL, ACUTE
HCV Ab: <0.1 s/co ratio (ref 0.0–0.9)
HEP B C IGM: NEGATIVE
HEP B S AG: NEGATIVE
Hep A IgM: NEGATIVE

## 2017-01-16 LAB — BASIC METABOLIC PANEL
Anion gap: 11 (ref 5–15)
BUN: 31 mg/dL — AB (ref 6–20)
CO2: 29 mmol/L (ref 22–32)
Calcium: 8.8 mg/dL — ABNORMAL LOW (ref 8.9–10.3)
Chloride: 96 mmol/L — ABNORMAL LOW (ref 101–111)
Creatinine, Ser: 1.25 mg/dL — ABNORMAL HIGH (ref 0.61–1.24)
GFR calc Af Amer: >60 mL/min (ref >60)
GFR, EST NON AFRICAN AMERICAN: 58 mL/min — AB (ref >60)
GLUCOSE: 108 mg/dL — AB (ref 65–99)
POTASSIUM: 4 mmol/L (ref 3.5–5.1)
Sodium: 136 mmol/L (ref 135–145)

## 2017-01-16 LAB — RESPIRATORY PANEL BY PCR
Adenovirus: NOT DETECTED
BORDETELLA PERTUSSIS-RVPCR: NOT DETECTED
CHLAMYDOPHILA PNEUMONIAE-RVPPCR: NOT DETECTED
CORONAVIRUS HKU1-RVPPCR: NOT DETECTED
Coronavirus 229E: NOT DETECTED
Coronavirus NL63: NOT DETECTED
Coronavirus OC43: NOT DETECTED
INFLUENZA B-RVPPCR: NOT DETECTED
Influenza A: NOT DETECTED
METAPNEUMOVIRUS-RVPPCR: NOT DETECTED
Mycoplasma pneumoniae: NOT DETECTED
PARAINFLUENZA VIRUS 3-RVPPCR: NOT DETECTED
Parainfluenza Virus 1: NOT DETECTED
Parainfluenza Virus 2: NOT DETECTED
Parainfluenza Virus 4: NOT DETECTED
RESPIRATORY SYNCYTIAL VIRUS-RVPPCR: NOT DETECTED
RHINOVIRUS / ENTEROVIRUS - RVPPCR: NOT DETECTED

## 2017-01-16 LAB — LACTIC ACID, PLASMA
LACTIC ACID, VENOUS: 1 mmol/L (ref 0.5–1.9)
LACTIC ACID, VENOUS: 1.3 mmol/L (ref 0.5–1.9)
LACTIC ACID, VENOUS: 3 mmol/L — AB (ref 0.5–1.9)
Lactic Acid, Venous: 0.9 mmol/L (ref 0.5–1.9)
Lactic Acid, Venous: 0.7 mmol/L (ref 0.5–1.9)
Lactic Acid, Venous: 0.7 mmol/L (ref 0.5–1.9)
Lactic Acid, Venous: 1 mmol/L (ref 0.5–1.9)
Lactic Acid, Venous: 1.6 mmol/L (ref 0.5–1.9)

## 2017-01-16 LAB — CBC
HEMATOCRIT: 43.8 % (ref 39.0–52.0)
Hemoglobin: 13.7 g/dL (ref 13.0–17.0)
MCH: 28.1 pg (ref 26.0–34.0)
MCHC: 31.3 g/dL (ref 30.0–36.0)
MCV: 89.9 fL (ref 78.0–100.0)
Platelets: 324 10*3/uL (ref 150–400)
RBC: 4.87 MIL/uL (ref 4.22–5.81)
RDW: 14.6 % (ref 11.5–15.5)
WBC: 10.3 10*3/uL (ref 4.0–10.5)

## 2017-01-16 LAB — HEMOGLOBIN A1C
Hgb A1c MFr Bld: 5.3 % (ref 4.8–5.6)
MEAN PLASMA GLUCOSE: 105 mg/dL

## 2017-01-16 LAB — URINE CULTURE

## 2017-01-16 LAB — TROPONIN I: Troponin I: 0.03 ng/mL (ref <0.03)

## 2017-01-16 LAB — CALCIUM, IONIZED: CALCIUM, IONIZED, SERUM: 4.5 mg/dL (ref 4.5–5.6)

## 2017-01-16 LAB — HIV ANTIBODY (ROUTINE TESTING W REFLEX): HIV Screen 4th Generation wRfx: NONREACTIVE

## 2017-01-16 MED ORDER — BUDESONIDE 0.25 MG/2ML IN SUSP
0.2500 mg | Freq: Two times a day (BID) | RESPIRATORY_TRACT | Status: DC
  Administered 2017-01-16 – 2017-01-17 (×3): 0.25 mg via RESPIRATORY_TRACT
  Filled 2017-01-16 (×3): qty 2

## 2017-01-16 MED ORDER — HYDRALAZINE HCL 20 MG/ML IJ SOLN
5.0000 mg | Freq: Four times a day (QID) | INTRAMUSCULAR | Status: DC | PRN
  Administered 2017-01-16: 5 mg via INTRAVENOUS
  Filled 2017-01-16: qty 1

## 2017-01-16 MED ORDER — METHYLPREDNISOLONE SODIUM SUCC 40 MG IJ SOLR
40.0000 mg | Freq: Two times a day (BID) | INTRAMUSCULAR | Status: DC
  Administered 2017-01-16 (×2): 40 mg via INTRAVENOUS
  Filled 2017-01-16 (×3): qty 1

## 2017-01-16 MED ORDER — ARFORMOTEROL TARTRATE 15 MCG/2ML IN NEBU
15.0000 ug | INHALATION_SOLUTION | Freq: Two times a day (BID) | RESPIRATORY_TRACT | Status: DC
  Administered 2017-01-16 (×2): 15 ug via RESPIRATORY_TRACT
  Filled 2017-01-16 (×2): qty 2

## 2017-01-16 NOTE — Progress Notes (Signed)
ANTICOAGULATION CONSULT NOTE - Follow Up Consult  Pharmacy Consult for Enoxaparin Indication: atrial fibrillation  Allergies  Allergen Reactions  . Amitriptyline Hcl     REACTION: nausea, elevated bp  . Ezetimibe     REACTION: chest pain, fatigue    Patient Measurements: Height: 5\' 10"  (177.8 cm) Weight: 270 lb 3.2 oz (122.6 kg) IBW/kg (Calculated) : 73  Assessment: 66 yo M presents on 7/5 with Afib. CHADS2VA2Sc = 2 (vasc disease, age).  No anticoag PTA.  Per cards notes,  plan is to avoid [facility] long term given h/o noncompliance.   CBC stable wnl, no bleeding noted.  Goal of Therapy:  Monitor platelets by anticoagulation protocol: Yes   Plan: Continue enoxaparin 130mg  (~1 mg/kg) subcut Q12h Monitor CBC, s/sx of bleed  Carlean Jews, Pharm.D. PGY2 Pharmacy Resident 01/16/2017 1:07 PM Main Pharmacy: [phone]

## 2017-01-16 NOTE — Progress Notes (Signed)
Pharmacy Antibiotic Note  Scott Rollins is a 66 y.o. male admitted on 01/15/2017 with Afib and cough. Found to have COPD exacerbation. Pharmacy has been consulted for ceftriaxone dosing. Of note, patient also continues on azithromycin per MD.   Courtney Heys D#2 ABX for COPD exacerbation. WBC improved to 10.3, LA <2, Afebrile.   Plan: Start ceftriaxone 1g IV Q24h Monitor clinical picture, renal function F/U C&S, abx deescalation / LOT Consider transition to PO soon  Height: 5\' 10"  (177.8 cm) Weight: 270 lb 3.2 oz (122.6 kg) IBW/kg (Calculated) : 73  Temp (24hrs), Avg:98.7 F (37.1 C), Min:97.5 F (36.4 C), Max:99.5 F (37.5 C)   Recent Labs Lab 01/15/17 0630  01/15/17 2111 01/16/17 0018 01/16/17 0328 01/16/17 0655 01/16/17 0924  WBC 13.3*  --   --   --  10.3  --   --   CREATININE 1.17  --   --   --  1.25*  --   --   LATICACIDVEN  --   < > 1.5 0.9 1.0 0.7 1.3  < > = values in this interval not displayed.  Estimated Creatinine Clearance: 75.3 mL/min (A) (by C-G formula based on SCr of 1.25 mg/dL (H)).    Allergies  Allergen Reactions  . Amitriptyline Hcl     REACTION: nausea, elevated bp  . Ezetimibe     REACTION: chest pain, fatigue    Antimicrobials this admission:  7/5 CTX>>  7/5 azithro >>  Microbiology results:  7/5 BCx: sent 7/5 UCx: 40K colonies diptheroids  7/5 Sputum: rare GNR, pending 7/5 MRSA PCR: negative 7/5 Respiratory panel: negative  Carlean Jews, Pharm.D. PGY2 Pharmacy Resident 01/16/2017 1:04 PM Main Pharmacy: [phone]

## 2017-01-16 NOTE — Progress Notes (Signed)
Nutrition Brief Note  Patient identified on the Malnutrition Screening Tool (MST) Report.  Minimal amount of weight loss, 5% within 3 months is not significant for the time frame.  Wt Readings from Last 6 Encounters:  01/16/17 270 lb 3.2 oz (122.6 kg)  12/22/16 283 lb 9.6 oz (128.6 kg)  12/16/16 280 lb 12 oz (127.3 kg)  11/06/16 283 lb (128.4 kg)  10/30/16 284 lb 8 oz (129 kg)  03/26/16 297 lb 12.8 oz (135.1 kg)    Body mass index is 38.77 kg/m. Patient meets criteria for obesity based on current BMI.   Current diet order is heart healthy, patient is consuming approximately 100% of meals at this time. Labs and medications reviewed.   No nutrition interventions warranted at this time. If nutrition issues arise, please consult RD.   Molli Barrows, RD, LDN, [place] Pager [phone] After Hours Pager [phone]

## 2017-01-16 NOTE — Progress Notes (Signed)
Progress Note  Patient Name: Scott Rollins Date of Encounter: 01/16/2017  Primary Cardiologist: Allred/ Harrington Challenger  Subjective   Breathing better going for CXR  Inpatient Medications    Scheduled Meds: . allopurinol  300 mg Oral Daily  . bisoprolol-hydrochlorothiazide  1 tablet Oral q morning - 10a  . citalopram  40 mg Oral QHS  . enoxaparin (LOVENOX) injection  1 mg/kg Subcutaneous Q12H  . feeding supplement (ENSURE ENLIVE)  237 mL Oral BID BM  . gabapentin  300 mg Oral QHS  . ipratropium-albuterol  3 mL Nebulization Q6H  . lisinopril  20 mg Oral BID  . sodium chloride flush  3 mL Intravenous Q12H   Continuous Infusions: . sodium chloride 100 mL/hr at 01/16/17 0619  . azithromycin Stopped (01/15/17 1215)  . cefTRIAXone (ROCEPHIN)  IV Stopped (01/15/17 1028)  . diltiazem (CARDIZEM) infusion 10 mg/hr (01/16/17 0619)   PRN Meds: acetaminophen **OR** acetaminophen, bisacodyl, dextromethorphan-guaiFENesin, HYDROcodone-acetaminophen, levalbuterol, morphine injection, ondansetron **OR** ondansetron (ZOFRAN) IV, senna-docusate, zolpidem   Vital Signs    Vitals:   01/15/17 2241 01/16/17 0000 01/16/17 0557 01/16/17 0614  BP:  (!) 167/65 (!) 160/67   Pulse:  84 74   Resp: 17 (!) 22 20 17   Temp:  99.5 F (37.5 C) 99 F (37.2 C)   TempSrc:  Oral Oral   SpO2:  96% 98%   Weight:   270 lb 3.2 oz (122.6 kg)   Height:        Intake/Output Summary (Last 24 hours) at 01/16/17 0818 Last data filed at 01/16/17 0400  Gross per 24 hour  Intake          2760.83 ml  Output                0 ml  Net          2760.83 ml   Filed Weights   01/15/17 0633 01/15/17 1601 01/16/17 0557  Weight: 281 lb (127.5 kg) 268 lb 12.8 oz (121.9 kg) 270 lb 3.2 oz (122.6 kg)    Telemetry    NSR no PAF or MAT - Personally Reviewed  ECG    NSR no acute ST changes  - Personally Reviewed  Physical Exam  Obese male with COPD  GEN: No acute distress.   Neck: No JVD Cardiac: RRR, no murmurs, rubs,  or gallops.  Respiratory: exp wheezing  GI: Soft, nontender, non-distended  MS: No edema; No deformity. Neuro:  Nonfocal  Psych: Normal affect   Labs    Chemistry Recent Labs Lab 01/15/17 0630 01/16/17 0328  NA 137 136  K 4.2 4.0  CL 95* 96*  CO2 25 29  GLUCOSE 99 108*  BUN 23* 31*  CREATININE 1.17 1.25*  CALCIUM 9.1 8.8*  PROT 7.4  --   ALBUMIN 3.3*  --   AST 20  --   ALT 19  --   ALKPHOS 180*  --   BILITOT 2.7*  --   GFRNONAA >60 58*  GFRAA >60 >60  ANIONGAP 17* 11     Hematology Recent Labs Lab 01/15/17 0630 01/16/17 0328  WBC 13.3* 10.3  RBC 5.34 4.87  HGB 15.0 13.7  HCT 47.7 43.8  MCV 89.3 89.9  MCH 28.1 28.1  MCHC 31.4 31.3  RDW 14.4 14.6  PLT 356 324    Cardiac Enzymes Recent Labs Lab 01/15/17 1558 01/15/17 2111 01/16/17 0328  TROPONINI 0.04* 0.03* 0.03*    Recent Labs Lab 01/15/17 0639  TROPIPOC 0.02  BNP Recent Labs Lab 01/15/17 0630  BNP 144.7*     DDimer No results for input(s): DDIMER in the last 168 hours.   Radiology    Dg Chest Port 1 View  Result Date: 01/15/2017 CLINICAL DATA:  Acute onset of cough and intermittent tachycardia. Atrial fibrillation. Initial encounter. EXAM: PORTABLE CHEST 1 VIEW COMPARISON:  Chest radiograph performed 12/22/2016 FINDINGS: The lungs are well-aerated. Mild bibasilar opacities likely reflect atelectasis. There is no evidence of pleural effusion or pneumothorax. The cardiomediastinal silhouette is within normal limits. No acute osseous abnormalities are seen. IMPRESSION: Mild bibasilar opacities likely reflect atelectasis. Lungs otherwise clear. Electronically Signed   By: Garald Balding M.D.   On: 01/15/2017 06:57    Cardiac Studies   Study Conclusions  - Left ventricle: The cavity size was normal. Wall thickness was   increased in a pattern of moderate LVH. Systolic function was   normal. The estimated ejection fraction was in the range of 55%   to 60%. Wall motion was normal;  there were no regional wall   motion abnormalities. Doppler parameters are consistent with   abnormal left ventricular relaxation (grade 1 diastolic   dysfunction). - Aortic valve: There was no stenosis. - Aorta: Ascending aortic diameter: 37 mm (S). - Ascending aorta: The ascending aorta was borderline dilated. - Mitral valve: Mildly calcified annulus. There was no significant   regurgitation. - Left atrium: The atrium was mildly dilated. - Right ventricle: The cavity size was normal. Systolic function   was normal. - Pulmonary arteries: No complete TR doppler jet so unable to   estimate PA systolic pressure. - Systemic veins: IVC measured 2.3 cm with normal respirophasic   variation, suggesting RA pressure 8 mmHg.  Impressions:  - Normal LV size with moderate LV hypertrophy. EF 55-60%. Normal RV   size and systolic function. No significant valvular   abnormalities.  Patient Profile     66 y.o. male admitted with COPD exacerbation Atrial arrhythmias with both MAT and PAF Seen by [name] multiple times and he defers AAT and anticoagulation. Indicating primary Rx is treatment of COPD   Assessment & Plan    1) MAT:  None this am as COPD improved on low dose bisoprolol  2) PAF :  Documented this admission see note EP thought to be a poor candidate for anti-coagulation (compliance) and AAT 3) COPD improved with bipap going for CXR plan per primary service   Signed, Jenkins Rouge, MD  01/16/2017, 8:18 AM

## 2017-01-16 NOTE — Patient Outreach (Signed)
[facility]) Care Management  01/16/2017  Scott Rollins December 25, 1950 [id number]   Patient was called to follow up on medication assistance. He did not answer his mobile number. Review of chart showed patient is currently hospitalized due to COPD exacerbation.  [employer]'s Patient Assistance Program was called on the patient's behalf.  The representative said they still do not have pages 4 and 5 of the application.  Heather at Dr. Rulon Sera office was called. Nira Conn said the application has been faxed several times.  She was asked to send the application to me and I will fax it to [name].     Plan:  Fax the application (once received) to [name] on the patient's behalf.  Follow up in 3-5 business day since he has been hospitalized.  [name], PharmD, [place] Clinical Pharmacist [phone]

## 2017-01-16 NOTE — Progress Notes (Signed)
Pt states he may wear CPAP later but right not he does not want to wear tonight. I told him to call RT if he changes his mond

## 2017-01-16 NOTE — Telephone Encounter (Signed)
Denyse Amass with [facility] called stating that [employer] needs pages 4 and 5 faxed to them for pts prescription savings program. I advised Alwyn Ren that we have faxed this form several times, but we will re-fax it. She requested that we fax form to her and she will fax form/pages to [employer]. Katina's fax number is [phone]. Form faxed to Alwyn Ren who has been helping pt with this.

## 2017-01-16 NOTE — Progress Notes (Signed)
PROGRESS NOTE    Scott Rollins  NOB:[id number] DOB: 1950/12/06 DOA: 01/15/2017 PCP: [name], MD    Brief Narrative:  66 year old gentleman with h/o COPD, hypertension, gout and depression admitted for sob, cough and was hypoxic. He was admitted for evaluation of acute respiratory failure secondary to copd exacerbation and atrial fibrillation.   Assessment & Plan:   Active Problems:   Essential hypertension   Coronary artery disease due to lipid rich plaque   EMPHYSEMA   DYSPNEA   TOBACCO ABUSE   ELEVATED PROSTATE SPECIFIC ANTIGEN   Chronic gout   Chronic pain syndrome   COPD (chronic obstructive pulmonary disease) (HCC)   COPD with acute exacerbation (HCC)   OSA (obstructive sleep apnea)   Atrial fibrillation (HCC)   Acute respiratory failure with hypoxia secondary to acute copd exacerbation: Admitted for IV steroids, BIPAP overnight and nebs to use as needed.   oxygen to keep sats between 88 to 90%.  Repeat CXR this am shows mild pulm edema, no consolidation or effusion. Stop IV fluids.  duo nebs and xopenex nebs every 6 hours. brovana and Pulmicort added today.  Respiratory panel pending.    Mild acute on chronic diastolic heart failure:  Mild pulm edema on CXR.  On lasix 40 mg daily prn for fluid overload.last echocardiogram last month, showing  Normal LV size with moderate LV hypertrophy. EF 55-60%. Normal RV size and systolic function. No significant valvular   abnormalities,. Grade 1 diastolic dysfunction.    Atrial fibrillation:  Rate better this am.  On cardizem gtt.  Plan to transition to oral Cardizem.  On Lovenox therapeutic dose for anti coagulation.  Cardiology consulted and recommendations given.    Tobacco abuse:  Counseled.    Hypertension:  Controlled.   OSA:  Non compliant to CPAP.   Mild elevated troponins possibly from demand ischemia from  atrial tachycardia     Acute kidney injury:  Creatinine ranging from 1 to 1.3.    Mild fluid overload.  Monitor.  Off lasix .  Normal lactic acid.   DVT prophylaxis: (Lovenox/) Code Status: (full code.  Family Communication: none at bedside Disposition Plan:PENDING resolution of respiratory issues.    Consultants:   Cardiology  EP.    Procedures: none.    Antimicrobials: rocephin and zithromax.      Subjective: Dyspneic while talking.  No chest pain.  No nausea or vomiting.  No abdominal pain.  Headache present.   Objective: Vitals:   01/16/17 0000 01/16/17 0557 01/16/17 0614 01/16/17 0848  BP: (!) 167/65 (!) 160/67  (!) 157/84  Pulse: 84 74    Resp: (!) 22 20 17    Temp: 99.5 F (37.5 C) 99 F (37.2 C)  98.5 F (36.9 C)  TempSrc: Oral Oral  Oral  SpO2: 96% 98%  91%  Weight:  122.6 kg (270 lb 3.2 oz)    Height:        Intake/Output Summary (Last 24 hours) at 01/16/17 0947 Last data filed at 01/16/17 0400  Gross per 24 hour  Intake          2760.83 ml  Output                0 ml  Net          2760.83 ml   Filed Weights   01/15/17 0633 01/15/17 1601 01/16/17 0557  Weight: 127.5 kg (281 lb) 121.9 kg (268 lb 12.8 oz) 122.6 kg (270 lb 3.2 oz)  Examination:  General exam: Appears in mild distress from dyspnea from talking. On 3 lit of Simpson oxygen.  Respiratory system: diminished breath sounds bilaterally anteriorly. No wheezing heard.  Cardiovascular system: s1 s2 heard, irregular, , no murmers.  Gastrointestinal system: Abdomen is nondistended, soft and nontender. No organomegaly or masses felt. Normal bowel sounds heard. Central nervous system: Alert and oriented. No focal neurological deficits. Extremities: Symmetric 5 x 5 power.pedal edema present.  Skin: No rashes, lesions or ulcers Psychiatry: Judgement and insight appear normal. Mood & affect appropriate.     Data Reviewed: I have personally reviewed following labs and imaging studies  CBC:  Recent Labs Lab 01/15/17 0630 01/16/17 0328  WBC 13.3* 10.3  NEUTROABS  10.5*  --   HGB 15.0 13.7  HCT 47.7 43.8  MCV 89.3 89.9  PLT 356 419   Basic Metabolic Panel:  Recent Labs Lab 01/15/17 0630 01/15/17 1246 01/16/17 0328  NA 137  --  136  K 4.2  --  4.0  CL 95*  --  96*  CO2 25  --  29  GLUCOSE 99  --  108*  BUN 23*  --  31*  CREATININE 1.17  --  1.25*  CALCIUM 9.1  --  8.8*  MG  --  2.1  --   PHOS  --  4.6  --    GFR: Estimated Creatinine Clearance: 75.3 mL/min (A) (by C-G formula based on SCr of 1.25 mg/dL (H)). Liver Function Tests:  Recent Labs Lab 01/15/17 0630  AST 20  ALT 19  ALKPHOS 180*  BILITOT 2.7*  PROT 7.4  ALBUMIN 3.3*   No results for input(s): LIPASE, AMYLASE in the last 168 hours. No results for input(s): AMMONIA in the last 168 hours. Coagulation Profile:  Recent Labs Lab 01/15/17 0630  INR 1.11   Cardiac Enzymes:  Recent Labs Lab 01/15/17 1558 01/15/17 2111 01/16/17 0328  TROPONINI 0.04* 0.03* 0.03*   BNP (last 3 results) No results for input(s): PROBNP in the last 8760 hours. HbA1C:  Recent Labs  01/15/17 0918  HGBA1C 5.3   CBG: No results for input(s): GLUCAP in the last 168 hours. Lipid Profile: No results for input(s): CHOL, HDL, LDLCALC, TRIG, CHOLHDL, LDLDIRECT in the last 72 hours. Thyroid Function Tests: No results for input(s): TSH, T4TOTAL, FREET4, T3FREE, THYROIDAB in the last 72 hours. Anemia Panel: No results for input(s): VITAMINB12, FOLATE, FERRITIN, TIBC, IRON, RETICCTPCT in the last 72 hours. Sepsis Labs:  Recent Labs Lab 01/15/17 0926  01/15/17 2111 01/16/17 0018 01/16/17 0328 01/16/17 0655  PROCALCITON 0.68  --   --   --   --   --   LATICACIDVEN  --   < > 1.5 0.9 1.0 0.7  < > = values in this interval not displayed.  Recent Results (from the past 240 hour(s))  Culture, Urine     Status: None (Preliminary result)   Collection Time: 01/15/17 10:51 AM  Result Value Ref Range Status   Specimen Description URINE, RANDOM  Final   Special Requests NONE  Final    Culture CULTURE REINCUBATED FOR BETTER GROWTH  Final   Report Status PENDING  Incomplete  Culture, expectorated sputum-assessment     Status: None   Collection Time: 01/15/17  1:31 PM  Result Value Ref Range Status   Specimen Description EXPECTORATED SPUTUM  Final   Special Requests NONE  Final   Sputum evaluation THIS SPECIMEN IS ACCEPTABLE FOR SPUTUM CULTURE  Final   Report Status  01/15/2017 FINAL  Final  Culture, respiratory (NON-Expectorated)     Status: None (Preliminary result)   Collection Time: 01/15/17  1:31 PM  Result Value Ref Range Status   Specimen Description EXPECTORATED SPUTUM  Final   Special Requests NONE Reflexed from [id number]  Final   Gram Stain   Final    MODERATE WBC PRESENT, PREDOMINANTLY PMN RARE GRAM NEGATIVE RODS    Culture PENDING  Incomplete   Report Status PENDING  Incomplete  MRSA PCR Screening     Status: None   Collection Time: 01/15/17  4:38 PM  Result Value Ref Range Status   MRSA by PCR NEGATIVE NEGATIVE Final    Comment:        The GeneXpert MRSA Assay (FDA approved for NASAL specimens only), is one component of a comprehensive MRSA colonization surveillance program. It is not intended to diagnose MRSA infection nor to guide or monitor treatment for MRSA infections.          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 Stable mediastinal contours with a prominent epicardial fat pad at the RIGHT cardiophrenic angle unchanged since 07/17/2004. Accentuated interstitial markings which appear increased since 11/04/2016 favor mild pulmonary edema.  No segmental consolidation, pleural effusion or pneumothorax. Bones demineralized. IMPRESSION: Question mild pulmonary edema. Electronically Signed   By: Lavonia Dana M.D.   On: 01/16/2017 08:34   Dg Chest Port 1 View  Result Date: 01/15/2017 CLINICAL DATA:  Acute onset of cough and intermittent tachycardia. Atrial fibrillation. Initial encounter. EXAM: PORTABLE CHEST 1 VIEW COMPARISON:  Chest radiograph performed 12/22/2016 FINDINGS: The lungs are well-aerated. Mild bibasilar opacities likely reflect atelectasis. There is no evidence of pleural effusion or pneumothorax. The cardiomediastinal silhouette is within normal limits. No acute osseous abnormalities are seen. IMPRESSION: Mild bibasilar opacities likely reflect atelectasis. Lungs otherwise clear. Electronically Signed   By: Garald Balding M.D.   On: 01/15/2017 06:57        Scheduled Meds: . allopurinol  300 mg Oral Daily  . bisoprolol-hydrochlorothiazide  1 tablet Oral q morning - 10a  . citalopram  40 mg Oral QHS  . enoxaparin (LOVENOX) injection  1 mg/kg Subcutaneous Q12H  . feeding supplement (ENSURE ENLIVE)  237 mL Oral BID BM  . gabapentin  300 mg Oral QHS  . ipratropium-albuterol  3 mL Nebulization Q6H  . lisinopril  20 mg Oral BID  . sodium chloride flush  3 mL Intravenous Q12H   Continuous Infusions: . sodium chloride 100 mL/hr at 01/16/17 0619  . azithromycin Stopped (01/15/17 1215)  . cefTRIAXone (ROCEPHIN)  IV Stopped (01/15/17 1028)  . diltiazem (CARDIZEM) infusion 10 mg/hr (01/16/17 0619)     LOS: 0 days    Time spent: 52 minutes    Lilliona Blakeney, MD [facility] Pager [phone]  If 7PM-7AM, please contact night-coverage [email or web address] Password [email or web address] 01/16/2017, 9:47 AM

## 2017-01-17 DIAGNOSIS — I10 Essential (primary) hypertension: Secondary | ICD-10-CM

## 2017-01-17 DIAGNOSIS — G894 Chronic pain syndrome: Secondary | ICD-10-CM

## 2017-01-17 LAB — CULTURE, RESPIRATORY

## 2017-01-17 LAB — BASIC METABOLIC PANEL
Anion gap: 8 (ref 5–15)
BUN: 24 mg/dL — AB (ref 6–20)
CALCIUM: 9.5 mg/dL (ref 8.9–10.3)
CO2: 33 mmol/L — ABNORMAL HIGH (ref 22–32)
CREATININE: 0.95 mg/dL (ref 0.61–1.24)
Chloride: 96 mmol/L — ABNORMAL LOW (ref 101–111)
GFR calc Af Amer: >60 mL/min (ref >60)
GLUCOSE: 119 mg/dL — AB (ref 65–99)
Potassium: 4.3 mmol/L (ref 3.5–5.1)
Sodium: 137 mmol/L (ref 135–145)

## 2017-01-17 LAB — LACTIC ACID, PLASMA
LACTIC ACID, VENOUS: 0.8 mmol/L (ref 0.5–1.9)
LACTIC ACID, VENOUS: 1.8 mmol/L (ref 0.5–1.9)

## 2017-01-17 LAB — PROCALCITONIN: Procalcitonin: 0.23 ng/mL

## 2017-01-17 MED ORDER — DOXYCYCLINE HYCLATE 50 MG PO CAPS: 100.0000 mg | ORAL_CAPSULE | Freq: Two times a day (BID) | ORAL | 0 refills | Status: DC

## 2017-01-17 MED ORDER — ARFORMOTEROL TARTRATE 15 MCG/2ML IN NEBU: 15.0000 ug | INHALATION_SOLUTION | Freq: Two times a day (BID) | RESPIRATORY_TRACT | 2 refills | Status: DC

## 2017-01-17 MED ORDER — DILTIAZEM HCL 30 MG PO TABS: 30.0000 mg | ORAL_TABLET | Freq: Two times a day (BID) | ORAL | 0 refills | Status: DC

## 2017-01-17 MED ORDER — IPRATROPIUM-ALBUTEROL 0.5-2.5 (3) MG/3ML IN SOLN: 3.0000 mL | RESPIRATORY_TRACT | Status: DC | PRN

## 2017-01-17 MED ORDER — IPRATROPIUM-ALBUTEROL 0.5-2.5 (3) MG/3ML IN SOLN: 3.0000 mL | Freq: Four times a day (QID) | RESPIRATORY_TRACT | 3 refills | Status: DC | PRN

## 2017-01-17 MED ORDER — BUDESONIDE 0.25 MG/2ML IN SUSP: 0.2500 mg | Freq: Two times a day (BID) | RESPIRATORY_TRACT | 2 refills | Status: DC

## 2017-01-17 MED ORDER — APIXABAN 5 MG PO TABS: 5.0000 mg | ORAL_TABLET | Freq: Two times a day (BID) | ORAL | 0 refills | Status: DC

## 2017-01-17 MED ORDER — APIXABAN 5 MG PO TABS: 5.0000 mg | ORAL_TABLET | Freq: Two times a day (BID) | ORAL | Status: DC

## 2017-01-17 MED ORDER — DILTIAZEM HCL 30 MG PO TABS
30.0000 mg | ORAL_TABLET | Freq: Two times a day (BID) | ORAL | Status: DC
  Administered 2017-01-17: 30 mg via ORAL
  Filled 2017-01-17: qty 1

## 2017-01-17 MED ORDER — ENSURE ENLIVE PO LIQD: 237.0000 mL | Freq: Two times a day (BID) | ORAL | 12 refills | Status: AC

## 2017-01-17 MED ORDER — PREDNISONE 10 MG PO TABS: ORAL_TABLET | ORAL | 0 refills | Status: DC

## 2017-01-17 MED ORDER — SODIUM CHLORIDE 0.9 % IV BOLUS (SEPSIS)
250.0000 mL | Freq: Once | INTRAVENOUS | Status: AC
  Administered 2017-01-17: 250 mL via INTRAVENOUS

## 2017-01-17 MED ORDER — CITALOPRAM HYDROBROMIDE 40 MG PO TABS: 20.0000 mg | ORAL_TABLET | Freq: Every day | ORAL | 3 refills | Status: DC

## 2017-01-17 NOTE — Plan of Care (Signed)
Problem: Safety: Goal: Ability to remain free from injury will improve Outcome: Progressing No falls, injuries or skin breakdown this shift. Fall risk bundle remains in place. Will continue to monitor and assess this shift.

## 2017-01-17 NOTE — Progress Notes (Signed)
ANTICOAGULATION CONSULT NOTE - Follow Up Consult  Pharmacy Consult for apixaban Indication: atrial fibrillation  Allergies  Allergen Reactions  . Amitriptyline Hcl     REACTION: nausea, elevated bp  . Ezetimibe     REACTION: chest pain, fatigue    Patient Measurements: Height: 5\' 10"  (177.8 cm) Weight: 268 lb 8 oz (121.8 kg) IBW/kg (Calculated) : 73  Vital Signs: Temp: 97.6 F (36.4 C) (07/07 0500) Temp Source: Oral (07/07 0500) BP: 160/83 (07/07 0500) Pulse Rate: 74 (07/07 0835)  Labs:  Recent Labs  01/15/17 0630 01/15/17 1558 01/15/17 2111 01/16/17 0328  HGB 15.0  --   --  13.7  HCT 47.7  --   --  43.8  PLT 356  --   --  324  APTT 45*  --   --   --   LABPROT 14.4  --   --   --   INR 1.11  --   --   --   CREATININE 1.17  --   --  1.25*  TROPONINI  --  0.04* 0.03* 0.03*    Estimated Creatinine Clearance: 75 mL/min (A) (by C-G formula based on SCr of 1.25 mg/dL (H)).   Assessment: RU 66 yo M presented on 7/5 w/ Afib. CHA2DS2VASc = 2 (vasc disease, age). No PTA anticoag; per cards notes, plan to avoid [facility] longterm due to h/o noncompliance.   CBC wnl, no bleeding noted  Goal of Therapy:  Monitor platelets by anticoagulation protocol: Yes   Plan:  Discontinued enoxaparin 130mg , last dose 0910 at 7/7 Start apixaban 5mg  BID for nonvalvular afib. Monitor CBC, Scr, s/sx of bleeding  Zigmond Trela 01/17/2017,2:11 PM

## 2017-01-17 NOTE — Discharge Summary (Signed)
Physician Discharge Summary  Scott Rollins PPJ:[id number] DOB: 13-Dec-1950 DOA: 01/15/2017  PCP: [name], MD  Admit date: 01/15/2017 Discharge date: 01/17/2017  Admitted From: Home.  Disposition:  Home.   Recommendations for Outpatient Follow-up:  1. Follow up with PCP in 1-2 weeks 2. Please obtain BMP/CBC in one week 3. Please follow up with [name] as recommended.  4. Please follow up with pulmonology in 2 weeks.      Discharge Condition:stable.  CODE STATUS: full code.  Diet recommendation: Heart Healthy  Brief/Interim Summary: 66 year old gentleman with h/o COPD, hypertension, gout and depression admitted for sob, cough and was hypoxic. He was admitted for evaluation of acute respiratory failure secondary to copd exacerbation and atrial fibrillation.   Discharge Diagnoses:  Active Problems:   Essential hypertension   Coronary artery disease due to lipid rich plaque   EMPHYSEMA   DYSPNEA   TOBACCO ABUSE   ELEVATED PROSTATE SPECIFIC ANTIGEN   Chronic gout   Chronic pain syndrome   COPD (chronic obstructive pulmonary disease) (HCC)   COPD with acute exacerbation (HCC)   OSA (obstructive sleep apnea)   Atrial fibrillation with RVR (HCC)   SOB (shortness of breath)  Acute respiratory failure with hypoxia secondary to acute copd exacerbation: Admitted for IV steroids, transitioned to po prednisone taper.  [facility] oxygen to keep sats between 88 to 90%.  Repeat CXR this am shows mild pulm edema, no consolidation or effusion. Stopped IV fluids.  duo nebs and xopenex nebs every 6 hours. brovana and Pulmicort added. His breathing improved. Discharged on the same and recommended to follow up with pulmonology in 1 to 2 weeks.  Respiratory panel negative.    Mild acute on chronic diastolic heart failure:  Mild pulm edema on CXR.  On lasix 40 mg daily prn for fluid overload.last echocardiogram last month, showing  Normal LV size with moderate LV hypertrophy. EF 55-60%.  Normal RVsize and systolic function. No significant valvular abnormalities,. Grade 1 diastolic dysfunction.  His lasix was briefly held for worsening  Creatinine,  Can go back to lasix on discharge.    Atrial fibrillation:  Brought on by the pulmonary issues.  Rate better this am.  On cardizem gtt transitioned to po cardizem. On Lovenox therapeutic dose for anti coagulation transitioned to eliquis on discharge. Discussed with the patient being compliant to anti coagulation.   Cardiology consulted and recommendations given.    Tobacco abuse:  Counseled.    Hypertension:  Controlled.   OSA:  Non compliant to CPAP.   Mild elevated troponins possibly from demand ischemia from  atrial tachycardia     Acute kidney injury:  Creatinine ranging from 1 to 1.3. Repeat creatinine is back to baseline on discharge.  Mild fluid overload.  Monitor.  Normal lactic acid.    Discharge Instructions  Discharge Instructions    Diet - low sodium heart healthy    Complete by:  As directed    Discharge instructions    Complete by:  As directed    Follow up with [name] in one week.  Please follow up with PCP in one week.     Allergies as of 01/17/2017      Reactions   Amitriptyline Hcl    REACTION: nausea, elevated bp   Ezetimibe    REACTION: chest pain, fatigue      Medication List    STOP taking these medications   aspirin 325 MG tablet   aspirin EC 81 MG  tablet   budesonide-formoterol 160-4.5 MCG/ACT inhaler Commonly known as:  SYMBICORT   naproxen 500 MG tablet Commonly known as:  NAPROSYN   Oxycodone HCl 10 MG Tabs     TAKE these medications   acetaminophen 500 MG tablet Commonly known as:  TYLENOL Take 500 mg by mouth every 6 (six) hours as needed for mild pain.   albuterol (2.5 MG/3ML) 0.083% nebulizer solution Commonly known as:  PROVENTIL USE ONE VIAL IN NEBULIZER EVERY 4 HOURS AS NEEDED FOR SHORTNESS OF BREATH OR WHEEZING What changed:   Another medication with the same name was removed. Continue taking this medication, and follow the directions you see here.   allopurinol 300 MG tablet Commonly known as:  ZYLOPRIM Take 1 tablet (300 mg total) by mouth daily.   apixaban 5 MG Tabs tablet Commonly known as:  ELIQUIS Take 1 tablet (5 mg total) by mouth 2 (two) times daily.   arformoterol 15 MCG/2ML Nebu Commonly known as:  BROVANA Take 2 mLs (15 mcg total) by nebulization 2 (two) times daily.   bisoprolol-hydrochlorothiazide 5-6.25 MG tablet Commonly known as:  ZIAC Take 1 tablet by mouth every morning.   budesonide 0.25 MG/2ML nebulizer solution Commonly known as:  PULMICORT Take 2 mLs (0.25 mg total) by nebulization 2 (two) times daily.   citalopram 40 MG tablet Commonly known as:  CELEXA Take 0.5 tablets (20 mg total) by mouth at bedtime. What changed:  how much to take   cyclobenzaprine 10 MG tablet Commonly known as:  FLEXERIL Take 1 tablet (10 mg total) by mouth at bedtime.   dextromethorphan-guaiFENesin 30-600 MG 12hr tablet Commonly known as:  MUCINEX DM Take 1 tablet by mouth 2 (two) times daily as needed for cough. What changed:  Another medication with the same name was removed. Continue taking this medication, and follow the directions you see here.   diltiazem 30 MG tablet Commonly known as:  CARDIZEM Take 1 tablet (30 mg total) by mouth every 12 (twelve) hours.   doxycycline 50 MG capsule Commonly known as:  VIBRAMYCIN Take 2 capsules (100 mg total) by mouth 2 (two) times daily.   feeding supplement (ENSURE ENLIVE) Liqd Take 237 mLs by mouth 2 (two) times daily between meals.   furosemide 40 MG tablet Commonly known as:  LASIX Take 2 tablets (80 mg total) by mouth daily as needed. What changed:  reasons to take this   gabapentin 300 MG capsule Commonly known as:  NEURONTIN Take 300 mg by mouth at bedtime.   ipratropium-albuterol 0.5-2.5 (3) MG/3ML Soln Commonly known as:   DUONEB Take 3 mLs by nebulization every 6 (six) hours as needed.   lisinopril 20 MG tablet Commonly known as:  PRINIVIL,ZESTRIL TAKE ONE TABLET BY MOUTH TWICE DAILY What changed:  See the new instructions.   predniSONE 10 MG tablet Commonly known as:  DELTASONE Prednisone 60 mg daily for 3 days followed by  Prednisone 40 mg daily for 3 days followed by  Prednisone 20 mg daily for 3 days followed by Prednisone 10 mg daily for 3 days.   PROBIOTIC PO Take 1 capsule by mouth daily.   zolpidem 10 MG tablet Commonly known as:  AMBIEN Take 1 tablet (10 mg total) by mouth at bedtime as needed for sleep.      Follow-up Information    [name], [name], MD. Schedule an appointment as soon as possible for a visit in 1 week(s).   Specialty:  Family Medicine Why:  post hospitalization visit.  Contact information: [address] [place] [address] [phone]        Thompson Grayer, MD. Schedule an appointment as soon as possible for a visit in 1 week(s).   Specialty:  Cardiology Why:  follow up appointment.  Contact information: [address] [place] [address] [phone]        [facility]. Schedule an appointment as soon as possible for a visit in 2 week(s).   Specialty:  Pulmonology Why:  for COPD evaluation.  Contact information: [place] [phone]         Allergies  Allergen Reactions  . Amitriptyline Hcl     REACTION: nausea, elevated bp  . Ezetimibe     REACTION: chest pain, fatigue    Consultations:  Cardiology.    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 Stable mediastinal contours with a prominent epicardial fat pad at the RIGHT cardiophrenic angle unchanged since 07/17/2004. Accentuated interstitial markings which appear increased since 11/04/2016 favor mild pulmonary edema. No segmental consolidation, pleural effusion or pneumothorax. Bones demineralized. IMPRESSION: Question mild pulmonary edema. Electronically Signed   By: Lavonia Dana M.D.   On: 01/16/2017 08:34   Dg Chest 2 View  Result Date: 12/23/2016 CLINICAL DATA:  Upper back pain history of pneumonia EXAM: CHEST  2 VIEW COMPARISON:  11/04/2016 FINDINGS: Hyperinflation. No focal consolidation or pleural effusion. Mild cardiomegaly. No pneumothorax. IMPRESSION: No active cardiopulmonary disease.  Mild cardiomegaly Electronically Signed   By: Donavan Foil M.D.   On: 12/23/2016 00:19   Dg Thoracic Spine 2 View  Result Date: 12/23/2016 CLINICAL DATA:  Upper back pain EXAM: THORACIC SPINE 2 VIEWS COMPARISON:  12/22/2016 FINDINGS: Minimal scoliosis. Degenerative osteophytes. Vertebral body heights are maintained. IMPRESSION: Degenerative changes.  No acute osseous abnormality. Electronically Signed   By: Donavan Foil M.D.   On: 12/23/2016 00:15   Dg Pelvis 1-2 Views  Result Date: 12/23/2016 CLINICAL DATA:  Upper back pain EXAM: PELVIS - 1-2 VIEW COMPARISON:  10/04/2010 FINDINGS: Left SI joint disease. No fracture or malalignment. Mild arthritis of the bilateral hips. Multiple prostate seeds. IMPRESSION: Mild degenerative changes.  No acute osseous abnormality. Electronically Signed   By: Donavan Foil M.D.   On: 12/23/2016 00:13   Dg Chest Port 1 View  Result Date: 01/15/2017 CLINICAL DATA:  Acute onset of cough and intermittent tachycardia. Atrial fibrillation. Initial encounter. EXAM: PORTABLE CHEST 1 VIEW COMPARISON:  Chest radiograph performed 12/22/2016 FINDINGS: The lungs are well-aerated. Mild  bibasilar opacities likely reflect atelectasis. There is no evidence of pleural effusion or pneumothorax. The cardiomediastinal silhouette is within normal limits. No acute osseous abnormalities are seen. IMPRESSION: Mild bibasilar opacities likely reflect atelectasis. Lungs otherwise clear. Electronically Signed   By: Garald Balding M.D.   On: 01/15/2017 06:57       Subjective:  Off oxygen, no breathing issues. Cough is better.  No chest pain.   Discharge Exam: Vitals:   01/17/17 0500 01/17/17 0835  BP: (!) 160/83   Pulse: 69 74  Resp: 18 17  Temp: 97.6 F (36.4 C)    Vitals:  01/17/17 0020 01/17/17 0500 01/17/17 0835 01/17/17 0842  BP: (!) 152/77 (!) 160/83    Pulse: 70 69 74   Resp: 17 18 17    Temp: 98 F (36.7 C) 97.6 F (36.4 C)    TempSrc: Oral Oral    SpO2: 98% 98% 96% 96%  Weight:  121.8 kg (268 lb 8 oz)    Height:        General: Pt is alert, awake, not in acute distress Cardiovascular: RRR, S1/S2 +, no rubs, no gallops Respiratory: CTA bilaterally, no wheezing, no rhonchi Abdominal: Soft, NT, ND, bowel sounds + Extremities: no edema, no cyanosis    The results of significant diagnostics from this hospitalization (including imaging, microbiology, ancillary and laboratory) are listed below for reference.     Microbiology: Recent Results (from the past 240 hour(s))  Culture, blood (Routine X 2) w Reflex to ID Panel     Status: None (Preliminary result)   Collection Time: 01/15/17  9:30 AM  Result Value Ref Range Status   Specimen Description BLOOD RIGHT FOREARM  Final   Special Requests   Final    BOTTLES DRAWN AEROBIC AND ANAEROBIC Blood Culture adequate volume   Culture NO GROWTH 2 DAYS  Final   Report Status PENDING  Incomplete  Culture, blood (Routine X 2) w Reflex to ID Panel     Status: None (Preliminary result)   Collection Time: 01/15/17  9:40 AM  Result Value Ref Range Status   Specimen Description BLOOD RIGHT HAND  Final   Special Requests    Final    BOTTLES DRAWN AEROBIC AND ANAEROBIC Blood Culture adequate volume   Culture NO GROWTH 2 DAYS  Final   Report Status PENDING  Incomplete  Culture, Urine     Status: Abnormal   Collection Time: 01/15/17 10:51 AM  Result Value Ref Range Status   Specimen Description URINE, RANDOM  Final   Special Requests NONE  Final   Culture (A)  Final    40,000 COLONIES/mL DIPHTHEROIDS(CORYNEBACTERIUM SPECIES) Standardized susceptibility testing for this organism is not available.    Report Status 01/16/2017 FINAL  Final  Culture, expectorated sputum-assessment     Status: None   Collection Time: 01/15/17  1:31 PM  Result Value Ref Range Status   Specimen Description EXPECTORATED SPUTUM  Final   Special Requests NONE  Final   Sputum evaluation THIS SPECIMEN IS ACCEPTABLE FOR SPUTUM CULTURE  Final   Report Status 01/15/2017 FINAL  Final  Culture, respiratory (NON-Expectorated)     Status: None   Collection Time: 01/15/17  1:31 PM  Result Value Ref Range Status   Specimen Description EXPECTORATED SPUTUM  Final   Special Requests NONE Reflexed from [id number]  Final   Gram Stain   Final    MODERATE WBC PRESENT, PREDOMINANTLY PMN RARE GRAM NEGATIVE RODS    Culture   Final    FEW HAEMOPHILUS INFLUENZAE BETA LACTAMASE POSITIVE    Report Status 01/17/2017 FINAL  Final  Respiratory Panel by PCR     Status: None   Collection Time: 01/15/17  4:38 PM  Result Value Ref Range Status   Adenovirus NOT DETECTED NOT DETECTED Final   Coronavirus 229E NOT DETECTED NOT DETECTED Final   Coronavirus HKU1 NOT DETECTED NOT DETECTED Final   Coronavirus NL63 NOT DETECTED NOT DETECTED Final   Coronavirus OC43 NOT DETECTED NOT DETECTED Final   Metapneumovirus NOT DETECTED NOT DETECTED Final   Rhinovirus / Enterovirus NOT DETECTED NOT DETECTED Final  Influenza A NOT DETECTED NOT DETECTED Final   Influenza B NOT DETECTED NOT DETECTED Final   Parainfluenza Virus 1 NOT DETECTED NOT DETECTED Final    Parainfluenza Virus 2 NOT DETECTED NOT DETECTED Final   Parainfluenza Virus 3 NOT DETECTED NOT DETECTED Final   Parainfluenza Virus 4 NOT DETECTED NOT DETECTED Final   Respiratory Syncytial Virus NOT DETECTED NOT DETECTED Final   Bordetella pertussis NOT DETECTED NOT DETECTED Final   Chlamydophila pneumoniae NOT DETECTED NOT DETECTED Final   Mycoplasma pneumoniae NOT DETECTED NOT DETECTED Final  MRSA PCR Screening     Status: None   Collection Time: 01/15/17  4:38 PM  Result Value Ref Range Status   MRSA by PCR NEGATIVE NEGATIVE Final    Comment:        The GeneXpert MRSA Assay (FDA approved for NASAL specimens only), is one component of a comprehensive MRSA colonization surveillance program. It is not intended to diagnose MRSA infection nor to guide or monitor treatment for MRSA infections.      Labs: BNP (last 3 results)  Recent Labs  11/04/16 2253 01/15/17 0630  BNP 57.2 657.8*   Basic Metabolic Panel:  Recent Labs Lab 01/15/17 0630 01/15/17 1246 01/16/17 0328 01/17/17 1353  NA 137  --  136 137  K 4.2  --  4.0 4.3  CL 95*  --  96* 96*  CO2 25  --  29 33*  GLUCOSE 99  --  108* 119*  BUN 23*  --  31* 24*  CREATININE 1.17  --  1.25* 0.95  CALCIUM 9.1  --  8.8* 9.5  MG  --  2.1  --   --   PHOS  --  4.6  --   --    Liver Function Tests:  Recent Labs Lab 01/15/17 0630  AST 20  ALT 19  ALKPHOS 180*  BILITOT 2.7*  PROT 7.4  ALBUMIN 3.3*   No results for input(s): LIPASE, AMYLASE in the last 168 hours. No results for input(s): AMMONIA in the last 168 hours. CBC:  Recent Labs Lab 01/15/17 0630 01/16/17 0328  WBC 13.3* 10.3  NEUTROABS 10.5*  --   HGB 15.0 13.7  HCT 47.7 43.8  MCV 89.3 89.9  PLT 356 324   Cardiac Enzymes:  Recent Labs Lab 01/15/17 1558 01/15/17 2111 01/16/17 0328  TROPONINI 0.04* 0.03* 0.03*   BNP: Invalid input(s): POCBNP CBG: No results for input(s): GLUCAP in the last 168 hours. D-Dimer No results for input(s):  DDIMER in the last 72 hours. Hgb A1c  Recent Labs  01/15/17 0918  HGBA1C 5.3   Lipid Profile No results for input(s): CHOL, HDL, LDLCALC, TRIG, CHOLHDL, LDLDIRECT in the last 72 hours. Thyroid function studies No results for input(s): TSH, T4TOTAL, T3FREE, THYROIDAB in the last 72 hours.  Invalid input(s): FREET3 Anemia work up No results for input(s): VITAMINB12, FOLATE, FERRITIN, TIBC, IRON, RETICCTPCT in the last 72 hours. Urinalysis    Component Value Date/Time   COLORURINE ORANGE (A) 01/15/2017 1051   APPEARANCEUR CLEAR 01/15/2017 1051   LABSPEC 1.025 01/15/2017 1051   PHURINE 5.5 01/15/2017 1051   GLUCOSEU 100 (A) 01/15/2017 1051   HGBUR NEGATIVE 01/15/2017 1051   HGBUR negative 08/21/2010 0831   BILIRUBINUR LARGE (A) 01/15/2017 1051   KETONESUR 40 (A) 01/15/2017 1051   PROTEINUR 100 (A) 01/15/2017 1051   UROBILINOGEN 0.2 08/21/2010 0831   NITRITE POSITIVE (A) 01/15/2017 1051   LEUKOCYTESUR NEGATIVE 01/15/2017 1051   Sepsis Labs Invalid  input(s): PROCALCITONIN,  WBC,  LACTICIDVEN Microbiology Recent Results (from the past 240 hour(s))  Culture, blood (Routine X 2) w Reflex to ID Panel     Status: None (Preliminary result)   Collection Time: 01/15/17  9:30 AM  Result Value Ref Range Status   Specimen Description BLOOD RIGHT FOREARM  Final   Special Requests   Final    BOTTLES DRAWN AEROBIC AND ANAEROBIC Blood Culture adequate volume   Culture NO GROWTH 2 DAYS  Final   Report Status PENDING  Incomplete  Culture, blood (Routine X 2) w Reflex to ID Panel     Status: None (Preliminary result)   Collection Time: 01/15/17  9:40 AM  Result Value Ref Range Status   Specimen Description BLOOD RIGHT HAND  Final   Special Requests   Final    BOTTLES DRAWN AEROBIC AND ANAEROBIC Blood Culture adequate volume   Culture NO GROWTH 2 DAYS  Final   Report Status PENDING  Incomplete  Culture, Urine     Status: Abnormal   Collection Time: 01/15/17 10:51 AM  Result Value Ref  Range Status   Specimen Description URINE, RANDOM  Final   Special Requests NONE  Final   Culture (A)  Final    40,000 COLONIES/mL DIPHTHEROIDS(CORYNEBACTERIUM SPECIES) Standardized susceptibility testing for this organism is not available.    Report Status 01/16/2017 FINAL  Final  Culture, expectorated sputum-assessment     Status: None   Collection Time: 01/15/17  1:31 PM  Result Value Ref Range Status   Specimen Description EXPECTORATED SPUTUM  Final   Special Requests NONE  Final   Sputum evaluation THIS SPECIMEN IS ACCEPTABLE FOR SPUTUM CULTURE  Final   Report Status 01/15/2017 FINAL  Final  Culture, respiratory (NON-Expectorated)     Status: None   Collection Time: 01/15/17  1:31 PM  Result Value Ref Range Status   Specimen Description EXPECTORATED SPUTUM  Final   Special Requests NONE Reflexed from [id number]  Final   Gram Stain   Final    MODERATE WBC PRESENT, PREDOMINANTLY PMN RARE GRAM NEGATIVE RODS    Culture   Final    FEW HAEMOPHILUS INFLUENZAE BETA LACTAMASE POSITIVE    Report Status 01/17/2017 FINAL  Final  Respiratory Panel by PCR     Status: None   Collection Time: 01/15/17  4:38 PM  Result Value Ref Range Status   Adenovirus NOT DETECTED NOT DETECTED Final   Coronavirus 229E NOT DETECTED NOT DETECTED Final   Coronavirus HKU1 NOT DETECTED NOT DETECTED Final   Coronavirus NL63 NOT DETECTED NOT DETECTED Final   Coronavirus OC43 NOT DETECTED NOT DETECTED Final   Metapneumovirus NOT DETECTED NOT DETECTED Final   Rhinovirus / Enterovirus NOT DETECTED NOT DETECTED Final   Influenza A NOT DETECTED NOT DETECTED Final   Influenza B NOT DETECTED NOT DETECTED Final   Parainfluenza Virus 1 NOT DETECTED NOT DETECTED Final   Parainfluenza Virus 2 NOT DETECTED NOT DETECTED Final   Parainfluenza Virus 3 NOT DETECTED NOT DETECTED Final   Parainfluenza Virus 4 NOT DETECTED NOT DETECTED Final   Respiratory Syncytial Virus NOT DETECTED NOT DETECTED Final   Bordetella pertussis  NOT DETECTED NOT DETECTED Final   Chlamydophila pneumoniae NOT DETECTED NOT DETECTED Final   Mycoplasma pneumoniae NOT DETECTED NOT DETECTED Final  MRSA PCR Screening     Status: None   Collection Time: 01/15/17  4:38 PM  Result Value Ref Range Status   MRSA by PCR NEGATIVE NEGATIVE Final  Comment:        The GeneXpert MRSA Assay (FDA approved for NASAL specimens only), is one component of a comprehensive MRSA colonization surveillance program. It is not intended to diagnose MRSA infection nor to guide or monitor treatment for MRSA infections.      Time coordinating discharge: Over 30 minutes  SIGNED:   Hosie Poisson, MD  [facility] 01/17/2017, 4:03 PM Pager   If 7PM-7AM, please contact night-coverage [email or web address] Password [email or web address]

## 2017-01-17 NOTE — Progress Notes (Signed)
Elevated lactic acid of 3.0  reported to hospitalist. Order of 24ml bolus given. Will continue to monitor and assess pt.

## 2017-01-18 ENCOUNTER — Encounter: Payer: Self-pay | Admitting: Family Medicine

## 2017-01-18 NOTE — Telephone Encounter (Signed)
Noted. Chart reviewed/EMR updated.

## 2017-01-19 ENCOUNTER — Telehealth: Payer: Self-pay

## 2017-01-19 NOTE — Telephone Encounter (Signed)
TCM phone contact noted.  Signed:  Crissie Sickles, MD           01/19/2017

## 2017-01-19 NOTE — Telephone Encounter (Signed)
Transition Care Management Follow-up Telephone Call   Date discharged? 01/17/2017   How have you been since you were released from the hospital? "tired, laying around"   Do you understand why you were in the hospital? yes   Do you understand the discharge instructions? yes   Where were you discharged to? Home. Lives with wife.    Items Reviewed:  Medications reviewed: yes  Allergies reviewed: yes  Dietary changes reviewed: yes  Referrals reviewed: yes   Functional Questionnaire:   Activities of Daily Living (ADLs):   He states they are independent in the following: ambulation, bathing and hygiene, feeding, continence, grooming, toileting and dressing States they require assistance with the following: None.    Any transportation issues/concerns?: no   Any patient concerns? Yes. Pt concerned about medication changes made at discharge. Several medications he cannot afford, would like to go over with them at OV to determine if any can be changed. 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    Confirmed importance and date/time of follow-up visits scheduled yes  Provider Appointment booked with PCP Friday, 01/23/17 @ 11am.   Confirmed with patient if condition begins to worsen call PCP or go to the ER.  Patient was given the office number and encouraged to call back with question or concerns.  : yes

## 2017-01-20 ENCOUNTER — Other Ambulatory Visit: Payer: Self-pay

## 2017-01-20 ENCOUNTER — Other Ambulatory Visit: Payer: Self-pay | Admitting: Pharmacist

## 2017-01-20 LAB — CULTURE, BLOOD (ROUTINE X 2)
Culture: NO GROWTH
Culture: NO GROWTH
SPECIAL REQUESTS: ADEQUATE
Special Requests: ADEQUATE

## 2017-01-20 NOTE — Patient Outreach (Signed)
[facility]) Care Management  01/20/2017  Scott Rollins 1951-02-24 [id number]  Subjective: none  Objective: none  Assessment: Referral received per [facility] pharmacist, Scott Rollins. Client recently admitted 7/5-7/7 for respiratory failure due to COPD/atrial fibrillation. RNCM called for transition of care. No answer. HIPPA compliant message left.   Plan: follow up call tomorrow.  Scott Silversmith, RN, MSN, [place] Coordinator Cell: [phone]

## 2017-01-20 NOTE — Patient Outreach (Addendum)
[facility]) Care Management  01/20/2017  Scott Rollins [birth year] [id number]   Called patient to follow up on medication assistance. Unfortunately, patient did not answer the phone.  HIPAA compliant message was left on his voicemail.  Dr. Rulon Sera office faxed the application for Symbicort back to the [facility]. Review of the discharge summary from 01/17/17 revealed Symbicort was discontinued. At discharge, patient was put on Brovana and Budesonide via nebulization.  Heather at Dr. Rulon Sera office was called for clarification. Nira Conn said the patient has an appointment for hospital follow up on 01/23/2017.  ADDENDUM  Patient called me back. HIPAA identifiers were obtained.  Patient's medications were reviewed via telephone with review of the most recent discharge summary:  Medications Reviewed Today    Reviewed by [name], [facility] (Pharmacist) on 01/20/17 at 1016  Med List Status: <None>  Medication Order Taking? Sig Documenting Provider Last Dose Status Informant  acetaminophen (TYLENOL) 500 MG tablet [id number] Yes Take 500 mg by mouth every 6 (six) hours as needed for mild pain.  [provider] Taking Active Self  albuterol (PROAIR HFA) 108 (90 Base) MCG/ACT inhaler [id number] Yes Inhale 2 puffs into the lungs every 6 (six) hours as needed for wheezing or shortness of breath. [provider] Taking Active   albuterol (PROVENTIL) (2.5 MG/3ML) 0.083% nebulizer solution [id number] Yes USE ONE VIAL IN NEBULIZER EVERY 4 HOURS AS NEEDED FOR SHORTNESS OF BREATH OR WHEEZING [provider] Taking Active Self  allopurinol (ZYLOPRIM) 300 MG tablet [id number] Yes Take 1 tablet (300 mg total) by mouth daily. [name], MD Taking Active Self  apixaban (ELIQUIS) 5 MG TABS tablet [id number] Yes Take 1 tablet (5 mg total) by mouth 2 (two) times daily. Hosie Poisson, MD Taking Active   arformoterol [facility]) 15 MCG/2ML NEBU [id number] No Take 2 mLs  (15 mcg total) by nebulization 2 (two) times daily.  Patient not taking:  Reported on 01/20/2017   Hosie Poisson, MD Not Taking Active            Med Note [name]   Tue Jan 20, 2017  9:54 AM) Too expensive  bisoprolol-hydrochlorothiazide [facility]) 5-6.25 MG tablet [id number] Yes Take 1 tablet by mouth every morning. [name], MD Taking Active Self  budesonide (PULMICORT) 0.25 MG/2ML nebulizer solution [id number] No Take 2 mLs (0.25 mg total) by nebulization 2 (two) times daily.  Patient not taking:  Reported on 01/20/2017   Hosie Poisson, MD Not Taking Active            Med Note [name]   Tue Jan 20, 2017  9:54 AM) Not taking too expensive  budesonide-formoterol (SYMBICORT) 160-4.5 MCG/ACT inhaler [id number] Yes Inhale 2 puffs into the lungs 2 (two) times daily. [provider] Taking Active Self  citalopram (CELEXA) 40 MG tablet [id number] Yes Take 0.5 tablets (20 mg total) by mouth at bedtime. Hosie Poisson, MD Taking Active   cyclobenzaprine (FLEXERIL) 10 MG tablet [id number] Yes Take 1 tablet (10 mg total) by mouth at bedtime. [name], [name], MD Taking Active Self  dextromethorphan-guaiFENesin [facility]) 30-600 MG 12hr tablet [id number] Yes Take 1 tablet by mouth 2 (two) times daily as needed for cough. [provider] Taking Active Self  diltiazem (CARDIZEM) 30 MG tablet [id number] No Take 1 tablet (30 mg total) by mouth every 12 (twelve) hours.  Patient not taking:  Reported on 01/20/2017   Hosie Poisson, MD Not Taking Active   doxycycline (VIBRAMYCIN)  50 MG capsule [id number] Yes Take 2 capsules (100 mg total) by mouth 2 (two) times daily. Hosie Poisson, MD Taking Active   feeding supplement, ENSURE ENLIVE, (ENSURE ENLIVE) LIQD [id number] Yes Take 237 mLs by mouth 2 (two) times daily between meals. Hosie Poisson, MD Taking Active   furosemide (LASIX) 40 MG tablet [id number] Yes Take 2 tablets (80 mg total) by mouth daily as needed.  Patient taking  differently:  Take 80 mg by mouth daily as needed for fluid.    [name], MD Taking Active Self           Med Note Dara Lords, Dorothe Pea Jan 15, 2017  7:18 AM)    gabapentin (NEURONTIN) 300 MG capsule [id number] Yes Take 300 mg by mouth at bedtime.  [provider] Taking Active Self  ipratropium-albuterol (DUONEB) 0.5-2.5 (3) MG/3ML SOLN [id number] No Take 3 mLs by nebulization every 6 (six) hours as needed.  Patient not taking:  Reported on 01/20/2017   Hosie Poisson, MD Not Taking Active   lisinopril (PRINIVIL,ZESTRIL) 20 MG tablet [id number] Yes TAKE ONE TABLET BY MOUTH TWICE DAILY  Patient taking differently:  TAKE ONE TABLET BY MOUTH DAILY   [name], [name], MD Taking Active Self  predniSONE (DELTASONE) 10 MG tablet [id number] Yes Prednisone 60 mg daily for 3 days followed by  Prednisone 40 mg daily for 3 days followed by  Prednisone 20 mg daily for 3 days followed by Prednisone 10 mg daily for 3 days. Hosie Poisson, MD Taking Active   Probiotic Product (PROBIOTIC PO) [id number] Yes Take 1 capsule by mouth daily. [provider] Taking Active Self  zolpidem (AMBIEN) 10 MG tablet [id number] Yes Take 1 tablet (10 mg total) by mouth at bedtime as needed for sleep. [name], [name], MD Taking Active Self           Medication Review Findings:  Adherence: 1.  Brovana & Budesonide- patient did not get these medications due to cost.  He reported receiving three Symbicort inhalers from [facility] when he got home from the hospital and has been using Symbicort despite it being discontinued at discharge.  2. Furosemide-patient reported not taking but after being educated said he would have someone go and pick up the prescriptions today  3.  Prednisone taper-patient reported not taking the prednisone taper because he said "I just finished taking prednisone a couple of weeks ago".  Patient was educated on how prednisone helps with COPD exacerbation and he said he  would pick up the prednisone today as well.   4.  Doxycycline-patient did not have the doxycyline filled but said his daughter had some doxycycline 100mg  tablets that he was taking.  Patient reported taking Doxycyline 100mg  1 tablet at bedtime.  He was educated that the dose should be Doxycyline 100mg  twice daily (Doxycycline 50mg  2 capsules twice daily is what was actually prescribed). Patient was educated on the correct dose.   5.  Cardizem-patient did not have filled.  Education provided-patient said he would pick up today.  6.  Eliquis-patient expressed concern about Eliquis. He reported feeling short of breath and fatigued. He said he felt like Eliquis was causing these symptoms and was considering stopping Eliquis.  Patient was provided extensive education on Eliquis and strongly encouraged to continue to take it as prescribed. He was encouraged to call his provider if his symptoms worsened. He was also reminded about symptoms of CHF and how not taking furosemide, cardizem or prednisone as prescribed  could be playing into him not feeling well vs side effects from Eliquis.  Medication Assistance Findings:  Symbicort-patient reported [employer] sent him a temporary supply of Symbicort and sent paper work to be completed and sent back to them.  Patient was instructed to discuss Symbicort with his provider at his hospital follow up on 01/23/17.   Patient will need Patient Assistance Paperwork for Eliquis as well.  Plan:  I will follow up with the patient after his 01/23/17 appointment.  Eliquis paperwork will be sent to Dr. Rulon Sera office  Recommend patient for CHF & COPD management and education.   [name], PharmD, [facility] Clinical Pharmacist [phone]

## 2017-01-21 ENCOUNTER — Encounter (HOSPITAL_COMMUNITY): Payer: Self-pay | Admitting: *Deleted

## 2017-01-21 ENCOUNTER — Other Ambulatory Visit: Payer: Self-pay

## 2017-01-21 ENCOUNTER — Telehealth: Payer: Self-pay | Admitting: *Deleted

## 2017-01-21 ENCOUNTER — Inpatient Hospital Stay (HOSPITAL_COMMUNITY)
Admission: EM | Admit: 2017-01-21 | Discharge: 2017-01-25 | DRG: 189 | Disposition: A | Discharge status: Home / Self Care | Payer: Medicare Other | Attending: Internal Medicine | Admitting: Internal Medicine

## 2017-01-21 ENCOUNTER — Emergency Department (HOSPITAL_COMMUNITY): Payer: Medicare Other

## 2017-01-21 DIAGNOSIS — E669 Obesity, unspecified: Secondary | ICD-10-CM | POA: Diagnosis present

## 2017-01-21 DIAGNOSIS — G4733 Obstructive sleep apnea (adult) (pediatric): Secondary | ICD-10-CM | POA: Diagnosis not present

## 2017-01-21 DIAGNOSIS — I2510 Atherosclerotic heart disease of native coronary artery without angina pectoris: Secondary | ICD-10-CM | POA: Diagnosis present

## 2017-01-21 DIAGNOSIS — Z888 Allergy status to other drugs, medicaments and biological substances status: Secondary | ICD-10-CM

## 2017-01-21 DIAGNOSIS — R52 Pain, unspecified: Secondary | ICD-10-CM

## 2017-01-21 DIAGNOSIS — E872 Acidosis: Secondary | ICD-10-CM | POA: Diagnosis present

## 2017-01-21 DIAGNOSIS — C61 Malignant neoplasm of prostate: Secondary | ICD-10-CM | POA: Diagnosis present

## 2017-01-21 DIAGNOSIS — J9620 Acute and chronic respiratory failure, unspecified whether with hypoxia or hypercapnia: Secondary | ICD-10-CM | POA: Diagnosis present

## 2017-01-21 DIAGNOSIS — R0902 Hypoxemia: Secondary | ICD-10-CM | POA: Diagnosis not present

## 2017-01-21 DIAGNOSIS — I252 Old myocardial infarction: Secondary | ICD-10-CM

## 2017-01-21 DIAGNOSIS — Z6841 Body Mass Index (BMI) 40.0 and over, adult: Secondary | ICD-10-CM

## 2017-01-21 DIAGNOSIS — R9389 Abnormal findings on diagnostic imaging of other specified body structures: Secondary | ICD-10-CM | POA: Diagnosis present

## 2017-01-21 DIAGNOSIS — M160 Bilateral primary osteoarthritis of hip: Secondary | ICD-10-CM | POA: Diagnosis present

## 2017-01-21 DIAGNOSIS — Z8701 Personal history of pneumonia (recurrent): Secondary | ICD-10-CM

## 2017-01-21 DIAGNOSIS — D72829 Elevated white blood cell count, unspecified: Secondary | ICD-10-CM | POA: Diagnosis present

## 2017-01-21 DIAGNOSIS — Z818 Family history of other mental and behavioral disorders: Secondary | ICD-10-CM

## 2017-01-21 DIAGNOSIS — Z79899 Other long term (current) drug therapy: Secondary | ICD-10-CM

## 2017-01-21 DIAGNOSIS — M899 Disorder of bone, unspecified: Secondary | ICD-10-CM

## 2017-01-21 DIAGNOSIS — Z7901 Long term (current) use of anticoagulants: Secondary | ICD-10-CM

## 2017-01-21 DIAGNOSIS — N529 Male erectile dysfunction, unspecified: Secondary | ICD-10-CM | POA: Diagnosis present

## 2017-01-21 DIAGNOSIS — Z87891 Personal history of nicotine dependence: Secondary | ICD-10-CM

## 2017-01-21 DIAGNOSIS — G894 Chronic pain syndrome: Secondary | ICD-10-CM | POA: Diagnosis present

## 2017-01-21 DIAGNOSIS — G893 Neoplasm related pain (acute) (chronic): Secondary | ICD-10-CM | POA: Diagnosis present

## 2017-01-21 DIAGNOSIS — J9622 Acute and chronic respiratory failure with hypercapnia: Principal | ICD-10-CM | POA: Diagnosis present

## 2017-01-21 DIAGNOSIS — Z9981 Dependence on supplemental oxygen: Secondary | ICD-10-CM

## 2017-01-21 DIAGNOSIS — M479 Spondylosis, unspecified: Secondary | ICD-10-CM | POA: Diagnosis present

## 2017-01-21 DIAGNOSIS — E662 Morbid (severe) obesity with alveolar hypoventilation: Secondary | ICD-10-CM | POA: Diagnosis not present

## 2017-01-21 DIAGNOSIS — J9690 Respiratory failure, unspecified, unspecified whether with hypoxia or hypercapnia: Secondary | ICD-10-CM

## 2017-01-21 DIAGNOSIS — M1A9XX0 Chronic gout, unspecified, without tophus (tophi): Secondary | ICD-10-CM | POA: Diagnosis present

## 2017-01-21 DIAGNOSIS — R938 Abnormal findings on diagnostic imaging of other specified body structures: Secondary | ICD-10-CM

## 2017-01-21 DIAGNOSIS — R079 Chest pain, unspecified: Secondary | ICD-10-CM | POA: Diagnosis not present

## 2017-01-21 DIAGNOSIS — I4891 Unspecified atrial fibrillation: Secondary | ICD-10-CM | POA: Diagnosis present

## 2017-01-21 DIAGNOSIS — I480 Paroxysmal atrial fibrillation: Secondary | ICD-10-CM | POA: Diagnosis present

## 2017-01-21 DIAGNOSIS — R739 Hyperglycemia, unspecified: Secondary | ICD-10-CM | POA: Diagnosis present

## 2017-01-21 DIAGNOSIS — M170 Bilateral primary osteoarthritis of knee: Secondary | ICD-10-CM | POA: Diagnosis present

## 2017-01-21 DIAGNOSIS — Z86010 Personal history of colonic polyps: Secondary | ICD-10-CM

## 2017-01-21 DIAGNOSIS — J449 Chronic obstructive pulmonary disease, unspecified: Secondary | ICD-10-CM

## 2017-01-21 DIAGNOSIS — F419 Anxiety disorder, unspecified: Secondary | ICD-10-CM | POA: Diagnosis present

## 2017-01-21 DIAGNOSIS — J441 Chronic obstructive pulmonary disease with (acute) exacerbation: Secondary | ICD-10-CM | POA: Diagnosis present

## 2017-01-21 DIAGNOSIS — I10 Essential (primary) hypertension: Secondary | ICD-10-CM | POA: Diagnosis present

## 2017-01-21 DIAGNOSIS — F329 Major depressive disorder, single episode, unspecified: Secondary | ICD-10-CM | POA: Diagnosis present

## 2017-01-21 DIAGNOSIS — Z7951 Long term (current) use of inhaled steroids: Secondary | ICD-10-CM

## 2017-01-21 DIAGNOSIS — G40909 Epilepsy, unspecified, not intractable, without status epilepticus: Secondary | ICD-10-CM | POA: Diagnosis present

## 2017-01-21 DIAGNOSIS — E785 Hyperlipidemia, unspecified: Secondary | ICD-10-CM | POA: Diagnosis present

## 2017-01-21 DIAGNOSIS — Z9119 Patient's noncompliance with other medical treatment and regimen: Secondary | ICD-10-CM | POA: Diagnosis not present

## 2017-01-21 DIAGNOSIS — Z955 Presence of coronary angioplasty implant and graft: Secondary | ICD-10-CM

## 2017-01-21 DIAGNOSIS — Z8261 Family history of arthritis: Secondary | ICD-10-CM

## 2017-01-21 DIAGNOSIS — F1290 Cannabis use, unspecified, uncomplicated: Secondary | ICD-10-CM | POA: Diagnosis present

## 2017-01-21 DIAGNOSIS — R0602 Shortness of breath: Secondary | ICD-10-CM | POA: Diagnosis not present

## 2017-01-21 DIAGNOSIS — C7951 Secondary malignant neoplasm of bone: Secondary | ICD-10-CM | POA: Diagnosis not present

## 2017-01-21 DIAGNOSIS — Z9221 Personal history of antineoplastic chemotherapy: Secondary | ICD-10-CM

## 2017-01-21 DIAGNOSIS — I2583 Coronary atherosclerosis due to lipid rich plaque: Secondary | ICD-10-CM | POA: Diagnosis present

## 2017-01-21 DIAGNOSIS — Z8249 Family history of ischemic heart disease and other diseases of the circulatory system: Secondary | ICD-10-CM

## 2017-01-21 DIAGNOSIS — N179 Acute kidney failure, unspecified: Secondary | ICD-10-CM | POA: Diagnosis not present

## 2017-01-21 DIAGNOSIS — J9621 Acute and chronic respiratory failure with hypoxia: Secondary | ICD-10-CM

## 2017-01-21 DIAGNOSIS — I341 Nonrheumatic mitral (valve) prolapse: Secondary | ICD-10-CM | POA: Diagnosis present

## 2017-01-21 HISTORY — DX: Secondary malignant neoplasm of unspecified site: C79.9

## 2017-01-21 HISTORY — DX: Malignant neoplasm of prostate: C61

## 2017-01-21 LAB — CBC WITH DIFFERENTIAL/PLATELET
BASOS ABS: 0 10*3/uL (ref 0.0–0.1)
Basophils Relative: 0 %
EOS PCT: 1 %
Eosinophils Absolute: 0.2 10*3/uL (ref 0.0–0.7)
HEMATOCRIT: 46.4 % (ref 39.0–52.0)
HEMOGLOBIN: 14.4 g/dL (ref 13.0–17.0)
LYMPHS PCT: 13 %
Lymphs Abs: 3 10*3/uL (ref 0.7–4.0)
MCH: 28 pg (ref 26.0–34.0)
MCHC: 31 g/dL (ref 30.0–36.0)
MCV: 90.3 fL (ref 78.0–100.0)
MONOS PCT: 4 %
Monocytes Absolute: 0.9 10*3/uL (ref 0.1–1.0)
NEUTROS ABS: 18.6 10*3/uL — AB (ref 1.7–7.7)
Neutrophils Relative %: 82 %
Platelets: 323 10*3/uL (ref 150–400)
RBC: 5.14 MIL/uL (ref 4.22–5.81)
RDW: 14.5 % (ref 11.5–15.5)
WBC: 22.7 10*3/uL — AB (ref 4.0–10.5)

## 2017-01-21 LAB — BLOOD GAS, ARTERIAL
Acid-Base Excess: 5.9 mmol/L — ABNORMAL HIGH (ref 0.0–2.0)
Bicarbonate: 32.4 mmol/L — ABNORMAL HIGH (ref 20.0–28.0)
DELIVERY SYSTEMS: POSITIVE
DRAWN BY: 270271
Expiratory PAP: 5
FIO2: 30
Inspiratory PAP: 12
Mode: POSITIVE
O2 Saturation: 93.3 %
PCO2 ART: 72.3 mmHg — AB (ref 32.0–48.0)
PO2 ART: 76.3 mmHg — AB (ref 83.0–108.0)
Patient temperature: 98.6
RATE: 16 resp/min
pH, Arterial: 7.274 — ABNORMAL LOW (ref 7.350–7.450)

## 2017-01-21 LAB — URINALYSIS, ROUTINE W REFLEX MICROSCOPIC
Bilirubin Urine: NEGATIVE
GLUCOSE, UA: NEGATIVE mg/dL
Ketones, ur: NEGATIVE mg/dL
LEUKOCYTES UA: NEGATIVE
NITRITE: NEGATIVE
PH: 5 (ref 5.0–8.0)
Protein, ur: NEGATIVE mg/dL
SPECIFIC GRAVITY, URINE: 1.028 (ref 1.005–1.030)

## 2017-01-21 LAB — BASIC METABOLIC PANEL
ANION GAP: 12 (ref 5–15)
BUN: 22 mg/dL — AB (ref 6–20)
CHLORIDE: 97 mmol/L — AB (ref 101–111)
CO2: 29 mmol/L (ref 22–32)
Calcium: 8.6 mg/dL — ABNORMAL LOW (ref 8.9–10.3)
Creatinine, Ser: 1.42 mg/dL — ABNORMAL HIGH (ref 0.61–1.24)
GFR calc Af Amer: 58 mL/min — ABNORMAL LOW (ref >60)
GFR, EST NON AFRICAN AMERICAN: 50 mL/min — AB (ref >60)
GLUCOSE: 112 mg/dL — AB (ref 65–99)
POTASSIUM: 4.3 mmol/L (ref 3.5–5.1)
Sodium: 138 mmol/L (ref 135–145)

## 2017-01-21 LAB — I-STAT ARTERIAL BLOOD GAS, ED
ACID-BASE EXCESS: 4 mmol/L — AB (ref 0.0–2.0)
BICARBONATE: 35.7 mmol/L — AB (ref 20.0–28.0)
O2 SAT: 96 %
PO2 ART: 102 mmHg (ref 83.0–108.0)
TCO2: 38 mmol/L (ref 0–100)
pCO2 arterial: 86.9 mmHg (ref 32.0–48.0)
pH, Arterial: 7.22 — ABNORMAL LOW (ref 7.350–7.450)

## 2017-01-21 LAB — I-STAT TROPONIN, ED
TROPONIN I, POC: 0 ng/mL (ref 0.00–0.08)
Troponin i, poc: 0 ng/mL (ref 0.00–0.08)

## 2017-01-21 LAB — BRAIN NATRIURETIC PEPTIDE: B Natriuretic Peptide: 55.1 pg/mL (ref 0.0–100.0)

## 2017-01-21 LAB — PSA: Prostatic Specific Antigen: 138 ng/mL — ABNORMAL HIGH (ref 0.00–4.00)

## 2017-01-21 MED ORDER — ONDANSETRON HCL 4 MG PO TABS: 4.0000 mg | ORAL_TABLET | Freq: Four times a day (QID) | ORAL | Status: DC | PRN

## 2017-01-21 MED ORDER — SODIUM CHLORIDE 0.9% FLUSH
3.0000 mL | Freq: Two times a day (BID) | INTRAVENOUS | Status: DC
  Administered 2017-01-22 – 2017-01-25 (×6): 3 mL via INTRAVENOUS

## 2017-01-21 MED ORDER — ENSURE ENLIVE PO LIQD
237.0000 mL | Freq: Two times a day (BID) | ORAL | Status: DC
  Administered 2017-01-22 – 2017-01-25 (×7): 237 mL via ORAL

## 2017-01-21 MED ORDER — ACETAMINOPHEN 500 MG PO TABS: 500.0000 mg | ORAL_TABLET | Freq: Four times a day (QID) | ORAL | Status: DC | PRN

## 2017-01-21 MED ORDER — ONDANSETRON HCL 4 MG/2ML IJ SOLN: 4.0000 mg | Freq: Four times a day (QID) | INTRAMUSCULAR | Status: DC | PRN

## 2017-01-21 MED ORDER — HYDROCODONE-ACETAMINOPHEN 5-325 MG PO TABS
1.0000 | ORAL_TABLET | ORAL | Status: DC | PRN
  Administered 2017-01-22 – 2017-01-24 (×5): 2 via ORAL
  Filled 2017-01-21 (×6): qty 2

## 2017-01-21 MED ORDER — MORPHINE SULFATE (PF) 4 MG/ML IV SOLN
6.0000 mg | Freq: Once | INTRAVENOUS | Status: AC
  Administered 2017-01-21: 6 mg via INTRAVENOUS
  Filled 2017-01-21: qty 2

## 2017-01-21 MED ORDER — IOPAMIDOL (ISOVUE-370) INJECTION 76%
INTRAVENOUS | Status: AC
  Administered 2017-01-21: 100 mL
  Filled 2017-01-21: qty 100

## 2017-01-21 MED ORDER — ALBUTEROL SULFATE (2.5 MG/3ML) 0.083% IN NEBU: 3.0000 mL | INHALATION_SOLUTION | Freq: Four times a day (QID) | RESPIRATORY_TRACT | Status: DC | PRN

## 2017-01-21 MED ORDER — APIXABAN 5 MG PO TABS
5.0000 mg | ORAL_TABLET | Freq: Two times a day (BID) | ORAL | Status: DC
  Administered 2017-01-21 – 2017-01-25 (×8): 5 mg via ORAL
  Filled 2017-01-21 (×8): qty 1

## 2017-01-21 MED ORDER — ALBUTEROL SULFATE (2.5 MG/3ML) 0.083% IN NEBU
2.5000 mg | INHALATION_SOLUTION | RESPIRATORY_TRACT | Status: DC
  Administered 2017-01-21 – 2017-01-22 (×4): 2.5 mg via RESPIRATORY_TRACT
  Filled 2017-01-21 (×4): qty 3

## 2017-01-21 MED ORDER — BISOPROLOL-HYDROCHLOROTHIAZIDE 5-6.25 MG PO TABS
1.0000 | ORAL_TABLET | Freq: Every morning | ORAL | Status: DC
  Administered 2017-01-22 – 2017-01-25 (×4): 1 via ORAL
  Filled 2017-01-21 (×4): qty 1

## 2017-01-21 MED ORDER — FUROSEMIDE 80 MG PO TABS: 80.0000 mg | ORAL_TABLET | Freq: Every day | ORAL | Status: DC | PRN

## 2017-01-21 MED ORDER — ONDANSETRON HCL 4 MG/2ML IJ SOLN
4.0000 mg | Freq: Once | INTRAMUSCULAR | Status: AC
  Administered 2017-01-21: 4 mg via INTRAVENOUS
  Filled 2017-01-21: qty 2

## 2017-01-21 MED ORDER — SODIUM CHLORIDE 0.9 % IV SOLN
INTRAVENOUS | Status: DC
  Administered 2017-01-21: 19:00:00 via INTRAVENOUS

## 2017-01-21 MED ORDER — ACETAMINOPHEN 325 MG PO TABS: 650.0000 mg | ORAL_TABLET | Freq: Four times a day (QID) | ORAL | Status: DC | PRN

## 2017-01-21 MED ORDER — LEVOFLOXACIN IN D5W 750 MG/150ML IV SOLN
750.0000 mg | INTRAVENOUS | Status: DC
  Administered 2017-01-22 – 2017-01-24 (×3): 750 mg via INTRAVENOUS
  Filled 2017-01-21 (×4): qty 150

## 2017-01-21 MED ORDER — DILTIAZEM HCL 30 MG PO TABS
30.0000 mg | ORAL_TABLET | Freq: Two times a day (BID) | ORAL | Status: DC
  Administered 2017-01-21 – 2017-01-25 (×8): 30 mg via ORAL
  Filled 2017-01-21 (×8): qty 1

## 2017-01-21 MED ORDER — ZOLPIDEM TARTRATE 5 MG PO TABS
5.0000 mg | ORAL_TABLET | Freq: Every evening | ORAL | Status: DC | PRN
  Administered 2017-01-23 – 2017-01-24 (×2): 5 mg via ORAL
  Filled 2017-01-21 (×2): qty 1

## 2017-01-21 MED ORDER — LEVOFLOXACIN IN D5W 750 MG/150ML IV SOLN
750.0000 mg | Freq: Once | INTRAVENOUS | Status: AC
  Administered 2017-01-21: 750 mg via INTRAVENOUS
  Filled 2017-01-21: qty 150

## 2017-01-21 MED ORDER — IPRATROPIUM-ALBUTEROL 0.5-2.5 (3) MG/3ML IN SOLN
3.0000 mL | Freq: Four times a day (QID) | RESPIRATORY_TRACT | Status: DC
  Administered 2017-01-21: 3 mL via RESPIRATORY_TRACT
  Filled 2017-01-21: qty 3

## 2017-01-21 MED ORDER — DM-GUAIFENESIN ER 30-600 MG PO TB12: 1.0000 | ORAL_TABLET | Freq: Two times a day (BID) | ORAL | Status: DC | PRN

## 2017-01-21 MED ORDER — METHYLPREDNISOLONE SODIUM SUCC 125 MG IJ SOLR
60.0000 mg | Freq: Three times a day (TID) | INTRAMUSCULAR | Status: DC
  Administered 2017-01-21 – 2017-01-22 (×2): 60 mg via INTRAVENOUS
  Filled 2017-01-21 (×2): qty 2

## 2017-01-21 MED ORDER — GABAPENTIN 300 MG PO CAPS
300.0000 mg | ORAL_CAPSULE | Freq: Every day | ORAL | Status: DC
  Administered 2017-01-21 – 2017-01-23 (×3): 300 mg via ORAL
  Filled 2017-01-21 (×3): qty 1

## 2017-01-21 MED ORDER — BUDESONIDE 0.5 MG/2ML IN SUSP
0.5000 mg | Freq: Two times a day (BID) | RESPIRATORY_TRACT | Status: DC
Start: 2017-01-21 — End: 2017-01-25
  Administered 2017-01-21 – 2017-01-24 (×7): 0.5 mg via RESPIRATORY_TRACT
  Filled 2017-01-21 (×8): qty 2

## 2017-01-21 MED ORDER — MOMETASONE FURO-FORMOTEROL FUM 200-5 MCG/ACT IN AERO
2.0000 | INHALATION_SPRAY | Freq: Two times a day (BID) | RESPIRATORY_TRACT | Status: DC
  Administered 2017-01-22: 2 via RESPIRATORY_TRACT
  Filled 2017-01-21: qty 8.8

## 2017-01-21 MED ORDER — DIPHENHYDRAMINE HCL 50 MG/ML IJ SOLN
25.0000 mg | Freq: Once | INTRAMUSCULAR | Status: AC
  Administered 2017-01-21: 25 mg via INTRAVENOUS
  Filled 2017-01-21: qty 1

## 2017-01-21 MED ORDER — ALLOPURINOL 300 MG PO TABS
300.0000 mg | ORAL_TABLET | Freq: Every day | ORAL | Status: DC
  Administered 2017-01-22 – 2017-01-25 (×4): 300 mg via ORAL
  Filled 2017-01-21 (×4): qty 1

## 2017-01-21 MED ORDER — ACETAMINOPHEN 650 MG RE SUPP: 650.0000 mg | Freq: Four times a day (QID) | RECTAL | Status: DC | PRN

## 2017-01-21 MED ORDER — CITALOPRAM HYDROBROMIDE 20 MG PO TABS
20.0000 mg | ORAL_TABLET | Freq: Every day | ORAL | Status: DC
  Administered 2017-01-21 – 2017-01-24 (×4): 20 mg via ORAL
  Filled 2017-01-21 (×4): qty 1

## 2017-01-21 MED ORDER — SODIUM CHLORIDE 0.9 % IV BOLUS (SEPSIS)
500.0000 mL | Freq: Once | INTRAVENOUS | Status: AC
  Administered 2017-01-21: 500 mL via INTRAVENOUS

## 2017-01-21 NOTE — ED Notes (Signed)
Admitting at bedside 

## 2017-01-21 NOTE — Telephone Encounter (Signed)
Pt called stating that he has sever pain in his "kidney area, like someone hit him with a ball bat". Pt stated he thinks its from the new medications he was put on in the hospital. Pt stated that his BP was 206/105 this morning, pt checked BP while on phone with me and it was 181/115 HR 73. Pt stated that he did not have any chest pain, blurred visions or n/v, but did have shortness of breath and HA.   SW Dr. Anitra Lauth and advised him of information above. Dr. Anitra Lauth recommended that pt go to ER.   Pt advised and voiced understanding. Pt stated that he will call someone to take him to the ER.

## 2017-01-21 NOTE — ED Notes (Signed)
Iv team at bedside  

## 2017-01-21 NOTE — ED Notes (Signed)
Attempted report x1. 

## 2017-01-21 NOTE — ED Provider Notes (Addendum)
[facility] DEPT Provider Note   CSN: [id number] Arrival date & time: 01/21/17  1006     History   Chief Complaint Chief Complaint  Patient presents with  . Shortness of Breath  . Urinary Retention    HPI Scott Rollins is a 66 y.o. male.  HPI   66 year old male with history of COPD noncompliant with treatment, CAD status post PCI with stenting, paroxysmal atrial fibrillation presenting with shortness of breath and back pain. Patient report he developed acute onset of sharp pain to both side of his mid back that started this morning and has persistence. He did have some vague chest pain initially but that has since resolved. He feels that the back pain is located in his "kidneys" he reported been diaphoretic. Report increased shortness of breath but related to the current pain. He has never had this pain before. He denies having fever, lightheadedness, dizziness, productive cough, dysuria. He urinated earlier today, that was normal, and states he does not have any desire to urinate at this time. Denies history of AAA.  He is currently on Eliquis.  Pt is on home O2 at 4L. No hx of kidney stone. Pt received 324mg  of ASA and 3SL Nitro PTA which did improve his chest/back pain.  Patient was seen in ER on July 5 for shortness of breath, subsequently was admitted for acute respiratory failure with hypoxia secondary to acute COPD exacerbation. He is then hospital for 2 days and was discharged home.   Past Medical History:  Diagnosis Date  . Anginal pain ([place])   . Anxiety   . Arthritis    "hips, knees, thoracic back" (01/15/2017)  . CAP (community acquired pneumonia) 11/2016  . Colon polyp 2006   Scott Rollins; Scott Rollins; consider repeat in 5 years  . COPD (chronic obstructive pulmonary disease) ([place])    "pearl study" pt thinks he is on Symbicort; pt noncompliant with COPD controller meds on/off due to financial reasons.  . Coronary atherosclerosis of unspecified type of vessel,  native or graft 2005   MI, s/p PCI with stent (pt reports 20+ interventions in the past, most recent cat 6/08 showed patent stents).  Normal LV function.  Nuclear stress test NEG 8/09.  . Depression    ?bipolar dx by psychiatrist?  . Diverticulosis 2006  . ED (erectile dysfunction)   . Gout   . Gouty arthritis    Dr. Amil Amen  . Heart murmur   . Hematochezia 09/2012   Hyperplastic polyp, diverticulosis, and internal hemorrhoids found on colonoscopy 10/2012  . History of hiatal hernia   . Hyperlipidemia    Hx of multiple statin intolerance  . Hypertension   . 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 Migraine    "none in the 2000s" (01/15/2017)  . Mitral valve prolapse ~ 1981  . Myocardial infarction [facility]) ~ 1993- 2005 X 3-4   (01/15/2017)  . Neuropathic pain of both feet 2015/2016   Vit B12 and A1c normal 10/2014  . Obesities, morbid ([place])   . On home oxygen therapy    "2L prn" (01/15/2017)  . OSA on CPAP    w/oxygen (01/15/2017)  . Osteoarthrosis, unspecified whether generalized or localized, unspecified site    DDD  . PAF (paroxysmal atrial fibrillation) ([place]) 12/2016   Started in the context of resp illness/lots of bronchodilators.  Started eliquis and cardizem during hospitalization 01/2017 (Dr. Rayann Heman).  . Pre-diabetes 2016/17  . Prostate cancer (Scott Rollins) 09/2010   Localized, high risk disease (  Dr. Kimbrough/Grapey):  s/p 5 wks ext bm rad + seed boost, as well as hormone blockade x 1 yr.  Biochemical recurrence 11/2015--urologist did bone scan and spots on T spine and L iliac region came up, pt needs plain films of pelvis/T spine to see if DJD correlate present.  He'll eventually need to resume androgen depriv therapy.  . Radiation    Pelvic--for prostate ca: 25 treatments/01/2011  . Seizure disorder (Scott Rollins) 08-15-2011   Deja vu sensations: no w/u.  These stopped when neurontin 300mg  tid was started for a different reason.  . Status post chemotherapy    10/2010 thru 06/2011     Patient Active Problem List   Diagnosis Date Noted  . SOB (shortness of breath) 01/16/2017  . Atrial fibrillation with RVR (Scott Rollins) 01/15/2017  . Community acquired pneumonia of right lower lobe of lung (Scott Rollins) 11/05/2016  . Hyponatremia 11/05/2016  . OSA (obstructive sleep apnea) 11/05/2016  . COPD with acute exacerbation (Scott Rollins) 11/04/2016  . 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 COPD (chronic obstructive pulmonary disease) ([place]) 04/19/2014  . Chronic pain syndrome 01/11/2014  . Chronic gout 04/22/2013  . Health maintenance examination 04/29/2012  . Prostate cancer (Scott Rollins) 09/12/2010  . MIXED HYPERLIPIDEMIA 08/27/2010  . ELEVATED PROSTATE SPECIFIC ANTIGEN 08/26/2010  . 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 HEMATURIA, HX OF 08/21/2010  . TOBACCO ABUSE 07/11/2010  . NUMMULAR ECZEMA 06/24/2010  . EMPHYSEMA 04/16/2009  . DYSPNEA 04/10/2009  . OBESITY 04/09/2009  . Essential hypertension 04/09/2009  . Coronary artery disease due to lipid rich plaque 04/09/2009  . DEGENERATIVE JOINT DISEASE 04/09/2009    Past Surgical History:  Procedure Laterality Date  . CARDIAC CATHETERIZATION  23 caths  . COLONOSCOPY WITH PROPOFOL N/A 04/05/2013   Scott Rollins Plan.  Polypectomy (hyperplastic--recall 10 yrs).  Moderate diverticulosis, +internal hemorrhoids.  No radiation proctitis.  . CORONARY ANGIOPLASTY    . CORONARY ANGIOPLASTY WITH STENT PLACEMENT     "5-6 stents" (01/15/2017)  . HERNIA REPAIR    . HOT HEMOSTASIS N/A 04/05/2013   Procedure: HOT HEMOSTASIS (ARGON PLASMA COAGULATION/BICAP);  Surgeon: Scott Artist, MD;  Location: [facility];  Service: Endoscopy;  Laterality: N/A;  . INSERTION PROSTATE RADIATION SEED  02/24/2011  . TRANSTHORACIC ECHOCARDIOGRAM  12/2016   EF 55-60%, moderate LVH, grd I DD, mild LA dilation.  Scott Rollins UMBILICAL HERNIA REPAIR         Home Medications    Prior to Admission medications   Medication Sig Start Date End Date Taking? Authorizing Provider  acetaminophen  (TYLENOL) 500 MG tablet Take 500 mg by mouth every 6 (six) hours as needed for mild pain.     [provider]  albuterol (PROAIR HFA) 108 (90 Base) MCG/ACT inhaler Inhale 2 puffs into the lungs every 6 (six) hours as needed for wheezing or shortness of breath.    [provider]  albuterol (PROVENTIL) (2.5 MG/3ML) 0.083% nebulizer solution USE ONE VIAL IN NEBULIZER EVERY 4 HOURS AS NEEDED FOR SHORTNESS OF BREATH OR WHEEZING    [provider]  allopurinol (ZYLOPRIM) 300 MG tablet Take 1 tablet (300 mg total) by mouth daily. 12/17/16   [name], [name], MD  apixaban (ELIQUIS) 5 MG TABS tablet Take 1 tablet (5 mg total) by mouth 2 (two) times daily. 01/17/17   Hosie Poisson, MD  arformoterol (BROVANA) 15 MCG/2ML NEBU Take 2 mLs (15 mcg total) by nebulization 2 (two) times daily. Patient not taking: Reported on 01/20/2017 01/17/17   Hosie Poisson, MD  bisoprolol-hydrochlorothiazide (  ZIAC) 5-6.25 MG tablet Take 1 tablet by mouth every morning. 12/17/16   [name], [name], MD  budesonide (PULMICORT) 0.25 MG/2ML nebulizer solution Take 2 mLs (0.25 mg total) by nebulization 2 (two) times daily. Patient not taking: Reported on 01/20/2017 01/17/17   Hosie Poisson, MD  budesonide-formoterol [facility]) 160-4.5 MCG/ACT inhaler Inhale 2 puffs into the lungs 2 (two) times daily.    [provider]  citalopram (CELEXA) 40 MG tablet Take 0.5 tablets (20 mg total) by mouth at bedtime. 01/17/17   Hosie Poisson, MD  cyclobenzaprine (FLEXERIL) 10 MG tablet Take 1 tablet (10 mg total) by mouth at bedtime. 12/17/16   [name], [name], MD  dextromethorphan-guaiFENesin (MUCINEX DM) 30-600 MG 12hr tablet Take 1 tablet by mouth 2 (two) times daily as needed for cough.    [provider]  diltiazem (CARDIZEM) 30 MG tablet Take 1 tablet (30 mg total) by mouth every 12 (twelve) hours. Patient not taking: Reported on 01/20/2017 01/17/17   Hosie Poisson, MD  doxycycline (VIBRAMYCIN) 50 MG capsule Take  2 capsules (100 mg total) by mouth 2 (two) times daily. 01/17/17 01/22/17  Hosie Poisson, MD  feeding supplement, ENSURE ENLIVE, (ENSURE ENLIVE) LIQD Take 237 mLs by mouth 2 (two) times daily between meals. 01/17/17   Hosie Poisson, MD  furosemide (LASIX) 40 MG tablet Take 2 tablets (80 mg total) by mouth daily as needed. Patient taking differently: Take 80 mg by mouth daily as needed for fluid.  12/10/16   [name], [name], MD  gabapentin (NEURONTIN) 300 MG capsule Take 300 mg by mouth at bedtime.     [provider]  ipratropium-albuterol (DUONEB) 0.5-2.5 (3) MG/3ML SOLN Take 3 mLs by nebulization every 6 (six) hours as needed. Patient not taking: Reported on 01/20/2017 01/17/17   Hosie Poisson, MD  lisinopril (PRINIVIL,ZESTRIL) 20 MG tablet TAKE ONE TABLET BY MOUTH TWICE DAILY Patient taking differently: TAKE ONE TABLET BY MOUTH DAILY 12/31/16   [name], [name], MD  predniSONE (DELTASONE) 10 MG tablet Prednisone 60 mg daily for 3 days followed by  Prednisone 40 mg daily for 3 days followed by  Prednisone 20 mg daily for 3 days followed by Prednisone 10 mg daily for 3 days. 01/17/17   Hosie Poisson, MD  Probiotic Product (PROBIOTIC PO) Take 1 capsule by mouth daily.    [provider]  zolpidem (AMBIEN) 10 MG tablet Take 1 tablet (10 mg total) by mouth at bedtime as needed for sleep. 12/17/16   [name], [name], MD    Family History Family History  Problem Relation Age of Onset  . Arthritis Mother   . Heart disease Father   . Bipolar disorder Father     Social History Social History  Substance Use Topics  . Smoking status: Former Smoker    Packs/day: 1.00    Years: 54.00    Types: Cigarettes    Quit date: 12/31/2016  . Smokeless tobacco: Never Used     Comment: 01/15/2017 "stopped 1 wk ago"  . Alcohol use Yes     Comment: 01/15/2017 "once q 2-3 months; a beer or glass of wine"     Allergies   Amitriptyline hcl and Ezetimibe   Review of Systems Review of Systems  All  other systems reviewed and are negative.    Physical Exam Updated Vital Signs Temp 98 F (36.7 C) (Oral)   Ht 5' 10.5" (1.791 m)   Wt 134.5 kg (296 lb 8.3 oz)   SpO2 96% Comment:  4 L  BMI 41.94 kg/m   Physical Exam  Constitutional: He is oriented to person, place, and time. He appears well-developed and well-nourished.  Moderately obese, appears uncomfortable, sitting upright in mild respiratory discomfort.  HENT:  Head: Atraumatic.  Eyes: Conjunctivae are normal.  Neck: Neck supple. No JVD present.  Cardiovascular: Normal rate, regular rhythm and intact distal pulses.   Pulmonary/Chest:  Decreased breath sounds without overt wheezes rales or rhonchi  Abdominal: Soft. Bowel sounds are normal. He exhibits no distension. There is no tenderness.  Abdomen is soft and nontender, no abdominal bruits or pulsatile mass  Genitourinary:  Genitourinary Comments: No CVA tenderness  Musculoskeletal: He exhibits tenderness (Mild tenderness to lumbar paraspinal muscle on palpation bilaterally without significant midline spine tenderness.). He exhibits no edema.  Neurological: He is alert and oriented to person, place, and time.  Skin: No rash noted. He is diaphoretic.  Psychiatric: He has a normal mood and affect.  Nursing note and vitals reviewed.    ED Treatments / Results  Labs (all labs ordered are listed, but only abnormal results are displayed) Labs Reviewed  BASIC METABOLIC PANEL - Abnormal; Notable for the following:       Result Value   Chloride 97 (*)    Glucose, Bld 112 (*)    BUN 22 (*)    Creatinine, Ser 1.42 (*)    Calcium 8.6 (*)    GFR calc non Af Amer 50 (*)    GFR calc Af Amer 58 (*)    All other components within normal limits  CBC WITH DIFFERENTIAL/PLATELET - Abnormal; Notable for the following:    WBC 22.7 (*)    Neutro Abs 18.6 (*)    All other components within normal limits  URINALYSIS, ROUTINE W REFLEX MICROSCOPIC - Abnormal; Notable for the following:     APPearance HAZY (*)    Hgb urine dipstick SMALL (*)    Bacteria, UA RARE (*)    Squamous Epithelial / LPF 0-5 (*)    All other components within normal limits  I-STAT ARTERIAL BLOOD GAS, ED - Abnormal; Notable for the following:    pH, Arterial 7.220 (*)    pCO2 arterial 86.9 (*)    Bicarbonate 35.7 (*)    Acid-Base Excess 4.0 (*)    All other components within normal limits  BRAIN NATRIURETIC PEPTIDE  BLOOD GAS, ARTERIAL  I-STAT TROPOININ, ED  I-STAT TROPOININ, ED    EKG  EKG Interpretation  Date/Time:  Wednesday January 21 2017 10:28:22 EDT Ventricular Rate:  71 PR Interval:    QRS Duration: 195 QT Interval:  425 QTC Calculation: 462 R Axis:   75 Text Interpretation:  Atrial fibrillation Nonspecific intraventricular conduction delay No significant change since last tracing Confirmed by Orlie Dakin [phone]) on 01/21/2017 10:51:06 AM       Radiology Dg Chest Portable 1 View  Result Date: 01/21/2017 CLINICAL DATA:  Chest pain, back pain, shortness of breath EXAM: PORTABLE CHEST 1 VIEW COMPARISON:  01/16/2017 FINDINGS: There is mild bilateral interstitial thickening. There is no focal parenchymal opacity. There is no pleural effusion or pneumothorax. There is stable cardiomegaly. The osseous structures are unremarkable. IMPRESSION: Cardiomegaly with mild pulmonary vascular congestion. Electronically Signed   By: [name]   On: 01/21/2017 10:51   Ct Angio Chest/abd/pel For Dissection W And/or W/wo  Result Date: 01/21/2017 CLINICAL DATA:  Chest pain radiating to the back. Short of breath. Symptoms since yesterday. EXAM: CT ANGIOGRAPHY CHEST, ABDOMEN AND PELVIS TECHNIQUE:  Multidetector CT imaging through the chest, abdomen and pelvis was performed using the standard protocol during bolus administration of intravenous contrast. Multiplanar reconstructed images and MIPs were obtained and reviewed to evaluate the vascular anatomy. CONTRAST:  100 cc Isovue 370 COMPARISON:   07/17/2004, CT chest FINDINGS: CTA CHEST FINDINGS Cardiovascular: There is no evidence of aortic dissection, intramural hematoma, or aortic aneurysm. Atherosclerotic calcifications of the aortic arch and great vessels are noted. Great vessels are grossly patent. There is smooth plaque in the proximal left subclavian artery. Vertebral arteries are grossly patent within the confines of the examination. There is no obvious pulmonary thromboembolism. Three vessel coronary artery calcifications. Mediastinum/Nodes: 10 mm short axis diameter subcarinal node on image 75 series 7. There is wall thickening of the lower lobe lobar bronchus bilaterally. There is also wall thickening of lower lobe segmental branches. Small scattered mediastinal nodes. There is wall thickening of the distal esophagus which is moderate. Prominent mediastinal fat. No pericardial effusion. Lungs/Pleura: No pneumothorax. No pleural effusion. Reticulonodular opacities are present in the right upper lobe with a central and peripheral predominant pattern. These this also occurs in the right middle and lower lobes to a lesser degree. The largest nodule is 4 mm in the right upper lobe. See image 17 of series 6. There is a large bleb in the anterior right middle lobe. Musculoskeletal: There are innumerable sclerotic lesions involving bilateral ribs, multiple thoracic vertebral bodies, the sternum, in the manubrium these findings have developed since the prior study. There is no vertebral compression deformity. Review of the MIP images confirms the above findings. CTA ABDOMEN AND PELVIS FINDINGS VASCULAR Aorta: No evidence of dissection. Non aneurysmal and patent. Atherosclerotic calcifications throughout the abdominal aorta are noted. Celiac: Patent. SMA: Patent. Renals: Single renal arteries are patent. IMA: Patent. Inflow: There are atherosclerotic changes in the iliac vasculature. Common iliac arteries are patent bilaterally. There is irregular  calcified and soft plaque in the proximal right external iliac artery which does not appear flow limiting. Left external iliac artery is patent. Mild atherosclerotic calcifications throughout the bilateral internal iliac arteries. Review of the MIP images confirms the above findings. NON-VASCULAR Hepatobiliary: Liver at and gallbladder are within normal limits. Pancreas: Unremarkable Spleen: Unremarkable. Adrenals/Urinary Tract: There are chronic changes of the kidneys. Adrenal glands are within normal limits. There are simple cysts in the right kidneys. Stomach/Bowel: Stomach is within normal limits. Normal appendix. There is bubbly stool within the ascending colon. No obvious mass in the colon. No evidence of small-bowel obstruction. Sigmoid diverticulosis. There is no definitive wall thickening of the sigmoid colon. The colon is decompressed. There is haziness in the fat to the left of the sigmoid colon of unknown significance. There is also stranding anterior to the left iliopsoas muscle. The left iliopsoas muscle is thickened. See image 251. Lymphatic: There is abnormal left para-aortic and left iliac adenopathy. 15 mm left para- aortic node on image 203 of series 7. 15 mm left common iliac node on image 236. Several other smaller nodes are noted. Reproductive: Brachy therapy pellets in the prostate gland. Prostate is normal in size. Other: No free-fluid. Musculoskeletal: Innumerable sclerotic lesions are present in the lumbar spine and pelvis. There is a sclerotic and destructive process in the left iliac bone. This is associated with thickening of the musculature on either side of the left iliac crest. Findings are worrisome for tumor infiltration of the left iliac bone and tumor infiltration of the surrounding musculature. See image 250 of series 7  there is slight enlargement of the bone but this is not dramatic making Paget's disease less likely. There is no vertebral compression deformity. Review of the  MIP images confirms the above findings. IMPRESSION: Thorax: No evidence of thoracic aortic dissection. Abnormal bronchial wall thickening is in the bilateral lower lobe airways. Findings probably represent an inflammatory process. Bronchoscopy may be helpful Abnormal mediastinal adenopathy.  Malignancy is not excluded. Multiple sclerotic lesions in the thoracic spine, ribs, and sternum. Nonspecific wall thickening of the distal esophagus. There are reticulonodular opacities in the right lung likely representing an inflammatory process. The largest nodule is 4 mm. Malignancy is not excluded. No follow-up needed if patient is low-risk (and has no known or suspected primary neoplasm). Non-contrast chest CT can be considered in 12 months if patient is high-risk. This recommendation follows the consensus statement: Guidelines for Management of Incidental Pulmonary Nodules Detected on CT Images: From the Fleischner Society 2017; Radiology 2017; 284:228-243. Abdomen pelvis: No evidence of aortic dissection. Abnormal adenopathy along the aortic chain and left iliac vessels worrisome for metastatic disease. Multiple sclerotic lesions in the lumbar spine and pelvis worrisome for metastatic disease. Carotid and destructive process involving the left iliac bone with extension into the surrounding musculature. This is worrisome for metastatic disease. Given the brachy therapy pellets in the prostate gland, metastatic prostate cancer is of primary concern. Correlation with PSA level is recommended. Electronically Signed   By: Marybelle Killings M.D.   On: 01/21/2017 13:17    Procedures Procedures (including critical care time)  Medications Ordered in ED Medications  morphine 4 MG/ML injection 6 mg (not administered)  morphine 4 MG/ML injection 6 mg (6 mg Intravenous Given 01/21/17 1114)  ondansetron (ZOFRAN) injection 4 mg (4 mg Intravenous Given 01/21/17 1112)  sodium chloride 0.9 % bolus 500 mL (0 mLs Intravenous Stopped  01/21/17 1451)  diphenhydrAMINE (BENADRYL) injection 25 mg (25 mg Intravenous Given 01/21/17 1158)  iopamidol (ISOVUE-370) 76 % injection (100 mLs  Contrast Given 01/21/17 1239)     Initial Impression / Assessment and Plan / ED Course  I have reviewed the triage vital signs and the nursing notes.  Pertinent labs & imaging results that were available during my care of the patient were reviewed by me and considered in my medical decision making (see chart for details).     BP (!) 165/97   Pulse 68   Temp 98.5 F (36.9 C) (Rectal)   Resp 15   Ht 5' 10.5" (1.791 m)   Wt 134.5 kg (296 lb 8.3 oz)   SpO2 93%   BMI 41.94 kg/m    Final Clinical Impressions(s) / ED Diagnoses   Final diagnoses:  COPD exacerbation (HCC)  Hypoxia    New Prescriptions New Prescriptions   No medications on file   10:40 AM Patient here with shortness of breath, and back pain. He appears uncomfortable, mild respiratory discomfort. He does have significant coronary disease. His primary concern is his back pain. Pain is nonradiating. No appreciable abdominal bruit on exam however limited due to large body habitus. He is diaphoretic and given his complaints of chest pain and back pain I plan to obtain chest abdomen and pelvis to rule out aortic dissection. At this time he did not really complain of having true urinary retention. Denies having the urge to urinate, just mentioned he doesn't feels like he wants to urinate. States that he urinates earlier this morning and was normal. I will also plan a bladder scan to check  for postvoid residual. Care discussed with Dr. Winfred Leeds  He was admitted on 07/05-07/07 for evaluation of acute respiratory failure secondary to copd exacerbation and atrial fibrillation.   2:24 PM Patient states he urinate normally and therefore we have low suspicion for acute urinary retention. His urine shows no signs of urinary tract infection. He was diaphoretic, having back pain and did  report having some chest pain earlier today.  Pain has improved with pain medication.  Pt found to be hypoxic, ABG showing pH of 7.22.  Pt placed on biPAP.  Does have elevated WBC of 22.7, normal rectal temp.  CTangio of chest/abd/pelvis were obtain to r/o dissection.  Fortunately no evidence of dissection.  There is abnormal bronchial wall thickening in the bilateral lower lobe airways, which may represent an inflammatory process.  Suggest bronchoscopy for further evaluation.  Pt does have multiple reactive lymph nodes in mediastinum, along aortic chaine and left iliac vessels concerning for metastatic disease.  Multiple sclerotic lesions in the ribs, sternum, thoraic spine, lumbar spine and pelvis also worrisome for metastatic disease.  Does have hx of prostate cancer last treated in 2012.  Wife report there has been a steady increase of his PSA as of late.    3:09 PM Appreciate consultation from [place] Dr. Lake Bells who will see pt in the ER.  If he is doing better, pt may be admit to medicine.    4:31 PM Critical care have evaluated pt and felt he is stable for medicine to admit.   Appreciate consultation from [place] who agrees to see and admit pt for further care.   CRITICAL CARE Performed by: Domenic Moras Total critical care time: 40 minutes Critical care time was exclusive of separately billable procedures and treating other patients. Critical care was necessary to treat or prevent imminent or life-threatening deterioration. Critical care was time spent personally by me on the following activities: development of treatment plan with patient and/or surrogate as well as nursing, discussions with consultants, evaluation of patient's response to treatment, examination of patient, obtaining history from patient or surrogate, ordering and performing treatments and interventions, ordering and review of laboratory studies, ordering and review of radiographic studies, pulse oximetry and  re-evaluation of patient's condition.    Domenic Moras, PA-C 01/21/17 1518    Orlie Dakin, MD 01/21/17 1621    Domenic Moras, PA-C 01/21/17 1634    Orlie Dakin, MD 01/21/17 [phone]

## 2017-01-21 NOTE — Progress Notes (Signed)
Pt transported on Bipap from ED to 3W12.  Pt's vitals remained stable throughout.

## 2017-01-21 NOTE — ED Notes (Signed)
Scanned pt bladder 73ml notified Wendy(RN)

## 2017-01-21 NOTE — Consult Note (Signed)
PULMONARY / CRITICAL CARE MEDICINE   Name: Scott Rollins MRN: [id number] DOB: 10-Oct-1950    ADMISSION DATE:  01/21/2017 CONSULTATION DATE:  7/11  REFERRING MD:  EDP   CHIEF COMPLAINT:  Acute on chronic respiratory failure   HISTORY OF PRESENT ILLNESS:   66yo male with hx severe COPD, CAD, HTN, OSA just d/c 7/5 after admission for AECOPD.  Returns 7/11 with c/o SOB, mild chest pain, difficulty urinating, low back pain.  In ER was found to be hypercarbic with pCO2 87 and started on bipap.  CT chest and abd did r/o acute issues.  PCCM consulted to determine ICU necessity.   PAST MEDICAL HISTORY :  He  has a past medical history of Anginal pain ([place]); Anxiety; Arthritis; CAP (community acquired pneumonia) (11/2016); Colon polyp (2006); COPD (chronic obstructive pulmonary disease) ([place]); Coronary atherosclerosis of unspecified type of vessel, native or graft (2005); Depression; Diverticulosis (2006); ED (erectile dysfunction); Gout; Gouty arthritis; Heart murmur; Hematochezia (09/2012); History of hiatal hernia; Hyperlipidemia; Hypertension; Hypogonadism male; Migraine; Mitral valve prolapse (~ 1981); Myocardial infarction ([place]) (~ 1993- 2005 X 3-4); Neuropathic pain of both feet (2015/2016); Obesities, morbid ([place]); On home oxygen therapy; OSA on CPAP; Osteoarthrosis, unspecified whether generalized or localized, unspecified site; PAF (paroxysmal atrial fibrillation) ([place]) (12/2016); Pre-diabetes (2016/17); Prostate cancer ([place]) (09/2010); Radiation; Seizure disorder ([place]) (08-15-2011); and Status post chemotherapy.  PAST SURGICAL HISTORY: He  has a past surgical history that includes Colonoscopy with propofol (N/A, 04/05/2013); Hot hemostasis (N/A, 04/05/2013); transthoracic echocardiogram (12/2016); Hernia repair; Umbilical hernia repair; Insertion prostate radiation seed (02/24/2011); Cardiac catheterization (23 caths); Coronary angioplasty with stent; and Coronary angioplasty.  Allergies   Allergen Reactions  . Amitriptyline Hcl Nausea Only    REACTION: nausea, elevated bp  . Ezetimibe Other (See Comments)    REACTION: chest pain, fatigue    No current facility-administered medications on file prior to encounter.    Current Outpatient Prescriptions on File Prior to Encounter  Medication Sig  . acetaminophen (TYLENOL) 500 MG tablet Take 500 mg by mouth every 6 (six) hours as needed for mild pain.   Marland Kitchen albuterol (PROAIR HFA) 108 (90 Base) MCG/ACT inhaler Inhale 2 puffs into the lungs every 6 (six) hours as needed for wheezing or shortness of breath.  Marland Kitchen albuterol (PROVENTIL) (2.5 MG/3ML) 0.083% nebulizer solution USE ONE VIAL IN NEBULIZER EVERY 4 HOURS AS NEEDED FOR SHORTNESS OF BREATH OR WHEEZING  . allopurinol (ZYLOPRIM) 300 MG tablet Take 1 tablet (300 mg total) by mouth daily.  Marland Kitchen apixaban (ELIQUIS) 5 MG TABS tablet Take 1 tablet (5 mg total) by mouth 2 (two) times daily.  Marland Kitchen arformoterol (BROVANA) 15 MCG/2ML NEBU Take 2 mLs (15 mcg total) by nebulization 2 (two) times daily.  . bisoprolol-hydrochlorothiazide (ZIAC) 5-6.25 MG tablet Take 1 tablet by mouth every morning.  . budesonide (PULMICORT) 0.25 MG/2ML nebulizer solution Take 2 mLs (0.25 mg total) by nebulization 2 (two) times daily.  . budesonide-formoterol (SYMBICORT) 160-4.5 MCG/ACT inhaler Inhale 2 puffs into the lungs 2 (two) times daily.  . citalopram (CELEXA) 40 MG tablet Take 0.5 tablets (20 mg total) by mouth at bedtime.  . cyclobenzaprine (FLEXERIL) 10 MG tablet Take 1 tablet (10 mg total) by mouth at bedtime.  Marland Kitchen dextromethorphan-guaiFENesin (MUCINEX DM) 30-600 MG 12hr tablet Take 1 tablet by mouth 2 (two) times daily as needed for cough.  . diltiazem (CARDIZEM) 30 MG tablet Take 1 tablet (30 mg total) by mouth every 12 (twelve) hours.  Marland Kitchen doxycycline (VIBRAMYCIN) 50  MG capsule Take 2 capsules (100 mg total) by mouth 2 (two) times daily.  . feeding supplement, ENSURE ENLIVE, (ENSURE ENLIVE) LIQD Take 237 mLs by  mouth 2 (two) times daily between meals.  . furosemide (LASIX) 40 MG tablet Take 2 tablets (80 mg total) by mouth daily as needed. (Patient taking differently: Take 80 mg by mouth daily as needed for fluid. )  . gabapentin (NEURONTIN) 300 MG capsule Take 300 mg by mouth at bedtime.   Marland Kitchen ipratropium-albuterol (DUONEB) 0.5-2.5 (3) MG/3ML SOLN Take 3 mLs by nebulization every 6 (six) hours as needed. (Patient taking differently: Take 3 mLs by nebulization every 6 (six) hours as needed (wheezing). )  . lisinopril (PRINIVIL,ZESTRIL) 20 MG tablet TAKE ONE TABLET BY MOUTH TWICE DAILY (Patient taking differently: TAKE ONE TABLET BY MOUTH DAILY)  . predniSONE (DELTASONE) 10 MG tablet Prednisone 60 mg daily for 3 days followed by  Prednisone 40 mg daily for 3 days followed by  Prednisone 20 mg daily for 3 days followed by Prednisone 10 mg daily for 3 days.  . Probiotic Product (PROBIOTIC PO) Take 1 capsule by mouth daily.  Marland Kitchen zolpidem (AMBIEN) 10 MG tablet Take 1 tablet (10 mg total) by mouth at bedtime as needed for sleep.    FAMILY HISTORY:  His indicated that the status of his mother is unknown. He indicated that the status of his father is unknown.    SOCIAL HISTORY: He  reports that he quit smoking about 3 weeks ago. His smoking use included Cigarettes. He has a 54.00 pack-year smoking history. He has never used smokeless tobacco. He reports that he drinks alcohol. He reports that he uses drugs, including Marijuana.  REVIEW OF SYSTEMS:   C/o increased SOB above baseline, productive cough.  Denies fever, chest pain, hemoptysis, weight loss, purulent sputum.  Also c/o significant back pain.    SUBJECTIVE:    VITAL SIGNS: BP (!) 157/86   Pulse 70   Temp 98.5 F (36.9 C) (Rectal)   Resp 15   Ht 5' 10.5" (1.791 m)   Wt 134.5 kg (296 lb 8.3 oz)   SpO2 93%   BMI 41.94 kg/m   HEMODYNAMICS:    VENTILATOR SETTINGS:    INTAKE / OUTPUT: No intake/output data recorded.  PHYSICAL  EXAMINATION: General:  Pleasant male, NAD on bipap  Neuro:  Slightly drowsy after morphine but easily wakes, appropriate, MAE  HEENT:  Mm dry  Cardiovascular:  s1s2 rrr Lungs:  resps even non labored on bipap, diminished bases, few scattered wheezes  Abdomen:  Protuberant, soft, non tender  Musculoskeletal:  Warm and dry, no edema  LABS:  BMET  Recent Labs Lab 01/16/17 0328 01/17/17 1353 01/21/17 1026  NA 136 137 138  K 4.0 4.3 4.3  CL 96* 96* 97*  CO2 29 33* 29  BUN 31* 24* 22*  CREATININE 1.25* 0.95 1.42*  GLUCOSE 108* 119* 112*    Electrolytes  Recent Labs Lab 01/15/17 1246 01/16/17 0328 01/17/17 1353 01/21/17 1026  CALCIUM  --  8.8* 9.5 8.6*  MG 2.1  --   --   --   PHOS 4.6  --   --   --     CBC  Recent Labs Lab 01/15/17 0630 01/16/17 0328 01/21/17 1026  WBC 13.3* 10.3 22.7*  HGB 15.0 13.7 14.4  HCT 47.7 43.8 46.4  PLT 356 324 323    Coag's  Recent Labs Lab 01/15/17 0630  APTT 45*  INR 1.11  Sepsis Markers  Recent Labs Lab 01/15/17 0926  01/16/17 2147 01/17/17 0238 01/17/17 7106  LATICACIDVEN  --   < > 3.0* 0.8 1.8  PROCALCITON 0.68  --   --   --  0.23  < > = values in this interval not displayed.  ABG  Recent Labs Lab 01/15/17 0959 01/21/17 1421  PHART 7.373 7.220*  PCO2ART 49.0* 86.9*  PO2ART 80.0* 102.0    Liver Enzymes  Recent Labs Lab 01/15/17 0630  AST 20  ALT 19  ALKPHOS 180*  BILITOT 2.7*  ALBUMIN 3.3*    Cardiac Enzymes  Recent Labs Lab 01/15/17 1558 01/15/17 2111 01/16/17 0328  TROPONINI 0.04* 0.03* 0.03*    Glucose No results for input(s): GLUCAP in the last 168 hours.  Imaging Dg Chest Portable 1 View  Result Date: 01/21/2017 CLINICAL DATA:  Chest pain, back pain, shortness of breath EXAM: PORTABLE CHEST 1 VIEW COMPARISON:  01/16/2017 FINDINGS: There is mild bilateral interstitial thickening. There is no focal parenchymal opacity. There is no pleural effusion or pneumothorax. There is  stable cardiomegaly. The osseous structures are unremarkable. IMPRESSION: Cardiomegaly with mild pulmonary vascular congestion. Electronically Signed   By: [name]   On: 01/21/2017 10:51   Ct Angio Chest/abd/pel For Dissection W And/or W/wo  Result Date: 01/21/2017 CLINICAL DATA:  Chest pain radiating to the back. Short of breath. Symptoms since yesterday. EXAM: CT ANGIOGRAPHY CHEST, ABDOMEN AND PELVIS TECHNIQUE: Multidetector CT imaging through the chest, abdomen and pelvis was performed using the standard protocol during bolus administration of intravenous contrast. Multiplanar reconstructed images and MIPs were obtained and reviewed to evaluate the vascular anatomy. CONTRAST:  100 cc Isovue 370 COMPARISON:  07/17/2004, CT chest FINDINGS: CTA CHEST FINDINGS Cardiovascular: There is no evidence of aortic dissection, intramural hematoma, or aortic aneurysm. Atherosclerotic calcifications of the aortic arch and great vessels are noted. Great vessels are grossly patent. There is smooth plaque in the proximal left subclavian artery. Vertebral arteries are grossly patent within the confines of the examination. There is no obvious pulmonary thromboembolism. Three vessel coronary artery calcifications. Mediastinum/Nodes: 10 mm short axis diameter subcarinal node on image 75 series 7. There is wall thickening of the lower lobe lobar bronchus bilaterally. There is also wall thickening of lower lobe segmental branches. Small scattered mediastinal nodes. There is wall thickening of the distal esophagus which is moderate. Prominent mediastinal fat. No pericardial effusion. Lungs/Pleura: No pneumothorax. No pleural effusion. Reticulonodular opacities are present in the right upper lobe with a central and peripheral predominant pattern. These this also occurs in the right middle and lower lobes to a lesser degree. The largest nodule is 4 mm in the right upper lobe. See image 17 of series 6. There is a large bleb in the  anterior right middle lobe. Musculoskeletal: There are innumerable sclerotic lesions involving bilateral ribs, multiple thoracic vertebral bodies, the sternum, in the manubrium these findings have developed since the prior study. There is no vertebral compression deformity. Review of the MIP images confirms the above findings. CTA ABDOMEN AND PELVIS FINDINGS VASCULAR Aorta: No evidence of dissection. Non aneurysmal and patent. Atherosclerotic calcifications throughout the abdominal aorta are noted. Celiac: Patent. SMA: Patent. Renals: Single renal arteries are patent. IMA: Patent. Inflow: There are atherosclerotic changes in the iliac vasculature. Common iliac arteries are patent bilaterally. There is irregular calcified and soft plaque in the proximal right external iliac artery which does not appear flow limiting. Left external iliac artery is patent. Mild atherosclerotic calcifications throughout  the bilateral internal iliac arteries. Review of the MIP images confirms the above findings. NON-VASCULAR Hepatobiliary: Liver at and gallbladder are within normal limits. Pancreas: Unremarkable Spleen: Unremarkable. Adrenals/Urinary Tract: There are chronic changes of the kidneys. Adrenal glands are within normal limits. There are simple cysts in the right kidneys. Stomach/Bowel: Stomach is within normal limits. Normal appendix. There is bubbly stool within the ascending colon. No obvious mass in the colon. No evidence of small-bowel obstruction. Sigmoid diverticulosis. There is no definitive wall thickening of the sigmoid colon. The colon is decompressed. There is haziness in the fat to the left of the sigmoid colon of unknown significance. There is also stranding anterior to the left iliopsoas muscle. The left iliopsoas muscle is thickened. See image 251. Lymphatic: There is abnormal left para-aortic and left iliac adenopathy. 15 mm left para- aortic node on image 203 of series 7. 15 mm left common iliac node on image  236. Several other smaller nodes are noted. Reproductive: Brachy therapy pellets in the prostate gland. Prostate is normal in size. Other: No free-fluid. Musculoskeletal: Innumerable sclerotic lesions are present in the lumbar spine and pelvis. There is a sclerotic and destructive process in the left iliac bone. This is associated with thickening of the musculature on either side of the left iliac crest. Findings are worrisome for tumor infiltration of the left iliac bone and tumor infiltration of the surrounding musculature. See image 250 of series 7 there is slight enlargement of the bone but this is not dramatic making Paget's disease less likely. There is no vertebral compression deformity. Review of the MIP images confirms the above findings. IMPRESSION: Thorax: No evidence of thoracic aortic dissection. Abnormal bronchial wall thickening is in the bilateral lower lobe airways. Findings probably represent an inflammatory process. Bronchoscopy may be helpful Abnormal mediastinal adenopathy.  Malignancy is not excluded. Multiple sclerotic lesions in the thoracic spine, ribs, and sternum. Nonspecific wall thickening of the distal esophagus. There are reticulonodular opacities in the right lung likely representing an inflammatory process. The largest nodule is 4 mm. Malignancy is not excluded. No follow-up needed if patient is low-risk (and has no known or suspected primary neoplasm). Non-contrast chest CT can be considered in 12 months if patient is high-risk. This recommendation follows the consensus statement: Guidelines for Management of Incidental Pulmonary Nodules Detected on CT Images: From the Fleischner Society 2017; Radiology 2017; 284:228-243. Abdomen pelvis: No evidence of aortic dissection. Abnormal adenopathy along the aortic chain and left iliac vessels worrisome for metastatic disease. Multiple sclerotic lesions in the lumbar spine and pelvis worrisome for metastatic disease. Carotid and destructive  process involving the left iliac bone with extension into the surrounding musculature. This is worrisome for metastatic disease. Given the brachy therapy pellets in the prostate gland, metastatic prostate cancer is of primary concern. Correlation with PSA level is recommended. Electronically Signed   By: Marybelle Killings M.D.   On: 01/21/2017 13:17     STUDIES:  CTA chest/abd/pelvis 7/11>>> No evidence of thoracic aortic dissection.  Abnormal bronchial wall thickening is in the bilateral lower lobe airways. Findings probably represent an inflammatory process. Abnormal mediastinal adenopathy.  Malignancy is not excluded. Multiple sclerotic lesions in the thoracic spine, ribs, and sternum. Nonspecific wall thickening of the distal esophagus. There are reticulonodular opacities in the right lung likely representing an inflammatory process. The largest nodule is 4 mm. Malignancy is not excluded.  Abnormal adenopathy along the aortic chain and left iliac vessels worrisome for metastatic disease. Multiple sclerotic lesions  in the lumbar spine and pelvis worrisome for metastatic disease. Carotid and destructive process involving the left iliac bone with extension into the surrounding musculature. This is worrisome for metastatic disease.  CULTURES:   ANTIBIOTICS:   SIGNIFICANT EVENTS:   LINES/TUBES:   DISCUSSION: 66yo male with hx COPD presenting with AECOPD needing bipap  ASSESSMENT / PLAN:  Acute on chronic respiratory failure  AECOPD   PLAN -  Ok for SDU admission per [facility]  Continue bipap as needed  F/u ABG  IV steroids  Duonebs q6h and PRN SABA  Budesonide  F/u CXR  Pulmonary hygiene  levaquin for now given multiple recent admits, leukocytosis but would narrow or d/c quickly    Concern for metastatic prostate cancer - pt and wife seem to already know that this is true and have missed some f/u for prostate ca.  PSA has been steadily climbing according to wife.   Would explain significant back pain.  PLAN -  Needs f/u  Pain management   FAMILY  - Updates:  Pt and wife updated 7/11  PCCM will continue to follow as consult    Nickolas Madrid, NP 01/21/2017  3:41 PM Pager: [phone] or [phone]

## 2017-01-21 NOTE — Telephone Encounter (Signed)
Agree with this triage

## 2017-01-21 NOTE — Patient Outreach (Signed)
[facility]) Care Management  01/21/2017  DILYN SMILES [birth year] [id number]   Subjective: none  Objective: none  Assessment: Referral received per [facility] pharmacist, Denyse Amass. Client recently admitted 7/5-7/7 for respiratory failure due to COPD/atrial fibrillation.   Plan for transition of care call. RNCM noted client to be in emergency room at this time with complaint of shortness of breath and back pain.  Plan: RNCM will continue to follow. Next outreach attempt pending client's disposition.  [name], RN, MSN, [place] Coordinator Cell: [phone]

## 2017-01-21 NOTE — ED Provider Notes (Signed)
Complains of low back pain nonradiating onset this morning. He denies feeling of urinary retention. He admits to mild shortness of breath but feels that it might be secondary to pain. Pain somewhat improved after treatment with morphine. Back pain is not made better or worse by anything, not positional. Patient is ill-appearing alert awake Glasgow coma score 15, lungs clear auscultation heart regular rate and rhythm, abdomen obese, nontender, skin grossly diaphoretic back without point tenderness or flank tenderness   Scott Dakin, MD 01/21/17 1621

## 2017-01-21 NOTE — H&P (Signed)
History and Physical    Scott Rollins IWL:[id number] DOB: July 02, 1951 DOA: 01/21/2017  PCP: Scott Sou, MD Patient coming from: home  Chief Complaint: sob  HISTORY OF PRESENT ILLNESS Scott Rollins 66 yo past medical history that includes chronic respiratory failure related to severe COPD on home oxygen at night and as needed, CAD, hypertension, obstructive sleep apnea, A. fib, prostate cancer presents to the emergency Department chief complaint shortness of breath and mild chest pain. Initial evaluation reveals acute on chronic respiratory failure with hypercarbia and was started on BiPAP.  Information is obtained from the patient and his wife is at the bedside. She reports he was discharged 3 days ago after a 4 day hospitalization for COPD exacerbation. She reports he was discharged with antibiotic and a steroid taper which he just got filled yesterday. She states he was doing well until this morning when he "called me at work and set I can't breathe". She states he has chronic pain and this is why he can't breathe. He denies fever chills lower shin edema. He denies abdominal pain nausea vomiting. He does report his chronic back pain seems to be worse than usual. He denies dysuria hematuria frequency or urgency. He denies diarrhea constipation melena or bright red blood per rectum.  ED Course: In the emergency department he is an acute on chronic respiratory failure with hypercarbia he's placed on BiPAP he is provided with Levaquin and one dose of Solu-Medrol.  Review of Systems: As per HPI otherwise all other systems reviewed and are negative.   Ambulatory Status: Ambulates independently has no recent falls lives at home with his wife  Past Medical History:  Diagnosis Date  . Anginal pain (Scott Rollins)   . Anxiety   . Arthritis    "hips, knees, thoracic back" (01/15/2017)  . CAP (community acquired pneumonia) 11/2016  . Colon polyp 2006   Scott Rollins; Scott Rollins; consider repeat in 5  years  . COPD (chronic obstructive pulmonary disease) (Scott Rollins)    "pearl study" pt thinks he is on Symbicort; pt noncompliant with COPD controller meds on/off due to financial reasons.  . Coronary atherosclerosis of unspecified type of vessel, native or graft 2005   MI, s/p PCI with stent (pt reports 20+ interventions in the past, most recent cat 6/08 showed patent stents).  Normal LV function.  Nuclear stress test NEG 8/09.  . Depression    ?bipolar dx by psychiatrist?  . Diverticulosis 2006  . ED (erectile dysfunction)   . Gout   . Gouty arthritis    Scott Rollins  . Heart murmur   . Hematochezia 09/2012   Hyperplastic polyp, diverticulosis, and internal hemorrhoids found on colonoscopy 10/2012  . History of hiatal hernia   . Hyperlipidemia    Hx of multiple statin intolerance  . Hypertension   . 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 Migraine    "none in the 2000s" (01/15/2017)  . Mitral valve prolapse ~ 1981  . Myocardial infarction Scott Rollins) ~ 1993- 2005 X 3-4   (01/15/2017)  . Neuropathic pain of both feet 2015/2016   Vit B12 and A1c normal 10/2014  . Obesities, morbid (Scott Rollins)   . On home oxygen therapy    "2L prn" (01/15/2017)  . OSA on CPAP    w/oxygen (01/15/2017)  . Osteoarthrosis, unspecified whether generalized or localized, unspecified site    DDD  . PAF (paroxysmal atrial fibrillation) (Scott Rollins) 12/2016   Started in the context of resp illness/lots of bronchodilators.  Started eliquis and cardizem during hospitalization 01/2017 (Dr. Rayann Heman).  . Pre-diabetes 2016/17  . Prostate cancer (Scott Rollins) 09/2010   Localized, high risk disease (Dr. Kimbrough/Grapey):  s/p 5 wks ext bm rad + seed boost, as well as hormone blockade x 1 yr.  Biochemical recurrence 11/2015--urologist did bone scan and spots on T spine and L iliac region came up, pt needs plain films of pelvis/T spine to see if DJD correlate present.  He'll eventually need to resume androgen depriv therapy.  . Radiation     Pelvic--for prostate ca: 25 treatments/01/2011  . Seizure disorder ([place]) 08-15-2011   Deja vu sensations: no w/u.  These stopped when neurontin 300mg  tid was started for a different reason.  . Status post chemotherapy    10/2010 thru 06/2011    Past Surgical History:  Procedure Laterality Date  . CARDIAC CATHETERIZATION  23 caths  . COLONOSCOPY WITH PROPOFOL N/A 04/05/2013   Dr. Fuller Plan.  Polypectomy (hyperplastic--recall 10 yrs).  Moderate diverticulosis, +internal hemorrhoids.  No radiation proctitis.  . CORONARY ANGIOPLASTY    . CORONARY ANGIOPLASTY WITH STENT PLACEMENT     "5-6 stents" (01/15/2017)  . HERNIA REPAIR    . HOT HEMOSTASIS N/A 04/05/2013   Procedure: HOT HEMOSTASIS (ARGON PLASMA COAGULATION/BICAP);  Surgeon: Ladene Artist, MD;  Location: [facility];  Service: Endoscopy;  Laterality: N/A;  . INSERTION PROSTATE RADIATION SEED  02/24/2011  . TRANSTHORACIC ECHOCARDIOGRAM  12/2016   EF 55-60%, moderate LVH, grd I DD, mild LA dilation.  Marland Kitchen UMBILICAL HERNIA REPAIR      Social History   Social History  . Marital status: Married    Spouse name: Scott Rollins  . Number of children: N/A  . Years of education: N/A   Occupational History  . [employer]   Social History Main Topics  . Smoking status: Former Smoker    Packs/day: 1.00    Years: 54.00    Types: Cigarettes    Quit date: 12/31/2016  . Smokeless tobacco: Never Used     Comment: 01/15/2017 "stopped 1 wk ago"  . Alcohol use Yes     Comment: 01/15/2017 "once q 2-3 months; a beer or glass of wine"  . Drug use: Yes    Types: Marijuana     Comment: 01/15/2017 "nothing in the last several weeks"  . Sexual activity: No   Other Topics Concern  . Not on file   Social History Narrative   ** Merged History Encounter **   Lives in [place] but also enjoys spending time at [place] Reactions  . Amitriptyline Hcl Nausea Only    REACTION: nausea, elevated bp  . Ezetimibe Other (See  Comments)    REACTION: chest pain, fatigue    Family History  Problem Relation Age of Onset  . Arthritis Mother   . Heart disease Father   . Bipolar disorder Father     Prior to Admission medications   Medication Sig Start Date End Date Taking? Authorizing Provider  acetaminophen (TYLENOL) 500 MG tablet Take 500 mg by mouth every 6 (six) hours as needed for mild pain.    Yes [provider]  albuterol (PROAIR HFA) 108 (90 Base) MCG/ACT inhaler Inhale 2 puffs into the lungs every 6 (six) hours as needed for wheezing or shortness of breath.   Yes [provider]  albuterol (PROVENTIL) (2.5 MG/3ML) 0.083% nebulizer solution USE ONE VIAL IN NEBULIZER EVERY 4 HOURS AS  NEEDED FOR SHORTNESS OF BREATH OR WHEEZING   Yes [provider]  allopurinol (ZYLOPRIM) 300 MG tablet Take 1 tablet (300 mg total) by mouth daily. 12/17/16  Yes [name], [name], MD  apixaban (ELIQUIS) 5 MG TABS tablet Take 1 tablet (5 mg total) by mouth 2 (two) times daily. 01/17/17  Yes Hosie Poisson, MD  arformoterol (BROVANA) 15 MCG/2ML NEBU Take 2 mLs (15 mcg total) by nebulization 2 (two) times daily. 01/17/17  Yes Hosie Poisson, MD  bisoprolol-hydrochlorothiazide (ZIAC) 5-6.25 MG tablet Take 1 tablet by mouth every morning. 12/17/16  Yes [name], [name], MD  budesonide (PULMICORT) 0.25 MG/2ML nebulizer solution Take 2 mLs (0.25 mg total) by nebulization 2 (two) times daily. 01/17/17  Yes Hosie Poisson, MD  budesonide-formoterol (SYMBICORT) 160-4.5 MCG/ACT inhaler Inhale 2 puffs into the lungs 2 (two) times daily.   Yes [provider]  citalopram (CELEXA) 40 MG tablet Take 0.5 tablets (20 mg total) by mouth at bedtime. 01/17/17  Yes Hosie Poisson, MD  cyclobenzaprine (FLEXERIL) 10 MG tablet Take 1 tablet (10 mg total) by mouth at bedtime. 12/17/16  Yes [name], [name], MD  dextromethorphan-guaiFENesin [facility] DM) 30-600 MG 12hr tablet Take 1 tablet by mouth 2 (two) times daily as needed for cough.    Yes [provider]  diltiazem (CARDIZEM) 30 MG tablet Take 1 tablet (30 mg total) by mouth every 12 (twelve) hours. 01/17/17  Yes Hosie Poisson, MD  doxycycline (VIBRAMYCIN) 50 MG capsule Take 2 capsules (100 mg total) by mouth 2 (two) times daily. 01/17/17 01/22/17 Yes Hosie Poisson, MD  feeding supplement, ENSURE ENLIVE, (ENSURE ENLIVE) LIQD Take 237 mLs by mouth 2 (two) times daily between meals. 01/17/17  Yes Hosie Poisson, MD  furosemide (LASIX) 40 MG tablet Take 2 tablets (80 mg total) by mouth daily as needed. Patient taking differently: Take 80 mg by mouth daily as needed for fluid.  12/10/16  Yes [name], [name], MD  gabapentin (NEURONTIN) 300 MG capsule Take 300 mg by mouth at bedtime.    Yes [provider]  ipratropium-albuterol (DUONEB) 0.5-2.5 (3) MG/3ML SOLN Take 3 mLs by nebulization every 6 (six) hours as needed. Patient taking differently: Take 3 mLs by nebulization every 6 (six) hours as needed (wheezing).  01/17/17  Yes Hosie Poisson, MD  lisinopril (PRINIVIL,ZESTRIL) 20 MG tablet TAKE ONE TABLET BY MOUTH TWICE DAILY Patient taking differently: TAKE ONE TABLET BY MOUTH DAILY 12/31/16  Yes [name], [name], MD  predniSONE (DELTASONE) 10 MG tablet Prednisone 60 mg daily for 3 days followed by  Prednisone 40 mg daily for 3 days followed by  Prednisone 20 mg daily for 3 days followed by Prednisone 10 mg daily for 3 days. 01/17/17  Yes Hosie Poisson, MD  Probiotic Product (PROBIOTIC PO) Take 1 capsule by mouth daily.   Yes [provider]  zolpidem (AMBIEN) 10 MG tablet Take 1 tablet (10 mg total) by mouth at bedtime as needed for sleep. 12/17/16  Yes Scott Sou, MD    Physical Exam: Vitals:   01/21/17 1600 01/21/17 1607 01/21/17 1615 01/21/17 1632  BP: (!) 165/97 (!) 165/97 (!) 159/91 (!) 152/112  Pulse: (!) 59 68 65 64  Resp: 15  15 13   Temp:      TempSrc:      SpO2:  93%  93%  Weight:      Height:         General:  Appears calm and  comfortable, slightly drowsy lying on  his side on BiPAP in no acute distress Eyes:  PERRL, EOMI, normal lids, iris ENT:  grossly normal hearing, lips & tongue, mucous membranes of his mouth are slightly dry pink Neck:  no LAD, masses or thyromegaly Cardiovascular: Irregularly irregular, no m/r/g. No LE edema.  Respiratory:  Mild increased work of breathing with conversation. Breath sounds are quite distant I hear no wheezes no crackles Abdomen:  soft, ntnd, quite obese positive bowel sounds throughout no guarding or rebounding Skin:  no rash or induration seen on limited exam Musculoskeletal:  grossly normal tone BUE/BLE, good ROM, no bony abnormality Psychiatric:  grossly normal mood and affect, speech fluent and appropriate, AOx3 Neurologic:  CN 2-12 grossly intact, moves all extremities in coordinated fashion, sensation intact  Labs on Admission: I have personally reviewed following labs and imaging studies  CBC:  Recent Labs Lab 01/15/17 0630 01/16/17 0328 01/21/17 1026  WBC 13.3* 10.3 22.7*  NEUTROABS 10.5*  --  18.6*  HGB 15.0 13.7 14.4  HCT 47.7 43.8 46.4  MCV 89.3 89.9 90.3  PLT 356 324 902   Basic Metabolic Panel:  Recent Labs Lab 01/15/17 0630 01/15/17 1246 01/16/17 0328 01/17/17 1353 01/21/17 1026  NA 137  --  136 137 138  K 4.2  --  4.0 4.3 4.3  CL 95*  --  96* 96* 97*  CO2 25  --  29 33* 29  GLUCOSE 99  --  108* 119* 112*  BUN 23*  --  31* 24* 22*  CREATININE 1.17  --  1.25* 0.95 1.42*  CALCIUM 9.1  --  8.8* 9.5 8.6*  MG  --  2.1  --   --   --   PHOS  --  4.6  --   --   --    GFR: Estimated Creatinine Clearance: 70.2 mL/min (A) (by C-G formula based on SCr of 1.42 mg/dL (H)). Liver Function Tests:  Recent Labs Lab 01/15/17 0630  AST 20  ALT 19  ALKPHOS 180*  BILITOT 2.7*  PROT 7.4  ALBUMIN 3.3*   No results for input(s): LIPASE, AMYLASE in the last 168 hours. No results for input(s): AMMONIA in the last 168 hours. Coagulation  Profile:  Recent Labs Lab 01/15/17 0630  INR 1.11   Cardiac Enzymes:  Recent Labs Lab 01/15/17 1558 01/15/17 2111 01/16/17 0328  TROPONINI 0.04* 0.03* 0.03*   BNP (last 3 results) No results for input(s): PROBNP in the last 8760 hours. HbA1C: No results for input(s): HGBA1C in the last 72 hours. CBG: No results for input(s): GLUCAP in the last 168 hours. Lipid Profile: No results for input(s): CHOL, HDL, LDLCALC, TRIG, CHOLHDL, LDLDIRECT in the last 72 hours. Thyroid Function Tests: No results for input(s): TSH, T4TOTAL, FREET4, T3FREE, THYROIDAB in the last 72 hours. Anemia Panel: No results for input(s): VITAMINB12, FOLATE, FERRITIN, TIBC, IRON, RETICCTPCT in the last 72 hours. Urine analysis:    Component Value Date/Time   COLORURINE YELLOW 01/21/2017 1401   APPEARANCEUR HAZY (A) 01/21/2017 1401   LABSPEC 1.028 01/21/2017 1401   PHURINE 5.0 01/21/2017 1401   GLUCOSEU NEGATIVE 01/21/2017 1401   HGBUR SMALL (A) 01/21/2017 1401   HGBUR negative 08/21/2010 0831   BILIRUBINUR NEGATIVE 01/21/2017 1401   KETONESUR NEGATIVE 01/21/2017 1401   PROTEINUR NEGATIVE 01/21/2017 1401   UROBILINOGEN 0.2 08/21/2010 0831   NITRITE NEGATIVE 01/21/2017 1401   LEUKOCYTESUR NEGATIVE 01/21/2017 1401    Creatinine Clearance: Estimated Creatinine Clearance: 70.2 mL/min (A) (by C-G formula based on  SCr of 1.42 mg/dL (H)).  Sepsis Labs: @LABRCNTIP (procalcitonin:4,lacticidven:4) ) Recent Results (from the past 240 hour(s))  Culture, blood (Routine X 2) w Reflex to ID Panel     Status: None   Collection Time: 01/15/17  9:30 AM  Result Value Ref Range Status   Specimen Description BLOOD RIGHT FOREARM  Final   Special Requests   Final    BOTTLES DRAWN AEROBIC AND ANAEROBIC Blood Culture adequate volume   Culture NO GROWTH 5 DAYS  Final   Report Status 01/20/2017 FINAL  Final  Culture, blood (Routine X 2) w Reflex to ID Panel     Status: None   Collection Time: 01/15/17  9:40 AM   Result Value Ref Range Status   Specimen Description BLOOD RIGHT HAND  Final   Special Requests   Final    BOTTLES DRAWN AEROBIC AND ANAEROBIC Blood Culture adequate volume   Culture NO GROWTH 5 DAYS  Final   Report Status 01/20/2017 FINAL  Final  Culture, Urine     Status: Abnormal   Collection Time: 01/15/17 10:51 AM  Result Value Ref Range Status   Specimen Description URINE, RANDOM  Final   Special Requests NONE  Final   Culture (A)  Final    40,000 COLONIES/mL DIPHTHEROIDS(CORYNEBACTERIUM SPECIES) Standardized susceptibility testing for this organism is not available.    Report Status 01/16/2017 FINAL  Final  Culture, expectorated sputum-assessment     Status: None   Collection Time: 01/15/17  1:31 PM  Result Value Ref Range Status   Specimen Description EXPECTORATED SPUTUM  Final   Special Requests NONE  Final   Sputum evaluation THIS SPECIMEN IS ACCEPTABLE FOR SPUTUM CULTURE  Final   Report Status 01/15/2017 FINAL  Final  Culture, respiratory (NON-Expectorated)     Status: None   Collection Time: 01/15/17  1:31 PM  Result Value Ref Range Status   Specimen Description EXPECTORATED SPUTUM  Final   Special Requests NONE Reflexed from [id number]  Final   Gram Stain   Final    MODERATE WBC PRESENT, PREDOMINANTLY PMN RARE GRAM NEGATIVE RODS    Culture   Final    FEW HAEMOPHILUS INFLUENZAE BETA LACTAMASE POSITIVE    Report Status 01/17/2017 FINAL  Final  Respiratory Panel by PCR     Status: None   Collection Time: 01/15/17  4:38 PM  Result Value Ref Range Status   Adenovirus NOT DETECTED NOT DETECTED Final   Coronavirus 229E NOT DETECTED NOT DETECTED Final   Coronavirus HKU1 NOT DETECTED NOT DETECTED Final   Coronavirus NL63 NOT DETECTED NOT DETECTED Final   Coronavirus OC43 NOT DETECTED NOT DETECTED Final   Metapneumovirus NOT DETECTED NOT DETECTED Final   Rhinovirus / Enterovirus NOT DETECTED NOT DETECTED Final   Influenza A NOT DETECTED NOT DETECTED Final   Influenza  B NOT DETECTED NOT DETECTED Final   Parainfluenza Virus 1 NOT DETECTED NOT DETECTED Final   Parainfluenza Virus 2 NOT DETECTED NOT DETECTED Final   Parainfluenza Virus 3 NOT DETECTED NOT DETECTED Final   Parainfluenza Virus 4 NOT DETECTED NOT DETECTED Final   Respiratory Syncytial Virus NOT DETECTED NOT DETECTED Final   Bordetella pertussis NOT DETECTED NOT DETECTED Final   Chlamydophila pneumoniae NOT DETECTED NOT DETECTED Final   Mycoplasma pneumoniae NOT DETECTED NOT DETECTED Final  MRSA PCR Screening     Status: None   Collection Time: 01/15/17  4:38 PM  Result Value Ref Range Status   MRSA by PCR NEGATIVE NEGATIVE Final  Comment:        The GeneXpert MRSA Assay (FDA approved for NASAL specimens only), is one component of a comprehensive MRSA colonization surveillance program. It is not intended to diagnose MRSA infection nor to guide or monitor treatment for MRSA infections.      Radiological Exams on Admission: Dg Chest Portable 1 View  Result Date: 01/21/2017 CLINICAL DATA:  Chest pain, back pain, shortness of breath EXAM: PORTABLE CHEST 1 VIEW COMPARISON:  01/16/2017 FINDINGS: There is mild bilateral interstitial thickening. There is no focal parenchymal opacity. There is no pleural effusion or pneumothorax. There is stable cardiomegaly. The osseous structures are unremarkable. IMPRESSION: Cardiomegaly with mild pulmonary vascular congestion. Electronically Signed   By: [name]   On: 01/21/2017 10:51   Ct Angio Chest/abd/pel For Dissection W And/or W/wo  Result Date: 01/21/2017 CLINICAL DATA:  Chest pain radiating to the back. Short of breath. Symptoms since yesterday. EXAM: CT ANGIOGRAPHY CHEST, ABDOMEN AND PELVIS TECHNIQUE: Multidetector CT imaging through the chest, abdomen and pelvis was performed using the standard protocol during bolus administration of intravenous contrast. Multiplanar reconstructed images and MIPs were obtained and reviewed to evaluate the  vascular anatomy. CONTRAST:  100 cc Isovue 370 COMPARISON:  07/17/2004, CT chest FINDINGS: CTA CHEST FINDINGS Cardiovascular: There is no evidence of aortic dissection, intramural hematoma, or aortic aneurysm. Atherosclerotic calcifications of the aortic arch and great vessels are noted. Great vessels are grossly patent. There is smooth plaque in the proximal left subclavian artery. Vertebral arteries are grossly patent within the confines of the examination. There is no obvious pulmonary thromboembolism. Three vessel coronary artery calcifications. Mediastinum/Nodes: 10 mm short axis diameter subcarinal node on image 75 series 7. There is wall thickening of the lower lobe lobar bronchus bilaterally. There is also wall thickening of lower lobe segmental branches. Small scattered mediastinal nodes. There is wall thickening of the distal esophagus which is moderate. Prominent mediastinal fat. No pericardial effusion. Lungs/Pleura: No pneumothorax. No pleural effusion. Reticulonodular opacities are present in the right upper lobe with a central and peripheral predominant pattern. These this also occurs in the right middle and lower lobes to a lesser degree. The largest nodule is 4 mm in the right upper lobe. See image 17 of series 6. There is a large bleb in the anterior right middle lobe. Musculoskeletal: There are innumerable sclerotic lesions involving bilateral ribs, multiple thoracic vertebral bodies, the sternum, in the manubrium these findings have developed since the prior study. There is no vertebral compression deformity. Review of the MIP images confirms the above findings. CTA ABDOMEN AND PELVIS FINDINGS VASCULAR Aorta: No evidence of dissection. Non aneurysmal and patent. Atherosclerotic calcifications throughout the abdominal aorta are noted. Celiac: Patent. SMA: Patent. Renals: Single renal arteries are patent. IMA: Patent. Inflow: There are atherosclerotic changes in the iliac vasculature. Common iliac  arteries are patent bilaterally. There is irregular calcified and soft plaque in the proximal right external iliac artery which does not appear flow limiting. Left external iliac artery is patent. Mild atherosclerotic calcifications throughout the bilateral internal iliac arteries. Review of the MIP images confirms the above findings. NON-VASCULAR Hepatobiliary: Liver at and gallbladder are within normal limits. Pancreas: Unremarkable Spleen: Unremarkable. Adrenals/Urinary Tract: There are chronic changes of the kidneys. Adrenal glands are within normal limits. There are simple cysts in the right kidneys. Stomach/Bowel: Stomach is within normal limits. Normal appendix. There is bubbly stool within the ascending colon. No obvious mass in the colon. No evidence of small-bowel obstruction. Sigmoid  diverticulosis. There is no definitive wall thickening of the sigmoid colon. The colon is decompressed. There is haziness in the fat to the left of the sigmoid colon of unknown significance. There is also stranding anterior to the left iliopsoas muscle. The left iliopsoas muscle is thickened. See image 251. Lymphatic: There is abnormal left para-aortic and left iliac adenopathy. 15 mm left para- aortic node on image 203 of series 7. 15 mm left common iliac node on image 236. Several other smaller nodes are noted. Reproductive: Brachy therapy pellets in the prostate gland. Prostate is normal in size. Other: No free-fluid. Musculoskeletal: Innumerable sclerotic lesions are present in the lumbar spine and pelvis. There is a sclerotic and destructive process in the left iliac bone. This is associated with thickening of the musculature on either side of the left iliac crest. Findings are worrisome for tumor infiltration of the left iliac bone and tumor infiltration of the surrounding musculature. See image 250 of series 7 there is slight enlargement of the bone but this is not dramatic making Paget's disease less likely. There is  no vertebral compression deformity. Review of the MIP images confirms the above findings. IMPRESSION: Thorax: No evidence of thoracic aortic dissection. Abnormal bronchial wall thickening is in the bilateral lower lobe airways. Findings probably represent an inflammatory process. Bronchoscopy may be helpful Abnormal mediastinal adenopathy.  Malignancy is not excluded. Multiple sclerotic lesions in the thoracic spine, ribs, and sternum. Nonspecific wall thickening of the distal esophagus. There are reticulonodular opacities in the right lung likely representing an inflammatory process. The largest nodule is 4 mm. Malignancy is not excluded. No follow-up needed if patient is low-risk (and has no known or suspected primary neoplasm). Non-contrast chest CT can be considered in 12 months if patient is high-risk. This recommendation follows the consensus statement: Guidelines for Management of Incidental Pulmonary Nodules Detected on CT Images: From the Fleischner Society 2017; Radiology 2017; 284:228-243. Abdomen pelvis: No evidence of aortic dissection. Abnormal adenopathy along the aortic chain and left iliac vessels worrisome for metastatic disease. Multiple sclerotic lesions in the lumbar spine and pelvis worrisome for metastatic disease. Carotid and destructive process involving the left iliac bone with extension into the surrounding musculature. This is worrisome for metastatic disease. Given the brachy therapy pellets in the prostate gland, metastatic prostate cancer is of primary concern. Correlation with PSA level is recommended. Electronically Signed   By: Marybelle Killings M.D.   On: 01/21/2017 13:17    EKG: Independently reviewed. Atrial fibrillation Nonspecific intraventricular conduction delay  Assessment/Plan Principal Problem:   Acute on chronic respiratory failure (HCC) Active Problems:   OBESITY   Essential hypertension   Coronary artery disease due to lipid rich plaque   Prostate cancer (HCC)    Chronic gout   Chronic pain syndrome   COPD (chronic obstructive pulmonary disease) (HCC)   COPD with acute exacerbation (HCC)   OSA (obstructive sleep apnea)   Atrial fibrillation ([place])   Acute kidney injury (HCC)   Abnormal CT of the chest   1. Acute on chronic respiratory failure related to COPD exacerbation. Recently hospitalized for same and did not fill his prednisone taper prescription. ABG with pH of 7.22, PCO2 86.9, PCO2 102, total CO2 38, bicarbonate 35.7. Chest x-ray cardiomegaly with mild pulmonary vascular congestion. CT of the chest no evidence of thoracic aortic dissection. Abnormal bronchial wall thickening is in the bilateral lower lobe airways. Findings probably represent an inflammatory process. Abnormal mediastinal adenopathy.  Malignancy is not excluded. Wife  states patient has a pulmonologist initial appointment scheduled for next week. -Admit to step down -Continue BiPAP -IV Solu-Medrol -Scheduled nebulizers -Levaquin -Wean BiPAP when appropriate -appreciate PCCM input -f/u ABG -repeat chest xray in am  #2. COPD exacerbation. Patient is on home oxygen at night. Recently discharged after a 3 day stay for same. Chest x-ray as noted above. ABG as noted above. -Steroids. -Scheduled meds. -Pulmicort. -Wean BiPAP as able -Has an appointment with pulmonology next week.  #3. A. fib. Rate controlled. Home medications include Eliquis chadvasc score 4. Evaluated by cardiology last hospitalization who opined patient poor candidate for anticoagulation for noncompliance and AAT. EKG as noted above. Home meds include diltiazem -Continue home meds -monitor  4. Acute kidney injury. Creatinine 1.4 on admission. -Gentle IV fluids -Hold nephrotoxins -Monitor urine output -Recheck in the morning  #5. Abnormal CT of the chest. sclerotic lesions noted on bilateral ribs lumbar spine and pelvis. There is a sclerotic and destructive process in the left iliac bone worisome for  tumor infiltration left iliac bone. She with a history of prostate cancer. Wife states that patient's PSA has been trending up. This is likely where his pain is coming from -Pain management -PSA -OP follow up  #6. Hypertension. Poor control in the emergency department. Home medications include hydrochlorothiazide,bisoprolol, diltiazem, lisinopril, lasix -Hold lisinipril -continue other home meds     DVT prophylaxis: scd  Code Status: full  Family Communication: wife at bedside  Disposition Plan: home  Consults called: PCCM  Admission status: inpatient    Radene Gunning MD [facility]  If 7PM-7AM, please contact night-coverage [email or web address] Password [facility]  01/21/2017, 4:44 PM

## 2017-01-21 NOTE — ED Triage Notes (Signed)
Pt in from home c/o mid CP & SOB with inability to urinate since last night, pt takes Lasix, pt uses O2 Lynndyl PRN at home, pt on 4L [place] upon arrival to ED, pt rcvd 324 mg ASA pta & x 3sL nitro that took pain from 7/10 to 5/10, pt diaphoretic upon arrival, speaks in full sentences, A&O x4

## 2017-01-22 ENCOUNTER — Inpatient Hospital Stay (HOSPITAL_COMMUNITY): Payer: Medicare Other

## 2017-01-22 ENCOUNTER — Encounter (HOSPITAL_COMMUNITY): Payer: Self-pay | Admitting: General Practice

## 2017-01-22 DIAGNOSIS — G4733 Obstructive sleep apnea (adult) (pediatric): Secondary | ICD-10-CM

## 2017-01-22 DIAGNOSIS — J441 Chronic obstructive pulmonary disease with (acute) exacerbation: Secondary | ICD-10-CM

## 2017-01-22 DIAGNOSIS — C61 Malignant neoplasm of prostate: Secondary | ICD-10-CM

## 2017-01-22 DIAGNOSIS — I10 Essential (primary) hypertension: Secondary | ICD-10-CM

## 2017-01-22 DIAGNOSIS — C7951 Secondary malignant neoplasm of bone: Secondary | ICD-10-CM

## 2017-01-22 DIAGNOSIS — G894 Chronic pain syndrome: Secondary | ICD-10-CM

## 2017-01-22 LAB — BLOOD GAS, ARTERIAL
Acid-Base Excess: 5.4 mmol/L — ABNORMAL HIGH (ref 0.0–2.0)
Bicarbonate: 31.3 mmol/L — ABNORMAL HIGH (ref 20.0–28.0)
Delivery systems: POSITIVE
Drawn by: 270271
Expiratory PAP: 6
FIO2: 30
Inspiratory PAP: 15
O2 Saturation: 94 %
Patient temperature: 98.6
pCO2 arterial: 64.1 mmHg — ABNORMAL HIGH (ref 32.0–48.0)
pH, Arterial: 7.309 — ABNORMAL LOW (ref 7.350–7.450)
pO2, Arterial: 77 mmHg — ABNORMAL LOW (ref 83.0–108.0)

## 2017-01-22 LAB — GLUCOSE, CAPILLARY
Glucose-Capillary: 137 mg/dL — ABNORMAL HIGH (ref 65–99)
Glucose-Capillary: 119 mg/dL — ABNORMAL HIGH (ref 65–99)

## 2017-01-22 LAB — BASIC METABOLIC PANEL
ANION GAP: 12 (ref 5–15)
ANION GAP: 13 (ref 5–15)
BUN: 28 mg/dL — ABNORMAL HIGH (ref 6–20)
BUN: 34 mg/dL — ABNORMAL HIGH (ref 6–20)
CALCIUM: 8.3 mg/dL — AB (ref 8.9–10.3)
CHLORIDE: 99 mmol/L — AB (ref 101–111)
CHLORIDE: 95 mmol/L — AB (ref 101–111)
CO2: 28 mmol/L (ref 22–32)
CO2: 28 mmol/L (ref 22–32)
CREATININE: 1.42 mg/dL — AB (ref 0.61–1.24)
Calcium: 8.6 mg/dL — ABNORMAL LOW (ref 8.9–10.3)
Creatinine, Ser: 1.4 mg/dL — ABNORMAL HIGH (ref 0.61–1.24)
GFR calc non Af Amer: 50 mL/min — ABNORMAL LOW (ref >60)
GFR calc non Af Amer: 50 mL/min — ABNORMAL LOW (ref >60)
GFR, EST AFRICAN AMERICAN: 58 mL/min — AB (ref >60)
GFR, EST AFRICAN AMERICAN: 59 mL/min — AB (ref >60)
GLUCOSE: 308 mg/dL — AB (ref 65–99)
Glucose, Bld: 108 mg/dL — ABNORMAL HIGH (ref 65–99)
POTASSIUM: 4.5 mmol/L (ref 3.5–5.1)
Potassium: 5.2 mmol/L — ABNORMAL HIGH (ref 3.5–5.1)
SODIUM: 139 mmol/L (ref 135–145)
Sodium: 136 mmol/L (ref 135–145)

## 2017-01-22 LAB — CBC
HEMATOCRIT: 45.3 % (ref 39.0–52.0)
Hemoglobin: 13.8 g/dL (ref 13.0–17.0)
MCH: 27.7 pg (ref 26.0–34.0)
MCHC: 30.5 g/dL (ref 30.0–36.0)
MCV: 90.8 fL (ref 78.0–100.0)
Platelets: 263 10*3/uL (ref 150–400)
RBC: 4.99 MIL/uL (ref 4.22–5.81)
RDW: 14.8 % (ref 11.5–15.5)
WBC: 15.9 10*3/uL — ABNORMAL HIGH (ref 4.0–10.5)

## 2017-01-22 MED ORDER — ALBUTEROL SULFATE (2.5 MG/3ML) 0.083% IN NEBU
2.5000 mg | INHALATION_SOLUTION | Freq: Three times a day (TID) | RESPIRATORY_TRACT | Status: DC
  Administered 2017-01-23 – 2017-01-24 (×6): 2.5 mg via RESPIRATORY_TRACT
  Filled 2017-01-22 (×7): qty 3

## 2017-01-22 MED ORDER — INSULIN ASPART 100 UNIT/ML ~~LOC~~ SOLN
0.0000 [IU] | Freq: Three times a day (TID) | SUBCUTANEOUS | Status: DC
  Administered 2017-01-22: 1 [IU] via SUBCUTANEOUS
  Administered 2017-01-23: 2 [IU] via SUBCUTANEOUS
  Administered 2017-01-23 – 2017-01-24 (×2): 3 [IU] via SUBCUTANEOUS
  Administered 2017-01-24: 2 [IU] via SUBCUTANEOUS

## 2017-01-22 MED ORDER — ALBUTEROL SULFATE (2.5 MG/3ML) 0.083% IN NEBU: 2.5000 mg | INHALATION_SOLUTION | RESPIRATORY_TRACT | Status: DC | PRN

## 2017-01-22 MED ORDER — UMECLIDINIUM-VILANTEROL 62.5-25 MCG/INH IN AEPB: 1.0000 | INHALATION_SPRAY | Freq: Every day | RESPIRATORY_TRACT | Status: DC

## 2017-01-22 MED ORDER — BICALUTAMIDE 50 MG PO TABS
50.0000 mg | ORAL_TABLET | Freq: Every day | ORAL | Status: DC
  Administered 2017-01-23 – 2017-01-25 (×3): 50 mg via ORAL
  Filled 2017-01-22 (×3): qty 1

## 2017-01-22 MED ORDER — METHYLPREDNISOLONE SODIUM SUCC 125 MG IJ SOLR
60.0000 mg | Freq: Every day | INTRAMUSCULAR | Status: DC
  Administered 2017-01-23 – 2017-01-24 (×2): 60 mg via INTRAVENOUS
  Filled 2017-01-22 (×3): qty 2

## 2017-01-22 MED ORDER — UMECLIDINIUM-VILANTEROL 62.5-25 MCG/INH IN AEPB
1.0000 | INHALATION_SPRAY | Freq: Every day | RESPIRATORY_TRACT | Status: DC
  Administered 2017-01-23 – 2017-01-25 (×3): 1 via RESPIRATORY_TRACT
  Filled 2017-01-22: qty 14

## 2017-01-22 MED ORDER — ALBUTEROL SULFATE (2.5 MG/3ML) 0.083% IN NEBU
2.5000 mg | INHALATION_SOLUTION | Freq: Four times a day (QID) | RESPIRATORY_TRACT | Status: DC
  Administered 2017-01-22: 2.5 mg via RESPIRATORY_TRACT
  Filled 2017-01-22: qty 3

## 2017-01-22 MED ORDER — IPRATROPIUM-ALBUTEROL 0.5-2.5 (3) MG/3ML IN SOLN: 3.0000 mL | RESPIRATORY_TRACT | Status: DC

## 2017-01-22 MED ORDER — LEUPROLIDE ACETATE 7.5 MG IM KIT
7.5000 mg | PACK | Freq: Once | INTRAMUSCULAR | Status: DC
  Filled 2017-01-22: qty 7.5

## 2017-01-22 MED ORDER — INSULIN ASPART 100 UNIT/ML ~~LOC~~ SOLN: 0.0000 [IU] | Freq: Every day | SUBCUTANEOUS | Status: DC

## 2017-01-22 NOTE — Progress Notes (Signed)
PROGRESS NOTE    Scott Rollins  BZJ:[id number] DOB: 04-06-1951 DOA: 01/21/2017 PCP: [name], MD   Outpatient Specialists:     Brief Narrative:  66yo male with hx severe COPD, CAD, HTN, OSA just d/c 7/5 after admission for AECOPD.  Returns 7/11 with c/o SOB, mild chest pain, difficulty urinating, low back pain.  In ER was found to be hypercarbic with pCO2 87 and started on bipap.  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  Oncology consulted to start plan.  Patient high risk to bounce back.     Assessment & Plan:   Principal Problem:   Acute on chronic respiratory failure (HCC) Active Problems:   OBESITY   Essential hypertension   Coronary artery disease due to lipid rich plaque   Prostate cancer (HCC)   Chronic gout   Chronic pain syndrome   COPD (chronic obstructive pulmonary disease) (HCC)   COPD with acute exacerbation (HCC)   OSA (obstructive sleep apnea)   Atrial fibrillation (HCC)   Acute kidney injury (HCC)   Abnormal CT of the chest   .Acute on chronic respiratory failure related to CO2 retention perhaps due to pain medications -seen by Dr. [name]: Acute hypercarbic respiratory failure-doubt that this was a true COPD exacerbation since he did not have any bronchospasm, acute respiratory acidosis was more likely related to narcotics that he took for back pain. Can continue Symbicort on discharge, he will need outpatient pulmonary follow-up and LAMA can be added on that visit based on lung function   A. fib. Rate controlled. Home medications include Eliquis chadvasc score 4. Evaluated by cardiology last hospitalization who opined patient poor candidate for anticoagulation for noncompliance and AAT. EKG as noted above. Home meds include diltiazem -Continue home meds -monitor  Acute kidney injury. -monitor with daily labs -hold lisinopril Hold PRN lasix  Abnormal CT of the chest. sclerotic lesions noted on bilateral ribs lumbar spine and pelvis. There is a  sclerotic and destructive process in the left iliac bone worisome for tumor infiltration left iliac bone.  H/o prostate cancer- PSA 24 -spoke with oncology and will get bone scan and [name] will see in hospital (concern are pain meds interfering with breathing and causing CO2 retention)  Hypertension.  on hydrochlorothiazide,bisoprolol, diltiazem, lisinopril, lasix -Hold lisinipril -continue other home meds  Hyperglycemia (h/o pre-diabetes) -SSI  Leukocytosis -suspect related to steroids  DVT prophylaxis:  SCD's  Code Status: Full Code   Family Communication: At bedside  Disposition Plan:     Consultants:   pulm  oncology  Procedures:        Subjective: Getting breathing treatment, in good spirits, cracking jokes  Objective: Vitals:   01/22/17 0757 01/22/17 0904 01/22/17 1129 01/22/17 1330  BP:  (!) 166/89  (!) 150/80  Pulse:    69  Resp:    15  Temp:    98.2 F (36.8 C)  TempSrc:    Oral  SpO2: 94%  95% 97%  Weight:      Height:        Intake/Output Summary (Last 24 hours) at 01/22/17 1405 Last data filed at 01/22/17 1200  Gross per 24 hour  Intake              423 ml  Output             1075 ml  Net             -652 ml   Filed Weights   01/21/17  1019 01/21/17 1838 01/22/17 0500  Weight: 134.5 kg (296 lb 8.3 oz) 124.5 kg (274 lb 6.4 oz) 122.1 kg (269 lb 1.6 oz)    Examination:  General exam: Appears calm and comfortable, in chair, NAD Respiratory system: diminished due to size but no wheeze Cardiovascular system: S1 & S2 heard, RRR. No JVD, murmurs, rubs, gallops or clicks. No pedal edema. Gastrointestinal system: Abdomen is nondistended, soft and nontender. No organomegaly or masses felt. Normal bowel sounds heard. Central nervous system: Alert and oriented. No focal neurological deficits. Extremities: Symmetric 5 x 5 power. Skin: No rashes, lesions or ulcers Psychiatry: Judgement and insight appear normal. Mood & affect  appropriate.     Data Reviewed: I have personally reviewed following labs and imaging studies  CBC:  Recent Labs Lab 01/16/17 0328 01/21/17 1026 01/22/17 0234  WBC 10.3 22.7* 15.9*  NEUTROABS  --  18.6*  --   HGB 13.7 14.4 13.8  HCT 43.8 46.4 45.3  MCV 89.9 90.3 90.8  PLT 324 323 465   Basic Metabolic Panel:  Recent Labs Lab 01/16/17 0328 01/17/17 1353 01/21/17 1026 01/22/17 0234 01/22/17 1154  NA 136 137 138 139 136  K 4.0 4.3 4.3 5.2* 4.5  CL 96* 96* 97* 99* 95*  CO2 29 33* 29 28 28   GLUCOSE 108* 119* 112* 108* 308*  BUN 31* 24* 22* 28* 34*  CREATININE 1.25* 0.95 1.42* 1.42* 1.40*  CALCIUM 8.8* 9.5 8.6* 8.3* 8.6*   GFR: Estimated Creatinine Clearance: 67.6 mL/min (A) (by C-G formula based on SCr of 1.4 mg/dL (H)). Liver Function Tests: No results for input(s): AST, ALT, ALKPHOS, BILITOT, PROT, ALBUMIN in the last 168 hours. No results for input(s): LIPASE, AMYLASE in the last 168 hours. No results for input(s): AMMONIA in the last 168 hours. Coagulation Profile: No results for input(s): INR, PROTIME in the last 168 hours. Cardiac Enzymes:  Recent Labs Lab 01/15/17 1558 01/15/17 2111 01/16/17 0328  TROPONINI 0.04* 0.03* 0.03*   BNP (last 3 results) No results for input(s): PROBNP in the last 8760 hours. HbA1C: No results for input(s): HGBA1C in the last 72 hours. CBG: No results for input(s): GLUCAP in the last 168 hours. Lipid Profile: No results for input(s): CHOL, HDL, LDLCALC, TRIG, CHOLHDL, LDLDIRECT in the last 72 hours. Thyroid Function Tests: No results for input(s): TSH, T4TOTAL, FREET4, T3FREE, THYROIDAB in the last 72 hours. Anemia Panel: No results for input(s): VITAMINB12, FOLATE, FERRITIN, TIBC, IRON, RETICCTPCT in the last 72 hours. Urine analysis:    Component Value Date/Time   COLORURINE YELLOW 01/21/2017 1401   APPEARANCEUR HAZY (A) 01/21/2017 1401   LABSPEC 1.028 01/21/2017 1401   PHURINE 5.0 01/21/2017 1401   GLUCOSEU  NEGATIVE 01/21/2017 1401   HGBUR SMALL (A) 01/21/2017 1401   HGBUR negative 08/21/2010 0831   BILIRUBINUR NEGATIVE 01/21/2017 1401   KETONESUR NEGATIVE 01/21/2017 1401   PROTEINUR NEGATIVE 01/21/2017 1401   UROBILINOGEN 0.2 08/21/2010 0831   NITRITE NEGATIVE 01/21/2017 1401   LEUKOCYTESUR NEGATIVE 01/21/2017 1401     ) Recent Results (from the past 240 hour(s))  Culture, blood (Routine X 2) w Reflex to ID Panel     Status: None   Collection Time: 01/15/17  9:30 AM  Result Value Ref Range Status   Specimen Description BLOOD RIGHT FOREARM  Final   Special Requests   Final    BOTTLES DRAWN AEROBIC AND ANAEROBIC Blood Culture adequate volume   Culture NO GROWTH 5 DAYS  Final   Report  Status 01/20/2017 FINAL  Final  Culture, blood (Routine X 2) w Reflex to ID Panel     Status: None   Collection Time: 01/15/17  9:40 AM  Result Value Ref Range Status   Specimen Description BLOOD RIGHT HAND  Final   Special Requests   Final    BOTTLES DRAWN AEROBIC AND ANAEROBIC Blood Culture adequate volume   Culture NO GROWTH 5 DAYS  Final   Report Status 01/20/2017 FINAL  Final  Culture, Urine     Status: Abnormal   Collection Time: 01/15/17 10:51 AM  Result Value Ref Range Status   Specimen Description URINE, RANDOM  Final   Special Requests NONE  Final   Culture (A)  Final    40,000 COLONIES/mL DIPHTHEROIDS(CORYNEBACTERIUM SPECIES) Standardized susceptibility testing for this organism is not available.    Report Status 01/16/2017 FINAL  Final  Culture, expectorated sputum-assessment     Status: None   Collection Time: 01/15/17  1:31 PM  Result Value Ref Range Status   Specimen Description EXPECTORATED SPUTUM  Final   Special Requests NONE  Final   Sputum evaluation THIS SPECIMEN IS ACCEPTABLE FOR SPUTUM CULTURE  Final   Report Status 01/15/2017 FINAL  Final  Culture, respiratory (NON-Expectorated)     Status: None   Collection Time: 01/15/17  1:31 PM  Result Value Ref Range Status    Specimen Description EXPECTORATED SPUTUM  Final   Special Requests NONE Reflexed from [id number]  Final   Gram Stain   Final    MODERATE WBC PRESENT, PREDOMINANTLY PMN RARE GRAM NEGATIVE RODS    Culture   Final    FEW HAEMOPHILUS INFLUENZAE BETA LACTAMASE POSITIVE    Report Status 01/17/2017 FINAL  Final  Respiratory Panel by PCR     Status: None   Collection Time: 01/15/17  4:38 PM  Result Value Ref Range Status   Adenovirus NOT DETECTED NOT DETECTED Final   Coronavirus 229E NOT DETECTED NOT DETECTED Final   Coronavirus HKU1 NOT DETECTED NOT DETECTED Final   Coronavirus NL63 NOT DETECTED NOT DETECTED Final   Coronavirus OC43 NOT DETECTED NOT DETECTED Final   Metapneumovirus NOT DETECTED NOT DETECTED Final   Rhinovirus / Enterovirus NOT DETECTED NOT DETECTED Final   Influenza A NOT DETECTED NOT DETECTED Final   Influenza B NOT DETECTED NOT DETECTED Final   Parainfluenza Virus 1 NOT DETECTED NOT DETECTED Final   Parainfluenza Virus 2 NOT DETECTED NOT DETECTED Final   Parainfluenza Virus 3 NOT DETECTED NOT DETECTED Final   Parainfluenza Virus 4 NOT DETECTED NOT DETECTED Final   Respiratory Syncytial Virus NOT DETECTED NOT DETECTED Final   Bordetella pertussis NOT DETECTED NOT DETECTED Final   Chlamydophila pneumoniae NOT DETECTED NOT DETECTED Final   Mycoplasma pneumoniae NOT DETECTED NOT DETECTED Final  MRSA PCR Screening     Status: None   Collection Time: 01/15/17  4:38 PM  Result Value Ref Range Status   MRSA by PCR NEGATIVE NEGATIVE Final    Comment:        The GeneXpert MRSA Assay (FDA approved for NASAL specimens only), is one component of a comprehensive MRSA colonization surveillance program. It is not intended to diagnose MRSA infection nor to guide or monitor treatment for MRSA infections.       Anti-infectives    Start     Dose/Rate Route Frequency Ordered Stop   01/22/17 1600  levofloxacin (LEVAQUIN) IVPB 750 mg     750 mg 100 mL/hr over 90 Minutes  Intravenous Every 24 hours 01/21/17 1544     01/21/17 1600  levofloxacin (LEVAQUIN) IVPB 750 mg     750 mg 100 mL/hr over 90 Minutes Intravenous  Once 01/21/17 1545 01/21/17 1837       Radiology Studies: Dg Chest Port 1 View  Result Date: 01/22/2017 CLINICAL DATA:  COPD exacerbation. Chest pain and shortness of breath. EXAM: PORTABLE CHEST 1 VIEW COMPARISON:  CT chest 01/21/2017. Chest x-rays 01/21/2017 and earlier. FINDINGS: Cardiac silhouette upper normal in size for AP portable technique. Thoracic aorta mildly atherosclerotic. Prominent mediastinal fat as noted on CT accounts for the apparent superior mediastinal widening. Emphysematous changes throughout both lungs. Prominent bronchovascular markings and mild central peribronchial thickening, unchanged since 01/15/2017 but increased since 10/23/2015. No new pulmonary parenchymal abnormalities. No pleural effusions. No pneumothorax. IMPRESSION: Moderate changes of acute bronchitis and/or asthma without focal airspace pneumonia, superimposed upon COPD/emphysema. Electronically Signed   By: [name] M.D.   On: 01/22/2017 08:09   Dg Chest Portable 1 View  Result Date: 01/21/2017 CLINICAL DATA:  Chest pain, back pain, shortness of breath EXAM: PORTABLE CHEST 1 VIEW COMPARISON:  01/16/2017 FINDINGS: There is mild bilateral interstitial thickening. There is no focal parenchymal opacity. There is no pleural effusion or pneumothorax. There is stable cardiomegaly. The osseous structures are unremarkable. IMPRESSION: Cardiomegaly with mild pulmonary vascular congestion. Electronically Signed   By: [name]   On: 01/21/2017 10:51   Ct Angio Chest/abd/pel For Dissection W And/or W/wo  Result Date: 01/21/2017 CLINICAL DATA:  Chest pain radiating to the back. Short of breath. Symptoms since yesterday. EXAM: CT ANGIOGRAPHY CHEST, ABDOMEN AND PELVIS TECHNIQUE: Multidetector CT imaging through the chest, abdomen and pelvis was performed using the  standard protocol during bolus administration of intravenous contrast. Multiplanar reconstructed images and MIPs were obtained and reviewed to evaluate the vascular anatomy. CONTRAST:  100 cc Isovue 370 COMPARISON:  07/17/2004, CT chest FINDINGS: CTA CHEST FINDINGS Cardiovascular: There is no evidence of aortic dissection, intramural hematoma, or aortic aneurysm. Atherosclerotic calcifications of the aortic arch and great vessels are noted. Great vessels are grossly patent. There is smooth plaque in the proximal left subclavian artery. Vertebral arteries are grossly patent within the confines of the examination. There is no obvious pulmonary thromboembolism. Three vessel coronary artery calcifications. Mediastinum/Nodes: 10 mm short axis diameter subcarinal node on image 75 series 7. There is wall thickening of the lower lobe lobar bronchus bilaterally. There is also wall thickening of lower lobe segmental branches. Small scattered mediastinal nodes. There is wall thickening of the distal esophagus which is moderate. Prominent mediastinal fat. No pericardial effusion. Lungs/Pleura: No pneumothorax. No pleural effusion. Reticulonodular opacities are present in the right upper lobe with a central and peripheral predominant pattern. These this also occurs in the right middle and lower lobes to a lesser degree. The largest nodule is 4 mm in the right upper lobe. See image 17 of series 6. There is a large bleb in the anterior right middle lobe. Musculoskeletal: There are innumerable sclerotic lesions involving bilateral ribs, multiple thoracic vertebral bodies, the sternum, in the manubrium these findings have developed since the prior study. There is no vertebral compression deformity. Review of the MIP images confirms the above findings. CTA ABDOMEN AND PELVIS FINDINGS VASCULAR Aorta: No evidence of dissection. Non aneurysmal and patent. Atherosclerotic calcifications throughout the abdominal aorta are noted. Celiac:  Patent. SMA: Patent. Renals: Single renal arteries are patent. IMA: Patent. Inflow: There are atherosclerotic changes in the iliac vasculature.  Common iliac arteries are patent bilaterally. There is irregular calcified and soft plaque in the proximal right external iliac artery which does not appear flow limiting. Left external iliac artery is patent. Mild atherosclerotic calcifications throughout the bilateral internal iliac arteries. Review of the MIP images confirms the above findings. NON-VASCULAR Hepatobiliary: Liver at and gallbladder are within normal limits. Pancreas: Unremarkable Spleen: Unremarkable. Adrenals/Urinary Tract: There are chronic changes of the kidneys. Adrenal glands are within normal limits. There are simple cysts in the right kidneys. Stomach/Bowel: Stomach is within normal limits. Normal appendix. There is bubbly stool within the ascending colon. No obvious mass in the colon. No evidence of small-bowel obstruction. Sigmoid diverticulosis. There is no definitive wall thickening of the sigmoid colon. The colon is decompressed. There is haziness in the fat to the left of the sigmoid colon of unknown significance. There is also stranding anterior to the left iliopsoas muscle. The left iliopsoas muscle is thickened. See image 251. Lymphatic: There is abnormal left para-aortic and left iliac adenopathy. 15 mm left para- aortic node on image 203 of series 7. 15 mm left common iliac node on image 236. Several other smaller nodes are noted. Reproductive: Brachy therapy pellets in the prostate gland. Prostate is normal in size. Other: No free-fluid. Musculoskeletal: Innumerable sclerotic lesions are present in the lumbar spine and pelvis. There is a sclerotic and destructive process in the left iliac bone. This is associated with thickening of the musculature on either side of the left iliac crest. Findings are worrisome for tumor infiltration of the left iliac bone and tumor infiltration of the  surrounding musculature. See image 250 of series 7 there is slight enlargement of the bone but this is not dramatic making Paget's disease less likely. There is no vertebral compression deformity. Review of the MIP images confirms the above findings. IMPRESSION: Thorax: No evidence of thoracic aortic dissection. Abnormal bronchial wall thickening is in the bilateral lower lobe airways. Findings probably represent an inflammatory process. Bronchoscopy may be helpful Abnormal mediastinal adenopathy.  Malignancy is not excluded. Multiple sclerotic lesions in the thoracic spine, ribs, and sternum. Nonspecific wall thickening of the distal esophagus. There are reticulonodular opacities in the right lung likely representing an inflammatory process. The largest nodule is 4 mm. Malignancy is not excluded. No follow-up needed if patient is low-risk (and has no known or suspected primary neoplasm). Non-contrast chest CT can be considered in 12 months if patient is high-risk. This recommendation follows the consensus statement: Guidelines for Management of Incidental Pulmonary Nodules Detected on CT Images: From the Fleischner Society 2017; Radiology 2017; 284:228-243. Abdomen pelvis: No evidence of aortic dissection. Abnormal adenopathy along the aortic chain and left iliac vessels worrisome for metastatic disease. Multiple sclerotic lesions in the lumbar spine and pelvis worrisome for metastatic disease. Carotid and destructive process involving the left iliac bone with extension into the surrounding musculature. This is worrisome for metastatic disease. Given the brachy therapy pellets in the prostate gland, metastatic prostate cancer is of primary concern. Correlation with PSA level is recommended. Electronically Signed   By: Marybelle Killings M.D.   On: 01/21/2017 13:17        Scheduled Meds: . albuterol  2.5 mg Nebulization Q4H  . allopurinol  300 mg Oral Daily  . apixaban  5 mg Oral BID  .  bisoprolol-hydrochlorothiazide  1 tablet Oral q morning - 10a  . budesonide (PULMICORT) nebulizer solution  0.5 mg Nebulization BID  . citalopram  20 mg Oral QHS  .  diltiazem  30 mg Oral Q12H  . feeding supplement (ENSURE ENLIVE)  237 mL Oral BID BM  . gabapentin  300 mg Oral QHS  . insulin aspart  0-15 Units Subcutaneous TID WC  . insulin aspart  0-5 Units Subcutaneous QHS  . [START ON 01/23/2017] methylPREDNISolone (SOLU-MEDROL) injection  60 mg Intravenous Daily  . sodium chloride flush  3 mL Intravenous Q12H  . [START ON 01/23/2017] umeclidinium-vilanterol  1 puff Inhalation Daily   Continuous Infusions: . levofloxacin (LEVAQUIN) IV       LOS: 1 day    Time spent: 35 min    [place], DO [facility] Pager [phone]  If 7PM-7AM, please contact night-coverage [email or web address] Password [email or web address] 01/22/2017, 2:05 PM

## 2017-01-22 NOTE — Progress Notes (Signed)
Nutrition Brief Note  Patient identified on the Malnutrition Screening Tool (MST) Report. Patient with ~4-5% weight loss within the past 1-3 months, which is not significant for the time frame. Suspect some weight loss is related to volume status. He has had a poor appetite at home, but it has improved since admission, currently consuming 100% of meals and reports being hungry.  Wt Readings from Last 15 Encounters:  01/22/17 269 lb 1.6 oz (122.1 kg)  01/17/17 268 lb 8 oz (121.8 kg)  12/22/16 283 lb 9.6 oz (128.6 kg)  12/16/16 280 lb 12 oz (127.3 kg)  11/06/16 283 lb (128.4 kg)  10/30/16 284 lb 8 oz (129 kg)  03/26/16 297 lb 12.8 oz (135.1 kg)  11/13/15 (!) 302 lb 12 oz (137.3 kg)  10/20/14 (!) 319 lb (144.7 kg)  07/19/14 (!) 321 lb (145.6 kg)  04/19/14 (!) 312 lb (141.5 kg)  01/11/14 (!) 308 lb (139.7 kg)  07/18/13 (!) 305 lb (138.3 kg)  07/04/13 (!) 304 lb (137.9 kg)  04/22/13 (!) 301 lb (136.5 kg)    Body mass index is 38.07 kg/m. Patient meets criteria for class 2 obesity based on current BMI.   Current diet order is heart healthy, patient is consuming approximately 100% of meals at this time. Labs and medications reviewed.   No nutrition interventions warranted at this time. If nutrition issues arise, please consult RD.   Molli Barrows, RD, LDN, [place] Pager [phone] After Hours Pager [phone]

## 2017-01-22 NOTE — Progress Notes (Signed)
PULMONARY / CRITICAL CARE MEDICINE   Name: Scott Rollins MRN: [id number] DOB: Nov 27, 1950    ADMISSION DATE:  01/21/2017 CONSULTATION DATE:  7/11  REFERRING MD:  EDP   CHIEF COMPLAINT:  Acute on chronic respiratory failure   HISTORY OF PRESENT ILLNESS:   66yo male with hx severe COPD, CAD, HTN, OSA just d/c 7/5 after admission for AECOPD.  Returns 7/11 with c/o SOB, mild chest pain, difficulty urinating, low back pain.  In ER was found to be hypercarbic with pCO2 87 and started on bipap.  CT chest and abd did r/o acute issues.  PCCM consulted to determine ICU necessity.   SUBJECTIVE:  Off BiPAP since 0700, now on 3L [place] Denies SOB Back pain improved Patient has concerns over affordable home inhaler regimen.   VITAL SIGNS: BP (!) 148/86   Pulse 67   Temp 98.9 F (37.2 C) (Axillary)   Resp 18   Ht 5' 10.5" (1.791 m)   Wt 269 lb 1.6 oz (122.1 kg)   SpO2 94%   BMI 38.07 kg/m   HEMODYNAMICS:    VENTILATOR SETTINGS: FiO2 (%):  [30 %] 30 %  INTAKE / OUTPUT: I/O last 3 completed shifts: In: 300 [I.V.:300] Out: 775 [Urine:775]  PHYSICAL EXAMINATION: General:  Adult male sitting on side of bed in NAD HEENT: MM pink/dry Neuro: pleasant, AOx3, non focal CV: s1s2 rrr PULM: even/non-labored, lungs bilaterally diminished, no wheeze, 93% on 3L [place], faint posterior bibasilar crackles, speaking in full sentences  GI: obese, soft, non-tender, BS active  Extremities: warm/dry, no edema  Skin: no rashes or lesions   LABS:  BMET  Recent Labs Lab 01/17/17 1353 01/21/17 1026 01/22/17 0234  NA 137 138 139  K 4.3 4.3 5.2*  CL 96* 97* 99*  CO2 33* 29 28  BUN 24* 22* 28*  CREATININE 0.95 1.42* 1.42*  GLUCOSE 119* 112* 108*    Electrolytes  Recent Labs Lab 01/15/17 1246  01/17/17 1353 01/21/17 1026 01/22/17 0234  CALCIUM  --   < > 9.5 8.6* 8.3*  MG 2.1  --   --   --   --   PHOS 4.6  --   --   --   --   < > = values in this interval not  displayed.  CBC  Recent Labs Lab 01/16/17 0328 01/21/17 1026 01/22/17 0234  WBC 10.3 22.7* 15.9*  HGB 13.7 14.4 13.8  HCT 43.8 46.4 45.3  PLT 324 323 263    Coag's No results for input(s): APTT, INR in the last 168 hours.  Sepsis Markers  Recent Labs Lab 01/15/17 0926  01/16/17 2147 01/17/17 0238 01/17/17 7654  LATICACIDVEN  --   < > 3.0* 0.8 1.8  PROCALCITON 0.68  --   --   --  0.23  < > = values in this interval not displayed.  ABG  Recent Labs Lab 01/21/17 1421 01/21/17 2120 01/22/17 0330  PHART 7.220* 7.274* 7.309*  PCO2ART 86.9* 72.3* 64.1*  PO2ART 102.0 76.3* 77.0*    Liver Enzymes No results for input(s): AST, ALT, ALKPHOS, BILITOT, ALBUMIN in the last 168 hours.  Cardiac Enzymes  Recent Labs Lab 01/15/17 1558 01/15/17 2111 01/16/17 0328  TROPONINI 0.04* 0.03* 0.03*    Glucose No results for input(s): GLUCAP in the last 168 hours.  Imaging Dg Chest Port 1 View  Result Date: 01/22/2017 CLINICAL DATA:  COPD exacerbation. Chest pain and shortness of breath. EXAM: PORTABLE CHEST 1 VIEW COMPARISON:  CT chest  01/21/2017. Chest x-rays 01/21/2017 and earlier. FINDINGS: Cardiac silhouette upper normal in size for AP portable technique. Thoracic aorta mildly atherosclerotic. Prominent mediastinal fat as noted on CT accounts for the apparent superior mediastinal widening. Emphysematous changes throughout both lungs. Prominent bronchovascular markings and mild central peribronchial thickening, unchanged since 01/15/2017 but increased since 10/23/2015. No new pulmonary parenchymal abnormalities. No pleural effusions. No pneumothorax. IMPRESSION: Moderate changes of acute bronchitis and/or asthma without focal airspace pneumonia, superimposed upon COPD/emphysema. Electronically Signed   By: [name] M.D.   On: 01/22/2017 08:09   Dg Chest Portable 1 View  Result Date: 01/21/2017 CLINICAL DATA:  Chest pain, back pain, shortness of breath EXAM: PORTABLE  CHEST 1 VIEW COMPARISON:  01/16/2017 FINDINGS: There is mild bilateral interstitial thickening. There is no focal parenchymal opacity. There is no pleural effusion or pneumothorax. There is stable cardiomegaly. The osseous structures are unremarkable. IMPRESSION: Cardiomegaly with mild pulmonary vascular congestion. Electronically Signed   By: [name]   On: 01/21/2017 10:51   Ct Angio Chest/abd/pel For Dissection W And/or W/wo  Result Date: 01/21/2017 CLINICAL DATA:  Chest pain radiating to the back. Short of breath. Symptoms since yesterday. EXAM: CT ANGIOGRAPHY CHEST, ABDOMEN AND PELVIS TECHNIQUE: Multidetector CT imaging through the chest, abdomen and pelvis was performed using the standard protocol during bolus administration of intravenous contrast. Multiplanar reconstructed images and MIPs were obtained and reviewed to evaluate the vascular anatomy. CONTRAST:  100 cc Isovue 370 COMPARISON:  07/17/2004, CT chest FINDINGS: CTA CHEST FINDINGS Cardiovascular: There is no evidence of aortic dissection, intramural hematoma, or aortic aneurysm. Atherosclerotic calcifications of the aortic arch and great vessels are noted. Great vessels are grossly patent. There is smooth plaque in the proximal left subclavian artery. Vertebral arteries are grossly patent within the confines of the examination. There is no obvious pulmonary thromboembolism. Three vessel coronary artery calcifications. Mediastinum/Nodes: 10 mm short axis diameter subcarinal node on image 75 series 7. There is wall thickening of the lower lobe lobar bronchus bilaterally. There is also wall thickening of lower lobe segmental branches. Small scattered mediastinal nodes. There is wall thickening of the distal esophagus which is moderate. Prominent mediastinal fat. No pericardial effusion. Lungs/Pleura: No pneumothorax. No pleural effusion. Reticulonodular opacities are present in the right upper lobe with a central and peripheral predominant  pattern. These this also occurs in the right middle and lower lobes to a lesser degree. The largest nodule is 4 mm in the right upper lobe. See image 17 of series 6. There is a large bleb in the anterior right middle lobe. Musculoskeletal: There are innumerable sclerotic lesions involving bilateral ribs, multiple thoracic vertebral bodies, the sternum, in the manubrium these findings have developed since the prior study. There is no vertebral compression deformity. Review of the MIP images confirms the above findings. CTA ABDOMEN AND PELVIS FINDINGS VASCULAR Aorta: No evidence of dissection. Non aneurysmal and patent. Atherosclerotic calcifications throughout the abdominal aorta are noted. Celiac: Patent. SMA: Patent. Renals: Single renal arteries are patent. IMA: Patent. Inflow: There are atherosclerotic changes in the iliac vasculature. Common iliac arteries are patent bilaterally. There is irregular calcified and soft plaque in the proximal right external iliac artery which does not appear flow limiting. Left external iliac artery is patent. Mild atherosclerotic calcifications throughout the bilateral internal iliac arteries. Review of the MIP images confirms the above findings. NON-VASCULAR Hepatobiliary: Liver at and gallbladder are within normal limits. Pancreas: Unremarkable Spleen: Unremarkable. Adrenals/Urinary Tract: There are chronic changes of the kidneys.  Adrenal glands are within normal limits. There are simple cysts in the right kidneys. Stomach/Bowel: Stomach is within normal limits. Normal appendix. There is bubbly stool within the ascending colon. No obvious mass in the colon. No evidence of small-bowel obstruction. Sigmoid diverticulosis. There is no definitive wall thickening of the sigmoid colon. The colon is decompressed. There is haziness in the fat to the left of the sigmoid colon of unknown significance. There is also stranding anterior to the left iliopsoas muscle. The left iliopsoas muscle  is thickened. See image 251. Lymphatic: There is abnormal left para-aortic and left iliac adenopathy. 15 mm left para- aortic node on image 203 of series 7. 15 mm left common iliac node on image 236. Several other smaller nodes are noted. Reproductive: Brachy therapy pellets in the prostate gland. Prostate is normal in size. Other: No free-fluid. Musculoskeletal: Innumerable sclerotic lesions are present in the lumbar spine and pelvis. There is a sclerotic and destructive process in the left iliac bone. This is associated with thickening of the musculature on either side of the left iliac crest. Findings are worrisome for tumor infiltration of the left iliac bone and tumor infiltration of the surrounding musculature. See image 250 of series 7 there is slight enlargement of the bone but this is not dramatic making Paget's disease less likely. There is no vertebral compression deformity. Review of the MIP images confirms the above findings. IMPRESSION: Thorax: No evidence of thoracic aortic dissection. Abnormal bronchial wall thickening is in the bilateral lower lobe airways. Findings probably represent an inflammatory process. Bronchoscopy may be helpful Abnormal mediastinal adenopathy.  Malignancy is not excluded. Multiple sclerotic lesions in the thoracic spine, ribs, and sternum. Nonspecific wall thickening of the distal esophagus. There are reticulonodular opacities in the right lung likely representing an inflammatory process. The largest nodule is 4 mm. Malignancy is not excluded. No follow-up needed if patient is low-risk (and has no known or suspected primary neoplasm). Non-contrast chest CT can be considered in 12 months if patient is high-risk. This recommendation follows the consensus statement: Guidelines for Management of Incidental Pulmonary Nodules Detected on CT Images: From the Fleischner Society 2017; Radiology 2017; 284:228-243. Abdomen pelvis: No evidence of aortic dissection. Abnormal adenopathy  along the aortic chain and left iliac vessels worrisome for metastatic disease. Multiple sclerotic lesions in the lumbar spine and pelvis worrisome for metastatic disease. Carotid and destructive process involving the left iliac bone with extension into the surrounding musculature. This is worrisome for metastatic disease. Given the brachy therapy pellets in the prostate gland, metastatic prostate cancer is of primary concern. Correlation with PSA level is recommended. Electronically Signed   By: Marybelle Killings M.D.   On: 01/21/2017 13:17     STUDIES:  CTA chest/abd/pelvis 7/11>>> No evidence of thoracic aortic dissection.  Abnormal bronchial wall thickening is in the bilateral lower lobe airways. Findings probably represent an inflammatory process. Abnormal mediastinal adenopathy.  Malignancy is not excluded. Multiple sclerotic lesions in the thoracic spine, ribs, and sternum. Nonspecific wall thickening of the distal esophagus. There are reticulonodular opacities in the right lung likely representing an inflammatory process. The largest nodule is 4 mm. Malignancy is not excluded.  Abnormal adenopathy along the aortic chain and left iliac vessels worrisome for metastatic disease. Multiple sclerotic lesions in the lumbar spine and pelvis worrisome for metastatic disease. Carotid and destructive process involving the left iliac bone with extension into the surrounding musculature. This is worrisome for metastatic disease.  CULTURES: none  ANTIBIOTICS: 7/11  levaquin >>  SIGNIFICANT EVENTS: 7/5 discharged for AECOPD 7/11 Admit   LINES/TUBES: PIV  DISCUSSION: 66yo male with hx COPD presenting with AECOPD needing bipap  ASSESSMENT / PLAN:  Acute on chronic respiratory failure  AECOPD  OSA on CPAP Concern for metastatic lesions on chest CT PLAN -   D/c bipap, continue CPAP for OSA at HS Wean O2 for sats 88-94% Decrease solumedrol to 60mg  daily Albuterol prn  Continue  Budesonide BID D/c dulera-> transition to Anoro now off BiPAP Trend CXR  Pulmonary hygiene with IS and mobilize as able Continue levaquin dx/2 for acute bronchitis while on BiPAP/ no focal opacity on CXR, narrow when able Patient has appointment already in place with Dr. Melvyn Novas on 7/20 at 2:45 PM   Concern for metastatic prostate cancer  -pt and wife seem to already know that this is true and have missed some f/u for prostate ca.  PSA has been steadily climbing according to wife.  Would explain significant back pain.  PLAN -  Oncology consult per primary team Pain management per primary team   FAMILY  - Updates:  Pt updated at bedside.   PCCM will continue to follow  Kennieth Rad, AGACNP-BC [facility] Pgr: [phone] or if no answer [phone] 01/22/2017, 9:20 AM

## 2017-01-22 NOTE — Progress Notes (Addendum)
Scott Rollins  Telephone:([phone]) [place]  DOB: 05/19/1951  MR#: [id number]  CSN#: [id number]    Requesting Physician: [facility]  Patient Care Team: [name], MD as PCP - General Rana Snare, MD as Consulting Physician (Urology) Hennie Duos, MD as Consulting Physician (Rheumatology) Ladene Artist, MD as Consulting Physician (Gastroenterology) [name], [facility] as [place] [facility] (Pharmacist) Thompson Grayer, MD as Consulting Physician (Cardiology) Luretha Rued, RN as [place] [facility]  Reason for consult: metastatic prostate cancer to bone  History of present illness:   66 yo male with PMH of prostate cancer diagnosed in 2012, s/p seed implant and radiation (per pt), and one year lupron. He has been following up with his urologist Dr. [name] at [facility] urology. His last PSA was elevated in October 2017, but he did not follow up for further work up or treatment. He has developed intermittent right rib pain and left hip pain for the past one year, he does not like mycotic's, has been using cannabis for this. His CT scan during this hospitalization showed diffuse sclerotic bone metastasis. PSA: 130.00 today. I was called to evaluate him.  He has presented to the hospital three times within this year for his severe COPD. He uses his inhaler when needed.The patient previously had prostate cancer. He underwent radiation and Lupron for 1 year. He has been having intermittent back pain that feels as if someone is stabbing him in the back. He also has some pain in his left hip and thigh. He has taken 10mg  oxycodone but he does not like. He has also tried [employer] which thinks works better. About a year ago he started smoking cannabis which he says improved his pain.   He denies any kidney problems.     MEDICAL HISTORY:  Past Medical History:   Diagnosis Date  . Anginal pain ([place])   . Anxiety   . Arthritis    "hips, knees, thoracic back" (01/22/2017)  . CAP (community acquired pneumonia) 11/2016  . 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 COPD (chronic obstructive pulmonary disease) ([place])    "pearl study" pt thinks he is on Symbicort; pt noncompliant with COPD controller meds on/off due to financial reasons.  . Coronary atherosclerosis of unspecified type of vessel, native or graft 2005   MI, s/p PCI with stent (pt reports 20+ interventions in the past, most recent cat 6/08 showed patent stents).  Normal LV function.  Nuclear stress test NEG 8/09.  . Depression    ?bipolar dx by psychiatrist?  . Diverticulosis 2006  . ED (erectile dysfunction)   . Gout   . Gouty arthritis    Dr. Amil Amen  . Heart murmur   . Hematochezia 09/2012   Hyperplastic polyp, diverticulosis, and internal hemorrhoids found on colonoscopy 10/2012  . History of hiatal hernia   . Hyperlipidemia    Hx of multiple statin intolerance  . Hypertension   . 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 Metastasis from malignant neoplasm of prostate ([place])    "to spine & left hip; dx'd 01/21/2017"  . Migraine    "none in the 2000s" (01/15/2017)  . Mitral valve prolapse ~ 1981  . Myocardial infarction [facility]) ~ 1993- 2005 X 3-4   (01/15/2017)  . Neuropathic pain of both feet 2015/2016   Vit B12 and A1c  normal 10/2014  . Obesities, morbid ([place])   . On home oxygen therapy    "2L prn" (01/15/2017)  . OSA on CPAP    w/oxygen (01/15/2017)  . Osteoarthrosis, unspecified whether generalized or localized, unspecified site    DDD  . PAF (paroxysmal atrial fibrillation) ([place]) 12/2016   Started in the context of resp illness/lots of bronchodilators.  Started eliquis and cardizem during hospitalization 01/2017 (Dr. Rayann Heman).  . Pre-diabetes 2016/17  . Prostate cancer ([place]) 09/2010   Localized, high risk disease (Dr. Kimbrough/Grapey):   s/p 5 wks ext bm rad + seed boost, as well as hormone blockade x 1 yr.  Biochemical recurrence 11/2015--urologist did bone scan and spots on T spine and L iliac region came up, pt needs plain films of pelvis/T spine to see if DJD correlate present.  He'll eventually need to resume androgen depriv therapy.  . Radiation    Pelvic--for prostate ca: 25 treatments/01/2011  . Seizure disorder ([place]) 08-15-2011   Deja vu sensations: no w/u.  These stopped when neurontin 300mg  tid was started for a different reason.  . Status post chemotherapy    10/2010 thru 06/2011    SURGICAL HISTORY: Past Surgical History:  Procedure Laterality Date  . CARDIAC CATHETERIZATION  23 caths  . COLONOSCOPY WITH PROPOFOL N/A 04/05/2013   Dr. Fuller Plan.  Polypectomy (hyperplastic--recall 10 yrs).  Moderate diverticulosis, +internal hemorrhoids.  No radiation proctitis.  . CORONARY ANGIOPLASTY    . CORONARY ANGIOPLASTY WITH STENT PLACEMENT     "5-6 stents" (01/15/2017)  . HERNIA REPAIR    . HOT HEMOSTASIS N/A 04/05/2013   Procedure: HOT HEMOSTASIS (ARGON PLASMA COAGULATION/BICAP);  Surgeon: Ladene Artist, MD;  Location: [facility];  Service: Endoscopy;  Laterality: N/A;  . INSERTION PROSTATE RADIATION SEED  02/24/2011  . TRANSTHORACIC ECHOCARDIOGRAM  12/2016   EF 55-60%, moderate LVH, grd I DD, mild LA dilation.  [facility] Kitchen UMBILICAL HERNIA REPAIR      SOCIAL HISTORY: Social History   Social History  . Marital status: Married    Spouse name: [name]  . Number of children: N/A  . Years of education: N/A   Occupational History  . [employer]   Social History Main Topics  . Smoking status: Former Smoker    Packs/day: 1.00    Years: 54.00    Types: Cigarettes    Quit date: 12/31/2016  . Smokeless tobacco: Never Used  . Alcohol use Yes     Comment: 01/22/2017 "once q 2-3 months; a beer or glass of wine"  . Drug use: Yes    Types: Marijuana     Comment: 01/22/2017 "nothing in the last several weeks"  . Sexual  activity: No   Other Topics Concern  . Not on file   Social History Narrative   ** Merged History Encounter **   Lives in [place] but also enjoys spending time at [place]: Family History  Problem Relation Age of Onset  . Arthritis Mother   . Heart disease Father   . Bipolar disorder Father     ALLERGIES:  is allergic to amitriptyline hcl and ezetimibe.  MEDICATIONS:  Current Facility-Administered Medications  Medication Dose Route Frequency Provider Last Rate Last Dose  . acetaminophen (TYLENOL) tablet 650 mg  650 mg Oral Q6H PRN Radene Gunning, NP       Or  . acetaminophen (TYLENOL) suppository 650 mg  650 mg Rectal Q6H PRN Radene Gunning,  NP      . albuterol (PROVENTIL) (2.5 MG/3ML) 0.083% nebulizer solution 2.5 mg  2.5 mg Nebulization Q6H Vann, Jessica U, DO      . albuterol (PROVENTIL) (2.5 MG/3ML) 0.083% nebulizer solution 2.5 mg  2.5 mg Nebulization Q4H PRN Vann, Jessica U, DO      . allopurinol (ZYLOPRIM) tablet 300 mg  300 mg Oral Daily Radene Gunning, NP   300 mg at 01/22/17 0904  . apixaban (ELIQUIS) tablet 5 mg  5 mg Oral BID Radene Gunning, NP   5 mg at 01/22/17 0900  . bisoprolol-hydrochlorothiazide (ZIAC) 5-6.25 MG per tablet 1 tablet  1 tablet Oral q morning - 10a Black, Lezlie Octave, NP   1 tablet at 01/22/17 0901  . budesonide (PULMICORT) nebulizer solution 0.5 mg  0.5 mg Nebulization BID Darlina Sicilian A, NP   0.5 mg at 01/22/17 0725  . citalopram (CELEXA) tablet 20 mg  20 mg Oral QHS Radene Gunning, NP   20 mg at 01/21/17 2113  . dextromethorphan-guaiFENesin (MUCINEX DM) 30-600 MG per 12 hr tablet 1 tablet  1 tablet Oral BID PRN Radene Gunning, NP      . diltiazem (CARDIZEM) tablet 30 mg  30 mg Oral Q12H Radene Gunning, NP   30 mg at 01/22/17 0904  . feeding supplement (ENSURE ENLIVE) (ENSURE ENLIVE) liquid 237 mL  237 mL Oral BID BM Radene Gunning, NP   237 mL at 01/22/17 1731  . gabapentin (NEURONTIN) capsule 300 mg  300 mg Oral  QHS Radene Gunning, NP   300 mg at 01/21/17 2114  . HYDROcodone-acetaminophen (NORCO/VICODIN) 5-325 MG per tablet 1-2 tablet  1-2 tablet Oral Q4H PRN Black, Karen M, NP      . insulin aspart (novoLOG) injection 0-15 Units  0-15 Units Subcutaneous TID WC Vann, Jessica U, DO   1 Units at 01/22/17 1731  . insulin aspart (novoLOG) injection 0-5 Units  0-5 Units Subcutaneous QHS Vann, Jessica U, DO      . levofloxacin (LEVAQUIN) IVPB 750 mg  750 mg Intravenous Q24H Whiteheart, Kathryn A, NP 100 mL/hr at 01/22/17 1732 750 mg at 01/22/17 1732  . [START ON 01/23/2017] methylPREDNISolone sodium succinate (SOLU-MEDROL) 125 mg/2 mL injection 60 mg  60 mg Intravenous Daily Jennelle Human B, NP      . ondansetron (ZOFRAN) tablet 4 mg  4 mg Oral Q6H PRN Radene Gunning, NP       Or  . ondansetron [facility]) injection 4 mg  4 mg Intravenous Q6H PRN Black, Lezlie Octave, NP      . sodium chloride flush (NS) 0.9 % injection 3 mL  3 mL Intravenous Q12H Radene Gunning, NP   3 mL at 01/22/17 1000  . [START ON 01/23/2017] umeclidinium-vilanterol (ANORO ELLIPTA) 62.5-25 MCG/INH 1 puff  1 puff Inhalation Daily Jennelle Human B, NP      . zolpidem (AMBIEN) tablet 5 mg  5 mg Oral QHS PRN Black, Lezlie Octave, NP        REVIEW OF SYSTEMS:   Constitutional: Denies fevers, chills or abnormal night sweats Eyes: Denies blurriness of vision, double vision or watery eyes Ears, nose, mouth, throat, and face: Denies mucositis or sore throat Respiratory: Denies cough, dyspnea or wheezes Cardiovascular: Denies palpitation, chest discomfort or lower extremity swelling Gastrointestinal:  Denies nausea, heartburn or change in bowel habits Skin: Denies abnormal skin rashes Lymphatics: Denies new lymphadenopathy or easy bruising Neurological:Denies numbness, tingling or new weaknesses  Behavioral/Psych: Mood is stable, no new changes  All other systems were reviewed with the patient and are negative.  PHYSICAL EXAMINATION: ECOG PERFORMANCE  STATUS: 3 - Symptomatic, >50% confined to bed  Vitals:   01/22/17 0904 01/22/17 1330  BP: (!) 166/89 (!) 150/80  Pulse:  69  Resp:  15  Temp:  98.2 F (36.8 C)   Filed Weights   01/21/17 1019 01/21/17 1838 01/22/17 0500  Weight: 296 lb 8.3 oz (134.5 kg) 274 lb 6.4 oz (124.5 kg) 269 lb 1.6 oz (122.1 kg)    GENERAL:alert, no distress and comfortable SKIN: skin color, texture, turgor are normal, no rashes or significant lesions EYES: normal, conjunctiva are pink and non-injected, sclera clear OROPHARYNX:no exudate, no erythema and lips, buccal mucosa, and tongue normal  NECK: supple, thyroid normal size, non-tender, without nodularity LYMPH:  no palpable lymphadenopathy in the cervical, axillary or inguinal LUNGS: clear to auscultation and percussion with normal breathing effort HEART: regular rate & rhythm and no murmurs and no lower extremity edema ABDOMEN:abdomen soft, non-tender and normal bowel sounds Musculoskeletal:no cyanosis of digits and no clubbing  PSYCH: alert & oriented x 3 with fluent speech NEURO: no focal motor/sensory deficits  LABORATORY DATA:  I have reviewed the data as listed Lab Results  Component Value Date   WBC 15.9 (H) 01/22/2017   HGB 13.8 01/22/2017   HCT 45.3 01/22/2017   MCV 90.8 01/22/2017   PLT 263 01/22/2017    Recent Labs  11/04/16 2253  11/05/16 0609  01/15/17 0630  01/21/17 1026 01/22/17 0234 01/22/17 1154  NA 130*  < > 135  < > 137  < > 138 139 136  K 3.5  < > 3.8  < > 4.2  < > 4.3 5.2* 4.5  CL 90*  < > 95*  < > 95*  < > 97* 99* 95*  CO2 25  --  26  < > 25  < > 29 28 28   GLUCOSE 125*  < > 168*  < > 99  < > 112* 108* 308*  BUN 21*  < > 21*  < > 23*  < > 22* 28* 34*  CREATININE 1.20  < > 1.23  < > 1.17  < > 1.42* 1.42* 1.40*  CALCIUM 8.5*  --  8.6*  < > 9.1  < > 8.6* 8.3* 8.6*  GFRNONAA >60  --  59*  < > >60  < > 50* 50* 50*  GFRAA >60  --  >60  < > >60  < > 58* 58* 59*  PROT 6.9  --  6.7  --  7.4  --   --   --   --   ALBUMIN  3.3*  --  3.2*  --  3.3*  --   --   --   --   AST 28  --  32  --  20  --   --   --   --   ALT 16*  --  19  --  19  --   --   --   --   ALKPHOS 123  --  122  --  180*  --   --   --   --   BILITOT 0.8  --  0.7  --  2.7*  --   --   --   --   < > = values in this interval not displayed.  RADIOGRAPHIC STUDIES: I have personally reviewed the radiological images as  listed and agreed with the findings in the report. X-ray Chest Pa And Lateral  Result Date: 01/16/2017 CLINICAL DATA:  Shortness of breath, cough, and congestion for 5 days, headache today, history hypertension, coronary artery disease post MI and stenting, COPD, smoker, hyperlipidemia, prostate cancer EXAM: CHEST  2 VIEW COMPARISON:  01/15/2017 and 11/04/2016 Correlation: CT chest 07/17/2004 FINDINGS: Minimal enlargement of cardiac silhouette with pulmonary vascular congestion. Stable mediastinal contours with a prominent epicardial fat pad at the RIGHT cardiophrenic angle unchanged since 07/17/2004. Accentuated interstitial markings which appear increased since 11/04/2016 favor mild pulmonary edema. No segmental consolidation, pleural effusion or pneumothorax. Bones demineralized. IMPRESSION: Question mild pulmonary edema. Electronically Signed   By: Lavonia Dana M.D.   On: 01/16/2017 08:34   Dg Chest Port 1 View  Result Date: 01/22/2017 CLINICAL DATA:  COPD exacerbation. Chest pain and shortness of breath. EXAM: PORTABLE CHEST 1 VIEW COMPARISON:  CT chest 01/21/2017. Chest x-rays 01/21/2017 and earlier. FINDINGS: Cardiac silhouette upper normal in size for AP portable technique. Thoracic aorta mildly atherosclerotic. Prominent mediastinal fat as noted on CT accounts for the apparent superior mediastinal widening. Emphysematous changes throughout both lungs. Prominent bronchovascular markings and mild central peribronchial thickening, unchanged since 01/15/2017 but increased since 10/23/2015. No new pulmonary parenchymal abnormalities. No pleural  effusions. No pneumothorax. IMPRESSION: Moderate changes of acute bronchitis and/or asthma without focal airspace pneumonia, superimposed upon COPD/emphysema. Electronically Signed   By: [name] M.D.   On: 01/22/2017 08:09   Dg Chest Portable 1 View  Result Date: 01/21/2017 CLINICAL DATA:  Chest pain, back pain, shortness of breath EXAM: PORTABLE CHEST 1 VIEW COMPARISON:  01/16/2017 FINDINGS: There is mild bilateral interstitial thickening. There is no focal parenchymal opacity. There is no pleural effusion or pneumothorax. There is stable cardiomegaly. The osseous structures are unremarkable. IMPRESSION: Cardiomegaly with mild pulmonary vascular congestion. Electronically Signed   By: [name]   On: 01/21/2017 10:51   Dg Chest Port 1 View  Result Date: 01/15/2017 CLINICAL DATA:  Acute onset of cough and intermittent tachycardia. Atrial fibrillation. Initial encounter. EXAM: PORTABLE CHEST 1 VIEW COMPARISON:  Chest radiograph performed 12/22/2016 FINDINGS: The lungs are well-aerated. Mild bibasilar opacities likely reflect atelectasis. There is no evidence of pleural effusion or pneumothorax. The cardiomediastinal silhouette is within normal limits. No acute osseous abnormalities are seen. IMPRESSION: Mild bibasilar opacities likely reflect atelectasis. Lungs otherwise clear. Electronically Signed   By: Garald Balding M.D.   On: 01/15/2017 06:57   Ct Angio Chest/abd/pel For Dissection W And/or W/wo  Result Date: 01/21/2017 CLINICAL DATA:  Chest pain radiating to the back. Short of breath. Symptoms since yesterday. EXAM: CT ANGIOGRAPHY CHEST, ABDOMEN AND PELVIS TECHNIQUE: Multidetector CT imaging through the chest, abdomen and pelvis was performed using the standard protocol during bolus administration of intravenous contrast. Multiplanar reconstructed images and MIPs were obtained and reviewed to evaluate the vascular anatomy. CONTRAST:  100 cc Isovue 370 COMPARISON:  07/17/2004, CT chest  FINDINGS: CTA CHEST FINDINGS Cardiovascular: There is no evidence of aortic dissection, intramural hematoma, or aortic aneurysm. Atherosclerotic calcifications of the aortic arch and great vessels are noted. Great vessels are grossly patent. There is smooth plaque in the proximal left subclavian artery. Vertebral arteries are grossly patent within the confines of the examination. There is no obvious pulmonary thromboembolism. Three vessel coronary artery calcifications. Mediastinum/Nodes: 10 mm short axis diameter subcarinal node on image 75 series 7. There is wall thickening of the lower lobe lobar bronchus bilaterally. There is also  wall thickening of lower lobe segmental branches. Small scattered mediastinal nodes. There is wall thickening of the distal esophagus which is moderate. Prominent mediastinal fat. No pericardial effusion. Lungs/Pleura: No pneumothorax. No pleural effusion. Reticulonodular opacities are present in the right upper lobe with a central and peripheral predominant pattern. These this also occurs in the right middle and lower lobes to a lesser degree. The largest nodule is 4 mm in the right upper lobe. See image 17 of series 6. There is a large bleb in the anterior right middle lobe. Musculoskeletal: There are innumerable sclerotic lesions involving bilateral ribs, multiple thoracic vertebral bodies, the sternum, in the manubrium these findings have developed since the prior study. There is no vertebral compression deformity. Review of the MIP images confirms the above findings. CTA ABDOMEN AND PELVIS FINDINGS VASCULAR Aorta: No evidence of dissection. Non aneurysmal and patent. Atherosclerotic calcifications throughout the abdominal aorta are noted. Celiac: Patent. SMA: Patent. Renals: Single renal arteries are patent. IMA: Patent. Inflow: There are atherosclerotic changes in the iliac vasculature. Common iliac arteries are patent bilaterally. There is irregular calcified and soft plaque in  the proximal right external iliac artery which does not appear flow limiting. Left external iliac artery is patent. Mild atherosclerotic calcifications throughout the bilateral internal iliac arteries. Review of the MIP images confirms the above findings. NON-VASCULAR Hepatobiliary: Liver at and gallbladder are within normal limits. Pancreas: Unremarkable Spleen: Unremarkable. Adrenals/Urinary Tract: There are chronic changes of the kidneys. Adrenal glands are within normal limits. There are simple cysts in the right kidneys. Stomach/Bowel: Stomach is within normal limits. Normal appendix. There is bubbly stool within the ascending colon. No obvious mass in the colon. No evidence of small-bowel obstruction. Sigmoid diverticulosis. There is no definitive wall thickening of the sigmoid colon. The colon is decompressed. There is haziness in the fat to the left of the sigmoid colon of unknown significance. There is also stranding anterior to the left iliopsoas muscle. The left iliopsoas muscle is thickened. See image 251. Lymphatic: There is abnormal left para-aortic and left iliac adenopathy. 15 mm left para- aortic node on image 203 of series 7. 15 mm left common iliac node on image 236. Several other smaller nodes are noted. Reproductive: Brachy therapy pellets in the prostate gland. Prostate is normal in size. Other: No free-fluid. Musculoskeletal: Innumerable sclerotic lesions are present in the lumbar spine and pelvis. There is a sclerotic and destructive process in the left iliac bone. This is associated with thickening of the musculature on either side of the left iliac crest. Findings are worrisome for tumor infiltration of the left iliac bone and tumor infiltration of the surrounding musculature. See image 250 of series 7 there is slight enlargement of the bone but this is not dramatic making Paget's disease less likely. There is no vertebral compression deformity. Review of the MIP images confirms the above  findings. IMPRESSION: Thorax: No evidence of thoracic aortic dissection. Abnormal bronchial wall thickening is in the bilateral lower lobe airways. Findings probably represent an inflammatory process. Bronchoscopy may be helpful Abnormal mediastinal adenopathy.  Malignancy is not excluded. Multiple sclerotic lesions in the thoracic spine, ribs, and sternum. Nonspecific wall thickening of the distal esophagus. There are reticulonodular opacities in the right lung likely representing an inflammatory process. The largest nodule is 4 mm. Malignancy is not excluded. No follow-up needed if patient is low-risk (and has no known or suspected primary neoplasm). Non-contrast chest CT can be considered in 12 months if patient is high-risk. This  recommendation follows the consensus statement: Guidelines for Management of Incidental Pulmonary Nodules Detected on CT Images: From the Fleischner Society 2017; Radiology 2017; 284:228-243. Abdomen pelvis: No evidence of aortic dissection. Abnormal adenopathy along the aortic chain and left iliac vessels worrisome for metastatic disease. Multiple sclerotic lesions in the lumbar spine and pelvis worrisome for metastatic disease. Carotid and destructive process involving the left iliac bone with extension into the surrounding musculature. This is worrisome for metastatic disease. Given the brachy therapy pellets in the prostate gland, metastatic prostate cancer is of primary concern. Correlation with PSA level is recommended. Electronically Signed   By: Marybelle Killings M.D.   On: 01/21/2017 13:17    ASSESSMENT & PLAN:  66 yo Caucasian male, with past medical history of prostate cancer, severe COPD on home oxygen, CAD, hypertension, OSA, age of fibrillation, was admitted for COPD exacerbation.  1. Prostate cancer metastasized to bone 2. Severe COPD exacerbation, improved 3. OSA 4. CAD, s/p multiple stent placement 5. AF 6. AKI 7. HTN  8. Right rib and left hip pain, secondary to  bone metastasis   Recommendations: -I have reviewed his CT scan, and the lab results. Giving the significant elevated PSA, and diffuse sclerotic bone lesions on the CT scan, this is diagnostic for metastatic prostate cancer. I do not think he needs biopsy to confirm.  -will get a bone scan for staging and baseline scan for future reference to evaluate treatment response  -We discussed that his cancer is not curable at this stage, but very treatable. -I recommend testosterone deprivation as first-line treatment. We discussed the options of medical and surgical orchiectomy.  - will start him on lupron injection and casodex tomorrow. Potential side effects discussed with pt, he agrees to proceed. He previously received Lupron injection in the adjuvant setting. I will place the orders  -I will schedule his clinic appointment with my partner Dr.Shadad ASAP -I will follow up as needed before his discharge    All questions were answered. The patient knows to call the clinic with any problems, questions or concerns.  I spent 60 mins for the consultation, >50% on face-to-face counseling.    Truitt Merle, MD 01/22/2017 5:44 PM

## 2017-01-22 NOTE — Progress Notes (Signed)
RT NOTE:  CPAP setup @ bedside per MD order to switch from BIPAP. Pt believes home setting is 10cm H20. He wear FFM. Humidity chamber filled with sterile H20. Pt will let RN know when he is ready for sleep. RN agrees to switch O2 to CPAP for patient. RT available if needed.

## 2017-01-22 NOTE — Consult Note (Signed)
   [facility] CM Inpatient Consult   01/22/2017  WILDER KUROWSKI [birth year] [id number]  Aware of active patient with [facility].  Patient is currently active with [facility] for chronic disease management services.  Patient has been engaged by a [employer] and and Pharmacist.  Our community based plan of care has focused on disease management and community resource support.  Patient will receive a post discharge transition of care call and will be evaluated for monthly home visits for assessments and disease process education.   Came by and spoke with the patient.  He continues to need support with his respiratory medication regimens and cost.   Made Inpatient Case Manager aware that [facility] following. Of note, [facility] Care Management services does not replace or interfere with any services that are needed or arranged by inpatient case management or social work.  For additional questions or referrals please contact:  Natividad Brood, RN BSN [facility] Liaison  [phone] business mobile phone Toll free office [phone]

## 2017-01-22 NOTE — Evaluation (Signed)
Occupational Therapy Evaluation Patient Details Name: Scott Rollins MRN: [id number] DOB: 15-Feb-1951 Today's Date: 01/22/2017    History of Present Illness 66yo male with hx severe COPD, CAD, HTN, OSA just d/c 7/5 after admission for AECOPD.  Returns 7/11 with c/o SOB, mild chest pain, difficulty urinating, low back pain.  In ER was found to be hypercarbic with pCO2 87 and started on bipap.  CT chest and abd did r/o acute issues.   Clinical Impression   Pt was assisted for LB ADL and sometimes ambulated with AD prior to admission. He avoided the shower due to shortness of breath and fatigue. Pt admits to a sedentary lifestyle. Pt reporting feelng slightly unsteady with ambulation. Began education in energy conservation and pursed lip breathing. Will follow acutely. Agree with PT's recommendation of Pulmonary Rehab.     Follow Up Recommendations  No OT follow up    Equipment Recommendations       Recommendations for Other Services       Precautions / Restrictions Precautions Precautions: Other (comment) Precaution Comments: monitor o2 sats Restrictions Weight Bearing Restrictions: No      Mobility Bed Mobility               General bed mobility comments: pt in chair upon arrival  Transfers Overall transfer level: Needs assistance Equipment used: None Transfers: Sit to/from Stand Sit to Stand: Supervision         General transfer comment: S for safety    Balance Overall balance assessment: Needs assistance   Sitting balance-Leahy Scale: Good       Standing balance-Leahy Scale: Fair                             ADL either performed or assessed with clinical judgement   ADL Overall ADL's : Needs assistance/impaired Eating/Feeding: Independent;Sitting   Grooming: Wash/dry hands;Standing;Min guard   Upper Body Bathing: Set up;Sitting   Lower Body Bathing: Minimal assistance;Sit to/from stand   Upper Body Dressing : Set up;Sitting    Lower Body Dressing: Maximal assistance;Sit to/from stand   Toilet Transfer: Ambulation;Min guard   Toileting- Water quality scientist and Hygiene: Sit to/from stand;Min guard       Functional mobility during ADLs: Min guard General ADL Comments: Began education in energy conservation strategies.     Vision Patient Visual Report: No change from baseline       Perception     Praxis      Pertinent Vitals/Pain Pain Assessment: No/denies pain     Hand Dominance Right   Extremity/Trunk Assessment Upper Extremity Assessment Upper Extremity Assessment: Overall WFL for tasks assessed   Lower Extremity Assessment Lower Extremity Assessment: Defer to PT evaluation   Cervical / Trunk Assessment Cervical / Trunk Assessment: Normal   Communication Communication Communication: No difficulties   Cognition Arousal/Alertness: Awake/alert Behavior During Therapy: WFL for tasks assessed/performed Overall Cognitive Status: Within Functional Limits for tasks assessed                                     General Comments       Exercises     Shoulder Instructions      Home Living Family/patient expects to be discharged to:: Private residence Living Arrangements: Spouse/significant other;Children Available Help at Discharge: Family;Available 24 hours/day Type of Home: House Home Access: Stairs to enter [employer] of Steps:  4 Entrance Stairs-Rails: None Home Layout: Multi-level Alternate Level Stairs-Number of Steps: 4 steps to get to bedroom from main level  9-10 to go downstairs from main level Alternate Level Stairs-Rails: Right Bathroom Shower/Tub: Tub/shower unit   Bathroom Toilet: Handicapped height     Home Equipment: Environmental consultant - 2 wheels;Cane - single point;Other (comment) (sits on a step stool in the shower, has home 02)   Additional Comments: "To be honest, I'm not real mobile"      Prior Functioning/Environment Level of Independence:  Needs assistance    ADL's / Homemaking Assistance Needed: assist for LB dressing and IADL   Comments: occasional use of cane and RW        OT Problem List: Decreased activity tolerance      OT Treatment/Interventions: Self-care/ADL training;DME and/or AE instruction;Energy conservation;Patient/family education    OT Goals(Current goals can be found in the care plan section) Acute Rehab OT Goals Patient Stated Goal: go home OT Goal Formulation: With patient Time For Goal Achievement: 01/29/17 Potential to Achieve Goals: Good ADL Goals Pt Will Perform Grooming: with modified independence;standing Pt Will Perform Lower Body Bathing: with modified independence;with adaptive equipment;sit to/from stand Pt Will Perform Lower Body Dressing: with modified independence;with adaptive equipment;sit to/from stand Pt Will Transfer to Toilet: with modified independence;ambulating Pt Will Perform Toileting - Clothing Manipulation and hygiene: with modified independence;sit to/from stand Pt Will Perform Tub/Shower Transfer: Tub transfer;with supervision;ambulating;shower seat Additional ADL Goal #1: Pt will state at least 3 energy conservation strategies following instruction.  OT Frequency: Min 2X/week   Barriers to D/C:            Co-evaluation              AM-PAC PT "6 Clicks" Daily Activity     Outcome Measure Help from another person eating meals?: None Help from another person taking care of personal grooming?: A Little Help from another person toileting, which includes using toliet, bedpan, or urinal?: A Little Help from another person bathing (including washing, rinsing, drying)?: A Little Help from another person to put on and taking off regular upper body clothing?: None Help from another person to put on and taking off regular lower body clothing?: A Lot 6 Click Score: 19   End of Session Equipment Utilized During Treatment: Gait belt;Oxygen (3L)  Activity Tolerance:  Patient tolerated treatment well Patient left: in chair;with call bell/phone within reach;with family/visitor present  OT Visit Diagnosis: Unsteadiness on feet (R26.81);Other (comment) (decreased activity tolerance)                Time: 6967-8938 OT Time Calculation (min): 23 min Charges:  OT General Charges $OT Visit: 1 Procedure OT Evaluation $OT Eval Moderate Complexity: 1 Procedure OT Treatments $Self Care/Home Management : 8-22 mins G-Codes:     Malka So 01/22/2017, 12:12 PM  [phone]

## 2017-01-22 NOTE — Evaluation (Signed)
Physical Therapy Evaluation Patient Details Name: Scott Rollins MRN: [id number] DOB: 08/13/1950 Today's Date: 01/22/2017   History of Present Illness  66yo male with hx severe COPD, CAD, HTN, OSA just d/c 7/5 after admission for AECOPD.  Returns 7/11 with c/o SOB, mild chest pain, difficulty urinating, low back pain.  In ER was found to be hypercarbic with pCO2 87 and started on bipap.  CT chest and abd did r/o acute issues.  Clinical Impression  Pt admitted with above diagnosis. Pt currently with functional limitations due to the deficits listed below (see PT Problem List).  Pt will benefit from skilled PT to increase their independence and safety with mobility to allow discharge to the venue listed below.  Pt ambulated 165' pushing o2 tank with de-st to 86% on 3L/min with improvement with break and pursed lip breathing.  Recommend pulmonary rehab.     Follow Up Recommendations Other (comment) (Pulmonary Rehab)    Equipment Recommendations  None recommended by PT    Recommendations for Other Services       Precautions / Restrictions Precautions Precautions: Other (comment) Precaution Comments: monitor o2 sats Restrictions Weight Bearing Restrictions: No      Mobility  Bed Mobility               General bed mobility comments: sitting EOB upon arrival  Transfers Overall transfer level: Needs assistance Equipment used: None Transfers: Sit to/from Stand Sit to Stand: Supervision         General transfer comment: S for safety  Ambulation/Gait Ambulation/Gait assistance: Min guard Ambulation Distance (Feet): 165 Feet Assistive device: None Gait Pattern/deviations: Step-through pattern;Decreased step length - right;Decreased step length - left Gait velocity: decreased   General Gait Details: Pt reports feeling slightly unsteady with gait, but no LOB while pushing o2 tank. o2 at 3 L/min and decreased to 86%, but once stopped and did pursed lip breathing and o2  quickly went back to 90s  Stairs            Wheelchair Mobility    Modified Rankin (Stroke Patients Only)       Balance Overall balance assessment: Needs assistance   Sitting balance-Leahy Scale: Good       Standing balance-Leahy Scale: Fair                               Pertinent Vitals/Pain Pain Assessment: No/denies pain    Home Living Family/patient expects to be discharged to:: Private residence Living Arrangements: Spouse/significant other;Children Available Help at Discharge: Family;Available 24 hours/day Type of Home: House Home Access: Stairs to enter Entrance Stairs-Rails: None Entrance Stairs-Number of Steps: 4 Home Layout: Multi-level Home Equipment: Walker - 2 wheels;Cane - single point Additional Comments: "To be honest, I'm not real mobile"    Prior Function Level of Independence: Independent         Comments: occasional use of cane and RW     Hand Dominance   Dominant Hand: Right    Extremity/Trunk Assessment   Upper Extremity Assessment Upper Extremity Assessment: Defer to OT evaluation    Lower Extremity Assessment Lower Extremity Assessment: Overall WFL for tasks assessed    Cervical / Trunk Assessment Cervical / Trunk Assessment: Normal  Communication   Communication: No difficulties  Cognition Arousal/Alertness: Awake/alert Behavior During Therapy: WFL for tasks assessed/performed Overall Cognitive Status: Within Functional Limits for tasks assessed  General Comments      Exercises     Assessment/Plan    PT Assessment Patient needs continued PT services  PT Problem List Decreased activity tolerance;Decreased balance;Decreased mobility;Cardiopulmonary status limiting activity       PT Treatment Interventions DME instruction;Gait training;Stair training;Functional mobility training;Therapeutic activities;Therapeutic exercise;Patient/family  education    PT Goals (Current goals can be found in the Care Plan section)  Acute Rehab PT Goals Patient Stated Goal: go home PT Goal Formulation: With patient Time For Goal Achievement: 02/05/17 Potential to Achieve Goals: Good    Frequency Min 3X/week   Barriers to discharge        Co-evaluation               AM-PAC PT "6 Clicks" Daily Activity  Outcome Measure Difficulty turning over in bed (including adjusting bedclothes, sheets and blankets)?: None Difficulty moving from lying on back to sitting on the side of the bed? : None Difficulty sitting down on and standing up from a chair with arms (e.g., wheelchair, bedside commode, etc,.)?: A Little Help needed moving to and from a bed to chair (including a wheelchair)?: A Little Help needed walking in hospital room?: A Little Help needed climbing 3-5 steps with a railing? : A Little 6 Click Score: 20    End of Session Equipment Utilized During Treatment: Gait belt;Oxygen Activity Tolerance: Patient tolerated treatment well;Other (comment) (de-sat with gait on o2) Patient left: in chair;with family/visitor present;Other (comment) (with dieticians) Nurse Communication: Mobility status PT Visit Diagnosis: Difficulty in walking, not elsewhere classified (R26.2)    Time: 6438-3818 PT Time Calculation (min) (ACUTE ONLY): 31 min   Charges:   PT Evaluation $PT Eval Low Complexity: 1 Procedure PT Treatments $Gait Training: 8-22 mins   PT G Codes:        [name], [place] Pager [phone] 01/22/2017   [name] 01/22/2017, 12:00 PM

## 2017-01-23 ENCOUNTER — Inpatient Hospital Stay (HOSPITAL_COMMUNITY): Payer: Medicare Other

## 2017-01-23 ENCOUNTER — Ambulatory Visit: Payer: Medicare Other | Admitting: Family Medicine

## 2017-01-23 ENCOUNTER — Telehealth: Payer: Self-pay | Admitting: *Deleted

## 2017-01-23 LAB — GLUCOSE, CAPILLARY
GLUCOSE-CAPILLARY: 129 mg/dL — AB (ref 65–99)
GLUCOSE-CAPILLARY: 124 mg/dL — AB (ref 65–99)
Glucose-Capillary: 115 mg/dL — ABNORMAL HIGH (ref 65–99)
Glucose-Capillary: 171 mg/dL — ABNORMAL HIGH (ref 65–99)

## 2017-01-23 MED ORDER — TECHNETIUM TC 99M MEDRONATE IV KIT
21.5000 | PACK | Freq: Once | INTRAVENOUS | Status: AC | PRN
  Administered 2017-01-23: 21.5 via INTRAVENOUS

## 2017-01-23 MED ORDER — OXYCODONE HCL 5 MG PO TABS
5.0000 mg | ORAL_TABLET | Freq: Once | ORAL | Status: AC
  Administered 2017-01-23: 5 mg via ORAL
  Filled 2017-01-23: qty 1

## 2017-01-23 MED ORDER — LEUPROLIDE ACETATE 7.5 MG IM KIT
7.5000 mg | PACK | Freq: Once | INTRAMUSCULAR | Status: AC
  Administered 2017-01-24: 7.5 mg via INTRAMUSCULAR
  Filled 2017-01-23: qty 7.5

## 2017-01-23 NOTE — Progress Notes (Signed)
[facility] PROGRESS NOTE  Scott Rollins IWP:[id number] DOB: 1951-03-21 DOA: 01/21/2017 PCP: [name], MD  Brief summary  66yo male with hx severe COPD, CAD, HTN, OSA just d/c 7/5 after admission for AECOPD. Returns 7/11 with c/o SOB, mild chest pain, difficulty urinating, low back pain. In ER was found to be hypercarbic with pCO2 87 and started on bipap. 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  Oncology consulted to start plan.  Patient high risk to bounce back.    Assessment/Plan:  Acute on chronic respiratory failure related to CO2 retention perhaps due to pain medications-hypoventilation on top of chronic COPD. CT chest showed Abnormal bronchial wall thickening is in the bilateral lower lobe airways. Findings probably represent an inflammatory process. -off bipap. improving with scheduled bronchodilators, steroids, levofloxacin. required BiPAP.  -seen by Dr. [name]: Acute hypercarbic respiratory failure-doubt that this was a true COPD exacerbation since he did not have any bronchospasm, acute respiratory acidosis was more likely related to narcotics that he took for back pain. Can continue Symbicort on discharge, he will need outpatient pulmonary follow-up and LAMA can be added on that visit based on lung function   A. fib. Rate controlled. Home medications include Eliquis chadvasc score 4. Evaluated by cardiology last hospitalization who opined patient poor candidate for anticoagulation for noncompliance and AAT.  Home meds include diltiazem -Continue home meds. monitor  Acute kidney injury. monitor with daily labs. hold lisinopril. Hold PRN lasix  Abnormal CT of the chest. scleroticlesions noted on bilateral ribs lumbar spine and pelvis. There is a sclerotic and destructive process in the left iliac bone worisome for tumor infiltration left iliac bone.  H/o prostate cancer- PSA 138 -evaluated by oncology and will get bone scan , followed by [name] . Appreciate the  input   Hypertension. on hydrochlorothiazide,bisoprolol, diltiazem, lisinopril, lasix -Hold lisinopril. continue other home meds  Hyperglycemia (h/o pre-diabetes) -SSI  Leukocytosis -suspect related to steroids   Code Status: full Family Communication: d/w patient, RN (indicate person spoken with, relationship, and if by phone, the number) Disposition Plan: home 2-3 days   Consultants:  Oncology   Procedures:  none  Antibiotics:  Levofloxacin 7/12<<< (indicate start date, and stop date if known)  HPI/Subjective: Alert, stable off bipap. Reports feeling much better   Objective: Vitals:   01/23/17 0500 01/23/17 0618  BP: (!) 175/97 (!) 146/78  Pulse: 78   Resp: 16   Temp: 97.9 F (36.6 C)     Intake/Output Summary (Last 24 hours) at 01/23/17 1018 Last data filed at 01/22/17 2219  Gross per 24 hour  Intake              750 ml  Output              525 ml  Net              225 ml   Filed Weights   01/21/17 1838 01/22/17 0500 01/23/17 0500  Weight: 124.5 kg (274 lb 6.4 oz) 122.1 kg (269 lb 1.6 oz) 123.1 kg (271 lb 4.8 oz)    Exam:   General:  No distress  Cardiovascular: s1,s2 rrr  Respiratory: few wheezing  Abdomen: soft, obese, nt  Musculoskeletal: no leg edema    Data Reviewed: Basic Metabolic Panel:  Recent Labs Lab 01/17/17 1353 01/21/17 1026 01/22/17 0234 01/22/17 1154  NA 137 138 139 136  K 4.3 4.3 5.2* 4.5  CL 96* 97* 99* 95*  CO2 33* 29 28 28  GLUCOSE 119* 112* 108* 308*  BUN 24* 22* 28* 34*  CREATININE 0.95 1.42* 1.42* 1.40*  CALCIUM 9.5 8.6* 8.3* 8.6*   Liver Function Tests: No results for input(s): AST, ALT, ALKPHOS, BILITOT, PROT, ALBUMIN in the last 168 hours. No results for input(s): LIPASE, AMYLASE in the last 168 hours. No results for input(s): AMMONIA in the last 168 hours. CBC:  Recent Labs Lab 01/21/17 1026 01/22/17 0234  WBC 22.7* 15.9*  NEUTROABS 18.6*  --   HGB 14.4 13.8  HCT 46.4 45.3  MCV 90.3 90.8   PLT 323 263   Cardiac Enzymes: No results for input(s): CKTOTAL, CKMB, CKMBINDEX, TROPONINI in the last 168 hours. BNP (last 3 results)  Recent Labs  11/04/16 2253 01/15/17 0630 01/21/17 1026  BNP 57.2 144.7* 55.1    ProBNP (last 3 results) No results for input(s): PROBNP in the last 8760 hours.  CBG:  Recent Labs Lab 01/22/17 1607 01/22/17 2157 01/23/17 0727  GLUCAP 137* 119* 129*    Recent Results (from the past 240 hour(s))  Culture, blood (Routine X 2) w Reflex to ID Panel     Status: None   Collection Time: 01/15/17  9:30 AM  Result Value Ref Range Status   Specimen Description BLOOD RIGHT FOREARM  Final   Special Requests   Final    BOTTLES DRAWN AEROBIC AND ANAEROBIC Blood Culture adequate volume   Culture NO GROWTH 5 DAYS  Final   Report Status 01/20/2017 FINAL  Final  Culture, blood (Routine X 2) w Reflex to ID Panel     Status: None   Collection Time: 01/15/17  9:40 AM  Result Value Ref Range Status   Specimen Description BLOOD RIGHT HAND  Final   Special Requests   Final    BOTTLES DRAWN AEROBIC AND ANAEROBIC Blood Culture adequate volume   Culture NO GROWTH 5 DAYS  Final   Report Status 01/20/2017 FINAL  Final  Culture, Urine     Status: Abnormal   Collection Time: 01/15/17 10:51 AM  Result Value Ref Range Status   Specimen Description URINE, RANDOM  Final   Special Requests NONE  Final   Culture (A)  Final    40,000 COLONIES/mL DIPHTHEROIDS(CORYNEBACTERIUM SPECIES) Standardized susceptibility testing for this organism is not available.    Report Status 01/16/2017 FINAL  Final  Culture, expectorated sputum-assessment     Status: None   Collection Time: 01/15/17  1:31 PM  Result Value Ref Range Status   Specimen Description EXPECTORATED SPUTUM  Final   Special Requests NONE  Final   Sputum evaluation THIS SPECIMEN IS ACCEPTABLE FOR SPUTUM CULTURE  Final   Report Status 01/15/2017 FINAL  Final  Culture, respiratory (NON-Expectorated)      Status: None   Collection Time: 01/15/17  1:31 PM  Result Value Ref Range Status   Specimen Description EXPECTORATED SPUTUM  Final   Special Requests NONE Reflexed from [id number]  Final   Gram Stain   Final    MODERATE WBC PRESENT, PREDOMINANTLY PMN RARE GRAM NEGATIVE RODS    Culture   Final    FEW HAEMOPHILUS INFLUENZAE BETA LACTAMASE POSITIVE    Report Status 01/17/2017 FINAL  Final  Respiratory Panel by PCR     Status: None   Collection Time: 01/15/17  4:38 PM  Result Value Ref Range Status   Adenovirus NOT DETECTED NOT DETECTED Final   Coronavirus 229E NOT DETECTED NOT DETECTED Final   Coronavirus HKU1 NOT DETECTED NOT DETECTED Final  Coronavirus NL63 NOT DETECTED NOT DETECTED Final   Coronavirus OC43 NOT DETECTED NOT DETECTED Final   Metapneumovirus NOT DETECTED NOT DETECTED Final   Rhinovirus / Enterovirus NOT DETECTED NOT DETECTED Final   Influenza A NOT DETECTED NOT DETECTED Final   Influenza B NOT DETECTED NOT DETECTED Final   Parainfluenza Virus 1 NOT DETECTED NOT DETECTED Final   Parainfluenza Virus 2 NOT DETECTED NOT DETECTED Final   Parainfluenza Virus 3 NOT DETECTED NOT DETECTED Final   Parainfluenza Virus 4 NOT DETECTED NOT DETECTED Final   Respiratory Syncytial Virus NOT DETECTED NOT DETECTED Final   Bordetella pertussis NOT DETECTED NOT DETECTED Final   Chlamydophila pneumoniae NOT DETECTED NOT DETECTED Final   Mycoplasma pneumoniae NOT DETECTED NOT DETECTED Final  MRSA PCR Screening     Status: None   Collection Time: 01/15/17  4:38 PM  Result Value Ref Range Status   MRSA by PCR NEGATIVE NEGATIVE Final    Comment:        The GeneXpert MRSA Assay (FDA approved for NASAL specimens only), is one component of a comprehensive MRSA colonization surveillance program. It is not intended to diagnose MRSA infection nor to guide or monitor treatment for MRSA infections.      Studies: Dg Chest Port 1 View  Result Date: 01/22/2017 CLINICAL DATA:  COPD  exacerbation. Chest pain and shortness of breath. EXAM: PORTABLE CHEST 1 VIEW COMPARISON:  CT chest 01/21/2017. Chest x-rays 01/21/2017 and earlier. FINDINGS: Cardiac silhouette upper normal in size for AP portable technique. Thoracic aorta mildly atherosclerotic. Prominent mediastinal fat as noted on CT accounts for the apparent superior mediastinal widening. Emphysematous changes throughout both lungs. Prominent bronchovascular markings and mild central peribronchial thickening, unchanged since 01/15/2017 but increased since 10/23/2015. No new pulmonary parenchymal abnormalities. No pleural effusions. No pneumothorax. IMPRESSION: Moderate changes of acute bronchitis and/or asthma without focal airspace pneumonia, superimposed upon COPD/emphysema. Electronically Signed   By: [name] M.D.   On: 01/22/2017 08:09   Dg Chest Portable 1 View  Result Date: 01/21/2017 CLINICAL DATA:  Chest pain, back pain, shortness of breath EXAM: PORTABLE CHEST 1 VIEW COMPARISON:  01/16/2017 FINDINGS: There is mild bilateral interstitial thickening. There is no focal parenchymal opacity. There is no pleural effusion or pneumothorax. There is stable cardiomegaly. The osseous structures are unremarkable. IMPRESSION: Cardiomegaly with mild pulmonary vascular congestion. Electronically Signed   By: [name]   On: 01/21/2017 10:51   Ct Angio Chest/abd/pel For Dissection W And/or W/wo  Result Date: 01/21/2017 CLINICAL DATA:  Chest pain radiating to the back. Short of breath. Symptoms since yesterday. EXAM: CT ANGIOGRAPHY CHEST, ABDOMEN AND PELVIS TECHNIQUE: Multidetector CT imaging through the chest, abdomen and pelvis was performed using the standard protocol during bolus administration of intravenous contrast. Multiplanar reconstructed images and MIPs were obtained and reviewed to evaluate the vascular anatomy. CONTRAST:  100 cc Isovue 370 COMPARISON:  07/17/2004, CT chest FINDINGS: CTA CHEST FINDINGS Cardiovascular:  There is no evidence of aortic dissection, intramural hematoma, or aortic aneurysm. Atherosclerotic calcifications of the aortic arch and great vessels are noted. Great vessels are grossly patent. There is smooth plaque in the proximal left subclavian artery. Vertebral arteries are grossly patent within the confines of the examination. There is no obvious pulmonary thromboembolism. Three vessel coronary artery calcifications. Mediastinum/Nodes: 10 mm short axis diameter subcarinal node on image 75 series 7. There is wall thickening of the lower lobe lobar bronchus bilaterally. There is also wall thickening of lower lobe segmental branches.  Small scattered mediastinal nodes. There is wall thickening of the distal esophagus which is moderate. Prominent mediastinal fat. No pericardial effusion. Lungs/Pleura: No pneumothorax. No pleural effusion. Reticulonodular opacities are present in the right upper lobe with a central and peripheral predominant pattern. These this also occurs in the right middle and lower lobes to a lesser degree. The largest nodule is 4 mm in the right upper lobe. See image 17 of series 6. There is a large bleb in the anterior right middle lobe. Musculoskeletal: There are innumerable sclerotic lesions involving bilateral ribs, multiple thoracic vertebral bodies, the sternum, in the manubrium these findings have developed since the prior study. There is no vertebral compression deformity. Review of the MIP images confirms the above findings. CTA ABDOMEN AND PELVIS FINDINGS VASCULAR Aorta: No evidence of dissection. Non aneurysmal and patent. Atherosclerotic calcifications throughout the abdominal aorta are noted. Celiac: Patent. SMA: Patent. Renals: Single renal arteries are patent. IMA: Patent. Inflow: There are atherosclerotic changes in the iliac vasculature. Common iliac arteries are patent bilaterally. There is irregular calcified and soft plaque in the proximal right external iliac artery  which does not appear flow limiting. Left external iliac artery is patent. Mild atherosclerotic calcifications throughout the bilateral internal iliac arteries. Review of the MIP images confirms the above findings. NON-VASCULAR Hepatobiliary: Liver at and gallbladder are within normal limits. Pancreas: Unremarkable Spleen: Unremarkable. Adrenals/Urinary Tract: There are chronic changes of the kidneys. Adrenal glands are within normal limits. There are simple cysts in the right kidneys. Stomach/Bowel: Stomach is within normal limits. Normal appendix. There is bubbly stool within the ascending colon. No obvious mass in the colon. No evidence of small-bowel obstruction. Sigmoid diverticulosis. There is no definitive wall thickening of the sigmoid colon. The colon is decompressed. There is haziness in the fat to the left of the sigmoid colon of unknown significance. There is also stranding anterior to the left iliopsoas muscle. The left iliopsoas muscle is thickened. See image 251. Lymphatic: There is abnormal left para-aortic and left iliac adenopathy. 15 mm left para- aortic node on image 203 of series 7. 15 mm left common iliac node on image 236. Several other smaller nodes are noted. Reproductive: Brachy therapy pellets in the prostate gland. Prostate is normal in size. Other: No free-fluid. Musculoskeletal: Innumerable sclerotic lesions are present in the lumbar spine and pelvis. There is a sclerotic and destructive process in the left iliac bone. This is associated with thickening of the musculature on either side of the left iliac crest. Findings are worrisome for tumor infiltration of the left iliac bone and tumor infiltration of the surrounding musculature. See image 250 of series 7 there is slight enlargement of the bone but this is not dramatic making Paget's disease less likely. There is no vertebral compression deformity. Review of the MIP images confirms the above findings. IMPRESSION: Thorax: No evidence  of thoracic aortic dissection. Abnormal bronchial wall thickening is in the bilateral lower lobe airways. Findings probably represent an inflammatory process. Bronchoscopy may be helpful Abnormal mediastinal adenopathy.  Malignancy is not excluded. Multiple sclerotic lesions in the thoracic spine, ribs, and sternum. Nonspecific wall thickening of the distal esophagus. There are reticulonodular opacities in the right lung likely representing an inflammatory process. The largest nodule is 4 mm. Malignancy is not excluded. No follow-up needed if patient is low-risk (and has no known or suspected primary neoplasm). Non-contrast chest CT can be considered in 12 months if patient is high-risk. This recommendation follows the consensus statement: Guidelines for  Management of Incidental Pulmonary Nodules Detected on CT Images: From the Fleischner Society 2017; Radiology 2017; 284:228-243. Abdomen pelvis: No evidence of aortic dissection. Abnormal adenopathy along the aortic chain and left iliac vessels worrisome for metastatic disease. Multiple sclerotic lesions in the lumbar spine and pelvis worrisome for metastatic disease. Carotid and destructive process involving the left iliac bone with extension into the surrounding musculature. This is worrisome for metastatic disease. Given the brachy therapy pellets in the prostate gland, metastatic prostate cancer is of primary concern. Correlation with PSA level is recommended. Electronically Signed   By: Marybelle Killings M.D.   On: 01/21/2017 13:17    Scheduled Meds: . albuterol  2.5 mg Nebulization TID  . allopurinol  300 mg Oral Daily  . apixaban  5 mg Oral BID  . bicalutamide  50 mg Oral Daily  . bisoprolol-hydrochlorothiazide  1 tablet Oral q morning - 10a  . budesonide (PULMICORT) nebulizer solution  0.5 mg Nebulization BID  . citalopram  20 mg Oral QHS  . diltiazem  30 mg Oral Q12H  . feeding supplement (ENSURE ENLIVE)  237 mL Oral BID BM  . gabapentin  300 mg  Oral QHS  . insulin aspart  0-15 Units Subcutaneous TID WC  . insulin aspart  0-5 Units Subcutaneous QHS  . leuprolide  7.5 mg Intramuscular Once  . methylPREDNISolone (SOLU-MEDROL) injection  60 mg Intravenous Daily  . sodium chloride flush  3 mL Intravenous Q12H  . umeclidinium-vilanterol  1 puff Inhalation Daily   Continuous Infusions: . levofloxacin (LEVAQUIN) IV Stopped (01/22/17 1902)    Principal Problem:   Acute on chronic respiratory failure (HCC) Active Problems:   OBESITY   Essential hypertension   Coronary artery disease due to lipid rich plaque   Prostate cancer (HCC)   Chronic gout   Chronic pain syndrome   COPD (chronic obstructive pulmonary disease) (HCC)   COPD with acute exacerbation (HCC)   OSA (obstructive sleep apnea)   Atrial fibrillation ([place])   Acute kidney injury ([place])   Abnormal CT of the chest   Prostate cancer metastatic to bone ([place])    Time spent: >35 minutes     Kinnie Feil  [facility] Pager [phone]. If 7PM-7AM, please contact night-coverage at [email or web address], password [facility] 01/23/2017, 10:18 AM  LOS: 2 days

## 2017-01-23 NOTE — Progress Notes (Signed)
Oncology brief follow up note  Pt started casodex today, lupron injection is scheduled for tomorrow, I stopped by and pt signed the chemo consent form, which was placed in his chart. Pt had bone scan done this afternoon, result was pending when I saw him. I have reviewed the report now.   I will set up his followup appointment in our cancer center.   Truitt Merle  01/23/2017

## 2017-01-23 NOTE — Telephone Encounter (Signed)
-----   Message from [name], [facility] sent at 01/20/2017 11:13 AM EDT ----- Regarding: Eliquis [name]! I did an extensive note after speaking with Mr. Neidig.  He was not taking quite a few meds after discharge: prednisone, furosemide, diltiazem.  He also was not able to get the nebulized medications filled that were prescribed at discharge...he is usuing Symbicort. (He received a preliminary supply on Saturday) Brigid Re except---they sent more paper work to sign and send back. If you guys decide to leave him on Symbicort, just sign what he brings you and fax it to me and I will send it to [place]  My fax number is [phone]  Last thing:  He is now on Eliquis which is also very expensive and will need paperwork as well. Since I won't get to see the patient before his appointment, would you mind printing the form for Eliquis, completing the physician section, get signatures from patient and provider and fax back to me as well?  So sorry to be a pain.  Link to the form:   [email or web address]   Blessings,   [name], PharmD, [place] Clinical Pharmacist [phone]

## 2017-01-23 NOTE — Progress Notes (Signed)
Spoke with [name] and notified her per [facility] RN IV team patient needs physical consent for Lupron.  MD stated she would be by today to consent patient.  Plan is for [place] RN IV team to give injection on Saturday, July 14.  Primary RN Meredith Mody and Pharmacy aware.  Sanda Linger

## 2017-01-23 NOTE — Plan of Care (Signed)
Problem: Safety: Goal: Ability to remain free from injury will improve Outcome: Progressing No falls during this admission. Call bell within reach. Bed in low and locked position. Patient alert and oriented. Clean and clear environment maintained. Nonskid footwear being utilized. Patient verbalized understanding of safety instruction. 2/4 siderails in place.  Problem: Coping: Goal: Ability to adjust to condition or change in health will improve Outcome: Progressing Patient understands disease process and is just trying to process this new diagnosis with other new diagnoses.

## 2017-01-23 NOTE — Progress Notes (Signed)
Occupational Therapy Treatment Patient Details Name: Scott Rollins MRN: [id number] DOB: 05-10-1951 Today's Date: 01/23/2017    History of present illness 66yo male with hx severe COPD, CAD, HTN, OSA just d/c 7/5 after admission for AECOPD.  Returns 7/11 with c/o SOB, mild chest pain, difficulty urinating, low back pain.  In ER was found to be hypercarbic with pCO2 87 and started on bipap.  CT chest and abd did r/o acute issues.   OT comments  Educated pt in use of adaptive equipment for LB bathing and dressing and in energy conservation. Pt feeling much better today. Overall functioning at a supervision level and has wife and daughter available to assist as needed.   Follow Up Recommendations  No OT follow up    Equipment Recommendations  None recommended by OT    Recommendations for Other Services      Precautions / Restrictions Precautions Precaution Comments: monitor o2 sats       Mobility Bed Mobility               General bed mobility comments: pt in chair upon arrival  Transfers Overall transfer level: Needs assistance Equipment used: None   Sit to Stand: Supervision              Balance     Sitting balance-Leahy Scale: Good       Standing balance-Leahy Scale: Fair                             ADL either performed or assessed with clinical judgement   ADL Overall ADL's : Needs assistance/impaired     Grooming: Supervision/safety;Standing;Oral care         Lower Body Bathing Details (indicate cue type and reason): educated in use of long handled bath sponge and reacher      Lower Body Dressing: Supervision/safety;Sit to/from stand Lower Body Dressing Details (indicate cue type and reason): instructed in use of reacher, sock aide and long handled bath sponge Toilet Transfer: Supervision/safety;Ambulation       Tub/ Shower Transfer: Supervision/safety;Ambulation;3 in 1 Tub/Shower Transfer Details (indicate cue type and  reason): pt reports showering with supervision with intermittent sitting on 3 in 1 last night Functional mobility during ADLs: Supervision/safety (primarily for lines) General ADL Comments: Educated at length in energy conservation and reinforce with handout     Vision       Perception     Praxis      Cognition Arousal/Alertness: Awake/alert Behavior During Therapy: WFL for tasks assessed/performed Overall Cognitive Status: Within Functional Limits for tasks assessed                                          Exercises     Shoulder Instructions       General Comments      Pertinent Vitals/ Pain       Pain Assessment: No/denies pain  Home Living                                          Prior Functioning/Environment              Frequency  Min 2X/week        Progress Toward Goals  OT Goals(current  goals can now be found in the care plan section)  Progress towards OT goals: Progressing toward goals  Acute Rehab OT Goals Patient Stated Goal: go home Time For Goal Achievement: 01/29/17 Potential to Achieve Goals: Good  Plan Discharge plan remains appropriate    Co-evaluation                 AM-PAC PT "6 Clicks" Daily Activity     Outcome Measure   Help from another person eating meals?: None Help from another person taking care of personal grooming?: A Little Help from another person toileting, which includes using toliet, bedpan, or urinal?: A Little Help from another person bathing (including washing, rinsing, drying)?: A Little Help from another person to put on and taking off regular upper body clothing?: None Help from another person to put on and taking off regular lower body clothing?: A Little 6 Click Score: 20    End of Session Equipment Utilized During Treatment: Gait belt  OT Visit Diagnosis: Unsteadiness on feet (R26.81);Other (comment) (decreased activity tolerance)   Activity Tolerance  Patient tolerated treatment well   Patient Left in chair;with call bell/phone within reach   Nurse Communication          Time: 2703-5009 OT Time Calculation (min): 22 min  Charges: OT General Charges $OT Visit: 1 Procedure OT Treatments $Self Care/Home Management : 8-22 mins     Malka So 01/23/2017, 9:09 AM  [phone]

## 2017-01-23 NOTE — Discharge Instructions (Signed)

## 2017-01-23 NOTE — Progress Notes (Signed)
Spoke to [employer] in pharmacy about this patient receiving the chemotherapeutic agent Lupron. I informed both Gwen and Lauren of our 24 hour prior notice for chemo that needs to be administered by IV team. Meredith Mody will follow up with the patient/family to see if they can administer the injection to themselves. Gwen will follow up with IV team after obtaining this information.  Catalina Pizza

## 2017-01-23 NOTE — Telephone Encounter (Signed)
Paperwork has been printed and on my desk waiting for pt to come in for office visit. Pt is currently in the hospital.

## 2017-01-24 LAB — BASIC METABOLIC PANEL
Anion gap: 7 (ref 5–15)
BUN: 40 mg/dL — ABNORMAL HIGH (ref 6–20)
CHLORIDE: 98 mmol/L — AB (ref 101–111)
CO2: 30 mmol/L (ref 22–32)
CREATININE: 1.16 mg/dL (ref 0.61–1.24)
Calcium: 8.7 mg/dL — ABNORMAL LOW (ref 8.9–10.3)
Glucose, Bld: 160 mg/dL — ABNORMAL HIGH (ref 65–99)
POTASSIUM: 4.9 mmol/L (ref 3.5–5.1)
SODIUM: 135 mmol/L (ref 135–145)

## 2017-01-24 LAB — GLUCOSE, CAPILLARY
GLUCOSE-CAPILLARY: 176 mg/dL — AB (ref 65–99)
Glucose-Capillary: 92 mg/dL (ref 65–99)
Glucose-Capillary: 158 mg/dL — ABNORMAL HIGH (ref 65–99)
Glucose-Capillary: 122 mg/dL — ABNORMAL HIGH (ref 65–99)

## 2017-01-24 MED ORDER — ACETAMINOPHEN 500 MG PO TABS
1000.0000 mg | ORAL_TABLET | Freq: Three times a day (TID) | ORAL | Status: DC
  Administered 2017-01-24 – 2017-01-25 (×4): 1000 mg via ORAL
  Filled 2017-01-24 (×4): qty 2

## 2017-01-24 MED ORDER — GABAPENTIN 300 MG PO CAPS
300.0000 mg | ORAL_CAPSULE | Freq: Two times a day (BID) | ORAL | Status: DC
  Administered 2017-01-24 – 2017-01-25 (×3): 300 mg via ORAL
  Filled 2017-01-24 (×3): qty 1

## 2017-01-24 MED ORDER — OXYCODONE HCL 5 MG PO TABS
10.0000 mg | ORAL_TABLET | Freq: Four times a day (QID) | ORAL | Status: DC | PRN
  Administered 2017-01-24 – 2017-01-25 (×2): 10 mg via ORAL
  Filled 2017-01-24 (×2): qty 2

## 2017-01-24 NOTE — Progress Notes (Signed)
[facility] PROGRESS NOTE  Scott Rollins ZDG:[id number] DOB: [birth year] DOA: 01/21/2017 PCP: [name], MD  Brief summary  66yo male with hx severe COPD, CAD, HTN, OSA just d/c 7/5 after admission for AECOPD. Returns 7/11 with c/o SOB, mild chest pain, difficulty urinating, low back pain. In ER was found to be hypercarbic with pCO2 87 and started on bipap. 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  Oncology consulted to start plan.  Patient high risk to bounce back.    Assessment/Plan:  Acute on chronic respiratory failure related to CO2 retention perhaps due to pain medications-hypoventilation on top of chronic COPD+bronchitis. CT chest showed Abnormal bronchial wall thickening is in the bilateral lower lobe airways. Findings probably represent an inflammatory process. -off bipap and improving with scheduled bronchodilators, steroids, levofloxacin. required BiPAP. Will transition to oral regimen in 24-48 hrs  -seen by Dr. [name]: Acute hypercarbic respiratory failure-doubt that this was a true COPD exacerbation since he did not have any bronchospasm, acute respiratory acidosis was more likely related to narcotics that he took for back pain. Can continue Symbicort on discharge, he will need outpatient pulmonary follow-up and LAMA can be added on that visit based on lung function   A. fib. Rate controlled. Home medications include Eliquis chadvasc score 4. Evaluated by cardiology last hospitalization who opined patient poor candidate for anticoagulation for noncompliance and AAT.  Home meds include diltiazem -Continue home meds. monitor  Acute kidney injury. monitor with daily labs. hold lisinopril. Hold PRN lasix  Abnormal CT of the chest. scleroticlesions noted on bilateral ribs lumbar spine and pelvis. There is a sclerotic and destructive process in the left iliac bone worisome for tumor infiltration left iliac bone.  H/o prostate cancer- PSA 138 -evaluated by oncology and  will get bone scan , followed by [name] . Appreciate the input   Hypertension. on hydrochlorothiazide,bisoprolol, diltiazem, lisinopril, lasix -Hold lisinopril. continue other home meds  Hyperglycemia (h/o pre-diabetes) -SSI  Leukocytosis. suspect related to steroids Acute on chronic cancer related pain. Suspect possible hypoventilation or mild opioid overdose at home, unintentional.  -still reports that paint is not well controlled. Will scheduled tylenol, cont prn oxycodone, increase gabapentin, scheduled for lupron injection    Code Status: full Family Communication: d/w patient, RN (indicate person spoken with, relationship, and if by phone, the number) Disposition Plan: home 24-48 hrs   Consultants:  Oncology   Procedures:  none  Antibiotics:  Levofloxacin 7/12<<< (indicate start date, and stop date if known)  HPI/Subjective: Alert, stable off bipap. Reports feeling much better   Objective: Vitals:   01/24/17 0001 01/24/17 0443  BP: 140/68 130/66  Pulse: 66 64  Resp: 16 18  Temp: (!) 97.1 F (36.2 C) 97.8 F (36.6 C)    Intake/Output Summary (Last 24 hours) at 01/24/17 0821 Last data filed at 01/23/17 1800  Gross per 24 hour  Intake              500 ml  Output                0 ml  Net              500 ml   Filed Weights   01/22/17 0500 01/23/17 0500 01/24/17 0443  Weight: 122.1 kg (269 lb 1.6 oz) 123.1 kg (271 lb 4.8 oz) 123.7 kg (272 lb 9.6 oz)    Exam:   General:  No distress  Cardiovascular: s1,s2 rrr  Respiratory: few wheezing  Abdomen: soft,  obese, nt  Musculoskeletal: no leg edema    Data Reviewed: Basic Metabolic Panel:  Recent Labs Lab 01/17/17 1353 01/21/17 1026 01/22/17 0234 01/22/17 1154 01/24/17 0244  NA 137 138 139 136 135  K 4.3 4.3 5.2* 4.5 4.9  CL 96* 97* 99* 95* 98*  CO2 33* 29 28 28 30   GLUCOSE 119* 112* 108* 308* 160*  BUN 24* 22* 28* 34* 40*  CREATININE 0.95 1.42* 1.42* 1.40* 1.16  CALCIUM 9.5 8.6* 8.3*  8.6* 8.7*   Liver Function Tests: No results for input(s): AST, ALT, ALKPHOS, BILITOT, PROT, ALBUMIN in the last 168 hours. No results for input(s): LIPASE, AMYLASE in the last 168 hours. No results for input(s): AMMONIA in the last 168 hours. CBC:  Recent Labs Lab 01/21/17 1026 01/22/17 0234  WBC 22.7* 15.9*  NEUTROABS 18.6*  --   HGB 14.4 13.8  HCT 46.4 45.3  MCV 90.3 90.8  PLT 323 263   Cardiac Enzymes: No results for input(s): CKTOTAL, CKMB, CKMBINDEX, TROPONINI in the last 168 hours. BNP (last 3 results)  Recent Labs  11/04/16 2253 01/15/17 0630 01/21/17 1026  BNP 57.2 144.7* 55.1    ProBNP (last 3 results) No results for input(s): PROBNP in the last 8760 hours.  CBG:  Recent Labs Lab 01/23/17 0727 01/23/17 1126 01/23/17 1646 01/23/17 2232 01/24/17 0735  GLUCAP 129* 115* 171* 124* 92    Recent Results (from the past 240 hour(s))  Culture, blood (Routine X 2) w Reflex to ID Panel     Status: None   Collection Time: 01/15/17  9:30 AM  Result Value Ref Range Status   Specimen Description BLOOD RIGHT FOREARM  Final   Special Requests   Final    BOTTLES DRAWN AEROBIC AND ANAEROBIC Blood Culture adequate volume   Culture NO GROWTH 5 DAYS  Final   Report Status 01/20/2017 FINAL  Final  Culture, blood (Routine X 2) w Reflex to ID Panel     Status: None   Collection Time: 01/15/17  9:40 AM  Result Value Ref Range Status   Specimen Description BLOOD RIGHT HAND  Final   Special Requests   Final    BOTTLES DRAWN AEROBIC AND ANAEROBIC Blood Culture adequate volume   Culture NO GROWTH 5 DAYS  Final   Report Status 01/20/2017 FINAL  Final  Culture, Urine     Status: Abnormal   Collection Time: 01/15/17 10:51 AM  Result Value Ref Range Status   Specimen Description URINE, RANDOM  Final   Special Requests NONE  Final   Culture (A)  Final    40,000 COLONIES/mL DIPHTHEROIDS(CORYNEBACTERIUM SPECIES) Standardized susceptibility testing for this organism is not  available.    Report Status 01/16/2017 FINAL  Final  Culture, expectorated sputum-assessment     Status: None   Collection Time: 01/15/17  1:31 PM  Result Value Ref Range Status   Specimen Description EXPECTORATED SPUTUM  Final   Special Requests NONE  Final   Sputum evaluation THIS SPECIMEN IS ACCEPTABLE FOR SPUTUM CULTURE  Final   Report Status 01/15/2017 FINAL  Final  Culture, respiratory (NON-Expectorated)     Status: None   Collection Time: 01/15/17  1:31 PM  Result Value Ref Range Status   Specimen Description EXPECTORATED SPUTUM  Final   Special Requests NONE Reflexed from [id number]  Final   Gram Stain   Final    MODERATE WBC PRESENT, PREDOMINANTLY PMN RARE GRAM NEGATIVE RODS    Culture   Final  FEW HAEMOPHILUS INFLUENZAE BETA LACTAMASE POSITIVE    Report Status 01/17/2017 FINAL  Final  Respiratory Panel by PCR     Status: None   Collection Time: 01/15/17  4:38 PM  Result Value Ref Range Status   Adenovirus NOT DETECTED NOT DETECTED Final   Coronavirus 229E NOT DETECTED NOT DETECTED Final   Coronavirus HKU1 NOT DETECTED NOT DETECTED Final   Coronavirus NL63 NOT DETECTED NOT DETECTED Final   Coronavirus OC43 NOT DETECTED NOT DETECTED Final   Metapneumovirus NOT DETECTED NOT DETECTED Final   Rhinovirus / Enterovirus NOT DETECTED NOT DETECTED Final   Influenza A NOT DETECTED NOT DETECTED Final   Influenza B NOT DETECTED NOT DETECTED Final   Parainfluenza Virus 1 NOT DETECTED NOT DETECTED Final   Parainfluenza Virus 2 NOT DETECTED NOT DETECTED Final   Parainfluenza Virus 3 NOT DETECTED NOT DETECTED Final   Parainfluenza Virus 4 NOT DETECTED NOT DETECTED Final   Respiratory Syncytial Virus NOT DETECTED NOT DETECTED Final   Bordetella pertussis NOT DETECTED NOT DETECTED Final   Chlamydophila pneumoniae NOT DETECTED NOT DETECTED Final   Mycoplasma pneumoniae NOT DETECTED NOT DETECTED Final  MRSA PCR Screening     Status: None   Collection Time: 01/15/17  4:38 PM  Result  Value Ref Range Status   MRSA by PCR NEGATIVE NEGATIVE Final    Comment:        The GeneXpert MRSA Assay (FDA approved for NASAL specimens only), is one component of a comprehensive MRSA colonization surveillance program. It is not intended to diagnose MRSA infection nor to guide or monitor treatment for MRSA infections.      Studies: Nm Bone Scan Whole Body  Result Date: 01/23/2017 CLINICAL DATA:  History of prostate cancer.  Abnormal CT EXAM: NUCLEAR MEDICINE WHOLE BODY BONE SCAN TECHNIQUE: Whole body anterior and posterior images were obtained approximately 3 hours after intravenous injection of radiopharmaceutical. RADIOPHARMACEUTICALS:  21.5 mCi Technetium-4m MDP IV COMPARISON:  Prior  bone scan 12/25/2015.  Prior CT 01/21/2017 FINDINGS: Worsening abnormal bony uptake throughout the skeleton, including the sternum, manubrium, numerous bilateral ribs, cervical, thoracic and lumbar spine, scapulae bilaterally, right humerus, and bony pelvis, particularly left pelvis. Probable small abnormal areas within the left femur. IMPRESSION: Significant worsening bony metastatic disease as above. Electronically Signed   By: Rolm Baptise M.D.   On: 01/23/2017 17:49    Scheduled Meds: . albuterol  2.5 mg Nebulization TID  . allopurinol  300 mg Oral Daily  . apixaban  5 mg Oral BID  . bicalutamide  50 mg Oral Daily  . bisoprolol-hydrochlorothiazide  1 tablet Oral q morning - 10a  . budesonide (PULMICORT) nebulizer solution  0.5 mg Nebulization BID  . citalopram  20 mg Oral QHS  . diltiazem  30 mg Oral Q12H  . feeding supplement (ENSURE ENLIVE)  237 mL Oral BID BM  . gabapentin  300 mg Oral QHS  . insulin aspart  0-15 Units Subcutaneous TID WC  . insulin aspart  0-5 Units Subcutaneous QHS  . leuprolide  7.5 mg Intramuscular Once  . methylPREDNISolone (SOLU-MEDROL) injection  60 mg Intravenous Daily  . sodium chloride flush  3 mL Intravenous Q12H  . umeclidinium-vilanterol  1 puff Inhalation  Daily   Continuous Infusions: . levofloxacin (LEVAQUIN) IV Stopped (01/23/17 1652)    Principal Problem:   Acute on chronic respiratory failure (HCC) Active Problems:   OBESITY   Essential hypertension   Coronary artery disease due to lipid rich plaque  Prostate cancer ([place])   Chronic gout   Chronic pain syndrome   COPD (chronic obstructive pulmonary disease) (HCC)   COPD with acute exacerbation (HCC)   OSA (obstructive sleep apnea)   Atrial fibrillation (HCC)   Acute kidney injury ([place])   Abnormal CT of the chest   Prostate cancer metastatic to bone [facility])    Time spent: >35 minutes     Kinnie Feil  [facility] Pager [phone]. If 7PM-7AM, please contact night-coverage at [email or web address], password [facility] 01/24/2017, 8:21 AM  LOS: 3 days

## 2017-01-24 NOTE — Plan of Care (Signed)
Problem: Education: Goal: Knowledge of  General Education information/materials will improve Outcome: Progressing POC reviewed with pt. And pain management discussed with pt.

## 2017-01-24 NOTE — Progress Notes (Signed)
Patient states that he will place himself on CPAP for the evening. RT will continue to monitor as needed.

## 2017-01-25 DIAGNOSIS — M899 Disorder of bone, unspecified: Secondary | ICD-10-CM

## 2017-01-25 DIAGNOSIS — R739 Hyperglycemia, unspecified: Secondary | ICD-10-CM

## 2017-01-25 LAB — GLUCOSE, CAPILLARY: Glucose-Capillary: 117 mg/dL — ABNORMAL HIGH (ref 65–99)

## 2017-01-25 MED ORDER — ACETAMINOPHEN 500 MG PO TABS: 1000.0000 mg | ORAL_TABLET | Freq: Three times a day (TID) | ORAL | 0 refills | Status: DC

## 2017-01-25 MED ORDER — OXYCODONE HCL 10 MG PO TABS: 10.0000 mg | ORAL_TABLET | Freq: Four times a day (QID) | ORAL | 0 refills | Status: DC | PRN

## 2017-01-25 MED ORDER — BICALUTAMIDE 50 MG PO TABS: 50.0000 mg | ORAL_TABLET | Freq: Every day | ORAL | 0 refills | Status: DC

## 2017-01-25 NOTE — Progress Notes (Signed)
Patient received all discharge information and education. He verbalized understanding. Respiratory medications were given to him as well, to take home.

## 2017-01-25 NOTE — Discharge Summary (Signed)
Physician Discharge Summary  Scott Rollins:[id number] DOB: 03/28/1951 DOA: 01/21/2017  PCP: Scott Sou, MD  Admit date: 01/21/2017 Discharge date: 01/25/2017   Recommendations for Outpatient Follow-Up:   Outpatient pain management for metastatic disease Close follow up with oncology-- Dr. Osker Rollins BMP 1 week  Discharge Diagnosis:   Principal Problem:   Acute on chronic respiratory failure (Scott Rollins) Active Problems:   OBESITY   Essential hypertension   Coronary artery disease due to lipid rich plaque   Prostate cancer (HCC)   Chronic gout   Chronic pain syndrome   COPD (chronic obstructive pulmonary disease) (HCC)   COPD with acute exacerbation (HCC)   OSA (obstructive sleep apnea)   Atrial fibrillation (Scott Rollins)   Acute kidney injury (Scott Rollins)   Abnormal CT of the chest   Prostate cancer metastatic to bone Scott Rollins)   Discharge disposition:  Home.  Discharge Condition: Improved.  Diet recommendation: Low sodium, heart healthy.  Carbohydrate-modified  Wound care: None.   History of Present Illness:   66yo male with hx severe COPD, CAD, HTN, OSA just d/c 7/5 after admission for AECOPD. Returns 7/11 with c/o SOB, mild chest pain, difficulty urinating, low back pain. In ER was found to be hypercarbic with pCO2 87 and started on bipap. 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 Oncology consulted to start plan. Patient high risk to bounce back   Hospital Course by Problem:   Metastatic prostate cancer to bone -seen by oncology -casodex -s/p lupron injection Outpatient follow up with oncology   Acute on chronic respiratory failure related to CO2 retention perhaps due to pain medications-hypoventilation on top of chronic COPD+bronchitis. CT chest showed Abnormal bronchial wall thickening is in the bilateral lower lobe airways. Findings probably represent an inflammatory process. -seen by Dr. Elsworth Rollins: Acute hypercarbic respiratory failure-doubt that this was a true COPD  exacerbation since he did not have any bronchospasm, acute respiratory acidosis was more likely related to narcotics that he took for back pain. Can continue Symbicort on discharge, he will need outpatient pulmonary follow-up and LAMA can be added on that visit based on lung function  A. fib. Rate controlled. Home medications include Eliquis chadvasc score 4. Evaluated by cardiology last hospitalization who opined patient poor candidate for anticoagulation for noncompliance and AAT.  Home meds include diltiazem -Continue home meds. monitor  Acute kidney injury -monitor with prn BMP  Abnormal CT of the chest. scleroticlesions noted on bilateral ribs lumbar spine and pelvis. There is a sclerotic and destructive process in the left iliac bone worisome for tumor infiltration left iliac bone.  H/o prostate cancer- PSA 138 -bone scan as below -seen by [name] Rollins-- see above for plan  Hypertension.on hydrochlorothiazide,bisoprolol, diltiazem, lisinopril, lasix  Hyperglycemia (h/o pre-diabetes) -carb mod diet  Acute on chronic cancer related pain. Suspect possible hypoventilation or mild opioid overdose at home, unintentional.  -still reports that paint is not well controlled. Will scheduled tylenol, cont prn oxycodone, scheduled for lupron injection     Medical Consultants:    pulm  oncology   Discharge Exam:   Vitals:   01/25/17 0500 01/25/17 0754  BP: 135/71 128/72  Pulse: 70 62  Resp:  18  Temp: 97.7 F (36.5 C) 98 F (36.7 C)   Vitals:   01/24/17 1647 01/24/17 2004 01/25/17 0500 01/25/17 0754  BP: (!) 153/90 (!) 164/81 135/71 128/72  Pulse:  64 70 62  Resp:    18  Temp: 98 F (36.7 C) 97.8 F (36.6 C) 97.7  F (36.5 C) 98 F (36.7 C)  TempSrc: Oral Oral Oral Oral  SpO2: 95% 92% 93% 97%  Weight:      Height:        Gen:  NAD in good spirits   The results of significant diagnostics from this hospitalization (including imaging, microbiology, ancillary and  laboratory) are listed below for reference.     Procedures and Diagnostic Studies:   Dg Chest Port 1 View  Result Date: 01/22/2017 CLINICAL DATA:  COPD exacerbation. Chest pain and shortness of breath. EXAM: PORTABLE CHEST 1 VIEW COMPARISON:  CT chest 01/21/2017. Chest x-rays 01/21/2017 and earlier. FINDINGS: Cardiac silhouette upper normal in size for AP portable technique. Thoracic aorta mildly atherosclerotic. Prominent mediastinal fat as noted on CT accounts for the apparent superior mediastinal widening. Emphysematous changes throughout both lungs. Prominent bronchovascular markings and mild central peribronchial thickening, unchanged since 01/15/2017 but increased since 10/23/2015. No new pulmonary parenchymal abnormalities. No pleural effusions. No pneumothorax. IMPRESSION: Moderate changes of acute bronchitis and/or asthma without focal airspace pneumonia, superimposed upon COPD/emphysema. Electronically Signed   By: Scott Rollins M.D.   On: 01/22/2017 08:09   Dg Chest Portable 1 View  Result Date: 01/21/2017 CLINICAL DATA:  Chest pain, back pain, shortness of breath EXAM: PORTABLE CHEST 1 VIEW COMPARISON:  01/16/2017 FINDINGS: There is mild bilateral interstitial thickening. There is no focal parenchymal opacity. There is no pleural effusion or pneumothorax. There is stable cardiomegaly. The osseous structures are unremarkable. IMPRESSION: Cardiomegaly with mild pulmonary vascular congestion. Electronically Signed   By: Scott Rollins   On: 01/21/2017 10:51   Ct Angio Chest/abd/pel For Dissection W And/or W/wo  Result Date: 01/21/2017 CLINICAL DATA:  Chest pain radiating to the back. Short of breath. Symptoms since yesterday. EXAM: CT ANGIOGRAPHY CHEST, ABDOMEN AND PELVIS TECHNIQUE: Multidetector CT imaging through the chest, abdomen and pelvis was performed using the standard protocol during bolus administration of intravenous contrast. Multiplanar reconstructed images and MIPs were  obtained and reviewed to evaluate the vascular anatomy. CONTRAST:  100 cc Isovue 370 COMPARISON:  07/17/2004, CT chest FINDINGS: CTA CHEST FINDINGS Cardiovascular: There is no evidence of aortic dissection, intramural hematoma, or aortic aneurysm. Atherosclerotic calcifications of the aortic arch and great vessels are noted. Great vessels are grossly patent. There is smooth plaque in the proximal left subclavian artery. Vertebral arteries are grossly patent within the confines of the examination. There is no obvious pulmonary thromboembolism. Three vessel coronary artery calcifications. Mediastinum/Nodes: 10 mm short axis diameter subcarinal node on image 75 series 7. There is wall thickening of the lower lobe lobar bronchus bilaterally. There is also wall thickening of lower lobe segmental branches. Small scattered mediastinal nodes. There is wall thickening of the distal esophagus which is moderate. Prominent mediastinal fat. No pericardial effusion. Lungs/Pleura: No pneumothorax. No pleural effusion. Reticulonodular opacities are present in the right upper lobe with a central and peripheral predominant pattern. These this also occurs in the right middle and lower lobes to a lesser degree. The largest nodule is 4 mm in the right upper lobe. See image 17 of series 6. There is a large bleb in the anterior right middle lobe. Musculoskeletal: There are innumerable sclerotic lesions involving bilateral ribs, multiple thoracic vertebral bodies, the sternum, in the manubrium these findings have developed since the prior study. There is no vertebral compression deformity. Review of the MIP images confirms the above findings. CTA ABDOMEN AND PELVIS FINDINGS VASCULAR Aorta: No evidence of dissection. Non aneurysmal and patent. Atherosclerotic calcifications  throughout the abdominal aorta are noted. Celiac: Patent. SMA: Patent. Renals: Single renal arteries are patent. IMA: Patent. Inflow: There are atherosclerotic changes  in the iliac vasculature. Common iliac arteries are patent bilaterally. There is irregular calcified and soft plaque in the proximal right external iliac artery which does not appear flow limiting. Left external iliac artery is patent. Mild atherosclerotic calcifications throughout the bilateral internal iliac arteries. Review of the MIP images confirms the above findings. NON-VASCULAR Hepatobiliary: Liver at and gallbladder are within normal limits. Pancreas: Unremarkable Spleen: Unremarkable. Adrenals/Urinary Tract: There are chronic changes of the kidneys. Adrenal glands are within normal limits. There are simple cysts in the right kidneys. Stomach/Bowel: Stomach is within normal limits. Normal appendix. There is bubbly stool within the ascending colon. No obvious mass in the colon. No evidence of small-bowel obstruction. Sigmoid diverticulosis. There is no definitive wall thickening of the sigmoid colon. The colon is decompressed. There is haziness in the fat to the left of the sigmoid colon of unknown significance. There is also stranding anterior to the left iliopsoas muscle. The left iliopsoas muscle is thickened. See image 251. Lymphatic: There is abnormal left para-aortic and left iliac adenopathy. 15 mm left para- aortic node on image 203 of series 7. 15 mm left common iliac node on image 236. Several other smaller nodes are noted. Reproductive: Brachy therapy pellets in the prostate gland. Prostate is normal in size. Other: No free-fluid. Musculoskeletal: Innumerable sclerotic lesions are present in the lumbar spine and pelvis. There is a sclerotic and destructive process in the left iliac bone. This is associated with thickening of the musculature on either side of the left iliac crest. Findings are worrisome for tumor infiltration of the left iliac bone and tumor infiltration of the surrounding musculature. See image 250 of series 7 there is slight enlargement of the bone but this is not dramatic making  Paget's disease less likely. There is no vertebral compression deformity. Review of the MIP images confirms the above findings. IMPRESSION: Thorax: No evidence of thoracic aortic dissection. Abnormal bronchial wall thickening is in the bilateral lower lobe airways. Findings probably represent an inflammatory process. Bronchoscopy may be helpful Abnormal mediastinal adenopathy.  Malignancy is not excluded. Multiple sclerotic lesions in the thoracic spine, ribs, and sternum. Nonspecific wall thickening of the distal esophagus. There are reticulonodular opacities in the right lung likely representing an inflammatory process. The largest nodule is 4 mm. Malignancy is not excluded. No follow-up needed if patient is low-risk (and has no known or suspected primary neoplasm). Non-contrast chest CT can be considered in 12 months if patient is high-risk. This recommendation follows the consensus statement: Guidelines for Management of Incidental Pulmonary Nodules Detected on CT Images: From the Fleischner Society 2017; Radiology 2017; 284:228-243. Abdomen pelvis: No evidence of aortic dissection. Abnormal adenopathy along the aortic chain and left iliac vessels worrisome for metastatic disease. Multiple sclerotic lesions in the lumbar spine and pelvis worrisome for metastatic disease. Carotid and destructive process involving the left iliac bone with extension into the surrounding musculature. This is worrisome for metastatic disease. Given the brachy therapy pellets in the prostate gland, metastatic prostate cancer is of primary concern. Correlation with PSA level is recommended. Electronically Signed   By: Marybelle Killings M.D.   On: 01/21/2017 13:17     Labs:   Basic Metabolic Panel:  Recent Labs Lab 01/21/17 1026 01/22/17 0234 01/22/17 1154 01/24/17 0244  NA 138 139 136 135  K 4.3 5.2* 4.5 4.9  CL  97* 99* 95* 98*  CO2 29 28 28 30   GLUCOSE 112* 108* 308* 160*  BUN 22* 28* 34* 40*  CREATININE 1.42* 1.42*  1.40* 1.16  CALCIUM 8.6* 8.3* 8.6* 8.7*   GFR Estimated Creatinine Clearance: 82.2 mL/min (by C-G formula based on SCr of 1.16 mg/dL). Liver Function Tests: No results for input(s): AST, ALT, ALKPHOS, BILITOT, PROT, ALBUMIN in the last 168 hours. No results for input(s): LIPASE, AMYLASE in the last 168 hours. No results for input(s): AMMONIA in the last 168 hours. Coagulation profile No results for input(s): INR, PROTIME in the last 168 hours.  CBC:  Recent Labs Lab 01/21/17 1026 01/22/17 0234  WBC 22.7* 15.9*  NEUTROABS 18.6*  --   HGB 14.4 13.8  HCT 46.4 45.3  MCV 90.3 90.8  PLT 323 263   Cardiac Enzymes: No results for input(s): CKTOTAL, CKMB, CKMBINDEX, TROPONINI in the last 168 hours. BNP: Invalid input(s): POCBNP CBG:  Recent Labs Lab 01/24/17 0735 01/24/17 1142 01/24/17 1644 01/24/17 2152 01/25/17 0733  GLUCAP 92 158* 122* 176* 117*   D-Dimer No results for input(s): DDIMER in the last 72 hours. Hgb A1c No results for input(s): HGBA1C in the last 72 hours. Lipid Profile No results for input(s): CHOL, HDL, LDLCALC, TRIG, CHOLHDL, LDLDIRECT in the last 72 hours. Thyroid function studies No results for input(s): TSH, T4TOTAL, T3FREE, THYROIDAB in the last 72 hours.  Invalid input(s): FREET3 Anemia work up No results for input(s): VITAMINB12, FOLATE, FERRITIN, TIBC, IRON, RETICCTPCT in the last 72 hours. Microbiology Recent Results (from the past 240 hour(s))  Culture, blood (Routine X 2) w Reflex to ID Panel     Status: None   Collection Time: 01/15/17  9:30 AM  Result Value Ref Range Status   Specimen Description BLOOD RIGHT FOREARM  Final   Special Requests   Final    BOTTLES DRAWN AEROBIC AND ANAEROBIC Blood Culture adequate volume   Culture NO GROWTH 5 DAYS  Final   Report Status 01/20/2017 FINAL  Final  Culture, blood (Routine X 2) w Reflex to ID Panel     Status: None   Collection Time: 01/15/17  9:40 AM  Result Value Ref Range Status    Specimen Description BLOOD RIGHT HAND  Final   Special Requests   Final    BOTTLES DRAWN AEROBIC AND ANAEROBIC Blood Culture adequate volume   Culture NO GROWTH 5 DAYS  Final   Report Status 01/20/2017 FINAL  Final  Culture, Urine     Status: Abnormal   Collection Time: 01/15/17 10:51 AM  Result Value Ref Range Status   Specimen Description URINE, RANDOM  Final   Special Requests NONE  Final   Culture (A)  Final    40,000 COLONIES/mL DIPHTHEROIDS(CORYNEBACTERIUM SPECIES) Standardized susceptibility testing for this organism is not available.    Report Status 01/16/2017 FINAL  Final  Culture, expectorated sputum-assessment     Status: None   Collection Time: 01/15/17  1:31 PM  Result Value Ref Range Status   Specimen Description EXPECTORATED SPUTUM  Final   Special Requests NONE  Final   Sputum evaluation THIS SPECIMEN IS ACCEPTABLE FOR SPUTUM CULTURE  Final   Report Status 01/15/2017 FINAL  Final  Culture, respiratory (NON-Expectorated)     Status: None   Collection Time: 01/15/17  1:31 PM  Result Value Ref Range Status   Specimen Description EXPECTORATED SPUTUM  Final   Special Requests NONE Reflexed from [id number]  Final   Gram Stain  Final    MODERATE WBC PRESENT, PREDOMINANTLY PMN RARE GRAM NEGATIVE RODS    Culture   Final    FEW HAEMOPHILUS INFLUENZAE BETA LACTAMASE POSITIVE    Report Status 01/17/2017 FINAL  Final  Respiratory Panel by PCR     Status: None   Collection Time: 01/15/17  4:38 PM  Result Value Ref Range Status   Adenovirus NOT DETECTED NOT DETECTED Final   Coronavirus 229E NOT DETECTED NOT DETECTED Final   Coronavirus HKU1 NOT DETECTED NOT DETECTED Final   Coronavirus NL63 NOT DETECTED NOT DETECTED Final   Coronavirus OC43 NOT DETECTED NOT DETECTED Final   Metapneumovirus NOT DETECTED NOT DETECTED Final   Rhinovirus / Enterovirus NOT DETECTED NOT DETECTED Final   Influenza A NOT DETECTED NOT DETECTED Final   Influenza B NOT DETECTED NOT DETECTED Final     Parainfluenza Virus 1 NOT DETECTED NOT DETECTED Final   Parainfluenza Virus 2 NOT DETECTED NOT DETECTED Final   Parainfluenza Virus 3 NOT DETECTED NOT DETECTED Final   Parainfluenza Virus 4 NOT DETECTED NOT DETECTED Final   Respiratory Syncytial Virus NOT DETECTED NOT DETECTED Final   Bordetella pertussis NOT DETECTED NOT DETECTED Final   Chlamydophila pneumoniae NOT DETECTED NOT DETECTED Final   Mycoplasma pneumoniae NOT DETECTED NOT DETECTED Final  MRSA PCR Screening     Status: None   Collection Time: 01/15/17  4:38 PM  Result Value Ref Range Status   MRSA by PCR NEGATIVE NEGATIVE Final    Comment:        The GeneXpert MRSA Assay (FDA approved for NASAL specimens only), is one component of a comprehensive MRSA colonization surveillance program. It is not intended to diagnose MRSA infection nor to guide or monitor treatment for MRSA infections.      Discharge Instructions:   Discharge Instructions    Diet - low sodium heart healthy    Complete by:  As directed    Diet Carb Modified    Complete by:  As directed    Discharge instructions    Complete by:  As directed    Wear CPAP at night or anytime napping during day   Increase activity slowly    Complete by:  As directed      Allergies as of 01/25/2017      Reactions   Amitriptyline Hcl Nausea Only   REACTION: nausea, elevated bp   Ezetimibe Other (See Comments)   REACTION: chest pain, fatigue      Medication List    STOP taking these medications   arformoterol 15 MCG/2ML Nebu Commonly known as:  BROVANA   doxycycline 50 MG capsule Commonly known as:  VIBRAMYCIN   lisinopril 20 MG tablet Commonly known as:  PRINIVIL,ZESTRIL     TAKE these medications   acetaminophen 500 MG tablet Commonly known as:  TYLENOL Take 2 tablets (1,000 mg total) by mouth 3 (three) times daily. What changed:  how much to take  when to take this  reasons to take this   albuterol (2.5 MG/3ML) 0.083% nebulizer  solution Commonly known as:  PROVENTIL USE ONE VIAL IN NEBULIZER EVERY 4 HOURS AS NEEDED FOR SHORTNESS OF BREATH OR WHEEZING   PROAIR HFA 108 (90 Base) MCG/ACT inhaler Generic drug:  albuterol Inhale 2 puffs into the lungs every 6 (six) hours as needed for wheezing or shortness of breath.   allopurinol 300 MG tablet Commonly known as:  ZYLOPRIM Take 1 tablet (300 mg total) by mouth daily.   apixaban 5  MG Tabs tablet Commonly known as:  ELIQUIS Take 1 tablet (5 mg total) by mouth 2 (two) times daily.   bicalutamide 50 MG tablet Commonly known as:  CASODEX Take 1 tablet (50 mg total) by mouth daily.   bisoprolol-hydrochlorothiazide 5-6.25 MG tablet Commonly known as:  ZIAC Take 1 tablet by mouth every morning.   budesonide 0.25 MG/2ML nebulizer solution Commonly known as:  PULMICORT Take 2 mLs (0.25 mg total) by nebulization 2 (two) times daily.   budesonide-formoterol 160-4.5 MCG/ACT inhaler Commonly known as:  SYMBICORT Inhale 2 puffs into the lungs 2 (two) times daily.   citalopram 40 MG tablet Commonly known as:  CELEXA Take 0.5 tablets (20 mg total) by mouth at bedtime.   cyclobenzaprine 10 MG tablet Commonly known as:  FLEXERIL Take 1 tablet (10 mg total) by mouth at bedtime.   dextromethorphan-guaiFENesin 30-600 MG 12hr tablet Commonly known as:  MUCINEX DM Take 1 tablet by mouth 2 (two) times daily as needed for cough.   diltiazem 30 MG tablet Commonly known as:  CARDIZEM Take 1 tablet (30 mg total) by mouth every 12 (twelve) hours.   feeding supplement (ENSURE ENLIVE) Liqd Take 237 mLs by mouth 2 (two) times daily between meals.   furosemide 40 MG tablet Commonly known as:  LASIX Take 2 tablets (80 mg total) by mouth daily as needed. What changed:  reasons to take this   gabapentin 300 MG capsule Commonly known as:  NEURONTIN Take 300 mg by mouth at bedtime.   ipratropium-albuterol 0.5-2.5 (3) MG/3ML Soln Commonly known as:  DUONEB Take 3 mLs by  nebulization every 6 (six) hours as needed. What changed:  reasons to take this   Oxycodone HCl 10 MG Tabs Take 1 tablet (10 mg total) by mouth every 6 (six) hours as needed for moderate pain, severe pain or breakthrough pain.   predniSONE 10 MG tablet Commonly known as:  DELTASONE Prednisone 60 mg daily for 3 days followed by  Prednisone 40 mg daily for 3 days followed by  Prednisone 20 mg daily for 3 days followed by Prednisone 10 mg daily for 3 days.   PROBIOTIC PO Take 1 capsule by mouth daily.   zolpidem 10 MG tablet Commonly known as:  AMBIEN Take 1 tablet (10 mg total) by mouth at bedtime as needed for sleep.      Follow-up Information    Tanda Rockers, MD Follow up on 01/30/2017.   Specialty:  Pulmonary Disease Why:  at 2:45 PM Contact information: [address] [address] [phone]        Scott Sou, MD Follow up in 1 week(s).   Specialty:  Family Medicine Contact information: [address] [place] [address] [phone]        Wyatt Portela, MD Follow up in 1 week(s).   Specialty:  Oncology Why:  call to be sure appointment has been made by [name] Rollins on [facility] information: [place] [address] [phone]            Time coordinating discharge: 35 min  Signed:  JESSICA Alison Stalling   [facility] 01/25/2017, 9:13 AM

## 2017-01-26 ENCOUNTER — Other Ambulatory Visit: Payer: Self-pay | Admitting: Pharmacist

## 2017-01-26 ENCOUNTER — Telehealth: Payer: Self-pay

## 2017-01-26 ENCOUNTER — Other Ambulatory Visit: Payer: Self-pay | Admitting: *Deleted

## 2017-01-26 NOTE — Patient Outreach (Signed)
[facility]) Care Management  01/26/2017  JESSEY STEHLIN 1951-01-08 [id number]   Covering for assigned care manager, J. Juleen China.  Member discharged from hospital on 7/15 after being admitted with COPD exacerbation.  Call placed to member for transition of care, no answer.  HIPAA compliant voice message left.  Will await call back, if no call back, will follow up tomorrow.    Update @ 1pm:  Call received back from member, identity confirmed.  He state he is doing well with the exception of uncontrolled pain due to a diagnosis of bone cancer.  He report that K. Luciana Axe, pharmacist, has been working with him on medication management for some time, state he spoke with her today to review all medications and plan.  Transition of care program and nurse case manager involvement explained, he report that his COPD is not an issue at this time, but he is open to have assistance with pain management.  He state he has been compliant with all medications and has follow up appointments scheduled for cardiology, pulmonology, and primary.  He state he was referred to an oncologist, waiting for a call to schedule appointment.  He is open to having contact from assigned care manager, but state he will be out of town until next Tuesday.  Will provide update and have her contact him next week.   Valente David, [place], MSN [place] [phone]

## 2017-01-26 NOTE — Telephone Encounter (Signed)
Transition Care Management Follow-up Telephone Call   Date discharged? 01/25/2017   How have you been since you were released from the hospital? "Feeling a lot better, but still tired"   Do you understand why you were in the hospital? yes, "pain, bone cancer"   Do you understand the discharge instructions? yes   Where were you discharged to? Home.    Items Reviewed:  Medications reviewed: yes  Allergies reviewed: yes  Dietary changes reviewed: yes, low carb, heart healthy reviewed.   Referrals reviewed: yes.   Functional Questionnaire:   Activities of Daily Living (ADLs):   He states they are independent in the following: ambulation, bathing and hygiene, feeding, continence, grooming, toileting and dressing States they require assistance with the following: None.    Any transportation issues/concerns?: no   Any patient concerns? no   Confirmed importance and date/time of follow-up visits scheduled yes  Provider Appointment booked with PCP 01/28/2017 @ 3pm.   Confirmed with patient if condition begins to worsen call PCP or go to the ER.  Patient was given the office number and encouraged to call back with question or concerns.  : yes

## 2017-01-26 NOTE — Telephone Encounter (Signed)
Noted: transitional care phone contact made on 01/26/17.  Signed:  Crissie Sickles, MD           01/26/2017

## 2017-01-26 NOTE — Patient Outreach (Signed)
[facility]) Care Management  [place]   01/26/2017  Scott Rollins 1951-01-21 [id number]  Subjective: Called patient to follow up on medication assistance and to follow up post discharge. HIPAA identifiers were obtained.  Patient is a 66 year old male with multiple medical conditions including but not limited to:  CAD, COPD,hypertension, OSA, Afib, chronic pain, and prostate cancer with possible bone metastases.  Patient has been hospitalized twice in the last month for Afib, COPD/respiratory failure.       Objective:   Encounter Medications: Outpatient Encounter Prescriptions as of 01/26/2017  Medication Sig Note  . acetaminophen (TYLENOL) 500 MG tablet Take 2 tablets (1,000 mg total) by mouth 3 (three) times daily.   Marland Kitchen albuterol (PROAIR HFA) 108 (90 Base) MCG/ACT inhaler Inhale 2 puffs into the lungs every 6 (six) hours as needed for wheezing or shortness of breath.   Marland Kitchen albuterol (PROVENTIL) (2.5 MG/3ML) 0.083% nebulizer solution USE ONE VIAL IN NEBULIZER EVERY 4 HOURS AS NEEDED FOR SHORTNESS OF BREATH OR WHEEZING   . allopurinol (ZYLOPRIM) 300 MG tablet Take 1 tablet (300 mg total) by mouth daily.   Marland Kitchen apixaban (ELIQUIS) 5 MG TABS tablet Take 1 tablet (5 mg total) by mouth 2 (two) times daily.   . bicalutamide (CASODEX) 50 MG tablet Take 1 tablet (50 mg total) by mouth daily.   . bisoprolol-hydrochlorothiazide (ZIAC) 5-6.25 MG tablet Take 1 tablet by mouth every morning.   . budesonide-formoterol (SYMBICORT) 160-4.5 MCG/ACT inhaler Inhale 2 puffs into the lungs 2 (two) times daily.   . citalopram (CELEXA) 40 MG tablet Take 0.5 tablets (20 mg total) by mouth at bedtime.   . cyclobenzaprine (FLEXERIL) 10 MG tablet Take 1 tablet (10 mg total) by mouth at bedtime.   Marland Kitchen dextromethorphan-guaiFENesin (MUCINEX DM) 30-600 MG 12hr tablet Take 1 tablet by mouth 2 (two) times daily as needed for cough.   . diltiazem (CARDIZEM) 30 MG tablet Take 1 tablet (30 mg total) by  mouth every 12 (twelve) hours.   . feeding supplement, ENSURE ENLIVE, (ENSURE ENLIVE) LIQD Take 237 mLs by mouth 2 (two) times daily between meals.   . furosemide (LASIX) 40 MG tablet Take 2 tablets (80 mg total) by mouth daily as needed. (Patient taking differently: Take 80 mg by mouth daily as needed for fluid. )   . gabapentin (NEURONTIN) 300 MG capsule Take 300 mg by mouth at bedtime.    Marland Kitchen ipratropium-albuterol (DUONEB) 0.5-2.5 (3) MG/3ML SOLN Take 3 mLs by nebulization every 6 (six) hours as needed. (Patient taking differently: Take 3 mLs by nebulization every 6 (six) hours as needed (wheezing). )   . oxyCODONE 10 MG TABS Take 1 tablet (10 mg total) by mouth every 6 (six) hours as needed for moderate pain, severe pain or breakthrough pain.   . predniSONE (DELTASONE) 10 MG tablet Prednisone 60 mg daily for 3 days followed by  Prednisone 40 mg daily for 3 days followed by  Prednisone 20 mg daily for 3 days followed by Prednisone 10 mg daily for 3 days.   . Probiotic Product (PROBIOTIC PO) Take 1 capsule by mouth daily.   Marland Kitchen zolpidem (AMBIEN) 10 MG tablet Take 1 tablet (10 mg total) by mouth at bedtime as needed for sleep.   . budesonide (PULMICORT) 0.25 MG/2ML nebulizer solution Take 2 mLs (0.25 mg total) by nebulization 2 (two) times daily. (Patient not taking: Reported on 01/26/2017) 01/20/2017: Not taking too expensive   No facility-administered encounter medications on  file as of 01/26/2017.     Functional Status: In your present state of health, do you have any difficulty performing the following activities: 01/22/2017 01/15/2017  Hearing? N N  Vision? N N  Difficulty concentrating or making decisions? N N  Walking or climbing stairs? Y Y  Dressing or bathing? N N  Doing errands, shopping? N N  Some recent data might be hidden    Fall/Depression Screening: Fall Risk  11/13/2015  Falls in the past year? No   PHQ 2/9 Scores 11/13/2015  PHQ - 2 Score 3  PHQ- 9 Score 6       Assessment: Patient was recently discharged from hospital and all medications have been reviewed.   Findings: New medications added at discharge:   Casodex (bicalutamide) 50mg  1 tablet twice daily Lupron injection (patient reported being given a Lupron injection prior to discharge)  Garlon Hatchet was discontinued and patient was instructed to continue Symbicort.  Patient assistance application for Symbicort was faxed back to [name].  Patient received 90 temporary supply.  Patient has an appointment with cardiology on 02/04/17 and with pulmonology on 02/05/17.Marland KitchenMarland KitchenApplications for Eliquis and Anoro (at patient's request) will be sent to the patient's home so he can take with him to his respective appointments.   Plan:  Follow up with the patient after his appointments  Mail applications to the patient's home.

## 2017-01-27 ENCOUNTER — Telehealth: Payer: Self-pay | Admitting: Oncology

## 2017-01-27 NOTE — Telephone Encounter (Signed)
Scheduled appt per sch message from Dr. Alen Blew - patient is aware of appt date and time.

## 2017-01-28 ENCOUNTER — Other Ambulatory Visit: Payer: Self-pay | Admitting: Family Medicine

## 2017-01-28 ENCOUNTER — Encounter: Payer: Self-pay | Admitting: Family Medicine

## 2017-01-28 ENCOUNTER — Ambulatory Visit (INDEPENDENT_AMBULATORY_CARE_PROVIDER_SITE_OTHER): Payer: Medicare Other | Admitting: Family Medicine

## 2017-01-28 VITALS — BP 140/84 | HR 64 | Temp 97.9°F | Resp 20 | Ht 70.0 in | Wt 277.8 lb

## 2017-01-28 DIAGNOSIS — I5189 Other ill-defined heart diseases: Secondary | ICD-10-CM

## 2017-01-28 DIAGNOSIS — J438 Other emphysema: Secondary | ICD-10-CM

## 2017-01-28 DIAGNOSIS — N179 Acute kidney failure, unspecified: Secondary | ICD-10-CM | POA: Diagnosis not present

## 2017-01-28 DIAGNOSIS — I10 Essential (primary) hypertension: Secondary | ICD-10-CM | POA: Diagnosis not present

## 2017-01-28 DIAGNOSIS — E8729 Other acidosis: Secondary | ICD-10-CM

## 2017-01-28 DIAGNOSIS — J9620 Acute and chronic respiratory failure, unspecified whether with hypoxia or hypercapnia: Secondary | ICD-10-CM

## 2017-01-28 DIAGNOSIS — I480 Paroxysmal atrial fibrillation: Secondary | ICD-10-CM | POA: Diagnosis not present

## 2017-01-28 DIAGNOSIS — C799 Secondary malignant neoplasm of unspecified site: Secondary | ICD-10-CM

## 2017-01-28 DIAGNOSIS — C61 Malignant neoplasm of prostate: Secondary | ICD-10-CM | POA: Diagnosis not present

## 2017-01-28 DIAGNOSIS — I519 Heart disease, unspecified: Secondary | ICD-10-CM

## 2017-01-28 DIAGNOSIS — G894 Chronic pain syndrome: Secondary | ICD-10-CM

## 2017-01-28 DIAGNOSIS — E872 Acidosis: Secondary | ICD-10-CM

## 2017-01-28 MED ORDER — BUDESONIDE-FORMOTEROL FUMARATE 160-4.5 MCG/ACT IN AERO: 2.0000 | INHALATION_SPRAY | Freq: Two times a day (BID) | RESPIRATORY_TRACT | 3 refills | Status: AC

## 2017-01-28 NOTE — Progress Notes (Signed)
01/28/2017  CC:  Chief Complaint  Patient presents with  . Hospitalization Follow-up    TCM    Patient is a 66 y.o. Caucasian male who presents for  hospital follow up, specifically [facility] face-to-face visit. Dates hospitalized: 7/11-7/15, 2018. Days since d/c from hospital: 3 days Patient was discharged from hospital to home. Reason for admission to hospital: acute on chronic respiratory failure. Date of interactive (phone) contact with patient and/or caregiver: 01/26/17.  I have reviewed patient's discharge summary plus pertinent specific notes, labs, and imaging from the hospitalization.   Hypercarbic resp failure.  Suspected that this was due to resp suppression from narcotic pain med use superimposed on his COPD. Chest/bd/pelv CT imaging showed some changes possibly due to inflammatory bronchitis as well as lesions suspicious for mediastinal adenopathy, abd and pelvic lymphadenopathy, rib metastases.   and metastases in lumbar spine and pelvis.   Bone scan is ordered/pending. He was seen in consultation by oncology in hosp--[name].  He was given a lupron injection and rx'd casodex.  He is suffering from lots of pain: chronic osteoarthritis in low back and kness, as well as musculoskeletal pain from sclerotic bone lesions. Due to his use of marijuana, +UDS for this substance, I had declined to rx him any further narcotic pain medication--I did write him a weening oxycodone rx (40 day ween) on 12/23/16. He was d/c'd home from this hospitalization with oxycodone 10mg , 1 q6h prn, #30. The assumption at this time is that Dr. Alen Blew with be managing his pain med, but pt has not yet had his first visit with him yet, so we'll see what comes of this.  Has f/u appts later this month with pulmonology, cardiology, and oncology (Dr. Alen Blew).  Feeling much better.  He admits he had been using albuterol only, very frequently--could not afford his symbicort. Has paperwork  today for financial assistance with eliquis and symbicort.  Says he is breathing "better than I have in I don't know when". He is taking tylenol for pain 1000 mg tid, also oxycodone 10mg  tid scheduled.  Medication reconciliation was done today and patient is taking meds as recommended by discharging hospitalist/specialist.  This includes oxygen at home prn ambulation.  Using incentive spirometer.   PMH:  Past Medical History:  Diagnosis Date  . Anginal pain ([place])   . Anxiety   . Arthritis    "hips, knees, thoracic back" (01/22/2017)  . CAP (community acquired pneumonia) 11/2016  . 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 COPD (chronic obstructive pulmonary disease) ([place])    "pearl study" pt thinks he is on Symbicort; pt noncompliant with COPD controller meds on/off due to financial reasons.  . Coronary atherosclerosis of unspecified type of vessel, native or graft 2005   MI, s/p PCI with stent (pt reports 20+ interventions in the past, most recent cat 6/08 showed patent stents).  Normal LV function.  Nuclear stress test NEG 8/09.  . Depression    ?bipolar dx by psychiatrist?  . Diverticulosis 2006  . ED (erectile dysfunction)   . Gout   . Gouty arthritis    Dr. Amil Amen  . Heart murmur   . Hematochezia 09/2012   Hyperplastic polyp, diverticulosis, and internal hemorrhoids found on colonoscopy 10/2012  . History of hiatal hernia   . Hyperlipidemia    Hx of multiple statin intolerance  . Hypertension   . 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 Migraine    "none in the 2000s" (01/15/2017)  . Mitral valve prolapse ~ 1981  . Myocardial infarction [facility]) ~ 1993- 2005 X 3-4   (01/15/2017)  . Neuropathic pain of both feet 2015/2016   Vit B12 and A1c normal 10/2014  . Obesities, morbid ([place])   . On home oxygen therapy    "2L prn" (01/15/2017)  . OSA  on CPAP    w/oxygen (01/15/2017)  . Osteoarthrosis, unspecified whether generalized or localized, unspecified site    DDD  . PAF (paroxysmal atrial fibrillation) ([place]) 12/2016   Started in the context of resp illness/lots of bronchodilators.  Started eliquis and cardizem during hospitalization 01/2017 (Dr. Rayann Heman).  . Pre-diabetes 2016/17  . Prostate cancer ([place]) 09/2010   Localized, high risk disease (Dr. Kimbrough/Grapey):  s/p 5 wks ext bm rad + seed boost, as well as hormone blockade x 1 yr.  Biochemical recurrence 11/2015--urologist did bone scan and spots on T spine and L iliac region came up, pt needs plain films of pelvis/T spine to see if DJD correlate present.  He'll eventually need to resume androgen depriv therapy.  . Radiation    Pelvic--for prostate ca: 25 treatments/01/2011  . Seizure disorder ([place]) 08-15-2011   Deja vu sensations: no w/u.  These stopped when neurontin 300mg  tid was started for a different reason.  . Status post chemotherapy    10/2010 thru 06/2011    PSH:  Past Surgical History:  Procedure Laterality Date  . CARDIAC CATHETERIZATION  23 caths  . COLONOSCOPY WITH PROPOFOL N/A 04/05/2013   Dr. Fuller Plan.  Polypectomy (hyperplastic--recall 10 yrs).  Moderate diverticulosis, +internal hemorrhoids.  No radiation proctitis.  . CORONARY ANGIOPLASTY    . CORONARY ANGIOPLASTY WITH STENT PLACEMENT     "5-6 stents" (01/15/2017)  . HERNIA REPAIR    . HOT HEMOSTASIS N/A 04/05/2013   Procedure: HOT HEMOSTASIS (ARGON PLASMA COAGULATION/BICAP);  Surgeon: Ladene Artist, MD;  Location: [facility];  Service: Endoscopy;  Laterality: N/A;  . INSERTION PROSTATE RADIATION SEED  02/24/2011  . TRANSTHORACIC ECHOCARDIOGRAM  12/2016   EF 55-60%, moderate LVH, grd I DD, mild LA dilation.  Marland Kitchen UMBILICAL HERNIA REPAIR      MEDS:  Outpatient Medications Prior to Visit  Medication Sig Dispense Refill  . acetaminophen (TYLENOL) 500 MG tablet Take 2 tablets (1,000 mg total) by mouth 3 (three)  times daily. 30 tablet 0  . albuterol (PROAIR HFA) 108 (90 Base) MCG/ACT inhaler Inhale 2 puffs into the lungs every 6 (six) hours as needed for wheezing or shortness of breath.    Marland Kitchen albuterol (PROVENTIL) (2.5 MG/3ML) 0.083% nebulizer solution USE ONE VIAL IN NEBULIZER EVERY 4 HOURS AS NEEDED FOR SHORTNESS OF BREATH OR WHEEZING    . allopurinol (ZYLOPRIM) 300 MG tablet Take 1 tablet (300 mg total) by mouth daily. 90 tablet 3  . apixaban (ELIQUIS) 5 MG TABS tablet Take 1 tablet (5 mg total) by mouth 2 (two) times daily. 60 tablet 0  . bicalutamide (CASODEX) 50 MG tablet Take 1 tablet (50 mg total) by mouth daily. 30 tablet 0  . bisoprolol-hydrochlorothiazide (ZIAC) 5-6.25 MG tablet Take 1 tablet by mouth every morning. 90 tablet 3  . budesonide-formoterol (SYMBICORT) 160-4.5 MCG/ACT inhaler Inhale 2 puffs into the lungs 2 (two) times daily.    . citalopram (CELEXA) 40 MG tablet Take 0.5 tablets (20 mg total) by mouth at bedtime.  90 tablet 3  . cyclobenzaprine (FLEXERIL) 10 MG tablet Take 1 tablet (10 mg total) by mouth at bedtime. 30 tablet 5  . dextromethorphan-guaiFENesin (MUCINEX DM) 30-600 MG 12hr tablet Take 1 tablet by mouth 2 (two) times daily as needed for cough.    . diltiazem (CARDIZEM) 30 MG tablet Take 1 tablet (30 mg total) by mouth every 12 (twelve) hours. 60 tablet 0  . feeding supplement, ENSURE ENLIVE, (ENSURE ENLIVE) LIQD Take 237 mLs by mouth 2 (two) times daily between meals. 237 mL 12  . furosemide (LASIX) 40 MG tablet Take 2 tablets (80 mg total) by mouth daily as needed. (Patient taking differently: Take 80 mg by mouth daily as needed for fluid. ) 180 tablet 1  . gabapentin (NEURONTIN) 300 MG capsule Take 300 mg by mouth 2 (two) times daily.     Marland Kitchen oxyCODONE 10 MG TABS Take 1 tablet (10 mg total) by mouth every 6 (six) hours as needed for moderate pain, severe pain or breakthrough pain. 30 tablet 0  . predniSONE (DELTASONE) 10 MG tablet Prednisone 60 mg daily for 3 days followed  by  Prednisone 40 mg daily for 3 days followed by  Prednisone 20 mg daily for 3 days followed by Prednisone 10 mg daily for 3 days. 39 tablet 0  . Probiotic Product (PROBIOTIC PO) Take 1 capsule by mouth daily.    Marland Kitchen zolpidem (AMBIEN) 10 MG tablet Take 1 tablet (10 mg total) by mouth at bedtime as needed for sleep. 90 tablet 1  . budesonide (PULMICORT) 0.25 MG/2ML nebulizer solution Take 2 mLs (0.25 mg total) by nebulization 2 (two) times daily. (Patient not taking: Reported on 01/26/2017) 60 mL 2  . ipratropium-albuterol (DUONEB) 0.5-2.5 (3) MG/3ML SOLN Take 3 mLs by nebulization every 6 (six) hours as needed. (Patient not taking: Reported on 01/28/2017) 360 mL 3   No facility-administered medications prior to visit.    EXAM:BP (!) 183/83 (BP Location: Left Arm, Patient Position: Sitting, Cuff Size: Normal)   Pulse 64   Temp 97.9 F (36.6 C) (Oral)   Resp 20   Ht 5\' 10"  (1.778 m)   Wt 277 lb 12 oz (126 kg)   SpO2 95%   BMI 39.85 kg/m  Gen: Alert, well appearing.  Patient is oriented to person, place, time, and situation. AFFECT: pleasant, lucid thought and speech. CV: RRR, no m/r/g.   LUNGS: CTA bilat except for some trace end exp wheezing, nonlabored resps.  Exp phase mildly prolonged. EXT: no cyanosis or edema.    Pertinent labs/imaging   Chemistry      Component Value Date/Time   NA 135 01/24/2017 0244   K 4.9 01/24/2017 0244   CL 98 (L) 01/24/2017 0244   CO2 30 01/24/2017 0244   BUN 40 (H) 01/24/2017 0244   CREATININE 1.16 01/24/2017 0244   CREATININE 0.86 07/22/2012 1736      Component Value Date/Time   CALCIUM 8.7 (L) 01/24/2017 0244   ALKPHOS 180 (H) 01/15/2017 0630   AST 20 01/15/2017 0630   ALT 19 01/15/2017 0630   BILITOT 2.7 (H) 01/15/2017 0630     Lab Results  Component Value Date   WBC 15.9 (H) 01/22/2017   HGB 13.8 01/22/2017   HCT 45.3 01/22/2017   MCV 90.8 01/22/2017   PLT 263 01/22/2017   Lab Results  Component Value Date   PSA 6.15 (H)  11/15/2015   PSA 1.50 04/13/2014   PSA 4.91 (H) 08/23/2010   ASSESSMENT/PLAN:  1) Acute on chronic respiratory failure: acute aspect is resolved. He will continue with oxygen 2L at home prn activity. Continue symbicort 160/4.5, 2 p bid--paperwork for prescription savings program filled out today. Avoid over-reliance on rescue albuterol. Avoid over-use of narcotic pain meds. Finish prednisone taper.  2) HTN: bp control marginal but will hold off on any med change at this time. He likely has some chronic diastolic HF, has lasix to use prn edema/fluid wt gain.  3) A-fib: Regular rhythm today.  His PAF is possibly attributable to respiratory illness + overuse of short acting bronchodilator. For now, continue eliquis and cardizem.  Pt to f/u with his electrophysiologist soon.  4) Metastatic prostate cancer: pain control started by hospitalist (tylenol 1000 mg tid + oxycodone 10mg  tid scheduled) and responsibility for ongoing pain mgmt likely to be assumed by oncologist, Dr. Alen Blew.  5) Acute kidney injury/Cr bump in hospital recently: it had improved prior to d/c home. Recheck BMET today.  Medical decision making of high complexity was utilized today.  An After Visit Summary was printed and given to the patient.  FOLLOW UP:  2-3 mo  Signed:  Crissie Sickles, MD           01/28/2017

## 2017-01-29 ENCOUNTER — Encounter: Payer: Self-pay | Admitting: Family Medicine

## 2017-01-29 ENCOUNTER — Other Ambulatory Visit: Payer: Self-pay | Admitting: *Deleted

## 2017-01-29 ENCOUNTER — Other Ambulatory Visit: Payer: Self-pay | Admitting: Family Medicine

## 2017-01-29 DIAGNOSIS — E875 Hyperkalemia: Secondary | ICD-10-CM

## 2017-01-29 DIAGNOSIS — I10 Essential (primary) hypertension: Secondary | ICD-10-CM

## 2017-01-29 HISTORY — DX: Hyperkalemia: E87.5

## 2017-01-29 LAB — BASIC METABOLIC PANEL
BUN: 21 mg/dL (ref 6–23)
CALCIUM: 9.2 mg/dL (ref 8.4–10.5)
CO2: 37 mEq/L — ABNORMAL HIGH (ref 19–32)
Chloride: 98 mEq/L (ref 96–112)
Creatinine, Ser: 0.87 mg/dL (ref 0.40–1.50)
GFR: 92.93 mL/min (ref >60.00)
GLUCOSE: 103 mg/dL — AB (ref 70–99)
Potassium: 5.9 mEq/L — ABNORMAL HIGH (ref 3.5–5.1)
Sodium: 139 mEq/L (ref 135–145)

## 2017-01-29 MED ORDER — CITALOPRAM HYDROBROMIDE 40 MG PO TABS: 20.0000 mg | ORAL_TABLET | Freq: Every day | ORAL | 3 refills | Status: DC

## 2017-01-29 MED ORDER — ZOLPIDEM TARTRATE 10 MG PO TABS: 10.0000 mg | ORAL_TABLET | Freq: Every evening | ORAL | 5 refills | Status: DC | PRN

## 2017-01-29 MED ORDER — CYCLOBENZAPRINE HCL 10 MG PO TABS: 10.0000 mg | ORAL_TABLET | Freq: Every day | ORAL | 1 refills | Status: DC

## 2017-01-29 MED ORDER — SODIUM POLYSTYRENE SULFONATE 15 GM/60ML PO SUSP: 30.0000 g | Freq: Once | ORAL | 0 refills | Status: AC

## 2017-01-29 NOTE — Telephone Encounter (Signed)
Yesterday evening after pts visit pt requested that we send some of his prescriptions to [facility] at [facility].  He wanted Rx sent for allopurinol, bisoprolol/hctz, citalopram, Flexeril and Ambien.   I called [facility] at [facility] and had them transfer over the allopurinol and bisoprolol/hctz.  I called [facility] in [place] and had them cancel Rx's for Flexeril and Ambien.   Pt needs 90 day Rx's for the citalopram and flexeril and a 30 day Rx for the Ambien sent to [facility].  Please advise. Thanks.

## 2017-01-29 NOTE — Telephone Encounter (Signed)
Rx for Ambien has been faxed.

## 2017-01-30 ENCOUNTER — Institutional Professional Consult (permissible substitution): Payer: Medicare Other | Admitting: Internal Medicine

## 2017-01-30 ENCOUNTER — Other Ambulatory Visit: Payer: Self-pay | Admitting: Family Medicine

## 2017-01-30 MED ORDER — SODIUM POLYSTYRENE SULFONATE 15 GM/60ML PO SUSP: 30.0000 g | Freq: Once | ORAL | 0 refills | Status: AC

## 2017-01-30 NOTE — Telephone Encounter (Signed)
Forms have been completed by Dr. Anitra Lauth, copies have been made for pts chart. Forms have been put up front to pt to p/u still need pt to fill out his part.   Left message on both home and cell stating these forms were ready for p/u but needed pt to complete his portion.

## 2017-02-02 ENCOUNTER — Other Ambulatory Visit: Payer: Medicare Other

## 2017-02-03 ENCOUNTER — Other Ambulatory Visit: Payer: Medicare Other

## 2017-02-03 DIAGNOSIS — Z79899 Other long term (current) drug therapy: Secondary | ICD-10-CM | POA: Diagnosis not present

## 2017-02-03 LAB — BASIC METABOLIC PANEL
BUN: 30 mg/dL — AB (ref 7–25)
CHLORIDE: 100 mmol/L (ref 98–110)
CO2: 24 mmol/L (ref 20–31)
CREATININE: 0.91 mg/dL (ref 0.70–1.25)
Calcium: 8.6 mg/dL (ref 8.6–10.3)
GLUCOSE: 95 mg/dL (ref 65–99)
POTASSIUM: 4.2 mmol/L (ref 3.5–5.3)
Sodium: 138 mmol/L (ref 135–146)

## 2017-02-03 NOTE — Progress Notes (Deleted)
Cardiology Office Note Date:  02/03/2017  Patient ID:  [name], [name] [birth year], MRN [id number] PCP:  [name], MD  Cardiologist:  Dr. Harrington Challenger (lost to f/u since 2012, records reports he has seen Dr. Tamala Julian with [facility] as well) Electrophysiologist: Dr. Rayann Heman (lost   ***refresh   Chief Complaint: planned follow up?  History of Present Illness: Scott Rollins is a 66 y.o. male with history of CAD and is s/p prior PCIs.  He also has severe COPD on home O2 and OSA, only recently becoming compliant with CPAP, he continues to smoke cigarettes as well as marijuana.  He was lost to f/u with cardiology since 2012, being re-referred to Dr. Rayann Heman in June for evaluation and management options of his A arrhythmias, Dr. Rayann Heman described chronic baseline SOB, chronic R hip pain, otherwise no symptoms reported.  He reported:  I have reviewed ekgs from [facility] which also reveal primarily multifocal atrial tachycardia as well as nonsustained atrial arrhythmias.  I do not see clear cut sustained afib.  Certainly, he has multiple risk factors for atrial arrhythmias including obesity, lung disease, OSA, uncontrolled hypertension, steroid use, inactivity, etc.  We had a long discussion about the importance of lifestyle modification today including weight loss, medical compliance, CPAP compliance, and avoidance of marijuana/ tobacco.  I have advised that he appears to be "on a runaway train" from a CV standpoint.  He agrees and says that he will be more diligent.   Given noncompliance with follow-up, I would not advise anticoagulation unless we are very clear that he has sustained afib.  He is a poor candidate for antiarrhythmic medicines and also for ablation."  Subsequent to that visit he has been in the hospital twice, discharged 01/17/17 after COPD exacerbation and AFib (Consult by Dr. Rayann Heman noted MAT, though discussed multiple the patient as having atrial arrhythmias), upon  discharge was started on Eliquis, cardiology was on board, reports clear AF, discharge summary mentions discussion regarding compliance.  He was readmitted 01/21/17 again with SOB, hypoxic, this time discharge summary reported felt most likely 2/2 to respiratory depression associated with narcotic use along with marijuana.  He was again found in AF though rate controled.  During this stay he was found with metastatic prostate cancer to bone (explaining his hip pain), he was seen by oncology, Dr [name] who noted his cancer is not curable at this stage, but very treatable.  recommend testosterone deprivation as first-line treatment. We discussed the options of medical and surgical orchiectomy and started on Lupron with plans to do bone scan for staging/management.  He has seen PMD since discharge given ongoing marijuana use and issue with respiratory depression hospitalization, PMD no longer willing to rx narcotic and started on weaning plans with future pain management being deferred to oncology.   *** AF symptomatic? Rate control strategy? *** bleeding/eliquis *** compliance *** symptoms *** smoking? *** onc f/u?   Past Medical History:  Diagnosis Date  . Anginal pain ([place])   . Anxiety   . Arthritis    "hips, knees, thoracic back" (01/22/2017)  . CAP (community acquired pneumonia) 11/2016  . 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 COPD (chronic obstructive pulmonary disease) ([place])    "pearl study" pt thinks he is on Symbicort; pt noncompliant with COPD controller meds on/off due to financial reasons.  . Coronary atherosclerosis of unspecified type of vessel, native or graft 2005   MI, s/p PCI  with stent (pt reports 20+ interventions in the past, most recent cat 6/08 showed patent stents).  Normal LV function.  Nuclear stress test NEG 8/09.  . Depression    ?bipolar dx by psychiatrist?  . Diverticulosis 2006  . ED (erectile dysfunction)   . Gout   . Gouty  arthritis    Dr. Amil Amen  . Heart murmur   . Hematochezia 09/2012   Hyperplastic polyp, diverticulosis, and internal hemorrhoids found on colonoscopy 10/2012  . History of hiatal hernia   . Hyperkalemia 01/29/2017   Kayexalate 30 given x 1.  . Hyperlipidemia    Hx of multiple statin intolerance  . Hypertension   . 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 Metastasis from malignant neoplasm of prostate ([place])    "to spine & left hip; dx'd 01/21/2017"  . Migraine    "none in the 2000s" (01/15/2017)  . Mitral valve prolapse ~ 1981  . Myocardial infarction [facility]) ~ 1993- 2005 X 3-4   (01/15/2017)  . Neuropathic pain of both feet 2015/2016   Vit B12 and A1c normal 10/2014  . Obesities, morbid ([place])   . On home oxygen therapy    "2L prn" (01/15/2017)  . OSA on CPAP    w/oxygen (01/15/2017)  . Osteoarthrosis, unspecified whether generalized or localized, unspecified site    DDD  . PAF (paroxysmal atrial fibrillation) ([place]) 12/2016   Started in the context of resp illness/lots of bronchodilators.  Started eliquis and cardizem during hospitalization 01/2017 (Dr. Rayann Heman).  . Pre-diabetes 2016/17  . Prostate cancer ([place]) 09/2010   Localized, high risk disease (Dr. Kimbrough/Grapey):  s/p 5 wks ext bm rad + seed boost, as well as hormone blockade x 1 yr.  Biochemical recurrence 11/2015--urologist did bone scan and spots on T spine and L iliac region came up, pt needs plain films of pelvis/T spine to see if DJD correlate present.  He'll eventually need to resume androgen depriv therapy.  . Radiation    Pelvic--for prostate ca: 25 treatments/01/2011  . Seizure disorder ([place]) 08-15-2011   Deja vu sensations: no w/u.  These stopped when neurontin 300mg  tid was started for a different reason.  . Status post chemotherapy    10/2010 thru 06/2011    Past Surgical History:  Procedure Laterality Date  . CARDIAC CATHETERIZATION  23 caths  . COLONOSCOPY WITH PROPOFOL N/A 04/05/2013   Dr. Fuller Plan.   Polypectomy (hyperplastic--recall 10 yrs).  Moderate diverticulosis, +internal hemorrhoids.  No radiation proctitis.  . CORONARY ANGIOPLASTY    . CORONARY ANGIOPLASTY WITH STENT PLACEMENT     "5-6 stents" (01/15/2017)  . HERNIA REPAIR    . HOT HEMOSTASIS N/A 04/05/2013   Procedure: HOT HEMOSTASIS (ARGON PLASMA COAGULATION/BICAP);  Surgeon: Ladene Artist, MD;  Location: [facility];  Service: Endoscopy;  Laterality: N/A;  . INSERTION PROSTATE RADIATION SEED  02/24/2011  . TRANSTHORACIC ECHOCARDIOGRAM  12/2016   EF 55-60%, moderate LVH, grd I DD, mild LA dilation.  Marland Kitchen UMBILICAL HERNIA REPAIR      Current Outpatient Prescriptions  Medication Sig Dispense Refill  . acetaminophen (TYLENOL) 500 MG tablet Take 2 tablets (1,000 mg total) by mouth 3 (three) times daily. 30 tablet 0  . albuterol (PROAIR HFA) 108 (90 Base) MCG/ACT inhaler Inhale 2 puffs into the lungs every 6 (six) hours as needed for wheezing or shortness of breath.    Marland Kitchen albuterol (PROVENTIL) (2.5 MG/3ML) 0.083% nebulizer solution USE ONE VIAL IN NEBULIZER EVERY 4 HOURS AS NEEDED FOR  SHORTNESS OF BREATH OR WHEEZING    . allopurinol (ZYLOPRIM) 300 MG tablet Take 1 tablet (300 mg total) by mouth daily. 90 tablet 3  . apixaban (ELIQUIS) 5 MG TABS tablet Take 1 tablet (5 mg total) by mouth 2 (two) times daily. 60 tablet 0  . bicalutamide (CASODEX) 50 MG tablet Take 1 tablet (50 mg total) by mouth daily. 30 tablet 0  . bisoprolol-hydrochlorothiazide (ZIAC) 5-6.25 MG tablet Take 1 tablet by mouth every morning. 90 tablet 3  . budesonide-formoterol (SYMBICORT) 160-4.5 MCG/ACT inhaler Inhale 2 puffs into the lungs 2 (two) times daily. 3 Inhaler 3  . citalopram (CELEXA) 40 MG tablet Take 0.5 tablets (20 mg total) by mouth at bedtime. 90 tablet 3  . cyclobenzaprine (FLEXERIL) 10 MG tablet Take 1 tablet (10 mg total) by mouth at bedtime. 90 tablet 1  . dextromethorphan-guaiFENesin (MUCINEX DM) 30-600 MG 12hr tablet Take 1 tablet by mouth 2 (two)  times daily as needed for cough.    . diltiazem (CARDIZEM) 30 MG tablet Take 1 tablet (30 mg total) by mouth every 12 (twelve) hours. 60 tablet 0  . feeding supplement, ENSURE ENLIVE, (ENSURE ENLIVE) LIQD Take 237 mLs by mouth 2 (two) times daily between meals. 237 mL 12  . furosemide (LASIX) 40 MG tablet Take 2 tablets (80 mg total) by mouth daily as needed. (Patient taking differently: Take 80 mg by mouth daily as needed for fluid. ) 180 tablet 1  . gabapentin (NEURONTIN) 300 MG capsule Take 300 mg by mouth 2 (two) times daily.     Marland Kitchen oxyCODONE 10 MG TABS Take 1 tablet (10 mg total) by mouth every 6 (six) hours as needed for moderate pain, severe pain or breakthrough pain. 30 tablet 0  . predniSONE (DELTASONE) 10 MG tablet Prednisone 60 mg daily for 3 days followed by  Prednisone 40 mg daily for 3 days followed by  Prednisone 20 mg daily for 3 days followed by Prednisone 10 mg daily for 3 days. 39 tablet 0  . Probiotic Product (PROBIOTIC PO) Take 1 capsule by mouth daily.    Marland Kitchen zolpidem (AMBIEN) 10 MG tablet Take 1 tablet (10 mg total) by mouth at bedtime as needed for sleep. 30 tablet 5   No current facility-administered medications for this visit.     Allergies:   Amitriptyline hcl and Ezetimibe   Social History:  The patient  reports that he quit smoking about 4 weeks ago. His smoking use included Cigarettes. He has a 54.00 pack-year smoking history. He has never used smokeless tobacco. He reports that he drinks alcohol. He reports that he uses drugs, including Marijuana.   Family History:  The patient's family history includes Arthritis in his mother; Bipolar disorder in his father; Heart disease in his father.  ROS:  Please see the history of present illness.   All other systems are reviewed and otherwise negative.   PHYSICAL EXAM: *** VS:  There were no vitals taken for this visit. BMI: There is no height or weight on file to calculate BMI. Well nourished, well developed, in no acute  distress  HEENT: normocephalic, atraumatic  Neck: no JVD, carotid bruits or masses Cardiac:  *** RRR; no significant murmurs, no rubs, or gallops Lungs:  *** CTA b/l, no wheezing, rhonchi or rales  Abd: soft, nontender MS: no deformity or *** atrophy Ext: *** no edema  Skin: warm and dry, no rash Neuro:  No gross deficits appreciated Psych: euthymic mood, full affect  EKG:   01/21/17 is SR 71bpm, PR 129ms, QRS 15ms 01/15/17: AFib/flutter 162bpm  01/06/17: TTE Study Conclusions - Left ventricle: The cavity size was normal. Wall thickness was   increased in a pattern of moderate LVH. Systolic function was   normal. The estimated ejection fraction was in the range of 55%   to 60%. Wall motion was normal; there were no regional wall   motion abnormalities. Doppler parameters are consistent with   abnormal left ventricular relaxation (grade 1 diastolic   dysfunction). - Aortic valve: There was no stenosis. - Aorta: Ascending aortic diameter: 37 mm (S). - Ascending aorta: The ascending aorta was borderline dilated. - Mitral valve: Mildly calcified annulus. There was no significant   regurgitation. - Left atrium: The atrium was mildly dilated. - Right ventricle: The cavity size was normal. Systolic function   was normal. - Pulmonary arteries: No complete TR doppler jet so unable to   estimate PA systolic pressure. - Systemic veins: IVC measured 2.3 cm with normal respirophasic   variation, suggesting RA pressure 8 mmHg. Impressions: - Normal LV size with moderate LV hypertrophy. EF 55-60%. Normal RV   size and systolic function. No significant valvular   abnormalities.  Recent Labs: 11/05/2016: TSH 0.444 01/15/2017: ALT 19; Magnesium 2.1 01/21/2017: B Natriuretic Peptide 55.1 01/22/2017: Hemoglobin 13.8; Platelets 263 01/28/2017: BUN 21; Creatinine, Ser 0.87; Potassium 5.9; Sodium 139  10/31/2016: Cholesterol 211; HDL 37.30; LDL Cholesterol 146; Total CHOL/HDL Ratio 6; Triglycerides  137.0; VLDL 27.4   Estimated Creatinine Clearance: 109.8 mL/min (by C-G formula based on SCr of 0.87 mg/dL).   Wt Readings from Last 3 Encounters:  01/28/17 277 lb 12 oz (126 kg)  01/24/17 272 lb 9.6 oz (123.7 kg)  01/17/17 268 lb 8 oz (121.8 kg)     Other studies reviewed: Additional studies/records reviewed today include: summarized above  ASSESSMENT AND PLAN:  1. PAFib/flutter     CHA2DS2Vasc is at least 2, now on Eliquis     ***  2. HTN     ***  Disposition: F/u with ***  Current medicines are reviewed at length with the patient today.  The patient did not have any concerns regarding medicines.***  Signed, Jennings Books, PA-C 02/03/2017 9:56 AM     [facility] [place] [address] [phone] (office)  [phone] (fax)

## 2017-02-04 ENCOUNTER — Ambulatory Visit: Payer: Medicare Other | Admitting: Physician Assistant

## 2017-02-04 ENCOUNTER — Encounter: Payer: Self-pay | Admitting: *Deleted

## 2017-02-05 ENCOUNTER — Other Ambulatory Visit: Payer: Self-pay | Admitting: Pharmacist

## 2017-02-05 ENCOUNTER — Other Ambulatory Visit: Payer: Self-pay

## 2017-02-05 NOTE — Patient Outreach (Signed)
[facility]) Care Management  02/05/2017  NEYLAND PETTENGILL 1951/02/25 [id number]   Patient was called to follow up on medication assistance.  No answer.  HIPAA compliant message left on patient's voicemail.  Plan:  Call patient back within 10-14 business days.   [name], PharmD, [place] Clinical Pharmacist [phone]

## 2017-02-05 NOTE — Patient Outreach (Signed)
[facility]) Care Management  02/05/2017  Scott Rollins 05-21-1951 [id number]   Subjective: none  Objective: none  Assessment: 66 yr old with 3 admissions in 6 months. 4/24-4/27 for COPD with acute exacerbation; 7/5-7/7 for COPD exacerbation and atrial fibrillation; 7/11-7/15 for acute on chronic respiratory failure. 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   RNCM called for transition of care call. No answer. HIPPA compliant message left.  Plan: follow up next week if no return call.  [name], RN, MSN, [place] Coordinator Cell: [phone]

## 2017-02-06 ENCOUNTER — Ambulatory Visit: Payer: Medicare Other | Admitting: Pharmacist

## 2017-02-06 ENCOUNTER — Other Ambulatory Visit: Payer: Self-pay | Admitting: Pharmacist

## 2017-02-06 NOTE — Patient Outreach (Signed)
[facility]) Care Management  02/06/2017  ASAHD CAN 23-Nov-1950 [id number]   Patient was called to follow up on medication assistance. HIPAA identifiers were obtained. Patient said he received the applications that were sent to him for Anoro and Eliquis.    [place] was called to follow up on the status of the patient's application.  He was sent a temporary supply but more in formation was needed.  The previously required information was re-faxed to [name] on 01/26/17. However, the representative today stated the patient qualifies for "Extra Help" based on his income and would need to apply and get a denial letter from [employer].  It was explained to the representative that the patient does not have a Medicare Part D plan. He only has Medicare Parts A and B.    The representative said the patient will need a letter from his provider stating the following:  "Patient does not have a Medicare Part D (prescription plan) and is not willing to apply for one in the future".  The patient said he cannot afford a Medicare Part D plan.  This information was relayed to the patient who said he would get with his provider about writing the letter.  Plan:  Follow up with the patient 02/16/17 Route note to patient's PCP Send patient a letter with the information needed according to [employer]

## 2017-02-09 ENCOUNTER — Other Ambulatory Visit: Payer: Self-pay

## 2017-02-09 ENCOUNTER — Ambulatory Visit (HOSPITAL_BASED_OUTPATIENT_CLINIC_OR_DEPARTMENT_OTHER): Payer: Medicare Other | Admitting: Oncology

## 2017-02-09 ENCOUNTER — Telehealth: Payer: Self-pay

## 2017-02-09 VITALS — BP 167/75 | HR 84 | Temp 98.1°F | Resp 24 | Ht 70.0 in | Wt 274.2 lb

## 2017-02-09 DIAGNOSIS — G893 Neoplasm related pain (acute) (chronic): Secondary | ICD-10-CM

## 2017-02-09 DIAGNOSIS — C61 Malignant neoplasm of prostate: Secondary | ICD-10-CM

## 2017-02-09 DIAGNOSIS — C7951 Secondary malignant neoplasm of bone: Secondary | ICD-10-CM

## 2017-02-09 DIAGNOSIS — Z923 Personal history of irradiation: Secondary | ICD-10-CM

## 2017-02-09 MED ORDER — PREDNISONE 5 MG PO TABS: 5.0000 mg | ORAL_TABLET | Freq: Every day | ORAL | 3 refills | Status: DC

## 2017-02-09 MED ORDER — ABIRATERONE ACETATE 250 MG PO TABS: 1000.0000 mg | ORAL_TABLET | Freq: Every day | ORAL | 0 refills | Status: DC

## 2017-02-09 NOTE — Telephone Encounter (Signed)
appts made and avs printed per 7/30 los  [place]

## 2017-02-09 NOTE — Progress Notes (Signed)
Hematology and Oncology Follow Up Visit  Scott Rollins [id number] 03/16/1951 66 y.o. 02/09/2017 10:51 AM Scott Rollins, MDMcGowen, [name], MD   Principle Diagnosis: 66 year old gentleman with prostate cancer diagnosed in 2012. He presented with Gleason score 4+4 = 8 and PSA of 4.91. He has metastatic disease with lymphadenopathy and bone involvement documented in July 2018.   Prior Therapy:  He is status post external beam radiation after 4 months of androgen deprivation completed in 2012. He also had seed implant boost.  Current therapy:  Lupron started in July 2018 initially at 7.5 mg monthly and will be switched to 30 mg every 4 months starting August 2018.  Casodex at 50 mg daily will be given for 4 weeks only started on 01/24/2017.  Under consideration to start Zytiga and 1000 mg daily and 5 mg of prednisone.  Interim History:  [name] presents today for a follow-up visit. He is a pleasant gentleman with prostate cancer diagnosed in 2012 and underwent definitive therapy with androgen deprivation therapy, external beam radiation and seed implant boost. He remained disease-free and had a PSA rise in May 2017 up to 6.15. He was hospitalized in July 2018 for respiratory failure and his workup revealed diffuse lymphadenopathy and sclerotic bone lesions and his PSA was 138. He was treated with Lupron at 7.5 mg injection and Casodex was started on 01/24/2017. Since his discharge, he is feeling much better at this time with overall improvement in his pain. He reported that pelvic pain and leg pain which is manageable currently with oxycodone and Tylenol. He tolerated Lupron without any complications at this time. He reports some fatigue and hot flashes but for the most part reports no major complaints. His respiratory complaints of also improved.  He does not report any headaches or blurry vision, syncope or seizures. He does not report any fevers or chills or sweats. He does not  report any cough, wheezing or hemoptysis. He does not report any chest pain, palpitation or leg edema. He does not report any nausea, vomiting or abdominal pain. He does not report any frequency urgency or hesitancy. He does not report any skeletal complaints of arthralgias or myalgias. Remaining review of systems unremarkable.  Medications: I have reviewed the patient's current medications.  Current Outpatient Prescriptions  Medication Sig Dispense Refill  . abiraterone Acetate (ZYTIGA) 250 MG tablet Take 4 tablets (1,000 mg total) by mouth daily. Take on an empty stomach 1 hour before or 2 hours after a meal 120 tablet 0  . acetaminophen (TYLENOL) 500 MG tablet Take 2 tablets (1,000 mg total) by mouth 3 (three) times daily. 30 tablet 0  . albuterol (PROAIR HFA) 108 (90 Base) MCG/ACT inhaler Inhale 2 puffs into the lungs every 6 (six) hours as needed for wheezing or shortness of breath.    Marland Kitchen albuterol (PROVENTIL) (2.5 MG/3ML) 0.083% nebulizer solution USE ONE VIAL IN NEBULIZER EVERY 4 HOURS AS NEEDED FOR SHORTNESS OF BREATH OR WHEEZING    . allopurinol (ZYLOPRIM) 300 MG tablet Take 1 tablet (300 mg total) by mouth daily. 90 tablet 3  . apixaban (ELIQUIS) 5 MG TABS tablet Take 1 tablet (5 mg total) by mouth 2 (two) times daily. 60 tablet 0  . bicalutamide (CASODEX) 50 MG tablet Take 1 tablet (50 mg total) by mouth daily. 30 tablet 0  . bisoprolol-hydrochlorothiazide (ZIAC) 5-6.25 MG tablet Take 1 tablet by mouth every morning. 90 tablet 3  . budesonide-formoterol (SYMBICORT) 160-4.5 MCG/ACT inhaler Inhale 2 puffs into  the lungs 2 (two) times daily. 3 Inhaler 3  . citalopram (CELEXA) 40 MG tablet Take 0.5 tablets (20 mg total) by mouth at bedtime. 90 tablet 3  . cyclobenzaprine (FLEXERIL) 10 MG tablet Take 1 tablet (10 mg total) by mouth at bedtime. 90 tablet 1  . dextromethorphan-guaiFENesin (MUCINEX DM) 30-600 MG 12hr tablet Take 1 tablet by mouth 2 (two) times daily as needed for cough.    .  diltiazem (CARDIZEM) 30 MG tablet Take 1 tablet (30 mg total) by mouth every 12 (twelve) hours. 60 tablet 0  . feeding supplement, ENSURE ENLIVE, (ENSURE ENLIVE) LIQD Take 237 mLs by mouth 2 (two) times daily between meals. 237 mL 12  . furosemide (LASIX) 40 MG tablet Take 2 tablets (80 mg total) by mouth daily as needed. (Patient taking differently: Take 80 mg by mouth daily as needed for fluid. ) 180 tablet 1  . gabapentin (NEURONTIN) 300 MG capsule Take 300 mg by mouth 2 (two) times daily.     Marland Kitchen oxyCODONE 10 MG TABS Take 1 tablet (10 mg total) by mouth every 6 (six) hours as needed for moderate pain, severe pain or breakthrough pain. 30 tablet 0  . predniSONE (DELTASONE) 10 MG tablet Prednisone 60 mg daily for 3 days followed by  Prednisone 40 mg daily for 3 days followed by  Prednisone 20 mg daily for 3 days followed by Prednisone 10 mg daily for 3 days. 39 tablet 0  . predniSONE (DELTASONE) 5 MG tablet Take 1 tablet (5 mg total) by mouth daily with breakfast. 30 tablet 3  . Probiotic Product (PROBIOTIC PO) Take 1 capsule by mouth daily.    Marland Kitchen zolpidem (AMBIEN) 10 MG tablet Take 1 tablet (10 mg total) by mouth at bedtime as needed for sleep. 30 tablet 5   No current facility-administered medications for this visit.      Allergies:  Allergies  Allergen Reactions  . Amitriptyline Hcl Nausea Only    REACTION: nausea, elevated bp  . Ezetimibe Other (See Comments)    REACTION: chest pain, fatigue    Past Medical History, Surgical history, Social history, and Family History were reviewed and updated.   Physical Exam: Blood pressure (!) 167/75, pulse 84, temperature 98.1 F (36.7 C), temperature source Oral, resp. rate (!) 24, height 5\' 10"  (1.778 m), weight 274 lb 3.2 oz (124.4 kg), SpO2 93 %. ECOG: 1 General appearance: alert and cooperative without distress. Head: Normocephalic, without obvious abnormality no oral thrush or ulcers. Neck: no adenopathy Lymph nodes: Cervical,  supraclavicular, and axillary nodes normal. Heart:regular rate and rhythm, S1, S2 normal, no murmur, click, rub or gallop Lung:chest clear, no wheezing, rales, normal symmetric air entry Abdomin: soft, non-tender, without masses or organomegaly EXT:no erythema, induration, or nodules   Lab Results: Lab Results  Component Value Date   WBC 15.9 (H) 01/22/2017   HGB 13.8 01/22/2017   HCT 45.3 01/22/2017   MCV 90.8 01/22/2017   PLT 263 01/22/2017     Chemistry      Component Value Date/Time   NA 138 02/03/2017 1453   K 4.2 02/03/2017 1453   CL 100 02/03/2017 1453   CO2 24 02/03/2017 1453   BUN 30 (H) 02/03/2017 1453   CREATININE 0.91 02/03/2017 1453      Component Value Date/Time   CALCIUM 8.6 02/03/2017 1453   ALKPHOS 180 (H) 01/15/2017 0630   AST 20 01/15/2017 0630   ALT 19 01/15/2017 0630   BILITOT 2.7 (H) 01/15/2017 0630  Impression and Plan:   66 year old gentleman with the following issues:  1. Prostate cancer diagnosed in 2012. His Gleason score 4+4 = 8 and a PSA of 4.91. He was treated with androgen deprivation therapy, external beam radiation and seed implant boost. He developed recurrent disease in May 2017 with a PSA of 6.15. In July 2018 his PSA was 138 with bone metastasis as well as diffuse lymphadenopathy.  The natural course of this disease was discussed today with the patient and his wife. He has developed metastatic disease that is clearly incurable. Treatments for this disease includes treatment with androgen deprivation therapy in the form of Lupron indefinitely. The plan is to switch him to 30 mg every 4 months for better scheduling. Casodex will be given for 4 weeks to eliminate the flare phenomenon and will be discontinued in 02/24/2017.  The role for additional therapy in the form of Zytiga systemic chemotherapy was reviewed today. Given his high volume disease and high risk disease he will benefit from at least Zytiga. Risks and benefits of  this medication were reviewed today. Complications include nausea, fatigue, edema, hypokalemia among others were reviewed. The benefit is improvement in overall survival on average of at least 13 months. After discussion today, he is agreeable to proceed. We will start the approval process and hopefully get him started on on this medication after 02/24/2017.  2. Androgen deprivation therapy: He will continue Lupron 30 mg every 4 months starting 02/24/2017 and continued indefinitely.  3. Bone directed therapy: He will be a candidate for Xgeva after obtaining dental clearance. This will be addressed in future visits.  4. Pain: Appears to be manageable with oxycodone at 10 mg 3 times a day.  5. Follow-up: Will be in 2 weeks to receive Lupron and he will have September 2018 to follow his progress.   XAJOIN,OMVEH, MD 7/30/201810:51 AM

## 2017-02-10 ENCOUNTER — Other Ambulatory Visit: Payer: Self-pay

## 2017-02-10 NOTE — Patient Outreach (Signed)
[place] [facility] Care Management  02/10/2017  Scott Rollins July 01, 1951 [id number]  Subjective: none  Objective: none  Assessment: 66 yr old with 3 admissions in 6 months. 4/24-4/27 for COPD with acute exacerbation; 7/5-7/7 for COPD exacerbation and atrial fibrillation; 7/11-7/15 for acute on chronic respiratory failure. 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   RNCM called for transition of care call. No answer. HIPPA compliant message left. Second attempt.  Plan: await return call and follow up next week if no return call.  [name], RN, MSN, [place] Coordinator Cell: [phone]

## 2017-02-11 ENCOUNTER — Other Ambulatory Visit: Payer: Self-pay

## 2017-02-11 NOTE — Patient Outreach (Signed)
[facility]) Care Management  02/11/2017  Scott Rollins April 27, 1951 [id number]   Subjective: none  Objective: none  Assessment: 66 yr old with 3 admissions in 6 months. 4/24-4/27 for COPD with acute exacerbation; 7/5-7/7 for COPD exacerbation and atrial fibrillation; 7/11-7/15 for acute on chronic respiratory failure. 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   RNCM returned call for transition of care call. No answer. HIPPA compliant message left.   Plan: Follow up call tomorrow.  [name], RN, MSN, [place] Coordinator Cell: [phone]

## 2017-02-12 ENCOUNTER — Other Ambulatory Visit: Payer: Self-pay

## 2017-02-12 ENCOUNTER — Telehealth: Payer: Self-pay | Admitting: Pharmacist

## 2017-02-12 NOTE — Patient Outreach (Signed)
[place] [facility]) Care Management  02/12/2017  Scott Rollins August 30, 1950 [id number]  Subjective: none  Objective: none  Assessment: 66 yr old with 3 admissions in 6 months. 4/24-4/27 for COPD with acute exacerbation; 7/5-7/7 for COPD exacerbation and atrial fibrillation; 7/11-7/15 for acute on chronic respiratory failure. 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   RNCM returned call. No answer. HIPPA compliant message left.  Plan: continue to try to reach.  [name], RN, MSN, [place] Coordinator Cell: [phone]

## 2017-02-12 NOTE — Telephone Encounter (Signed)
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  Labs from current admissions and ED visits assessed, OK for treatment, noted elevated bilirubin from CMET on 7/5, Tbili=2.7, no repeat, previous value WNL on 0.7 on 11/05/16. There are hepatic dose adjustments from the manufacturer for Zytiga, this will be discussed with MD.  Patient with multiple hypertensive BPs in Epic, patient being followed by cardiology due to recent diagnosis of atrial fibrillation. BP will continue to be monitored on Zytiga therapy.  Current medication list in Epic reviewed, no significant DDIs with Zytiga identified:  Patient does not have prescription insurance coverage. Oral oncology Clinic will start the manufacturer assistance application process to try to obtain Zytiga at $0 out of pocket cost from the manufacturer.  Oral Oncology Clinic will continue to follow for medication acquisition issues, initial counseling and start date.  Johny Drilling, PharmD, BCPS, BCOP 02/12/2017 4:33 PM Oral Oncology Clinic [phone]

## 2017-02-12 NOTE — Patient Outreach (Signed)
[facility] [facility]) Care Management  [place]  02/12/2017   Scott Rollins [birth year] [id number]  Subjective: "I was really doing great when I got out. I have gotten a little cold and it wipes me out. I am doing the nebulizer and Symbicort. I am doing better than I was before I went into the hospital".  Objective: none (telephonic assessment)  Encounter Medications:  Outpatient Encounter Prescriptions as of 02/12/2017  Medication Sig  . acetaminophen (TYLENOL) 500 MG tablet Take 2 tablets (1,000 mg total) by mouth 3 (three) times daily.  Marland Kitchen albuterol (PROAIR HFA) 108 (90 Base) MCG/ACT inhaler Inhale 2 puffs into the lungs every 6 (six) hours as needed for wheezing or shortness of breath.  Marland Kitchen albuterol (PROVENTIL) (2.5 MG/3ML) 0.083% nebulizer solution USE ONE VIAL IN NEBULIZER EVERY 4 HOURS AS NEEDED FOR SHORTNESS OF BREATH OR WHEEZING  . allopurinol (ZYLOPRIM) 300 MG tablet Take 1 tablet (300 mg total) by mouth daily.  Marland Kitchen apixaban (ELIQUIS) 5 MG TABS tablet Take 1 tablet (5 mg total) by mouth 2 (two) times daily.  . bicalutamide (CASODEX) 50 MG tablet Take 1 tablet (50 mg total) by mouth daily.  . bisoprolol-hydrochlorothiazide (ZIAC) 5-6.25 MG tablet Take 1 tablet by mouth every morning.  . budesonide-formoterol (SYMBICORT) 160-4.5 MCG/ACT inhaler Inhale 2 puffs into the lungs 2 (two) times daily.  . citalopram (CELEXA) 40 MG tablet Take 0.5 tablets (20 mg total) by mouth at bedtime.  . cyclobenzaprine (FLEXERIL) 10 MG tablet Take 1 tablet (10 mg total) by mouth at bedtime.  Marland Kitchen dextromethorphan-guaiFENesin (MUCINEX DM) 30-600 MG 12hr tablet Take 1 tablet by mouth 2 (two) times daily as needed for cough.  . diltiazem (CARDIZEM) 30 MG tablet Take 1 tablet (30 mg total) by mouth every 12 (twelve) hours.  . feeding supplement, ENSURE ENLIVE, (ENSURE ENLIVE) LIQD Take 237 mLs by mouth 2 (two) times daily between meals.  . furosemide (LASIX) 40 MG tablet Take 2 tablets (80 mg  total) by mouth daily as needed. (Patient taking differently: Take 80 mg by mouth daily as needed for fluid. )  . gabapentin (NEURONTIN) 300 MG capsule Take 300 mg by mouth 2 (two) times daily.   Marland Kitchen oxyCODONE 10 MG TABS Take 1 tablet (10 mg total) by mouth every 6 (six) hours as needed for moderate pain, severe pain or breakthrough pain.  . Probiotic Product (PROBIOTIC PO) Take 1 capsule by mouth daily.  Marland Kitchen zolpidem (AMBIEN) 10 MG tablet Take 1 tablet (10 mg total) by mouth at bedtime as needed for sleep.  Marland Kitchen abiraterone Acetate (ZYTIGA) 250 MG tablet Take 4 tablets (1,000 mg total) by mouth daily. Take on an empty stomach 1 hour before or 2 hours after a meal  . predniSONE (DELTASONE) 10 MG tablet Prednisone 60 mg daily for 3 days followed by  Prednisone 40 mg daily for 3 days followed by  Prednisone 20 mg daily for 3 days followed by Prednisone 10 mg daily for 3 days. (Patient not taking: Reported on 02/12/2017)  . predniSONE (DELTASONE) 5 MG tablet Take 1 tablet (5 mg total) by mouth daily with breakfast.   No facility-administered encounter medications on file as of 02/12/2017.     Functional Status:  In your present state of health, do you have any difficulty performing the following activities: 02/12/2017 01/22/2017  Hearing? N N  Vision? N N  Difficulty concentrating or making decisions? N N  Walking or climbing stairs? N Y  Comment just being  short of breath, but able to walk and climb shorter stairs "get out of breath"  Dressing or bathing? N N  Doing errands, shopping? N N  Preparing Food and eating ? N -  Using the Toilet? N -  In the past six months, have you accidently leaked urine? Y -  Comment a little incontinence due to prostate cancer wears pads. -  Do you have problems with loss of bowel control? N -  Managing your Medications? N -  Managing your Finances? N -  Housekeeping or managing your Housekeeping? N -  Some recent data might be hidden    Fall/Depression  Screening: Fall Risk  02/12/2017 11/13/2015  Falls in the past year? No No  Risk for fall due to : (No Data) -  Risk for fall due to: Comment "i have not fallen but i have been unsteady on my feet" -   PHQ 2/9 Scores 02/12/2017 11/13/2015  PHQ - 2 Score 0 3  PHQ- 9 Score - 6    Assessment:  66 yr old with 3 admissions in 6 months. 4/24-4/27 for COPD with acute exacerbation; 7/5-7/7 for COPD exacerbation and atrial fibrillation; 7/11-7/15 for acute on chronic respiratory failure. 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 Home oxygen 2 liters/nasal cannula at night and as needed. Client reports he has not needed to use.  Client reports he has seen oncologist and is going to be started on Prednisone and Zytiga, but has not started yet. He states he is waiting for the provider's office to let him know when to start.  Medications reviewed-client denies medication management issues, however, [facility] pharmacist assisting client with medication assistance (per client: Symbicort and Elliquis).  Follow up appointment reviewed. He does not see pulmonologist until 9/10. RNCM reviewed 3-2-1 action plan and encouraged client to follow up with pulmonologist sooner if needed. Client confirmed understanding.   History of heart failure-client denies any exacerbations with his hear within the past several years. Client does not weigh self. RNCM encouraged client to begin to weigh and record weights. Discussed when to call.  Scheduled appointments: Dr. Ross-Cardiologist 03/05/17 Dr. Gayla Medicus 03/23/17 Dr. Yolonda Kida 03/25/17    Plan: home visit next week. THN CM Care Plan Problem One     Most Recent Value  Care Plan Problem One  Risk for readmission related to COPD management as evidenced by recent hospitalization  Role Documenting the Problem One  Care Management [place] for Problem One  Active  [facility] Long Term Goal   Member will not be  readmitted to hospital within 31 days of discharge  [facility] Long Term Goal Start Date  01/26/17  Interventions for Problem One Long Term Goal  discussed 3-2-1 action plan, encouraged follow up with pulmonologist is symptoms prior to scheduled appointment.  [facility] CM Short Term Goal #1   Member will report taking medications as prescribed over the next 4 weeks.  THN CM Short Term Goal #1 Start Date  01/26/17  THN CM Short Term Goal #1 Met Date  02/12/17  THN CM Short Term Goal #2   Member will keep and attend follow up with primary MD within the next 2 weeks  THN CM Short Term Goal #2 Start Date  01/26/17  [facility] CM Short Term Goal #2 Met Date  02/12/17  THN CM Short Term Goal #3  client will verbalize COPD zone tool within the next 30 days.  THN CM Short Term  Goal #3 Start Date  02/12/17  Interventions for Short Tern Goal #3  discuss 3-2-1 action plan  THN CM Short Term Goal #4  client will begin to weigh and record weights within the next 30 days.  THN CM Short Term Goal #4 Start Date  02/12/17  Interventions for Short Term Goal #4  discussed the importance of monitoring weights.      [name], RN, MSN, [place] Coordinator Cell: [phone]

## 2017-02-16 ENCOUNTER — Telehealth: Payer: Self-pay | Admitting: *Deleted

## 2017-02-16 ENCOUNTER — Other Ambulatory Visit: Payer: Self-pay | Admitting: Pharmacist

## 2017-02-16 NOTE — Telephone Encounter (Signed)
As of right now, I show that he is on symbicort. This is the med that he needs to stay on until he sees pulmonary MD.  He should discuss any inhaler changes with pulm MD.-thx

## 2017-02-16 NOTE — Telephone Encounter (Signed)
Need Rx for Anoro (3 month supply). Paper work for [employer] has been completed. Please advise. Thanks.

## 2017-02-16 NOTE — Patient Outreach (Signed)
[facility]) Care Management  02/16/2017  Scott Rollins [birth year] [id number]   Patient was called to follow up on medication assistance. No answer. HIPAA compliant message left on patient's voice mail.  Applications were received back from the patient for Anoro and Eliquis.  Dr. Idelle Leech office was contacted (via inbox to [place]) for necessary information.   Plan: Await prescriptions and signed form from Dr. Idelle Leech office.  Follow up on medication assistance forms in 5-7 business days.   [name], PharmD, [place] Clinical Pharmacist [phone]

## 2017-02-16 NOTE — Telephone Encounter (Addendum)
I have sent a staff message to Alwyn Ren to get more information.   Renne Crigler,   I spoke with Dr. Anitra Lauth, he wants some clarification. Who started pt on the new Anoro? When did he start this? We have no record of this.   Thanks,  Nira Conn, [name]

## 2017-02-16 NOTE — Telephone Encounter (Signed)
Pls clarify meds with pharmD: I show that he is on symbicort, but I don't see any evidence of him being prescribed anoro ellipta. After I get this information I'll consider the question of writing rx for anoro.-thx

## 2017-02-16 NOTE — Telephone Encounter (Signed)
-----   Message from [name], [facility] sent at 02/16/2017  3:51 PM EDT ----- Regarding: Prescription for Tawanna Sat! It's worrisome Alwyn Ren from [facility] again!! I am reaching out to you on Mr. Delorse Lek.  I have new paperwork for Anoro and Eliquis. Luckily, [employer] does not require a provider signature but they do require a new prescription.  Anoro (like Spiriva) is not on the patient's medication list but he says it was started?  He doesn't see pulmonary until September.  I thought I would reach out to you guys since your office is his PCP and he has a lot going on right now.  If you can get me a prescription for Anoro 3 month supply, please fax it to me at:  [phone]  I will fax over the application for Eliquis as well.  Please complete the demographic information and have Dr. Ernestine Conrad sign the form and fax it back to me as well with a prescription for Eliquis.  [place] YOU!!!  [name], PharmD, [place] Clinical Pharmacist [phone]   Thank you so much!   Blessings,  [name], PharmD, [place] Clinical Pharmacist [phone]

## 2017-02-16 NOTE — Telephone Encounter (Signed)
Katina's responds  [name], RPH  Hel[place]ne [place] K, [name]        I had to go back and read my notes. Apparently, this was at the patient (Dr. Benita Gutter) request :) He told me he was discussing it with Dr. Anitra Lauth:)

## 2017-02-17 ENCOUNTER — Ambulatory Visit: Payer: Medicare Other

## 2017-02-17 NOTE — Telephone Encounter (Signed)
See message below from Dr. Anitra Lauth.

## 2017-02-17 NOTE — Telephone Encounter (Signed)
Ok thank you 

## 2017-02-18 ENCOUNTER — Telehealth: Payer: Self-pay | Admitting: Family Medicine

## 2017-02-18 ENCOUNTER — Other Ambulatory Visit: Payer: Self-pay

## 2017-02-18 ENCOUNTER — Other Ambulatory Visit: Payer: Self-pay | Admitting: Pharmacist

## 2017-02-18 NOTE — Patient Outreach (Signed)
[place] [facility]) Care Management  02/18/2017  Scott Rollins Dec 09, 1950 [id number]  Patient called and left a message requesting a copy of his [place] letter be sent to [employer] at [facility]    Patient was called back. HIPAA identifiers were obtained.  Patient was questioned about his previous request and the fax number he wanted the document faxed to was verified. The requested document was faxed to [facility] [place]  [phone].  Confirmation was received.  The application for Eliquis was faxed to Dr. Idelle Leech office on the patient's behalf as well attention [place].  The application and a prescription for Eliquis (90 day supply with refills) should be faxed back to me at the [place].  As was previously documented, the patient was given a temporary supply of Symbicort. [name] was called and they now need a letter from the provider verifying the patient does not have Medicare Part D.  Example:  To Whom It May Concern:  Scott Rollins DOB [birth year] does not have prescription drug coverage. Although he has Medicare Parts A &B, he does not have Part D due to financial hardship.   The hardship letter, the Eliquis application and the prescription needed for Eliquis can be faxed back to me at the [facility] Office: [phone]  Plan: Note will be routed to [facility] on behalf of Dr. Anitra Lauth

## 2017-02-18 NOTE — Telephone Encounter (Signed)
Patient calling to request letter from pcp to be written to [name] stating he does not have Medicare Part D, that he only has Part A&B.  This is so he can get his Symbicort rx through their patient assistance program.  Please fax letter to:  [place] Patient ID # [id number]

## 2017-02-18 NOTE — Patient Outreach (Signed)
[place] [facility]) Care Management  02/18/2017  Scott Rollins [birth year] [id number]   Subjective: none  Objective: none  Assessment: 66 yr old with 3 admissions in 6 months. 4/24-4/27 for COPD with acute exacerbation; 7/5-7/7 for COPD exacerbation and atrial fibrillation; 7/11-7/15 for acute on chronic respiratory failure. 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 Home oxygen 2 liters/nasal cannula at night and as needed  RNCM received call from client requesting to reschedule home visit due to conflicting appointment and request assessment be completed telephonically. RNCM called to complete assessment. No answer. HIPPA compliant message left.   Plan: RNCM will continue to attempt to reach client.  [name], RN, MSN, [place] Coordinator Cell: [phone]

## 2017-02-19 ENCOUNTER — Other Ambulatory Visit: Payer: Self-pay | Admitting: Pharmacist

## 2017-02-19 ENCOUNTER — Ambulatory Visit: Payer: Self-pay

## 2017-02-19 ENCOUNTER — Encounter: Payer: Self-pay | Admitting: *Deleted

## 2017-02-19 MED ORDER — APIXABAN 5 MG PO TABS: 5.0000 mg | ORAL_TABLET | Freq: Two times a day (BID) | ORAL | 3 refills | Status: DC

## 2017-02-19 NOTE — Telephone Encounter (Signed)
Form, letter and prescription have been faxed to [facility] at [phone].

## 2017-02-19 NOTE — Telephone Encounter (Signed)
I have printed the letter, prescription and filled out the form. Please sign all forms. Thanks.

## 2017-02-19 NOTE — Telephone Encounter (Signed)
Forms received and will be faxed to the respective companies.  [name], PharmD, [place] Clinical Pharmacist [phone]

## 2017-02-19 NOTE — Telephone Encounter (Signed)
OK, forms signed.-thx

## 2017-02-19 NOTE — Telephone Encounter (Signed)
-----   Message from [name], [facility] sent at 02/18/2017  1:54 PM EDT ----- [name]! Please see the attached note. I sent the Eliquis application physician page over. Please have Dr. Ernestine Conrad sign it and fax the application page and a prescription back to me at: [phone].  I will handle getting the application and documentation faxed to the company on the patient's behalf. In addition to the application, the patient needs a printed letter to verify he does not have prescription drug insurance.  This is a requirement of [employer] (the company that provided him with Symbicort).     Thank you so much for your continued support, effort, and help!  Blessings,  [name], PharmD, [place] Clinical Pharmacist [phone]

## 2017-02-19 NOTE — Patient Outreach (Signed)
[facility]) Care Management  02/19/2017  KIJUAN GALLICCHIO 1951/04/20 [id number]  Letter of proof patient does not have Medicare Part D and the signed application for [employer] (Eliquis) was received from the provider's office.  The Medicare letter was faxed to [employer].  The Patient Assistance Application was faxed to [place].   [name], PharmD, [place] Clinical Pharmacist [phone]

## 2017-02-20 ENCOUNTER — Telehealth: Payer: Self-pay | Admitting: *Deleted

## 2017-02-20 ENCOUNTER — Ambulatory Visit: Payer: Self-pay | Admitting: Pharmacist

## 2017-02-20 NOTE — Telephone Encounter (Signed)
Spoke with patient and made him aware that I would place two weeks of samples at the front desk. I reiterated the need to keep his upcoming appointment to re-establish with [name]. He is aware that further need for samples will need to be addressed by [facility]. Patient very appreciative for assistance.

## 2017-02-20 NOTE — Telephone Encounter (Signed)
Okay to give him 2 weeks and let him know he will need to keep appointment and follow up with [facility] for further samples if needed.

## 2017-02-20 NOTE — Telephone Encounter (Signed)
Patient called and requested samples of eliquis. He stated that [facility] is helping him with patient assistance for this and he called their office but they did not have any samples. He then called his PCP but he stated that they did not have any samples either and they told him to call our office. This med was ordered in the hospital by internal medicine. Okay to give samples? Please advise. Thanks, MI

## 2017-02-20 NOTE — Telephone Encounter (Signed)
Pt called and LMOM on 02/20/17 at 12:47pm wanting to know what would happen if he stopped taking the Eliquis. He stated that he has 1 tablet left and the approval for assistance will not be done for another 2 weeks. Pt stated that he called [facility] to see if he can get some samples. Please advise. Thanks.

## 2017-02-20 NOTE — Telephone Encounter (Signed)
Patient states that he called [facility] today and they informed him that they did not have any samples and he takes his last dose tomorrow. He saw [name] on 12/22/16 but was told to f/u PRN with him. He saw [name] previously and has an upcoming appointment this month to re-establish with her.

## 2017-02-20 NOTE — Telephone Encounter (Signed)
If we do not follow the patient we can not give samples.  THN can get samples for him.  I do not see where he has ever seen one of our providers.

## 2017-02-22 NOTE — Telephone Encounter (Signed)
If he doesn't take the anticoagulant, he has a 5-10 times greater chance of having a stroke compared to when he takes the anticoagulant.  If cardiology does not have any samples for him, our only option to offer would be lovenox injections until his eliquis comes in, and I'm not sure how costly this would be.-thx

## 2017-02-23 ENCOUNTER — Other Ambulatory Visit: Payer: Self-pay

## 2017-02-23 NOTE — Patient Outreach (Signed)
[facility]) Care Management  02/23/2017  Scott Rollins [birth year] [id number]   Subjective: none  Objective: none  Assessment: 66 yr old with 3 admissions in 6 months. 4/24-4/27 for COPD with acute exacerbation; 7/5-7/7 for COPD exacerbation and atrial fibrillation; 7/11-7/15 for acute on chronic respiratory failure. 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 Home Oxygen use.  RNCM called for transition of care and to complete assessment. Person answering phone stated client was not home. HIPPA compliant message left.  Plan: Await return call and continue to follow.   [name], RN, MSN, [place] Coordinator Cell: [phone]

## 2017-02-23 NOTE — Telephone Encounter (Signed)
Left message for pt to call back  °

## 2017-02-24 ENCOUNTER — Other Ambulatory Visit: Payer: Medicare Other

## 2017-02-24 ENCOUNTER — Telehealth: Payer: Self-pay | Admitting: Pharmacy Technician

## 2017-02-24 ENCOUNTER — Telehealth: Payer: Self-pay | Admitting: Internal Medicine

## 2017-02-24 ENCOUNTER — Ambulatory Visit (HOSPITAL_BASED_OUTPATIENT_CLINIC_OR_DEPARTMENT_OTHER): Payer: Medicare Other

## 2017-02-24 VITALS — BP 168/85 | HR 95 | Temp 98.1°F | Resp 22

## 2017-02-24 DIAGNOSIS — C7951 Secondary malignant neoplasm of bone: Secondary | ICD-10-CM | POA: Diagnosis not present

## 2017-02-24 DIAGNOSIS — C61 Malignant neoplasm of prostate: Secondary | ICD-10-CM

## 2017-02-24 DIAGNOSIS — Z5111 Encounter for antineoplastic chemotherapy: Secondary | ICD-10-CM | POA: Diagnosis present

## 2017-02-24 MED ORDER — DILTIAZEM HCL 30 MG PO TABS: 30.0000 mg | ORAL_TABLET | Freq: Two times a day (BID) | ORAL | 10 refills | Status: DC

## 2017-02-24 MED ORDER — LEUPROLIDE ACETATE (4 MONTH) 30 MG IM KIT
30.0000 mg | PACK | Freq: Once | INTRAMUSCULAR | Status: AC
  Administered 2017-02-24: 30 mg via INTRAMUSCULAR
  Filled 2017-02-24: qty 30

## 2017-02-24 NOTE — Telephone Encounter (Signed)
Oral Oncology Patient Advocate Encounter  Met patient in [place] to complete application for Wynetta Emery and [place] in an effort to reduce patient's out of pocket expense for Zytiga to $0.    Application completed and faxed to [phone].   [employer] patient assistance phone number for follow up is [phone].   This encounter will be updated until final determination.   Fabio Asa. Melynda Keller, [place] [facility] [phone] 02/24/2017 3:26 PM

## 2017-02-24 NOTE — Telephone Encounter (Signed)
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  Counseled patient on administration, dosing, side effects, safe handling, and monitoring. Patient will take Zytiga 250mg  tablets, 4 tablets (1000mg ) by mouth once daily on an empty stomach, 1 hour before, or 2 hours after a meal. Prednisone 5mg  tablets, 1 tablets by mouth once daily will be taken at breakfast.. Oral Oncology Clinic will continue to follow patient's labs for hepatic dose adjustments.  Side effects include but not limited to: fatigue, hot flush, arthralgias, LE edema, hypertension, and GI upset.    Reviewed with patient importance of keeping a medication schedule and plan for any missed doses.  Mr. Coster voiced understanding and appreciation.   All questions answered.  Will follow up with patient regarding manufacturer assistance application for Zytiga acquisition.  Patient knows to call the office with questions or concerns. Oral Oncology Clinic will continue to follow.  Thank you,  Johny Drilling, PharmD, BCPS, BCOP 02/24/2017  4:48 PM Oral Oncology Clinic [phone]

## 2017-02-24 NOTE — Patient Instructions (Signed)
Leuprolide depot injection What is this medicine? LEUPROLIDE (loo PROE lide) is a man-made protein that acts like a natural hormone in the body. It decreases testosterone in men and decreases estrogen in women. In men, this medicine is used to treat advanced prostate cancer. In women, some forms of this medicine may be used to treat endometriosis, uterine fibroids, or other male hormone-related problems. This medicine may be used for other purposes; ask your health care provider or pharmacist if you have questions. COMMON BRAND NAME(S): Eligard, Lupron Depot, Lupron Depot-Ped, Viadur What should I tell my health care provider before I take this medicine? They need to know if you have any of these conditions: -diabetes -heart disease or previous heart attack -high blood pressure -high cholesterol -mental illness -osteoporosis -pain or difficulty passing urine -seizures -spinal cord metastasis -stroke -suicidal thoughts, plans, or attempt; a previous suicide attempt by you or a family member -tobacco smoker -unusual vaginal bleeding (women) -an unusual or allergic reaction to leuprolide, benzyl alcohol, other medicines, foods, dyes, or preservatives -pregnant or trying to get pregnant -breast-feeding How should I use this medicine? This medicine is for injection into a muscle or for injection under the skin. It is given by a health care professional in a hospital or clinic setting. The specific product will determine how it will be given to you. Make sure you understand which product you receive and how often you will receive it. Talk to your pediatrician regarding the use of this medicine in children. Special care may be needed. Overdosage: If you think you have taken too much of this medicine contact a poison control center or emergency room at once. NOTE: This medicine is only for you. Do not share this medicine with others. What if I miss a dose? It is important not to miss a dose.  Call your doctor or health care professional if you are unable to keep an appointment. Depot injections: Depot injections are given either once-monthly, every 12 weeks, every 16 weeks, or every 24 weeks depending on the product you are prescribed. The product you are prescribed will be based on if you are male or male, and your condition. Make sure you understand your product and dosing. What may interact with this medicine? Do not take this medicine with any of the following medications: -chasteberry This medicine may also interact with the following medications: -herbal or dietary supplements, like black cohosh or DHEA -male hormones, like estrogens or progestins and birth control pills, patches, rings, or injections -male hormones, like testosterone This list may not describe all possible interactions. Give your health care provider a list of all the medicines, herbs, non-prescription drugs, or dietary supplements you use. Also tell them if you smoke, drink alcohol, or use illegal drugs. Some items may interact with your medicine. What should I watch for while using this medicine? Visit your doctor or health care professional for regular checks on your progress. During the first weeks of treatment, your symptoms may get worse, but then will improve as you continue your treatment. You may get hot flashes, increased bone pain, increased difficulty passing urine, or an aggravation of nerve symptoms. Discuss these effects with your doctor or health care professional, some of them may improve with continued use of this medicine. Male patients may experience a menstrual cycle or spotting during the first months of therapy with this medicine. If this continues, contact your doctor or health care professional. What side effects may I notice from receiving this medicine? Side   effects that you should report to your doctor or health care professional as soon as possible: -allergic reactions like skin  rash, itching or hives, swelling of the face, lips, or tongue -breathing problems -chest pain -depression or memory disorders -pain in your legs or groin -pain at site where injected or implanted -seizures -severe headache -swelling of the feet and legs -suicidal thoughts or other mood changes -visual changes -vomiting Side effects that usually do not require medical attention (report to your doctor or health care professional if they continue or are bothersome): -breast swelling or tenderness -decrease in sex drive or performance -diarrhea -hot flashes -loss of appetite -muscle, joint, or bone pains -nausea -redness or irritation at site where injected or implanted -skin problems or acne This list may not describe all possible side effects. Call your doctor for medical advice about side effects. You may report side effects to FDA at 1-800-FDA-1088. Where should I keep my medicine? This drug is given in a hospital or clinic and will not be stored at home. NOTE: This sheet is a summary. It may not cover all possible information. If you have questions about this medicine, talk to your doctor, pharmacist, or health care provider.  2018 Elsevier/Gold Standard (2015-12-13 09:45:53)  

## 2017-02-24 NOTE — Telephone Encounter (Signed)
Called pt and left message informing him that his medication was sent to his pharmacy as requested and if he has any other problems,questions or concerns to call the office. Confirmation received.

## 2017-02-24 NOTE — Telephone Encounter (Signed)
NEW MESSAGE   *STAT* If patient is at the pharmacy, call can be transferred to refill team.   1. Which medications need to be refilled? (please list name of each medication and dose if known)  DILTIAZEM 30MG   2. Which pharmacy/location (including street and city if local pharmacy) is medication to be sent to? [facility]   3. Do they need a 30 day or 90 day supply?  90DAY  PT REQUESTED TO BE CALLED WHEN THIS HAS BEEN SENT IN

## 2017-02-24 NOTE — Addendum Note (Signed)
Addended by: Derl Barrow on: 02/24/2017 04:51 PM   Modules accepted: Orders

## 2017-02-25 ENCOUNTER — Other Ambulatory Visit: Payer: Self-pay

## 2017-02-25 ENCOUNTER — Other Ambulatory Visit: Payer: Self-pay | Admitting: Pharmacist

## 2017-02-25 LAB — PSA: PROSTATE SPECIFIC AG, SERUM: 18.4 ng/mL — AB (ref 0.0–4.0)

## 2017-02-25 NOTE — Patient Outreach (Signed)
[facility]) Care Management  02/25/2017  Scott Rollins 1950/10/12 [id number]   Subjective: none  Objective: none  Assessment: 66 yr old with 3 admissions in 6 months. 4/24-4/27 for COPD with acute exacerbation; 7/5-7/7 for COPD exacerbation and atrial fibrillation; 7/11-7/15 for acute on chronic respiratory failure. 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 Home Oxygen use.  RNCM called to follow up; reschedule home visit; complete assesssment. Client currently in the Transition of care program. No answer. HIPPA compliant message left. 3rd outreach attempt.  Plan: await return call. Geophysicist/field seismologist.    [name], RN, MSN, [place] Coordinator Cell: [phone]

## 2017-02-25 NOTE — Patient Outreach (Signed)
[facility]) Care Management  [place]  02/25/2017   Scott Rollins 1951/06/03 [id number]  Subjective: "I been doing fine, just fine. I am feeling a lot better about everything that is going on".  Objective: none-telephonic call.  Encounter Medications:  Outpatient Encounter Prescriptions as of 02/25/2017  Medication Sig Note  . acetaminophen (TYLENOL) 500 MG tablet Take 2 tablets (1,000 mg total) by mouth 3 (three) times daily.   Marland Kitchen albuterol (PROAIR HFA) 108 (90 Base) MCG/ACT inhaler Inhale 2 puffs into the lungs every 6 (six) hours as needed for wheezing or shortness of breath.   Marland Kitchen albuterol (PROVENTIL) (2.5 MG/3ML) 0.083% nebulizer solution USE ONE VIAL IN NEBULIZER EVERY 4 HOURS AS NEEDED FOR SHORTNESS OF BREATH OR WHEEZING   . allopurinol (ZYLOPRIM) 300 MG tablet Take 1 tablet (300 mg total) by mouth daily.   Marland Kitchen apixaban (ELIQUIS) 5 MG TABS tablet Take 1 tablet (5 mg total) by mouth 2 (two) times daily.   . bisoprolol-hydrochlorothiazide (ZIAC) 5-6.25 MG tablet Take 1 tablet by mouth every morning.   . budesonide-formoterol (SYMBICORT) 160-4.5 MCG/ACT inhaler Inhale 2 puffs into the lungs 2 (two) times daily.   . citalopram (CELEXA) 40 MG tablet Take 0.5 tablets (20 mg total) by mouth at bedtime. 02/25/2017: States he is taking a whole tablet 40 mg at bedtime.  . cyclobenzaprine (FLEXERIL) 10 MG tablet Take 1 tablet (10 mg total) by mouth at bedtime. 02/25/2017: States he is taking 58m daily at night.    . dextromethorphan-guaiFENesin (MUCINEX DM) 30-600 MG 12hr tablet Take 1 tablet by mouth 2 (two) times daily as needed for cough.   . diltiazem (CARDIZEM) 30 MG tablet Take 1 tablet (30 mg total) by mouth every 12 (twelve) hours.   . feeding supplement, ENSURE ENLIVE, (ENSURE ENLIVE) LIQD Take 237 mLs by mouth 2 (two) times daily between meals.   . furosemide (LASIX) 40 MG tablet Take 2 tablets (80 mg total) by mouth daily as needed. (Patient taking  differently: Take 80 mg by mouth daily as needed for fluid. )   . gabapentin (NEURONTIN) 300 MG capsule Take 300 mg by mouth 2 (two) times daily.    .Marland KitchenoxyCODONE 10 MG TABS Take 1 tablet (10 mg total) by mouth every 6 (six) hours as needed for moderate pain, severe pain or breakthrough pain.   . Probiotic Product (PROBIOTIC PO) Take 1 capsule by mouth daily.   .Marland Kitchenzolpidem (AMBIEN) 10 MG tablet Take 1 tablet (10 mg total) by mouth at bedtime as needed for sleep.   .Marland Kitchenabiraterone Acetate (ZYTIGA) 250 MG tablet Take 4 tablets (1,000 mg total) by mouth daily. Take on an empty stomach 1 hour before or 2 hours after a meal (Patient not taking: Reported on 02/25/2017)   . bicalutamide (CASODEX) 50 MG tablet Take 1 tablet (50 mg total) by mouth daily. (Patient not taking: Reported on 02/25/2017)   . predniSONE (DELTASONE) 10 MG tablet Prednisone 60 mg daily for 3 days followed by  Prednisone 40 mg daily for 3 days followed by  Prednisone 20 mg daily for 3 days followed by Prednisone 10 mg daily for 3 days. (Patient not taking: Reported on 02/12/2017)   . predniSONE (DELTASONE) 5 MG tablet Take 1 tablet (5 mg total) by mouth daily with breakfast. (Patient not taking: Reported on 02/25/2017)    No facility-administered encounter medications on file as of 02/25/2017.     Functional Status:  In your present state of health,  do you have any difficulty performing the following activities: 02/12/2017 01/22/2017  Hearing? N N  Vision? N N  Difficulty concentrating or making decisions? N N  Walking or climbing stairs? N Y  Comment just being short of breath, but able to walk and climb shorter stairs "get out of breath"  Dressing or bathing? N N  Doing errands, shopping? N N  Preparing Food and eating ? N -  Using the Toilet? N -  In the past six months, have you accidently leaked urine? Y -  Comment a little incontinence due to prostate cancer wears pads. -  Do you have problems with loss of bowel control? N -   Managing your Medications? N -  Managing your Finances? N -  Housekeeping or managing your Housekeeping? N -  Some recent data might be hidden    Fall/Depression Screening: Fall Risk  02/12/2017 11/13/2015  Falls in the past year? No No  Risk for fall due to : (No Data) -  Risk for fall due to: Comment "i have not fallen but i have been unsteady on my feet" -   PHQ 2/9 Scores 02/12/2017 11/13/2015  PHQ - 2 Score 0 3  PHQ- 9 Score - 6    Assessment:  66 yr old with 3 admissions in 6 months. 4/24-4/27 for COPD with acute exacerbation; 7/5-7/7 for COPD exacerbation and atrial fibrillation; 7/11-7/15 for acute on chronic respiratory failure. 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 Home Oxygen use.  RNCM returned call to client who reports he is doing well. States he is following up with oncology for Lupron injections. He is speaking very positive about his experience thus far at the cancer center.   Client denies shortness of breath. Denies any COPD exacerbation symptoms. Client reports an upcoming appointment scheduled with pulmonologist. Appointment scheduled for September 10.  RNCM discussed home visits. Client prefers telephonic follow up adding he has a lot going on at home and with provider visits.  Plan: RNCM will call for transition of care next week.  THN CM Care Plan Problem One     Most Recent Value  Care Plan Problem One  Risk for readmission related to COPD management as evidenced by recent hospitalization  Role Documenting the Problem One  Care Management [place] for Problem One  Active  [facility] Long Term Goal   Member will not be readmitted to hospital within 31 days of discharge  [facility] Long Term Goal Start Date  01/26/17  Interventions for Problem One Long Term Goal  reviewed COPD action plan, encouraged client to call RNCM as needed, reinforced 24 hour nurse advice line.  THN CM Short Term Goal #1   Member will  report taking medications as prescribed over the next 4 weeks.  THN CM Short Term Goal #1 Start Date  01/26/17  THN CM Short Term Goal #1 Met Date  02/12/17  THN CM Short Term Goal #2   Member will keep and attend follow up with primary MD within the next 2 weeks  THN CM Short Term Goal #2 Start Date  01/26/17  [facility] CM Short Term Goal #2 Met Date  02/12/17  THN CM Short Term Goal #3  client will verbalize COPD zone tool within the next 30 days.  THN CM Short Term Goal #3 Start Date  02/12/17  Interventions for Short [place]ern Goal #3  reviwed COPD zone tool  THN CM Short Term Goal #  4  client will begin to weigh and record weights within the next 30 days.  THN CM Short Term Goal #4 Start Date  02/12/17      [name], RN, MSN, [place] Coordinator Cell: [phone]

## 2017-02-25 NOTE — Patient Outreach (Addendum)
[facility]) Care Management  02/25/2017  Scott Rollins 06-16-1951 [id number]  [name] Patient Assistance Program was called on the patient's behalf.  The representative at [place] confirmed they received the patient's application but had not processed it yet.  [place] was called.  The representative I spoke with confirmed they received the financial hardship letter that was faxed to them and that the patient is approved for their program through 07/13/2017.  Patient was called. Unfortunately, he did not answer the phone. HIPAA compliant message left on the patient's voicemail.   Plan:  Follow up with patient in 5-7 business days.  [name], PharmD, [place] Clinical Pharmacist [phone]  ADDENDUM  Patient called back. HIPAA identifiers were obtained. The status of both [employer] and the [employer] application was discussed with the patient. Patient said he has two weeks of Eliquis in his possession (samples).   Plan: Continue as above.   [name], PharmD, [place] Clinical Pharmacist [phone]

## 2017-02-26 NOTE — Telephone Encounter (Signed)
SW pt and he stated that he was able to some samples from his cardiologist. No further action needed at this time.

## 2017-02-27 NOTE — Telephone Encounter (Signed)
Oral Oncology Patient Advocate Encounter  Received notification from [place] [facility] [place] that patient has been successfully enrolled into their program to receive Zytiga from the manufacturer at $0 out of pocket until 02/27/2018.   I called and left a message for the patient with the good news and the phone number to [employer].    I advised him to call the office with any questions or concerns.  Oral Oncology Clinic will continue to follow.  Gilmore Laroche, CPhT, [place] Oral Oncology Patient Advocate [phone] 02/27/2017 2:21 PM

## 2017-03-04 ENCOUNTER — Other Ambulatory Visit: Payer: Self-pay | Admitting: Pharmacist

## 2017-03-04 NOTE — Patient Outreach (Signed)
[facility]) Care Management  03/04/2017  Scott Rollins 17-May-1951 [id number]   [place] Patient Assistance Program was called on the patient's behalf.  The representative at [place] confirmed they received the patient's application and it is still being processed. The representative recommended I call back in a week.  Patient was called to let him know the status of his application. No answer. HIPAA compliant message was left on the patient's voicemail.  Plan:  Follow up with patient and [place] in 5- 7 business days.   [name], PharmD, [place] Clinical Pharmacist [phone]

## 2017-03-05 ENCOUNTER — Other Ambulatory Visit: Payer: Self-pay | Admitting: Pharmacist

## 2017-03-05 ENCOUNTER — Other Ambulatory Visit: Payer: Self-pay

## 2017-03-05 ENCOUNTER — Ambulatory Visit: Payer: Medicare Other | Admitting: Internal Medicine

## 2017-03-05 NOTE — Patient Outreach (Signed)
[name]) Care Management  03/05/2017  Scott Rollins October 19, 1950 [id number]   Patient called me back. HIPAA identifiers were obtained. Patient was informed his Eliquis application is still being processed and that I will follow up with [place] next week.   Patient confirmed he received another 3 month supply of Symbicort today.  When asked how he was feeling, patient said he was a "little wheezy today".  He said he was not exceptionally short of breath.  He was encouraged to call his provider but said he wanted to try to use Mucinex and his rescue inhaler (in addition to Symbicort).     Plan:   Follow up with the patient in 3-5 business days.  [name], PharmD, [place] Clinical Pharmacist [phone]

## 2017-03-05 NOTE — Patient Outreach (Signed)
[facility]) Care Management  03/05/2017  Scott Rollins [birth year] [id number]  Subjective: none  Objective: none  Assessment: 66 yr old with 3 admissions in 6 months. 4/24-4/27 for COPD with acute exacerbation; 7/5-7/7 for COPD exacerbation and atrial fibrillation; 7/11-7/15 for acute on chronic respiratory failure. 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 Home Oxygen use.  Transition of care call. No answer. HIPPA compliant message left.   Plan: await return call.Transition of care call next week.  [name], RN, MSN, [place] Coordinator Cell: [phone]

## 2017-03-06 ENCOUNTER — Other Ambulatory Visit: Payer: Self-pay | Admitting: Pharmacist

## 2017-03-06 ENCOUNTER — Encounter: Payer: Self-pay | Admitting: Family Medicine

## 2017-03-06 ENCOUNTER — Other Ambulatory Visit: Payer: Self-pay | Admitting: *Deleted

## 2017-03-06 ENCOUNTER — Telehealth: Payer: Self-pay | Admitting: Family Medicine

## 2017-03-06 MED ORDER — ALBUTEROL SULFATE (2.5 MG/3ML) 0.083% IN NEBU: 2.5000 mg | INHALATION_SOLUTION | RESPIRATORY_TRACT | 3 refills | Status: DC | PRN

## 2017-03-06 MED ORDER — OXYCODONE HCL 10 MG PO TABS: 10.0000 mg | ORAL_TABLET | Freq: Four times a day (QID) | ORAL | 0 refills | Status: DC | PRN

## 2017-03-06 NOTE — Telephone Encounter (Signed)
Patient states he has changed pharmacies and he has been having difficulty with getting refill of albuterol (PROVENTIL) (2.5 MG/3ML) 0.083% nebulizer solution.  He is requesting a refill be sent today if possible to:   [facility], [place] [phone] (Phone) [phone] (Fax)

## 2017-03-06 NOTE — Telephone Encounter (Signed)
Rx sent to [facility]. Pt advised and voiced understanding.

## 2017-03-06 NOTE — Patient Outreach (Signed)
[facility]) Care Management  03/06/2017  BRENT TAILLON 11/15/1950 [id number]   [place] was called on the patient's behalf. The representative confirmed the patient was approved on 03/05/17 and that his prescription was sent to the pharmacy to be filled and shipped.  Patient was called to let him know the status of his application. HIPAA identifiers were obtained.  Plan:  Call patient back in 5-7 business days to see if he received his medication.   [name], PharmD, [place] Clinical Pharmacist [phone]

## 2017-03-06 NOTE — Telephone Encounter (Signed)
Oral Oncology Patient Advocate Encounter  Mr. Scott Rollins contacted me this morning.  He has received his first shipment of Zytiga from [place] [facility] [place] and plans to begin therapy today, 03/06/2017.  He did voice some concerns about being out of his pain medication.  He advised that he plans on contacting the office today.      Scott Rollins, [place] Patient [place] [phone] 03/06/2017 11:51 AM

## 2017-03-06 NOTE — Telephone Encounter (Signed)
Called patient to let him know his prescription is ready for pickup. He verbalized understanding.

## 2017-03-07 ENCOUNTER — Other Ambulatory Visit: Payer: Self-pay | Admitting: Oncology

## 2017-03-11 ENCOUNTER — Telehealth: Payer: Self-pay | Admitting: Internal Medicine

## 2017-03-11 ENCOUNTER — Ambulatory Visit: Payer: Self-pay | Admitting: Pharmacist

## 2017-03-11 NOTE — Telephone Encounter (Signed)
New message    Pt is calling asking about samples.  Patient calling the office for samples of medication:   1.  What medication and dosage are you requesting samples for? Eliquis 5 mg  2.  Are you currently out of this medication? No, two days left.

## 2017-03-11 NOTE — Telephone Encounter (Signed)
I called patient and made him re-iterated discussion that we had earlier this month as below. He stated that he will call [facility] and his pcp to see if they have any samples or find out if he can take something else until he receives his shipment from patient assistance.  Telephone   02/20/2017 [name] [address]  [name], [name], [name]   Conversation  ([name])  Me    02/20/17 2:39 PM  Note    Spoke with patient and made him aware that I would place two weeks of samples at the front desk. I reiterated the need to keep his upcoming appointment to re-establish with [name]. He is aware that further need for samples will need to be addressed by [facility]. Patient very appreciative for assistance.       02/20/17 1:26 PM  [name], RN routed this conversation to Me  [name], RN    02/20/17 1:26 PM  Note    Okay to give him 2 weeks and let him know he will need to keep appointment and follow up with [name] for further samples if needed.         02/20/17 12:58 PM  You routed this conversation to [name], RN  Me   02/20/17 12:53 PM  Note    Patient states that he called [name] today and they informed him that they did not have any samples and he takes his last dose tomorrow. He saw [name] on 12/22/16 but was told to f/u PRN with him. He saw [name] previously and has an upcoming appointment this month to re-establish with her.        02/20/17 12:44 PM  [name], RN routed this conversation to Me  [name], RN   02/20/17 12:43 PM  Note    If we do not follow the patient we can not give samples.  THN can get samples for him.  I do not see where he has ever seen one of our providers.       02/20/17 12:11 PM  You routed this conversation to [name], RN  Me   02/20/17 12:08 PM  Note    Patient called and requested samples of eliquis. He stated that [name] is helping him with patient assistance for this and he called their  office but they did not have any samples. He then called his PCP but he stated that they did not have any samples either and they told him to call our office. This med was ordered in the hospital by internal medicine. Okay to give samples? Please advise. Thanks, MI

## 2017-03-12 ENCOUNTER — Other Ambulatory Visit: Payer: Self-pay | Admitting: Pharmacist

## 2017-03-12 ENCOUNTER — Other Ambulatory Visit: Payer: Self-pay

## 2017-03-12 ENCOUNTER — Telehealth: Payer: Self-pay | Admitting: *Deleted

## 2017-03-12 NOTE — Telephone Encounter (Signed)
Discussed with [place] and she says as there is a long term plan okay to give 2 weeks worth.

## 2017-03-12 NOTE — Telephone Encounter (Signed)
Received a call from Denyse Amass, pharmacist for [facility] asking if we could provide the patient with at least one more week of eliquis samples. She states that the patient has been approved for patient assistance but he has not received his first shipment. It is expected to arrive any day now. She stated that he does not have enough medication to get through the weekend. See phone note from 02/20/17. Okay to provide samples? Please advise. Thanks, MI

## 2017-03-12 NOTE — Patient Outreach (Signed)
[place] [facility]) Care Management  03/12/2017  DARLY FAILS 1951-07-10 [id number]   Received a call from [place], [name] that patient was down to three days worth of Eliquis and was told he could not be given any more samples.  Dr. Alan Ripper office was called and I spoke with Bevelyn Buckles, [place].  It was explained that patient has been approved with [place] to receive Eliquis through 07/13/17 at no cost but it has not been delivered as of yet.  Mindy said she would check with her supervisor about making an exception for the patient.  Mindy put a note in the system that she called the patient and they would be providing the patient with an additional 2 weeks of therapy.  [place] Patient Assistance Program was called to check on the status of the patient's delivery.  The representative said the order had been given to their pharmacy/order processor, [employer].  [facility] was called to get an estimated delivery date.  [phone] Patient's ID number-[id number] order number [id number]  Patient should receive his medication within 5-7 business days.  Patient was called back and he communicated understanding.   Plan: Follow up with patient in 5-7 business days about delivery   [name], PharmD, [place] Clinical Pharmacist [phone]

## 2017-03-12 NOTE — Telephone Encounter (Signed)
Spoke with patient and made him aware that I would place two weeks supply at the front desk. Patient appreciative for my assistance.

## 2017-03-12 NOTE — Patient Outreach (Signed)
[name]) Care Management  03/12/2017  BROGAN Rollins [birth year] [id number]   Subjective: "I am ok, I am not feeling bad. I am short of breath but I am out of shape". I am retaining a lot of water, I am taking my lasix and I am having to urinate every 10-15 minutes".   Objective: none-telephonic call  Assessment: 66 yr old with 3 admissions in 6 months. 4/24-4/27 for COPD with acute exacerbation; 7/5-7/7 for COPD exacerbation and atrial fibrillation; 7/11-7/15 for acute on chronic respiratory failure. 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   1) medication assistance needs: Client reports he has only 4 Elliquis tablets left and he takes 5 mg twice a day. He reports calling his cardiology office and was told he could not have more samples. Scott Rollins states he received a letter that he has been approved, but they will not be able to get a shipment to him until Tuesday or Wednesday of next week.   2) COPD-client denies issues with COPD at this time. He states he is taking his medications as ordered and has a follow up appointment scheduled.  3) history of cardiac issues-atrial fibrillation/heart failure. RNCM discussed importance of daily weights. States he is having some fluid retention, but states he is taking lasix and is urinating a lot. However, client also states he drinks a lot of water, estimating he has an 18 oz cup and he drinks approximately 4-5 cups/day. Client acknowledges that he has not been weighing self. He state his weights were 270-274 pounds while in the hospital. Since January February has lost about 60 pounds. Denies swelling. Denies increased shortness of breath. RNCM reinforced daily weights can detect early signs of fluid retention. RNCM encouraged client to weigh daily and discussed the importance of follow up with cardiologist. Discussed heart failure zone tool.    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 Client without any questions or concerns. Client reports his pain is controlled and managed well with Pain medication as prescribed.   Upcoming appointments: Pulmonary 03/24/17 Oncology 03/25/17 Cardiology 04/02/17  RNCM spoke with [name], [facility] pharmacist regarding Elliquis issue. [name] to follow up.  Plan: home visit next week scheduled.  [name], RN, MSN, [place] Coordinator Cell: [phone]

## 2017-03-19 ENCOUNTER — Ambulatory Visit: Payer: Self-pay

## 2017-03-19 ENCOUNTER — Other Ambulatory Visit: Payer: Self-pay

## 2017-03-19 NOTE — Patient Outreach (Signed)
[place] [facility]) Care Management  03/19/2017  DEIVI HUCKINS [birth year] [id number]  Subjective: "I've got a sinus infection".  Objective: none-telephonic  Assessment: 66 yr old with 3 admissions in 6 months. 4/24-4/27 for COPD with acute exacerbation; 7/5-7/7 for COPD exacerbation and atrial fibrillation; 7/11-7/15 for acute on chronic respiratory failure. History of HTN, CAD, Heart failure, tobacco abuse, prostate cancer with bone metastasis.    RNCM called prior to home visit. Client request telephonic assessment today adding he and his daughter have "sinus infection" and would prefer no home visit today.  Client reports he has received his medications. Denies any questions or issues with medications at this time.  RNCM discussed COPD zone tool. reinforced when to call the doctor. Client reports he is taking Mucinex and his medications as prescribed. Client verbalized understanding and states he will call provider if "sinus infection" does not improve or condition worsens. Mr. Diffley states he has an appointment with pulmonologist next week. He also states he has an appointment with oncologst next week.  Plan: follow up call to assess status within the next 2-3 weeks.  [name], RN, MSN, [place] Coordinator Cell: [phone]

## 2017-03-20 ENCOUNTER — Other Ambulatory Visit: Payer: Self-pay | Admitting: Pharmacist

## 2017-03-20 ENCOUNTER — Telehealth: Payer: Self-pay | Admitting: *Deleted

## 2017-03-20 NOTE — Patient Outreach (Signed)
[facility]) Care Management  03/20/2017  Scott Rollins 06-15-1951 [id number]   [employer] was called to check on the status of his Eliquis shipment.  HIPAA identifiers were obtained.  Patient confirmed he had not received an Eliquis shipment.   [facility] was called to get an estimated delivery date.  [phone]   Patient's IDnumber-[id number] order number [id number]   Voicemail box full Called >5 times left on hold for >5 mintues then call disconnected.  Patient was called back. He said he would continue to call [facility] through out the day.  Plan:  Follow up in 1-2 business days.   [name], PharmD, [place] Clinical Pharmacist [phone]

## 2017-03-20 NOTE — Telephone Encounter (Signed)
Left message for pt to call back. Need to find out how he is taking his gabapentin.   We received refill request from [facility] and sig doesn't match ours.

## 2017-03-23 ENCOUNTER — Other Ambulatory Visit: Payer: Self-pay | Admitting: Pharmacist

## 2017-03-23 ENCOUNTER — Institutional Professional Consult (permissible substitution): Payer: Medicare Other | Admitting: Internal Medicine

## 2017-03-23 NOTE — Telephone Encounter (Signed)
Left message with pts nephew Dian Situ for pt to call back.

## 2017-03-23 NOTE — Patient Outreach (Signed)
[place] [facility]) Care Management  03/23/2017  [name] [birth year] [id number]   [place] were called on the patient's behalf.  Patient is approved through December 31. 2018 but delivery of the shipment has not been arranged.  The representative I spoke with today said he would make a special note in the patient's profile and someone from their office would call the patient to set up delivery.  Patient was called to inform him of the status be he did not answer. HIPAA compliant message was left on the patient's voicemail.  Plan:   Follow up with patient and [facility] in 3-5 business days.   [name], PharmD, [place] Clinical Pharmacist [phone]

## 2017-03-24 ENCOUNTER — Ambulatory Visit: Payer: Self-pay | Admitting: Pharmacist

## 2017-03-25 ENCOUNTER — Telehealth: Payer: Self-pay | Admitting: Oncology

## 2017-03-25 ENCOUNTER — Other Ambulatory Visit (HOSPITAL_BASED_OUTPATIENT_CLINIC_OR_DEPARTMENT_OTHER): Payer: Medicare Other

## 2017-03-25 ENCOUNTER — Ambulatory Visit (HOSPITAL_BASED_OUTPATIENT_CLINIC_OR_DEPARTMENT_OTHER): Payer: Medicare Other | Admitting: Oncology

## 2017-03-25 VITALS — BP 138/68 | HR 85 | Temp 98.6°F | Resp 20 | Ht 70.0 in | Wt 278.2 lb

## 2017-03-25 DIAGNOSIS — C61 Malignant neoplasm of prostate: Secondary | ICD-10-CM

## 2017-03-25 DIAGNOSIS — G893 Neoplasm related pain (acute) (chronic): Secondary | ICD-10-CM | POA: Diagnosis not present

## 2017-03-25 DIAGNOSIS — C7951 Secondary malignant neoplasm of bone: Secondary | ICD-10-CM

## 2017-03-25 DIAGNOSIS — Z923 Personal history of irradiation: Secondary | ICD-10-CM | POA: Diagnosis not present

## 2017-03-25 LAB — CBC WITH DIFFERENTIAL/PLATELET
BASO%: 0.7 % (ref 0.0–2.0)
Basophils Absolute: 0.1 10*3/uL (ref 0.0–0.1)
EOS ABS: 0.2 10*3/uL (ref 0.0–0.5)
EOS%: 1.3 % (ref 0.0–7.0)
HCT: 42.5 % (ref 38.4–49.9)
HEMOGLOBIN: 13.9 g/dL (ref 13.0–17.1)
LYMPH%: 10.5 % — AB (ref 14.0–49.0)
MCH: 28.4 pg (ref 27.2–33.4)
MCHC: 32.7 g/dL (ref 32.0–36.0)
MCV: 86.8 fL (ref 79.3–98.0)
MONO#: 0.6 10*3/uL (ref 0.1–0.9)
MONO%: 4.9 % (ref 0.0–14.0)
NEUT%: 82.6 % — ABNORMAL HIGH (ref 39.0–75.0)
NEUTROS ABS: 9.6 10*3/uL — AB (ref 1.5–6.5)
Platelets: 276 10*3/uL (ref 140–400)
RBC: 4.9 10*6/uL (ref 4.20–5.82)
RDW: 15.6 % — AB (ref 11.0–14.6)
WBC: 11.7 10*3/uL — AB (ref 4.0–10.3)
lymph#: 1.2 10*3/uL (ref 0.9–3.3)

## 2017-03-25 LAB — COMPREHENSIVE METABOLIC PANEL
ALBUMIN: 3.4 g/dL — AB (ref 3.5–5.0)
ALK PHOS: 140 U/L (ref 40–150)
ALT: 17 U/L (ref 0–55)
AST: 17 U/L (ref 5–34)
Anion Gap: 10 mEq/L (ref 3–11)
BILIRUBIN TOTAL: 0.54 mg/dL (ref 0.20–1.20)
BUN: 15.4 mg/dL (ref 7.0–26.0)
CO2: 31 meq/L — AB (ref 22–29)
CREATININE: 0.9 mg/dL (ref 0.7–1.3)
Calcium: 9.7 mg/dL (ref 8.4–10.4)
Chloride: 100 mEq/L (ref 98–109)
EGFR: 90 mL/min/{1.73_m2} — AB (ref >90)
GLUCOSE: 108 mg/dL (ref 70–140)
Potassium: 4.4 mEq/L (ref 3.5–5.1)
SODIUM: 141 meq/L (ref 136–145)
TOTAL PROTEIN: 7.1 g/dL (ref 6.4–8.3)

## 2017-03-25 MED ORDER — OXYCODONE HCL 10 MG PO TABS: 10.0000 mg | ORAL_TABLET | Freq: Four times a day (QID) | ORAL | 0 refills | Status: DC | PRN

## 2017-03-25 MED ORDER — PROCHLORPERAZINE MALEATE 10 MG PO TABS: 10.0000 mg | ORAL_TABLET | Freq: Four times a day (QID) | ORAL | 0 refills | Status: DC | PRN

## 2017-03-25 MED ORDER — TRAMADOL HCL 50 MG PO TABS: 50.0000 mg | ORAL_TABLET | Freq: Four times a day (QID) | ORAL | 1 refills | Status: DC | PRN

## 2017-03-25 NOTE — Progress Notes (Signed)
Hematology and Oncology Follow Up Visit  Scott Rollins [id number] 30-Dec-1950 66 y.o. 03/25/2017 3:37 PM Scott Rollins, MDMcGowen, [name], MD   Principle Diagnosis: 66 year old gentleman with prostate cancer diagnosed in 2012. He presented with Gleason score 4+4 = 8 and PSA of 4.91. He has metastatic disease with lymphadenopathy and bone involvement documented in July 2018.   Prior Therapy:  He is status post external beam radiation after 4 months of androgen deprivation completed in 2012. He also had seed implant boost. Casodex at 50 mg daily will be given for 4 weeks only started on 01/24/2017.  Current therapy:  Lupron started in July 2018 initially at 7.5 mg monthly and will be switched to 30 mg every 4 months starting August 2018.   Zytiga and 1000 mg daily and 5 mg of prednisone. Started in August 2018.  Interim History:  Scott Rollins presents today for a follow-up visit. Since the last visit, he started Zytiga and has tolerated it reasonably well. He does report some mild fatigue and some nausea. He also reported periodic increase in his bone pain after Lupron. He does take oxycodone at days every 6 hours. There are certain days where he does require a milder pain medication. He tolerated Lupron without any complications at this time. He denies any recent hospitalizations or illnesses. His appetite has been excellent. His quality of life and performance status not changed.  He does not report any headaches or blurry vision, syncope or seizures. He does not report any fevers or chills or sweats. He does not report any cough, wheezing or hemoptysis. He does not report any chest pain, palpitation or leg edema. He does not report any nausea, vomiting or abdominal pain. He does not report any frequency urgency or hesitancy. He does not report any skeletal complaints of arthralgias or myalgias. Remaining review of systems unremarkable.  Medications: I have reviewed the patient's  current medications.  Current Outpatient Prescriptions  Medication Sig Dispense Refill  . abiraterone Acetate (ZYTIGA) 250 MG tablet Take 4 tablets (1,000 mg total) by mouth daily. Take on an empty stomach 1 hour before or 2 hours after a meal 120 tablet 0  . acetaminophen (TYLENOL) 500 MG tablet Take 2 tablets (1,000 mg total) by mouth 3 (three) times daily. 30 tablet 0  . albuterol (PROAIR HFA) 108 (90 Base) MCG/ACT inhaler Inhale 2 puffs into the lungs every 6 (six) hours as needed for wheezing or shortness of breath.    Marland Kitchen albuterol (PROVENTIL) (2.5 MG/3ML) 0.083% nebulizer solution Take 3 mLs (2.5 mg total) by nebulization every 4 (four) hours as needed for wheezing or shortness of breath. 75 mL 3  . allopurinol (ZYLOPRIM) 300 MG tablet Take 1 tablet (300 mg total) by mouth daily. 90 tablet 3  . apixaban (ELIQUIS) 5 MG TABS tablet Take 1 tablet (5 mg total) by mouth 2 (two) times daily. 180 tablet 3  . bisoprolol-hydrochlorothiazide (ZIAC) 5-6.25 MG tablet Take 1 tablet by mouth every morning. 90 tablet 3  . budesonide-formoterol (SYMBICORT) 160-4.5 MCG/ACT inhaler Inhale 2 puffs into the lungs 2 (two) times daily. 3 Inhaler 3  . citalopram (CELEXA) 40 MG tablet Take 0.5 tablets (20 mg total) by mouth at bedtime. 90 tablet 3  . cyclobenzaprine (FLEXERIL) 10 MG tablet Take 1 tablet (10 mg total) by mouth at bedtime. 90 tablet 1  . dextromethorphan-guaiFENesin (MUCINEX DM) 30-600 MG 12hr tablet Take 1 tablet by mouth 2 (two) times daily as needed for cough.    Marland Kitchen  diltiazem (CARDIZEM) 30 MG tablet Take 1 tablet (30 mg total) by mouth every 12 (twelve) hours. 60 tablet 10  . feeding supplement, ENSURE ENLIVE, (ENSURE ENLIVE) LIQD Take 237 mLs by mouth 2 (two) times daily between meals. 237 mL 12  . furosemide (LASIX) 40 MG tablet Take 2 tablets (80 mg total) by mouth daily as needed. (Patient taking differently: Take 80 mg by mouth daily as needed for fluid. ) 180 tablet 1  . gabapentin (NEURONTIN)  300 MG capsule Take 300 mg by mouth 2 (two) times daily.     . Oxycodone HCl 10 MG TABS Take 1 tablet (10 mg total) by mouth every 6 (six) hours as needed. 60 tablet 0  . predniSONE (DELTASONE) 10 MG tablet Prednisone 60 mg daily for 3 days followed by  Prednisone 40 mg daily for 3 days followed by  Prednisone 20 mg daily for 3 days followed by Prednisone 10 mg daily for 3 days. (Patient not taking: Reported on 02/12/2017) 39 tablet 0  . predniSONE (DELTASONE) 5 MG tablet Take 1 tablet (5 mg total) by mouth daily with breakfast. 30 tablet 3  . Probiotic Product (PROBIOTIC PO) Take 1 capsule by mouth daily.    . prochlorperazine (COMPAZINE) 10 MG tablet Take 1 tablet (10 mg total) by mouth every 6 (six) hours as needed for nausea or vomiting. 30 tablet 0  . traMADol (ULTRAM) 50 MG tablet Take 1 tablet (50 mg total) by mouth every 6 (six) hours as needed. 30 tablet 1  . zolpidem (AMBIEN) 10 MG tablet Take 1 tablet (10 mg total) by mouth at bedtime as needed for sleep. 30 tablet 5   No current facility-administered medications for this visit.      Allergies:  Allergies  Allergen Reactions  . Amitriptyline Hcl Nausea Only    REACTION: nausea, elevated bp  . Ezetimibe Other (See Comments)    REACTION: chest pain, fatigue    Past Medical History, Surgical history, Social history, and Family History were reviewed and updated.   Physical Exam: Blood pressure 138/68, pulse 85, temperature 98.6 F (37 C), temperature source Oral, resp. rate 20, height 5\' 10"  (1.778 m), weight 278 lb 3.2 oz (126.2 kg), SpO2 93 %. ECOG: 1 General appearance: Well-appearing gentleman without distress. Head: Normocephalic, without obvious abnormality no oral ulcers or lesions. Neck: no adenopathy or masses. Lymph nodes: Cervical, supraclavicular, and axillary nodes normal. Heart:regular rate and rhythm, S1, S2 normal, no murmur, click, rub or gallop Lung:chest clear, no wheezing, rales, normal symmetric air  entry Abdomin: soft, non-tender, without masses or organomegaly EXT:no erythema, induration, or nodules   Lab Results: Lab Results  Component Value Date   WBC 11.7 (H) 03/25/2017   HGB 13.9 03/25/2017   HCT 42.5 03/25/2017   MCV 86.8 03/25/2017   PLT 276 03/25/2017     Chemistry      Component Value Date/Time   NA 138 02/03/2017 1453   K 4.2 02/03/2017 1453   CL 100 02/03/2017 1453   CO2 24 02/03/2017 1453   BUN 30 (H) 02/03/2017 1453   CREATININE 0.91 02/03/2017 1453      Component Value Date/Time   CALCIUM 8.6 02/03/2017 1453   ALKPHOS 180 (H) 01/15/2017 0630   AST 20 01/15/2017 0630   ALT 19 01/15/2017 0630   BILITOT 2.7 (H) 01/15/2017 0630         Impression and Plan:   65 year old gentleman with the following issues:  1. Prostate cancer diagnosed in  2012. His Gleason score 4+4 = 8 and a PSA of 4.91. He was treated with androgen deprivation therapy, external beam radiation and seed implant boost. He developed recurrent disease in May 2017 with a PSA of 6.15. In July 2018 his PSA was 138 with bone metastasis as well as diffuse lymphadenopathy.  He is currently receiving Zytiga 1000 mg daily with prednisone. He tolerated therapy reasonably well and the plan is to continue with the same dose and schedule. We will repeat liver function test and consider dose reduction has any liver function abnormalities.  2. Androgen deprivation therapy: He will continue Lupron 30 mg every 4 months. Last treatment given 02/24/2017 and will be repeated in December 2018.  3. Bone directed therapy: He will be a candidate for Xgeva after obtaining dental clearance. This will be addressed in future visits.  4. Pain: Appears to be manageable with oxycodone at 10 mg 3 times a day. I gave a prescription for Ultram to use for mild pain.  5. Nausea: Prescription for Compazine was made available to the patient.  6. Follow-up: Will be in 6 weeks to follow his progress.   Zola Button,  MD 9/12/20183:37 PM

## 2017-03-25 NOTE — Telephone Encounter (Signed)
Scheduled appt per 9/12 los - Gave patient AVS and calender per los.  

## 2017-03-26 ENCOUNTER — Other Ambulatory Visit: Payer: Self-pay | Admitting: Pharmacist

## 2017-03-26 ENCOUNTER — Telehealth: Payer: Self-pay | Admitting: *Deleted

## 2017-03-26 LAB — PSA: Prostate Specific Ag, Serum: 8.5 ng/mL — ABNORMAL HIGH (ref 0.0–4.0)

## 2017-03-26 MED ORDER — GABAPENTIN 300 MG PO CAPS: 300.0000 mg | ORAL_CAPSULE | Freq: Three times a day (TID) | ORAL | 6 refills | Status: DC

## 2017-03-26 NOTE — Telephone Encounter (Signed)
Rx sent in by Dr. Anitra Lauth.

## 2017-03-26 NOTE — Telephone Encounter (Signed)
As noted below by Dr. Shadad, I informed patient of his PSA level. He verbalized understanding.  

## 2017-03-26 NOTE — Telephone Encounter (Signed)
Patient states he is taking Gabapentin 300 mg, 3 times daily.  [facility].

## 2017-03-26 NOTE — Telephone Encounter (Signed)
-----   Message from Wyatt Portela, MD sent at 03/26/2017  8:55 AM EDT ----- Please let him know his PSA is down to 8.5

## 2017-03-31 ENCOUNTER — Other Ambulatory Visit: Payer: Self-pay | Admitting: *Deleted

## 2017-03-31 MED ORDER — ABIRATERONE ACETATE 250 MG PO TABS: 1000.0000 mg | ORAL_TABLET | Freq: Every day | ORAL | 0 refills | Status: DC

## 2017-04-01 ENCOUNTER — Ambulatory Visit: Payer: Self-pay | Admitting: Family Medicine

## 2017-04-01 NOTE — Progress Notes (Deleted)
OFFICE VISIT  04/01/2017   CC: No chief complaint on file.  HPI:    Patient is a 66 y.o. Caucasian male who presents for f/u COPD, HTN, and discussion of medications. He has prostate cancer, recurrent--metastatic to bone.  Also, new dx PAF 12/2016, started on cardizem and eliquis--followed by Dr. Rayann Heman.  Past Medical History:  Diagnosis Date  . Anginal pain ([place])   . Anxiety   . Arthritis    "hips, knees, thoracic back" (01/22/2017)  . CAP (community acquired pneumonia) 11/2016  . 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 COPD (chronic obstructive pulmonary disease) ([place])    "pearl study" pt thinks he is on Symbicort; pt noncompliant with COPD controller meds on/off due to financial reasons.  . Coronary atherosclerosis of unspecified type of vessel, native or graft 2005   MI, s/p PCI with stent (pt reports 20+ interventions in the past, most recent cat 6/08 showed patent stents).  Normal LV function.  Nuclear stress test NEG 8/09.  . Depression    ?bipolar dx by psychiatrist?  . Diverticulosis 2006  . ED (erectile dysfunction)   . Gout   . Gouty arthritis    Dr. Amil Amen  . Heart murmur   . Hematochezia 09/2012   Hyperplastic polyp, diverticulosis, and internal hemorrhoids found on colonoscopy 10/2012  . History of hiatal hernia   . Hyperkalemia 01/29/2017   Kayexalate 30 given x 1.  . Hyperlipidemia    Hx of multiple statin intolerance  . Hypertension   . 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 Metastasis from malignant neoplasm of prostate ([place])    "to spine & left hip; dx'd 01/21/2017"  . Migraine    "none in the 2000s" (01/15/2017)  . Mitral valve prolapse ~ 1981  . Myocardial infarction [facility]) ~ 1993- 2005 X 3-4   (01/15/2017)  . Neuropathic pain of both feet 2015/2016   Vit B12 and A1c normal 10/2014  . Obesities, morbid ([place])   . On home oxygen therapy    "2L prn" (01/15/2017)  . OSA on CPAP    w/oxygen (01/15/2017)  .  Osteoarthrosis, unspecified whether generalized or localized, unspecified site    DDD  . PAF (paroxysmal atrial fibrillation) ([place]) 12/2016   Started in the context of resp illness/lots of bronchodilators.  Started eliquis and cardizem during hospitalization 01/2017 (Dr. Rayann Heman).  . Pre-diabetes 2016/17  . Prostate cancer metastatic to multiple sites [facility]) 09/2010; 2018   2012: Localized, high risk disease (Dr. Kimbrough/[place]):  s/p 5 wks ext bm rad + seed boost, as well as hormone blockade x 1 yr.  Biochemical recurrence 11/2015--urologist did bone scan and spots on T spine and L iliac region came up, pt needs plain films of pelvis/T spine to see if DJD correlate present.  Will be on lupron q28mo indefinitely and also will start zytiga in fall 2018.  . Radiation    Pelvic--for prostate ca: 25 treatments/01/2011  . Seizure disorder ([place]) 08-15-2011   Deja vu sensations: no w/u.  These stopped when neurontin 300mg  tid was started for a different reason.  . Status post chemotherapy    10/2010 thru 06/2011    Past Surgical History:  Procedure Laterality Date  . CARDIAC CATHETERIZATION  23 caths  . COLONOSCOPY WITH PROPOFOL N/A 04/05/2013   Dr. Fuller Plan.  Polypectomy (hyperplastic--recall 10 yrs).  Moderate diverticulosis, +internal hemorrhoids.  No radiation proctitis.  . CORONARY ANGIOPLASTY    . CORONARY  ANGIOPLASTY WITH STENT PLACEMENT     "5-6 stents" (01/15/2017)  . HERNIA REPAIR    . HOT HEMOSTASIS N/A 04/05/2013   Procedure: HOT HEMOSTASIS (ARGON PLASMA COAGULATION/BICAP);  Surgeon: Ladene Artist, MD;  Location: [facility];  Service: Endoscopy;  Laterality: N/A;  . INSERTION PROSTATE RADIATION SEED  02/24/2011  . TRANSTHORACIC ECHOCARDIOGRAM  12/2016   EF 55-60%, moderate LVH, grd I DD, mild LA dilation.  Marland Kitchen UMBILICAL HERNIA REPAIR      Outpatient Medications Prior to Visit  Medication Sig Dispense Refill  . abiraterone Acetate (ZYTIGA) 250 MG tablet Take 4 tablets (1,000 mg total) by  mouth daily. Take on an empty stomach 1 hour before or 2 hours after a meal 120 tablet 0  . acetaminophen (TYLENOL) 500 MG tablet Take 2 tablets (1,000 mg total) by mouth 3 (three) times daily. 30 tablet 0  . albuterol (PROAIR HFA) 108 (90 Base) MCG/ACT inhaler Inhale 2 puffs into the lungs every 6 (six) hours as needed for wheezing or shortness of breath.    Marland Kitchen albuterol (PROVENTIL) (2.5 MG/3ML) 0.083% nebulizer solution Take 3 mLs (2.5 mg total) by nebulization every 4 (four) hours as needed for wheezing or shortness of breath. 75 mL 3  . allopurinol (ZYLOPRIM) 300 MG tablet Take 1 tablet (300 mg total) by mouth daily. 90 tablet 3  . apixaban (ELIQUIS) 5 MG TABS tablet Take 1 tablet (5 mg total) by mouth 2 (two) times daily. 180 tablet 3  . bisoprolol-hydrochlorothiazide (ZIAC) 5-6.25 MG tablet Take 1 tablet by mouth every morning. 90 tablet 3  . budesonide-formoterol (SYMBICORT) 160-4.5 MCG/ACT inhaler Inhale 2 puffs into the lungs 2 (two) times daily. 3 Inhaler 3  . citalopram (CELEXA) 40 MG tablet Take 0.5 tablets (20 mg total) by mouth at bedtime. 90 tablet 3  . cyclobenzaprine (FLEXERIL) 10 MG tablet Take 1 tablet (10 mg total) by mouth at bedtime. 90 tablet 1  . dextromethorphan-guaiFENesin (MUCINEX DM) 30-600 MG 12hr tablet Take 1 tablet by mouth 2 (two) times daily as needed for cough.    . diltiazem (CARDIZEM) 30 MG tablet Take 1 tablet (30 mg total) by mouth every 12 (twelve) hours. 60 tablet 10  . feeding supplement, ENSURE ENLIVE, (ENSURE ENLIVE) LIQD Take 237 mLs by mouth 2 (two) times daily between meals. 237 mL 12  . furosemide (LASIX) 40 MG tablet Take 2 tablets (80 mg total) by mouth daily as needed. (Patient taking differently: Take 80 mg by mouth daily as needed for fluid. ) 180 tablet 1  . gabapentin (NEURONTIN) 300 MG capsule Take 1 capsule (300 mg total) by mouth 3 (three) times daily. 90 capsule 6  . Oxycodone HCl 10 MG TABS Take 1 tablet (10 mg total) by mouth every 6 (six)  hours as needed. 60 tablet 0  . predniSONE (DELTASONE) 10 MG tablet Prednisone 60 mg daily for 3 days followed by  Prednisone 40 mg daily for 3 days followed by  Prednisone 20 mg daily for 3 days followed by Prednisone 10 mg daily for 3 days. (Patient not taking: Reported on 02/12/2017) 39 tablet 0  . predniSONE (DELTASONE) 5 MG tablet Take 1 tablet (5 mg total) by mouth daily with breakfast. 30 tablet 3  . Probiotic Product (PROBIOTIC PO) Take 1 capsule by mouth daily.    . prochlorperazine (COMPAZINE) 10 MG tablet Take 1 tablet (10 mg total) by mouth every 6 (six) hours as needed for nausea or vomiting. 30 tablet 0  . traMADol (  ULTRAM) 50 MG tablet Take 1 tablet (50 mg total) by mouth every 6 (six) hours as needed. 30 tablet 1  . zolpidem (AMBIEN) 10 MG tablet Take 1 tablet (10 mg total) by mouth at bedtime as needed for sleep. 30 tablet 5   No facility-administered medications prior to visit.     Allergies  Allergen Reactions  . Amitriptyline Hcl Nausea Only    REACTION: nausea, elevated bp  . Ezetimibe Other (See Comments)    REACTION: chest pain, fatigue    ROS As per HPI  PE: There were no vitals taken for this visit. ***  LABS:    Chemistry      Component Value Date/Time   NA 141 03/25/2017 1504   K 4.4 03/25/2017 1504   CL 100 02/03/2017 1453   CO2 31 (H) 03/25/2017 1504   BUN 15.4 03/25/2017 1504   CREATININE 0.9 03/25/2017 1504      Component Value Date/Time   CALCIUM 9.7 03/25/2017 1504   ALKPHOS 140 03/25/2017 1504   AST 17 03/25/2017 1504   ALT 17 03/25/2017 1504   BILITOT 0.54 03/25/2017 1504     Lab Results  Component Value Date   WBC 11.7 (H) 03/25/2017   HGB 13.9 03/25/2017   HCT 42.5 03/25/2017   MCV 86.8 03/25/2017   PLT 276 03/25/2017   Lab Results  Component Value Date   CHOL 211 (H) 10/31/2016   HDL 37.30 (L) 10/31/2016   LDLCALC 146 (H) 10/31/2016   LDLDIRECT 130.7 12/02/2012   TRIG 137.0 10/31/2016   CHOLHDL 6 10/31/2016   Lab  Results  Component Value Date   PSA 6.15 (H) 11/15/2015   PSA 1.50 04/13/2014   PSA 4.91 (H) 08/23/2010   Lab Results  Component Value Date   HGBA1C 5.3 01/15/2017   Lab Results  Component Value Date   TSH 0.444 11/05/2016    IMPRESSION AND PLAN:  No problem-specific Assessment & Plan notes found for this encounter.   FOLLOW UP: No Follow-up on file.

## 2017-04-02 ENCOUNTER — Ambulatory Visit: Payer: Medicare Other

## 2017-04-02 ENCOUNTER — Ambulatory Visit (INDEPENDENT_AMBULATORY_CARE_PROVIDER_SITE_OTHER): Payer: Medicare Other | Admitting: Internal Medicine

## 2017-04-02 ENCOUNTER — Encounter: Payer: Self-pay | Admitting: Internal Medicine

## 2017-04-02 VITALS — BP 150/90 | HR 73 | Ht 70.0 in | Wt 273.4 lb

## 2017-04-02 DIAGNOSIS — I480 Paroxysmal atrial fibrillation: Secondary | ICD-10-CM | POA: Diagnosis not present

## 2017-04-02 DIAGNOSIS — I2510 Atherosclerotic heart disease of native coronary artery without angina pectoris: Secondary | ICD-10-CM | POA: Diagnosis not present

## 2017-04-02 DIAGNOSIS — E782 Mixed hyperlipidemia: Secondary | ICD-10-CM | POA: Diagnosis not present

## 2017-04-02 DIAGNOSIS — I10 Essential (primary) hypertension: Secondary | ICD-10-CM | POA: Diagnosis not present

## 2017-04-02 MED ORDER — DILTIAZEM HCL 30 MG PO TABS: 30.0000 mg | ORAL_TABLET | Freq: Two times a day (BID) | ORAL | 11 refills | Status: DC

## 2017-04-02 MED ORDER — BISOPROLOL-HYDROCHLOROTHIAZIDE 5-6.25 MG PO TABS: 1.0000 | ORAL_TABLET | Freq: Every morning | ORAL | 3 refills | Status: DC

## 2017-04-02 NOTE — Patient Instructions (Signed)
Your physician recommends that you continue on your current medications as directed. Please refer to the Current Medication list given to you today.  Please schedule an appointment with the [facility] Pharmacist to discuss starting [place].    Your physician wants you to follow-up in: February, 2019 with Dr. Harrington Challenger.  You will receive a reminder letter in the mail two months in advance. If you don't receive a letter, please call our office to schedule the follow-up appointment.

## 2017-04-02 NOTE — Progress Notes (Signed)
Cardiology Office Note   Date:  04/05/2017   ID:  Scott Rollins, Scott Rollins [birth year], MRN [id number]  PCP:  [name], MD  Cardiologist:   Dorris Carnes, MD   F/U of HTN and CAD    History of Present Illness: Scott Rollins is a 66 y.o. male with a history of HTN, CAD and PAF   I saw the pt back in 2012 H has a histroy of CAD    S/P mult PCIs to the Lcx and RCA.  Last cath in 2008 wshowed :VEF of 60%.  RCA with patent prox and mid stent.  Gap between stents showed 50 to 60% stenosis.  LM normal.  Subbranch of Dz had 90% narroiwing and 70% distal.  LCx had patent stent  Last myoview in 2009 was without ischemia In aprila he had pneumonia and MAT  This improved with Rx  Seen at outside [place] in May  Found to be atirial fib He a seen by Clearnce Hasten in June  Recom lifestyle modification. Given noncompliance did nto advise anticag unless sure of diagnosis of sustained afib and also f/u   Echo ordered  LVEF normal   Admitted in July with pneumonia  Seen by cardiology  Tele with MAT and PAF  EP saw  Recomm Rx underlying lung dz  Recomm anticoag if compliant  Since seen he denies CP  Denies palpitations  No dizziness    Current Meds  Medication Sig  . abiraterone Acetate (ZYTIGA) 250 MG tablet Take 4 tablets (1,000 mg total) by mouth daily. Take on an empty stomach 1 hour before or 2 hours after a meal  . acetaminophen (TYLENOL) 500 MG tablet Take 2 tablets (1,000 mg total) by mouth 3 (three) times daily.  Marland Kitchen albuterol (PROAIR HFA) 108 (90 Base) MCG/ACT inhaler Inhale 2 puffs into the lungs every 6 (six) hours as needed for wheezing or shortness of breath.  Marland Kitchen albuterol (PROVENTIL) (2.5 MG/3ML) 0.083% nebulizer solution Take 3 mLs (2.5 mg total) by nebulization every 4 (four) hours as needed for wheezing or shortness of breath.  . allopurinol (ZYLOPRIM) 300 MG tablet Take 1 tablet (300 mg total) by mouth daily.  Marland Kitchen apixaban (ELIQUIS) 5 MG TABS tablet Take 1 tablet (5 mg total) by mouth 2  (two) times daily.  . bisoprolol-hydrochlorothiazide (ZIAC) 5-6.25 MG tablet Take 1 tablet by mouth every morning.  . budesonide-formoterol (SYMBICORT) 160-4.5 MCG/ACT inhaler Inhale 2 puffs into the lungs 2 (two) times daily.  . citalopram (CELEXA) 40 MG tablet Take 0.5 tablets (20 mg total) by mouth at bedtime.  . cyclobenzaprine (FLEXERIL) 10 MG tablet Take 1 tablet (10 mg total) by mouth at bedtime.  Marland Kitchen dextromethorphan-guaiFENesin (MUCINEX DM) 30-600 MG 12hr tablet Take 1 tablet by mouth 2 (two) times daily as needed for cough.  . diltiazem (CARDIZEM) 30 MG tablet Take 1 tablet (30 mg total) by mouth every 12 (twelve) hours.  . feeding supplement, ENSURE ENLIVE, (ENSURE ENLIVE) LIQD Take 237 mLs by mouth 2 (two) times daily between meals.  . furosemide (LASIX) 40 MG tablet Take 2 tablets (80 mg total) by mouth daily as needed. (Patient taking differently: Take 80 mg by mouth daily as needed for fluid. )  . gabapentin (NEURONTIN) 300 MG capsule Take 1 capsule (300 mg total) by mouth 3 (three) times daily.  . Oxycodone HCl 10 MG TABS Take 1 tablet (10 mg total) by mouth every 6 (six) hours as needed.  . predniSONE (DELTASONE) 5  MG tablet Take 1 tablet (5 mg total) by mouth daily with breakfast.  . Probiotic Product (PROBIOTIC PO) Take 1 capsule by mouth daily.  . prochlorperazine (COMPAZINE) 10 MG tablet Take 1 tablet (10 mg total) by mouth every 6 (six) hours as needed for nausea or vomiting.  . traMADol (ULTRAM) 50 MG tablet Take 1 tablet (50 mg total) by mouth every 6 (six) hours as needed.  . zolpidem (AMBIEN) 10 MG tablet Take 1 tablet (10 mg total) by mouth at bedtime as needed for sleep.  . [DISCONTINUED] bisoprolol-hydrochlorothiazide (ZIAC) 5-6.25 MG tablet Take 1 tablet by mouth every morning.  . [DISCONTINUED] diltiazem (CARDIZEM) 30 MG tablet Take 1 tablet (30 mg total) by mouth every 12 (twelve) hours.     Allergies:   Amitriptyline hcl and Ezetimibe   Past Medical History:    Diagnosis Date  . Anginal pain ([place])   . Anxiety   . Arthritis    "hips, knees, thoracic back" (01/22/2017)  . CAP (community acquired pneumonia) 11/2016  . 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 COPD (chronic obstructive pulmonary disease) ([place])    "pearl study" pt thinks he is on Symbicort; pt noncompliant with COPD controller meds on/off due to financial reasons.  . Coronary atherosclerosis of unspecified type of vessel, native or graft 2005   MI, s/p PCI with stent (pt reports 20+ interventions in the past, most recent cat 6/08 showed patent stents).  Normal LV function.  Nuclear stress test NEG 8/09.  . Depression    ?bipolar dx by psychiatrist?  . Diverticulosis 2006  . ED (erectile dysfunction)   . Gout   . Gouty arthritis    Dr. Amil Amen  . Heart murmur   . Hematochezia 09/2012   Hyperplastic polyp, diverticulosis, and internal hemorrhoids found on colonoscopy 10/2012  . History of hiatal hernia   . Hyperkalemia 01/29/2017   Kayexalate 30 given x 1.  . Hyperlipidemia    Hx of multiple statin intolerance  . Hypertension   . 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 Migraine    "none in the 2000s" (01/15/2017)  . Mitral valve prolapse ~ 1981  . Myocardial infarction [facility]) ~ 1993- 2005 X 3-4   (01/15/2017)  . Neuropathic pain of both feet 2015/2016   Vit B12 and A1c normal 10/2014  . Obesities, morbid ([place])   . On home oxygen therapy    "2L prn" (01/15/2017)  . OSA on CPAP    w/oxygen (01/15/2017)  . Osteoarthrosis, unspecified whether generalized or localized, unspecified site    DDD  . PAF (paroxysmal atrial fibrillation) ([place]) 12/2016   Started in the context of resp illness/lots of bronchodilators.  Started eliquis and cardizem during hospitalization 01/2017 (Dr. Rayann Heman).  . Pre-diabetes 2016/17  . Prostate cancer metastatic to  multiple sites [facility]) 09/2010; 2018   2012: Localized, high risk disease (Dr. Kimbrough/Grapey):  s/p 5 wks ext bm rad + seed boost, as well as hormone blockade x 1 yr.  Biochemical recurrence 11/2015--urologist did bone scan and spots on T spine and L iliac region came up, pt needs plain films of pelvis/T spine to see if DJD correlate present.  Will be on lupron q46mo indefinitely and also will start zytiga in fall 2018.  . Radiation    Pelvic--for prostate ca: 25 treatments/01/2011  .  Seizure disorder ([place]) 08-15-2011   Deja vu sensations: no w/u.  These stopped when neurontin 300mg  tid was started for a different reason.  . Status post chemotherapy    10/2010 thru 06/2011    Past Surgical History:  Procedure Laterality Date  . CARDIAC CATHETERIZATION  23 caths  . COLONOSCOPY WITH PROPOFOL N/A 04/05/2013   Dr. Fuller Plan.  Polypectomy (hyperplastic--recall 10 yrs).  Moderate diverticulosis, +internal hemorrhoids.  No radiation proctitis.  . CORONARY ANGIOPLASTY    . CORONARY ANGIOPLASTY WITH STENT PLACEMENT     "5-6 stents" (01/15/2017)  . HERNIA REPAIR    . HOT HEMOSTASIS N/A 04/05/2013   Procedure: HOT HEMOSTASIS (ARGON PLASMA COAGULATION/BICAP);  Surgeon: Ladene Artist, MD;  Location: [facility];  Service: Endoscopy;  Laterality: N/A;  . INSERTION PROSTATE RADIATION SEED  02/24/2011  . TRANSTHORACIC ECHOCARDIOGRAM  12/2016   EF 55-60%, moderate LVH, grd I DD, mild LA dilation.  Marland Kitchen UMBILICAL HERNIA REPAIR       Social History:  The patient  reports that he quit smoking about 3 months ago. His smoking use included Cigarettes. He has a 54.00 pack-year smoking history. He has never used smokeless tobacco. He reports that he drinks alcohol. He reports that he uses drugs, including Marijuana.   Family History:  The patient's family history includes Arthritis in his mother; Bipolar disorder in his father; Heart disease in his father.    ROS:  Please see the history of present illness. All other  systems are reviewed and  Negative to the above problem except as noted.    PHYSICAL EXAM: VS:  BP (!) 150/90   Pulse 73   Ht 5\' 10"  (1.778 m)   Wt 273 lb 6.4 oz (124 kg)   BMI 39.23 kg/m   GEN: Well nourished, well developed, in no acute distress  HEENT: normal  Neck: no JVD, carotid bruits, or masses Cardiac: RRR; no murmurs, rubs, or gallops,no edema  Respiratory:  clear to auscultation bilaterally, normal work of breathing GI: soft, nontender, nondistended, + BS  No hepatomegaly  MS: no deformity Moving all extremities   Skin: warm and dry, no rash Neuro:  Strength and sensation are intact Psych: euthymic mood, full affect   EKG:  EKG is not ordered today.   Lipid Panel    Component Value Date/Time   CHOL 211 (H) 10/31/2016 0934   TRIG 137.0 10/31/2016 0934   HDL 37.30 (L) 10/31/2016 0934   CHOLHDL 6 10/31/2016 0934   VLDL 27.4 10/31/2016 0934   LDLCALC 146 (H) 10/31/2016 0934   LDLDIRECT 130.7 12/02/2012 0916      Wt Readings from Last 3 Encounters:  04/02/17 273 lb 6.4 oz (124 kg)  03/25/17 278 lb 3.2 oz (126.2 kg)  02/09/17 274 lb 3.2 oz (124.4 kg)      ASSESSMENT AND PLAN: 1  CAD  No symptoms of angina  LVEF normal on recent echo  Continue meds   2  Rhythm  Currently on apixiban  Hx PAF and MAT  No sustained atrial fib documented  Continue dilt and b blocker  Needs compliance with wt loss, CPAP,   3  HL  Will see about repathat coverage for pt    4  Pulm  Signif dz    F/U in clinic later this winter      Current medicines are reviewed at length with the patient today.  The patient does not have concerns regarding medicines.  Signed, Dorris Carnes, MD  04/05/2017  [facility] [address], [place], [place]  [address] Phone: [phone]; Fax: [phone]

## 2017-04-06 ENCOUNTER — Other Ambulatory Visit: Payer: Self-pay

## 2017-04-06 NOTE — Patient Outreach (Addendum)
[place] [facility]) Care Management  04/06/2017  EMBER GOTTWALD 1951/02/16 [id number]   Subjective: "I am doing good." Client states, he has "a little cold but it is clearing up now".   Objective: none  Assessment: 66 yr old with 3 admissions in 6 months. 4/24-4/27 for COPD with acute exacerbation; 7/5-7/7 for COPD exacerbation and atrial fibrillation; 7/11-7/15 for acute on chronic respiratory failure. History of HTN, CAD, Heart failure, tobacco abuse, prostate cancer with bone metastasis.   RNCM called to follow up and assess for any additional care management needs.  Client reports is doing well and that he has all his medications. He reports he has followup up with his cardiologist. Mr. Sigg states he has to reschedule his appointment with his pulmonologist and will do it within the next week.   Client denies any care management needs at this time. RNCM discussed health coach for continued COPD education/reinforcement. Client is agreeable.  Client request information about organ donation-RNCM will mail information from organ donor.gov site: Sharing the Gift of life; The Gift of life for families everywhere; Any age is the right age. RNCM will also mail EMMI handouts mailed: "Coping with a Health condition; Breathing Exercises; Preventing fall  Plan: Transition client to Health Coach for disease management.   [name], RN, MSN, [place] Coordinator Cell: [phone]

## 2017-04-06 NOTE — Patient Outreach (Addendum)
[place] [facility]) Care Management  04/06/2017  LAMOINE MAGALLON [birth year] [id number]  1st telephone call to patient for telephone assessment  No answer line busy.  Unable to leave message.    Plan:  RN Health Coach will attempt to reach the patient in 1 business day.  Lazaro Arms RN, BSN, [place] Direct Dial:  [phone] Fax: [phone]

## 2017-04-07 ENCOUNTER — Other Ambulatory Visit: Payer: Self-pay

## 2017-04-07 NOTE — Patient Outreach (Signed)
[facility]) Care Management  04/07/2017  Scott Rollins April 26, 1951 [id number]   [name] spoke with the patient and HIPAA verified. RN Health Coach called to introduce my self to the patient.  He was very Patent attorney of our service.  The patient states that he is doing ok but his COPD has started a little bit and that is his fault.  He has continued to smoke some but not as much as before.  He knows that it is not good for his health and has expressed that he would like to work on quitting. RN Health Coach will send a welcome letter to the patient.   Assessment: Patient will benefit from health coach outreach for disease management and support.  Plan: RN Health Coach will contact patient in the month of October for the initial assessment and the patient agrees to next outreach.    Lazaro Arms RN, BSN, [place] Direct Dial:  [phone] Fax: [phone]

## 2017-04-14 ENCOUNTER — Encounter: Payer: Self-pay | Admitting: Family Medicine

## 2017-04-16 ENCOUNTER — Encounter: Payer: Self-pay | Admitting: Family Medicine

## 2017-04-16 ENCOUNTER — Ambulatory Visit (INDEPENDENT_AMBULATORY_CARE_PROVIDER_SITE_OTHER): Payer: Medicare Other | Admitting: Family Medicine

## 2017-04-16 ENCOUNTER — Ambulatory Visit: Payer: Medicare Other

## 2017-04-16 VITALS — BP 151/87 | HR 70 | Temp 98.2°F | Resp 18 | Ht 70.0 in | Wt 277.0 lb

## 2017-04-16 DIAGNOSIS — Z23 Encounter for immunization: Secondary | ICD-10-CM

## 2017-04-16 DIAGNOSIS — I10 Essential (primary) hypertension: Secondary | ICD-10-CM | POA: Diagnosis not present

## 2017-04-16 DIAGNOSIS — J9611 Chronic respiratory failure with hypoxia: Secondary | ICD-10-CM | POA: Diagnosis not present

## 2017-04-16 DIAGNOSIS — Z0000 Encounter for general adult medical examination without abnormal findings: Secondary | ICD-10-CM

## 2017-04-16 DIAGNOSIS — J449 Chronic obstructive pulmonary disease, unspecified: Secondary | ICD-10-CM

## 2017-04-16 DIAGNOSIS — I2510 Atherosclerotic heart disease of native coronary artery without angina pectoris: Secondary | ICD-10-CM

## 2017-04-16 DIAGNOSIS — K219 Gastro-esophageal reflux disease without esophagitis: Secondary | ICD-10-CM

## 2017-04-16 DIAGNOSIS — C61 Malignant neoplasm of prostate: Secondary | ICD-10-CM

## 2017-04-16 MED ORDER — HYDROCHLOROTHIAZIDE 25 MG PO TABS: 25.0000 mg | ORAL_TABLET | Freq: Every day | ORAL | 6 refills | Status: DC

## 2017-04-16 MED ORDER — ZOSTER VAC RECOMB ADJUVANTED 50 MCG/0.5ML IM SUSR: 0.5000 mL | Freq: Once | INTRAMUSCULAR | 1 refills | Status: AC

## 2017-04-16 MED ORDER — PANTOPRAZOLE SODIUM 40 MG PO TBEC: 40.0000 mg | DELAYED_RELEASE_TABLET | Freq: Every day | ORAL | 6 refills | Status: DC

## 2017-04-16 NOTE — Progress Notes (Signed)
AWV reviewed and agree.  Signed:  Crissie Sickles, MD           04/16/2017

## 2017-04-16 NOTE — Progress Notes (Signed)
Subjective:   Scott Rollins is a 66 y.o. male who presents for an Initial Medicare Annual Wellness Visit.  Review of Systems  No ROS.  Medicare Wellness Visit. Additional risk factors are reflected in the social history.  Cardiac Risk Factors include: advanced age (>27men, >46 women);dyslipidemia;family history of premature cardiovascular disease;hypertension;male gender;obesity (BMI >30kg/m2);sedentary lifestyle;smoking/ tobacco exposure   Sleep patterns: Sleeps about 8 hours, has CPAP but does not use.  Home Safety/Smoke Alarms: Feels safe in home. Smoke alarms in place.  Living environment; residence and Firearm Safety: Lives with wife.  Seat Belt Safety/Bike Helmet: Wears seat belt.   Male:   CCS-Colonoscopy 04/05/2013, polyp. Recall 10 years.     PSA-  Lab Results  Component Value Date   PSA 6.15 (H) 11/15/2015   PSA 1.50 04/13/2014   PSA 4.91 (H) 08/23/2010      Objective:    Today's Vitals   04/16/17 1321  BP: (!) 151/87  Pulse: 70  Resp: 18  Temp: 98.2 F (36.8 C)  TempSrc: Oral  SpO2: 94%  Weight: 277 lb (125.6 kg)  Height: 5\' 10"  (1.778 m)  PainSc: 2    Body mass index is 39.75 kg/m.  Current Medications (verified) Outpatient Encounter Prescriptions as of 04/16/2017  Medication Sig  . abiraterone Acetate (ZYTIGA) 250 MG tablet Take 4 tablets (1,000 mg total) by mouth daily. Take on an empty stomach 1 hour before or 2 hours after a meal  . acetaminophen (TYLENOL) 500 MG tablet Take 2 tablets (1,000 mg total) by mouth 3 (three) times daily.  Marland Kitchen albuterol (PROAIR HFA) 108 (90 Base) MCG/ACT inhaler Inhale 2 puffs into the lungs every 6 (six) hours as needed for wheezing or shortness of breath.  Marland Kitchen albuterol (PROVENTIL) (2.5 MG/3ML) 0.083% nebulizer solution Take 3 mLs (2.5 mg total) by nebulization every 4 (four) hours as needed for wheezing or shortness of breath.  . allopurinol (ZYLOPRIM) 300 MG tablet Take 1 tablet (300 mg total) by mouth daily.  Marland Kitchen  apixaban (ELIQUIS) 5 MG TABS tablet Take 1 tablet (5 mg total) by mouth 2 (two) times daily.  . bisoprolol-hydrochlorothiazide (ZIAC) 5-6.25 MG tablet Take 1 tablet by mouth every morning.  . budesonide-formoterol (SYMBICORT) 160-4.5 MCG/ACT inhaler Inhale 2 puffs into the lungs 2 (two) times daily.  . citalopram (CELEXA) 40 MG tablet Take 0.5 tablets (20 mg total) by mouth at bedtime.  . cyclobenzaprine (FLEXERIL) 10 MG tablet Take 1 tablet (10 mg total) by mouth at bedtime.  Marland Kitchen dextromethorphan-guaiFENesin (MUCINEX DM) 30-600 MG 12hr tablet Take 1 tablet by mouth 2 (two) times daily as needed for cough.  . diltiazem (CARDIZEM) 30 MG tablet Take 1 tablet (30 mg total) by mouth every 12 (twelve) hours.  . feeding supplement, ENSURE ENLIVE, (ENSURE ENLIVE) LIQD Take 237 mLs by mouth 2 (two) times daily between meals.  . furosemide (LASIX) 40 MG tablet Take 2 tablets (80 mg total) by mouth daily as needed. (Patient taking differently: Take 80 mg by mouth daily as needed for fluid. )  . gabapentin (NEURONTIN) 300 MG capsule Take 1 capsule (300 mg total) by mouth 3 (three) times daily.  . Oxycodone HCl 10 MG TABS Take 1 tablet (10 mg total) by mouth every 6 (six) hours as needed.  . predniSONE (DELTASONE) 5 MG tablet Take 1 tablet (5 mg total) by mouth daily with breakfast.  . Probiotic Product (PROBIOTIC PO) Take 1 capsule by mouth daily.  . prochlorperazine (COMPAZINE) 10 MG tablet Take  1 tablet (10 mg total) by mouth every 6 (six) hours as needed for nausea or vomiting.  . traMADol (ULTRAM) 50 MG tablet Take 1 tablet (50 mg total) by mouth every 6 (six) hours as needed.  . zolpidem (AMBIEN) 10 MG tablet Take 1 tablet (10 mg total) by mouth at bedtime as needed for sleep.  Marland Kitchen Zoster Vac Recomb Adjuvanted [facility]) injection Inject 0.5 mLs into the muscle once.   No facility-administered encounter medications on file as of 04/16/2017.     Allergies (verified) Amitriptyline hcl and Ezetimibe    History: Past Medical History:  Diagnosis Date  . Anginal pain ([place])   . Anxiety   . Arthritis    "hips, knees, thoracic back" (01/22/2017)  . CAP (community acquired pneumonia) 11/2016  . 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 COPD (chronic obstructive pulmonary disease) ([place])    "pearl study" pt thinks he is on Symbicort; pt noncompliant with COPD controller meds on/off due to financial reasons.  . Coronary atherosclerosis of unspecified type of vessel, native or graft 2005   MI, s/p PCI with stent (pt reports 20+ interventions in the past, most recent cat 6/08 showed patent stents).  Normal LV function.  Nuclear stress test NEG 8/09.  . Depression    ?bipolar dx by psychiatrist?  . Diverticulosis 2006  . ED (erectile dysfunction)   . Gout   . Gouty arthritis    Dr. Amil Amen  . Heart murmur   . Hematochezia 09/2012   Hyperplastic polyp, diverticulosis, and internal hemorrhoids found on colonoscopy 10/2012  . History of hiatal hernia   . Hyperkalemia 01/29/2017   Kayexalate 30 given x 1.  . Hyperlipidemia    Hx of multiple statin intolerance  . Hypertension   . 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 Migraine    "none in the 2000s" (01/15/2017)  . Mitral valve prolapse ~ 1981  . Myocardial infarction [facility]) ~ 1993- 2005 X 3-4   (01/15/2017)  . Neuropathic pain of both feet 2015/2016   Vit B12 and A1c normal 10/2014  . Obesities, morbid ([place])   . On home oxygen therapy    "2L prn" (01/15/2017)  . OSA on CPAP    w/oxygen (01/15/2017)  . Osteoarthrosis, unspecified whether generalized or localized, unspecified site    DDD  . PAF (paroxysmal atrial fibrillation) ([place]) 12/2016   Started in the context of resp illness/lots of bronchodilators.  Started eliquis and cardizem during hospitalization 01/2017 (Dr. Rayann Heman).  . Pre-diabetes 2016/17   . Prostate cancer metastatic to multiple sites [facility]) 09/2010; 2018   2012: Localized, high risk disease (Dr. Kimbrough/Grapey):  s/p 5 wks ext bm rad + seed boost, as well as hormone blockade x 1 yr.  Biochemical recurrence 11/2015--imaging showed bone mets and lymph node involvement.  Lupron and zytiga started in fall 2018.  . Radiation    Pelvic--for prostate ca: 25 treatments/01/2011  . Seizure disorder ([place]) 08-15-2011   Deja vu sensations: no w/u.  These stopped when neurontin 300mg  tid was started for a different reason.  . Status post chemotherapy    10/2010 thru 06/2011   Past Surgical History:  Procedure Laterality Date  . CARDIAC CATHETERIZATION  23 caths  . COLONOSCOPY WITH PROPOFOL N/A 04/05/2013   Dr. Fuller Plan.  Polypectomy (hyperplastic--recall 10 yrs).  Moderate diverticulosis, +  internal hemorrhoids.  No radiation proctitis.  . CORONARY ANGIOPLASTY    . CORONARY ANGIOPLASTY WITH STENT PLACEMENT     "5-6 stents" (01/15/2017)  . HERNIA REPAIR    . HOT HEMOSTASIS N/A 04/05/2013   Procedure: HOT HEMOSTASIS (ARGON PLASMA COAGULATION/BICAP);  Surgeon: Ladene Artist, MD;  Location: [facility];  Service: Endoscopy;  Laterality: N/A;  . INSERTION PROSTATE RADIATION SEED  02/24/2011  . TRANSTHORACIC ECHOCARDIOGRAM  12/2016   EF 55-60%, moderate LVH, grd I DD, mild LA dilation.  Marland Kitchen UMBILICAL HERNIA REPAIR     Family History  Problem Relation Age of Onset  . Arthritis Mother   . Heart disease Father   . Bipolar disorder Father    Social History   Occupational History  . [employer]   Social History Main Topics  . Smoking status: Former Smoker    Packs/day: 1.00    Years: 54.00    Types: Cigarettes    Quit date: 12/31/2016  . Smokeless tobacco: Never Used  . Alcohol use Yes     Comment: 01/22/2017 "once q 2-3 months; a beer or glass of wine"  . Drug use: Yes    Types: Marijuana     Comment: 01/22/2017 "nothing in the last several weeks"  . Sexual activity: No    Tobacco Counseling Counseling given: No   Activities of Daily Living In your present state of health, do you have any difficulty performing the following activities: 04/16/2017 02/12/2017  Hearing? N N  Vision? N N  Difficulty concentrating or making decisions? N N  Walking or climbing stairs? Y N  Comment pain, unstable, and breathing just being short of breath, but able to walk and climb shorter stairs  Dressing or bathing? N N  Doing errands, shopping? N N  Preparing Food and eating ? N N  Using the Toilet? N N  In the past six months, have you accidently leaked urine? N Y  Comment - a little incontinence due to prostate cancer wears pads.  Do you have problems with loss of bowel control? N N  Managing your Medications? N N  Managing your Finances? N N  Housekeeping or managing your Housekeeping? N N  Some recent data might be hidden    Immunizations and Health Maintenance Immunization History  Administered Date(s) Administered  . Influenza Split 08/21/2011, 04/29/2012  . Influenza Whole 04/16/2009, 08/21/2010  . Influenza, High Dose Seasonal PF 03/26/2016  . Influenza,inj,Quad PF,6+ Mos 04/22/2013, 04/22/2013, 04/13/2014  . Pneumococcal Conjugate-13 11/13/2015  . Pneumococcal Polysaccharide-23 02/12/2004, 08/21/2011, 11/07/2016  . Td 07/15/2007  . Zoster 12/09/2012   Health Maintenance Due  Topic Date Due  . INFLUENZA VACCINE  02/11/2017    Patient Care Team: [name], MD as PCP - General Rana Snare, MD as Consulting Physician (Urology) Hennie Duos, MD as Consulting Physician (Rheumatology) Ladene Artist, MD as Consulting Physician (Gastroenterology) [name], [facility] as [place] Management (Pharmacist) Thompson Grayer, MD as Consulting Physician (Cardiology) Wyatt Portela, MD as Consulting Physician (Oncology) Fay Records, MD as Consulting Physician (Cardiology) Tanda Rockers, MD as Consulting Physician (Pulmonary  Disease) Lazaro Arms, RN as [place] any recent Medical Services you may have received from other than [facility] providers in the past year (date may be approximate).    Assessment:   This is a routine wellness examination for [place]. Physical assessment deferred to PCP.   Hearing/Vision screen Hearing Screening  Comments: Able to hear conversational tones w/o difficulty. No issues reported.   Vision Screening Comments: Last exam 07/14/2013, wears glasses.   Dietary issues and exercise activities discussed: Current Exercise Habits: The patient does not participate in regular exercise at present, Exercise limited by: orthopedic condition(s);respiratory conditions(s)   Diet (meal preparation, eat out, water intake, caffeinated beverages, dairy products, fruits and vegetables): Drinks water.   Patient states he eats fairly healthy diet including fruits and vegetables.       Goals    . patient          Increase activity.       Depression Screen PHQ 2/9 Scores 04/16/2017 02/12/2017 11/13/2015  PHQ - 2 Score 2 0 3  PHQ- 9 Score 5 - 6    Fall Risk Fall Risk  04/16/2017 02/12/2017 11/13/2015  Falls in the past year? No No No  Risk for fall due to : - (No Data) -  Risk for fall due to: Comment - "i have not fallen but i have been unsteady on my feet" -    Cognitive Function: MMSE - Mini Mental State Exam 04/16/2017  Orientation to time 5  Orientation to Place 5  Registration 3  Attention/ Calculation 5  Recall 3  Language- name 2 objects 2  Language- repeat 1  Language- follow 3 step command 3  Language- read & follow direction 1  Write a sentence 1  Copy design 1  Total score 30        Screening Tests Health Maintenance  Topic Date Due  . INFLUENZA VACCINE  02/11/2017  . TETANUS/TDAP  07/14/2017  . COLONOSCOPY  04/06/2023  . Hepatitis C Screening  Completed  . PNA vac Low Risk Adult  Completed        Plan:     Shingles vaccine  at [facility].   Bring a copy of your living will and/or healthcare power of attorney to your next office visit.  Continue doing brain stimulating activities (puzzles, reading, adult coloring books, staying active) to keep memory sharp.   I have personally reviewed and noted the following in the patient's chart:   . Medical and social history . Use of alcohol, tobacco or illicit drugs  . Current medications and supplements . Functional ability and status . Nutritional status . Physical activity . Advanced directives . List of other physicians . Hospitalizations, surgeries, and ER visits in previous 12 months . Vitals . Screenings to include cognitive, depression, and falls . Referrals and appointments  In addition, I have reviewed and discussed with patient certain preventive protocols, quality metrics, and best practice recommendations. A written personalized care plan for preventive services as well as general preventive health recommendations were provided to patient.     Gerilyn Nestle, RN   04/16/2017

## 2017-04-16 NOTE — Progress Notes (Signed)
OFFICE VISIT  04/16/2017   CC:  Chief Complaint  Patient presents with  . Medicare Wellness  F/u chronic illnesses  HPI:    Patient is a 66 y.o. Caucasian male who presents for follow up COPD, metastatic prostate cancer, HTN. Hospitalized 01/21/17 for 4 days due to COPD exacerbation/acute on chronic hypoxic resp failure. He also has been followed recently by electrophys for a-fib--eliquis treatment.  "I'm doing okay".    HTN: Home bp's : 150s/80, HR 80s. He asks to restart hctz 25mg  qd b/c he felt like it helped his bp the best. He takes 6.25 mg hctz already as part of his ziac.  He takes his lasix approx 2 times per month for peripheral edema.  COPD: breathing feeling good at this time.  No signif cough.  He takes albuterol several times per day as per his usual. He is not on home oxygen regularly--only intermittently.  He does not have a pulse-ox at home. Having no CP, palpitations, or racing heart.  GERD: has had this in significant amount all his life, worse lately esp with addition of more meds lately esp Zytiga. Worse when he lays down.  Burps a lot, can tastes water brash.  NO ST.  Has raspy voice.  Taking only tums right now.  Prostate ca: on zytiga, taking oxycodone regularly and trying to use tramadol for milder pain.  Feels less sedation side effect with the recent addition of tramadol into the regimen.  Location of pain is L femur and L pelvis/hip and lower and upper spine region, R clavicle.  ROS: some constipation from oxycodone.  No melena, hematochezia, nose bleeds, or hemoptysis or excessive bruising.  Past Medical History:  Diagnosis Date  . Anginal pain ([place])   . Anxiety   . Arthritis    "hips, knees, thoracic back" (01/22/2017)  . CAP (community acquired pneumonia) 11/2016  . 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 COPD (chronic obstructive pulmonary disease) ([place])    "pearl study" pt thinks he is on Symbicort; pt  noncompliant with COPD controller meds on/off due to financial reasons.  . Coronary atherosclerosis of unspecified type of vessel, native or graft 2005   MI, s/p PCI with stent (pt reports 20+ interventions in the past, most recent cat 6/08 showed patent stents).  Normal LV function.  Nuclear stress test NEG 8/09.  . Depression    ?bipolar dx by psychiatrist?  . Diverticulosis 2006  . ED (erectile dysfunction)   . Gout   . Gouty arthritis    Dr. Amil Amen  . Heart murmur   . Hematochezia 09/2012   Hyperplastic polyp, diverticulosis, and internal hemorrhoids found on colonoscopy 10/2012  . History of hiatal hernia   . Hyperkalemia 01/29/2017   Kayexalate 30 given x 1.  . Hyperlipidemia    Hx of multiple statin intolerance  . Hypertension   . 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 Migraine    "none in the 2000s" (01/15/2017)  . Mitral valve prolapse ~ 1981  . Myocardial infarction [facility]) ~ 1993- 2005 X 3-4   (01/15/2017)  . Neuropathic pain of both feet 2015/2016   Vit B12 and A1c normal 10/2014  . Obesities, morbid ([place])   . On home oxygen therapy    "2L prn" (01/15/2017)  . OSA on CPAP    w/oxygen (  01/15/2017)  . Osteoarthrosis, unspecified whether generalized or localized, unspecified site    DDD  . PAF (paroxysmal atrial fibrillation) ([place]) 12/2016   Started in the context of resp illness/lots of bronchodilators.  Started eliquis and cardizem during hospitalization 01/2017 (Dr. Rayann Heman).  . Pre-diabetes 2016/17  . Prostate cancer metastatic to multiple sites [facility]) 09/2010; 2018   2012: Localized, high risk disease (Dr. Kimbrough/Grapey):  s/p 5 wks ext bm rad + seed boost, as well as hormone blockade x 1 yr.  Biochemical recurrence 11/2015--imaging showed bone mets and lymph node involvement.  Lupron and zytiga started in fall 2018.  . Radiation    Pelvic--for prostate ca: 25  treatments/01/2011  . Seizure disorder ([place]) 08-15-2011   Deja vu sensations: no w/u.  These stopped when neurontin 300mg  tid was started for a different reason.  . Status post chemotherapy    10/2010 thru 06/2011    Past Surgical History:  Procedure Laterality Date  . CARDIAC CATHETERIZATION  23 caths  . COLONOSCOPY WITH PROPOFOL N/A 04/05/2013   Dr. Fuller Plan.  Polypectomy (hyperplastic--recall 10 yrs).  Moderate diverticulosis, +internal hemorrhoids.  No radiation proctitis.  . CORONARY ANGIOPLASTY    . CORONARY ANGIOPLASTY WITH STENT PLACEMENT     "5-6 stents" (01/15/2017)  . HERNIA REPAIR    . HOT HEMOSTASIS N/A 04/05/2013   Procedure: HOT HEMOSTASIS (ARGON PLASMA COAGULATION/BICAP);  Surgeon: Ladene Artist, MD;  [facility];  Service: Endoscopy;  Laterality: N/A;  . INSERTION PROSTATE RADIATION SEED  02/24/2011  . TRANSTHORACIC ECHOCARDIOGRAM  12/2016   EF 55-60%, moderate LVH, grd I DD, mild LA dilation.  Marland Kitchen UMBILICAL HERNIA REPAIR      Outpatient Medications Prior to Visit  Medication Sig Dispense Refill  . abiraterone Acetate (ZYTIGA) 250 MG tablet Take 4 tablets (1,000 mg total) by mouth daily. Take on an empty stomach 1 hour before or 2 hours after a meal 120 tablet 0  . acetaminophen (TYLENOL) 500 MG tablet Take 2 tablets (1,000 mg total) by mouth 3 (three) times daily. 30 tablet 0  . albuterol (PROAIR HFA) 108 (90 Base) MCG/ACT inhaler Inhale 2 puffs into the lungs every 6 (six) hours as needed for wheezing or shortness of breath.    Marland Kitchen albuterol (PROVENTIL) (2.5 MG/3ML) 0.083% nebulizer solution Take 3 mLs (2.5 mg total) by nebulization every 4 (four) hours as needed for wheezing or shortness of breath. 75 mL 3  . allopurinol (ZYLOPRIM) 300 MG tablet Take 1 tablet (300 mg total) by mouth daily. 90 tablet 3  . apixaban (ELIQUIS) 5 MG TABS tablet Take 1 tablet (5 mg total) by mouth 2 (two) times daily. 180 tablet 3  . bisoprolol-hydrochlorothiazide (ZIAC) 5-6.25 MG tablet  Take 1 tablet by mouth every morning. 90 tablet 3  . budesonide-formoterol (SYMBICORT) 160-4.5 MCG/ACT inhaler Inhale 2 puffs into the lungs 2 (two) times daily. 3 Inhaler 3  . citalopram (CELEXA) 40 MG tablet Take 0.5 tablets (20 mg total) by mouth at bedtime. 90 tablet 3  . cyclobenzaprine (FLEXERIL) 10 MG tablet Take 1 tablet (10 mg total) by mouth at bedtime. 90 tablet 1  . dextromethorphan-guaiFENesin (MUCINEX DM) 30-600 MG 12hr tablet Take 1 tablet by mouth 2 (two) times daily as needed for cough.    . diltiazem (CARDIZEM) 30 MG tablet Take 1 tablet (30 mg total) by mouth every 12 (twelve) hours. 60 tablet 11  . feeding supplement, ENSURE ENLIVE, (ENSURE ENLIVE) LIQD Take 237 mLs by mouth 2 (two) times daily  between meals. 237 mL 12  . furosemide (LASIX) 40 MG tablet Take 2 tablets (80 mg total) by mouth daily as needed. (Patient taking differently: Take 80 mg by mouth daily as needed for fluid. ) 180 tablet 1  . gabapentin (NEURONTIN) 300 MG capsule Take 1 capsule (300 mg total) by mouth 3 (three) times daily. 90 capsule 6  . Oxycodone HCl 10 MG TABS Take 1 tablet (10 mg total) by mouth every 6 (six) hours as needed. 60 tablet 0  . predniSONE (DELTASONE) 5 MG tablet Take 1 tablet (5 mg total) by mouth daily with breakfast. 30 tablet 3  . Probiotic Product (PROBIOTIC PO) Take 1 capsule by mouth daily.    . prochlorperazine (COMPAZINE) 10 MG tablet Take 1 tablet (10 mg total) by mouth every 6 (six) hours as needed for nausea or vomiting. 30 tablet 0  . traMADol (ULTRAM) 50 MG tablet Take 1 tablet (50 mg total) by mouth every 6 (six) hours as needed. 30 tablet 1  . zolpidem (AMBIEN) 10 MG tablet Take 1 tablet (10 mg total) by mouth at bedtime as needed for sleep. 30 tablet 5   No facility-administered medications prior to visit.     Allergies  Allergen Reactions  . Amitriptyline Hcl Nausea Only    REACTION: nausea, elevated bp  . Ezetimibe Other (See Comments)    REACTION: chest pain,  fatigue    ROS As per HPI  PE: Blood pressure (!) 151/87, pulse 70, temperature 98.2 F (36.8 C), temperature source Oral, resp. rate 18, height 5\' 10"  (1.778 m), weight 277 lb (125.6 kg), SpO2 94 %. Gen: Alert, well appearing.  Patient is oriented to person, place, time, and situation. AFFECT: pleasant, lucid thought and speech. CV: RRR, no m/r/g LUNGS: CTA on inspiration bilat but aeration diminished diffusely, with a bit more diminished BS on expiration + soft exp wheeze and significantly prolonged exp phase.  Nonlabored resps. EXT: L LL with 1+ pitting edema, R LL with no edema.  LABS:  Lab Results  Component Value Date   TSH 0.444 11/05/2016   Lab Results  Component Value Date   WBC 11.7 (H) 03/25/2017   HGB 13.9 03/25/2017   HCT 42.5 03/25/2017   MCV 86.8 03/25/2017   PLT 276 03/25/2017   Lab Results  Component Value Date   CREATININE 0.9 03/25/2017   BUN 15.4 03/25/2017   NA 141 03/25/2017   K 4.4 03/25/2017   CL 100 02/03/2017   CO2 31 (H) 03/25/2017   Lab Results  Component Value Date   ALT 17 03/25/2017   AST 17 03/25/2017   ALKPHOS 140 03/25/2017   BILITOT 0.54 03/25/2017   Lab Results  Component Value Date   CHOL 211 (H) 10/31/2016   Lab Results  Component Value Date   HDL 37.30 (L) 10/31/2016   Lab Results  Component Value Date   LDLCALC 146 (H) 10/31/2016   Lab Results  Component Value Date   TRIG 137.0 10/31/2016   Lab Results  Component Value Date   CHOLHDL 6 10/31/2016   Lab Results  Component Value Date   PSA 6.15 (H) 11/15/2015   PSA 1.50 04/13/2014   PSA 4.91 (H) 08/23/2010   Lab Results  Component Value Date   HGBA1C 5.3 01/15/2017    IMPRESSION AND PLAN:  1) HTN: not well controlled but not horrible.  Continue current meds and add hctz 25mg  qd.  2) COPD: stable.  Encouraged more use of oxygen at  home prn activity and sleep, also use portable concentrator when out in the community.  Continue current inhaler  regimen.  3) GERD: start pantoprazole 40mg  qd.  4) Metastatic prostate cancer: as per oncology and urology.  Zytiga and Lupron. Pain control with oxycodone and tramadol as per oncologist.  An After Visit Summary was printed and given to the patient.  FOLLOW UP: Return in about 4 weeks (around 05/14/2017) for routine chronic illness f/u 30 min).  Signed:  Crissie Sickles, MD           04/16/2017

## 2017-04-16 NOTE — Patient Instructions (Signed)
Shingles vaccine at [facility].   Bring a copy of your living will and/or healthcare power of attorney to your next office visit.  Continue doing brain stimulating activities (puzzles, reading, adult coloring books, staying active) to keep memory sharp.    Health Maintenance, Male A healthy lifestyle and preventive care is important for your health and wellness. Ask your health care provider about what schedule of regular examinations is right for you. What should I know about weight and diet? Eat a Healthy Diet  Eat plenty of vegetables, fruits, whole grains, low-fat dairy products, and lean protein.  Do not eat a lot of foods high in solid fats, added sugars, or salt.  Maintain a Healthy Weight Regular exercise can help you achieve or maintain a healthy weight. You should:  Do at least 150 minutes of exercise each week. The exercise should increase your heart rate and make you sweat (moderate-intensity exercise).  Do strength-training exercises at least twice a week.  Watch Your Levels of Cholesterol and Blood Lipids  Have your blood tested for lipids and cholesterol every 5 years starting at 67 years of age. If you are at high risk for heart disease, you should start having your blood tested when you are 67 years old. You may need to have your cholesterol levels checked more often if: ? Your lipid or cholesterol levels are high. ? You are older than 67 years of age. ? You are at high risk for heart disease.  What should I know about cancer screening? Many types of cancers can be detected early and may often be prevented. Lung Cancer  You should be screened every year for lung cancer if: ? You are a current smoker who has smoked for at least 30 years. ? You are a former smoker who has quit within the past 15 years.  Talk to your health care provider about your screening options, when you should start screening, and how often you should be screened.  Colorectal Cancer  Routine  colorectal cancer screening usually begins at 66 years of age and should be repeated every 5-10 years until you are 67 years old. You may need to be screened more often if early forms of precancerous polyps or small growths are found. Your health care provider may recommend screening at an earlier age if you have risk factors for colon cancer.  Your health care provider may recommend using home test kits to check for hidden blood in the stool.  A small camera at the end of a tube can be used to examine your colon (sigmoidoscopy or colonoscopy). This checks for the earliest forms of colorectal cancer.  Prostate and Testicular Cancer  Depending on your age and overall health, your health care provider may do certain tests to screen for prostate and testicular cancer.  Talk to your health care provider about any symptoms or concerns you have about testicular or prostate cancer.  Skin Cancer  Check your skin from head to toe regularly.  Tell your health care provider about any new moles or changes in moles, especially if: ? There is a change in a mole's size, shape, or color. ? You have a mole that is larger than a pencil eraser.  Always use sunscreen. Apply sunscreen liberally and repeat throughout the day.  Protect yourself by wearing long sleeves, pants, a wide-brimmed hat, and sunglasses when outside.  What should I know about heart disease, diabetes, and high blood pressure?  If you are 70-38 years of age,  have your blood pressure checked every 3-5 years. If you are 68 years of age or older, have your blood pressure checked every year. You should have your blood pressure measured twice-once when you are at a hospital or clinic, and once when you are not at a hospital or clinic. Record the average of the two measurements. To check your blood pressure when you are not at a hospital or clinic, you can use: ? An automated blood pressure machine at a pharmacy. ? A home blood pressure  monitor.  Talk to your health care provider about your target blood pressure.  If you are between 51-54 years old, ask your health care provider if you should take aspirin to prevent heart disease.  Have regular diabetes screenings by checking your fasting blood sugar level. ? If you are at a normal weight and have a low risk for diabetes, have this test once every three years after the age of 47. ? If you are overweight and have a high risk for diabetes, consider being tested at a younger age or more often.  A one-time screening for abdominal aortic aneurysm (AAA) by ultrasound is recommended for men aged 66-75 years who are current or former smokers. What should I know about preventing infection? Hepatitis B If you have a higher risk for hepatitis B, you should be screened for this virus. Talk with your health care provider to find out if you are at risk for hepatitis B infection. Hepatitis C Blood testing is recommended for:  Everyone born from [birth year] through 1966.  Anyone with known risk factors for hepatitis C.  Sexually Transmitted Diseases (STDs)  You should be screened each year for STDs including gonorrhea and chlamydia if: ? You are sexually active and are younger than 67 years of age. ? You are older than 67 years of age and your health care provider tells you that you are at risk for this type of infection. ? Your sexual activity has changed since you were last screened and you are at an increased risk for chlamydia or gonorrhea. Ask your health care provider if you are at risk.  Talk with your health care provider about whether you are at high risk of being infected with HIV. Your health care provider may recommend a prescription medicine to help prevent HIV infection.  What else can I do?  Schedule regular health, dental, and eye exams.  Stay current with your vaccines (immunizations).  Do not use any tobacco products, such as cigarettes, chewing tobacco, and  e-cigarettes. If you need help quitting, ask your health care provider.  Limit alcohol intake to no more than 2 drinks per day. One drink equals 12 ounces of beer, 5 ounces of wine, or 1 ounces of hard liquor.  Do not use street drugs.  Do not share needles.  Ask your health care provider for help if you need support or information about quitting drugs.  Tell your health care provider if you often feel depressed.  Tell your health care provider if you have ever been abused or do not feel safe at home. This information is not intended to replace advice given to you by your health care provider. Make sure you discuss any questions you have with your health care provider. Document Released: 12/27/2007 Document Revised: 02/27/2016 Document Reviewed: 04/03/2015 Elsevier Interactive Patient Education  [employer].

## 2017-04-22 NOTE — Progress Notes (Deleted)
Patient ID: Scott Rollins                 DOB: December 24, 1950                    MRN: [id number]     HPI: Scott Rollins is a 66 y.o. male patient referred to lipid clinic by [name]. PMH is significant for CAD s/p MI and multiple PCIs to the LCx and RCA, HLD, HTN, and PAF.  Sx with lova and atorva? On anything when labs drawn in April? Retry crestor 5 or 10 Can discuss pcsk9i or trial  Medicare insurance? Get card info  Current Medications: none Intolerances: Zetia - chest pain and fatigue, lovastatin 20mg  daily, atorvastatin 20mg  daily Risk Factors: CAD s/p MI and multiple PCIs, sex LDL goal: 70mg /dL  Diet:   Exercise:   Family History: The patient's family history includes Arthritis in his mother; Bipolar disorder in his father; Heart disease in his father.   Social History: The patient  reports that he quit smoking about 3 months ago. His smoking use included Cigarettes. He has a 54.00 pack-year smoking history. He has never used smokeless tobacco. He reports that he drinks alcohol. He reports that he uses drugs, including Marijuana.   Labs: 10/31/16: TC 211, HDL 37.3, TG 137, LDL 146  Past Medical History:  Diagnosis Date  . Anginal pain ([place])   . Anxiety   . Arthritis    "hips, knees, thoracic back" (01/22/2017)  . CAP (community acquired pneumonia) 11/2016  . 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 COPD (chronic obstructive pulmonary disease) ([place])    "pearl study" pt thinks he is on Symbicort; pt noncompliant with COPD controller meds on/off due to financial reasons.  . Coronary atherosclerosis of unspecified type of vessel, native or graft 2005   MI, s/p PCI with stent (pt reports 20+ interventions in the past, most recent cat 6/08 showed patent stents).  Normal LV function.  Nuclear stress test NEG 8/09.  . Depression    ?bipolar dx by psychiatrist?  . Diverticulosis 2006  . ED (erectile dysfunction)   . Gout   . Gouty  arthritis    Dr. Amil Amen  . Heart murmur   . Hematochezia 09/2012   Hyperplastic polyp, diverticulosis, and internal hemorrhoids found on colonoscopy 10/2012  . History of hiatal hernia   . Hyperkalemia 01/29/2017   Kayexalate 30 given x 1.  . Hyperlipidemia    Hx of multiple statin intolerance  . Hypertension   . 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 Migraine    "none in the 2000s" (01/15/2017)  . Mitral valve prolapse ~ 1981  . Myocardial infarction [facility]) ~ 1993- 2005 X 3-4   (01/15/2017)  . Neuropathic pain of both feet 2015/2016   Vit B12 and A1c normal 10/2014  . Obesities, morbid ([place])   . On home oxygen therapy    "2L prn" (01/15/2017)  . OSA on CPAP    w/oxygen (01/15/2017)  . Osteoarthrosis, unspecified whether generalized or localized, unspecified site    DDD  . PAF (paroxysmal atrial fibrillation) ([place]) 12/2016   Started in the context of resp illness/lots of bronchodilators.  Started eliquis and cardizem during hospitalization 01/2017 (Dr. Rayann Heman).  . Pre-diabetes 2016/17  . Prostate cancer metastatic to multiple sites (  [place]) 09/2010; 2018   2012: Localized, high risk disease (Dr. Kimbrough/[place]):  s/p 5 wks ext bm rad + seed boost, as well as hormone blockade x 1 yr.  Biochemical recurrence 11/2015--imaging showed bone mets and lymph node involvement.  Lupron and zytiga started in fall 2018.  . Radiation    Pelvic--for prostate ca: 25 treatments/01/2011  . Seizure disorder ([place]) 08-15-2011   Deja vu sensations: no w/u.  These stopped when neurontin 300mg  tid was started for a different reason.  . Status post chemotherapy    10/2010 thru 06/2011    Current Outpatient Prescriptions on File Prior to Visit  Medication Sig Dispense Refill  . abiraterone Acetate (ZYTIGA) 250 MG tablet Take 4 tablets (1,000 mg total) by mouth daily. Take on an empty stomach 1 hour before or  2 hours after a meal 120 tablet 0  . acetaminophen (TYLENOL) 500 MG tablet Take 2 tablets (1,000 mg total) by mouth 3 (three) times daily. 30 tablet 0  . albuterol (PROAIR HFA) 108 (90 Base) MCG/ACT inhaler Inhale 2 puffs into the lungs every 6 (six) hours as needed for wheezing or shortness of breath.    Marland Kitchen albuterol (PROVENTIL) (2.5 MG/3ML) 0.083% nebulizer solution Take 3 mLs (2.5 mg total) by nebulization every 4 (four) hours as needed for wheezing or shortness of breath. 75 mL 3  . allopurinol (ZYLOPRIM) 300 MG tablet Take 1 tablet (300 mg total) by mouth daily. 90 tablet 3  . apixaban (ELIQUIS) 5 MG TABS tablet Take 1 tablet (5 mg total) by mouth 2 (two) times daily. 180 tablet 3  . bisoprolol-hydrochlorothiazide (ZIAC) 5-6.25 MG tablet Take 1 tablet by mouth every morning. 90 tablet 3  . budesonide-formoterol (SYMBICORT) 160-4.5 MCG/ACT inhaler Inhale 2 puffs into the lungs 2 (two) times daily. 3 Inhaler 3  . citalopram (CELEXA) 40 MG tablet Take 0.5 tablets (20 mg total) by mouth at bedtime. 90 tablet 3  . cyclobenzaprine (FLEXERIL) 10 MG tablet Take 1 tablet (10 mg total) by mouth at bedtime. 90 tablet 1  . dextromethorphan-guaiFENesin (MUCINEX DM) 30-600 MG 12hr tablet Take 1 tablet by mouth 2 (two) times daily as needed for cough.    . diltiazem (CARDIZEM) 30 MG tablet Take 1 tablet (30 mg total) by mouth every 12 (twelve) hours. 60 tablet 11  . feeding supplement, ENSURE ENLIVE, (ENSURE ENLIVE) LIQD Take 237 mLs by mouth 2 (two) times daily between meals. 237 mL 12  . furosemide (LASIX) 40 MG tablet Take 2 tablets (80 mg total) by mouth daily as needed. (Patient taking differently: Take 80 mg by mouth daily as needed for fluid. ) 180 tablet 1  . gabapentin (NEURONTIN) 300 MG capsule Take 1 capsule (300 mg total) by mouth 3 (three) times daily. 90 capsule 6  . hydrochlorothiazide (HYDRODIURIL) 25 MG tablet Take 1 tablet (25 mg total) by mouth daily. 30 tablet 6  . Oxycodone HCl 10 MG TABS  Take 1 tablet (10 mg total) by mouth every 6 (six) hours as needed. 60 tablet 0  . pantoprazole (PROTONIX) 40 MG tablet Take 1 tablet (40 mg total) by mouth daily. 30 tablet 6  . predniSONE (DELTASONE) 5 MG tablet Take 1 tablet (5 mg total) by mouth daily with breakfast. 30 tablet 3  . Probiotic Product (PROBIOTIC PO) Take 1 capsule by mouth daily.    . prochlorperazine (COMPAZINE) 10 MG tablet Take 1 tablet (10 mg total) by mouth every 6 (six) hours as needed for nausea or vomiting.  30 tablet 0  . traMADol (ULTRAM) 50 MG tablet Take 1 tablet (50 mg total) by mouth every 6 (six) hours as needed. 30 tablet 1  . zolpidem (AMBIEN) 10 MG tablet Take 1 tablet (10 mg total) by mouth at bedtime as needed for sleep. 30 tablet 5   No current facility-administered medications on file prior to visit.     Allergies  Allergen Reactions  . Amitriptyline Hcl Nausea Only    REACTION: nausea, elevated bp  . Ezetimibe Other (See Comments)    REACTION: chest pain, fatigue    Assessment/Plan:  1. Hyperlipidemia -

## 2017-04-23 ENCOUNTER — Ambulatory Visit: Payer: Medicare Other

## 2017-04-23 ENCOUNTER — Other Ambulatory Visit: Payer: Self-pay | Admitting: Pharmacist

## 2017-04-23 NOTE — Patient Outreach (Signed)
[facility]) Care Management  04/23/2017  Scott Rollins 11-Sep-1950 [id number]   Patient called and wanted to know how to reorder ProAir. HIPAA identifiers were obtained. An application was completed and was approved for ProAir about 2.5 months ago.  Patient was given [employer]'s phone number:  [phone].  Patient said he would call and reorder ProAir.  Plan: Follow up with patient in about 1 month about reordering Eliquis.   [name], PharmD, [place] Clinical Pharmacist [phone]

## 2017-04-28 ENCOUNTER — Telehealth: Payer: Self-pay | Admitting: Oncology

## 2017-04-28 NOTE — Telephone Encounter (Signed)
04/27/17 prescription refill scanned in media °

## 2017-05-01 ENCOUNTER — Encounter: Payer: Self-pay | Admitting: *Deleted

## 2017-05-01 ENCOUNTER — Other Ambulatory Visit: Payer: Self-pay | Admitting: *Deleted

## 2017-05-01 MED ORDER — ABIRATERONE ACETATE 250 MG PO TABS: 1000.0000 mg | ORAL_TABLET | Freq: Every day | ORAL | 0 refills | Status: DC

## 2017-05-05 ENCOUNTER — Other Ambulatory Visit: Payer: Self-pay

## 2017-05-05 NOTE — Patient Outreach (Addendum)
[name]) Care Management  [place]  05/05/2017   Scott Rollins [birth year] [id number]  Subjective: RN Health Coach spoke with the patient for initial assessment.  The patient lives with his wife.  The patient is independent with his ADLS/IADLS. The patient is adherent with his medications. He states that he tries to follow a low salt low cholesterol diet. RN Health Coach discussed diet with the patient and he verbalized understanding. The patient states that he still smokes but he has cut down considerably.  He smokes a pack every 3 days.  RN Health Coach discussed the effects of smoking on his body and how quitting will also help his breathing.  He verbalized understanding. The patient states that he needs to call Pulmonary to schedule an appointment.  He wants to see about starting pulmonary rehabilitation.   Objective:   Encounter Medications:  Outpatient Encounter Prescriptions as of 05/05/2017  Medication Sig Note  . abiraterone Acetate (ZYTIGA) 250 MG tablet Take 4 tablets (1,000 mg total) by mouth daily. Take on an empty stomach 1 hour before or 2 hours after a meal   . acetaminophen (TYLENOL) 500 MG tablet Take 2 tablets (1,000 mg total) by mouth 3 (three) times daily.   Marland Kitchen albuterol (PROAIR HFA) 108 (90 Base) MCG/ACT inhaler Inhale 2 puffs into the lungs every 6 (six) hours as needed for wheezing or shortness of breath.   Marland Kitchen albuterol (PROVENTIL) (2.5 MG/3ML) 0.083% nebulizer solution Take 3 mLs (2.5 mg total) by nebulization every 4 (four) hours as needed for wheezing or shortness of breath.   . allopurinol (ZYLOPRIM) 300 MG tablet Take 1 tablet (300 mg total) by mouth daily.   Marland Kitchen apixaban (ELIQUIS) 5 MG TABS tablet Take 1 tablet (5 mg total) by mouth 2 (two) times daily.   . bisoprolol-hydrochlorothiazide (ZIAC) 5-6.25 MG tablet Take 1 tablet by mouth every morning.   . budesonide-formoterol (SYMBICORT) 160-4.5 MCG/ACT inhaler Inhale 2 puffs into the  lungs 2 (two) times daily.   . citalopram (CELEXA) 40 MG tablet Take 0.5 tablets (20 mg total) by mouth at bedtime. 02/25/2017: States he is taking a whole tablet 40 mg at bedtime.  . cyclobenzaprine (FLEXERIL) 10 MG tablet Take 1 tablet (10 mg total) by mouth at bedtime. 02/25/2017: States he is taking 10mg  daily at night.    . dextromethorphan-guaiFENesin (MUCINEX DM) 30-600 MG 12hr tablet Take 1 tablet by mouth 2 (two) times daily as needed for cough.   . diltiazem (CARDIZEM) 30 MG tablet Take 1 tablet (30 mg total) by mouth every 12 (twelve) hours.   . feeding supplement, ENSURE ENLIVE, (ENSURE ENLIVE) LIQD Take 237 mLs by mouth 2 (two) times daily between meals.   . furosemide (LASIX) 40 MG tablet Take 2 tablets (80 mg total) by mouth daily as needed. (Patient taking differently: Take 80 mg by mouth daily as needed for fluid. )   . gabapentin (NEURONTIN) 300 MG capsule Take 1 capsule (300 mg total) by mouth 3 (three) times daily.   . hydrochlorothiazide (HYDRODIURIL) 25 MG tablet Take 1 tablet (25 mg total) by mouth daily.   . Oxycodone HCl 10 MG TABS Take 1 tablet (10 mg total) by mouth every 6 (six) hours as needed.   . pantoprazole (PROTONIX) 40 MG tablet Take 1 tablet (40 mg total) by mouth daily.   . predniSONE (DELTASONE) 5 MG tablet Take 1 tablet (5 mg total) by mouth daily with breakfast.   . Probiotic Product (PROBIOTIC  PO) Take 1 capsule by mouth daily.   . prochlorperazine (COMPAZINE) 10 MG tablet Take 1 tablet (10 mg total) by mouth every 6 (six) hours as needed for nausea or vomiting.   . traMADol (ULTRAM) 50 MG tablet Take 1 tablet (50 mg total) by mouth every 6 (six) hours as needed.   . zolpidem (AMBIEN) 10 MG tablet Take 1 tablet (10 mg total) by mouth at bedtime as needed for sleep.    No facility-administered encounter medications on file as of 05/05/2017.     Functional Status:  In your present state of health, do you have any difficulty performing the following  activities: 05/05/2017 04/16/2017  Hearing? Y N  Comment some hearing loss in right ear -  Vision? Y N  Comment waers glasses -  Difficulty concentrating or making decisions? N N  Walking or climbing stairs? Y Y  Comment - pain, unstable, and breathing  Dressing or bathing? N N  Doing errands, shopping? Y N  Comment he states he ha slimitattion -  Conservation officer, nature and eating ? N N  Using the Toilet? N N  In the past six months, have you accidently leaked urine? N N  Comment - -  Do you have problems with loss of bowel control? N N  Managing your Medications? N N  Managing your Finances? N N  Housekeeping or managing your Housekeeping? N N  Some recent data might be hidden    Fall/Depression Screening: Fall Risk  05/05/2017 04/16/2017 02/12/2017  Falls in the past year? No No No  Risk for fall due to : - - (No Data)  Risk for fall due to: Comment - - "i have not fallen but i have been unsteady on my feet"   PHQ 2/9 Scores 05/05/2017 04/16/2017 02/12/2017 11/13/2015  PHQ - 2 Score 2 2 0 3  PHQ- 9 Score 7 5 - 6    Assessment: Patient will benefit from health coach outreach for disease management and support.  THN CM Care Plan Problem One     Most Recent Value  Care Plan Problem One  knowlege deficit [Knowledge Deficit]  Role Documenting the Problem One  Health Coach  Care Plan for Problem One  Active  THN Long Term Goal    patient will work on reduction of smoking a pack of cigarettes from every 3 days to a pack every 2 week in 90 days  [place] Term Goal Start Date  05/05/17  Interventions for Problem One Scott Rollins will send education on smoking cessasstion  THN CM Short Term Goal #1   patient will be able to disscuss COPD action plan in 30 days  THN CM Short Term Goal #1 Start Date  05/05/17  Interventions for Short Term Goal #1  RN Health Coach will send COPD education  [place] CM Short Term Goal #2   patient will be able to discuss the effects of smoking with  heart disease in 30 days  THN CM Short Term Goal #2 Start Date  05/05/17  Interventions for Short Term Goal #2  Scott Rollins will send education on smoking and heart diease        Plan: Scott Rollins will provide ongoing education for patient on COPD through phone calls and sending printed information to patient for further discussion.  RN Health Coach will send welcome packet with consent to patient as well as printed information on COPD.  RN Health Coach will  send initial barriers letter, assessment, and care plan to primary care physician.    Lazaro Arms RN, BSN, [place] Direct Dial:  [phone] Fax: [phone]

## 2017-05-05 NOTE — Patient Outreach (Signed)
[facility]) Care Management  05/05/2017  JARRET TORRE 1950/10/02 [id number]   1st telephone call to patient for Initial assessment.  No answer.  HIPAA complaint voice message left.    Plan:RN Health Coach will make outreach attempt to patient within three business days.   Lazaro Arms RN, BSN, [place] Direct Dial:  [phone] Fax: [phone]

## 2017-05-06 ENCOUNTER — Other Ambulatory Visit: Payer: Self-pay | Admitting: Family Medicine

## 2017-05-06 NOTE — Telephone Encounter (Signed)
Patient requesting gabapentin to be sent to [place].  Please advise.

## 2017-05-06 NOTE — Telephone Encounter (Signed)
I'm going to decline this b/c I did a 1 mo supply of gabapentin with 6 RFs just last month.

## 2017-05-06 NOTE — Telephone Encounter (Signed)
Left detailed message for pt to contact his pharmacy to have RX transferred from [facility] to [place] per DPR.

## 2017-05-07 ENCOUNTER — Ambulatory Visit (HOSPITAL_BASED_OUTPATIENT_CLINIC_OR_DEPARTMENT_OTHER): Payer: Medicare Other | Admitting: Oncology

## 2017-05-07 ENCOUNTER — Telehealth: Payer: Self-pay | Admitting: Oncology

## 2017-05-07 ENCOUNTER — Other Ambulatory Visit (HOSPITAL_BASED_OUTPATIENT_CLINIC_OR_DEPARTMENT_OTHER): Payer: Medicare Other

## 2017-05-07 VITALS — BP 161/72 | HR 67 | Temp 97.5°F | Resp 28 | Ht 70.0 in | Wt 272.9 lb

## 2017-05-07 DIAGNOSIS — C61 Malignant neoplasm of prostate: Secondary | ICD-10-CM

## 2017-05-07 DIAGNOSIS — Z923 Personal history of irradiation: Secondary | ICD-10-CM

## 2017-05-07 DIAGNOSIS — R232 Flushing: Secondary | ICD-10-CM | POA: Diagnosis not present

## 2017-05-07 DIAGNOSIS — C7951 Secondary malignant neoplasm of bone: Secondary | ICD-10-CM

## 2017-05-07 DIAGNOSIS — G893 Neoplasm related pain (acute) (chronic): Secondary | ICD-10-CM

## 2017-05-07 LAB — CBC WITH DIFFERENTIAL/PLATELET
BASO%: 0.5 % (ref 0.0–2.0)
BASOS ABS: 0.1 10*3/uL (ref 0.0–0.1)
EOS ABS: 0.2 10*3/uL (ref 0.0–0.5)
EOS%: 2.4 % (ref 0.0–7.0)
HEMATOCRIT: 45.5 % (ref 38.4–49.9)
HEMOGLOBIN: 15.1 g/dL (ref 13.0–17.1)
LYMPH%: 16.7 % (ref 14.0–49.0)
MCH: 29.4 pg (ref 27.2–33.4)
MCHC: 33.2 g/dL (ref 32.0–36.0)
MCV: 88.7 fL (ref 79.3–98.0)
MONO#: 0.6 10*3/uL (ref 0.1–0.9)
MONO%: 6.1 % (ref 0.0–14.0)
NEUT#: 7.6 10*3/uL — ABNORMAL HIGH (ref 1.5–6.5)
NEUT%: 74.3 % (ref 39.0–75.0)
NRBC: 0 % (ref 0–0)
Platelets: 265 10*3/uL (ref 140–400)
RBC: 5.13 10*6/uL (ref 4.20–5.82)
RDW: 13.5 % (ref 11.0–14.6)
WBC: 10.2 10*3/uL (ref 4.0–10.3)
lymph#: 1.7 10*3/uL (ref 0.9–3.3)

## 2017-05-07 LAB — COMPREHENSIVE METABOLIC PANEL
ALBUMIN: 3.4 g/dL — AB (ref 3.5–5.0)
ALK PHOS: 89 U/L (ref 40–150)
ALT: 12 U/L (ref 0–55)
AST: 15 U/L (ref 5–34)
Anion Gap: 10 mEq/L (ref 3–11)
BILIRUBIN TOTAL: 0.39 mg/dL (ref 0.20–1.20)
BUN: 15.3 mg/dL (ref 7.0–26.0)
CO2: 31 mEq/L — ABNORMAL HIGH (ref 22–29)
CREATININE: 0.8 mg/dL (ref 0.7–1.3)
Calcium: 9.3 mg/dL (ref 8.4–10.4)
Chloride: 99 mEq/L (ref 98–109)
EGFR: >60 mL/min/{1.73_m2} (ref >60)
GLUCOSE: 93 mg/dL (ref 70–140)
Potassium: 3.6 mEq/L (ref 3.5–5.1)
SODIUM: 140 meq/L (ref 136–145)
TOTAL PROTEIN: 6.6 g/dL (ref 6.4–8.3)

## 2017-05-07 MED ORDER — PROCHLORPERAZINE MALEATE 10 MG PO TABS: 10.0000 mg | ORAL_TABLET | Freq: Four times a day (QID) | ORAL | 3 refills | Status: DC | PRN

## 2017-05-07 MED ORDER — OXYCODONE HCL 10 MG PO TABS: 10.0000 mg | ORAL_TABLET | Freq: Four times a day (QID) | ORAL | 0 refills | Status: DC | PRN

## 2017-05-07 MED ORDER — TRAMADOL HCL 50 MG PO TABS: 50.0000 mg | ORAL_TABLET | Freq: Four times a day (QID) | ORAL | 1 refills | Status: DC | PRN

## 2017-05-07 NOTE — Telephone Encounter (Signed)
Gave avs and calendar for December  °

## 2017-05-07 NOTE — Progress Notes (Signed)
Hematology and Oncology Follow Up Visit  Scott Rollins [id number] 1951/05/16 66 y.o. 05/07/2017 3:45 PM [name], [name], MDMcGowen, [name], MD   Principle Diagnosis: 66 year old gentleman with prostate cancer diagnosed in 2012. He presented with Gleason score 4+4 = 8 and PSA of 4.91. He has metastatic disease with lymphadenopathy and bone involvement documented in July 2018.   Prior Therapy:  He is status post external beam radiation after 4 months of androgen deprivation completed in 2012. He also had seed implant boost. Casodex at 50 mg daily will be given for 4 weeks only started on 01/24/2017.  Current therapy:  Lupron started in July 2018 initially at 7.5 mg monthly and will be switched to 30 mg every 4 months starting August 2018.   Zytiga and 1000 mg daily and 5 mg of prednisone. Started in August 2018.  Interim History:  Scott Rollins presents today for a follow-up visit. Since the last visit, he reports no major changes in his health.  He continues to take Zytiga without new side effects or complications.  He does report some mild fatigue and some nausea.  He continues to have diffuse arthralgias and myalgias which has not changed.  He does take oxycodone at days every 6 hours alternating with Ultram.  He has no issues with mobility and denies any falls or syncope.  His appetite remains reasonable although of lost some weight.  Continues to have issues with hot flashes related to Lupron.  He does not report any headaches or blurry vision, syncope or seizures. He does not report any fevers or chills or sweats. He does not report any cough, wheezing or hemoptysis. He does not report any chest pain, palpitation or leg edema. He does not report any nausea, vomiting or abdominal pain. He does not report any frequency urgency or hesitancy. He does not report any skeletal complaints of arthralgias or myalgias. Remaining review of systems unremarkable.  Medications: I have reviewed  the patient's current medications.  Current Outpatient Prescriptions  Medication Sig Dispense Refill  . abiraterone Acetate (ZYTIGA) 250 MG tablet Take 4 tablets (1,000 mg total) by mouth daily. Take on an empty stomach 1 hour before or 2 hours after a meal 120 tablet 0  . acetaminophen (TYLENOL) 500 MG tablet Take 2 tablets (1,000 mg total) by mouth 3 (three) times daily. 30 tablet 0  . albuterol (PROAIR HFA) 108 (90 Base) MCG/ACT inhaler Inhale 2 puffs into the lungs every 6 (six) hours as needed for wheezing or shortness of breath.    Marland Kitchen albuterol (PROVENTIL) (2.5 MG/3ML) 0.083% nebulizer solution Take 3 mLs (2.5 mg total) by nebulization every 4 (four) hours as needed for wheezing or shortness of breath. 75 mL 3  . allopurinol (ZYLOPRIM) 300 MG tablet Take 1 tablet (300 mg total) by mouth daily. 90 tablet 3  . apixaban (ELIQUIS) 5 MG TABS tablet Take 1 tablet (5 mg total) by mouth 2 (two) times daily. 180 tablet 3  . bisoprolol-hydrochlorothiazide (ZIAC) 5-6.25 MG tablet Take 1 tablet by mouth every morning. 90 tablet 3  . budesonide-formoterol (SYMBICORT) 160-4.5 MCG/ACT inhaler Inhale 2 puffs into the lungs 2 (two) times daily. 3 Inhaler 3  . citalopram (CELEXA) 40 MG tablet Take 0.5 tablets (20 mg total) by mouth at bedtime. 90 tablet 3  . cyclobenzaprine (FLEXERIL) 10 MG tablet Take 1 tablet (10 mg total) by mouth at bedtime. 90 tablet 1  . dextromethorphan-guaiFENesin (MUCINEX DM) 30-600 MG 12hr tablet Take 1 tablet by mouth  2 (two) times daily as needed for cough.    . diltiazem (CARDIZEM) 30 MG tablet Take 1 tablet (30 mg total) by mouth every 12 (twelve) hours. 60 tablet 11  . feeding supplement, ENSURE ENLIVE, (ENSURE ENLIVE) LIQD Take 237 mLs by mouth 2 (two) times daily between meals. 237 mL 12  . furosemide (LASIX) 40 MG tablet Take 2 tablets (80 mg total) by mouth daily as needed. (Patient taking differently: Take 80 mg by mouth daily as needed for fluid. ) 180 tablet 1  . gabapentin  (NEURONTIN) 300 MG capsule Take 1 capsule (300 mg total) by mouth 3 (three) times daily. 90 capsule 6  . hydrochlorothiazide (HYDRODIURIL) 25 MG tablet Take 1 tablet (25 mg total) by mouth daily. 30 tablet 6  . Oxycodone HCl 10 MG TABS Take 1 tablet (10 mg total) by mouth every 6 (six) hours as needed. 60 tablet 0  . pantoprazole (PROTONIX) 40 MG tablet Take 1 tablet (40 mg total) by mouth daily. 30 tablet 6  . predniSONE (DELTASONE) 5 MG tablet Take 1 tablet (5 mg total) by mouth daily with breakfast. 30 tablet 3  . Probiotic Product (PROBIOTIC PO) Take 1 capsule by mouth daily.    . prochlorperazine (COMPAZINE) 10 MG tablet Take 1 tablet (10 mg total) by mouth every 6 (six) hours as needed for nausea or vomiting. 30 tablet 3  . traMADol (ULTRAM) 50 MG tablet Take 1 tablet (50 mg total) by mouth every 6 (six) hours as needed. 60 tablet 1  . zolpidem (AMBIEN) 10 MG tablet Take 1 tablet (10 mg total) by mouth at bedtime as needed for sleep. 30 tablet 5   No current facility-administered medications for this visit.      Allergies:  Allergies  Allergen Reactions  . Amitriptyline Hcl Nausea Only    REACTION: nausea, elevated bp  . Ezetimibe Other (See Comments)    REACTION: chest pain, fatigue    Past Medical History, Surgical history, Social history, and Family History were reviewed and updated.   Physical Exam: Blood pressure (!) 161/72, pulse 67, temperature (!) 97.5 F (36.4 C), temperature source Oral, resp. rate (!) 28, height 5\' 10"  (1.778 m), weight 272 lb 14.4 oz (123.8 kg), SpO2 96 %. ECOG: 1 General appearance: Alert, awake gentleman without distress. Head: Normocephalic, without obvious abnormality no oral thrush or ulcers. Neck: no adenopathy or masses. Lymph nodes: Cervical, supraclavicular, and axillary nodes normal. Heart:regular rate and rhythm, S1, S2 normal, no murmur, click, rub or gallop Lung:chest clear, no wheezing, rales, normal symmetric air entry Abdomin:  soft, non-tender, without masses or organomegaly no shifting dullness or ascites. EXT:no erythema, induration, or nodules   Lab Results: Lab Results  Component Value Date   WBC 10.2 05/07/2017   HGB 15.1 05/07/2017   HCT 45.5 05/07/2017   MCV 88.7 05/07/2017   PLT 265 05/07/2017     Chemistry      Component Value Date/Time   NA 140 05/07/2017 1445   K 3.6 05/07/2017 1445   CL 100 02/03/2017 1453   CO2 31 (H) 05/07/2017 1445   BUN 15.3 05/07/2017 1445   CREATININE 0.8 05/07/2017 1445      Component Value Date/Time   CALCIUM 9.3 05/07/2017 1445   ALKPHOS 89 05/07/2017 1445   AST 15 05/07/2017 1445   ALT 12 05/07/2017 1445   BILITOT 0.39 05/07/2017 1445      Results for Scott Rollins, Scott Rollins (MRN [id number]) as of 05/07/2017 15:47  Ref.  Range 01/21/2017 18:41 02/24/2017 14:45 03/25/2017 15:04  Prostate Specific Ag, Serum Latest Ref Range: 0.0 - 4.0 ng/mL  18.4 (H) 8.5 (H)  Prostatic Specific Antigen Latest Ref Range: 0.00 - 4.00 ng/mL 138.00 (H)       Impression and Plan:   66 year old gentleman with the following issues:  1. Prostate cancer diagnosed in 2012. His Gleason score 4+4 = 8 and a PSA of 4.91. He was treated with androgen deprivation therapy, external beam radiation and seed implant boost. He developed recurrent disease in May 2017 with a PSA of 6.15. In July 2018 his PSA was 138 with bone metastasis as well as diffuse lymphadenopathy.  He is currently receiving Zytiga 1000 mg daily with prednisone.   His PSA continues to show excellent response on Lupron and Zytiga and the plan is to continue with the same dose and schedule.  Risks and benefits of continuing both medications were reviewed today and is agreeable to continue.  2. Androgen deprivation therapy: He will continue Lupron 30 mg every 4 months. Last treatment given 02/24/2017 and will be repeated in December 2018.  3. Bone directed therapy: He will be a candidate for Xgeva after obtaining dental  clearance.  He will start calcium and vitamin D supplements and has a dentist appointment in the near future.  4. Pain: Appears to be manageable with oxycodone at 10 mg 3 times a day.  This was refilled for him today as well as prescription for Ultram.  5. Nausea: Prescription for Compazine was filled for him at this time.  6. Follow-up: Will be in 6 weeks to follow his progress.   Zola Button, MD 10/25/20183:45 PM

## 2017-05-08 ENCOUNTER — Telehealth: Payer: Self-pay | Admitting: *Deleted

## 2017-05-08 ENCOUNTER — Ambulatory Visit: Payer: Self-pay

## 2017-05-08 LAB — PSA: Prostate Specific Ag, Serum: 4.2 ng/mL — ABNORMAL HIGH (ref 0.0–4.0)

## 2017-05-08 NOTE — Telephone Encounter (Signed)
-----   Message from Wyatt Portela, MD sent at 05/08/2017  8:47 AM EDT ----- Please let him know his PSA is down.

## 2017-05-08 NOTE — Telephone Encounter (Signed)
As noted below by Dr. Shadad, I informed patient of his PSA level. He verbalized understanding.  

## 2017-05-13 ENCOUNTER — Ambulatory Visit: Payer: Medicare Other

## 2017-05-14 ENCOUNTER — Ambulatory Visit: Payer: Medicare Other | Admitting: Family Medicine

## 2017-05-21 ENCOUNTER — Other Ambulatory Visit: Payer: Self-pay | Admitting: Pharmacist

## 2017-05-21 NOTE — Patient Outreach (Addendum)
[facility]) Care Management  05/21/2017  Scott Rollins [birth year] [id number]   Patient was called to follow up on reorders of patient assistance medications. Unfortunately, patient did not answer the phone. HIPAA compliant message was left on the patient's voicemail.  Plan: Call patient back in 7-10 business days.   [name], PharmD, [place] Clinical Pharmacist [phone]  ADDENDUM Patient called me back. HIPAA identifiers were obtained. Patient said he received all his medications from the respective patient assistance programs and does not need anything else this year.  Plan: Touch bases with the patient at the beginning of the year.   [name], PharmD, [place] Clinical Pharmacist [phone]

## 2017-05-28 ENCOUNTER — Ambulatory Visit: Payer: Medicare Other | Admitting: Pharmacist

## 2017-06-01 ENCOUNTER — Other Ambulatory Visit: Payer: Self-pay | Admitting: Pharmacist

## 2017-06-01 ENCOUNTER — Other Ambulatory Visit: Payer: Self-pay | Admitting: *Deleted

## 2017-06-01 MED ORDER — ABIRATERONE ACETATE 250 MG PO TABS: 1000.0000 mg | ORAL_TABLET | Freq: Every day | ORAL | 0 refills | Status: DC

## 2017-06-01 NOTE — Patient Outreach (Signed)
[facility]) Care Management  06/01/2017  Scott Rollins Jul 10, 1951 [id number]   Patient called stating one of the [name]'s from Dr. Rulon Sera office Nira Conn), called him about patient assistance forms for Eliquis for next year. Apparently, [place] Patient Assistance Program mailed a new application to the patient's provider's office for the patient to reapply for next year.  Patient said Dr. Rulon Sera office completed their part and were going to mail the application back to him for his signature. Patient wondered if he could forward the completed application to the [facility] office to my attention.  Patient was told he could.  Plan:  Coordinate with Doreene Burke once the forms are received.   [name], PharmD, [place] Clinical Pharmacist [phone]

## 2017-06-08 ENCOUNTER — Other Ambulatory Visit: Payer: Self-pay

## 2017-06-08 NOTE — Patient Outreach (Addendum)
[facility]) Care Management  [place]  06/08/2017   PHU RECORD Nov 28, 1950 [id number]  Subjective: Telephone call to patient for monthly outreach. HIPAA verified. The patient stated he has been doing ok but has had some shortness of breath.  He is using his nebulizer and rescue inhaler.  He is also taking his mucinex.  He denies any fever.  He states that he is adherent with all of his medications.  RN Health Coach advised the patient to contact his doctor's office if he feels his symptoms are getting worse. The patient states that he is still smoking about a pack of cigarettes every three days. We discussed other methods to help him with his smoking cessation. He stated that he would be interested in trying the nicotine inhalers.  He has tried the Nicorette gum and Chantix but he felt they were of no use. The patient has had some pain in his right knee he rated at about a 7. He is taking his prescribed medications to help with the pain. The patient has not called Pulmonary Rehab due to the holidays but plans on calling before we speak next month.  Objective:   Encounter Medications:  Outpatient Encounter Medications as of 06/08/2017  Medication Sig Note  . abiraterone acetate (ZYTIGA) 250 MG tablet Take 4 tablets (1,000 mg total) daily by mouth. Take on an empty stomach 1 hour before or 2 hours after a meal   . acetaminophen (TYLENOL) 500 MG tablet Take 2 tablets (1,000 mg total) by mouth 3 (three) times daily.   Marland Kitchen albuterol (PROAIR HFA) 108 (90 Base) MCG/ACT inhaler Inhale 2 puffs into the lungs every 6 (six) hours as needed for wheezing or shortness of breath.   Marland Kitchen albuterol (PROVENTIL) (2.5 MG/3ML) 0.083% nebulizer solution Take 3 mLs (2.5 mg total) by nebulization every 4 (four) hours as needed for wheezing or shortness of breath.   . allopurinol (ZYLOPRIM) 300 MG tablet Take 1 tablet (300 mg total) by mouth daily.   Marland Kitchen apixaban (ELIQUIS) 5 MG TABS tablet  Take 1 tablet (5 mg total) by mouth 2 (two) times daily.   . bisoprolol-hydrochlorothiazide (ZIAC) 5-6.25 MG tablet Take 1 tablet by mouth every morning.   . budesonide-formoterol (SYMBICORT) 160-4.5 MCG/ACT inhaler Inhale 2 puffs into the lungs 2 (two) times daily.   . citalopram (CELEXA) 40 MG tablet Take 0.5 tablets (20 mg total) by mouth at bedtime. 02/25/2017: States he is taking a whole tablet 40 mg at bedtime.  . cyclobenzaprine (FLEXERIL) 10 MG tablet Take 1 tablet (10 mg total) by mouth at bedtime. 02/25/2017: States he is taking 10mg  daily at night.    . dextromethorphan-guaiFENesin (MUCINEX DM) 30-600 MG 12hr tablet Take 1 tablet by mouth 2 (two) times daily as needed for cough.   . diltiazem (CARDIZEM) 30 MG tablet Take 1 tablet (30 mg total) by mouth every 12 (twelve) hours.   . feeding supplement, ENSURE ENLIVE, (ENSURE ENLIVE) LIQD Take 237 mLs by mouth 2 (two) times daily between meals.   . furosemide (LASIX) 40 MG tablet Take 2 tablets (80 mg total) by mouth daily as needed. (Patient taking differently: Take 80 mg by mouth daily as needed for fluid. )   . gabapentin (NEURONTIN) 300 MG capsule Take 1 capsule (300 mg total) by mouth 3 (three) times daily.   . hydrochlorothiazide (HYDRODIURIL) 25 MG tablet Take 1 tablet (25 mg total) by mouth daily.   . Oxycodone HCl 10 MG TABS Take  1 tablet (10 mg total) by mouth every 6 (six) hours as needed.   . pantoprazole (PROTONIX) 40 MG tablet Take 1 tablet (40 mg total) by mouth daily.   . predniSONE (DELTASONE) 5 MG tablet Take 1 tablet (5 mg total) by mouth daily with breakfast.   . Probiotic Product (PROBIOTIC PO) Take 1 capsule by mouth daily.   . prochlorperazine (COMPAZINE) 10 MG tablet Take 1 tablet (10 mg total) by mouth every 6 (six) hours as needed for nausea or vomiting.   . traMADol (ULTRAM) 50 MG tablet Take 1 tablet (50 mg total) by mouth every 6 (six) hours as needed.   . zolpidem (AMBIEN) 10 MG tablet Take 1 tablet (10 mg total)  by mouth at bedtime as needed for sleep.    No facility-administered encounter medications on file as of 06/08/2017.     Functional Status:  In your present state of health, do you have any difficulty performing the following activities: 05/05/2017 04/16/2017  Hearing? Y N  Comment some hearing loss in right ear -  Vision? Y N  Comment waers glasses -  Difficulty concentrating or making decisions? N N  Walking or climbing stairs? Y Y  Comment - pain, unstable, and breathing  Dressing or bathing? N N  Doing errands, shopping? Y N  Comment he states he ha slimitattion -  Conservation officer, nature and eating ? N N  Using the Toilet? N N  In the past six months, have you accidently leaked urine? N N  Comment - -  Do you have problems with loss of bowel control? N N  Managing your Medications? N N  Managing your Finances? N N  Housekeeping or managing your Housekeeping? N N  Some recent data might be hidden    Fall/Depression Screening: Fall Risk  06/08/2017 05/05/2017 04/16/2017  Falls in the past year? No No No  Risk for fall due to : - - -  Risk for fall due to: Comment - - -   PHQ 2/9 Scores 05/05/2017 04/16/2017 02/12/2017 11/13/2015  PHQ - 2 Score 2 2 0 3  PHQ- 9 Score 7 5 - 6    Assessment: Patient continues to benefit from health coach outreach for disease management and sup  [facility] CM Care Plan Problem One     Most Recent Value  Care Plan Problem One  knowlege deficit [Knowledge Deficit]  Role Documenting the Problem One  [place] for Problem One  Active  [facility] Long Term Goal    patient will work on reduction of smoking a pack of cigarettes from every 3 days to a pack every 2 week in 90 days  [place] Term Goal Start Date  05/05/17  Interventions for Problem One [place] discussed with the patient options he has for smoking cessation.  The patient is interested in using the nicotine inhaler.  [facility] CM Short Term Goal #1   patient will be able to  disscuss COPD action plan in 30 days  [facility] CM Short Term Goal #1 Start Date  05/05/17  Interventions for Short Term Goal #1  RN Health Coach reviewed the copd action plan with the patient.  [facility] CM Short Term Goal #2   patient will be able to discuss the effects of smoking with heart disease in 30 days  [facility] CM Short Term Goal #2 Start Date  05/05/17  Interventions for Short Term Goal #2  RN Health Coach  reinterated the effects of smoking on the heart and breathing.      Plan: RN Health Coach will contact patient in the month of December and patient agrees to next outreach.  Lazaro Arms RN, BSN, [place] Direct Dial:  [phone] Fax: [phone]

## 2017-06-11 ENCOUNTER — Other Ambulatory Visit: Payer: Self-pay | Admitting: Medical Oncology

## 2017-06-11 ENCOUNTER — Telehealth: Payer: Self-pay | Admitting: Medical Oncology

## 2017-06-11 DIAGNOSIS — C7951 Secondary malignant neoplasm of bone: Principal | ICD-10-CM

## 2017-06-11 DIAGNOSIS — C61 Malignant neoplasm of prostate: Secondary | ICD-10-CM

## 2017-06-11 MED ORDER — OXYCODONE HCL 10 MG PO TABS: 10.0000 mg | ORAL_TABLET | Freq: Four times a day (QID) | ORAL | 0 refills | Status: DC | PRN

## 2017-06-11 NOTE — Telephone Encounter (Signed)
Needs refill OxyContin -1 dose left for tomorrow. Call when ready. Pt notified rx ready for  pick up.

## 2017-06-22 ENCOUNTER — Other Ambulatory Visit: Payer: Self-pay | Admitting: Oncology

## 2017-06-26 ENCOUNTER — Ambulatory Visit (HOSPITAL_BASED_OUTPATIENT_CLINIC_OR_DEPARTMENT_OTHER): Payer: Medicare Other

## 2017-06-26 ENCOUNTER — Other Ambulatory Visit: Payer: Medicare Other

## 2017-06-26 ENCOUNTER — Ambulatory Visit (HOSPITAL_BASED_OUTPATIENT_CLINIC_OR_DEPARTMENT_OTHER): Payer: Medicare Other | Admitting: Oncology

## 2017-06-26 ENCOUNTER — Other Ambulatory Visit (HOSPITAL_BASED_OUTPATIENT_CLINIC_OR_DEPARTMENT_OTHER): Payer: Medicare Other

## 2017-06-26 ENCOUNTER — Telehealth: Payer: Self-pay | Admitting: Oncology

## 2017-06-26 ENCOUNTER — Other Ambulatory Visit: Payer: Self-pay | Admitting: *Deleted

## 2017-06-26 ENCOUNTER — Ambulatory Visit: Payer: Medicare Other

## 2017-06-26 ENCOUNTER — Ambulatory Visit: Payer: Medicare Other | Admitting: Oncology

## 2017-06-26 VITALS — BP 171/98 | HR 72 | Temp 98.7°F | Resp 18 | Ht 70.0 in | Wt 274.8 lb

## 2017-06-26 DIAGNOSIS — C7951 Secondary malignant neoplasm of bone: Secondary | ICD-10-CM | POA: Diagnosis not present

## 2017-06-26 DIAGNOSIS — Z923 Personal history of irradiation: Secondary | ICD-10-CM

## 2017-06-26 DIAGNOSIS — C61 Malignant neoplasm of prostate: Secondary | ICD-10-CM

## 2017-06-26 DIAGNOSIS — G893 Neoplasm related pain (acute) (chronic): Secondary | ICD-10-CM

## 2017-06-26 DIAGNOSIS — Z5111 Encounter for antineoplastic chemotherapy: Secondary | ICD-10-CM | POA: Diagnosis present

## 2017-06-26 LAB — CBC WITH DIFFERENTIAL/PLATELET
BASO%: 0.4 % (ref 0.0–2.0)
Basophils Absolute: 0 10*3/uL (ref 0.0–0.1)
EOS%: 1.9 % (ref 0.0–7.0)
Eosinophils Absolute: 0.2 10*3/uL (ref 0.0–0.5)
HCT: 46.2 % (ref 38.4–49.9)
HEMOGLOBIN: 15.1 g/dL (ref 13.0–17.1)
LYMPH%: 15.8 % (ref 14.0–49.0)
MCH: 29.4 pg (ref 27.2–33.4)
MCHC: 32.7 g/dL (ref 32.0–36.0)
MCV: 90.1 fL (ref 79.3–98.0)
MONO#: 0.5 10*3/uL (ref 0.1–0.9)
MONO%: 4.3 % (ref 0.0–14.0)
NEUT%: 77.6 % — ABNORMAL HIGH (ref 39.0–75.0)
NEUTROS ABS: 8.4 10*3/uL — AB (ref 1.5–6.5)
Platelets: 275 10*3/uL (ref 140–400)
RBC: 5.13 10*6/uL (ref 4.20–5.82)
RDW: 13.3 % (ref 11.0–14.6)
WBC: 10.8 10*3/uL — AB (ref 4.0–10.3)
lymph#: 1.7 10*3/uL (ref 0.9–3.3)

## 2017-06-26 LAB — COMPREHENSIVE METABOLIC PANEL
ALK PHOS: 103 U/L (ref 40–150)
ALT: 21 U/L (ref 0–55)
ANION GAP: 13 meq/L — AB (ref 3–11)
AST: 20 U/L (ref 5–34)
Albumin: 3.6 g/dL (ref 3.5–5.0)
BILIRUBIN TOTAL: 0.47 mg/dL (ref 0.20–1.20)
BUN: 18 mg/dL (ref 7.0–26.0)
CALCIUM: 9.4 mg/dL (ref 8.4–10.4)
CHLORIDE: 98 meq/L (ref 98–109)
CO2: 30 mEq/L — ABNORMAL HIGH (ref 22–29)
CREATININE: 1 mg/dL (ref 0.7–1.3)
EGFR: >60 mL/min/{1.73_m2} (ref >60)
Glucose: 98 mg/dl (ref 70–140)
Potassium: 4.3 mEq/L (ref 3.5–5.1)
Sodium: 141 mEq/L (ref 136–145)
Total Protein: 7 g/dL (ref 6.4–8.3)

## 2017-06-26 MED ORDER — TRAMADOL HCL 50 MG PO TABS: 50.0000 mg | ORAL_TABLET | Freq: Four times a day (QID) | ORAL | 1 refills | Status: DC | PRN

## 2017-06-26 MED ORDER — LEUPROLIDE ACETATE (4 MONTH) 30 MG IM KIT
30.0000 mg | PACK | Freq: Once | INTRAMUSCULAR | Status: AC
  Administered 2017-06-26: 30 mg via INTRAMUSCULAR
  Filled 2017-06-26: qty 30

## 2017-06-26 MED ORDER — OXYCODONE HCL 10 MG PO TABS: 10.0000 mg | ORAL_TABLET | Freq: Four times a day (QID) | ORAL | 0 refills | Status: DC | PRN

## 2017-06-26 NOTE — Progress Notes (Signed)
Hematology and Oncology Follow Up Visit  Scott Rollins [id number] 03/24/1951 66 y.o. 06/26/2017 3:32 PM [name], [name], MDMcGowen, [name], MD   Principle Diagnosis: 66 year old gentleman with prostate cancer diagnosed in 2012. He presented with Gleason score 4+4 = 8 and PSA of 4.91. He has metastatic disease with lymphadenopathy and bone involvement documented in July 2018.   Prior Therapy:  He is status post external beam radiation after 4 months of androgen deprivation completed in 2012. He also had seed implant boost. Casodex at 50 mg daily will be given for 4 weeks only started on 01/24/2017.  Current therapy:  Lupron started in July 2018 initially at 7.5 mg monthly and will be switched to 30 mg every 4 months starting August 2018.   Zytiga and 1000 mg daily and 5 mg of prednisone. Started in August 2018.  Interim History:  Scott Rollins presents today for a follow-up visit. Since the last visit, he reports feeling reasonably fair without any major changes.  He continues to take Zytiga without new side effects or complications.  He continues to have issues with fatigue but not dramatically changed.  He does have chronic pain that has been intermittent and not changed dramatically.  He takes oxycodone at days every 6 hours alternating with Ultram.  Continues to have a reasonable appetite and performance status.  He denies any falls or syncope.  He denies any pathological fractures.  He is still undergoing dental evaluation and possible surgery in the future.  He does not report any headaches or blurry vision, syncope or seizures. He does not report any fevers or chills or sweats. He does not report any cough, wheezing or hemoptysis. He does not report any chest pain, palpitation or leg edema. He does not report any nausea, vomiting or abdominal pain. He does not report any frequency urgency or hesitancy. He does not report any skeletal complaints of arthralgias or myalgias.  Remaining review of systems unremarkable.  Medications: I have reviewed the patient's current medications.  Current Outpatient Medications  Medication Sig Dispense Refill  . abiraterone acetate (ZYTIGA) 250 MG tablet Take 4 tablets (1,000 mg total) daily by mouth. Take on an empty stomach 1 hour before or 2 hours after a meal 120 tablet 0  . acetaminophen (TYLENOL) 500 MG tablet Take 2 tablets (1,000 mg total) by mouth 3 (three) times daily. 30 tablet 0  . albuterol (PROAIR HFA) 108 (90 Base) MCG/ACT inhaler Inhale 2 puffs into the lungs every 6 (six) hours as needed for wheezing or shortness of breath.    Marland Kitchen albuterol (PROVENTIL) (2.5 MG/3ML) 0.083% nebulizer solution Take 3 mLs (2.5 mg total) by nebulization every 4 (four) hours as needed for wheezing or shortness of breath. 75 mL 3  . allopurinol (ZYLOPRIM) 300 MG tablet Take 1 tablet (300 mg total) by mouth daily. 90 tablet 3  . apixaban (ELIQUIS) 5 MG TABS tablet Take 1 tablet (5 mg total) by mouth 2 (two) times daily. 180 tablet 3  . bisoprolol-hydrochlorothiazide (ZIAC) 5-6.25 MG tablet Take 1 tablet by mouth every morning. 90 tablet 3  . budesonide-formoterol (SYMBICORT) 160-4.5 MCG/ACT inhaler Inhale 2 puffs into the lungs 2 (two) times daily. 3 Inhaler 3  . citalopram (CELEXA) 40 MG tablet Take 0.5 tablets (20 mg total) by mouth at bedtime. 90 tablet 3  . cyclobenzaprine (FLEXERIL) 10 MG tablet Take 1 tablet (10 mg total) by mouth at bedtime. 90 tablet 1  . dextromethorphan-guaiFENesin (MUCINEX DM) 30-600 MG 12hr tablet  Take 1 tablet by mouth 2 (two) times daily as needed for cough.    . diltiazem (CARDIZEM) 30 MG tablet Take 1 tablet (30 mg total) by mouth every 12 (twelve) hours. 60 tablet 11  . feeding supplement, ENSURE ENLIVE, (ENSURE ENLIVE) LIQD Take 237 mLs by mouth 2 (two) times daily between meals. 237 mL 12  . furosemide (LASIX) 40 MG tablet Take 2 tablets (80 mg total) by mouth daily as needed. (Patient taking differently: Take  80 mg by mouth daily as needed for fluid. ) 180 tablet 1  . gabapentin (NEURONTIN) 300 MG capsule Take 1 capsule (300 mg total) by mouth 3 (three) times daily. 90 capsule 6  . hydrochlorothiazide (HYDRODIURIL) 25 MG tablet Take 1 tablet (25 mg total) by mouth daily. 30 tablet 6  . Oxycodone HCl 10 MG TABS Take 1 tablet (10 mg total) by mouth every 6 (six) hours as needed. 60 tablet 0  . pantoprazole (PROTONIX) 40 MG tablet Take 1 tablet (40 mg total) by mouth daily. 30 tablet 6  . predniSONE (DELTASONE) 5 MG tablet TAKE 1 TABLET BY MOUTH EVERY DAY WITH BREAKFAST. 30 tablet 2  . Probiotic Product (PROBIOTIC PO) Take 1 capsule by mouth daily.    . prochlorperazine (COMPAZINE) 10 MG tablet Take 1 tablet (10 mg total) by mouth every 6 (six) hours as needed for nausea or vomiting. 30 tablet 3  . traMADol (ULTRAM) 50 MG tablet Take 1 tablet (50 mg total) by mouth every 6 (six) hours as needed. 60 tablet 1  . zolpidem (AMBIEN) 10 MG tablet Take 1 tablet (10 mg total) by mouth at bedtime as needed for sleep. 30 tablet 5   No current facility-administered medications for this visit.    Facility-Administered Medications Ordered in Other Visits  Medication Dose Route Frequency Provider Last Rate Last Dose  . leuprolide (LUPRON) injection 30 mg  30 mg Intramuscular Once Wyatt Portela, MD         Allergies:  Allergies  Allergen Reactions  . Amitriptyline Hcl Nausea Only    REACTION: nausea, elevated bp  . Ezetimibe Other (See Comments)    REACTION: chest pain, fatigue    Past Medical History, Surgical history, Social history, and Family History were reviewed and updated.   Physical Exam: Blood pressure (!) 171/98, pulse 72, temperature 98.7 F (37.1 C), resp. rate 18, height 5\' 10"  (1.778 m), weight 274 lb 12.8 oz (124.6 kg), SpO2 93 %. ECOG: 1 General appearance: Well-appearing gentleman without distress. Head: Normocephalic, without obvious abnormality no oral ulcers or lesions. Neck: no  adenopathy or masses. Lymph nodes: Cervical, supraclavicular, and axillary nodes normal. Heart:regular rate and rhythm, S1, S2 normal, no murmur, click, rub or gallop Lung:chest clear, no wheezing, rales, normal symmetric air entry Abdomin: soft, non-tender, without masses or organomegaly no rebound or guarding. EXT:no erythema, induration, or nodules   Lab Results: Lab Results  Component Value Date   WBC 10.8 (H) 06/26/2017   HGB 15.1 06/26/2017   HCT 46.2 06/26/2017   MCV 90.1 06/26/2017   PLT 275 06/26/2017     Chemistry      Component Value Date/Time   NA 141 06/26/2017 1444   K 4.3 06/26/2017 1444   CL 100 02/03/2017 1453   CO2 30 (H) 06/26/2017 1444   BUN 18.0 06/26/2017 1444   CREATININE 1.0 06/26/2017 1444      Component Value Date/Time   CALCIUM 9.4 06/26/2017 1444   ALKPHOS 103 06/26/2017 1444  AST 20 06/26/2017 1444   ALT 21 06/26/2017 1444   BILITOT 0.47 06/26/2017 1444       Results for Scott Rollins, Scott "BOB" (MRN [id number]) as of 06/26/2017 14:50  Ref. Range 01/21/2017 18:41 02/24/2017 14:45 03/25/2017 15:04 05/07/2017 14:45  Prostate Specific Ag, Serum Latest Ref Range: 0.0 - 4.0 ng/mL  18.4 (H) 8.5 (H) 4.2 (H)  Prostatic Specific Antigen Latest Ref Range: 0.00 - 4.00 ng/mL 138.00 (H)       Impression and Plan:   66 year old gentleman with the following issues:  1. Prostate cancer diagnosed in 2012. His Gleason score 4+4 = 8 and a PSA of 4.91. He was treated with androgen deprivation therapy, external beam radiation and seed implant boost. He developed recurrent disease in May 2017 with a PSA of 6.15. In July 2018 his PSA was 138 with bone metastasis as well as diffuse lymphadenopathy.  He is currently receiving Zytiga 1000 mg daily with prednisone in addition to Lupron.  His PSA is currently low at 4.2 and continues to show excellent response.  Risks and benefits of continuing this treatment was discussed today and is agreeable to continue.  2.  Androgen deprivation therapy: He will continue Lupron 30 mg every 4 months.  He will receive Lupron on June 26, 2017.  3. Bone directed therapy: Delton See will not be given at this time given his anticipated dental procedure.  He will continue on calcium and vitamin D.  We have discussed extensively osteonecrosis of the jaw as a potential complication and for the time being we will defer that option.  4. Pain: Appears to be manageable with oxycodone at 10 mg 3 times a day.  This was refilled for him today as well as prescription for Ultram.  5. Nausea: Compazine is available to him and has been successful in treating nausea.  6. Follow-up: Will be in 6 weeks to follow his progress.   Zola Button, MD 12/14/20183:32 PM

## 2017-06-26 NOTE — Telephone Encounter (Signed)
Scheduled appt per 12/14 los - Gave patient aVS and calender per los.

## 2017-06-27 LAB — PSA: PROSTATE SPECIFIC AG, SERUM: 3.6 ng/mL (ref 0.0–4.0)

## 2017-07-01 ENCOUNTER — Telehealth: Payer: Self-pay | Admitting: *Deleted

## 2017-07-01 NOTE — Telephone Encounter (Signed)
Spoke with patient. Gave results of last PSA. Mailed copy of lab values and explanation to patient's   Home.

## 2017-07-01 NOTE — Telephone Encounter (Signed)
-----   Message from Wyatt Portela, MD sent at 06/30/2017  8:51 AM EST ----- Please let him know his PSA is down again.

## 2017-07-03 ENCOUNTER — Other Ambulatory Visit: Payer: Self-pay | Admitting: *Deleted

## 2017-07-03 MED ORDER — ABIRATERONE ACETATE 250 MG PO TABS: 1000.0000 mg | ORAL_TABLET | Freq: Every day | ORAL | 0 refills | Status: DC

## 2017-07-05 ENCOUNTER — Encounter: Payer: Self-pay | Admitting: Family Medicine

## 2017-07-09 ENCOUNTER — Other Ambulatory Visit: Payer: Self-pay

## 2017-07-09 NOTE — Patient Outreach (Signed)
[facility]) Care Management  [place]  07/09/2017   Scott Rollins 10/21/1950 [id number]  Subjective: Telephone call placed to the patient for monthly assessment.  HIPAA verified. The patient states that he is doing ok but has a cold and feels tired. He states that it is affecting his breathing some.  He is using his nebulizer and rescue inhaler.  He is also taking his mucinex and  states that the mucous is starting to breakup. RN [facility] advised to the patient to call his physician if his symptoms get worse due to holiday schedules.  Patient verbalized understanding. He denies any fever or pain. The patient states that he is adherent with is medications.  He stated that he had taken his night medications early and also two capsules of benadryl.  He became dizzy, lightheaded, passed out and fell.  He was uninjured except for some scrapes.  RN [facility] explained that there could have been a reaction between the Ambien and benadryl. Again RN [facility] stressed the importance of a visit with his physician to discuss his fall and about his smoking.  Patient verbalized understanding.  He states that he does not have an appointment set to see his primary and needs to call.  Objective:   Encounter Medications:  Outpatient Encounter Medications as of 07/09/2017  Medication Sig Note  . abiraterone acetate (ZYTIGA) 250 MG tablet Take 4 tablets (1,000 mg total) by mouth daily. Take on an empty stomach 1 hour before or 2 hours after a meal   . acetaminophen (TYLENOL) 500 MG tablet Take 2 tablets (1,000 mg total) by mouth 3 (three) times daily.   Marland Kitchen albuterol (PROAIR HFA) 108 (90 Base) MCG/ACT inhaler Inhale 2 puffs into the lungs every 6 (six) hours as needed for wheezing or shortness of breath.   Marland Kitchen albuterol (PROVENTIL) (2.5 MG/3ML) 0.083% nebulizer solution Take 3 mLs (2.5 mg total) by nebulization every 4 (four) hours as needed for wheezing or shortness of breath.   . allopurinol (ZYLOPRIM)  300 MG tablet Take 1 tablet (300 mg total) by mouth daily.   . bisoprolol-hydrochlorothiazide (ZIAC) 5-6.25 MG tablet Take 1 tablet by mouth every morning.   . budesonide-formoterol (SYMBICORT) 160-4.5 MCG/ACT inhaler Inhale 2 puffs into the lungs 2 (two) times daily.   . citalopram (CELEXA) 40 MG tablet Take 0.5 tablets (20 mg total) by mouth at bedtime. 02/25/2017: States he is taking a whole tablet 40 mg at bedtime.  . cyclobenzaprine (FLEXERIL) 10 MG tablet Take 1 tablet (10 mg total) by mouth at bedtime. 02/25/2017: States he is taking 58m daily at night.    . dextromethorphan-guaiFENesin (MUCINEX DM) 30-600 MG 12hr tablet Take 1 tablet by mouth 2 (two) times daily as needed for cough.   . diltiazem (CARDIZEM) 30 MG tablet Take 1 tablet (30 mg total) by mouth every 12 (twelve) hours.   . feeding supplement, ENSURE ENLIVE, (ENSURE ENLIVE) LIQD Take 237 mLs by mouth 2 (two) times daily between meals.   . furosemide (LASIX) 40 MG tablet Take 2 tablets (80 mg total) by mouth daily as needed. (Patient taking differently: Take 80 mg by mouth daily as needed for fluid. )   . gabapentin (NEURONTIN) 300 MG capsule Take 1 capsule (300 mg total) by mouth 3 (three) times daily.   . hydrochlorothiazide (HYDRODIURIL) 25 MG tablet Take 1 tablet (25 mg total) by mouth daily.   . Oxycodone HCl 10 MG TABS Take 1 tablet (10 mg total) by mouth  every 6 (six) hours as needed.   . pantoprazole (PROTONIX) 40 MG tablet Take 1 tablet (40 mg total) by mouth daily.   . predniSONE (DELTASONE) 5 MG tablet TAKE 1 TABLET BY MOUTH EVERY DAY WITH BREAKFAST.   Marland Kitchen Probiotic Product (PROBIOTIC PO) Take 1 capsule by mouth daily.   . prochlorperazine (COMPAZINE) 10 MG tablet Take 1 tablet (10 mg total) by mouth every 6 (six) hours as needed for nausea or vomiting.   . traMADol (ULTRAM) 50 MG tablet Take 1 tablet (50 mg total) by mouth every 6 (six) hours as needed.   . zolpidem (AMBIEN) 10 MG tablet Take 1 tablet (10 mg total) by  mouth at bedtime as needed for sleep.   Marland Kitchen apixaban (ELIQUIS) 5 MG TABS tablet Take 1 tablet (5 mg total) by mouth 2 (two) times daily.    No facility-administered encounter medications on file as of 07/09/2017.     Functional Status:  In your present state of health, do you have any difficulty performing the following activities: 05/05/2017 04/16/2017  Hearing? Y N  Comment some hearing loss in right ear -  Vision? Y N  Comment waers glasses -  Difficulty concentrating or making decisions? N N  Walking or climbing stairs? Y Y  Comment - pain, unstable, and breathing  Dressing or bathing? N N  Doing errands, shopping? Y N  Comment he states he ha slimitattion -  Conservation officer, nature and eating ? N N  Using the Toilet? N N  In the past six months, have you accidently leaked urine? N N  Comment - -  Do you have problems with loss of bowel control? N N  Managing your Medications? N N  Managing your Finances? N N  Housekeeping or managing your Housekeeping? N N  Some recent data might be hidden    Fall/Depression Screening: Fall Risk  07/09/2017 06/08/2017 05/05/2017  Falls in the past year? Yes No No  Comment - - -  Number falls in past yr: 1 - -  Injury with Fall? No - -  Risk for fall due to : - - -  Risk for fall due to: Comment - - -   PHQ 2/9 Scores 05/05/2017 04/16/2017 02/12/2017 11/13/2015  PHQ - 2 Score 2 2 0 3  PHQ- 9 Score 7 5 - 6    Assessment: Patient continues to benefit from health coach outreach for disease management and support.   THN CM Care Plan Problem One     Most Recent Value  Care Plan Problem One  knowlege deficit [Knowledge Deficit]  Role Documenting the Problem One  [place] for Problem One  Active  THN Long Term Goal    patient will work on reduction of smoking a pack of cigarettes from every 3 days to a pack every 2 week in 90 days  [place] Term Goal Start Date  05/05/17  Interventions for Problem One [place]  reinterated with th patient the benefits of smoking cessation.Encouraged the patient to talk with his physician about inhalers and he and his wife working together to quit.  THN CM Short Term Goal #1   patient will be able to disscuss COPD action plan in 30 days  THN CM Short Term Goal #1 Start Date  05/05/17  [facility] CM Short Term Goal #1 Met Date  07/09/17  Interventions for Short Term Goal #1  RN Health Coach listened to the patient  as he was able to tell me signs and symptoms.  THN CM Short Term Goal #2   patient will be able to discuss the effects of smoking with copd and heart disease in 30 days  THN CM Short Term Goal #2 Start Date  05/05/17  Interventions for Short Term Goal #2  [place] did an reevaluation with the patient on how smoking affects his heart and breathing     Plan: [place] will contact patient in the month of January and patient agrees to next outreach.  Lazaro Arms RN, BSN, [place] Direct Dial:  [phone] Fax: [phone]

## 2017-07-28 ENCOUNTER — Other Ambulatory Visit: Payer: Self-pay | Admitting: Pharmacist

## 2017-07-28 NOTE — Patient Outreach (Signed)
[facility]) Care Management  07/28/2017  Scott Rollins 1951/05/08 [id number]   Teva Cares Program, [place] and AZ&ME programs were all called on the patient's behalf.    A new application needs to be completed for [name]--Eliquis  Teva-Proair--patient is eligible through 12/29/17  [place] patient is eligible through 01/09/18  Plan: Call patient back within 1-3 business days to restart the Eliquis application process and to complete a medication review.  Schedule a follow up visit for mid May to renew the Teva and [place] & Me applications.   [name], PharmD, [place] Clinical Pharmacist [phone]

## 2017-07-30 ENCOUNTER — Ambulatory Visit: Payer: Self-pay | Admitting: Pharmacist

## 2017-07-30 ENCOUNTER — Other Ambulatory Visit: Payer: Self-pay | Admitting: Pharmacist

## 2017-07-30 NOTE — Patient Outreach (Signed)
[facility]) Care Management  07/30/2017  Scott Rollins [birth year] [id number]    Called patient regarding medication assistance.  Unfortunately, he did not answer. HIPAA compliant message left on his voicemail.  From phone calls made earlier this week:  Teva Cares Program, [place] and AZ&ME programs were all called on the patient's behalf.    A new application needs to be completed for [name]--Eliquis  Teva-Proair--patient is eligible through 12/29/17  [place] patient is eligible through 01/09/18  Plan: Call patient back within 5-7 business days to restart the Eliquis application process and to complete a medication review.   [name], PharmD, [place] Clinical Pharmacist [phone]

## 2017-08-03 ENCOUNTER — Telehealth: Payer: Self-pay | Admitting: Oncology

## 2017-08-03 NOTE — Telephone Encounter (Signed)
Returned phone call after voicemail was left -b left voicemail with patient confirming appts.

## 2017-08-04 ENCOUNTER — Other Ambulatory Visit: Payer: Self-pay | Admitting: *Deleted

## 2017-08-04 DIAGNOSIS — C7951 Secondary malignant neoplasm of bone: Secondary | ICD-10-CM

## 2017-08-04 DIAGNOSIS — C61 Malignant neoplasm of prostate: Secondary | ICD-10-CM

## 2017-08-04 MED ORDER — PROCHLORPERAZINE MALEATE 10 MG PO TABS: 10.0000 mg | ORAL_TABLET | Freq: Four times a day (QID) | ORAL | 3 refills | Status: DC | PRN

## 2017-08-04 MED ORDER — OXYCODONE HCL 10 MG PO TABS: 10.0000 mg | ORAL_TABLET | Freq: Three times a day (TID) | ORAL | 0 refills | Status: DC | PRN

## 2017-08-05 ENCOUNTER — Ambulatory Visit: Payer: Self-pay | Admitting: Pharmacist

## 2017-08-05 ENCOUNTER — Other Ambulatory Visit: Payer: Self-pay | Admitting: Pharmacist

## 2017-08-05 NOTE — Patient Outreach (Signed)
[facility]) Care Management  08/05/2017  Scott Rollins 1950-09-20 [id number]   Patient was called regarding medication assistance. Unfortunately, he did not answer the phone.  This was the third unsuccessful call.  Plan: Send an unsuccessful contact letter.  Close patient's case if I do not hear back from him in 10 business days.   [name], PharmD, [place] Clinical Pharmacist [phone]

## 2017-08-10 ENCOUNTER — Other Ambulatory Visit: Payer: Self-pay

## 2017-08-10 NOTE — Patient Outreach (Signed)
[facility]) Care Management  [place]  08/10/2017   Scott Rollins 23-Jun-1951 [id number]  Subjective: Telephone call placed to the patient for a monthly assessment.  The patient states that he is doing well today.  Yesterday he was short of breath.  He denies and falls.  He states that he has some pain this morning that he rates around a 7/10.  He has taken medication and after a while he said it will be around a 4/10. He is adherent with his medications.  The patient is still smoking a pack of cigarettes a week. RN Health Coach talked with the patient about the benefits of smoking cessation. The patient verbalized understanding.  He states that he will work to reduce his smoking.  He knows that he feels better when he is not smoking.  The patient states that he needs to schedule an appointment with his pulmonologist and cardiologist.  He has an appointment with his primary care next month.  Objective:   Encounter Medications:  Outpatient Encounter Medications as of 08/10/2017  Medication Sig Note  . abiraterone acetate (ZYTIGA) 250 MG tablet Take 4 tablets (1,000 mg total) by mouth daily. Take on an empty stomach 1 hour before or 2 hours after a meal   . acetaminophen (TYLENOL) 500 MG tablet Take 2 tablets (1,000 mg total) by mouth 3 (three) times daily.   Marland Kitchen albuterol (PROAIR HFA) 108 (90 Base) MCG/ACT inhaler Inhale 2 puffs into the lungs every 6 (six) hours as needed for wheezing or shortness of breath.   Marland Kitchen albuterol (PROVENTIL) (2.5 MG/3ML) 0.083% nebulizer solution Take 3 mLs (2.5 mg total) by nebulization every 4 (four) hours as needed for wheezing or shortness of breath.   . allopurinol (ZYLOPRIM) 300 MG tablet Take 1 tablet (300 mg total) by mouth daily.   Marland Kitchen apixaban (ELIQUIS) 5 MG TABS tablet Take 1 tablet (5 mg total) by mouth 2 (two) times daily.   . bisoprolol-hydrochlorothiazide (ZIAC) 5-6.25 MG tablet Take 1 tablet by mouth every morning.   .  budesonide-formoterol (SYMBICORT) 160-4.5 MCG/ACT inhaler Inhale 2 puffs into the lungs 2 (two) times daily.   . citalopram (CELEXA) 40 MG tablet Take 0.5 tablets (20 mg total) by mouth at bedtime. 02/25/2017: States he is taking a whole tablet 40 mg at bedtime.  . cyclobenzaprine (FLEXERIL) 10 MG tablet Take 1 tablet (10 mg total) by mouth at bedtime. 02/25/2017: States he is taking 21m daily at night.    . dextromethorphan-guaiFENesin (MUCINEX DM) 30-600 MG 12hr tablet Take 1 tablet by mouth 2 (two) times daily as needed for cough.   . diltiazem (CARDIZEM) 30 MG tablet Take 1 tablet (30 mg total) by mouth every 12 (twelve) hours.   . feeding supplement, ENSURE ENLIVE, (ENSURE ENLIVE) LIQD Take 237 mLs by mouth 2 (two) times daily between meals.   . furosemide (LASIX) 40 MG tablet Take 2 tablets (80 mg total) by mouth daily as needed. (Patient taking differently: Take 80 mg by mouth daily as needed for fluid. )   . gabapentin (NEURONTIN) 300 MG capsule Take 1 capsule (300 mg total) by mouth 3 (three) times daily.   . hydrochlorothiazide (HYDRODIURIL) 25 MG tablet Take 1 tablet (25 mg total) by mouth daily.   . Oxycodone HCl 10 MG TABS Take 1 tablet (10 mg total) by mouth every 8 (eight) hours as needed.   . pantoprazole (PROTONIX) 40 MG tablet Take 1 tablet (40 mg total) by mouth daily.   .Marland Kitchen  predniSONE (DELTASONE) 5 MG tablet TAKE 1 TABLET BY MOUTH EVERY DAY WITH BREAKFAST.   Marland Kitchen Probiotic Product (PROBIOTIC PO) Take 1 capsule by mouth daily.   . prochlorperazine (COMPAZINE) 10 MG tablet Take 1 tablet (10 mg total) by mouth every 6 (six) hours as needed for nausea or vomiting.   . traMADol (ULTRAM) 50 MG tablet Take 1 tablet (50 mg total) by mouth every 6 (six) hours as needed.   . zolpidem (AMBIEN) 10 MG tablet Take 1 tablet (10 mg total) by mouth at bedtime as needed for sleep.    No facility-administered encounter medications on file as of 08/10/2017.     Functional Status:  In your present state  of health, do you have any difficulty performing the following activities: 05/05/2017 04/16/2017  Hearing? Y N  Comment some hearing loss in right ear -  Vision? Y N  Comment waers glasses -  Difficulty concentrating or making decisions? N N  Walking or climbing stairs? Y Y  Comment - pain, unstable, and breathing  Dressing or bathing? N N  Doing errands, shopping? Y N  Comment he states he ha slimitattion -  Conservation officer, nature and eating ? N N  Using the Toilet? N N  In the past six months, have you accidently leaked urine? N N  Comment - -  Do you have problems with loss of bowel control? N N  Managing your Medications? N N  Managing your Finances? N N  Housekeeping or managing your Housekeeping? N N  Some recent data might be hidden    Fall/Depression Screening: Fall Risk  08/10/2017 07/09/2017 06/08/2017  Falls in the past year? No Yes No  Comment - - -  Number falls in past yr: - 1 -  Injury with Fall? - No -  Risk for fall due to : - - -  Risk for fall due to: Comment - - -   PHQ 2/9 Scores 05/05/2017 04/16/2017 02/12/2017 11/13/2015  PHQ - 2 Score 2 2 0 3  PHQ- 9 Score 7 5 - 6    Assessment: Patient continues to benefit from health coach outreach for disease management and support.    THN CM Care Plan Problem One     Most Recent Value  Care Plan Problem One  knowlege deficit [Knowledge Deficit]  Role Documenting the Problem One  [place] for Problem One  Active  THN Long Term Goal   In 90 days the patient will verbalize that he has cut down his smoking from 1 pack of cigarettes a week to 1/2 pack a week  THN Long Term Goal Start Date  08/10/17  Interventions for Problem One [place] coach and the patient discussed how smoking cessation can improve his health.  The patient states that it is hard but would try to work on reducing his smoking gradually  THN CM Short Term Goal #1   patient will be able to disscuss COPD action plan in 30 days  THN  CM Short Term Goal #1 Start Date  05/05/17  [facility] CM Short Term Goal #1 Met Date  07/09/17  Interventions for Short Term Goal #1  RN Health Coach listened to the patient as he was able to tell me signs and symptoms.  THN CM Short Term Goal #2   patient will be able to discuss the effects of smoking with copd and heart disease in 30 days  THN CM Short Term Goal #  2 Start Date  05/05/17  [facility] CM Short Term Goal #2 Met Date  08/10/17  Interventions for Short Term Goal #2  [place] did an reevaluation with the patient on how smoking affects his heart and breathing     Plan: [place] will contact patient in the month of February and patient agrees to next outreach.

## 2017-08-10 NOTE — Patient Outreach (Deleted)
[place] [facility]) Care Management  08/10/2017  Scott Rollins 01/23/1951 [id number]

## 2017-08-12 ENCOUNTER — Other Ambulatory Visit: Payer: Self-pay | Admitting: *Deleted

## 2017-08-12 MED ORDER — ABIRATERONE ACETATE 250 MG PO TABS: 1000.0000 mg | ORAL_TABLET | Freq: Every day | ORAL | 0 refills | Status: DC

## 2017-08-13 ENCOUNTER — Inpatient Hospital Stay: Payer: Medicare Other

## 2017-08-13 ENCOUNTER — Inpatient Hospital Stay: Payer: Medicare Other | Attending: Oncology | Admitting: Oncology

## 2017-08-13 ENCOUNTER — Other Ambulatory Visit: Payer: Self-pay | Admitting: *Deleted

## 2017-08-13 ENCOUNTER — Telehealth: Payer: Self-pay | Admitting: Oncology

## 2017-08-13 VITALS — BP 157/77 | HR 72 | Temp 97.4°F | Resp 17 | Ht 70.0 in | Wt 279.1 lb

## 2017-08-13 DIAGNOSIS — C61 Malignant neoplasm of prostate: Secondary | ICD-10-CM | POA: Insufficient documentation

## 2017-08-13 DIAGNOSIS — C7951 Secondary malignant neoplasm of bone: Secondary | ICD-10-CM | POA: Insufficient documentation

## 2017-08-13 DIAGNOSIS — G893 Neoplasm related pain (acute) (chronic): Secondary | ICD-10-CM | POA: Diagnosis not present

## 2017-08-13 DIAGNOSIS — E291 Testicular hypofunction: Secondary | ICD-10-CM | POA: Diagnosis not present

## 2017-08-13 DIAGNOSIS — R5383 Other fatigue: Secondary | ICD-10-CM | POA: Diagnosis not present

## 2017-08-13 LAB — CBC WITH DIFFERENTIAL/PLATELET
Basophils Absolute: 0.1 10*3/uL (ref 0.0–0.1)
Basophils Relative: 1 %
EOS ABS: 0.3 10*3/uL (ref 0.0–0.5)
EOS PCT: 3 %
HCT: 46.4 % (ref 38.4–49.9)
Hemoglobin: 15.3 g/dL (ref 13.0–17.1)
LYMPHS ABS: 2.7 10*3/uL (ref 0.9–3.3)
LYMPHS PCT: 23 %
MCH: 29 pg (ref 27.2–33.4)
MCHC: 33 g/dL (ref 32.0–36.0)
MCV: 87.8 fL (ref 79.3–98.0)
MONO ABS: 0.6 10*3/uL (ref 0.1–0.9)
Monocytes Relative: 5 %
Neutro Abs: 8.3 10*3/uL — ABNORMAL HIGH (ref 1.5–6.5)
Neutrophils Relative %: 68 %
PLATELETS: 288 10*3/uL (ref 140–400)
RBC: 5.28 MIL/uL (ref 4.20–5.82)
RDW: 13.8 % (ref 11.0–14.6)
WBC: 12.1 10*3/uL — ABNORMAL HIGH (ref 4.0–10.3)

## 2017-08-13 LAB — COMPREHENSIVE METABOLIC PANEL
ALT: 10 U/L (ref 0–55)
ANION GAP: 10 (ref 3–11)
AST: 14 U/L (ref 5–34)
Albumin: 3.4 g/dL — ABNORMAL LOW (ref 3.5–5.0)
Alkaline Phosphatase: 115 U/L (ref 40–150)
BUN: 20 mg/dL (ref 7–26)
CHLORIDE: 100 mmol/L (ref 98–109)
CO2: 33 mmol/L — AB (ref 22–29)
Calcium: 9.4 mg/dL (ref 8.4–10.4)
Creatinine, Ser: 0.98 mg/dL (ref 0.70–1.30)
GFR calc non Af Amer: >60 mL/min (ref >60)
Glucose, Bld: 106 mg/dL (ref 70–140)
POTASSIUM: 4 mmol/L (ref 3.5–5.1)
SODIUM: 143 mmol/L (ref 136–145)
Total Bilirubin: 0.3 mg/dL (ref 0.2–1.2)
Total Protein: 6.9 g/dL (ref 6.4–8.3)

## 2017-08-13 MED ORDER — PROCHLORPERAZINE MALEATE 10 MG PO TABS: 10.0000 mg | ORAL_TABLET | Freq: Four times a day (QID) | ORAL | 3 refills | Status: DC | PRN

## 2017-08-13 MED ORDER — TRAMADOL HCL 50 MG PO TABS: 50.0000 mg | ORAL_TABLET | Freq: Four times a day (QID) | ORAL | 0 refills | Status: DC | PRN

## 2017-08-13 NOTE — Telephone Encounter (Signed)
Gave avs and calendar for march °

## 2017-08-13 NOTE — Progress Notes (Signed)
Hematology and Oncology Follow Up Visit  Scott Rollins [id number] 1951/07/08 67 y.o. 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  He was initially diagnosed in 2012 with Gleason score 4+4 = 8 and PSA of 4.91.  He developed metastatic disease in July 2018.  Prior Therapy:  He is status post external beam radiation after 4 months of androgen deprivation completed in 2012 as a definitive treatment.  Current therapy:  Lupron started in July 2018 initially at 7.5 mg monthly and will be switched to 30 mg every 4 months starting August 2018.  Zytiga and 1000 mg daily and 5 mg of prednisone. Started in August 2018.  Interim History:  Scott Rollins is here for a follow-up.  He reports no major changes since the last visit.  He reports intermittent bone pain that fluctuates between his shoulders, back and hips.  He experiences this pain periodically but not every day.  He does take Ultram and oxycodone to help his pain.  He takes oxycodone 3 times a day at times less.  For less than severe pain he takes Ultram.  This regimen has been effective for him in controlling his pain.  He is able to ambulate without any major difficulties.  He denies any falls or syncope.  He continues to take Zytiga without any new complications.  He denies any lower extremity edema but does report mild fatigue.  His fatigue has not changed at this time.  He has gained weight since the last visit.  He does not report any headaches or blurry vision, syncope or seizures. He does not report any fevers or chills or sweats. He does not report any cough, wheezing or hemoptysis. He does not report any chest pain, palpitation or leg edema. He does not report any nausea, vomiting or abdominal pain. He does not report any frequency urgency or hesitancy.  He denies any  pathological fractures or joint pain.  He denies any skin rashes or lesions.  He denies any lymphadenopathy, petechiae or easy bruising.  He denies any cold or heat intolerance.  He denies any anxiety or depression.  Remaining review of systems is negative.   Medications: I have reviewed the patient's current medications.  Current Outpatient Medications  Medication Sig Dispense Refill  . abiraterone acetate (ZYTIGA) 250 MG tablet Take 4 tablets (1,000 mg total) by mouth daily. Take on an empty stomach 1 hour before or 2 hours after a meal 120 tablet 0  . acetaminophen (TYLENOL) 500 MG tablet Take 2 tablets (1,000 mg total) by mouth 3 (three) times daily. 30 tablet 0  . albuterol (PROAIR HFA) 108 (90 Base) MCG/ACT inhaler Inhale 2 puffs into the lungs every 6 (six) hours as needed for wheezing or shortness of breath.    Marland Kitchen albuterol (PROVENTIL) (2.5 MG/3ML) 0.083% nebulizer solution Take 3 mLs (2.5 mg total) by nebulization every 4 (four) hours as needed for wheezing or shortness of breath. 75 mL 3  . allopurinol (ZYLOPRIM) 300 MG tablet Take 1 tablet (300 mg total) by mouth daily. 90 tablet 3  . apixaban (ELIQUIS) 5 MG TABS tablet Take 1 tablet (5 mg total) by mouth 2 (two) times daily. 180 tablet 3  . bisoprolol-hydrochlorothiazide (ZIAC) 5-6.25 MG tablet Take 1 tablet by mouth every morning. 90 tablet 3  . budesonide-formoterol (SYMBICORT) 160-4.5 MCG/ACT inhaler Inhale 2 puffs into the lungs 2 (  two) times daily. 3 Inhaler 3  . citalopram (CELEXA) 40 MG tablet Take 0.5 tablets (20 mg total) by mouth at bedtime. 90 tablet 3  . cyclobenzaprine (FLEXERIL) 10 MG tablet Take 1 tablet (10 mg total) by mouth at bedtime. 90 tablet 1  . dextromethorphan-guaiFENesin (MUCINEX DM) 30-600 MG 12hr tablet Take 1 tablet by mouth 2 (two) times daily as needed for cough.    . diltiazem (CARDIZEM) 30 MG tablet Take 1 tablet (30 mg total) by mouth every 12 (twelve) hours. 60 tablet 11  . feeding supplement, ENSURE  ENLIVE, (ENSURE ENLIVE) LIQD Take 237 mLs by mouth 2 (two) times daily between meals. 237 mL 12  . furosemide (LASIX) 40 MG tablet Take 2 tablets (80 mg total) by mouth daily as needed. (Patient taking differently: Take 80 mg by mouth daily as needed for fluid. ) 180 tablet 1  . gabapentin (NEURONTIN) 300 MG capsule Take 1 capsule (300 mg total) by mouth 3 (three) times daily. 90 capsule 6  . hydrochlorothiazide (HYDRODIURIL) 25 MG tablet Take 1 tablet (25 mg total) by mouth daily. 30 tablet 6  . Oxycodone HCl 10 MG TABS Take 1 tablet (10 mg total) by mouth every 8 (eight) hours as needed. 90 tablet 0  . pantoprazole (PROTONIX) 40 MG tablet Take 1 tablet (40 mg total) by mouth daily. 30 tablet 6  . predniSONE (DELTASONE) 5 MG tablet TAKE 1 TABLET BY MOUTH EVERY DAY WITH BREAKFAST. 30 tablet 2  . Probiotic Product (PROBIOTIC PO) Take 1 capsule by mouth daily.    Marland Kitchen zolpidem (AMBIEN) 10 MG tablet Take 1 tablet (10 mg total) by mouth at bedtime as needed for sleep. 30 tablet 5  . prochlorperazine (COMPAZINE) 10 MG tablet Take 1 tablet (10 mg total) by mouth every 6 (six) hours as needed for nausea or vomiting. 30 tablet 3  . traMADol (ULTRAM) 50 MG tablet Take 1 tablet (50 mg total) by mouth every 6 (six) hours as needed. 60 tablet 0   No current facility-administered medications for this visit.      Allergies:  Allergies  Allergen Reactions  . Amitriptyline Hcl Nausea Only    REACTION: nausea, elevated bp  . Ezetimibe Other (See Comments)    REACTION: chest pain, fatigue    Past Medical History, Surgical history, Social history, and Family History is unchanged from previous examinations.   Physical Exam: Blood pressure (!) 157/77, pulse 72, temperature (!) 97.4 F (36.3 C), temperature source Oral, resp. rate 17, height 5\' 10"  (1.778 m), weight 279 lb 1.6 oz (126.6 kg), SpO2 95 %. ECOG: 1 General appearance: Alert, awake gentleman without distress. Head: Normocephalic, without obvious  abnormality  Oropharynx: Without thrush or ulcers. Eyes: No scleral icterus. Lymph nodes: Cervical, supraclavicular, and axillary nodes normal. Heart:regular rate and rhythm, S1, S2 normal, no murmur, click, rub or gallop Lung:chest clear to auscultation without wheezing or dullness to percussion. Abdomin: Soft with good bowel sounds in all 4 quadrants.  No rebound or guarding.  No shifting dullness. Musculoskeletal: No joint deformity or effusion. Skin: No rashes, lesions or ecchymosis. Neurological: No deficits motor, sensory or deep tendon reflexes.   Lab Results: Lab Results  Component Value Date   WBC 12.1 (H) 08/13/2017   HGB 15.3 08/13/2017   HCT 46.4 08/13/2017   MCV 87.8 08/13/2017   PLT 288 08/13/2017     Chemistry      Component Value Date/Time   NA 143 08/13/2017 1505   NA 141  06/26/2017 1444   K 4.0 08/13/2017 1505   K 4.3 06/26/2017 1444   CL 100 08/13/2017 1505   CO2 33 (H) 08/13/2017 1505   CO2 30 (H) 06/26/2017 1444   BUN 20 08/13/2017 1505   BUN 18.0 06/26/2017 1444   CREATININE 0.98 08/13/2017 1505   CREATININE 1.0 06/26/2017 1444      Component Value Date/Time   CALCIUM 9.4 08/13/2017 1505   CALCIUM 9.4 06/26/2017 1444   ALKPHOS 115 08/13/2017 1505   ALKPHOS 103 06/26/2017 1444   AST 14 08/13/2017 1505   AST 20 06/26/2017 1444   ALT 10 08/13/2017 1505   ALT 21 06/26/2017 1444   BILITOT 0.3 08/13/2017 1505   BILITOT 0.47 06/26/2017 1444      Results for Myrick, Real P "BOB" (MRN [id number]) as of 08/13/2017 15:38  Ref. Range 03/25/2017 15:04 05/07/2017 14:45 06/26/2017 14:44  Prostate Specific Ag, Serum Latest Ref Range: 0.0 - 4.0 ng/mL 8.5 (H) 4.2 (H) 3.6      Impression and Plan:   67 year old gentleman with the following issues:  1.  Advanced prostate cancer documented in July 2018.  He has bone disease and lymphadenopathy.  His initial diagnosis was in 2012 with  Gleason score 4+4 = 8 and a PSA of 4.91.   He is currently  receiving Zytiga 1000 mg daily with prednisone in addition to Lupron.  He continues to tolerate this therapy without complications.  His PSA continues to show excellent response with PSA continues to decline currently at 3.6.  The natural course of this disease was reviewed again with the patient.  He understands he has an incurable malignancy that can be palliated with the current treatment approach for an extended period of time.  He understands eventually he will develop progression of disease and different salvage therapy may be needed.  These options would include different hormonal agents and likely systemic chemotherapy.  For the time being, he is agreeable to continue with Zytiga without any dose reduction or modification.  2. Androgen deprivation therapy: I recommended continuing Lupron indefinitely.  He will receive 30 mg every 4 months last received on June 26, 2017.  3. Bone directed therapy: Risks and benefits Delton See was discussed and this is deferred for the time being until he obtains a dental clearance.  He will continue on calcium and vitamin D.  Complication associated with this medication including osteonecrosis of the jaw and hypocalcemia were also reviewed.  4. Pain: His pain is predominantly in the bone and related to prostate cancer.  It is currently manageable with oxycodone at 10 mg 3 times a day with as needed Ultram.  5. Nausea: Compazine has been very effective in controlling his nausea.  This was refilled for him today with instruction how to use it.  6.  Prognosis and goals of care: He understands he has an incurable malignancy but his performance status is excellent and aggressive therapy is warranted at this time.  7. Follow-up: Will be in 7 weeks to follow his progress.  25  minutes was spent with the patient face-to-face today.  More than 50% of time was dedicated to patient counseling, education and answering his questions regarding diagnosis, prognosis and  future plans of care.    Zola Button, MD 1/31/20194:08 PM

## 2017-08-14 ENCOUNTER — Telehealth: Payer: Self-pay | Admitting: *Deleted

## 2017-08-14 LAB — PROSTATE-SPECIFIC AG, SERUM (LABCORP): Prostate Specific Ag, Serum: 2.8 ng/mL (ref 0.0–4.0)

## 2017-08-14 NOTE — Telephone Encounter (Signed)
Lm on patient's cell phone.

## 2017-08-14 NOTE — Telephone Encounter (Signed)
-----   Message from Wyatt Portela, MD sent at 08/14/2017  8:20 AM EST ----- Please let him know his PSA is lower now.

## 2017-08-17 ENCOUNTER — Telehealth: Payer: Self-pay | Admitting: *Deleted

## 2017-08-17 ENCOUNTER — Other Ambulatory Visit: Payer: Self-pay | Admitting: *Deleted

## 2017-08-17 DIAGNOSIS — C61 Malignant neoplasm of prostate: Secondary | ICD-10-CM

## 2017-08-17 MED ORDER — ABIRATERONE ACETATE 250 MG PO TABS: 1000.0000 mg | ORAL_TABLET | Freq: Every day | ORAL | 0 refills | Status: DC

## 2017-08-17 NOTE — Telephone Encounter (Signed)
Patient returned phone call regarding PSA results. Results given. He verbalized understanding.

## 2017-08-21 ENCOUNTER — Ambulatory Visit: Payer: Self-pay | Admitting: Pharmacist

## 2017-08-24 ENCOUNTER — Ambulatory Visit: Payer: Self-pay | Admitting: Pharmacist

## 2017-08-24 ENCOUNTER — Other Ambulatory Visit: Payer: Self-pay | Admitting: Pharmacist

## 2017-08-24 NOTE — Patient Outreach (Signed)
[place] [facility]) Care Management  08/24/2017  Scott Rollins 1950/08/18 [id number]    Patient was called regarding medication assistance. Unfortunately, he did not answer the phone.  This was the third unsuccessful call.  Plan: Close patient's case Alert Healthcoach as she is still involved with patient.  [name], PharmD, [place] Clinical Pharmacist [phone]

## 2017-09-01 ENCOUNTER — Encounter: Payer: Self-pay | Admitting: Family Medicine

## 2017-09-10 ENCOUNTER — Other Ambulatory Visit: Payer: Self-pay

## 2017-09-10 NOTE — Patient Outreach (Signed)
[place] [facility]) Care Management  [place]  09/10/2017   Scott Rollins 02-27-1951 [id number]  HIPAA verified Subjective: Telephone call placed to the patient for monthly assessment. HIPAA verified .  The patient states that he is getting a upper respiratory infection.  He states that it is being passed around to the family members.  RN Health Coach talked with the patient about hand hygiene. The patient verbalized understanding.  The patient states that he is in chronic pain and rates the pain at a 6/10 but has medications to help.  The patient denies any falls or shortness of breath. He states that he weighs himself and monitors for swelling.  He is adherent with his medications.  He states that he is still smoking but has reduced to just a pack a week.  RN Health Coach encouraged the patient to continue on working to quit smoking.  He understand the benefits to quitting smoking.  He has an appointment with his oncologist at the end of March.   Encounter Medications:  Outpatient Encounter Medications as of 09/10/2017  Medication Sig Note  . abiraterone acetate (ZYTIGA) 250 MG tablet Take 4 tablets (1,000 mg total) by mouth daily. Take on an empty stomach 1 hour before or 2 hours after a meal   . acetaminophen (TYLENOL) 500 MG tablet Take 2 tablets (1,000 mg total) by mouth 3 (three) times daily.   Marland Kitchen albuterol (PROAIR HFA) 108 (90 Base) MCG/ACT inhaler Inhale 2 puffs into the lungs every 6 (six) hours as needed for wheezing or shortness of breath.   Marland Kitchen albuterol (PROVENTIL) (2.5 MG/3ML) 0.083% nebulizer solution Take 3 mLs (2.5 mg total) by nebulization every 4 (four) hours as needed for wheezing or shortness of breath.   . allopurinol (ZYLOPRIM) 300 MG tablet Take 1 tablet (300 mg total) by mouth daily.   Marland Kitchen apixaban (ELIQUIS) 5 MG TABS tablet Take 1 tablet (5 mg total) by mouth 2 (two) times daily.   . bisoprolol-hydrochlorothiazide (ZIAC) 5-6.25 MG tablet Take 1 tablet  by mouth every morning.   . budesonide-formoterol (SYMBICORT) 160-4.5 MCG/ACT inhaler Inhale 2 puffs into the lungs 2 (two) times daily.   . citalopram (CELEXA) 40 MG tablet Take 0.5 tablets (20 mg total) by mouth at bedtime. 02/25/2017: States he is taking a whole tablet 40 mg at bedtime.  . cyclobenzaprine (FLEXERIL) 10 MG tablet Take 1 tablet (10 mg total) by mouth at bedtime. 02/25/2017: States he is taking 10mg  daily at night.    . dextromethorphan-guaiFENesin (MUCINEX DM) 30-600 MG 12hr tablet Take 1 tablet by mouth 2 (two) times daily as needed for cough.   . diltiazem (CARDIZEM) 30 MG tablet Take 1 tablet (30 mg total) by mouth every 12 (twelve) hours.   . feeding supplement, ENSURE ENLIVE, (ENSURE ENLIVE) LIQD Take 237 mLs by mouth 2 (two) times daily between meals.   . furosemide (LASIX) 40 MG tablet Take 2 tablets (80 mg total) by mouth daily as needed. (Patient taking differently: Take 80 mg by mouth daily as needed for fluid. )   . gabapentin (NEURONTIN) 300 MG capsule Take 1 capsule (300 mg total) by mouth 3 (three) times daily.   . hydrochlorothiazide (HYDRODIURIL) 25 MG tablet Take 1 tablet (25 mg total) by mouth daily.   . Oxycodone HCl 10 MG TABS Take 1 tablet (10 mg total) by mouth every 8 (eight) hours as needed.   . pantoprazole (PROTONIX) 40 MG tablet Take 1 tablet (40 mg total)  by mouth daily.   . predniSONE (DELTASONE) 5 MG tablet TAKE 1 TABLET BY MOUTH EVERY DAY WITH BREAKFAST.   Marland Kitchen Probiotic Product (PROBIOTIC PO) Take 1 capsule by mouth daily.   . prochlorperazine (COMPAZINE) 10 MG tablet Take 1 tablet (10 mg total) by mouth every 6 (six) hours as needed for nausea or vomiting.   . traMADol (ULTRAM) 50 MG tablet Take 1 tablet (50 mg total) by mouth every 6 (six) hours as needed.   . zolpidem (AMBIEN) 10 MG tablet Take 1 tablet (10 mg total) by mouth at bedtime as needed for sleep.    No facility-administered encounter medications on file as of 09/10/2017.     Functional  Status:  In your present state of health, do you have any difficulty performing the following activities: 05/05/2017 04/16/2017  Hearing? Y N  Comment some hearing loss in right ear -  Vision? Y N  Comment waers glasses -  Difficulty concentrating or making decisions? N N  Walking or climbing stairs? Y Y  Comment - pain, unstable, and breathing  Dressing or bathing? N N  Doing errands, shopping? Y N  Comment he states he ha slimitattion -  Conservation officer, nature and eating ? N N  Using the Toilet? N N  In the past six months, have you accidently leaked urine? N N  Comment - -  Do you have problems with loss of bowel control? N N  Managing your Medications? N N  Managing your Finances? N N  Housekeeping or managing your Housekeeping? N N  Some recent data might be hidden    Fall/Depression Screening: Fall Risk  09/10/2017 08/10/2017 07/09/2017  Falls in the past year? No No Yes  Comment - - -  Number falls in past yr: - - 1  Injury with Fall? - - No  Risk for fall due to : - - -  Risk for fall due to: Comment - - -   PHQ 2/9 Scores 05/05/2017 04/16/2017 02/12/2017 11/13/2015  PHQ - 2 Score 2 2 0 3  PHQ- 9 Score 7 5 - 6    Assessment: Patient continues to benefit from health coach outreach for disease management and support.    THN CM Care Plan Problem One     Most Recent Value  THN Long Term Goal   In 90 days the patient will verbalize that he has cut down his smoking from 1 pack of cigarettes a week to 1/2 pack a week  THN Long Term Goal Start Date  09/10/17  Interventions for Problem One [place] reinterated to the patient the benefits of quitting smoking he states that he has cut backmor by smoking 1 pack per week.     Plan:  RN Health Coach will contact patient in the month of March and patient agrees to next outreach.  Lazaro Arms RN, BSN, [place] Direct Dial:  [phone] Fax:  [phone]

## 2017-09-11 ENCOUNTER — Telehealth: Payer: Self-pay | Admitting: *Deleted

## 2017-09-11 ENCOUNTER — Other Ambulatory Visit: Payer: Self-pay | Admitting: *Deleted

## 2017-09-11 ENCOUNTER — Encounter: Payer: Self-pay | Admitting: *Deleted

## 2017-09-11 DIAGNOSIS — C7951 Secondary malignant neoplasm of bone: Principal | ICD-10-CM

## 2017-09-11 DIAGNOSIS — C61 Malignant neoplasm of prostate: Secondary | ICD-10-CM

## 2017-09-11 MED ORDER — OXYCODONE HCL 10 MG PO TABS
10.0000 mg | ORAL_TABLET | Freq: Three times a day (TID) | ORAL | 0 refills | Status: DC | PRN
Start: 2017-09-11 — End: 2017-09-29

## 2017-09-11 NOTE — Telephone Encounter (Signed)
Patient calling for a refill on oxycodone. Signed script left at front for patient p/u.

## 2017-09-18 ENCOUNTER — Other Ambulatory Visit: Payer: Self-pay | Admitting: *Deleted

## 2017-09-18 DIAGNOSIS — C61 Malignant neoplasm of prostate: Secondary | ICD-10-CM

## 2017-09-18 MED ORDER — ABIRATERONE ACETATE 250 MG PO TABS: 1000.0000 mg | ORAL_TABLET | Freq: Every day | ORAL | 0 refills | Status: DC

## 2017-09-24 ENCOUNTER — Other Ambulatory Visit: Payer: Self-pay | Admitting: Pharmacist

## 2017-09-25 ENCOUNTER — Encounter: Payer: Self-pay | Admitting: Pharmacist

## 2017-09-25 NOTE — Patient Outreach (Signed)
[facility]) Care Management  [place]   09/25/2017  Scott Rollins 05-Jul-1951 [id number]  Subjective: Patient was called regarding medication assistance. HIPAA identifiers were obtained.  Patient is a 67 year old male with multiple medical conditions including but not limited to:  DJD, HTN, Afib, hyperlipidemia, OSA, Prostate Cancer, and COPD.      Patient manages his own medications. He does not have a Medicare Part D plan due to finances and would have to pay a fine for every year he was eligible and did not enroll.   Objective:   Encounter Medications: Outpatient Encounter Medications as of 09/24/2017  Medication Sig Note  . abiraterone acetate (ZYTIGA) 250 MG tablet Take 4 tablets (1,000 mg total) by mouth daily. Take on an empty stomach 1 hour before or 2 hours after a meal   . acetaminophen (TYLENOL) 500 MG tablet Take 2 tablets (1,000 mg total) by mouth 3 (three) times daily.   Marland Kitchen albuterol (PROAIR HFA) 108 (90 Base) MCG/ACT inhaler Inhale 2 puffs into the lungs every 6 (six) hours as needed for wheezing or shortness of breath.   Marland Kitchen albuterol (PROVENTIL) (2.5 MG/3ML) 0.083% nebulizer solution Take 3 mLs (2.5 mg total) by nebulization every 4 (four) hours as needed for wheezing or shortness of breath.   . allopurinol (ZYLOPRIM) 300 MG tablet Take 1 tablet (300 mg total) by mouth daily.   Marland Kitchen apixaban (ELIQUIS) 5 MG TABS tablet Take 1 tablet (5 mg total) by mouth 2 (two) times daily.   . bisoprolol-hydrochlorothiazide (ZIAC) 5-6.25 MG tablet Take 1 tablet by mouth every morning.   . budesonide-formoterol (SYMBICORT) 160-4.5 MCG/ACT inhaler Inhale 2 puffs into the lungs 2 (two) times daily.   . citalopram (CELEXA) 40 MG tablet Take 0.5 tablets (20 mg total) by mouth at bedtime. 02/25/2017: States he is taking a whole tablet 40 mg at bedtime.  . cyclobenzaprine (FLEXERIL) 10 MG tablet Take 1 tablet (10 mg total) by mouth at bedtime. 02/25/2017: States he is  taking 10mg  daily at night.    . dextromethorphan-guaiFENesin (MUCINEX DM) 30-600 MG 12hr tablet Take 1 tablet by mouth 2 (two) times daily as needed for cough.   . diltiazem (CARDIZEM) 30 MG tablet Take 1 tablet (30 mg total) by mouth every 12 (twelve) hours.   . feeding supplement, ENSURE ENLIVE, (ENSURE ENLIVE) LIQD Take 237 mLs by mouth 2 (two) times daily between meals.   . furosemide (LASIX) 40 MG tablet Take 2 tablets (80 mg total) by mouth daily as needed. (Patient taking differently: Take 80 mg by mouth daily as needed for fluid. )   . gabapentin (NEURONTIN) 300 MG capsule Take 1 capsule (300 mg total) by mouth 3 (three) times daily. 09/24/2017: Patient only taking twice a day due to feeling drowsy  . hydrochlorothiazide (HYDRODIURIL) 25 MG tablet Take 1 tablet (25 mg total) by mouth daily.   Marland Kitchen ibuprofen (ADVIL,MOTRIN) 200 MG tablet Take 400 mg by mouth every 8 (eight) hours as needed for moderate pain.   . Oxycodone HCl 10 MG TABS Take 1 tablet (10 mg total) by mouth every 8 (eight) hours as needed.   . pantoprazole (PROTONIX) 40 MG tablet Take 1 tablet (40 mg total) by mouth daily.   . predniSONE (DELTASONE) 5 MG tablet TAKE 1 TABLET BY MOUTH EVERY DAY WITH BREAKFAST.   Marland Kitchen Probiotic Product (PROBIOTIC PO) Take 1 capsule by mouth daily.   . prochlorperazine (COMPAZINE) 10 MG tablet Take 1 tablet (10  mg total) by mouth every 6 (six) hours as needed for nausea or vomiting.   . traMADol (ULTRAM) 50 MG tablet Take 1 tablet (50 mg total) by mouth every 6 (six) hours as needed.   . zolpidem (AMBIEN) 10 MG tablet Take 1 tablet (10 mg total) by mouth at bedtime as needed for sleep.    No facility-administered encounter medications on file as of 09/24/2017.     Functional Status: In your present state of health, do you have any difficulty performing the following activities: 05/05/2017 04/16/2017  Hearing? Y N  Comment some hearing loss in right ear -  Vision? Y N  Comment waers glasses -   Difficulty concentrating or making decisions? N N  Walking or climbing stairs? Y Y  Comment - pain, unstable, and breathing  Dressing or bathing? N N  Doing errands, shopping? Y N  Comment he states he ha slimitattion -  Conservation officer, nature and eating ? N N  Using the Toilet? N N  In the past six months, have you accidently leaked urine? N N  Comment - -  Do you have problems with loss of bowel control? N N  Managing your Medications? N N  Managing your Finances? N N  Housekeeping or managing your Housekeeping? N N  Some recent data might be hidden    Fall/Depression Screening: Fall Risk  09/10/2017 08/10/2017 07/09/2017  Falls in the past year? No No Yes  Comment - - -  Number falls in past yr: - - 1  Injury with Fall? - - No  Risk for fall due to : - - -  Risk for fall due to: Comment - - -   PHQ 2/9 Scores 05/05/2017 04/16/2017 02/12/2017 11/13/2015  PHQ - 2 Score 2 2 0 3  PHQ- 9 Score 7 5 - 6      Assessment:  Patient's medications were reviewed with him via telephone.  Medication Assistance Findings:   [employer] Program, [employer] and [place] programs were all called on the patient's behalf on 07/28/17.  A new application needs to be completed for [employer]--Eliquis  [employer]eva-Proair--patient is eligible through 12/29/17  [place] patient is eligible through 01/09/18  Since patient does not have Medicare Part D, he cannot apply for the Extra Help Program.  Patient's application should be processed as if he is uninsured.   Plan: Letter will be routed to Heywood Bene, [place] Technician to send the patient an application for [employer] (Eliquis). Follow up on patient and the application in 2 weeks.  [name], PharmD, [place] Clinical Pharmacist [phone]

## 2017-09-28 ENCOUNTER — Other Ambulatory Visit: Payer: Self-pay | Admitting: Pharmacy Technician

## 2017-09-28 ENCOUNTER — Other Ambulatory Visit: Payer: Self-pay | Admitting: Family Medicine

## 2017-09-28 ENCOUNTER — Encounter: Payer: Self-pay | Admitting: Family Medicine

## 2017-09-28 MED ORDER — APIXABAN 5 MG PO TABS: 5.0000 mg | ORAL_TABLET | Freq: Two times a day (BID) | ORAL | 1 refills | Status: AC

## 2017-09-28 NOTE — Patient Outreach (Signed)
[facility]) Care Management  09/28/2017  Scott Rollins June 25, 1951 [id number]   Received [name] patient assistance referral from Pharmacist Denyse Amass. Mailed out patient portion of application to patient on 03/18. Faxed provider portion to Dr. Shawnie Dapper on 03/18.  Maud Deed [place], [place] Management [phone]

## 2017-09-29 ENCOUNTER — Inpatient Hospital Stay: Payer: Medicare Other | Attending: Oncology | Admitting: Oncology

## 2017-09-29 ENCOUNTER — Inpatient Hospital Stay: Payer: Medicare Other

## 2017-09-29 VITALS — BP 165/81 | HR 86 | Temp 98.4°F | Resp 24 | Ht 70.0 in | Wt 274.5 lb

## 2017-09-29 DIAGNOSIS — E291 Testicular hypofunction: Secondary | ICD-10-CM | POA: Diagnosis not present

## 2017-09-29 DIAGNOSIS — J441 Chronic obstructive pulmonary disease with (acute) exacerbation: Secondary | ICD-10-CM | POA: Insufficient documentation

## 2017-09-29 DIAGNOSIS — C61 Malignant neoplasm of prostate: Secondary | ICD-10-CM | POA: Diagnosis not present

## 2017-09-29 DIAGNOSIS — R52 Pain, unspecified: Secondary | ICD-10-CM | POA: Insufficient documentation

## 2017-09-29 DIAGNOSIS — J449 Chronic obstructive pulmonary disease, unspecified: Secondary | ICD-10-CM

## 2017-09-29 DIAGNOSIS — C7951 Secondary malignant neoplasm of bone: Secondary | ICD-10-CM | POA: Insufficient documentation

## 2017-09-29 LAB — CMP (CANCER CENTER ONLY)
ALT: 16 U/L (ref 0–55)
ANION GAP: 11 (ref 3–11)
AST: 17 U/L (ref 5–34)
Albumin: 3.4 g/dL — ABNORMAL LOW (ref 3.5–5.0)
Alkaline Phosphatase: 86 U/L (ref 40–150)
BUN: 18 mg/dL (ref 7–26)
CALCIUM: 9.7 mg/dL (ref 8.4–10.4)
CHLORIDE: 91 mmol/L — AB (ref 98–109)
CO2: 39 mmol/L — AB (ref 22–29)
Creatinine: 0.92 mg/dL (ref 0.70–1.30)
GFR, Estimated: >60 mL/min (ref >60)
Glucose, Bld: 115 mg/dL (ref 70–140)
Potassium: 3.1 mmol/L — ABNORMAL LOW (ref 3.5–5.1)
SODIUM: 141 mmol/L (ref 136–145)
Total Bilirubin: 0.6 mg/dL (ref 0.2–1.2)
Total Protein: 6.9 g/dL (ref 6.4–8.3)

## 2017-09-29 LAB — CBC WITH DIFFERENTIAL (CANCER CENTER ONLY)
Basophils Absolute: 0.1 10*3/uL (ref 0.0–0.1)
Basophils Relative: 1 %
EOS ABS: 0.2 10*3/uL (ref 0.0–0.5)
EOS PCT: 1 %
HCT: 46.7 % (ref 38.4–49.9)
Hemoglobin: 15.5 g/dL (ref 13.0–17.1)
LYMPHS ABS: 1.4 10*3/uL (ref 0.9–3.3)
Lymphocytes Relative: 10 %
MCH: 29.4 pg (ref 27.2–33.4)
MCHC: 33.1 g/dL (ref 32.0–36.0)
MCV: 88.8 fL (ref 79.3–98.0)
MONO ABS: 0.5 10*3/uL (ref 0.1–0.9)
MONOS PCT: 4 %
Neutro Abs: 11.1 10*3/uL — ABNORMAL HIGH (ref 1.5–6.5)
Neutrophils Relative %: 84 %
PLATELETS: 298 10*3/uL (ref 140–400)
RBC: 5.25 MIL/uL (ref 4.20–5.82)
RDW: 13.9 % (ref 11.0–14.6)
WBC: 13.2 10*3/uL — AB (ref 4.0–10.3)

## 2017-09-29 MED ORDER — OXYCODONE HCL 10 MG PO TABS: 10.0000 mg | ORAL_TABLET | Freq: Three times a day (TID) | ORAL | 0 refills | Status: DC | PRN

## 2017-09-29 MED ORDER — TRAMADOL HCL 50 MG PO TABS: 50.0000 mg | ORAL_TABLET | Freq: Four times a day (QID) | ORAL | 1 refills | Status: DC | PRN

## 2017-09-29 NOTE — Progress Notes (Signed)
Hematology and Oncology Follow Up Visit  Scott Rollins [id number] [birth year] 67 y.o. 09/29/2017 3:16 PM [name], [name], MDMcGowen, [name], MD   Principle Diagnosis: 67 year old man with advanced hormone sensitive metastatic prostate cancer diagnosed in 2012.  He developed advanced disease in July 2018 with lymphadenopathy.  He presented and 2012 with Gleason score 4+4 = 8 and PSA of 4.91.    Prior Therapy:  He is status post external beam radiation after 4 months of androgen deprivation completed in 2012 as a definitive treatment.  Current therapy:  Lupron started in July 2018 initially at 7.5 mg monthly and will be switched to 30 mg every 4 months starting August 2018.  Zytiga and 1000 mg daily and 5 mg of prednisone. Started in August 2018.  Interim History:  Mr. Nordahl presents today for a follow-up.  He reports no major changes in his health but does report diffuse symptoms.  He continues to have issues with fatigue that is chronic in nature and has not dramatically changed.  He also has reported issues with dyspnea and exertion and COPD exacerbation at times.  He does use inhalers as well as CPAP for his sleep apnea.  Has not seen a pulmonary medicine recently.  He continues to take Zytiga and has not reported new complications.  He denies any new pain or pathological fractures.  His bone pain has been chronic and unchanged.  He does not report any headaches or blurry vision, syncope or seizures. He does not report any fevers or chills or sweats. He does not report any  wheezing or hemoptysis. He does not report any chest pain, palpitation or leg edema. He does not report any nausea, vomiting or abdominal pain.  He denies any hematochezia or melena.  He does not report any frequency urgency or hesitancy.  He denies any pathological fractures or joint pain.  He denies any skin rashes or lesions.  He denies any lymphadenopathy, petechiae or easy bruising.   He denies any anxiety  or depression.  Remaining review of systems is negative.   Medications: I have reviewed the patient's current medications.  Current Outpatient Medications  Medication Sig Dispense Refill  . abiraterone acetate (ZYTIGA) 250 MG tablet Take 4 tablets (1,000 mg total) by mouth daily. Take on an empty stomach 1 hour before or 2 hours after a meal 120 tablet 0  . acetaminophen (TYLENOL) 500 MG tablet Take 2 tablets (1,000 mg total) by mouth 3 (three) times daily. 30 tablet 0  . albuterol (PROAIR HFA) 108 (90 Base) MCG/ACT inhaler Inhale 2 puffs into the lungs every 6 (six) hours as needed for wheezing or shortness of breath.    Marland Kitchen albuterol (PROVENTIL) (2.5 MG/3ML) 0.083% nebulizer solution Take 3 mLs (2.5 mg total) by nebulization every 4 (four) hours as needed for wheezing or shortness of breath. 75 mL 3  . allopurinol (ZYLOPRIM) 300 MG tablet Take 1 tablet (300 mg total) by mouth daily. 90 tablet 3  . apixaban (ELIQUIS) 5 MG TABS tablet Take 1 tablet (5 mg total) by mouth 2 (two) times daily. 180 tablet 1  . bisoprolol-hydrochlorothiazide (ZIAC) 5-6.25 MG tablet Take 1 tablet by mouth every morning. 90 tablet 3  . budesonide-formoterol (SYMBICORT) 160-4.5 MCG/ACT inhaler Inhale 2 puffs into the lungs 2 (two) times daily. 3 Inhaler 3  . citalopram (CELEXA) 40 MG tablet Take 0.5 tablets (20 mg total) by mouth at bedtime. 90 tablet 3  . cyclobenzaprine (FLEXERIL) 10 MG tablet Take 1 tablet (  10 mg total) by mouth at bedtime. 90 tablet 1  . dextromethorphan-guaiFENesin (MUCINEX DM) 30-600 MG 12hr tablet Take 1 tablet by mouth 2 (two) times daily as needed for cough.    . diltiazem (CARDIZEM) 30 MG tablet Take 1 tablet (30 mg total) by mouth every 12 (twelve) hours. 60 tablet 11  . feeding supplement, ENSURE ENLIVE, (ENSURE ENLIVE) LIQD Take 237 mLs by mouth 2 (two) times daily between meals. 237 mL 12  . furosemide (LASIX) 40 MG tablet Take 2 tablets (80 mg total) by mouth daily as needed. (Patient taking  differently: Take 80 mg by mouth daily as needed for fluid. ) 180 tablet 1  . gabapentin (NEURONTIN) 300 MG capsule Take 1 capsule (300 mg total) by mouth 3 (three) times daily. 90 capsule 6  . hydrochlorothiazide (HYDRODIURIL) 25 MG tablet Take 1 tablet (25 mg total) by mouth daily. 30 tablet 6  . Oxycodone HCl 10 MG TABS Take 1 tablet (10 mg total) by mouth every 8 (eight) hours as needed. 90 tablet 0  . pantoprazole (PROTONIX) 40 MG tablet Take 1 tablet (40 mg total) by mouth daily. 30 tablet 6  . predniSONE (DELTASONE) 5 MG tablet TAKE 1 TABLET BY MOUTH EVERY DAY WITH BREAKFAST. 30 tablet 2  . Probiotic Product (PROBIOTIC PO) Take 1 capsule by mouth daily.    . prochlorperazine (COMPAZINE) 10 MG tablet Take 1 tablet (10 mg total) by mouth every 6 (six) hours as needed for nausea or vomiting. 30 tablet 3  . traMADol (ULTRAM) 50 MG tablet Take 1 tablet (50 mg total) by mouth every 6 (six) hours as needed. 60 tablet 0  . zolpidem (AMBIEN) 10 MG tablet Take 1 tablet (10 mg total) by mouth at bedtime as needed for sleep. 30 tablet 5   No current facility-administered medications for this visit.      Allergies:  Allergies  Allergen Reactions  . Amitriptyline Hcl Nausea Only    REACTION: nausea, elevated bp  . Ezetimibe Other (See Comments)    REACTION: chest pain, fatigue    Past Medical History, Surgical history, Social history, and Family History reviewed and remain unchanged.   Physical Exam: Blood pressure (!) 165/81, pulse 86, temperature 98.4 F (36.9 C), temperature source Oral, resp. rate (!) 24, height 5\' 10"  (1.778 m), weight 274 lb 8 oz (124.5 kg), SpO2 92 %.   ECOG: 1 General appearance: Comfortable appearing gentleman without distress. Head: Atraumatic without abnormalities. Oropharynx: No oral thrush or ulcers. Eyes: Pupils are equal and round reactive to light. Lymph nodes: No lymphadenopathy noted in the cervical, supraclavicular or axillary areas. Heart: Regular  rate and rhythm without murmurs or gallops. Lung: clear to auscultation with expiratory wheezes noted in bilaterally. Abdomin:   Soft, nontender without any rebound or guarding. Musculoskeletal: No joint deformity or effusion. Skin: No petechiae or rash. Neurological: Intact motor and sensory exam.  Deep tendon reflexes intact.   Lab Results: Lab Results  Component Value Date   WBC 13.2 (H) 09/29/2017   HGB 15.3 08/13/2017   HCT 46.7 09/29/2017   MCV 88.8 09/29/2017   PLT 298 09/29/2017     Chemistry      Component Value Date/Time   NA 143 08/13/2017 1505   NA 141 06/26/2017 1444   K 4.0 08/13/2017 1505   K 4.3 06/26/2017 1444   CL 100 08/13/2017 1505   CO2 33 (H) 08/13/2017 1505   CO2 30 (H) 06/26/2017 1444   BUN 20 08/13/2017  1505   BUN 18.0 06/26/2017 1444   CREATININE 0.98 08/13/2017 1505   CREATININE 1.0 06/26/2017 1444      Component Value Date/Time   CALCIUM 9.4 08/13/2017 1505   CALCIUM 9.4 06/26/2017 1444   ALKPHOS 115 08/13/2017 1505   ALKPHOS 103 06/26/2017 1444   AST 14 08/13/2017 1505   AST 20 06/26/2017 1444   ALT 10 08/13/2017 1505   ALT 21 06/26/2017 1444   BILITOT 0.3 08/13/2017 1505   BILITOT 0.47 06/26/2017 1444       Results for Mohr, Jadore P "BOB" (MRN [id number]) as of 09/29/2017 15:11  Ref. Range 05/07/2017 14:45 06/26/2017 14:44 08/13/2017 15:05  Prostate Specific Ag, Serum Latest Ref Range: 0.0 - 4.0 ng/mL 4.2 (H) 3.6 2.8      Impression and Plan:   67 year old man with:  1.  Hormone sensitive advanced prostate cancer documented in July 2018.  At that time his PSA was 138 and developed a lymphadenopathy.  He is currently on Zytiga and has tolerated therapy reasonably well.  His PSA continues to show excellent response currently at 2.8.  Options of therapy will continue to be reviewed today as well as his overall prognosis.  He understands that this treatment duration is indefinite and he does not have a curable malignancy.   His disease will continue to be palliated adequately and Zytiga duration is determined by his tolerance as well as response to therapy.  Given his excellent response to PSA, his excellent tolerance we will continue Zytiga for the time being.  2. Androgen deprivation therapy: Risks and benefits of continuing Lupron therapy was reviewed today and he is agreeable to continue.  He will receive Lupron with the next visit in April 2019.  3. Bone directed therapy: he remains on calcium and vitamin D.  Delton See can be considered in the future after obtaining dental clearance.  4. Pain: Manageable at this time with oxycodone which he takes 3 times a day.  He does use Ultram as needed for less severe pain.  5. Nausea:No nausea or vomiting reported.  Compazine has been successful in treating his nausea.  6.  Prognosis:  his performance status remains adequate and aggressive treatment is warrant despite his incurable malignancy.ed   7. Follow-up: Will be in 5 weeks.   25  minutes was spent with the patient face-to-face today.  More than 50% of time was dedicated to patient counseling, education and coordinating his future plan of care including alternative treatment options.   Zola Button, MD 3/19/20193:16 PM

## 2017-09-30 ENCOUNTER — Telehealth: Payer: Self-pay | Admitting: *Deleted

## 2017-09-30 LAB — PROSTATE-SPECIFIC AG, SERUM (LABCORP): PROSTATE SPECIFIC AG, SERUM: 3.5 ng/mL (ref 0.0–4.0)

## 2017-09-30 NOTE — Telephone Encounter (Signed)
As noted below by Dr. Alen Blew, I informed his daughter of his PSA level. She verbalized understanding. She will tell him when he wakes up.

## 2017-09-30 NOTE — Telephone Encounter (Signed)
-----   Message from Wyatt Portela, MD sent at 09/30/2017  8:45 AM EDT ----- Please let him know his PSA is slightly up. No change for now.

## 2017-10-02 ENCOUNTER — Ambulatory Visit: Payer: Self-pay | Admitting: Pharmacy Technician

## 2017-10-05 ENCOUNTER — Other Ambulatory Visit: Payer: Self-pay | Admitting: Pharmacy Technician

## 2017-10-05 NOTE — Patient Outreach (Signed)
[facility]) Care Management  10/05/2017  EDAN JUDAY 15-Feb-1951 [id number]   Unsuccessful call #1 to Mr. Camper in reference to [employer] patient assistance. Left HIPAA compliant message with gentleman whom answered the phone.  Will call patient back tomorrow, if call not returned.  Maud Deed [place], [place] Management [phone]

## 2017-10-05 NOTE — Patient Outreach (Signed)
[facility]) Care Management  10/05/2017  Scott Rollins 01/07/1951 [id number]   Incoming call from Scott Rollins, HIPAA identifiers verified. Patient states he will check with his wife this evening in reference to receiving patient assistance application for [employer] for his Eliquis. Patient had a question in who to contact for refills of his Proair inhaler, based on previous notes, referred patient to call [place] patient assistance to request his refill. I also gave patient phone number to social security to request a new LIS letter.  Maud Deed [place], [place] Management [phone]

## 2017-10-06 ENCOUNTER — Ambulatory Visit: Payer: Medicare Other | Admitting: Pharmacy Technician

## 2017-10-09 ENCOUNTER — Other Ambulatory Visit: Payer: Self-pay

## 2017-10-09 ENCOUNTER — Other Ambulatory Visit: Payer: Self-pay | Admitting: *Deleted

## 2017-10-09 ENCOUNTER — Ambulatory Visit: Payer: Self-pay | Admitting: Pharmacist

## 2017-10-09 DIAGNOSIS — C61 Malignant neoplasm of prostate: Secondary | ICD-10-CM

## 2017-10-09 MED ORDER — ABIRATERONE ACETATE 250 MG PO TABS: 1000.0000 mg | ORAL_TABLET | Freq: Every day | ORAL | 0 refills | Status: DC

## 2017-10-09 NOTE — Patient Outreach (Signed)
[facility]) Care Management  10/09/2017  Scott Rollins 21-Oct-1950 [id number]   1st unsuccessful telephone call to the patient for monthly assessment.  No answer.  HIPAA compliant message left with contact information.  Plan:  RN Health Coach will make an out reach attempt to the patient in one business day.  Lazaro Arms RN, BSN, [place] Direct Dial:  [phone]  Fax: [phone]

## 2017-10-10 ENCOUNTER — Other Ambulatory Visit: Payer: Self-pay | Admitting: Oncology

## 2017-10-12 ENCOUNTER — Ambulatory Visit: Payer: Medicare Other | Admitting: Family Medicine

## 2017-10-12 ENCOUNTER — Other Ambulatory Visit: Payer: Self-pay | Admitting: Pharmacist

## 2017-10-12 ENCOUNTER — Other Ambulatory Visit: Payer: Self-pay

## 2017-10-12 DIAGNOSIS — Z0289 Encounter for other administrative examinations: Secondary | ICD-10-CM

## 2017-10-12 NOTE — Patient Outreach (Signed)
[facility]) Care Management  [place]  10/12/2017   SOTA HETZ 08-08-1950 [id number]  Subjective: Telephone call placed to the patient for monthly assessment.  HIPAA verified.  The patient states that he is doing fair.  The patient states that he has been having pain and rate it as a 7/10.  The patient states that he has had two falls in the past month.  He is not sure why but feels that his feet feel heavy and he also passed out. Patient also reported that he fell over the weekend and feels like he broke his index finger.  RNHC advised the patient to make an appointment with his physician to inform them of his condition.  Also to write all of his health questions down to discuss.  The patient verbalized understanding. He states that he had an appointment but cancelled it because it was not enough time to talk about all of his problems.  He states that he is adherent with his medications.  He states that he has voluntarily stopped driving.     Encounter Medications:  Outpatient Encounter Medications as of 10/12/2017  Medication Sig Note  . abiraterone acetate (ZYTIGA) 250 MG tablet Take 4 tablets (1,000 mg total) by mouth daily. Take on an empty stomach 1 hour before or 2 hours after a meal   . acetaminophen (TYLENOL) 500 MG tablet Take 2 tablets (1,000 mg total) by mouth 3 (three) times daily.   Marland Kitchen albuterol (PROAIR HFA) 108 (90 Base) MCG/ACT inhaler Inhale 2 puffs into the lungs every 6 (six) hours as needed for wheezing or shortness of breath.   Marland Kitchen albuterol (PROVENTIL) (2.5 MG/3ML) 0.083% nebulizer solution Take 3 mLs (2.5 mg total) by nebulization every 4 (four) hours as needed for wheezing or shortness of breath.   . allopurinol (ZYLOPRIM) 300 MG tablet Take 1 tablet (300 mg total) by mouth daily.   Marland Kitchen apixaban (ELIQUIS) 5 MG TABS tablet Take 1 tablet (5 mg total) by mouth 2 (two) times daily.   . bisoprolol-hydrochlorothiazide (ZIAC) 5-6.25 MG tablet Take 1  tablet by mouth every morning.   . budesonide-formoterol (SYMBICORT) 160-4.5 MCG/ACT inhaler Inhale 2 puffs into the lungs 2 (two) times daily.   . citalopram (CELEXA) 40 MG tablet Take 0.5 tablets (20 mg total) by mouth at bedtime. 02/25/2017: States he is taking a whole tablet 40 mg at bedtime.  . cyclobenzaprine (FLEXERIL) 10 MG tablet Take 1 tablet (10 mg total) by mouth at bedtime. 02/25/2017: States he is taking 10mg  daily at night.    . dextromethorphan-guaiFENesin (MUCINEX DM) 30-600 MG 12hr tablet Take 1 tablet by mouth 2 (two) times daily as needed for cough.   . diltiazem (CARDIZEM) 30 MG tablet Take 1 tablet (30 mg total) by mouth every 12 (twelve) hours.   . feeding supplement, ENSURE ENLIVE, (ENSURE ENLIVE) LIQD Take 237 mLs by mouth 2 (two) times daily between meals.   . furosemide (LASIX) 40 MG tablet Take 2 tablets (80 mg total) by mouth daily as needed. (Patient taking differently: Take 80 mg by mouth daily as needed for fluid. )   . gabapentin (NEURONTIN) 300 MG capsule Take 1 capsule (300 mg total) by mouth 3 (three) times daily. 09/24/2017: Patient only taking twice a day due to feeling drowsy  . hydrochlorothiazide (HYDRODIURIL) 25 MG tablet Take 1 tablet (25 mg total) by mouth daily.   . Oxycodone HCl 10 MG TABS Take 1 tablet (10 mg total) by mouth  every 8 (eight) hours as needed.   . pantoprazole (PROTONIX) 40 MG tablet Take 1 tablet (40 mg total) by mouth daily.   . predniSONE (DELTASONE) 5 MG tablet TAKE 1 TABLET BY MOUTH EVERY DAY WITH BREAKFAST   . Probiotic Product (PROBIOTIC PO) Take 1 capsule by mouth daily.   . prochlorperazine (COMPAZINE) 10 MG tablet Take 1 tablet (10 mg total) by mouth every 6 (six) hours as needed for nausea or vomiting.   . traMADol (ULTRAM) 50 MG tablet Take 1 tablet (50 mg total) by mouth every 6 (six) hours as needed.   . zolpidem (AMBIEN) 10 MG tablet Take 1 tablet (10 mg total) by mouth at bedtime as needed for sleep.    No  facility-administered encounter medications on file as of 10/12/2017.     Functional Status:  In your present state of health, do you have any difficulty performing the following activities: 05/05/2017 04/16/2017  Hearing? Y N  Comment some hearing loss in right ear -  Vision? Y N  Comment waers glasses -  Difficulty concentrating or making decisions? N N  Walking or climbing stairs? Y Y  Comment - pain, unstable, and breathing  Dressing or bathing? N N  Doing errands, shopping? Y N  Comment he states he ha slimitattion -  Conservation officer, nature and eating ? N N  Using the Toilet? N N  In the past six months, have you accidently leaked urine? N N  Comment - -  Do you have problems with loss of bowel control? N N  Managing your Medications? N N  Managing your Finances? N N  Housekeeping or managing your Housekeeping? N N  Some recent data might be hidden    Fall/Depression Screening: Fall Risk  10/12/2017 09/10/2017 08/10/2017  Falls in the past year? Yes No No  Comment - - -  Number falls in past yr: 2 or more - -  Injury with Fall? No - -  Risk Factor Category  High Fall Risk - -  Risk for fall due to : Impaired balance/gait - -  Risk for fall due to: Comment - - -  Follow up Falls evaluation completed;Education provided - -   PHQ 2/9 Scores 05/05/2017 04/16/2017 02/12/2017 11/13/2015  PHQ - 2 Score 2 2 0 3  PHQ- 9 Score 7 5 - 6    Assessment:   Plan:

## 2017-10-12 NOTE — Patient Outreach (Addendum)
[facility]) Care Management  10/12/2017  Scott Rollins 1951-06-05 [id number]   Called patient to follow up on medication assistance forms. HIPAA identifiers were obtained. Patient confirmed he received the forms and that he ordered refills of ProAir from [place] [facility].   However, the patient reported he could not find his [employer]. Patient was advised to go to [email or web address] and log into his account and print another letter.  Patient said he was going to do that as soon as we got off the phone.    Patient also reported that he fell over the weekend and feels like he broke his index finger and scratched his shin.  He said he did not hit his head. When asked about calling his PCP for an appointment, the patient said he had one for today but cancelled it because it was only a 15 minute appointment and he had a lot of things he wanted to discuss with his PCP like (med refills and getting a walker).  Patient was encouraged to reschedule an appointment with his PCP ASAP.  Plan: Patient will be followed by Scott Rollins, CPhT.  He is also being followed by telephonic Health Coach, Scott Rollins.   [name], PharmD, [place] Clinical Pharmacist [phone]

## 2017-10-20 ENCOUNTER — Other Ambulatory Visit: Payer: Self-pay | Admitting: *Deleted

## 2017-10-20 DIAGNOSIS — C61 Malignant neoplasm of prostate: Secondary | ICD-10-CM

## 2017-10-20 MED ORDER — ABIRATERONE ACETATE 250 MG PO TABS: 1000.0000 mg | ORAL_TABLET | Freq: Every day | ORAL | 0 refills | Status: DC

## 2017-10-21 ENCOUNTER — Other Ambulatory Visit: Payer: Self-pay | Admitting: Pharmacy Technician

## 2017-10-21 NOTE — Patient Outreach (Signed)
[facility]) Care Management  10/21/2017  Scott Rollins [birth year] [id number]   Successful outreach attempt to Scott Rollins, HIPAA identifiers verified. Scott Rollins states that he has received the [facility] patient assistance application. He  Is currently waiting on Social security to mail him his benefits letter and once received he will mail it in with the application.  Will follow up with patient in the next week or 2 if I have not heard/received anything from him  Maud Deed. [employer], [place] [facility] [phone]

## 2017-10-23 ENCOUNTER — Encounter: Payer: Self-pay | Admitting: Family Medicine

## 2017-11-04 ENCOUNTER — Inpatient Hospital Stay: Payer: Medicare Other

## 2017-11-04 ENCOUNTER — Inpatient Hospital Stay: Payer: Medicare Other | Admitting: Oncology

## 2017-11-04 ENCOUNTER — Other Ambulatory Visit: Payer: Self-pay | Admitting: Family Medicine

## 2017-11-04 MED ORDER — ALBUTEROL SULFATE (2.5 MG/3ML) 0.083% IN NEBU: 2.5000 mg | INHALATION_SOLUTION | RESPIRATORY_TRACT | 3 refills | Status: DC | PRN

## 2017-11-04 MED ORDER — ALBUTEROL SULFATE HFA 108 (90 BASE) MCG/ACT IN AERS: 2.0000 | INHALATION_SPRAY | Freq: Four times a day (QID) | RESPIRATORY_TRACT | 3 refills | Status: AC | PRN

## 2017-11-04 NOTE — Telephone Encounter (Signed)
Rx sent.  Pt advised and voiced understanding.   

## 2017-11-04 NOTE — Telephone Encounter (Signed)
Patient's wife Scott Rollins stopped by the office to check status of refill request.  I advised her the request was just received this morning, a couple of hours ago and that we typically ask for 3 business days for refill requests.  She states patient desperately needs medication and she would like to pick it up from pharmacy before she heads to work at 1:00pm today.

## 2017-11-04 NOTE — Addendum Note (Signed)
Addended by: Onalee Hua on: 11/04/2017 01:06 PM   Modules accepted: Orders

## 2017-11-04 NOTE — Telephone Encounter (Signed)
First request was for ProAir, I sent a refill in for that. I just received a fax stating pt needs albuterol solution for nebulizor. Rx sent. Pts wife also walked into the office at this time requesting this Rx. I advised her that I just sent it and we are sorry for the mix up. She voiced understanding and left without complaint.

## 2017-11-04 NOTE — Telephone Encounter (Signed)
Copied from [place]. Topic: Quick Communication - Rx Refill/Question >> Nov 04, 2017 10:48 AM Cleaster Corin, NT wrote: Medication: albuterol [facility] HFA) 108 (90 Base) MCG/ACT inhaler [[id number]]  Has the patient contacted their pharmacy?no (Agent: If no, request that the patient contact the pharmacy for the refill.) Preferred Pharmacy (with phone number or street name):[id number] - [place], [address] [place] [address] Phone: [phone] Fax: [phone]   Agent: Please be advised that RX refills may take up to 3 business days. We ask that you follow-up with your pharmacy.

## 2017-11-05 ENCOUNTER — Other Ambulatory Visit: Payer: Self-pay | Admitting: *Deleted

## 2017-11-05 ENCOUNTER — Inpatient Hospital Stay: Payer: Medicare Other | Attending: Oncology

## 2017-11-05 ENCOUNTER — Telehealth: Payer: Self-pay | Admitting: Oncology

## 2017-11-05 ENCOUNTER — Inpatient Hospital Stay (HOSPITAL_BASED_OUTPATIENT_CLINIC_OR_DEPARTMENT_OTHER): Payer: Medicare Other | Admitting: Oncology

## 2017-11-05 ENCOUNTER — Inpatient Hospital Stay: Payer: Medicare Other

## 2017-11-05 VITALS — BP 134/78 | HR 136 | Temp 98.5°F | Resp 18 | Ht 70.0 in | Wt 272.2 lb

## 2017-11-05 DIAGNOSIS — E291 Testicular hypofunction: Secondary | ICD-10-CM

## 2017-11-05 DIAGNOSIS — Z5111 Encounter for antineoplastic chemotherapy: Secondary | ICD-10-CM | POA: Diagnosis not present

## 2017-11-05 DIAGNOSIS — R52 Pain, unspecified: Secondary | ICD-10-CM

## 2017-11-05 DIAGNOSIS — C61 Malignant neoplasm of prostate: Secondary | ICD-10-CM

## 2017-11-05 DIAGNOSIS — C7951 Secondary malignant neoplasm of bone: Principal | ICD-10-CM

## 2017-11-05 DIAGNOSIS — R609 Edema, unspecified: Secondary | ICD-10-CM | POA: Diagnosis not present

## 2017-11-05 DIAGNOSIS — R251 Tremor, unspecified: Secondary | ICD-10-CM

## 2017-11-05 DIAGNOSIS — E876 Hypokalemia: Secondary | ICD-10-CM

## 2017-11-05 LAB — CBC WITH DIFFERENTIAL (CANCER CENTER ONLY)
Basophils Absolute: 0 10*3/uL (ref 0.0–0.1)
Basophils Relative: 0 %
Eosinophils Absolute: 0.2 10*3/uL (ref 0.0–0.5)
Eosinophils Relative: 2 %
HEMATOCRIT: 44.9 % (ref 38.4–49.9)
HEMOGLOBIN: 14.4 g/dL (ref 13.0–17.1)
LYMPHS ABS: 1 10*3/uL (ref 0.9–3.3)
Lymphocytes Relative: 10 %
MCH: 29.4 pg (ref 27.2–33.4)
MCHC: 32.1 g/dL (ref 32.0–36.0)
MCV: 91.6 fL (ref 79.3–98.0)
MONOS PCT: 7 %
Monocytes Absolute: 0.6 10*3/uL (ref 0.1–0.9)
NEUTROS PCT: 81 %
Neutro Abs: 7.8 10*3/uL — ABNORMAL HIGH (ref 1.5–6.5)
Platelet Count: 251 10*3/uL (ref 140–400)
RBC: 4.9 MIL/uL (ref 4.20–5.82)
RDW: 13.7 % (ref 11.0–14.6)
WBC Count: 9.6 10*3/uL (ref 4.0–10.3)

## 2017-11-05 LAB — CMP (CANCER CENTER ONLY)
ALK PHOS: 75 U/L (ref 40–150)
ALT: 14 U/L (ref 0–55)
ANION GAP: 11 (ref 3–11)
AST: 19 U/L (ref 5–34)
Albumin: 3.2 g/dL — ABNORMAL LOW (ref 3.5–5.0)
BUN: 19 mg/dL (ref 7–26)
CALCIUM: 9.5 mg/dL (ref 8.4–10.4)
CO2: 39 mmol/L — ABNORMAL HIGH (ref 22–29)
Chloride: 93 mmol/L — ABNORMAL LOW (ref 98–109)
Creatinine: 1.12 mg/dL (ref 0.70–1.30)
GFR, Estimated: >60 mL/min (ref >60)
Glucose, Bld: 120 mg/dL (ref 70–140)
Potassium: 2.7 mmol/L — CL (ref 3.5–5.1)
Sodium: 143 mmol/L (ref 136–145)
TOTAL PROTEIN: 6.3 g/dL — AB (ref 6.4–8.3)
Total Bilirubin: 0.6 mg/dL (ref 0.2–1.2)

## 2017-11-05 MED ORDER — OXYCODONE HCL 10 MG PO TABS: 10.0000 mg | ORAL_TABLET | Freq: Three times a day (TID) | ORAL | 0 refills | Status: DC | PRN

## 2017-11-05 MED ORDER — TRAMADOL HCL 50 MG PO TABS: 50.0000 mg | ORAL_TABLET | Freq: Four times a day (QID) | ORAL | 1 refills | Status: DC | PRN

## 2017-11-05 MED ORDER — DENOSUMAB 120 MG/1.7ML ~~LOC~~ SOLN
SUBCUTANEOUS | Status: AC
  Filled 2017-11-05: qty 1.7

## 2017-11-05 MED ORDER — POTASSIUM CHLORIDE CRYS ER 20 MEQ PO TBCR: 20.0000 meq | EXTENDED_RELEASE_TABLET | Freq: Every day | ORAL | 0 refills | Status: DC

## 2017-11-05 MED ORDER — DENOSUMAB 120 MG/1.7ML ~~LOC~~ SOLN: 120.0000 mg | Freq: Once | SUBCUTANEOUS | Status: DC

## 2017-11-05 MED ORDER — LEUPROLIDE ACETATE (4 MONTH) 30 MG IM KIT
30.0000 mg | PACK | Freq: Once | INTRAMUSCULAR | Status: AC
  Administered 2017-11-05: 30 mg via INTRAMUSCULAR
  Filled 2017-11-05: qty 30

## 2017-11-05 NOTE — Telephone Encounter (Signed)
Per Dr. Alen Blew, I E-scribed a prescription for K-dur to his [facility] [place]. Potassium level was 2.7. Left a message for patient to start taking either tonight or tomorrow for 14 days. Instructed him to call [facility] if he had any questions or concerns.

## 2017-11-05 NOTE — Telephone Encounter (Signed)
Scheduled appt per 4/25 los - gave pt avs and calender - central radiology to contact with ct scan.

## 2017-11-05 NOTE — Progress Notes (Addendum)
Hematology and Oncology Follow Up Visit  Scott Rollins [id number] 16-Mar-1951 67 y.o. 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 He was diagnosed in 2012 with Gleason score 4+4 = 8 and PSA of 4.91.    Prior Therapy:  He is status post external beam radiation after 4 months of androgen deprivation completed in 2012 as a definitive treatment. He developed advanced disease in July in 2018.   Current therapy:  Lupron started in July 2018 initially at 7.5 mg monthly and will be switched to 30 mg every 4 months starting August 2018.  Zytiga and 1000 mg daily and 5 mg of prednisone. Started in August 2018.  Interim History:  Scott Rollins is here for a follow-up visit.  He reports no major changes in his health since his last visit.  He did have an episode of COPD exacerbation the last 24 hours described by increased cough and mucus production.  His symptoms have improved today.  He does not report any dyspnea on exertion at this time.  He is experiencing symptoms of fatigue and tremors and did report syncopal episode.  He denied any complications related to Zytiga including nausea, vomiting or excessive fatigue or tiredness.  His bone pain remained controlled with the current pain regimen.  He does not report any headaches or blurry vision, altered mental status. He did have a syncopal episode. He does not report any fevers or chills or sweats. He does not report any  wheezing or hemoptysis. He does not report any chest pain, palpitation with mild leg edema. He does not report any nausea, vomiting or abdominal pain.  He denies any hematochezia or melena.  He does not report any frequency urgency or hesitancy.  He denies any pathological fractures or joint pain.  He denies any skin rashes or lesions.  He denies any lymphadenopathy, petechiae or easy  bruising.   He denies any anxiety or depression.  Remaining review of systems is negative.   Medications: I have reviewed the patient's current medications.  Current Outpatient Medications  Medication Sig Dispense Refill  . abiraterone acetate (ZYTIGA) 250 MG tablet Take 4 tablets (1,000 mg total) by mouth daily. Take on an empty stomach 1 hour before or 2 hours after a meal 120 tablet 0  . acetaminophen (TYLENOL) 500 MG tablet Take 2 tablets (1,000 mg total) by mouth 3 (three) times daily. 30 tablet 0  . albuterol (PROAIR HFA) 108 (90 Base) MCG/ACT inhaler Inhale 2 puffs into the lungs every 6 (six) hours as needed for wheezing or shortness of breath. 18 g 3  . albuterol (PROVENTIL) (2.5 MG/3ML) 0.083% nebulizer solution Take 3 mLs (2.5 mg total) by nebulization every 4 (four) hours as needed for wheezing or shortness of breath. 75 mL 3  . allopurinol (ZYLOPRIM) 300 MG tablet Take 1 tablet (300 mg total) by mouth daily. 90 tablet 3  . apixaban (ELIQUIS) 5 MG TABS tablet Take 1 tablet (5 mg total) by mouth 2 (two) times daily. 180 tablet 1  . bisoprolol-hydrochlorothiazide (ZIAC) 5-6.25 MG tablet Take 1 tablet by mouth every morning. 90 tablet 3  . budesonide-formoterol (SYMBICORT) 160-4.5 MCG/ACT inhaler Inhale 2 puffs into the lungs 2 (two) times daily. 3 Inhaler 3  . citalopram (CELEXA) 40 MG tablet Take 0.5 tablets (20 mg total) by mouth at bedtime. 90 tablet 3  . cyclobenzaprine (FLEXERIL) 10 MG tablet Take  1 tablet (10 mg total) by mouth at bedtime. 90 tablet 1  . dextromethorphan-guaiFENesin (MUCINEX DM) 30-600 MG 12hr tablet Take 1 tablet by mouth 2 (two) times daily as needed for cough.    . diltiazem (CARDIZEM) 30 MG tablet Take 1 tablet (30 mg total) by mouth every 12 (twelve) hours. 60 tablet 11  . feeding supplement, ENSURE ENLIVE, (ENSURE ENLIVE) LIQD Take 237 mLs by mouth 2 (two) times daily between meals. 237 mL 12  . furosemide (LASIX) 40 MG tablet Take 2 tablets (80 mg total) by  mouth daily as needed. (Patient taking differently: Take 80 mg by mouth daily as needed for fluid. ) 180 tablet 1  . gabapentin (NEURONTIN) 300 MG capsule Take 1 capsule (300 mg total) by mouth 3 (three) times daily. 90 capsule 6  . hydrochlorothiazide (HYDRODIURIL) 25 MG tablet Take 1 tablet (25 mg total) by mouth daily. 30 tablet 6  . Oxycodone HCl 10 MG TABS Take 1 tablet (10 mg total) by mouth every 8 (eight) hours as needed. 90 tablet 0  . pantoprazole (PROTONIX) 40 MG tablet Take 1 tablet (40 mg total) by mouth daily. 30 tablet 6  . predniSONE (DELTASONE) 5 MG tablet TAKE 1 TABLET BY MOUTH EVERY DAY WITH BREAKFAST 30 tablet 2  . Probiotic Product (PROBIOTIC PO) Take 1 capsule by mouth daily.    . prochlorperazine (COMPAZINE) 10 MG tablet Take 1 tablet (10 mg total) by mouth every 6 (six) hours as needed for nausea or vomiting. 30 tablet 3  . traMADol (ULTRAM) 50 MG tablet Take 1 tablet (50 mg total) by mouth every 6 (six) hours as needed. 90 tablet 1  . zolpidem (AMBIEN) 10 MG tablet Take 1 tablet (10 mg total) by mouth at bedtime as needed for sleep. 30 tablet 5   No current facility-administered medications for this visit.      Allergies:  Allergies  Allergen Reactions  . Amitriptyline Hcl Nausea Only    REACTION: nausea, elevated bp  . Ezetimibe Other (See Comments)    REACTION: chest pain, fatigue    Past Medical History, Surgical history, Social history, and Family History reviewed and remain unchanged.   Physical Exam: Blood pressure 134/78, pulse (!) 136, temperature 98.5 F (36.9 C), temperature source Oral, resp. rate 18, height 5\' 10"  (1.778 m), weight 272 lb 3.2 oz (123.5 kg), SpO2 92 %.   ECOG: 1 General appearance: Alert, oriented without distress.   Head: without masses or trauma.  Oropharynx: No lesions or masses Eyes: sclera is clear.  Extraocular muscle intact. Lymph nodes:Cervical, supraclavicular or axillary nodes are normal.  Heart: Regular rate without  any murmurs or gallops. Lung: clear in all lung fields without any wheezes, rhonchi or dullness to percussion.  I  Abdomin:   Soft, with good bowl sounds. No rebound or guarding.  Musculoskeletal: No clubbing or joint deformity.  Full range of motion noted in all joints. Skin: No ecchymosis or rash. Neurological: no deficits noted.  No motor or sensory deficits.  Ambulating without any difficulties.   Lab Results: Lab Results  Component Value Date   WBC 9.6 11/05/2017   HGB 14.4 11/05/2017   HCT 44.9 11/05/2017   MCV 91.6 11/05/2017   PLT 251 11/05/2017     Chemistry      Component Value Date/Time   NA 141 09/29/2017 1454   NA 141 06/26/2017 1444   K 3.1 (L) 09/29/2017 1454   K 4.3 06/26/2017 1444   CL 91 (  L) 09/29/2017 1454   CO2 39 (H) 09/29/2017 1454   CO2 30 (H) 06/26/2017 1444   BUN 18 09/29/2017 1454   BUN 18.0 06/26/2017 1444   CREATININE 0.92 09/29/2017 1454   CREATININE 1.0 06/26/2017 1444      Component Value Date/Time   CALCIUM 9.7 09/29/2017 1454   CALCIUM 9.4 06/26/2017 1444   ALKPHOS 86 09/29/2017 1454   ALKPHOS 103 06/26/2017 1444   AST 17 09/29/2017 1454   AST 20 06/26/2017 1444   ALT 16 09/29/2017 1454   ALT 21 06/26/2017 1444   BILITOT 0.6 09/29/2017 1454   BILITOT 0.47 06/26/2017 1444       Results for Bouknight, Almir P "BOB" (MRN [id number]) as of 11/05/2017 12:29  Ref. Range 06/26/2017 14:44 08/13/2017 15:05 09/29/2017 14:54  Prostate Specific Ag, Serum Latest Ref Range: 0.0 - 4.0 ng/mL 3.6 2.8 3.5      Impression and Plan:   67 year old man with:  1. Castration- sensitive prostate cancer with disease to the bone and lymphadenopathy.  He was initially diagnosed in 2012 but developed advanced disease in July 2018.   He is currently on Zytiga and prednisone with excellent response to therapy overall.  He is experiencing his symptoms related to this therapy including tremors and edema.  His PSA did rise slightly to 3.5 last month.  Given  the symptoms that he is experiencing I have opted to eliminate prednisone from his regimen.  I have asked him to taper his prednisone down to 5 mg every other day for a week then twice a week for another week and then stop.  The plan is to repeat imaging studies with a CT scan and bone scan before the next visit.  We will keep the Zytiga dose as is for the time being.  2. Androgen deprivation therapy: He will receive Lupron at 30 mg today on 11/05/2017 and repeated in 4 months.  3. Bone directed therapy: Delton See has been on hold for the time being awaiting dental clearance.  He is scheduled to have dental surgery in the future.  4. Pain: He takes oxycodone and Ultram 3 times a day both medication refilled today.  His pain is under control.  5.  Tremors and edema: Decreasing prednisone and eventually discontinuation should help with the symptoms.  6.  Prognosis: Treatment remains palliative at this time although his performance status is adequate and aggressive treatment is warranted.  7. Hypokalemia: Related to Zytiga and Lasix.  Will replace potassium appropriately.  8. Follow-up: Will be in 5 weeks.   25  minutes was spent with the patient face-to-face today.  More than 50% of time was dedicated to patient counseling, education and answering questions regarding future plan of care.  Zola Button, MD 4/25/201912:31 PM

## 2017-11-06 ENCOUNTER — Telehealth: Payer: Self-pay | Admitting: *Deleted

## 2017-11-06 LAB — PROSTATE-SPECIFIC AG, SERUM (LABCORP): Prostate Specific Ag, Serum: 2.2 ng/mL (ref 0.0–4.0)

## 2017-11-06 NOTE — Telephone Encounter (Signed)
Lm on answering machine re: lab results

## 2017-11-06 NOTE — Telephone Encounter (Signed)
-----   Message from Wyatt Portela, MD sent at 11/06/2017  8:39 AM EDT ----- Please let him know his PSA is lower.

## 2017-11-12 ENCOUNTER — Other Ambulatory Visit: Payer: Self-pay | Admitting: Pharmacy Technician

## 2017-11-12 NOTE — Patient Outreach (Signed)
[facility]) Care Management  11/12/2017  Scott Rollins [birth year] [id number]   Unsuccessful outreach attempt #1in regards to [name] patient assistance application, Left HIPAA compliant voicemail to return my call.  Will follow up with patient on 05/06 if call not returned by then.  Maud Deed Nelson, [place] [facility] [phone]

## 2017-11-13 ENCOUNTER — Other Ambulatory Visit: Payer: Self-pay | Admitting: *Deleted

## 2017-11-13 ENCOUNTER — Telehealth: Payer: Self-pay | Admitting: Family Medicine

## 2017-11-13 DIAGNOSIS — C61 Malignant neoplasm of prostate: Secondary | ICD-10-CM

## 2017-11-13 DIAGNOSIS — C7951 Secondary malignant neoplasm of bone: Secondary | ICD-10-CM

## 2017-11-13 MED ORDER — PROCHLORPERAZINE MALEATE 10 MG PO TABS: 10.0000 mg | ORAL_TABLET | Freq: Four times a day (QID) | ORAL | 2 refills | Status: AC | PRN

## 2017-11-13 MED ORDER — TAMSULOSIN HCL 0.4 MG PO CAPS: 0.4000 mg | ORAL_CAPSULE | Freq: Every day | ORAL | 3 refills | Status: AC

## 2017-11-13 MED ORDER — ABIRATERONE ACETATE 250 MG PO TABS: 1000.0000 mg | ORAL_TABLET | Freq: Every day | ORAL | 0 refills | Status: DC

## 2017-11-13 NOTE — Telephone Encounter (Signed)
Pt has apt on 11/17/17 at 11:00am.  Please advise. Thanks.

## 2017-11-13 NOTE — Telephone Encounter (Signed)
Copied from [place]. Topic: Quick Communication - See Telephone Encounter >> Nov 13, 2017 12:09 PM Synthia Innocent wrote: CRM for notification. See Telephone encounter for: 11/13/17. Requesting something to be called in for incontinence, has taken it before, aware of drug name. Please advise. [place]

## 2017-11-13 NOTE — Telephone Encounter (Signed)
Left message for pt to call back.   Okay to transfer call to [facility].  

## 2017-11-13 NOTE — Telephone Encounter (Signed)
OK. I sent flomax to Scott Rollins. Remind pt that this med can make him slightly dizzy with the first dose----then this doesn't happen again on any subsequent doses.-thx

## 2017-11-16 ENCOUNTER — Inpatient Hospital Stay (HOSPITAL_COMMUNITY): Payer: Medicare Other

## 2017-11-16 ENCOUNTER — Other Ambulatory Visit: Payer: Self-pay

## 2017-11-16 ENCOUNTER — Inpatient Hospital Stay (HOSPITAL_COMMUNITY)
Admission: EM | Admit: 2017-11-16 | Discharge: 2017-11-19 | DRG: 189 | Disposition: A | Discharge status: Home / Self Care | Payer: Medicare Other | Attending: Internal Medicine | Admitting: Internal Medicine

## 2017-11-16 ENCOUNTER — Emergency Department (HOSPITAL_COMMUNITY): Payer: Medicare Other

## 2017-11-16 ENCOUNTER — Encounter (HOSPITAL_COMMUNITY): Payer: Self-pay | Admitting: Emergency Medicine

## 2017-11-16 DIAGNOSIS — F419 Anxiety disorder, unspecified: Secondary | ICD-10-CM | POA: Diagnosis present

## 2017-11-16 DIAGNOSIS — J9602 Acute respiratory failure with hypercapnia: Secondary | ICD-10-CM

## 2017-11-16 DIAGNOSIS — Z79899 Other long term (current) drug therapy: Secondary | ICD-10-CM

## 2017-11-16 DIAGNOSIS — I2583 Coronary atherosclerosis due to lipid rich plaque: Secondary | ICD-10-CM | POA: Diagnosis present

## 2017-11-16 DIAGNOSIS — E876 Hypokalemia: Secondary | ICD-10-CM | POA: Diagnosis present

## 2017-11-16 DIAGNOSIS — Z9981 Dependence on supplemental oxygen: Secondary | ICD-10-CM

## 2017-11-16 DIAGNOSIS — J449 Chronic obstructive pulmonary disease, unspecified: Secondary | ICD-10-CM

## 2017-11-16 DIAGNOSIS — I4891 Unspecified atrial fibrillation: Secondary | ICD-10-CM | POA: Diagnosis present

## 2017-11-16 DIAGNOSIS — C61 Malignant neoplasm of prostate: Secondary | ICD-10-CM | POA: Diagnosis present

## 2017-11-16 DIAGNOSIS — I480 Paroxysmal atrial fibrillation: Secondary | ICD-10-CM | POA: Diagnosis present

## 2017-11-16 DIAGNOSIS — J441 Chronic obstructive pulmonary disease with (acute) exacerbation: Secondary | ICD-10-CM | POA: Diagnosis present

## 2017-11-16 DIAGNOSIS — J810 Acute pulmonary edema: Secondary | ICD-10-CM | POA: Diagnosis not present

## 2017-11-16 DIAGNOSIS — Z818 Family history of other mental and behavioral disorders: Secondary | ICD-10-CM

## 2017-11-16 DIAGNOSIS — J9620 Acute and chronic respiratory failure, unspecified whether with hypoxia or hypercapnia: Secondary | ICD-10-CM | POA: Diagnosis present

## 2017-11-16 DIAGNOSIS — J9621 Acute and chronic respiratory failure with hypoxia: Secondary | ICD-10-CM | POA: Diagnosis present

## 2017-11-16 DIAGNOSIS — Z923 Personal history of irradiation: Secondary | ICD-10-CM

## 2017-11-16 DIAGNOSIS — I2510 Atherosclerotic heart disease of native coronary artery without angina pectoris: Secondary | ICD-10-CM | POA: Diagnosis present

## 2017-11-16 DIAGNOSIS — I10 Essential (primary) hypertension: Secondary | ICD-10-CM | POA: Diagnosis not present

## 2017-11-16 DIAGNOSIS — E669 Obesity, unspecified: Secondary | ICD-10-CM | POA: Diagnosis present

## 2017-11-16 DIAGNOSIS — C7951 Secondary malignant neoplasm of bone: Secondary | ICD-10-CM | POA: Diagnosis present

## 2017-11-16 DIAGNOSIS — E662 Morbid (severe) obesity with alveolar hypoventilation: Secondary | ICD-10-CM | POA: Diagnosis present

## 2017-11-16 DIAGNOSIS — Z86010 Personal history of colonic polyps: Secondary | ICD-10-CM

## 2017-11-16 DIAGNOSIS — I252 Old myocardial infarction: Secondary | ICD-10-CM

## 2017-11-16 DIAGNOSIS — I5033 Acute on chronic diastolic (congestive) heart failure: Secondary | ICD-10-CM | POA: Diagnosis not present

## 2017-11-16 DIAGNOSIS — C779 Secondary and unspecified malignant neoplasm of lymph node, unspecified: Secondary | ICD-10-CM | POA: Diagnosis present

## 2017-11-16 DIAGNOSIS — G894 Chronic pain syndrome: Secondary | ICD-10-CM | POA: Diagnosis present

## 2017-11-16 DIAGNOSIS — R0600 Dyspnea, unspecified: Secondary | ICD-10-CM | POA: Diagnosis not present

## 2017-11-16 DIAGNOSIS — G40909 Epilepsy, unspecified, not intractable, without status epilepticus: Secondary | ICD-10-CM | POA: Diagnosis present

## 2017-11-16 DIAGNOSIS — M1A9XX0 Chronic gout, unspecified, without tophus (tophi): Secondary | ICD-10-CM | POA: Diagnosis present

## 2017-11-16 DIAGNOSIS — L03115 Cellulitis of right lower limb: Secondary | ICD-10-CM | POA: Diagnosis present

## 2017-11-16 DIAGNOSIS — K219 Gastro-esophageal reflux disease without esophagitis: Secondary | ICD-10-CM | POA: Diagnosis present

## 2017-11-16 DIAGNOSIS — Z955 Presence of coronary angioplasty implant and graft: Secondary | ICD-10-CM

## 2017-11-16 DIAGNOSIS — Z9119 Patient's noncompliance with other medical treatment and regimen: Secondary | ICD-10-CM

## 2017-11-16 DIAGNOSIS — Z7951 Long term (current) use of inhaled steroids: Secondary | ICD-10-CM

## 2017-11-16 DIAGNOSIS — J438 Other emphysema: Secondary | ICD-10-CM | POA: Diagnosis present

## 2017-11-16 DIAGNOSIS — Z87891 Personal history of nicotine dependence: Secondary | ICD-10-CM

## 2017-11-16 DIAGNOSIS — G43109 Migraine with aura, not intractable, without status migrainosus: Secondary | ICD-10-CM | POA: Diagnosis present

## 2017-11-16 DIAGNOSIS — J9622 Acute and chronic respiratory failure with hypercapnia: Principal | ICD-10-CM | POA: Diagnosis present

## 2017-11-16 DIAGNOSIS — Z79891 Long term (current) use of opiate analgesic: Secondary | ICD-10-CM

## 2017-11-16 DIAGNOSIS — R069 Unspecified abnormalities of breathing: Secondary | ICD-10-CM | POA: Diagnosis not present

## 2017-11-16 DIAGNOSIS — I110 Hypertensive heart disease with heart failure: Secondary | ICD-10-CM | POA: Diagnosis present

## 2017-11-16 DIAGNOSIS — R0602 Shortness of breath: Secondary | ICD-10-CM | POA: Diagnosis not present

## 2017-11-16 DIAGNOSIS — E785 Hyperlipidemia, unspecified: Secondary | ICD-10-CM | POA: Diagnosis present

## 2017-11-16 DIAGNOSIS — Z888 Allergy status to other drugs, medicaments and biological substances status: Secondary | ICD-10-CM

## 2017-11-16 DIAGNOSIS — Z7901 Long term (current) use of anticoagulants: Secondary | ICD-10-CM

## 2017-11-16 DIAGNOSIS — Z9221 Personal history of antineoplastic chemotherapy: Secondary | ICD-10-CM

## 2017-11-16 DIAGNOSIS — F329 Major depressive disorder, single episode, unspecified: Secondary | ICD-10-CM | POA: Diagnosis present

## 2017-11-16 DIAGNOSIS — M1A0120 Idiopathic chronic gout, left shoulder, without tophus (tophi): Secondary | ICD-10-CM

## 2017-11-16 DIAGNOSIS — Z8249 Family history of ischemic heart disease and other diseases of the circulatory system: Secondary | ICD-10-CM

## 2017-11-16 DIAGNOSIS — Z7952 Long term (current) use of systemic steroids: Secondary | ICD-10-CM

## 2017-11-16 DIAGNOSIS — M7989 Other specified soft tissue disorders: Secondary | ICD-10-CM | POA: Diagnosis not present

## 2017-11-16 LAB — BLOOD GAS, ARTERIAL
ACID-BASE EXCESS: 13.6 mmol/L — AB (ref 0.0–2.0)
ACID-BASE EXCESS: 13.8 mmol/L — AB (ref 0.0–2.0)
Acid-Base Excess: 14.7 mmol/L — ABNORMAL HIGH (ref 0.0–2.0)
Acid-Base Excess: 16 mmol/L — ABNORMAL HIGH (ref 0.0–2.0)
BICARBONATE: 44.2 mmol/L — AB (ref 20.0–28.0)
BICARBONATE: 44.5 mmol/L — AB (ref 20.0–28.0)
Bicarbonate: 43 mmol/L — ABNORMAL HIGH (ref 20.0–28.0)
Bicarbonate: 44.1 mmol/L — ABNORMAL HIGH (ref 20.0–28.0)
DRAWN BY: 441261
DRAWN BY: 441261
DRAWN BY: 441261
DRAWN BY: 422461
Delivery systems: POSITIVE
Delivery systems: POSITIVE
Delivery systems: POSITIVE
FIO2: 50
FIO2: 50
FIO2: 50
O2 Content: 3 L/min
O2 SAT: 96.3 %
O2 SAT: 95.8 %
O2 Saturation: 95.4 %
O2 Saturation: 94.9 %
PATIENT TEMPERATURE: 98.6
PATIENT TEMPERATURE: 98.6
PCO2 ART: 94.2 mmHg — AB (ref 32.0–48.0)
PCO2 ART: 87.3 mmHg — AB (ref 32.0–48.0)
PEEP: 5 cmH2O
PEEP: 5 cmH2O
PEEP: 5 cmH2O
PH ART: 7.356 (ref 7.350–7.450)
PO2 ART: 84.1 mmHg (ref 83.0–108.0)
PO2 ART: 89.6 mmHg (ref 83.0–108.0)
PRESSURE CONTROL: 15 cmH2O
Patient temperature: 98.6
Patient temperature: 97.6
Pressure control: 10 cmH2O
Pressure control: 15 cmH2O
RATE: 6 resp/min
pCO2 arterial: 78.8 mmHg (ref 32.0–48.0)
pCO2 arterial: 67.3 mmHg (ref 32.0–48.0)
pH, Arterial: 7.293 — ABNORMAL LOW (ref 7.350–7.450)
pH, Arterial: 7.328 — ABNORMAL LOW (ref 7.350–7.450)
pH, Arterial: 7.429 (ref 7.350–7.450)
pO2, Arterial: 80.4 mmHg — ABNORMAL LOW (ref 83.0–108.0)
pO2, Arterial: 70.7 mmHg — ABNORMAL LOW (ref 83.0–108.0)

## 2017-11-16 LAB — CBC WITH DIFFERENTIAL/PLATELET
Basophils Absolute: 0 10*3/uL (ref 0.0–0.1)
Basophils Relative: 0 %
Eosinophils Absolute: 0.1 10*3/uL (ref 0.0–0.7)
Eosinophils Relative: 1 %
HEMATOCRIT: 39.5 % (ref 39.0–52.0)
HEMOGLOBIN: 12.1 g/dL — AB (ref 13.0–17.0)
LYMPHS ABS: 1.1 10*3/uL (ref 0.7–4.0)
Lymphocytes Relative: 11 %
MCH: 29.4 pg (ref 26.0–34.0)
MCHC: 30.6 g/dL (ref 30.0–36.0)
MCV: 95.9 fL (ref 78.0–100.0)
MONO ABS: 0.3 10*3/uL (ref 0.1–1.0)
MONOS PCT: 4 %
NEUTROS ABS: 7.8 10*3/uL — AB (ref 1.7–7.7)
NEUTROS PCT: 84 %
Platelets: 200 10*3/uL (ref 150–400)
RBC: 4.12 MIL/uL — ABNORMAL LOW (ref 4.22–5.81)
RDW: 14.2 % (ref 11.5–15.5)
WBC: 9.4 10*3/uL (ref 4.0–10.5)

## 2017-11-16 LAB — I-STAT CHEM 8, ED
BUN: 9 mg/dL (ref 6–20)
CREATININE: 0.8 mg/dL (ref 0.61–1.24)
Calcium, Ion: 1.08 mmol/L — ABNORMAL LOW (ref 1.15–1.40)
Chloride: 89 mmol/L — ABNORMAL LOW (ref 101–111)
Glucose, Bld: 104 mg/dL — ABNORMAL HIGH (ref 65–99)
HCT: 44 % (ref 39.0–52.0)
Hemoglobin: 15 g/dL (ref 13.0–17.0)
Potassium: 3.1 mmol/L — ABNORMAL LOW (ref 3.5–5.1)
Sodium: 140 mmol/L (ref 135–145)
TCO2: 44 mmol/L — AB (ref 22–32)

## 2017-11-16 LAB — PROCALCITONIN: Procalcitonin: 0.12 ng/mL

## 2017-11-16 LAB — I-STAT TROPONIN, ED: TROPONIN I, POC: 0.29 ng/mL — AB (ref 0.00–0.08)

## 2017-11-16 LAB — BRAIN NATRIURETIC PEPTIDE: B Natriuretic Peptide: 275.9 pg/mL — ABNORMAL HIGH (ref 0.0–100.0)

## 2017-11-16 MED ORDER — CEFAZOLIN SODIUM-DEXTROSE 1-4 GM/50ML-% IV SOLN
1.0000 g | Freq: Three times a day (TID) | INTRAVENOUS | Status: DC
  Administered 2017-11-16 – 2017-11-18 (×6): 1 g via INTRAVENOUS
  Filled 2017-11-16 (×7): qty 50

## 2017-11-16 MED ORDER — FUROSEMIDE 10 MG/ML IJ SOLN
40.0000 mg | Freq: Two times a day (BID) | INTRAMUSCULAR | Status: DC
  Administered 2017-11-16 – 2017-11-19 (×6): 40 mg via INTRAVENOUS
  Filled 2017-11-16 (×6): qty 4

## 2017-11-16 MED ORDER — OXYCODONE HCL 5 MG PO TABS
5.0000 mg | ORAL_TABLET | Freq: Three times a day (TID) | ORAL | Status: DC | PRN
  Administered 2017-11-17 – 2017-11-19 (×7): 5 mg via ORAL
  Filled 2017-11-16 (×7): qty 1

## 2017-11-16 MED ORDER — BISOPROLOL FUMARATE 5 MG PO TABS
5.0000 mg | ORAL_TABLET | Freq: Every day | ORAL | Status: DC
  Administered 2017-11-16 – 2017-11-17 (×2): 5 mg via ORAL
  Filled 2017-11-16 (×2): qty 1

## 2017-11-16 MED ORDER — DILTIAZEM HCL 30 MG PO TABS
30.0000 mg | ORAL_TABLET | Freq: Two times a day (BID) | ORAL | Status: DC
  Administered 2017-11-16 – 2017-11-19 (×6): 30 mg via ORAL
  Filled 2017-11-16 (×6): qty 1

## 2017-11-16 MED ORDER — ALLOPURINOL 300 MG PO TABS
300.0000 mg | ORAL_TABLET | Freq: Every day | ORAL | Status: DC
  Administered 2017-11-16 – 2017-11-19 (×4): 300 mg via ORAL
  Filled 2017-11-16: qty 3
  Filled 2017-11-16 (×3): qty 1

## 2017-11-16 MED ORDER — MOMETASONE FURO-FORMOTEROL FUM 200-5 MCG/ACT IN AERO
2.0000 | INHALATION_SPRAY | Freq: Two times a day (BID) | RESPIRATORY_TRACT | Status: DC
  Filled 2017-11-16: qty 8.8

## 2017-11-16 MED ORDER — TRAMADOL HCL 50 MG PO TABS
50.0000 mg | ORAL_TABLET | Freq: Four times a day (QID) | ORAL | Status: DC | PRN
  Administered 2017-11-17 – 2017-11-19 (×7): 50 mg via ORAL
  Filled 2017-11-16 (×7): qty 1

## 2017-11-16 MED ORDER — POTASSIUM CHLORIDE CRYS ER 20 MEQ PO TBCR
40.0000 meq | EXTENDED_RELEASE_TABLET | Freq: Once | ORAL | Status: DC
  Filled 2017-11-16: qty 2

## 2017-11-16 MED ORDER — POTASSIUM CHLORIDE CRYS ER 20 MEQ PO TBCR
40.0000 meq | EXTENDED_RELEASE_TABLET | Freq: Every day | ORAL | Status: DC
  Administered 2017-11-16 – 2017-11-17 (×2): 40 meq via ORAL
  Filled 2017-11-16 (×2): qty 2

## 2017-11-16 MED ORDER — ACETAMINOPHEN 325 MG PO TABS
650.0000 mg | ORAL_TABLET | Freq: Four times a day (QID) | ORAL | Status: DC | PRN
  Administered 2017-11-17 – 2017-11-19 (×5): 650 mg via ORAL
  Filled 2017-11-16 (×5): qty 2

## 2017-11-16 MED ORDER — METHYLPREDNISOLONE SODIUM SUCC 125 MG IJ SOLR
80.0000 mg | Freq: Two times a day (BID) | INTRAMUSCULAR | Status: DC
  Administered 2017-11-16 – 2017-11-18 (×4): 80 mg via INTRAVENOUS
  Filled 2017-11-16 (×4): qty 2

## 2017-11-16 MED ORDER — NALOXONE HCL 0.4 MG/ML IJ SOLN
0.4000 mg | Freq: Once | INTRAMUSCULAR | Status: AC
  Administered 2017-11-16: 0.4 mg via INTRAVENOUS
  Filled 2017-11-16: qty 1

## 2017-11-16 MED ORDER — ACETAMINOPHEN 650 MG RE SUPP: 650.0000 mg | Freq: Four times a day (QID) | RECTAL | Status: DC | PRN

## 2017-11-16 MED ORDER — SODIUM CHLORIDE 0.9% FLUSH
3.0000 mL | Freq: Two times a day (BID) | INTRAVENOUS | Status: DC
  Administered 2017-11-17 – 2017-11-19 (×5): 3 mL via INTRAVENOUS

## 2017-11-16 MED ORDER — LEVALBUTEROL HCL 0.63 MG/3ML IN NEBU
0.6300 mg | INHALATION_SOLUTION | Freq: Four times a day (QID) | RESPIRATORY_TRACT | Status: DC | PRN
Start: 2017-11-16 — End: 2017-11-19
  Administered 2017-11-17 (×2): 0.63 mg via RESPIRATORY_TRACT
  Filled 2017-11-16 (×2): qty 3

## 2017-11-16 MED ORDER — ONDANSETRON HCL 4 MG/2ML IJ SOLN: 4.0000 mg | Freq: Four times a day (QID) | INTRAMUSCULAR | Status: DC | PRN

## 2017-11-16 MED ORDER — CITALOPRAM HYDROBROMIDE 20 MG PO TABS
20.0000 mg | ORAL_TABLET | Freq: Every day | ORAL | Status: DC
  Administered 2017-11-16 – 2017-11-18 (×3): 20 mg via ORAL
  Filled 2017-11-16: qty 2
  Filled 2017-11-16 (×2): qty 1

## 2017-11-16 MED ORDER — SODIUM CHLORIDE 0.9 % IV SOLN: 250.0000 mL | INTRAVENOUS | Status: DC | PRN

## 2017-11-16 MED ORDER — TAMSULOSIN HCL 0.4 MG PO CAPS
0.4000 mg | ORAL_CAPSULE | Freq: Every day | ORAL | Status: DC
  Administered 2017-11-16 – 2017-11-18 (×3): 0.4 mg via ORAL
  Filled 2017-11-16 (×3): qty 1

## 2017-11-16 MED ORDER — PANTOPRAZOLE SODIUM 40 MG PO TBEC
40.0000 mg | DELAYED_RELEASE_TABLET | Freq: Every day | ORAL | Status: DC
  Administered 2017-11-17 – 2017-11-19 (×3): 40 mg via ORAL
  Filled 2017-11-16 (×4): qty 1

## 2017-11-16 MED ORDER — NITROGLYCERIN 2 % TD OINT
0.5000 [in_us] | TOPICAL_OINTMENT | Freq: Once | TRANSDERMAL | Status: AC
  Administered 2017-11-16: 0.5 [in_us] via TOPICAL
  Filled 2017-11-16: qty 1

## 2017-11-16 MED ORDER — APIXABAN 5 MG PO TABS
5.0000 mg | ORAL_TABLET | Freq: Two times a day (BID) | ORAL | Status: DC
  Administered 2017-11-16 – 2017-11-19 (×6): 5 mg via ORAL
  Filled 2017-11-16 (×6): qty 1

## 2017-11-16 MED ORDER — ONDANSETRON HCL 4 MG PO TABS: 4.0000 mg | ORAL_TABLET | Freq: Four times a day (QID) | ORAL | Status: DC | PRN

## 2017-11-16 MED ORDER — FUROSEMIDE 10 MG/ML IJ SOLN
40.0000 mg | Freq: Once | INTRAMUSCULAR | Status: AC
  Administered 2017-11-16: 40 mg via INTRAVENOUS
  Filled 2017-11-16: qty 4

## 2017-11-16 MED ORDER — METHYLPREDNISOLONE SODIUM SUCC 125 MG IJ SOLR
125.0000 mg | Freq: Once | INTRAMUSCULAR | Status: AC
  Administered 2017-11-16: 125 mg via INTRAVENOUS
  Filled 2017-11-16: qty 2

## 2017-11-16 MED ORDER — ALBUTEROL (5 MG/ML) CONTINUOUS INHALATION SOLN
10.0000 mg/h | INHALATION_SOLUTION | Freq: Once | RESPIRATORY_TRACT | Status: AC
  Administered 2017-11-16: 10 mg/h via RESPIRATORY_TRACT
  Filled 2017-11-16: qty 20

## 2017-11-16 NOTE — Treatment Plan (Signed)
Shown ABG by RT- CO2 now progressively improved-Looked in on patient--states that he is being treated for metastatic prostate cancer on Zytiga-has been given IV Lasix since he has been admitted this morning and feels overall better Had his first meal Blood pressure (!) 181/85, pulse 70, temperature 97.6 F (36.4 C), temperature source Axillary, resp. rate 20, height 5' 10.5" (1.791 m), weight 124.3 kg (274 lb), SpO2 94 %.  On examination awake alert no increased work of breathing bilateral macular areas on chest with plaque No audible wheeze Grade 3 lower extremity edema  p Continue diuresis, patient has no chest pain therefore would not worry about troponin at this time unless something changes, last BNP was 55 today is 260 and with lower extremity swelling he will need the echo read and further work-up Continue Lasix as per admitting physician  Verneita Griffes, MD [facility] [phone]

## 2017-11-16 NOTE — Telephone Encounter (Signed)
Pt currently admitted to hospital 

## 2017-11-16 NOTE — ED Provider Notes (Signed)
[facility] DEPT Provider Note   CSN: [id number] Arrival date & time: 11/16/17  0258     History   Chief Complaint No chief complaint on file.   HPI Scott Rollins is a 67 y.o. male.  The history is provided by the EMS personnel. The history is limited by the condition of the patient.  Shortness of Breath  This is a recurrent problem. The problem occurs continuously.The current episode started yesterday. The problem has not changed since onset.Associated symptoms include wheezing and leg swelling. Pertinent negatives include no fever, no cough, no hemoptysis, no chest pain and no vomiting. It is unknown what precipitated the problem. He has tried beta-agonist inhalers for the symptoms. The treatment provided no relief. Associated medical issues include COPD, CAD and heart failure.    Past Medical History:  Diagnosis Date  . Anginal pain ([place])   . Anxiety   . Arthritis    "hips, knees, thoracic back" (01/22/2017)  . CAP (community acquired pneumonia) 11/2016  . 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 COPD (chronic obstructive pulmonary disease) ([place])    "pearl study" pt thinks he is on Symbicort; pt noncompliant with COPD controller meds on/off due to financial reasons.  . Coronary atherosclerosis of unspecified type of vessel, native or graft 2005   MI, s/p PCI with stent (pt reports 20+ interventions in the past, most recent cat 6/08 showed patent stents).  Normal LV function.  Nuclear stress test NEG 8/09.  . Depression    ?bipolar dx by psychiatrist?  . Diverticulosis 2006  . ED (erectile dysfunction)   . Gout   . Gouty arthritis    Dr. Amil Amen  . Heart murmur   . Hematochezia 09/2012   Hyperplastic polyp, diverticulosis, and internal hemorrhoids found on colonoscopy 10/2012  . History of hiatal hernia   . Hyperkalemia 01/29/2017   Kayexalate 30 given x 1.  . Hyperlipidemia    Hx of multiple statin  intolerance  . Hypertension   . 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 Migraine    "none in the 2000s" (01/15/2017)  . Mitral valve prolapse ~ 1981  . Myocardial infarction [facility]) ~ 1993- 2005 X 3-4   (01/15/2017)  . Neuropathic pain of both feet 2015/2016   Vit B12 and A1c normal 10/2014  . Obesities, morbid ([place])   . On home oxygen therapy    "2L prn" (01/15/2017)  . OSA on CPAP    w/oxygen (01/15/2017)  . Osteoarthrosis, unspecified whether generalized or localized, unspecified site    DDD  . PAF (paroxysmal atrial fibrillation) ([place]) 12/2016   Started in the context of resp illness/lots of bronchodilators.  Started eliquis and cardizem during hospitalization 01/2017 (Dr. Rayann Heman).  . Pre-diabetes 2016/17  . Prostate cancer metastatic to multiple sites [facility]) 09/2010; 2018   2012: Localized, high risk disease (Dr. Kimbrough/[place]):  s/p 5 wks ext bm rad + seed boost, as well as hormone blockade x 1 yr.  Biochemical recurrence 11/2015;  2018 imaging showed bone mets and lymph node involvement.  Palliative Lupron and zytiga 2018/19--good PSA response as of 09/2017 onc f/u.  Marland Kitchen Radiation    Pelvic--for prostate ca: 25 treatments/01/2011  . Seizure disorder ([place]) 08-15-2011   Deja vu sensations: no w/u.  These stopped when neurontin 300mg  tid was started for  a different reason.  . Status post chemotherapy    10/2010 thru 06/2011  . Tobacco dependence    still smoking as of fall 2018.    Patient Active Problem List   Diagnosis Date Noted  . Prostate cancer metastatic to bone ([place])   . Acute on chronic respiratory failure ([place]) 01/21/2017  . Acute kidney injury ([place]) 01/21/2017  . Abnormal CT of the chest 01/21/2017  . Obesity hypoventilation syndrome ([place])   . SOB (shortness of breath) 01/16/2017  . Atrial fibrillation ([place]) 01/15/2017  . Community acquired pneumonia of right lower  lobe of lung ([place]) 11/05/2016  . Hyponatremia 11/05/2016  . OSA (obstructive sleep apnea) 11/05/2016  . COPD with acute exacerbation ([place]) 11/04/2016  . 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 COPD (chronic obstructive pulmonary disease) ([place]) 04/19/2014  . Chronic pain syndrome 01/11/2014  . Chronic gout 04/22/2013  . Health maintenance examination 04/29/2012  . Prostate cancer ([place]) 09/12/2010  . MIXED HYPERLIPIDEMIA 08/27/2010  . ELEVATED PROSTATE SPECIFIC ANTIGEN 08/26/2010  . 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 HEMATURIA, HX OF 08/21/2010  . TOBACCO ABUSE 07/11/2010  . NUMMULAR ECZEMA 06/24/2010  . EMPHYSEMA 04/16/2009  . DYSPNEA 04/10/2009  . OBESITY 04/09/2009  . Essential hypertension 04/09/2009  . Coronary artery disease due to lipid rich plaque 04/09/2009  . DEGENERATIVE JOINT DISEASE 04/09/2009    Past Surgical History:  Procedure Laterality Date  . CARDIAC CATHETERIZATION  23 caths  . COLONOSCOPY WITH PROPOFOL N/A 04/05/2013   Dr. Fuller Plan.  Polypectomy (hyperplastic--recall 10 yrs).  Moderate diverticulosis, +internal hemorrhoids.  No radiation proctitis.  . CORONARY ANGIOPLASTY    . CORONARY ANGIOPLASTY WITH STENT PLACEMENT     "5-6 stents" (01/15/2017)  . HERNIA REPAIR    . HOT HEMOSTASIS N/A 04/05/2013   Procedure: HOT HEMOSTASIS (ARGON PLASMA COAGULATION/BICAP);  Surgeon: Ladene Artist, MD;  Location: [facility];  Service: Endoscopy;  Laterality: N/A;  . INSERTION PROSTATE RADIATION SEED  02/24/2011  . TRANSTHORACIC ECHOCARDIOGRAM  12/2016   EF 55-60%, moderate LVH, grd I DD, mild LA dilation.  Marland Kitchen UMBILICAL HERNIA REPAIR          Home Medications    Prior to Admission medications   Medication Sig Start Date End Date Taking? Authorizing Provider  abiraterone acetate (ZYTIGA) 250 MG tablet Take 4 tablets (1,000 mg total) by mouth daily. Take on an empty stomach 1 hour before or 2 hours after a meal 11/13/17   Wyatt Portela, MD  acetaminophen (TYLENOL) 500 MG  tablet Take 2 tablets (1,000 mg total) by mouth 3 (three) times daily. 01/25/17   Geradine Girt, DO  albuterol (PROAIR HFA) 108 (90 Base) MCG/ACT inhaler Inhale 2 puffs into the lungs every 6 (six) hours as needed for wheezing or shortness of breath. 11/04/17   [name], [name], MD  albuterol (PROVENTIL) (2.5 MG/3ML) 0.083% nebulizer solution Take 3 mLs (2.5 mg total) by nebulization every 4 (four) hours as needed for wheezing or shortness of breath. 11/04/17   [name], [name], MD  allopurinol (ZYLOPRIM) 300 MG tablet Take 1 tablet (300 mg total) by mouth daily. 12/17/16   [name], [name], MD  apixaban (ELIQUIS) 5 MG TABS tablet Take 1 tablet (5 mg total) by mouth 2 (two) times daily. 09/28/17   [name], [name], MD  bisoprolol-hydrochlorothiazide (ZIAC) 5-6.25 MG tablet Take 1 tablet by mouth every morning. 04/02/17   Fay Records, MD  budesonide-formoterol [facility]) 160-4.5 MCG/ACT inhaler Inhale 2 puffs into the lungs  2 (two) times daily. 01/28/17   [name], [name], MD  citalopram (CELEXA) 40 MG tablet Take 0.5 tablets (20 mg total) by mouth at bedtime. 01/29/17   [name], [name], MD  cyclobenzaprine (FLEXERIL) 10 MG tablet Take 1 tablet (10 mg total) by mouth at bedtime. 01/29/17   [name], [name], MD  dextromethorphan-guaiFENesin (MUCINEX DM) 30-600 MG 12hr tablet Take 1 tablet by mouth 2 (two) times daily as needed for cough.    [provider]  diltiazem (CARDIZEM) 30 MG tablet Take 1 tablet (30 mg total) by mouth every 12 (twelve) hours. 04/02/17   Fay Records, MD  feeding supplement, ENSURE ENLIVE, (ENSURE ENLIVE) LIQD Take 237 mLs by mouth 2 (two) times daily between meals. 01/17/17   Hosie Poisson, MD  furosemide (LASIX) 40 MG tablet Take 2 tablets (80 mg total) by mouth daily as needed. Patient taking differently: Take 80 mg by mouth daily as needed for fluid.  12/10/16   [name], [name], MD  gabapentin (NEURONTIN) 300 MG capsule Take 1 capsule (300 mg total) by mouth 3  (three) times daily. 03/26/17   [name], [name], MD  hydrochlorothiazide (HYDRODIURIL) 25 MG tablet Take 1 tablet (25 mg total) by mouth daily. 04/16/17   [name], [name], MD  Oxycodone HCl 10 MG TABS Take 1 tablet (10 mg total) by mouth every 8 (eight) hours as needed. 11/05/17   Wyatt Portela, MD  pantoprazole (PROTONIX) 40 MG tablet Take 1 tablet (40 mg total) by mouth daily. 04/16/17   [name], [name], MD  potassium chloride SA (K-DUR,KLOR-CON) 20 MEQ tablet Take 1 tablet (20 mEq total) by mouth daily. 11/05/17   Wyatt Portela, MD  predniSONE (DELTASONE) 5 MG tablet TAKE 1 TABLET BY MOUTH EVERY DAY WITH BREAKFAST 10/12/17   Wyatt Portela, MD  Probiotic Product (PROBIOTIC PO) Take 1 capsule by mouth daily.    [provider]  prochlorperazine (COMPAZINE) 10 MG tablet Take 1 tablet (10 mg total) by mouth every 6 (six) hours as needed for nausea or vomiting. 11/13/17   Wyatt Portela, MD  tamsulosin (FLOMAX) 0.4 MG CAPS capsule Take 1 capsule (0.4 mg total) by mouth daily after supper. 11/13/17   [name], [name], MD  traMADol (ULTRAM) 50 MG tablet Take 1 tablet (50 mg total) by mouth every 6 (six) hours as needed. 11/05/17   Wyatt Portela, MD  zolpidem (AMBIEN) 10 MG tablet Take 1 tablet (10 mg total) by mouth at bedtime as needed for sleep. 01/29/17   [name], [name], MD    Family History Family History  Problem Relation Age of Onset  . Arthritis Mother   . Heart disease Father   . Bipolar disorder Father     Social History Social History   Tobacco Use  . Smoking status: Former Smoker    Packs/day: 0.00    Years: 54.00    Pack years: 0.00    Types: Cigarettes    Last attempt to quit: 12/31/2016    Years since quitting: 0.8  . Smokeless tobacco: Never Used  Substance Use Topics  . Alcohol use: Yes    Comment: 01/22/2017 "once q 2-3 months; a beer or glass of wine"  . Drug use: Yes    Types: Marijuana    Comment: 01/22/2017 "nothing in the last several weeks"      Allergies   Amitriptyline hcl and Ezetimibe   Review of Systems Review of Systems  Unable to perform  ROS: Acuity of condition  Constitutional: Positive for diaphoresis. Negative for fever.  Respiratory: Positive for shortness of breath and wheezing. Negative for cough and hemoptysis.   Cardiovascular: Positive for leg swelling. Negative for chest pain.  Gastrointestinal: Negative for vomiting.     Physical Exam Updated Vital Signs BP (!) 156/92 (BP Location: Right Arm)   Pulse 87   Temp 97.6 F (36.4 C) (Axillary)   Resp 18   Ht 5' 10.5" (1.791 m)   Wt 124.3 kg (274 lb)   SpO2 99%   BMI 38.76 kg/m   Physical Exam  Constitutional: He appears well-developed and well-nourished. He appears distressed.  HENT:  Head: Normocephalic and atraumatic.  Eyes: Pupils are equal, round, and reactive to light. Conjunctivae are normal.  Neck: Normal range of motion. Neck supple.  Cardiovascular: Normal rate, regular rhythm, normal heart sounds and intact distal pulses.  Pulmonary/Chest: Accessory muscle usage present. He is in respiratory distress. He has decreased breath sounds. He has wheezes. He has rales.  Abdominal: Soft. Bowel sounds are normal. He exhibits no mass. There is no tenderness. There is no rebound and no guarding.  Musculoskeletal: He exhibits edema.  Neurological: He is alert. He displays normal reflexes.  Skin: Skin is warm and dry. Capillary refill takes less than 2 seconds. He is not diaphoretic.  Psychiatric:  unable     ED Treatments / Results  Labs (all labs ordered are listed, but only abnormal results are displayed) Results for orders placed or performed during the hospital encounter of 11/16/17  Blood gas, arterial ([facility] & [facility] ONLY)  Result Value Ref Range   FIO2 50.00    Delivery systems BILEVEL POSITIVE AIRWAY PRESSURE    LHR 6 resp/min   Peep/cpap 5.0 cm H20   Pressure control 10.0 cm H20   pH, Arterial 7.293 (L) 7.350 - 7.450   pCO2 arterial  94.2 (HH) 32.0 - 48.0 mmHg   pO2, Arterial 84.1 83.0 - 108.0 mmHg   Bicarbonate 44.2 (H) 20.0 - 28.0 mmol/L   Acid-Base Excess 13.6 (H) 0.0 - 2.0 mmol/L   O2 Saturation 95.4 %   Patient temperature 98.6    Collection site LEFT RADIAL    Drawn by [id number]    Sample type ARTERIAL DRAW    Allens test (pass/fail) PASS PASS  CBC with Differential/Platelet  Result Value Ref Range   WBC 8.8 4.0 - 10.5 K/uL   RBC 4.72 4.22 - 5.81 MIL/uL   Hemoglobin 13.7 13.0 - 17.0 g/dL   HCT 45.1 39.0 - 52.0 %   MCV 95.6 78.0 - 100.0 fL   MCH 29.0 26.0 - 34.0 pg   MCHC 30.4 30.0 - 36.0 g/dL   RDW 14.1 11.5 - 15.5 %   Platelets 126 (L) 150 - 400 K/uL   Neutrophils Relative % 83 %   Neutro Abs 7.3 1.7 - 7.7 K/uL   Lymphocytes Relative 10 %   Lymphs Abs 0.9 0.7 - 4.0 K/uL   Monocytes Relative 5 %   Monocytes Absolute 0.4 0.1 - 1.0 K/uL   Eosinophils Relative 2 %   Eosinophils Absolute 0.2 0.0 - 0.7 K/uL   Basophils Relative 0 %   Basophils Absolute 0.0 0.0 - 0.1 K/uL  I-stat chem 8, ed  Result Value Ref Range   Sodium 140 135 - 145 mmol/L   Potassium 3.1 (L) 3.5 - 5.1 mmol/L   Chloride 89 (L) 101 - 111 mmol/L   BUN 9 6 - 20 mg/dL  Creatinine, Ser 0.80 0.61 - 1.24 mg/dL   Glucose, Bld 104 (H) 65 - 99 mg/dL   Calcium, Ion 1.08 (L) 1.15 - 1.40 mmol/L   TCO2 44 (H) 22 - 32 mmol/L   Hemoglobin 15.0 13.0 - 17.0 g/dL   HCT 44.0 39.0 - 52.0 %   Dg Chest Portable 1 View  Result Date: 11/16/2017 CLINICAL DATA:  Dyspnea, increase shortness of breath this morning. History of coronary artery disease, emphysema, former smoker. Obesity. EXAM: PORTABLE CHEST 1 VIEW COMPARISON:  Portable chest x-ray of January 22, 2017 FINDINGS: The lungs are mildly hyperinflated. The interstitial markings are coarse especially the bases but are stable. There is no alveolar infiltrate or pleural effusion. The cardiac silhouette is enlarged. The central pulmonary vascularity is engorged and more prominent today. The mediastinum  exhibits mild widening but this is stable. IMPRESSION: Findings consistent with low-grade CHF superimposed upon COPD. No alveolar pneumonia. Electronically Signed   By: David  Martinique M.D.   On: 11/16/2017 07:31  \  EKG EKG Interpretation  Date/Time:  Monday Nov 16 2017 06:53:40 EDT Ventricular Rate:  87 PR Interval:    QRS Duration: 102 QT Interval:  414 QTC Calculation: 499 R Axis:   53 Text Interpretation:  Sinus rhythm Borderline repolarization abnormality Borderline prolonged QT interval Confirmed by Dory Horn) on 11/16/2017 6:58:31 AM   Radiology No results found.  Procedures Procedures (including critical care time)  Medications Ordered in ED Medications  methylPREDNISolone sodium succinate (SOLU-MEDROL) 125 mg/2 mL injection 125 mg (has no administration in time range)  furosemide (LASIX) injection 40 mg (has no administration in time range)  nitroGLYCERIN (NITROGLYN) 2 % ointment 0.5 inch (0.5 inches Topical Given 11/16/17 0726)  albuterol (PROVENTIL,VENTOLIN) solution continuous neb (10 mg/hr Nebulization Given 11/16/17 0727)   MDM Reviewed: previous chart, nursing note and vitals Reviewed previous: x-ray Interpretation: ECG, labs and x-ray (hypercapnia, CHF by me on cxr hypokalemia) Total time providing critical care: 30-74 minutes (bipap initiated by me). This excludes time spent performing separately reportable procedures and services. Consults: admitting MD  CRITICAL CARE Performed by: Carlisle Beers Total critical care time: 61  minutes Critical care time was exclusive of separately billable procedures and treating other patients. Critical care was necessary to treat or prevent imminent or life-threatening deterioration. Critical care was time spent personally by me on the following activities: development of treatment plan with patient and/or surrogate as well as nursing, discussions with consultants, evaluation of patient's response to treatment,  examination of patient, obtaining history from patient or surrogate, ordering and performing treatments and interventions, ordering and review of laboratory studies, ordering and review of radiographic studies, pulse oximetry and re-evaluation of patient's condition. DEA database:    10/14/2017 2 09/29/2017 Tramadol Hcl 50 Mg Tablet 60 15 Fi Sha [id number] Nor ([id number]) 0 20.00 MME Private Pay [place] 10/14/2017 2 09/29/2017 Oxycodone Hcl 10 Mg Tablet 90 30 Fi Sha [id number] Nor ([id number]) 0 45.00 MME Private Pay [place]  Adequate response to 0.4 mg of narcan, markedly improved RR and deeper breaths.   Final Clinical Impressions(s) / ED Diagnoses  Signed out to Dr. Ellender Hose pending BNP and troponin.      Anjel Pardo, MD 11/16/17 [phone]

## 2017-11-16 NOTE — ED Notes (Signed)
BIPAP removed off patient. At this time, recliner provided for patient and patient placed on 3L of O2 via nasal cannula. Patient comfortable and family remains at bedside with patient. Patient given ice chips at this time.

## 2017-11-16 NOTE — Progress Notes (Addendum)
Pt does not have increased WOB and resting comfortably on 3Lpm nasal cannula.  ABG improved.  No BiPAP indicated at this time.  Pt has orders for BiPAP qhs as well.  Spoke with Pt regarding this. Pt states he used to wear it at night but has not for some time now.  Wishes to wear nasal cannula while sleeping tonight.  I told Pt he may need to wear BiPAP qhs for tonight to prevent CO2 from building back up.

## 2017-11-16 NOTE — H&P (Addendum)
[facility] History and Physical  Scott Rollins WVP:[id number] DOB: 05-11-1951 DOA: 11/16/2017    PCP: [name], MD   Chief Complaint:  Shortness of breath  HPI:  67 year old male with a history of metastatic prostrate cancer,COPD, sleep apnea on CPap at home, hypertension , morbid obesity, gastroesophageal reflux disease, atrial fibrillation on Eliquis,chronic pain syndrome on chronic opioids,who presented to the ED today brought in by EMS, for acute respiratory distress associated with wheezing and leg swelling. Patient states that for the last 2 weeks he has been using oxygen during the day which is quite unusual for him. He normally only uses oxygen with a CPap at night.He also has noticed bilateral lower extremity edema. He has tried his inhaler at home to help him with his  shortness of breath,but this has not helped .He has also noticed redness and erythema of the right lower extremity. He states that he sustained trauma to his right lower extremity, right shin from walking into the edge of his bed, and since then his right lower extremity has been red. He denies any recent use of antibiotics for this. ED course BP (!) 156/92 (BP Location: Right Arm)   Pulse 87   Temp 97.6 F (36.4 C) (Axillary)   Resp 18   Ht 5' 10.5" (1.791 m)   Wt 124.3 kg (274 lb)   SpO2 99%   BMI 38.76 kg/m  Patient found to be in moderate to severe respiratory distress Chest x-ray consistent with congestive heart failure Patient found to have a blood gas with a pH of 7.29,PCO2 of 94, PO2 of 84, patient started on BiPAP in the ED He also received IV Solu-Medrol, IV Lasix, nitroglycerin ointment, continuous albuterol neb        Review of Systems: negative for the following  Constitutional: Denies fever, chills,  , appetite change and  Found to be fatigued and diaphoretic HEENT: Denies photophobia, eye pain, redness, hearing loss, ear pain, congestion, sore throat, rhinorrhea, sneezing,  mouth sores, trouble swallowing, neck pain, neck stiffness and tinnitus.  Respiratory: Positive for shortness of breath and wheezing. Negative for cough and hemoptysis.  Cardiovascular: Denies chest pain, palpitations and leg swelling.  Gastrointestinal: Denies nausea, vomiting, abdominal pain, diarrhea, constipation, blood in stool and abdominal distention.  Genitourinary: Denies dysuria, urgency, frequency, hematuria, flank pain and difficulty urinating.  Musculoskeletal: Denies myalgias, back pain, joint swelling, arthralgias and gait problem.  Skin: Denies pallor, rash and wound. Right leg  Neurological: Denies dizziness, seizures, syncope, weakness, light-headedness, numbness and headaches.  Hematological: Denies adenopathy. Easy bruising, personal or family bleeding history  Psychiatric/Behavioral: Denies suicidal ideation, mood changes, confusion, nervousness, sleep disturbance and agitation       Past Medical History:  Diagnosis Date  . Anginal pain ([place])   . Anxiety   . Arthritis    "hips, knees, thoracic back" (01/22/2017)  . CAP (community acquired pneumonia) 11/2016  . 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 COPD (chronic obstructive pulmonary disease) ([place])    "pearl study" pt thinks he is on Symbicort; pt noncompliant with COPD controller meds on/off due to financial reasons.  . Coronary atherosclerosis of unspecified type of vessel, native or graft 2005   MI, s/p PCI with stent (pt reports 20+ interventions in the past, most recent cat 6/08 showed patent stents).  Normal LV function.  Nuclear stress test NEG 8/09.  . Depression    ?bipolar dx by psychiatrist?  . Diverticulosis  2006  . ED (erectile dysfunction)   . Gout   . Gouty arthritis    Dr. Amil Amen  . Heart murmur   . Hematochezia 09/2012   Hyperplastic polyp, diverticulosis, and internal hemorrhoids found on colonoscopy 10/2012  . History of hiatal hernia   . Hyperkalemia  01/29/2017   Kayexalate 30 given x 1.  . Hyperlipidemia    Hx of multiple statin intolerance  . Hypertension   . 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 Migraine    "none in the 2000s" (01/15/2017)  . Mitral valve prolapse ~ 1981  . Myocardial infarction [facility]) ~ 1993- 2005 X 3-4   (01/15/2017)  . Neuropathic pain of both feet 2015/2016   Vit B12 and A1c normal 10/2014  . Obesities, morbid ([place])   . On home oxygen therapy    "2L prn" (01/15/2017)  . OSA on CPAP    w/oxygen (01/15/2017)  . Osteoarthrosis, unspecified whether generalized or localized, unspecified site    DDD  . PAF (paroxysmal atrial fibrillation) ([place]) 12/2016   Started in the context of resp illness/lots of bronchodilators.  Started eliquis and cardizem during hospitalization 01/2017 (Dr. Rayann Heman).  . Pre-diabetes 2016/17  . Prostate cancer metastatic to multiple sites [facility]) 09/2010; 2018   2012: Localized, high risk disease (Dr. Kimbrough/[place]):  s/p 5 wks ext bm rad + seed boost, as well as hormone blockade x 1 yr.  Biochemical recurrence 11/2015;  2018 imaging showed bone mets and lymph node involvement.  Palliative Lupron and zytiga 2018/19--good PSA response as of 09/2017 onc f/u.  [facility] Kitchen Radiation    Pelvic--for prostate ca: 25 treatments/01/2011  . Seizure disorder ([place]) 08-15-2011   Deja vu sensations: no w/u.  These stopped when neurontin 300mg  tid was started for a different reason.  . Status post chemotherapy    10/2010 thru 06/2011  . Tobacco dependence    still smoking as of fall 2018.     Past Surgical History:  Procedure Laterality Date  . CARDIAC CATHETERIZATION  23 caths  . COLONOSCOPY WITH PROPOFOL N/A 04/05/2013   Dr. Fuller Plan.  Polypectomy (hyperplastic--recall 10 yrs).  Moderate diverticulosis, +internal hemorrhoids.  No radiation proctitis.  . CORONARY ANGIOPLASTY    . CORONARY ANGIOPLASTY WITH STENT  PLACEMENT     "5-6 stents" (01/15/2017)  . HERNIA REPAIR    . HOT HEMOSTASIS N/A 04/05/2013   Procedure: HOT HEMOSTASIS (ARGON PLASMA COAGULATION/BICAP);  Surgeon: Ladene Artist, MD;  Location: [facility];  Service: Endoscopy;  Laterality: N/A;  . INSERTION PROSTATE RADIATION SEED  02/24/2011  . TRANSTHORACIC ECHOCARDIOGRAM  12/2016   EF 55-60%, moderate LVH, grd I DD, mild LA dilation.  [facility] UMBILICAL HERNIA REPAIR        Social History:  reports that he quit smoking about 10 months ago. His smoking use included cigarettes. He smoked 0.00 packs per day for 54.00 years. He has never used smokeless tobacco. He reports that he drinks alcohol. He reports that he has current or past drug history. Drug: Marijuana.    Allergies  Allergen Reactions  . Amitriptyline Hcl Nausea Only    REACTION: nausea, elevated bp  . Ezetimibe Other (See Comments)    REACTION: chest pain, fatigue    Family History  Problem Relation Age of Onset  . Arthritis Mother   . Heart disease Father   . Bipolar disorder Father  Prior to Admission medications   Medication Sig Start Date End Date Taking? Authorizing Provider  abiraterone acetate (ZYTIGA) 250 MG tablet Take 4 tablets (1,000 mg total) by mouth daily. Take on an empty stomach 1 hour before or 2 hours after a meal 11/13/17   Wyatt Portela, MD  acetaminophen (TYLENOL) 500 MG tablet Take 2 tablets (1,000 mg total) by mouth 3 (three) times daily. 01/25/17   Geradine Girt, DO  albuterol (PROAIR HFA) 108 (90 Base) MCG/ACT inhaler Inhale 2 puffs into the lungs every 6 (six) hours as needed for wheezing or shortness of breath. 11/04/17   [name], [name], MD  albuterol (PROVENTIL) (2.5 MG/3ML) 0.083% nebulizer solution Take 3 mLs (2.5 mg total) by nebulization every 4 (four) hours as needed for wheezing or shortness of breath. 11/04/17   [name], [name], MD  allopurinol (ZYLOPRIM) 300 MG tablet Take 1 tablet (300 mg total) by mouth daily. 12/17/16    [name], [name], MD  apixaban (ELIQUIS) 5 MG TABS tablet Take 1 tablet (5 mg total) by mouth 2 (two) times daily. 09/28/17   [name], [name], MD  bisoprolol-hydrochlorothiazide (ZIAC) 5-6.25 MG tablet Take 1 tablet by mouth every morning. 04/02/17   Fay Records, MD  budesonide-formoterol [facility]) 160-4.5 MCG/ACT inhaler Inhale 2 puffs into the lungs 2 (two) times daily. 01/28/17   [name], [name], MD  citalopram (CELEXA) 40 MG tablet Take 0.5 tablets (20 mg total) by mouth at bedtime. 01/29/17   [name], [name], MD  cyclobenzaprine (FLEXERIL) 10 MG tablet Take 1 tablet (10 mg total) by mouth at bedtime. 01/29/17   [name], [name], MD  dextromethorphan-guaiFENesin (MUCINEX DM) 30-600 MG 12hr tablet Take 1 tablet by mouth 2 (two) times daily as needed for cough.    [provider]  diltiazem (CARDIZEM) 30 MG tablet Take 1 tablet (30 mg total) by mouth every 12 (twelve) hours. 04/02/17   Fay Records, MD  feeding supplement, ENSURE ENLIVE, (ENSURE ENLIVE) LIQD Take 237 mLs by mouth 2 (two) times daily between meals. 01/17/17   Hosie Poisson, MD  furosemide (LASIX) 40 MG tablet Take 2 tablets (80 mg total) by mouth daily as needed. Patient taking differently: Take 80 mg by mouth daily as needed for fluid.  12/10/16   [name], [name], MD  gabapentin (NEURONTIN) 300 MG capsule Take 1 capsule (300 mg total) by mouth 3 (three) times daily. 03/26/17   [name], [name], MD  hydrochlorothiazide (HYDRODIURIL) 25 MG tablet Take 1 tablet (25 mg total) by mouth daily. 04/16/17   [name], [name], MD  Oxycodone HCl 10 MG TABS Take 1 tablet (10 mg total) by mouth every 8 (eight) hours as needed. 11/05/17   Wyatt Portela, MD  pantoprazole (PROTONIX) 40 MG tablet Take 1 tablet (40 mg total) by mouth daily. 04/16/17   [name], [name], MD  potassium chloride SA (K-DUR,KLOR-CON) 20 MEQ tablet Take 1 tablet (20 mEq total) by mouth daily. 11/05/17   Wyatt Portela, MD  predniSONE (DELTASONE) 5 MG tablet  TAKE 1 TABLET BY MOUTH EVERY DAY WITH BREAKFAST 10/12/17   Wyatt Portela, MD  Probiotic Product (PROBIOTIC PO) Take 1 capsule by mouth daily.    [provider]  prochlorperazine (COMPAZINE) 10 MG tablet Take 1 tablet (10 mg total) by mouth every 6 (six) hours as needed for nausea or vomiting. 11/13/17   Wyatt Portela, MD  tamsulosin (FLOMAX) 0.4 MG CAPS capsule Take 1 capsule (  0.4 mg total) by mouth daily after supper. 11/13/17   [name], [name], MD  traMADol (ULTRAM) 50 MG tablet Take 1 tablet (50 mg total) by mouth every 6 (six) hours as needed. 11/05/17   Wyatt Portela, MD  zolpidem (AMBIEN) 10 MG tablet Take 1 tablet (10 mg total) by mouth at bedtime as needed for sleep. 01/29/17   [name], MD     Physical Exam: Vitals:   11/16/17 0656 11/16/17 0727 11/16/17 0815 11/16/17 0900  BP: (!) 156/92  (!) 182/81 (!) 169/72  Pulse: 87 81 81 82  Resp: 18 18 18 19   Temp: 97.6 F (36.4 C)     TempSrc: Axillary     SpO2: 99% 99% 100% 97%  Weight: 124.3 kg (274 lb)     Height: 5' 10.5" (1.791 m)           Vitals:   11/16/17 0656 11/16/17 0727 11/16/17 0815 11/16/17 0900  BP: (!) 156/92  (!) 182/81 (!) 169/72  Pulse: 87 81 81 82  Resp: 18 18 18 19   Temp: 97.6 F (36.4 C)     TempSrc: Axillary     SpO2: 99% 99% 100% 97%  Weight: 124.3 kg (274 lb)     Height: 5' 10.5" (1.791 m)      Constitutional: NAD, calm, comfortable Eyes: PERRL, lids and conjunctivae normal ENMT: Mucous membranes are moist. Posterior pharynx clear of any exudate or lesions.Normal dentition.  Neck: normal, supple, no masses, no thyromegaly Pulmonary/Chest: Accessory muscle usage present. He is in respiratory distress. He has decreased breath sounds. He has wheezes. He has rales.  Cardiovascular: Regular rate and rhythm, no murmurs / rubs / gallops. No extremity edema. 2+ pedal pulses. No carotid bruits.  Abdomen: no tenderness, no masses palpated. No hepatosplenomegaly. Bowel sounds positive.   Musculoskeletal: no clubbing / cyanosis. No joint deformity upper and lower extremities. Good ROM, no contractures. Normal muscle tone.  Skin: right lower extremity erythema and duration with chronic venous stasis changes Neurologic: CN 2-12 grossly intact. Sensation intact, DTR normal. Strength 5/5 in all 4.  Psychiatric: Normal judgment and insight. Alert and oriented x 3. Normal mood.     Labs on Admission: I have personally reviewed following labs and imaging studies  CBC: Recent Labs  Lab 11/16/17 0736 11/16/17 0757  WBC SPECIMEN CLOTTED  --   NEUTROABS SPECIMEN CLOTTED  --   HGB SPECIMEN CLOTTED 15.0  HCT SPECIMEN CLOTTED 44.0  MCV SPECIMEN CLOTTED  --   PLT SPECIMEN CLOTTED  --     Basic Metabolic Panel: Recent Labs  Lab 11/16/17 0757  NA 140  K 3.1*  CL 89*  GLUCOSE 104*  BUN 9  CREATININE 0.80    GFR: Estimated Creatinine Clearance: 117.8 mL/min (by C-G formula based on SCr of 0.8 mg/dL).  Liver Function Tests: No results for input(s): AST, ALT, ALKPHOS, BILITOT, PROT, ALBUMIN in the last 168 hours. No results for input(s): LIPASE, AMYLASE in the last 168 hours. No results for input(s): AMMONIA in the last 168 hours.  Coagulation Profile: No results for input(s): INR, PROTIME in the last 168 hours. No results for input(s): DDIMER in the last 72 hours.  Cardiac Enzymes: No results for input(s): CKTOTAL, CKMB, CKMBINDEX, TROPONINI in the last 168 hours.  BNP (last 3 results) No results for input(s): PROBNP in the last 8760 hours.  HbA1C: No results for input(s): HGBA1C in the last 72 hours. Lab Results  Component Value Date  HGBA1C 5.3 01/15/2017   HGBA1C 6.3 10/31/2016   HGBA1C 6.1 11/15/2015     CBG: No results for input(s): GLUCAP in the last 168 hours.  Lipid Profile: No results for input(s): CHOL, HDL, LDLCALC, TRIG, CHOLHDL, LDLDIRECT in the last 72 hours.  Thyroid Function Tests: No results for input(s): TSH, T4TOTAL, FREET4,  T3FREE, THYROIDAB in the last 72 hours.  Anemia Panel: No results for input(s): VITAMINB12, FOLATE, FERRITIN, TIBC, IRON, RETICCTPCT in the last 72 hours.  Urine analysis:    Component Value Date/Time   COLORURINE YELLOW 01/21/2017 1401   APPEARANCEUR HAZY (A) 01/21/2017 1401   LABSPEC 1.028 01/21/2017 1401   PHURINE 5.0 01/21/2017 1401   GLUCOSEU NEGATIVE 01/21/2017 1401   HGBUR SMALL (A) 01/21/2017 1401   HGBUR negative 08/21/2010 0831   BILIRUBINUR NEGATIVE 01/21/2017 1401   KETONESUR NEGATIVE 01/21/2017 1401   PROTEINUR NEGATIVE 01/21/2017 1401   UROBILINOGEN 0.2 08/21/2010 0831   NITRITE NEGATIVE 01/21/2017 1401   LEUKOCYTESUR NEGATIVE 01/21/2017 1401    Sepsis Labs: @LABRCNTIP (procalcitonin:4,lacticidven:4) )No results found for this or any previous visit (from the past 240 hour(s)).       Radiological Exams on Admission: Dg Chest Portable 1 View  Result Date: 11/16/2017 CLINICAL DATA:  Dyspnea, increase shortness of breath this morning. History of coronary artery disease, emphysema, former smoker. Obesity. EXAM: PORTABLE CHEST 1 VIEW COMPARISON:  Portable chest x-ray of January 22, 2017 FINDINGS: The lungs are mildly hyperinflated. The interstitial markings are coarse especially the bases but are stable. There is no alveolar infiltrate or pleural effusion. The cardiac silhouette is enlarged. The central pulmonary vascularity is engorged and more prominent today. The mediastinum exhibits mild widening but this is stable. IMPRESSION: Findings consistent with low-grade CHF superimposed upon COPD. No alveolar pneumonia. Electronically Signed   By: David  Martinique M.D.   On: 11/16/2017 07:31   Dg Chest Portable 1 View  Result Date: 11/16/2017 CLINICAL DATA:  Dyspnea, increase shortness of breath this morning. History of coronary artery disease, emphysema, former smoker. Obesity. EXAM: PORTABLE CHEST 1 VIEW COMPARISON:  Portable chest x-ray of January 22, 2017 FINDINGS: The lungs are  mildly hyperinflated. The interstitial markings are coarse especially the bases but are stable. There is no alveolar infiltrate or pleural effusion. The cardiac silhouette is enlarged. The central pulmonary vascularity is engorged and more prominent today. The mediastinum exhibits mild widening but this is stable. IMPRESSION: Findings consistent with low-grade CHF superimposed upon COPD. No alveolar pneumonia. Electronically Signed   By: David  Martinique M.D.   On: 11/16/2017 07:31      EKG: Independently reviewed.    Assessment/Plan Principal Problem:   Acute hypercapnic respiratory failure (HCC) aCute hypercapnic hypoxic respiratory failure combination of acute COPD exacerbation and congestive heart failure Patient has been admitted to step down Continue BiPAP and follow blood gas results Continue every 6 nebulizer treatments Continue IV Solu-Medrol IV Lasix He will need pulmonary evaluation on follow-up  Right lower extremity cellulitis Patient has been started on cefazolin for right lower extremity cellulitis Pro calcitonin to guide therapy  Acute on chronic diastolic heart failure We'll continue with Lasix, replete potassium wean off BiPAP Titrate meds Repeat 2-D echo Continue bisoprolol, may need to substitute Lasix for  hydrochlorothiazide at the time of discharge Continue Cardizem  most recent 2-D echo shows EF of 55-60% June 2018  Atrial fibrillation Currently rate controlled, continue Cardizem, Eliquis  Hypertension Continue bisoprolol, hold HCTZ as the patient is also receiving Lasix   Obstructive  sleep apnea continue C Pap  Chronic pain syndrome Minimize narcotics, patient received Narcan on arrival with improvement in his mental status Follow ABG results closely      Prostate cancer (HCC)history of prostrate cancer, patient is followed by Dr. Alen Blew Patient is on Eliquis therefore doubt PE contributing to his presentation    DVT prophylaxis:  E"liquis     Full code     consults called:None  Family Communication: Admission, patients condition and plan of care including tests being ordered have been discussed with the patient  who indicates understanding and agree with the plan and Code Status  Admission status: inpatient    Disposition plan: Further plan will depend as patient's clinical course evolves and further radiologic and laboratory data become available. Likely home when stable   At the time of admission, it appears that the appropriate admission status for this patient is INPATIENT .Thisis judged to be reasonable and necessary in order to provide the required intensity of service to ensure the patient's safetygiven thepresenting symptoms, physical exam findings, and initial radiographic and laboratory data in the context of their chronic comorbidities.   Reyne Dumas MD [facility] Pager [phone]  If 7PM-7AM, please contact night-coverage [email or web address] Password [facility]  11/16/2017, 9:23 AM

## 2017-11-16 NOTE — ED Notes (Signed)
Took pt water, coffee, and meal tray. Approved by RN

## 2017-11-16 NOTE — ED Notes (Signed)
Dr Ellender Hose notified of patients troponin result.

## 2017-11-16 NOTE — ED Notes (Signed)
Failed attempt to collect labs. RN notified °

## 2017-11-17 ENCOUNTER — Ambulatory Visit: Payer: Medicare Other | Admitting: Family Medicine

## 2017-11-17 ENCOUNTER — Inpatient Hospital Stay (HOSPITAL_COMMUNITY): Payer: Medicare Other

## 2017-11-17 DIAGNOSIS — I10 Essential (primary) hypertension: Secondary | ICD-10-CM

## 2017-11-17 DIAGNOSIS — R0600 Dyspnea, unspecified: Secondary | ICD-10-CM

## 2017-11-17 LAB — MAGNESIUM: Magnesium: 1.6 mg/dL — ABNORMAL LOW (ref 1.7–2.4)

## 2017-11-17 LAB — GLUCOSE, CAPILLARY
Glucose-Capillary: 199 mg/dL — ABNORMAL HIGH (ref 65–99)
Glucose-Capillary: 201 mg/dL — ABNORMAL HIGH (ref 65–99)

## 2017-11-17 LAB — COMPREHENSIVE METABOLIC PANEL
ALBUMIN: 3.6 g/dL (ref 3.5–5.0)
ALK PHOS: 68 U/L (ref 38–126)
ALT: 13 U/L — ABNORMAL LOW (ref 17–63)
AST: 24 U/L (ref 15–41)
Anion gap: 14 (ref 5–15)
BILIRUBIN TOTAL: 0.7 mg/dL (ref 0.3–1.2)
BUN: 20 mg/dL (ref 6–20)
CALCIUM: 9.1 mg/dL (ref 8.9–10.3)
CO2: 39 mmol/L — ABNORMAL HIGH (ref 22–32)
Chloride: 87 mmol/L — ABNORMAL LOW (ref 101–111)
Creatinine, Ser: 0.79 mg/dL (ref 0.61–1.24)
GFR calc Af Amer: >60 mL/min (ref >60)
GLUCOSE: 152 mg/dL — AB (ref 65–99)
Potassium: 3.4 mmol/L — ABNORMAL LOW (ref 3.5–5.1)
Sodium: 140 mmol/L (ref 135–145)
Total Protein: 7 g/dL (ref 6.5–8.1)

## 2017-11-17 LAB — CBC
HCT: 44.2 % (ref 39.0–52.0)
Hemoglobin: 13.3 g/dL (ref 13.0–17.0)
MCH: 27.9 pg (ref 26.0–34.0)
MCHC: 30.1 g/dL (ref 30.0–36.0)
MCV: 92.9 fL (ref 78.0–100.0)
PLATELETS: 252 10*3/uL (ref 150–400)
RBC: 4.76 MIL/uL (ref 4.22–5.81)
RDW: 14 % (ref 11.5–15.5)
WBC: 8.5 10*3/uL (ref 4.0–10.5)

## 2017-11-17 LAB — BRAIN NATRIURETIC PEPTIDE: B NATRIURETIC PEPTIDE 5: 183.7 pg/mL — AB (ref 0.0–100.0)

## 2017-11-17 LAB — ECHOCARDIOGRAM COMPLETE
Height: 70 in
Weight: 4423.31 oz

## 2017-11-17 LAB — MRSA PCR SCREENING: MRSA by PCR: NEGATIVE

## 2017-11-17 MED ORDER — BISOPROLOL FUMARATE 5 MG PO TABS
5.0000 mg | ORAL_TABLET | Freq: Every day | ORAL | Status: DC
  Administered 2017-11-18 – 2017-11-19 (×2): 5 mg via ORAL
  Filled 2017-11-17 (×2): qty 1

## 2017-11-17 MED ORDER — MAGNESIUM SULFATE 2 GM/50ML IV SOLN
2.0000 g | Freq: Once | INTRAVENOUS | Status: AC
  Administered 2017-11-17: 2 g via INTRAVENOUS
  Filled 2017-11-17: qty 50

## 2017-11-17 MED ORDER — BUDESONIDE 0.5 MG/2ML IN SUSP
0.5000 mg | Freq: Two times a day (BID) | RESPIRATORY_TRACT | Status: DC
  Administered 2017-11-17 – 2017-11-19 (×5): 0.5 mg via RESPIRATORY_TRACT
  Filled 2017-11-17 (×5): qty 2

## 2017-11-17 MED ORDER — ARFORMOTEROL TARTRATE 15 MCG/2ML IN NEBU
15.0000 ug | INHALATION_SOLUTION | Freq: Two times a day (BID) | RESPIRATORY_TRACT | Status: DC
  Administered 2017-11-17 – 2017-11-18 (×4): 15 ug via RESPIRATORY_TRACT
  Filled 2017-11-17 (×5): qty 2

## 2017-11-17 MED ORDER — HYDRALAZINE HCL 20 MG/ML IJ SOLN
5.0000 mg | Freq: Four times a day (QID) | INTRAMUSCULAR | Status: DC | PRN
  Administered 2017-11-17: 5 mg via INTRAVENOUS
  Filled 2017-11-17: qty 1

## 2017-11-17 MED ORDER — BISOPROLOL FUMARATE 5 MG PO TABS: 10.0000 mg | ORAL_TABLET | Freq: Every day | ORAL | Status: DC

## 2017-11-17 NOTE — Plan of Care (Signed)
  Problem: Education: Goal: Knowledge of General Education information will improve Outcome: Progressing   Problem: Health Behavior/Discharge Planning: Goal: Ability to manage health-related needs will improve Outcome: Progressing   Problem: Activity: Goal: Risk for activity intolerance will decrease Outcome: Progressing   Problem: Nutrition: Goal: Adequate nutrition will be maintained Outcome: Progressing   Problem: Coping: Goal: Level of anxiety will decrease Outcome: Progressing   Problem: Elimination: Goal: Will not experience complications related to urinary retention Outcome: Progressing   Problem: Pain Managment: Goal: General experience of comfort will improve Outcome: Progressing   Problem: Safety: Goal: Ability to remain free from injury will improve Outcome: Progressing   Problem: Skin Integrity: Goal: Risk for impaired skin integrity will decrease Outcome: Progressing

## 2017-11-17 NOTE — Progress Notes (Signed)
Pt. will notify when wanting placed on

## 2017-11-17 NOTE — ED Notes (Signed)
ED TO INPATIENT HANDOFF REPORT  Name/Age/Gender Scott Rollins 67 y.o. male  Code Status    Code Status Orders  (From admission, onward)        Start     Ordered   11/16/17 1915  Full code  Continuous     11/16/17 1914    Code Status History    Date Active Date Inactive Code Status Order ID Comments User Context   01/21/2017 1639 01/25/2017 1501 Full Code [id number]  Radene Gunning, NP ED   01/15/2017 0823 01/17/2017 1935 Full Code [id number]  Rondel Jumbo, PA-C ED   11/05/2016 0146 11/07/2016 1800 Full Code [id number]  Edwin Dada, MD Inpatient      Home/SNF/Other Home  Chief Complaint resp distress  Level of Care/Admitting Diagnosis ED Disposition    ED Disposition Condition [facility]: [facility] [100102]  Level of Care: Stepdown [14]  Admit to SDU based on following criteria: Hemodynamic compromise or significant risk of instability:  Patient requiring short term acute titration and management of vasoactive drips, and invasive monitoring (i.e., CVP and Arterial line).  Admit to SDU based on following criteria: Respiratory Distress:  Frequent assessment and/or intervention to maintain adequate ventilation/respiration, pulmonary toilet, and respiratory treatment.  Diagnosis: Acute exacerbation of chronic obstructive pulmonary disease (COPD) ([place]) [629528]  Admitting Physician: Reyne Dumas [[id number]]  Attending Physician: Reyne Dumas [[id number]]  Estimated length of stay: past midnight tomorrow  Certification:: I certify this patient will need inpatient services for at least 2 midnights  PT Class (Do Not Modify): Inpatient [101]  PT Acc Code (Do Not Modify): Private [1]       Medical History Past Medical History:  Diagnosis Date  . Anginal pain ([place])   . Anxiety   . Arthritis    "hips, knees, thoracic back" (01/22/2017)  . CAP (community acquired pneumonia) 11/2016  . Colon polyp 2006   Santogade; Ganglioneuroma;  consider repeat in 5 years  . COPD (chronic obstructive pulmonary disease) ([place])    "pearl study" pt thinks he is on Symbicort; pt noncompliant with COPD controller meds on/off due to financial reasons.  . Coronary atherosclerosis of unspecified type of vessel, native or graft 2005   MI, s/p PCI with stent (pt reports 20+ interventions in the past, most recent cat 6/08 showed patent stents).  Normal LV function.  Nuclear stress test NEG 8/09.  . Depression    ?bipolar dx by psychiatrist?  . Diverticulosis 2006  . ED (erectile dysfunction)   . Gout   . Gouty arthritis    Dr. Amil Amen  . Heart murmur   . Hematochezia 09/2012   Hyperplastic polyp, diverticulosis, and internal hemorrhoids found on colonoscopy 10/2012  . History of hiatal hernia   . Hyperkalemia 01/29/2017   Kayexalate 30 given x 1.  . Hyperlipidemia    Hx of multiple statin intolerance  . Hypertension   . 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 Migraine    "none in the 2000s" (01/15/2017)  . Mitral valve prolapse ~ 1981  . Myocardial infarction [facility]) ~ 1993- 2005 X 3-4   (01/15/2017)  . Neuropathic pain of both feet 2015/2016   Vit B12 and A1c normal 10/2014  . Obesities, morbid ([place])   . On home oxygen therapy    "2L prn" (01/15/2017)  . OSA  on CPAP    w/oxygen (01/15/2017)  . Osteoarthrosis, unspecified whether generalized or localized, unspecified site    DDD  . PAF (paroxysmal atrial fibrillation) ([place]) 12/2016   Started in the context of resp illness/lots of bronchodilators.  Started eliquis and cardizem during hospitalization 01/2017 (Dr. Rayann Heman).  . Pre-diabetes 2016/17  . Prostate cancer metastatic to multiple sites [facility]) 09/2010; 2018   2012: Localized, high risk disease (Dr. Kimbrough/Grapey):  s/p 5 wks ext bm rad + seed boost, as well as hormone blockade x 1 yr.  Biochemical recurrence 11/2015;  2018 imaging  showed bone mets and lymph node involvement.  Palliative Lupron and zytiga 2018/19--good PSA response as of 09/2017 onc f/u.  [facility]    Pelvic--for prostate ca: 25 treatments/01/2011  . Seizure disorder ([place]) 08-15-2011   Deja vu sensations: no w/u.  These stopped when neurontin 300mg  tid was started for a different reason.  . Status post chemotherapy    10/2010 thru 06/2011  . Tobacco dependence    still smoking as of fall 2018.    Allergies Allergies  Allergen Reactions  . Amitriptyline Hcl Nausea Only    REACTION: nausea, elevated bp  . Ezetimibe Other (See Comments)    REACTION: chest pain, fatigue    IV Location/Drains/Wounds Patient Lines/Drains/Airways Status   Active Line/Drains/Airways    Name:   Placement date:   Placement time:   Site:   Days:   Peripheral IV 11/16/17 Right Antecubital   11/16/17    0700    Antecubital   1          Labs/Imaging Results for orders placed or performed during the hospital encounter of 11/16/17 (from the past 48 hour(s))  Blood gas, arterial ([facility] & [facility] ONLY)     Status: Abnormal   Collection Time: 11/16/17  7:26 AM  Result Value Ref Range   FIO2 50.00    Delivery systems BILEVEL POSITIVE AIRWAY PRESSURE    LHR 6 resp/min   Peep/cpap 5.0 cm H20   Pressure control 10.0 cm H20   pH, Arterial 7.293 (L) 7.350 - 7.450   pCO2 arterial 94.2 (HH) 32.0 - 48.0 mmHg    Comment: CRITICAL RESULT CALLED TO, READ BACK BY AND VERIFIED WITH: A.PALUMBO, MD AT [phone] BY M.JESTER, RRT, RCP ON 11/16/17    pO2, Arterial 84.1 83.0 - 108.0 mmHg   Bicarbonate 44.2 (H) 20.0 - 28.0 mmol/L   Acid-Base Excess 13.6 (H) 0.0 - 2.0 mmol/L   O2 Saturation 95.4 %   Patient temperature 98.6    Collection site LEFT RADIAL    Drawn by [id number]    Sample type ARTERIAL DRAW    Allens test (pass/fail) PASS PASS    Comment: Performed at [facility], [place] [address], [place], [place] [address]  CBC with Differential/Platelet     Status: None    Collection Time: 11/16/17  7:36 AM  Result Value Ref Range   WBC SPECIMEN CLOTTED 4.0 - 10.5 K/uL    Comment: CALLED JONES,K AT 0816 ON 11/16/17 BY MOHAMED CORRECTED ON 05/06 AT 0823: PREVIOUSLY REPORTED AS 8.8    RBC SPECIMEN CLOTTED 4.22 - 5.81 MIL/uL    Comment: CORRECTED ON 05/06 AT 0823: PREVIOUSLY REPORTED AS 4.72   Hemoglobin SPECIMEN CLOTTED 13.0 - 17.0 g/dL    Comment: CORRECTED ON 05/06 AT 0823: PREVIOUSLY REPORTED AS 13.7   HCT SPECIMEN CLOTTED 39.0 - 52.0 %    Comment: CORRECTED ON 05/06 AT 0867: PREVIOUSLY REPORTED AS  45.1   MCV SPECIMEN CLOTTED 78.0 - 100.0 fL    Comment: CORRECTED ON 05/06 AT 0823: PREVIOUSLY REPORTED AS 95.6   MCH SPECIMEN CLOTTED 26.0 - 34.0 pg    Comment: CORRECTED ON 05/06 AT 0823: PREVIOUSLY REPORTED AS 29.0   MCHC SPECIMEN CLOTTED 30.0 - 36.0 g/dL    Comment: CORRECTED ON 05/06 AT 0823: PREVIOUSLY REPORTED AS 30.4   RDW SPECIMEN CLOTTED 11.5 - 15.5 %    Comment: CORRECTED ON 05/06 AT 0823: PREVIOUSLY REPORTED AS 14.1   Platelets SPECIMEN CLOTTED 150 - 400 K/uL    Comment: CORRECTED ON 05/06 AT 0823: PREVIOUSLY REPORTED AS 126   Neutrophils Relative % SPECIMEN CLOTTED %    Comment: CORRECTED ON 05/06 AT 5643: PREVIOUSLY REPORTED AS 83   Neutro Abs SPECIMEN CLOTTED 1.7 - 7.7 K/uL    Comment: CORRECTED ON 05/06 AT 3295: PREVIOUSLY REPORTED AS 7.3   Band Neutrophils SPECIMEN CLOTTED %   Lymphocytes Relative SPECIMEN CLOTTED %    Comment: CORRECTED ON 05/06 AT 1884: PREVIOUSLY REPORTED AS 10   Lymphs Abs SPECIMEN CLOTTED 0.7 - 4.0 K/uL    Comment: CORRECTED ON 05/06 AT 1660: PREVIOUSLY REPORTED AS 0.9   Monocytes Relative SPECIMEN CLOTTED %    Comment: CORRECTED ON 05/06 AT 6301: PREVIOUSLY REPORTED AS 5   Monocytes Absolute SPECIMEN CLOTTED 0.1 - 1.0 K/uL    Comment: CORRECTED ON 05/06 AT 6010: PREVIOUSLY REPORTED AS 0.4   Eosinophils Relative SPECIMEN CLOTTED %    Comment: CORRECTED ON 05/06 AT 9323: PREVIOUSLY REPORTED AS 2   Eosinophils Absolute  SPECIMEN CLOTTED 0.0 - 0.7 K/uL    Comment: CORRECTED ON 05/06 AT 5573: PREVIOUSLY REPORTED AS 0.2   Basophils Relative SPECIMEN CLOTTED %    Comment: CORRECTED ON 05/06 AT 2202: PREVIOUSLY REPORTED AS 0   Basophils Absolute SPECIMEN CLOTTED 0.0 - 0.1 K/uL    Comment: CORRECTED ON 05/06 AT 5427: PREVIOUSLY REPORTED AS 0.0   WBC Morphology SPECIMEN CLOTTED    RBC Morphology SPECIMEN CLOTTED    Smear Review SPECIMEN CLOTTED    Other SPECIMEN CLOTTED %   nRBC SPECIMEN CLOTTED 0 /100 WBC   Metamyelocytes Relative SPECIMEN CLOTTED %   Myelocytes SPECIMEN CLOTTED %   Promyelocytes Relative SPECIMEN CLOTTED %   Blasts SPECIMEN CLOTTED %    Comment: Performed at [facility], [place] [address] [place], [place] [address]  I-stat chem 8, ed     Status: Abnormal   Collection Time: 11/16/17  7:57 AM  Result Value Ref Range   Sodium 140 135 - 145 mmol/L   Potassium 3.1 (L) 3.5 - 5.1 mmol/L   Chloride 89 (L) 101 - 111 mmol/L   BUN 9 6 - 20 mg/dL   Creatinine, Ser 0.80 0.61 - 1.24 mg/dL   Glucose, Bld 104 (H) 65 - 99 mg/dL   Calcium, Ion 1.08 (L) 1.15 - 1.40 mmol/L   TCO2 44 (H) 22 - 32 mmol/L   Hemoglobin 15.0 13.0 - 17.0 g/dL   HCT 44.0 39.0 - 52.0 %  I-stat troponin, ED     Status: Abnormal   Collection Time: 11/16/17  8:18 AM  Result Value Ref Range   Troponin i, poc 0.29 (HH) 0.00 - 0.08 ng/mL   Comment NOTIFIED PHYSICIAN    Comment 3            Comment: Due to the release kinetics of cTnI, a negative result within the first hours of the onset of symptoms  does not rule out myocardial infarction with certainty. If myocardial infarction is still suspected, repeat the test at appropriate intervals.   Brain natriuretic peptide     Status: Abnormal   Collection Time: 11/16/17  8:32 AM  Result Value Ref Range   B Natriuretic Peptide 275.9 (H) 0.0 - 100.0 pg/mL    Comment: Performed at [facility], [place] [address] [place], [place] [address]  CBC with  Differential/Platelet     Status: Abnormal   Collection Time: 11/16/17  8:32 AM  Result Value Ref Range   WBC 9.4 4.0 - 10.5 K/uL   RBC 4.12 (L) 4.22 - 5.81 MIL/uL   Hemoglobin 12.1 (L) 13.0 - 17.0 g/dL   HCT 39.5 39.0 - 52.0 %   MCV 95.9 78.0 - 100.0 fL   MCH 29.4 26.0 - 34.0 pg   MCHC 30.6 30.0 - 36.0 g/dL   RDW 14.2 11.5 - 15.5 %   Platelets 200 150 - 400 K/uL   Neutrophils Relative % 84 %   Neutro Abs 7.8 (H) 1.7 - 7.7 K/uL   Lymphocytes Relative 11 %   Lymphs Abs 1.1 0.7 - 4.0 K/uL   Monocytes Relative 4 %   Monocytes Absolute 0.3 0.1 - 1.0 K/uL   Eosinophils Relative 1 %   Eosinophils Absolute 0.1 0.0 - 0.7 K/uL   Basophils Relative 0 %   Basophils Absolute 0.0 0.0 - 0.1 K/uL    Comment: Performed at [facility], [place] [address], [place], [place] [address]  Procalcitonin - Baseline     Status: None   Collection Time: 11/16/17  8:32 AM  Result Value Ref Range   Procalcitonin 0.12 ng/mL    Comment:        Interpretation: PCT (Procalcitonin) <= 0.5 ng/mL: Systemic infection (sepsis) is not likely. Local bacterial infection is possible. (NOTE)       Sepsis PCT Algorithm           Lower Respiratory Tract                                      Infection PCT Algorithm    ----------------------------     ----------------------------         PCT < 0.25 ng/mL                PCT < 0.10 ng/mL         Strongly encourage             Strongly discourage   discontinuation of antibiotics    initiation of antibiotics    ----------------------------     -----------------------------       PCT 0.25 - 0.50 ng/mL            PCT 0.10 - 0.25 ng/mL               OR       >80% decrease in PCT            Discourage initiation of                                            antibiotics      Encourage discontinuation           of antibiotics    ----------------------------     -----------------------------  PCT >= 0.50 ng/mL              PCT 0.26 - 0.50 ng/mL                AND        <80% decrease in PCT             Encourage initiation of                                             antibiotics       Encourage continuation           of antibiotics    ----------------------------     -----------------------------        PCT >= 0.50 ng/mL                  PCT > 0.50 ng/mL               AND         increase in PCT                  Strongly encourage                                      initiation of antibiotics    Strongly encourage escalation           of antibiotics                                     -----------------------------                                           PCT <= 0.25 ng/mL                                                 OR                                        > 80% decrease in PCT                                     Discontinue / Do not initiate                                             antibiotics Performed at [place] [address], [place], [place] [address]   Blood gas, arterial [facility] & [facility] ONLY)     Status: Abnormal   Collection Time: 11/16/17  9:16 AM  Result Value Ref Range   FIO2 50.00    Delivery systems BILEVEL POSITIVE AIRWAY PRESSURE    Peep/cpap 5.0 cm H20   Pressure control  15.0 cm H20   pH, Arterial 7.328 (L) 7.350 - 7.450   pCO2 arterial 87.3 (HH) 32.0 - 48.0 mmHg    Comment: CRITICAL RESULT CALLED TO, READ BACK BY AND VERIFIED WITH: ABROL, MD AT [phone] BY M.JESTER, RRT, RCP ON 11/16/17    pO2, Arterial 89.6 83.0 - 108.0 mmHg   Bicarbonate 44.5 (H) 20.0 - 28.0 mmol/L   Acid-Base Excess 14.7 (H) 0.0 - 2.0 mmol/L   O2 Saturation 96.3 %   Patient temperature 98.6    Collection site LEFT RADIAL    Drawn by [id number]    Sample type ARTERIAL DRAW    Allens test (pass/fail) PASS PASS    Comment: Performed at [facility], [place] [address] [place], [place] [address]  Blood gas, arterial     Status: Abnormal   Collection Time: 11/16/17 11:21 AM  Result Value Ref Range   FIO2  50.00    Delivery systems BILEVEL POSITIVE AIRWAY PRESSURE    Peep/cpap 5.0 cm H20   Pressure control 15.0 cm H20   pH, Arterial 7.356 7.350 - 7.450   pCO2 arterial 78.8 (HH) 32.0 - 48.0 mmHg    Comment: CRITICAL RESULT CALLED TO, READ BACK BY AND VERIFIED WITH: N.ABROL, MD AT 1127 BY M.JESTER, RRT, RCP ON 11/16/17    pO2, Arterial 80.4 (L) 83.0 - 108.0 mmHg   Bicarbonate 43.0 (H) 20.0 - 28.0 mmol/L   Acid-Base Excess 13.8 (H) 0.0 - 2.0 mmol/L   O2 Saturation 95.8 %   Patient temperature 98.6    Collection site LEFT BRACHIAL    Drawn by [id number]    Allens test (pass/fail) PASS PASS    Comment: Performed at [facility], [place] [address] , [place] [address]  Blood gas, arterial     Status: Abnormal   Collection Time: 11/16/17  8:10 PM  Result Value Ref Range   O2 Content 3.0 L/min   Delivery systems NASAL CANNULA    pH, Arterial 7.429 7.350 - 7.450   pCO2 arterial 67.3 (HH) 32.0 - 48.0 mmHg    Comment: CRITICAL RESULT CALLED TO, READ BACK BY AND VERIFIED WITH: Alberteen Sam, MD AT 2017 ON 11/16/2017 BY DANIEL JONES, RRT, RCP     pO2, Arterial 70.7 (L) 83.0 - 108.0 mmHg   Bicarbonate 44.1 (H) 20.0 - 28.0 mmol/L   Acid-Base Excess 16.0 (H) 0.0 - 2.0 mmol/L   O2 Saturation 94.9 %   Patient temperature 97.6    Collection site LEFT RADIAL    Drawn by [id number]    Sample type ARTERIAL DRAW    Allens test (pass/fail) PASS PASS    Comment: Performed at [place] [address] [place], [place] [address]   Dg Chest Portable 1 View  Result Date: 11/16/2017 CLINICAL DATA:  Dyspnea, increase shortness of breath this morning. History of coronary artery disease, emphysema, former smoker. Obesity. EXAM: PORTABLE CHEST 1 VIEW COMPARISON:  Portable chest x-ray of January 22, 2017 FINDINGS: The lungs are mildly hyperinflated. The interstitial markings are coarse especially the bases but are stable. There is no alveolar infiltrate or pleural effusion. The  cardiac silhouette is enlarged. The central pulmonary vascularity is engorged and more prominent today. The mediastinum exhibits mild widening but this is stable. IMPRESSION: Findings consistent with low-grade CHF superimposed upon COPD. No alveolar pneumonia. Electronically Signed   By: David  Martinique M.D.   On: 11/16/2017 07:31    Pending Labs Unresulted Labs (From admission, onward)  Start     Ordered   11/17/17 0940  Brain natriuretic peptide  Once,   R     11/16/17 0940   11/17/17 0500  CBC  Tomorrow morning,   R     11/16/17 1914   11/17/17 0500  Comprehensive metabolic panel  Tomorrow morning,   R     11/16/17 0817   11/16/17 1915  Magnesium  Add-on,   R     11/16/17 1914   11/16/17 0649  CBC with Differential/Platelet  Once,   R     11/16/17 0649      Vitals/Pain Today's Vitals   11/16/17 2140 11/16/17 2200 11/16/17 2243 11/17/17 0141  BP: (!) 177/82 (!) 179/90  (!) 187/94  Pulse: 73 72  67  Resp: 18   16  Temp:   98.2 F (36.8 C)   TempSrc:      SpO2: 94% 96%  95%  Weight:      Height:        Isolation Precautions No active isolations  Medications Medications  potassium chloride SA (K-DUR,KLOR-CON) CR tablet 40 mEq (0 mEq Oral Hold 11/16/17 0909)  allopurinol (ZYLOPRIM) tablet 300 mg (300 mg Oral Given 11/16/17 2138)  apixaban (ELIQUIS) tablet 5 mg (5 mg Oral Given 11/16/17 2138)  mometasone-formoterol (DULERA) 200-5 MCG/ACT inhaler 2 puff (0 puffs Inhalation Hold 11/16/17 2033)  citalopram (CELEXA) tablet 20 mg (20 mg Oral Given 11/16/17 2221)  diltiazem (CARDIZEM) tablet 30 mg (30 mg Oral Given 11/16/17 2147)  oxyCODONE (Oxy IR/ROXICODONE) immediate release tablet 5 mg (has no administration in time range)  pantoprazole (PROTONIX) EC tablet 40 mg (has no administration in time range)  tamsulosin (FLOMAX) capsule 0.4 mg (0.4 mg Oral Given 11/16/17 2138)  traMADol (ULTRAM) tablet 50 mg (has no administration in time range)  sodium chloride flush (NS) 0.9 % injection 3 mL  (has no administration in time range)  0.9 %  sodium chloride infusion (has no administration in time range)  acetaminophen (TYLENOL) tablet 650 mg (has no administration in time range)    Or  acetaminophen (TYLENOL) suppository 650 mg (has no administration in time range)  ondansetron (ZOFRAN) tablet 4 mg (has no administration in time range)    Or  ondansetron (ZOFRAN) injection 4 mg (has no administration in time range)  levalbuterol (XOPENEX) nebulizer solution 0.63 mg (has no administration in time range)  methylPREDNISolone sodium succinate (SOLU-MEDROL) 125 mg/2 mL injection 80 mg (80 mg Intravenous Given 11/16/17 2146)  furosemide (LASIX) injection 40 mg (40 mg Intravenous Given 11/16/17 1740)  potassium chloride SA (K-DUR,KLOR-CON) CR tablet 40 mEq (40 mEq Oral Given 11/16/17 1739)  bisoprolol (ZEBETA) tablet 5 mg (5 mg Oral Given 11/16/17 1740)  ceFAZolin (ANCEF) IVPB 1 g/50 mL premix (0 g Intravenous Stopped 11/16/17 2149)  nitroGLYCERIN (NITROGLYN) 2 % ointment 0.5 inch (0.5 inches Topical Given 11/16/17 0726)  albuterol (PROVENTIL,VENTOLIN) solution continuous neb (10 mg/hr Nebulization Given 11/16/17 0727)  methylPREDNISolone sodium succinate (SOLU-MEDROL) 125 mg/2 mL injection 125 mg (125 mg Intravenous Given 11/16/17 0756)  furosemide (LASIX) injection 40 mg (40 mg Intravenous Given 11/16/17 0754)  naloxone (NARCAN) injection 0.4 mg (0.4 mg Intravenous Given 11/16/17 0753)    Mobility walks

## 2017-11-17 NOTE — Progress Notes (Signed)
  Echocardiogram 2D Echocardiogram has been performed.  Scott Rollins F 11/17/2017, 12:27 PM

## 2017-11-17 NOTE — Progress Notes (Signed)
PROGRESS NOTE [facility]   Scott Rollins   SJG:[id number] DOB: [birth year]  DOA: 11/16/2017 PCP: Scott Sou, MD   Brief Narrative:  Scott Rollins 67 year old male with medical history of metastatic prostate cancer, COPD, OSA on CPAP at home, hypertension, A. fib on Eliquis, morbid obesity and chronic pain syndrome presented to the emergency department complaining of shortness of breath.  He was brought by EMS with noted the patient was in respiratory distress with wheezing and leg swelling.  Upon ED evaluation patient was found to be hypoxic with moderate to severe respiratory distress.  Chest x-ray consistent with congestive heart failure.  ABG was done with 7.29/94/84 subsequently patient was started on BiPAP, given IV Lasix and nitroglycerin ointment.  Patient was admitted with working diagnosis of acute respiratory failure with hypoxia due to combination of COPD and CHF exacerbation.  Subjective: Patient seen and examined, breathing improving, but still SOB, denies chest pain and SOB. No acute events overnight off BiPAP. Afebrile.    Assessment & Plan: Acute hypercapnic hypoxic respiratory failure Multifactorial in combination with acute COPD exacerbation and congestive heart failure Patient initially treated with BiPAP, subsequently weaned off to oxygen supplementation via nasal cannula Treat underlying causes  Acute on chronic diastolic heart failure Echocardiogram shows LVEF of 55 to 65%, grade 1 diastolic dysfunction no wall motion abnormalities. Patient with good diuresis, negative for about 3.6 L during hospital stay Continue IV Lasix, monitor daily weights and strict I&O.  Low-salt diet  COPD exacerbation Continue IV Solu-Medrol, continue nebulizer every 6 hours Wean oxygen as able Check pulse ox with ambulation prior to discharge  A. fib Currently rate well controlled and sinus rhythm Continue Cardizem and Eliquis  Hypertension Patient on  bisoprolol and HCTZ at home.  HCTZ on hold due to patient being on Lasix. BP slightly elevated, continue to monitor for now, expect to improve with diuresis.  Her BP closely  Right lower extremity cellulitis Very mild, continue cefazolin for now but low threshold to discontinue.   OSA Continue CPAP  Chronic pain syndrome Patient received Narcan on arrival with improvement of his mental status Reconcile narcotics  Prostate cancer Patient is followed by Scott Rollins  DVT prophylaxis: Eliquis Code Status: Full code Family Communication: None at bedside Disposition Plan: Home in 2 to 3 days after aggressive diuresis   Consultants:   None  Procedures:   Echocardiogram  Antimicrobials:  Cefazolin 5/6   Objective: Vitals:   11/17/17 0410 11/17/17 0502 11/17/17 0600 11/17/17 0811  BP: (!) 215/112 (!) 183/92 (!) 179/81   Pulse: 72 74 82   Resp: 20 18 16    Temp:      TempSrc:      SpO2: 98% 96% 94% 95%  Weight:      Height:        Intake/Output Summary (Last 24 hours) at 11/17/2017 0834 Last data filed at 11/17/2017 0600 Gross per 24 hour  Intake 150 ml  Output 3650 ml  Net -3500 ml   Filed Weights   11/16/17 0656 11/17/17 0343  Weight: 124.3 kg (274 lb) 125.4 kg (276 lb 7.3 oz)    Examination:  General exam: Appears calm and comfortable  HEENT: OP moist and clear Respiratory system: Decreased breath sounds bilaterally, bibasilar crackles, mild expiratory wheezes Cardiovascular system: S1-S2, regular rate and rhythm, no murmurs Gastrointestinal system: Abdomen is nondistended, soft and nontender.  Central nervous system: Alert and oriented. Extremities: Bilateral lower extremity edema 2+ Skin: Chronic venous stasis changes  on right lower extremity with mild erythema Psychiatry: Mood & affect appropriate.    Data Reviewed: I have personally reviewed following labs and imaging studies  CBC: Recent Labs  Lab 11/16/17 0736 11/16/17 0757 11/16/17 0832  11/17/17 0334  WBC SPECIMEN CLOTTED  --  9.4 8.5  NEUTROABS SPECIMEN CLOTTED  --  7.8*  --   HGB SPECIMEN CLOTTED 15.0 12.1* 13.3  HCT SPECIMEN CLOTTED 44.0 39.5 44.2  MCV SPECIMEN CLOTTED  --  95.9 92.9  PLT SPECIMEN CLOTTED  --  200 884   Basic Metabolic Panel: Recent Labs  Lab 11/16/17 0757 11/17/17 0334  NA 140 140  K 3.1* 3.4*  CL 89* 87*  CO2  --  39*  GLUCOSE 104* 152*  BUN 9 20  CREATININE 0.80 0.79  CALCIUM  --  9.1  MG  --  1.6*   GFR: Estimated Creatinine Clearance: 117.5 mL/min (by C-G formula based on SCr of 0.79 mg/dL). Liver Function Tests: Recent Labs  Lab 11/17/17 0334  AST 24  ALT 13*  ALKPHOS 68  BILITOT 0.7  PROT 7.0  ALBUMIN 3.6   No results for input(s): LIPASE, AMYLASE in the last 168 hours. No results for input(s): AMMONIA in the last 168 hours. Coagulation Profile: No results for input(s): INR, PROTIME in the last 168 hours. Cardiac Enzymes: No results for input(s): CKTOTAL, CKMB, CKMBINDEX, TROPONINI in the last 168 hours. BNP (last 3 results) No results for input(s): PROBNP in the last 8760 hours. HbA1C: No results for input(s): HGBA1C in the last 72 hours. CBG: No results for input(s): GLUCAP in the last 168 hours. Lipid Profile: No results for input(s): CHOL, HDL, LDLCALC, TRIG, CHOLHDL, LDLDIRECT in the last 72 hours. Thyroid Function Tests: No results for input(s): TSH, T4TOTAL, FREET4, T3FREE, THYROIDAB in the last 72 hours. Anemia Panel: No results for input(s): VITAMINB12, FOLATE, FERRITIN, TIBC, IRON, RETICCTPCT in the last 72 hours. Sepsis Labs: Recent Labs  Lab 11/16/17 0832  PROCALCITON 0.12    Recent Results (from the past 240 hour(s))  MRSA PCR Screening     Status: None   Collection Time: 11/17/17  2:50 AM  Result Value Ref Range Status   MRSA by PCR NEGATIVE NEGATIVE Final    Comment:        The GeneXpert MRSA Assay (FDA approved for NASAL specimens only), is one component of a comprehensive MRSA  colonization surveillance program. It is not intended to diagnose MRSA infection nor to guide or monitor treatment for MRSA infections. Performed at [facility], [place] [address] [place], [place] [address]     Radiology Studies: Dg Chest Portable 1 View  Result Date: 11/16/2017 CLINICAL DATA:  Dyspnea, increase shortness of breath this morning. History of coronary artery disease, emphysema, former smoker. Obesity. EXAM: PORTABLE CHEST 1 VIEW COMPARISON:  Portable chest x-ray of January 22, 2017 FINDINGS: The lungs are mildly hyperinflated. The interstitial markings are coarse especially the bases but are stable. There is no alveolar infiltrate or pleural effusion. The cardiac silhouette is enlarged. The central pulmonary vascularity is engorged and more prominent today. The mediastinum exhibits mild widening but this is stable. IMPRESSION: Findings consistent with low-grade CHF superimposed upon COPD. No alveolar pneumonia. Electronically Signed   By: David  Martinique M.D.   On: 11/16/2017 07:31    Scheduled Meds: . allopurinol  300 mg Oral Daily  . apixaban  5 mg Oral BID  . bisoprolol  5 mg Oral Daily  . citalopram  20 mg Oral QHS  . diltiazem  30 mg Oral Q12H  . furosemide  40 mg Intravenous Q12H  . methylPREDNISolone (SOLU-MEDROL) injection  80 mg Intravenous Q12H  . mometasone-formoterol  2 puff Inhalation BID  . pantoprazole  40 mg Oral Daily  . potassium chloride SA  40 mEq Oral Once  . potassium chloride  40 mEq Oral Daily  . sodium chloride flush  3 mL Intravenous Q12H  . tamsulosin  0.4 mg Oral QPC supper   Continuous Infusions: . sodium chloride    .  ceFAZolin (ANCEF) IV Stopped (11/17/17 0413)     LOS: 1 day    Time spent: Total of 35 minutes spent with pt, greater than 50% of which was spent in discussion of  treatment, counseling and coordination of care   Scott Oman, MD Pager: Text Page via [email or web address]   If 7PM-7AM, please contact  night-coverage [email or web address] 11/17/2017, 8:34 AM   Note - This record has been created using [name]. Chart creation errors have been sought, but may not always have been located. Such creation errors do not reflect on the standard of medical care.

## 2017-11-18 ENCOUNTER — Ambulatory Visit: Payer: Medicare Other | Admitting: Pharmacist

## 2017-11-18 DIAGNOSIS — I5033 Acute on chronic diastolic (congestive) heart failure: Secondary | ICD-10-CM

## 2017-11-18 DIAGNOSIS — I480 Paroxysmal atrial fibrillation: Secondary | ICD-10-CM

## 2017-11-18 LAB — GLUCOSE, CAPILLARY
GLUCOSE-CAPILLARY: 155 mg/dL — AB (ref 65–99)
Glucose-Capillary: 167 mg/dL — ABNORMAL HIGH (ref 65–99)
Glucose-Capillary: 177 mg/dL — ABNORMAL HIGH (ref 65–99)
Glucose-Capillary: 125 mg/dL — ABNORMAL HIGH (ref 65–99)

## 2017-11-18 MED ORDER — ABIRATERONE ACETATE 250 MG PO TABS
1000.0000 mg | ORAL_TABLET | ORAL | Status: DC
  Administered 2017-11-18 – 2017-11-19 (×2): 1000 mg via ORAL

## 2017-11-18 MED ORDER — METHYLPREDNISOLONE SODIUM SUCC 40 MG IJ SOLR
40.0000 mg | Freq: Two times a day (BID) | INTRAMUSCULAR | Status: DC
Start: 2017-11-18 — End: 2017-11-19
  Administered 2017-11-18 – 2017-11-19 (×2): 40 mg via INTRAVENOUS
  Filled 2017-11-18 (×2): qty 1

## 2017-11-18 MED ORDER — POTASSIUM CHLORIDE CRYS ER 20 MEQ PO TBCR
40.0000 meq | EXTENDED_RELEASE_TABLET | ORAL | Status: AC
  Administered 2017-11-18 (×2): 40 meq via ORAL
  Filled 2017-11-18 (×2): qty 2

## 2017-11-18 MED ORDER — CEPHALEXIN 500 MG PO CAPS
500.0000 mg | ORAL_CAPSULE | Freq: Three times a day (TID) | ORAL | Status: DC
  Administered 2017-11-18 – 2017-11-19 (×4): 500 mg via ORAL
  Filled 2017-11-18 (×4): qty 1

## 2017-11-18 MED ORDER — MAGNESIUM SULFATE 2 GM/50ML IV SOLN
2.0000 g | Freq: Once | INTRAVENOUS | Status: AC
  Administered 2017-11-18: 2 g via INTRAVENOUS
  Filled 2017-11-18: qty 50

## 2017-11-18 NOTE — Progress Notes (Signed)
Patient ID: Scott Rollins, male   DOB: 11-Jan-1951, 67 y.o.   MRN: [id number]  PROGRESS NOTE    Scott Rollins  QVZ:[id number] DOB: 04/17/1951 DOA: 11/16/2017 PCP: [name], MD   Brief Narrative:  67 year old male with history of metastatic prostate cancer, COPD, OSA on CPAP at home, hypertension, A. fib on Eliquis, obesity and chronic pain syndrome presented 619 with worsening shortness of breath.  He initially required BiPAP and was started on intravenous Lasix and admitted with acute hypoxic respiratory failure from COPD and CHF exacerbation.   Assessment & Plan:   Principal Problem:   Acute hypercapnic respiratory failure (HCC) Active Problems:   OBESITY   Essential hypertension   Coronary artery disease due to lipid rich plaque   EMPHYSEMA   Prostate cancer (HCC)   Chronic gout   Basilar migraine   COPD with acute exacerbation (HCC)   Atrial fibrillation (HCC)   Acute on chronic respiratory failure (HCC)   Obesity hypoventilation syndrome (HCC)   Acute exacerbation of chronic obstructive pulmonary disease (COPD) (HCC)   Acute hypercapnic hypoxic respiratory failure -Due to CHF and COPD exacerbation.  Initially required BiPAP -Improving.  Currently on oxygen via nasal cannula at 2 L/min.  Continue weaning as able -Spirometry  Acute on chronic diastolic heart failure -Echocardiogram shows LVEF of 55 to 32%, grade 1 diastolic dysfunction no wall motion abnormalities. -Strict input and output.  Daily weights.  Negative balance of 4722 cc since admission.   -Continue intravenous Lasix today and probable switch to oral Lasix in 1 to 2 days.  COPD exacerbation -Improving.  Decrease Solu-Medrol to 40 milligrams IV every 12 hours.  -Continue nebs.  Continue Pulmicort  Hypokalemia -Replace.  Repeat a.m. labs  Hypomagnesemia -Replace.  Repeat a.m. labs  A. fib -Currently rate well controlled  -Continue Cardizem and Eliquis  Hypertension -Continue  Lasix, bisoprolol and Cardizem.  Monitor blood pressure  Right lower extremity cellulitis -Very mild -Improving.  Switch Ancef to Keflex.   OSA -Continue CPAP  Chronic pain syndrome -Outpatient follow-up.  Prostate cancer -Outpatient follow-up with Dr. Alen Blew  DVT prophylaxis: Eliquis Code Status: Full code Family Communication: None at bedside Disposition Plan: Home in 1 to 3 days    Consultants:   None  Procedures:   Echocardiogram Study Conclusions  - Left ventricle: The cavity size was normal. Wall thickness was   increased in a pattern of mild LVH. Systolic function was normal.   The estimated ejection fraction was in the range of 55% to 60%.   Wall motion was normal; there were no regional wall motion   abnormalities. Doppler parameters are consistent with abnormal   left ventricular relaxation (grade 1 diastolic dysfunction). - Mitral valve: Calcified annulus. Mildly thickened leaflets . - Left atrium: The atrium was mildly dilated. - Right ventricle: The cavity size was mildly dilated. - Right atrium: The atrium was mildly dilated.  Impressions:  - Normal LV systolic function; mild diastolic dysfunction; mild   LVH; mild LAE, RAE and RVE.   Antimicrobials:  Cefazolin 5/6   Subjective: Patient seen and examined at bedside.  He feels better but still feels weak and short of breath.  No overnight fever, nausea, vomiting or chest pain.  Objective: Vitals:   11/17/17 2118 11/18/17 0516 11/18/17 0825 11/18/17 0828  BP:  (!) 183/94    Pulse:  68    Resp:  18    Temp:  98.2 F (36.8 C)    TempSrc:  Oral  SpO2: 96% 93% 93% 93%  Weight:      Height:        Intake/Output Summary (Last 24 hours) at 11/18/2017 1008 Last data filed at 11/18/2017 0800 Gross per 24 hour  Intake 653 ml  Output 875 ml  Net -222 ml   Filed Weights   11/16/17 0656 11/17/17 0343  Weight: 124.3 kg (274 lb) 125.4 kg (276 lb 7.3 oz)    Examination:  General  exam: Appears calm and comfortable  Respiratory system: Bilateral decreased breath sounds at bases with some scattered crackles Cardiovascular system: S1 & S2 heard, rate controlled  gastrointestinal system: Abdomen is nondistended, soft and nontender. Normal bowel sounds heard. Central nervous system: Alert and oriented. No focal neurological deficits. Moving extremities Extremities: No cyanosis, clubbing; 1-2+ edema Skin: Chronic venous stasis changes on right lower extremity with very minimal erythema Psychiatry: Judgement and insight appear normal. Mood & affect appropriate.     Data Reviewed: I have personally reviewed following labs and imaging studies  CBC: Recent Labs  Lab 11/16/17 0736 11/16/17 0757 11/16/17 0832 11/17/17 0334  WBC SPECIMEN CLOTTED  --  9.4 8.5  NEUTROABS SPECIMEN CLOTTED  --  7.8*  --   HGB SPECIMEN CLOTTED 15.0 12.1* 13.3  HCT SPECIMEN CLOTTED 44.0 39.5 44.2  MCV SPECIMEN CLOTTED  --  95.9 92.9  PLT SPECIMEN CLOTTED  --  200 423   Basic Metabolic Panel: Recent Labs  Lab 11/16/17 0757 11/17/17 0334  NA 140 140  K 3.1* 3.4*  CL 89* 87*  CO2  --  39*  GLUCOSE 104* 152*  BUN 9 20  CREATININE 0.80 0.79  CALCIUM  --  9.1  MG  --  1.6*   GFR: Estimated Creatinine Clearance: 117.5 mL/min (by C-G formula based on SCr of 0.79 mg/dL). Liver Function Tests: Recent Labs  Lab 11/17/17 0334  AST 24  ALT 13*  ALKPHOS 68  BILITOT 0.7  PROT 7.0  ALBUMIN 3.6   No results for input(s): LIPASE, AMYLASE in the last 168 hours. No results for input(s): AMMONIA in the last 168 hours. Coagulation Profile: No results for input(s): INR, PROTIME in the last 168 hours. Cardiac Enzymes: No results for input(s): CKTOTAL, CKMB, CKMBINDEX, TROPONINI in the last 168 hours. BNP (last 3 results) No results for input(s): PROBNP in the last 8760 hours. HbA1C: No results for input(s): HGBA1C in the last 72 hours. CBG: Recent Labs  Lab 11/17/17 0734  11/17/17 1135 11/18/17 0641 11/18/17 0741  GLUCAP 199* 201* 155* 167*   Lipid Profile: No results for input(s): CHOL, HDL, LDLCALC, TRIG, CHOLHDL, LDLDIRECT in the last 72 hours. Thyroid Function Tests: No results for input(s): TSH, T4TOTAL, FREET4, T3FREE, THYROIDAB in the last 72 hours. Anemia Panel: No results for input(s): VITAMINB12, FOLATE, FERRITIN, TIBC, IRON, RETICCTPCT in the last 72 hours. Sepsis Labs: Recent Labs  Lab 11/16/17 0832  PROCALCITON 0.12    Recent Results (from the past 240 hour(s))  MRSA PCR Screening     Status: None   Collection Time: 11/17/17  2:50 AM  Result Value Ref Range Status   MRSA by PCR NEGATIVE NEGATIVE Final    Comment:        The GeneXpert MRSA Assay (FDA approved for NASAL specimens only), is one component of a comprehensive MRSA colonization surveillance program. It is not intended to diagnose MRSA infection nor to guide or monitor treatment for MRSA infections. Performed at [facility], [place] [address] [place],  [place] [address]          Radiology Studies: No results found.      Scheduled Meds: . abiraterone acetate  1,000 mg Oral Q24H  . allopurinol  300 mg Oral Daily  . apixaban  5 mg Oral BID  . arformoterol  15 mcg Nebulization BID  . bisoprolol  5 mg Oral Daily  . budesonide (PULMICORT) nebulizer solution  0.5 mg Nebulization BID  . cephALEXin  500 mg Oral Q8H  . citalopram  20 mg Oral QHS  . diltiazem  30 mg Oral Q12H  . furosemide  40 mg Intravenous Q12H  . methylPREDNISolone (SOLU-MEDROL) injection  80 mg Intravenous Q12H  . pantoprazole  40 mg Oral Daily  . potassium chloride  40 mEq Oral Q4H  . sodium chloride flush  3 mL Intravenous Q12H  . tamsulosin  0.4 mg Oral QPC supper   Continuous Infusions: . sodium chloride    . magnesium sulfate 1 - 4 g bolus IVPB 2 g (11/18/17 0947)     LOS: 2 days        Aline August, MD [facility] Pager [phone]  If  7PM-7AM, please contact night-coverage [email or web address] Password [email or web address] 11/18/2017, 10:08 AM

## 2017-11-19 DIAGNOSIS — J810 Acute pulmonary edema: Secondary | ICD-10-CM

## 2017-11-19 LAB — BASIC METABOLIC PANEL
Anion gap: 14 (ref 5–15)
BUN: 36 mg/dL — AB (ref 6–20)
CHLORIDE: 86 mmol/L — AB (ref 101–111)
CO2: 38 mmol/L — AB (ref 22–32)
CREATININE: 0.83 mg/dL (ref 0.61–1.24)
Calcium: 8.8 mg/dL — ABNORMAL LOW (ref 8.9–10.3)
GFR calc Af Amer: >60 mL/min (ref >60)
GFR calc non Af Amer: >60 mL/min (ref >60)
GLUCOSE: 149 mg/dL — AB (ref 65–99)
Potassium: 3.7 mmol/L (ref 3.5–5.1)
Sodium: 138 mmol/L (ref 135–145)

## 2017-11-19 LAB — MAGNESIUM: Magnesium: 2.1 mg/dL (ref 1.7–2.4)

## 2017-11-19 LAB — GLUCOSE, CAPILLARY
Glucose-Capillary: 129 mg/dL — ABNORMAL HIGH (ref 65–99)
Glucose-Capillary: 163 mg/dL — ABNORMAL HIGH (ref 65–99)

## 2017-11-19 MED ORDER — ZOLPIDEM TARTRATE 5 MG PO TABS
5.0000 mg | ORAL_TABLET | Freq: Once | ORAL | Status: AC
  Administered 2017-11-19: 5 mg via ORAL
  Filled 2017-11-19: qty 1

## 2017-11-19 MED ORDER — PREDNISONE 20 MG PO TABS: 20.0000 mg | ORAL_TABLET | Freq: Every day | ORAL | 0 refills | Status: AC

## 2017-11-19 MED ORDER — PREDNISONE 5 MG PO TABS: 5.0000 mg | ORAL_TABLET | Freq: Every day | ORAL | Status: DC

## 2017-11-19 MED ORDER — FUROSEMIDE 40 MG PO TABS: 40.0000 mg | ORAL_TABLET | Freq: Two times a day (BID) | ORAL | 0 refills | Status: DC

## 2017-11-19 MED ORDER — PREDNISONE 5 MG PO TABS: ORAL_TABLET | ORAL | Status: DC

## 2017-11-19 MED ORDER — GABAPENTIN 300 MG PO CAPS: 300.0000 mg | ORAL_CAPSULE | Freq: Two times a day (BID) | ORAL | Status: DC

## 2017-11-19 MED ORDER — CITALOPRAM HYDROBROMIDE 40 MG PO TABS: 40.0000 mg | ORAL_TABLET | Freq: Every day | ORAL | Status: AC

## 2017-11-19 MED ORDER — BISOPROLOL FUMARATE 5 MG PO TABS: 5.0000 mg | ORAL_TABLET | Freq: Every day | ORAL | 0 refills | Status: DC

## 2017-11-19 NOTE — Care Management Important Message (Signed)
Important Message  Patient Details  Name: AKIL HOOS MRN: [id number] Date of Birth: 1951/01/05   Medicare Important Message Given:  Yes    Kerin Salen 11/19/2017, 1:11 [place] Message  Patient Details  Name: VALERIA KRISKO MRN: [id number] Date of Birth: May 19, 1951   Medicare Important Message Given:  Yes    Kerin Salen 11/19/2017, 1:11 PM

## 2017-11-19 NOTE — Evaluation (Signed)
Physical Therapy Evaluation Patient Details Name: Scott Rollins MRN: [id number] DOB: July 03, 1951 Today's Date: 11/19/2017   History of Present Illness  Pt admitted with resp failure 2* exacerbation of COPD and CHF.  Pt with hx of Chronic pain, depression, MVP and MI and is currently undergoing chemo for "bone CA"  Clinical Impression  Pt admitted as above and presenting with functional mobility limitations 2* generalized weakness and mild ambulatory balance deficits.  Pt would benefit from use of 4 wh RW at home to assist with ADL and balance.      Follow Up Recommendations No PT follow up(Pt declines follow up HHPT)    Equipment Recommendations  Other (comment)(Pt would benefit from use of rollator at home)    Recommendations for Other Services       Precautions / Restrictions Precautions Precautions: Fall Restrictions Weight Bearing Restrictions: No      Mobility  Bed Mobility Overal bed mobility: Modified Independent                Transfers Overall transfer level: Needs assistance   Transfers: Sit to/from Stand Sit to Stand: Supervision         General transfer comment: min cues for safety  Ambulation/Gait Ambulation/Gait assistance: Min guard;Supervision Ambulation Distance (Feet): 120 Feet Assistive device: 4-wheeled walker Gait Pattern/deviations: Step-through pattern;Decreased step length - right;Decreased step length - left;Shuffle;Trunk flexed Gait velocity: decr   General Gait Details: cues for pace, posture and position from rollator  Stairs            Wheelchair Mobility    Modified Rankin (Stroke Patients Only)       Balance Overall balance assessment: Needs assistance Sitting-balance support: No upper extremity supported;Feet supported Sitting balance-Leahy Scale: Good     Standing balance support: No upper extremity supported Standing balance-Leahy Scale: Fair                               Pertinent  Vitals/Pain      Home Living Family/patient expects to be discharged to:: Private residence Living Arrangements: Spouse/significant other Available Help at Discharge: Family;Available 24 hours/day Type of Home: House Home Access: Stairs to enter Entrance Stairs-Rails: Right Entrance Stairs-Number of Steps: 4 Home Layout: Multi-level Home Equipment: Walker - 2 wheels;Cane - single point;Other (comment)      Prior Function Level of Independence: Needs assistance   Gait / Transfers Assistance Needed: Used RW and cane as needed but limited ambulation - pt fatigues easily  ADL's / Homemaking Assistance Needed: assist for LB dressing and IADL  Comments: occasional use of cane and RW     Hand Dominance   Dominant Hand: Right    Extremity/Trunk Assessment   Upper Extremity Assessment Upper Extremity Assessment: Generalized weakness    Lower Extremity Assessment Lower Extremity Assessment: Generalized weakness       Communication   Communication: No difficulties  Cognition Arousal/Alertness: Awake/alert Behavior During Therapy: WFL for tasks assessed/performed Overall Cognitive Status: Within Functional Limits for tasks assessed                                        General Comments      Exercises     Assessment/Plan    PT Assessment Patient needs continued PT services  PT Problem List Decreased strength;Decreased activity tolerance;Decreased balance;Decreased mobility;Decreased knowledge of use of  DME;Obesity       PT Treatment Interventions DME instruction;Gait training;Stair training;Functional mobility training;Therapeutic activities;Therapeutic exercise;Balance training;Patient/family education    PT Goals (Current goals can be found in the Care Plan section)  Acute Rehab PT Goals Patient Stated Goal: Get enough energy to become more active PT Goal Formulation: With patient Time For Goal Achievement: 12/03/17 Potential to Achieve Goals:  Fair    Frequency Min 3X/week   Barriers to discharge        Co-evaluation               AM-PAC PT "6 Clicks" Daily Activity  Outcome Measure Difficulty turning over in bed (including adjusting bedclothes, sheets and blankets)?: A Little Difficulty moving from lying on back to sitting on the side of the bed? : A Little Difficulty sitting down on and standing up from a chair with arms (e.g., wheelchair, bedside commode, etc,.)?: A Little Help needed moving to and from a bed to chair (including a wheelchair)?: A Little Help needed walking in hospital room?: A Little Help needed climbing 3-5 steps with a railing? : A Little 6 Click Score: 18    End of Session Equipment Utilized During Treatment: Gait belt Activity Tolerance: Patient limited by fatigue Patient left: in bed Nurse Communication: Mobility status PT Visit Diagnosis: Unsteadiness on feet (R26.81);History of falling (Z91.81);Difficulty in walking, not elsewhere classified (R26.2)    Time: 1638-4665 PT Time Calculation (min) (ACUTE ONLY): 19 min   Charges:   PT Evaluation $PT Eval Low Complexity: 1 Low     PT G Codes:        Pg [phone]   Arlone Lenhardt 11/19/2017, 10:23 AM

## 2017-11-19 NOTE — Discharge Summary (Signed)
Physician Discharge Summary  Scott Rollins:[id number] DOB: 25-Nov-1950 DOA: 11/16/2017  PCP: [name], MD  Admit date: 11/16/2017 Discharge date: 11/19/2017  Admitted From: Home  disposition: Home  Recommendations for Outpatient Follow-up:  1. Follow up with PCP in 1 week with repeat CBC/BMP 2. Outpatient follow-up with cardiology   Home Health: No.  Patient refused home health Equipment/Devices: Oxygen via nasal cannula 2 L/min  Discharge Condition: Stable CODE STATUS: Full Diet recommendation: Heart Healthy   Brief/Interim Summary: 67 year old male with history of metastatic prostate cancer, COPD, OSA on CPAP at home, hypertension, A. fib on Eliquis, obesity and chronic pain syndrome presented 619 with worsening shortness of breath.  He initially required BiPAP and was started on intravenous Lasix and admitted with acute hypoxic respiratory failure from COPD and CHF exacerbation.  During the hospitalization, his condition improved.  His Solu-Medrol was tapered.  He feels much better and will be discharged on home oxygen with oral prednisone and oral Lasix.     Discharge Diagnoses:  Principal Problem:   Acute hypercapnic respiratory failure (HCC) Active Problems:   OBESITY   Essential hypertension   Coronary artery disease due to lipid rich plaque   EMPHYSEMA   Prostate cancer (HCC)   Chronic gout   Basilar migraine   COPD with acute exacerbation (HCC)   Atrial fibrillation (HCC)   Acute on chronic respiratory failure (HCC)   Obesity hypoventilation syndrome (HCC)   Acute exacerbation of chronic obstructive pulmonary disease (COPD) (HCC)  Acute hypercapnic hypoxic respiratory failure -Due to CHF and COPD exacerbation.  Initially required BiPAP -Improving.  Currently on oxygen via nasal cannula at 2 L/min.   -Patient still requires oxygen via nasal cannula at 2 L/min, his oxygen saturation drops to the 80s off oxygen.  He will be discharged home on oxygen  via nasal cannula at 2 L/min and he need to be compliant with the use of supplemental oxygen  Acute on chronic diastolic heart failure -Echocardiogram shows LVEF of 55 to 40%, grade 1 diastolic dysfunction no wall motion abnormalities. -Negative balance of 6474 cc since admission.   -Discharge on oral Lasix 40 mg twice a day.  Outpatient follow-up with cardiology.  Follow-up repeat BMP in a week   COPD exacerbation -Improving.    Currently on tapering Solu-Medrol.  Switch to oral prednisone 40 mg daily for a week then continue home oral prednisone regimen.  Outpatient follow-up with primary care provider.  Might need outpatient pulmonary evaluation -Continue Symbicort and as needed albuterol  Hypokalemia -Improved.  Hypomagnesemia -Improved  A. fib -Currently rate well controlled  -Continue Cardizem and Eliquis  Hypertension -Continue Lasix, bisoprolol and Cardizem.  Outpatient follow-up  Right lower extremity cellulitis -Very mild -Improved.  Treated with Ancef and Keflex.  No further need for antibiotics  OSA -Continue CPAP.  Outpatient follow-up  Chronic pain syndrome -Outpatient follow-up.  Prostate cancer -Outpatient follow-up with Dr. Alen Blew    Discharge Instructions  Discharge Instructions    Ambulatory referral to Cardiology   Complete by:  As directed    CHF exacerbation   Call MD for:  difficulty breathing, headache or visual disturbances   Complete by:  As directed    Call MD for:  extreme fatigue   Complete by:  As directed    Call MD for:  hives   Complete by:  As directed    Call MD for:  persistant dizziness or light-headedness   Complete by:  As directed  Call MD for:  persistant nausea and vomiting   Complete by:  As directed    Call MD for:  severe uncontrolled pain   Complete by:  As directed    Call MD for:  temperature >100.4   Complete by:  As directed    Diet - low sodium heart healthy   Complete by:  As directed    Increase  activity slowly   Complete by:  As directed      Allergies as of 11/19/2017      Reactions   Amitriptyline Hcl Nausea Only   REACTION: nausea, elevated bp   Ezetimibe Other (See Comments)   REACTION: chest pain, fatigue      Medication List    STOP taking these medications   acetaminophen 500 MG tablet Commonly known as:  TYLENOL   bisoprolol-hydrochlorothiazide 5-6.25 MG tablet Commonly known as:  ZIAC   cyclobenzaprine 10 MG tablet Commonly known as:  FLEXERIL   hydrochlorothiazide 25 MG tablet Commonly known as:  HYDRODIURIL   zolpidem 10 MG tablet Commonly known as:  AMBIEN     TAKE these medications   abiraterone acetate 250 MG tablet Commonly known as:  ZYTIGA Take 4 tablets (1,000 mg total) by mouth daily. Take on an empty stomach 1 hour before or 2 hours after a meal   albuterol 108 (90 Base) MCG/ACT inhaler Commonly known as:  PROAIR HFA Inhale 2 puffs into the lungs every 6 (six) hours as needed for wheezing or shortness of breath.   albuterol (2.5 MG/3ML) 0.083% nebulizer solution Commonly known as:  PROVENTIL Take 3 mLs (2.5 mg total) by nebulization every 4 (four) hours as needed for wheezing or shortness of breath.   allopurinol 300 MG tablet Commonly known as:  ZYLOPRIM Take 1 tablet (300 mg total) by mouth daily.   apixaban 5 MG Tabs tablet Commonly known as:  ELIQUIS Take 1 tablet (5 mg total) by mouth 2 (two) times daily.   aspirin EC 81 MG tablet Take 324 mg by mouth daily.   bisoprolol 5 MG tablet Commonly known as:  ZEBETA Take 1 tablet (5 mg total) by mouth daily. Start taking on:  11/20/2017   budesonide-formoterol 160-4.5 MCG/ACT inhaler Commonly known as:  SYMBICORT Inhale 2 puffs into the lungs 2 (two) times daily.   citalopram 40 MG tablet Commonly known as:  CELEXA Take 1 tablet (40 mg total) by mouth at bedtime. What changed:  how much to take   dextromethorphan-guaiFENesin 30-600 MG 12hr tablet Commonly known as:   MUCINEX DM Take 1 tablet by mouth 2 (two) times daily as needed for cough.   diltiazem 30 MG tablet Commonly known as:  CARDIZEM Take 1 tablet (30 mg total) by mouth every 12 (twelve) hours.   feeding supplement (ENSURE ENLIVE) Liqd Take 237 mLs by mouth 2 (two) times daily between meals.   furosemide 40 MG tablet Commonly known as:  LASIX Take 1 tablet (40 mg total) by mouth 2 (two) times daily. What changed:    how much to take  when to take this  reasons to take this   gabapentin 300 MG capsule Commonly known as:  NEURONTIN Take 1 capsule (300 mg total) by mouth 2 (two) times daily. What changed:  when to take this   Oxycodone HCl 10 MG Tabs Take 1 tablet (10 mg total) by mouth every 8 (eight) hours as needed.   pantoprazole 40 MG tablet Commonly known as:  PROTONIX Take 1 tablet (40  mg total) by mouth daily.   potassium chloride SA 20 MEQ tablet Commonly known as:  K-DUR,KLOR-CON Take 1 tablet (20 mEq total) by mouth daily.   predniSONE 5 MG tablet Commonly known as:  DELTASONE TAKE 1 TABLET BY MOUTH EVERY DAY WITH BREAKFAST starting from 11/28/17 What changed:  See the new instructions.   predniSONE 20 MG tablet Commonly known as:  DELTASONE Take 1 tablet (20 mg total) by mouth daily for 7 days. Start taking on:  11/20/2017 What changed:  You were already taking a medication with the same name, and this prescription was added. Make sure you understand how and when to take each.   PROBIOTIC PO Take 1 capsule by mouth daily.   prochlorperazine 10 MG tablet Commonly known as:  COMPAZINE Take 1 tablet (10 mg total) by mouth every 6 (six) hours as needed for nausea or vomiting.   tamsulosin 0.4 MG Caps capsule Commonly known as:  FLOMAX Take 1 capsule (0.4 mg total) by mouth daily after supper.   traMADol 50 MG tablet Commonly known as:  ULTRAM Take 1 tablet (50 mg total) by mouth every 6 (six) hours as needed.            Durable Medical Equipment   (From admission, onward)        Start     Ordered   11/19/17 1312  DME Oxygen  Once    Question Answer Comment  Mode or (Route) Nasal cannula   Liters per Minute 2   Frequency Continuous (stationary and portable oxygen unit needed)   Oxygen conserving device Yes   Oxygen delivery system Gas      11/19/17 1312   11/19/17 1033  For home use only DME 4 wheeled rolling walker with seat  Once    Question:  Patient needs a walker to treat with the following condition  Answer:  Fear for personal safety   11/19/17 1033     Follow-up Information    [name], [name], MD. Schedule an appointment as soon as possible for a visit in 1 week(s).   Specialty:  Family Medicine Why:  in one week Contact information: [address] [place] [address] [phone]          Allergies  Allergen Reactions  . Amitriptyline Hcl Nausea Only    REACTION: nausea, elevated bp  . Ezetimibe Other (See Comments)    REACTION: chest pain, fatigue    Consultations:  None   Procedures/Studies: Dg Chest Portable 1 View  Result Date: 11/16/2017 CLINICAL DATA:  Dyspnea, increase shortness of breath this morning. History of coronary artery disease, emphysema, former smoker. Obesity. EXAM: PORTABLE CHEST 1 VIEW COMPARISON:  Portable chest x-ray of January 22, 2017 FINDINGS: The lungs are mildly hyperinflated. The interstitial markings are coarse especially the bases but are stable. There is no alveolar infiltrate or pleural effusion. The cardiac silhouette is enlarged. The central pulmonary vascularity is engorged and more prominent today. The mediastinum exhibits mild widening but this is stable. IMPRESSION: Findings consistent with low-grade CHF superimposed upon COPD. No alveolar pneumonia. Electronically Signed   By: David  Martinique M.D.   On: 11/16/2017 07:31     Echocardiogram Study Conclusions  - Left ventricle: The cavity size was normal. Wall thickness was increased in a pattern of  mild LVH. Systolic function was normal. The estimated ejection fraction was in the range of 55% to 60%. Wall motion was normal; there were no regional wall motion abnormalities.  Doppler parameters are consistent with abnormal left ventricular relaxation (grade 1 diastolic dysfunction). - Mitral valve: Calcified annulus. Mildly thickened leaflets . - Left atrium: The atrium was mildly dilated. - Right ventricle: The cavity size was mildly dilated. - Right atrium: The atrium was mildly dilated.  Impressions:  - Normal LV systolic function; mild diastolic dysfunction; mild LVH; mild LAE, RAE and RVE.      Subjective: Patient seen and examined at bedside.  He denies any overnight worsening cough or shortness of breath.  No fever, nausea or vomiting.  Discharge Exam: Vitals:   11/19/17 1202 11/19/17 1236  BP:    Pulse:    Resp:    Temp:    SpO2: 94% 93%   Vitals:   11/18/17 2149 11/19/17 0601 11/19/17 1202 11/19/17 1236  BP: (!) 178/86 (!) 174/94    Pulse: 63 65    Resp: 19 18    Temp: 97.9 F (36.6 C) 98.2 F (36.8 C)    TempSrc: Oral Oral    SpO2: 94% 92% 94% 93%  Weight:      Height:        General: Pt is alert, awake, not in acute distress Cardiovascular: Rate controlled, S1/S2 + Respiratory: Bilateral decreased breath sounds at bases  abdominal: Soft, NT, ND, bowel sounds + Extremities: 1+ edema, no cyanosis    The results of significant diagnostics from this hospitalization (including imaging, microbiology, ancillary and laboratory) are listed below for reference.     Microbiology: Recent Results (from the past 240 hour(s))  MRSA PCR Screening     Status: None   Collection Time: 11/17/17  2:50 AM  Result Value Ref Range Status   MRSA by PCR NEGATIVE NEGATIVE Final    Comment:        The GeneXpert MRSA Assay (FDA approved for NASAL specimens only), is one component of a comprehensive MRSA colonization surveillance program. It is  not intended to diagnose MRSA infection nor to guide or monitor treatment for MRSA infections. Performed at [facility], [place] [address] [place],  [address]      Labs: BNP (last 3 results) Recent Labs    01/21/17 1026 11/16/17 0832 11/17/17 0334  BNP 55.1 275.9* 951.8*   Basic Metabolic Panel: Recent Labs  Lab 11/16/17 0757 11/17/17 0334 11/19/17 0417  NA 140 140 138  K 3.1* 3.4* 3.7  CL 89* 87* 86*  CO2  --  39* 38*  GLUCOSE 104* 152* 149*  BUN 9 20 36*  CREATININE 0.80 0.79 0.83  CALCIUM  --  9.1 8.8*  MG  --  1.6* 2.1   Liver Function Tests: Recent Labs  Lab 11/17/17 0334  AST 24  ALT 13*  ALKPHOS 68  BILITOT 0.7  PROT 7.0  ALBUMIN 3.6   No results for input(s): LIPASE, AMYLASE in the last 168 hours. No results for input(s): AMMONIA in the last 168 hours. CBC: Recent Labs  Lab 11/16/17 0736 11/16/17 0757 11/16/17 0832 11/17/17 0334  WBC SPECIMEN CLOTTED  --  9.4 8.5  NEUTROABS SPECIMEN CLOTTED  --  7.8*  --   HGB SPECIMEN CLOTTED 15.0 12.1* 13.3  HCT SPECIMEN CLOTTED 44.0 39.5 44.2  MCV SPECIMEN CLOTTED  --  95.9 92.9  PLT SPECIMEN CLOTTED  --  200 252   Cardiac Enzymes: No results for input(s): CKTOTAL, CKMB, CKMBINDEX, TROPONINI in the last 168 hours. BNP: Invalid input(s): POCBNP CBG: Recent Labs  Lab 11/18/17 0741 11/18/17 1157 11/18/17 1747 11/19/17  1914 11/19/17 1108  GLUCAP 167* 177* 125* 129* 163*   D-Dimer No results for input(s): DDIMER in the last 72 hours. Hgb A1c No results for input(s): HGBA1C in the last 72 hours. Lipid Profile No results for input(s): CHOL, HDL, LDLCALC, TRIG, CHOLHDL, LDLDIRECT in the last 72 hours. Thyroid function studies No results for input(s): TSH, T4TOTAL, T3FREE, THYROIDAB in the last 72 hours.  Invalid input(s): FREET3 Anemia work up No results for input(s): VITAMINB12, FOLATE, FERRITIN, TIBC, IRON, RETICCTPCT in the last 72 hours. Urinalysis    Component  Value Date/Time   COLORURINE YELLOW 01/21/2017 1401   APPEARANCEUR HAZY (A) 01/21/2017 1401   LABSPEC 1.028 01/21/2017 1401   PHURINE 5.0 01/21/2017 1401   GLUCOSEU NEGATIVE 01/21/2017 1401   HGBUR SMALL (A) 01/21/2017 1401   HGBUR negative 08/21/2010 0831   BILIRUBINUR NEGATIVE 01/21/2017 1401   KETONESUR NEGATIVE 01/21/2017 1401   PROTEINUR NEGATIVE 01/21/2017 1401   UROBILINOGEN 0.2 08/21/2010 0831   NITRITE NEGATIVE 01/21/2017 1401   LEUKOCYTESUR NEGATIVE 01/21/2017 1401   Sepsis Labs Invalid input(s): PROCALCITONIN,  WBC,  LACTICIDVEN Microbiology Recent Results (from the past 240 hour(s))  MRSA PCR Screening     Status: None   Collection Time: 11/17/17  2:50 AM  Result Value Ref Range Status   MRSA by PCR NEGATIVE NEGATIVE Final    Comment:        The GeneXpert MRSA Assay (FDA approved for NASAL specimens only), is one component of a comprehensive MRSA colonization surveillance program. It is not intended to diagnose MRSA infection nor to guide or monitor treatment for MRSA infections. Performed at [facility], [place] [address], [place], [place] [address]      Time coordinating discharge: 35 minutes  SIGNED:   Aline August, MD  [facility] 11/19/2017, 1:17 PM Pager: [phone]  If 7PM-7AM, please contact night-coverage [email or web address] Password [email or web address]

## 2017-11-19 NOTE — Progress Notes (Signed)
SATURATION QUALIFICATIONS: (This note is used to comply with regulatory documentation for home oxygen)  Patient Saturations on Room Air at Rest = 90%  Patient Saturations on Room Air while Ambulating = 83%  Patient Saturations on 2 Liters of oxygen while Ambulating = 90%  Please briefly explain why patient needs home oxygen: patient desats with ambulation, already has oxygen at home.

## 2017-11-19 NOTE — Consult Note (Addendum)
   [facility] CM Inpatient Consult   11/19/2017  DEANGLO HISSONG 09/16/1950 [id number]    Mr. Jeffries is active with [place] Management program. He is followed by [facility] Pharmacist and [place]. Please see chart review tab then encounters for patient outreach details.   Spoke with Mr. Bastin at bedside prior to hospital discharge. Discussed ongoing [place] Management follow up. Mr. Horsey expressed appreciation of [place] Management involvement thus far. States " [facility] has been of great assistance to me".  Discussed referral for [place]. Mr. Stukey is agreeable.   Mr. Raucci also endorses that he has had a lot going on as of late with his health. He states " I will do better in staying connected with your program". Confirmed best contact numbers for Mr. Trampe as cell [phone] and home [phone].  Updated [facility] Care Management consent obtained. [facility] Care Management folder provided.   Will request to be assigned to [place] for follow up as well. Will update [facility] Pharmacist and [place]. Mr Cogdell has an extreme unplanned readmission risk score of 33%.  Mr. Duval PCP is Dr. Anitra Lauth. [place] [facility] is listed as doing transition of care call.  Made inpatient RNCM aware that [facility] is active. Inpatient RNCM indicates home health services were declined.    Marthenia Rolling, MSN-Ed, RN,BSN [facility] Liaison [phone]

## 2017-11-19 NOTE — Plan of Care (Signed)
Discharge instructions reviewed with patient, questions answered, verbalized understanding.  Medications from pharmacy retrieved and returned to patient. Patient awaiting his wife to arrive for transportation home.

## 2017-11-19 NOTE — Progress Notes (Signed)
Spoke with pt concerning [place] needs. Pt states that he don't feel that he needs HH right now. Maybe down the road. Explained to pt his PCP will be able to setup [facility] when he need it and to call his PCP.

## 2017-11-20 ENCOUNTER — Ambulatory Visit: Payer: Self-pay | Admitting: Pharmacist

## 2017-11-20 ENCOUNTER — Telehealth: Payer: Self-pay

## 2017-11-20 ENCOUNTER — Other Ambulatory Visit: Payer: Self-pay | Admitting: Pharmacist

## 2017-11-20 ENCOUNTER — Other Ambulatory Visit: Payer: Self-pay

## 2017-11-20 NOTE — Patient Outreach (Signed)
[facility]) Care Management  11/20/2017  TULLIO CHAUSSE Apr 22, 1951 [id number]   Called patient regarding post discharge medication reconciliation. Unfortunately, he did not answer the phone. HIPAA compliant message was left on his voicemail.  Plan: Unsuccessful contact letter sent to the patient. Call patient back in 3-4 business days.  [name], PharmD, [place] Clinical Pharmacist [phone]

## 2017-11-20 NOTE — Telephone Encounter (Signed)
Transition Care Management Follow-up Telephone Call  Admit date: 11/16/2017 Discharge date: 11/19/2017  Principal Problem:   Acute hypercapnic respiratory failure ([place])   How have you been since you were released from the hospital? "I feel better"   Do you understand why you were in the hospital? yes   Do you understand the discharge instructions? yes   Where were you discharged to? Home. Lives with wife.    Items Reviewed:  Medications reviewed: no, advised to bring to appt. Now on Oxygen 2L at home.   Allergies reviewed: yes  Dietary changes reviewed: yes  Referrals reviewed: yes   Functional Questionnaire:   Activities of Daily Living (ADLs):   He states they are independent in the following: ambulation, bathing and hygiene, feeding, continence, grooming, toileting and dressing. Ambulates with rolling walker.  States they require assistance with the following: None.    Any transportation issues/concerns?: no   Any patient concerns? no   Confirmed importance and date/time of follow-up visits scheduled yes  Provider Appointment booked with PCP 11/26/17.   Confirmed with patient if condition begins to worsen call PCP or go to the ER.  Patient was given the office number and encouraged to call back with question or concerns.  : yes

## 2017-11-20 NOTE — Telephone Encounter (Signed)
Noted: nurse phone contact with patient for TCM. Signed:  Crissie Sickles, MD           11/20/2017

## 2017-11-20 NOTE — Patient Outreach (Signed)
[facility]) Care Management  11/20/2017  Scott Rollins June 22, 1951 [id number]   Referral received 11/20/17. 67 year old with history of HTN, COPD, heart failure. Recently admitted for COPD/pneumonia.  Client reports he has follow up appointment with provider scheduled for next week. He reports he is going to discuss pulmonology referral at his next appointment.   Medications reviewed. Client with no questions at this time.  Client without any questions or concerns. Client encouraged to call RNCM as needed and 24 hour nurse advice line reinforced.  Plan: RNCM will follow up telephonically next week. Home visit scheduled.  [name], RN, MSN, [place] Coordinator Cell: [phone]

## 2017-11-24 ENCOUNTER — Telehealth: Payer: Self-pay | Admitting: *Deleted

## 2017-11-24 ENCOUNTER — Telehealth: Payer: Self-pay | Admitting: Oncology

## 2017-11-24 NOTE — Telephone Encounter (Signed)
Rescheduled lab per 5/13 sch msg - spoke w/ pt re appts.

## 2017-11-24 NOTE — Telephone Encounter (Signed)
Called patient and reviewed his schedule for May with him. He verbalized understanding.

## 2017-11-25 ENCOUNTER — Ambulatory Visit: Payer: Self-pay | Admitting: Pharmacist

## 2017-11-25 ENCOUNTER — Other Ambulatory Visit: Payer: Self-pay | Admitting: Pharmacist

## 2017-11-25 NOTE — Patient Outreach (Signed)
[facility]) Care Management  11/25/2017  Scott Rollins 1950/10/18 [id number]   Called patient regarding post discharge medication review and to assist patient with medication assistance. Patient was previously waiting on his Restaurant manager, fast food.  Plan:  Call patient back in 3-5 business days.  [name], PharmD, [place] Clinical Pharmacist [phone]

## 2017-11-26 ENCOUNTER — Inpatient Hospital Stay: Payer: Medicare Other | Admitting: Family Medicine

## 2017-11-27 ENCOUNTER — Other Ambulatory Visit: Payer: Self-pay

## 2017-11-27 NOTE — Patient Outreach (Signed)
[facility]) Care Management  11/27/2017  Scott Rollins 1950/09/30 [id number]   Referral received 11/20/17. 67 year old with history of HTN, COPD, heart failure, metastatic prostate cancer, OSA/CPAP, atrial fibrillation, chronic pain syndrome. Recently admitted 11/16/17-11/19/17 for COPD/pneumonia.  RNCM called to follow up. No answer. HIPPA compliant message left.  Plan: attend previously scheduled home visit next week.  [name], RN, MSN, [place] Coordinator Cell: [phone]

## 2017-12-01 ENCOUNTER — Other Ambulatory Visit: Payer: Self-pay | Admitting: Pharmacist

## 2017-12-01 ENCOUNTER — Encounter: Payer: Self-pay | Admitting: Pharmacist

## 2017-12-01 NOTE — Patient Outreach (Addendum)
[facility]) Care Management  12/01/2017  Scott Rollins 11-Dec-1950 [id number]  Subjective Called Scott Rollins for post discharge medication review and for medication assistance (Eliquis, ProAir HFA, Symbicort). HIPAA identifiers were obtained. Patient has multiple medical conditions including but not limited to:  COPD< A fib, DJD, prostate cancer,OSA, obesity, hypertension, and history of tobacco abuse.  Patient has been difficult to get ahold of over the last few months, with multiple calls to assess need for medication assistance unanswered.  He was recently hospitalized for COPD exacerbation.  Today, patient states that he felt overwhelmed with all of his medications after being discharged from the hospital, but he feels that he is slowly figuring it all out.  Patient states that he is having difficulty obtaining his Zytiga from the cancer center, as orders for a new bottle of medication are not being placed in time for him to continue therapy uninterrupted. Patient states that 1 month ago, he was without medication for over a week. The nurse at the cancer center advised patient that he should call her 10 days in advance from when he will be out of medication so that the order will be placed in time.  Patient reports that he has been out of his Eliquis for 1-2 months and has not taken it since then. He was given it in the hospital during this recent admission.  Patient also reports that he has been out of his Symbicort inhaler for over 1 month now, and that he has opened his last Proair inhaler and that it has 87 inhalations left. He called [employer] to refill his ProAir inhaler, but reports that the representative informed him that he was out of refills and needed to send in a new application.  Patient also states that he has forgotten to continue K-Dur and that he discontinued prednisone due to it causing him to feel unsteady on his feet and unable to walk without a walker.     Objective:  Date Discharged from Hospital: 11/19/17 Date Medication Reconciliation Performed: 12/01/2017   Medications Discontinued at Discharge:   Acetaminophen 500 mg tablets  Bisoprolol-HCTZ 5-6.25 tablets  cyclobenzaprine 10 mg tablets  HCTZ 25 mg tablets   zoplidem 10 mg tablets   New Medications at Discharge:   Bisoprolol 5 mg tablets - take 1 tablet by mouth daily  BMP Latest Ref Rng & Units 11/19/2017 11/17/2017 11/16/2017  Glucose 65 - 99 mg/dL 149(H) 152(H) 104(H)  BUN 6 - 20 mg/dL 36(H) 20 9  Creatinine 0.61 - 1.24 mg/dL 0.83 0.79 0.80  Sodium 135 - 145 mmol/L 138 140 140  Potassium 3.5 - 5.1 mmol/L 3.7 3.4(L) 3.1(L)  Chloride 101 - 111 mmol/L 86(L) 87(L) 89(L)  CO2 22 - 32 mmol/L 38(H) 39(H) -  Calcium 8.9 - 10.3 mg/dL 8.8(L) 9.1 -   Medications Reviewed Today    Reviewed by [name], [place] (Pharmacist) on 12/01/17 at 1047  Med List Status: <None>  Medication Order Taking? Sig Documenting Provider Last Dose Status Informant  abiraterone acetate (ZYTIGA) 250 MG tablet [id number] Yes Take 4 tablets (1,000 mg total) by mouth daily. Take on an empty stomach 1 hour before or 2 hours after a meal Shadad, Mathis Dad, MD Taking Active Self  albuterol (PROAIR HFA) 108 (90 Base) MCG/ACT inhaler [id number] Yes Inhale 2 puffs into the lungs every 6 (six) hours as needed for wheezing or shortness of breath. [name], MD Taking Active Self  albuterol (PROVENTIL) (2.5 MG/3ML) 0.083% nebulizer  solution [id number] Yes Take 3 mLs (2.5 mg total) by nebulization every 4 (four) hours as needed for wheezing or shortness of breath. [name], MD Taking Active Self  allopurinol (ZYLOPRIM) 300 MG tablet [id number] Yes Take 1 tablet (300 mg total) by mouth daily. [name], MD Taking Active Self  apixaban (ELIQUIS) 5 MG TABS tablet [id number] Yes Take 1 tablet (5 mg total) by mouth 2 (two) times daily. [name], MD Taking Active Self           Med Note Valli Glance Dec 01, 2017 10:32 AM) Out of due to cost  aspirin 325 MG EC tablet [id number] Yes Take 325 mg by mouth daily. [provider] Taking Active   bisoprolol (ZEBETA) 5 MG tablet [id number] Yes Take 1 tablet (5 mg total) by mouth daily. Aline August, MD Taking Active   budesonide-formoterol [facility]) 160-4.5 MCG/ACT inhaler [id number] Yes Inhale 2 puffs into the lungs 2 (two) times daily. [name], MD Taking Active Self           Med Note Valli Glance Dec 01, 2017 10:32 AM) Out of due to cost   citalopram (CELEXA) 40 MG tablet [id number] Yes Take 1 tablet (40 mg total) by mouth at bedtime. Aline August, MD Taking Active   dextromethorphan-guaiFENesin [facility] DM) 30-600 MG 12hr tablet [id number] Yes Take 1 tablet by mouth 2 (two) times daily as needed for cough. [provider] Taking Active Self           Med Note [name]   Tue Dec 01, 2017 10:33 AM) PRN only  diltiazem (CARDIZEM) 30 MG tablet [id number] Yes Take 1 tablet (30 mg total) by mouth every 12 (twelve) hours. Fay Records, MD Taking Active Self  feeding supplement, ENSURE Jeanne Ivan (ENSURE ENLIVE) LIQD [id number] Yes Take 237 mLs by mouth 2 (two) times daily between meals.  Patient taking differently:  Take 237 mLs by mouth. 237 mls --Three to four times per day.   Hosie Poisson, MD Taking Active Self  furosemide (LASIX) 40 MG tablet [id number] Yes Take 1 tablet (40 mg total) by mouth 2 (two) times daily. Aline August, MD Taking Active   gabapentin (NEURONTIN) 300 MG capsule [id number] Yes Take 1 capsule (300 mg total) by mouth 2 (two) times daily. Aline August, MD Taking Active   Oxycodone HCl 10 MG TABS [id number] Yes Take 1 tablet (10 mg total) by mouth every 8 (eight) hours as needed. Wyatt Portela, MD Taking Active Self  pantoprazole (PROTONIX) 40 MG tablet [id number] Yes Take 1 tablet (40 mg total) by mouth daily. [name], MD Taking Active Self  potassium chloride SA  (K-DUR,KLOR-CON) 20 MEQ tablet [id number] No Take 1 tablet (20 mEq total) by mouth daily.  Patient not taking:  Reported on 12/01/2017   Wyatt Portela, MD Not Taking Active Self  predniSONE (DELTASONE) 5 MG tablet [id number] No TAKE 1 TABLET BY MOUTH EVERY DAY WITH BREAKFAST starting from 11/28/17  Patient not taking:  Reported on 12/01/2017   Aline August, MD Not Taking Active   Probiotic Product (PROBIOTIC PO) [id number] Yes Take 1 capsule by mouth daily. [provider] Taking Active Self  prochlorperazine (COMPAZINE) 10 MG tablet [id number] Yes Take 1 tablet (10 mg total) by mouth every 6 (six) hours as needed for nausea or vomiting. Wyatt Portela, MD Taking Active Self  tamsulosin [facility]) 0.4 MG CAPS capsule [id number]  Yes Take 1 capsule (0.4 mg total) by mouth daily after supper. [name], MD Taking Active Self  traMADol [facility]bon) 50 MG tablet [id number] Yes Take 1 tablet (50 mg total) by mouth every 6 (six) hours as needed. Wyatt Portela, MD Taking Active Self         Patient was recently discharged from hospital and all medications have been reviewed  Assessment:  Drugs sorted by system:  Neurologic/Psychologic: citalopram 40 mg, gabapentin 300 mg   Cardiovascular: Eliquis 5 mg, aspirin 325 mg, bisoprolol 5 mg, diltiazem 30 mg, furosemide 40 mg  Pulmonary/Allergy: ProAir HFA, Proventil nebulization solution, Symbicort HFA  Gastrointestinal: pantoprazole 40 mg, probiotic   Endocrine: allopurinol 300 mg   Renal: K-DUR 20 mEq, tamsulosin 0.4 mg  Pain: Oxycodone 10 mg, tramadol 50 mg   Miscellaneous: Zytiga 250 mg (prostate cancer), Mucinex DM  Other issues noted: Nonadherence to Eliquis, nonadherence to Symbicort   AFib -Anticoagulation for atrial fibrillation uncontrolled, due to nonadherence to Eliquis due to lapse in eligibility. Patient needs to reapply for assistance.   COPD -COPD currently uncontrolled, with recent hospitalization for pulmonary  hypertension d/t COPD exacerbations. Uncontrolled due to nonadherence to Symbicort, as he did not reorder it from [employer]'s patient assistance program. Eligibility lapses in June and he needs to reapply for assistance. Additionally, patient's ProAir patient assistance eligibility lapses in June, but he is out of refills and needs to reapply for assistance.   Electrolytes -Potassium depleted, per pre-discharge BMET 11/19/17. Depletion possibly due to concurrent furosemide therapy and nonadherence to K-DUR. -patient has follow up appointment with PCP on 12/02/17  PLAN: -Instructed patient to take new medications as prescribed and discontinue old medications as prescribed. -Will send a message to patient's provider, Dr. Anitra Lauth, informing him of Mr. Herberg's nonadherence to Eliquis. Will ask if he has any Eliquis samples that the office can provide to the patient.  -Provided patient with phone numbers for [employer] and [employer] patient assitence programs, so that he may refill his ProAir and Symbicort.  -Informed patient to restart K-DUR tablets; will send message to provider  -Patient has an upcoming home visit with [name], RN tomorrow, 12/02/17 - will provide her with patient assistance application forms for Mr. Pucillo to fill out, in order to resume Symbicort, ProAir, and Eliquis therapy.  Cleotis Lema, PharmD Candidate 12/01/17  [name], PharmD, [place] Clinical Pharmacist [phone]

## 2017-12-02 ENCOUNTER — Other Ambulatory Visit: Payer: Self-pay

## 2017-12-02 ENCOUNTER — Encounter: Payer: Self-pay | Admitting: Family Medicine

## 2017-12-02 ENCOUNTER — Ambulatory Visit (INDEPENDENT_AMBULATORY_CARE_PROVIDER_SITE_OTHER): Payer: Medicare Other | Admitting: Family Medicine

## 2017-12-02 VITALS — BP 135/72 | HR 71 | Temp 98.2°F | Resp 20 | Ht 70.0 in | Wt 269.0 lb

## 2017-12-02 DIAGNOSIS — I5032 Chronic diastolic (congestive) heart failure: Secondary | ICD-10-CM

## 2017-12-02 DIAGNOSIS — J9611 Chronic respiratory failure with hypoxia: Secondary | ICD-10-CM

## 2017-12-02 DIAGNOSIS — I10 Essential (primary) hypertension: Secondary | ICD-10-CM | POA: Diagnosis not present

## 2017-12-02 DIAGNOSIS — J449 Chronic obstructive pulmonary disease, unspecified: Secondary | ICD-10-CM | POA: Diagnosis not present

## 2017-12-02 DIAGNOSIS — K219 Gastro-esophageal reflux disease without esophagitis: Secondary | ICD-10-CM

## 2017-12-02 DIAGNOSIS — C61 Malignant neoplasm of prostate: Secondary | ICD-10-CM | POA: Diagnosis not present

## 2017-12-02 DIAGNOSIS — G4733 Obstructive sleep apnea (adult) (pediatric): Secondary | ICD-10-CM

## 2017-12-02 MED ORDER — LISINOPRIL 10 MG PO TABS: 10.0000 mg | ORAL_TABLET | Freq: Every day | ORAL | 1 refills | Status: DC

## 2017-12-02 MED ORDER — ALBUTEROL SULFATE (2.5 MG/3ML) 0.083% IN NEBU: 2.5000 mg | INHALATION_SOLUTION | RESPIRATORY_TRACT | 3 refills | Status: AC | PRN

## 2017-12-02 MED ORDER — FUROSEMIDE 40 MG PO TABS: 40.0000 mg | ORAL_TABLET | Freq: Every day | ORAL | 1 refills | Status: DC

## 2017-12-02 MED ORDER — UREA 40 % EX CREA: TOPICAL_CREAM | CUTANEOUS | 6 refills | Status: AC

## 2017-12-02 NOTE — Progress Notes (Signed)
Noted. I saw pt in office today.

## 2017-12-02 NOTE — Addendum Note (Signed)
Addended by: [name] on: 12/02/2017 04:53 PM   Modules accepted: Orders

## 2017-12-02 NOTE — Progress Notes (Addendum)
OFFICE VISIT  12/02/2017   CC:  Chief Complaint  Patient presents with  . Follow-up    RCI, pt is not fasting.   HPI:    Patient is a 67 y.o. Caucasian male who presents for f/u HTN, COPD, and GERD.  I last saw him over 6 mo ago. He has metastatic prostate cancer, significant bone pain in L pelvis, L femur, and low back.   Dr. Alen Blew is oncologist.  Fabio Asa and lupron current therapies, was on prednisone but due to tremulousness this is being weened away as of  f/u last month 11/05/17.  Next bone scan and CT scan scheduled for next week. Pain control with tramadol and oxycodone per Dr. Alen Blew.  HTN: home monitoring shows bp uncontrolled.  170s/90s.    COPD: admitted to [facility] 11/16/17 for acute CHF and acute COPD exac.  Diuresed about 6 L in hosp. Went home on steroid taper as well as 40mg  lasix bid.  He decreased his dose to 40mg  once daily but unclear when and why.   Needs BMET recheck today. Needs pulm rehab.  Uses 2.5L oxygen qhs. Breathing feels like his baseline, most recent albuterol use was about 8 h ago.  Compliant with symbicort, usually uses albut neb tid. He is trying to eat low sodium and low fat diet.  GERD: started pt on PPI last time I saw him.  This helps a lot and he takes this most days.  OSA: pt has hx of OSA, was on CPAP, says machine not working for a long time now.  Asks me to order new machine, supplies, etc.  Says sleep study was years ago.  He does not recall who did the sleep study or ordered his CPAP.  ROS: some orthostatic dizziness, +hot flashes, no fevers.  Appetite is up and down.  Thirsty all the time. No HAs, no abd pain, no n/v.  He does have some instability of gait, uses a walker sometimes-esp in home. No melena/hematochezia, no palpitations, no CP.  Past Medical History:  Diagnosis Date  . Anginal pain ([place])   . Anxiety   . Arthritis    "hips, knees, thoracic back" (01/22/2017)  . CAP (community acquired pneumonia) 11/2016  . 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 COPD (chronic obstructive pulmonary disease) ([place])    "pearl study" pt thinks he is on Symbicort; pt noncompliant with COPD controller meds on/off due to financial reasons.  . Coronary atherosclerosis of unspecified type of vessel, native or graft 2005   MI, s/p PCI with stent (pt reports 20+ interventions in the past, most recent cat 6/08 showed patent stents).  Normal LV function.  Nuclear stress test NEG 8/09.  . Depression    ?bipolar dx by psychiatrist?  . Diverticulosis 2006  . ED (erectile dysfunction)   . Gout   . Gouty arthritis    Dr. Amil Amen  . Heart murmur   . Hematochezia 09/2012   Hyperplastic polyp, diverticulosis, and internal hemorrhoids found on colonoscopy 10/2012  . History of hiatal hernia   . Hyperkalemia 01/29/2017   Kayexalate 30 given x 1.  . Hyperlipidemia    Hx of multiple statin intolerance  . Hypertension   . 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 Metastasis from malignant neoplasm of prostate ([place])    "to spine & left hip; dx'd 01/21/2017"  . Migraine    "none in the 2000s" (01/15/2017)  . Mitral valve prolapse ~  1981  . Myocardial infarction [facility]) ~ 1993- 2005 X 3-4   (01/15/2017)  . Neuropathic pain of both feet 2015/2016   Vit B12 and A1c normal 10/2014  . Obesities, morbid ([place])   . On home oxygen therapy    "2L prn" (01/15/2017)  . OSA on CPAP    w/oxygen (01/15/2017)  . Osteoarthrosis, unspecified whether generalized or localized, unspecified site    DDD  . PAF (paroxysmal atrial fibrillation) ([place]) 12/2016   Started in the context of resp illness/lots of bronchodilators.  Started eliquis and cardizem during hospitalization 01/2017 (Dr. Rayann Heman).  . Pre-diabetes 2016/17  . Prostate cancer metastatic to multiple sites [facility]) 09/2010; 2018   2012: Localized, high risk disease (Dr. Kimbrough/[place]):  s/p 5 wks ext bm rad + seed boost, as well as hormone blockade x 1 yr.  Biochemical  recurrence 11/2015;  2018 imaging showed bone mets and lymph node involvement.  Palliative Lupron and zytiga 2018/19--good PSA response as of 09/2017 onc f/u.  Marland Kitchen Radiation    Pelvic--for prostate ca: 25 treatments/01/2011  . Seizure disorder ([place]) 08-15-2011   Deja vu sensations: no w/u.  These stopped when neurontin 300mg  tid was started for a different reason.  . Status post chemotherapy    10/2010 thru 06/2011  . Tobacco dependence    still smoking as of fall 2018.    Past Surgical History:  Procedure Laterality Date  . CARDIAC CATHETERIZATION  23 caths  . COLONOSCOPY WITH PROPOFOL N/A 04/05/2013   Dr. Fuller Plan.  Polypectomy (hyperplastic--recall 10 yrs).  Moderate diverticulosis, +internal hemorrhoids.  No radiation proctitis.  . CORONARY ANGIOPLASTY    . CORONARY ANGIOPLASTY WITH STENT PLACEMENT     "5-6 stents" (01/15/2017)  . HERNIA REPAIR    . HOT HEMOSTASIS N/A 04/05/2013   Procedure: HOT HEMOSTASIS (ARGON PLASMA COAGULATION/BICAP);  Surgeon: Ladene Artist, MD;  Location: [facility];  Service: Endoscopy;  Laterality: N/A;  . INSERTION PROSTATE RADIATION SEED  02/24/2011  . TRANSTHORACIC ECHOCARDIOGRAM  12/2016   EF 55-60%, moderate LVH, grd I DD, mild LA dilation.  Marland Kitchen UMBILICAL HERNIA REPAIR      Outpatient Medications Prior to Visit  Medication Sig Dispense Refill  . abiraterone acetate (ZYTIGA) 250 MG tablet Take 4 tablets (1,000 mg total) by mouth daily. Take on an empty stomach 1 hour before or 2 hours after a meal 120 tablet 0  . albuterol (PROAIR HFA) 108 (90 Base) MCG/ACT inhaler Inhale 2 puffs into the lungs every 6 (six) hours as needed for wheezing or shortness of breath. 18 g 3  . albuterol (PROVENTIL) (2.5 MG/3ML) 0.083% nebulizer solution Take 3 mLs (2.5 mg total) by nebulization every 4 (four) hours as needed for wheezing or shortness of breath. 75 mL 3  . allopurinol (ZYLOPRIM) 300 MG tablet Take 1 tablet (300 mg total) by mouth daily. 90 tablet 3  . aspirin 325 MG  EC tablet Take 325 mg by mouth daily.    . bisoprolol (ZEBETA) 5 MG tablet Take 1 tablet (5 mg total) by mouth daily. 30 tablet 0  . budesonide-formoterol (SYMBICORT) 160-4.5 MCG/ACT inhaler Inhale 2 puffs into the lungs 2 (two) times daily. 3 Inhaler 3  . citalopram (CELEXA) 40 MG tablet Take 1 tablet (40 mg total) by mouth at bedtime.    Marland Kitchen dextromethorphan-guaiFENesin (MUCINEX DM) 30-600 MG 12hr tablet Take 1 tablet by mouth 2 (two) times daily as needed for cough.    . diltiazem (CARDIZEM) 30 MG tablet Take 1 tablet (  30 mg total) by mouth every 12 (twelve) hours. 60 tablet 11  . feeding supplement, ENSURE ENLIVE, (ENSURE ENLIVE) LIQD Take 237 mLs by mouth 2 (two) times daily between meals. (Patient taking differently: Take 237 mLs by mouth. 237 mls --Three to four times per day.) 237 mL 12  . furosemide (LASIX) 40 MG tablet Take 1 tablet (40 mg total) by mouth 2 (two) times daily. 60 tablet 0  . gabapentin (NEURONTIN) 300 MG capsule Take 1 capsule (300 mg total) by mouth 2 (two) times daily.    . Oxycodone HCl 10 MG TABS Take 1 tablet (10 mg total) by mouth every 8 (eight) hours as needed. 90 tablet 0  . pantoprazole (PROTONIX) 40 MG tablet Take 1 tablet (40 mg total) by mouth daily. 30 tablet 6  . potassium chloride SA (K-DUR,KLOR-CON) 20 MEQ tablet Take 1 tablet (20 mEq total) by mouth daily. 14 tablet 0  . predniSONE (DELTASONE) 5 MG tablet TAKE 1 TABLET BY MOUTH EVERY DAY WITH BREAKFAST starting from 11/28/17    . Probiotic Product (PROBIOTIC PO) Take 1 capsule by mouth daily.    . prochlorperazine (COMPAZINE) 10 MG tablet Take 1 tablet (10 mg total) by mouth every 6 (six) hours as needed for nausea or vomiting. 30 tablet 2  . tamsulosin (FLOMAX) 0.4 MG CAPS capsule Take 1 capsule (0.4 mg total) by mouth daily after supper. 30 capsule 3  . traMADol (ULTRAM) 50 MG tablet Take 1 tablet (50 mg total) by mouth every 6 (six) hours as needed. 90 tablet 1  . apixaban (ELIQUIS) 5 MG TABS tablet Take 1  tablet (5 mg total) by mouth 2 (two) times daily. (Patient not taking: Reported on 12/02/2017) 180 tablet 1   No facility-administered medications prior to visit.     Allergies  Allergen Reactions  . Amitriptyline Hcl Nausea Only    REACTION: nausea, elevated bp  . Ezetimibe Other (See Comments)    REACTION: chest pain, fatigue    ROS As per HPI  PE: Blood pressure 135/72, pulse 71, temperature 98.2 F (36.8 C), temperature source Oral, resp. rate 20, height 5\' 10"  (1.778 m), weight 269 lb (122 kg), SpO2 93 %. Gen: Alert, well appearing.  Patient is oriented to person, place, time, and situation. AFFECT: pleasant, lucid thought and speech. CV: RRR, no m/r/g LUNGS: CTA on inspiration, occ trace end exp wheezing noted, with prolonged exp phase.  Nonlabored resps. Abd: soft, rotund, nontender. EXT: RLL with 2+ pitting edema.  L LL with 4+ pitting edema. R pretibial region with significant flaking of epidermis, with brawny, dense fibrotic changes in pretibial regions bilat.  LABS:    Chemistry      Component Value Date/Time   NA 138 11/19/2017 0417   NA 141 06/26/2017 1444   K 3.7 11/19/2017 0417   K 4.3 06/26/2017 1444   CL 86 (L) 11/19/2017 0417   CO2 38 (H) 11/19/2017 0417   CO2 30 (H) 06/26/2017 1444   BUN 36 (H) 11/19/2017 0417   BUN 18.0 06/26/2017 1444   CREATININE 0.83 11/19/2017 0417   CREATININE 1.12 11/05/2017 1139   CREATININE 1.0 06/26/2017 1444      Component Value Date/Time   CALCIUM 8.8 (L) 11/19/2017 0417   CALCIUM 9.4 06/26/2017 1444   ALKPHOS 68 11/17/2017 0334   ALKPHOS 103 06/26/2017 1444   AST 24 11/17/2017 0334   AST 19 11/05/2017 1139   AST 20 06/26/2017 1444   ALT 13 (L) 11/17/2017 8315  ALT 14 11/05/2017 1139   ALT 21 06/26/2017 1444   BILITOT 0.7 11/17/2017 0334   BILITOT 0.6 11/05/2017 1139   BILITOT 0.47 06/26/2017 1444       IMPRESSION AND PLAN:  1) COPD, severe.  Continue symbicort, albuterol, oxygen hs.  Med regimen not maxed  due to financial constraints. Ordered referral to pulm rehab today.  2) Chronic diastolic HF; doing well on daily lasix 40mg .  Will continue this and recheck BMET today. His lungs are clear but he has significant LL edema, which he will always have and hopefully we can keep it from getting worse.  3) HTN: poor control.  Start lisinopril 10mg  qd. Therapeutic expectations and side effect profile of medication discussed today.  Patient's questions answered. Recheck BMET 1 wk--lab visit. Continue home bp monitoring--recheck o/v for this in 1 mo.  4) Right LL desquamation of hyperkeratotic skin: start urea 40% cream qd.  5) GERD: doing great on PPI qd.  6) Metastatic prostate cancer (lymph nodes and bones): Zytiga and lupron as per Dr. Alen Blew.  7) OSA, hx of CPAP use. Referred pt to pulm.  He may need repeat sleep study before he can be set up with new CPAP and supplies.  An After Visit Summary was printed and given to the patient.  FOLLOW UP: No follow-ups on file.  Signed:  Crissie Sickles, MD           12/02/2017

## 2017-12-02 NOTE — Patient Outreach (Signed)
[facility]) Care Management   12/02/2017  Scott Rollins [birth year] [id number]  Scott Rollins is an 67 y.o. male  Subjective: client reports his main problem is weakness in his legs.   Objective: BP 128/70 (BP Location: Left Arm, Patient Position: Sitting, Cuff Size: Normal)   Pulse 61   SpO2 90%    Review of Systems  Respiratory: Negative.        Lungs Clear  Cardiovascular:       S1S2 noted. Regular Rhythm.    Physical Exam  Encounter Medications:   Outpatient Encounter Medications as of 12/02/2017  Medication Sig Note  . abiraterone acetate (ZYTIGA) 250 MG tablet Take 4 tablets (1,000 mg total) by mouth daily. Take on an empty stomach 1 hour before or 2 hours after a meal   . albuterol (PROAIR HFA) 108 (90 Base) MCG/ACT inhaler Inhale 2 puffs into the lungs every 6 (six) hours as needed for wheezing or shortness of breath.   Marland Kitchen albuterol (PROVENTIL) (2.5 MG/3ML) 0.083% nebulizer solution Take 3 mLs (2.5 mg total) by nebulization every 4 (four) hours as needed for wheezing or shortness of breath.   . allopurinol (ZYLOPRIM) 300 MG tablet Take 1 tablet (300 mg total) by mouth daily.   Marland Kitchen apixaban (ELIQUIS) 5 MG TABS tablet Take 1 tablet (5 mg total) by mouth 2 (two) times daily. 12/01/2017: Out of due to cost  . aspirin 325 MG EC tablet Take 325 mg by mouth daily.   . bisoprolol (ZEBETA) 5 MG tablet Take 1 tablet (5 mg total) by mouth daily.   . budesonide-formoterol (SYMBICORT) 160-4.5 MCG/ACT inhaler Inhale 2 puffs into the lungs 2 (two) times daily. 12/01/2017: Out of due to cost   . citalopram (CELEXA) 40 MG tablet Take 1 tablet (40 mg total) by mouth at bedtime.   Marland Kitchen dextromethorphan-guaiFENesin (MUCINEX DM) 30-600 MG 12hr tablet Take 1 tablet by mouth 2 (two) times daily as needed for cough. 12/01/2017: PRN only  . diltiazem (CARDIZEM) 30 MG tablet Take 1 tablet (30 mg total) by mouth every 12 (twelve) hours.   . feeding supplement, ENSURE ENLIVE,  (ENSURE ENLIVE) LIQD Take 237 mLs by mouth 2 (two) times daily between meals. (Patient taking differently: Take 237 mLs by mouth. 237 mls --Three to four times per day.)   . furosemide (LASIX) 40 MG tablet Take 1 tablet (40 mg total) by mouth 2 (two) times daily.   Marland Kitchen gabapentin (NEURONTIN) 300 MG capsule Take 1 capsule (300 mg total) by mouth 2 (two) times daily.   . Oxycodone HCl 10 MG TABS Take 1 tablet (10 mg total) by mouth every 8 (eight) hours as needed.   . pantoprazole (PROTONIX) 40 MG tablet Take 1 tablet (40 mg total) by mouth daily.   . potassium chloride SA (K-DUR,KLOR-CON) 20 MEQ tablet Take 1 tablet (20 mEq total) by mouth daily. (Patient not taking: Reported on 12/01/2017)   . predniSONE (DELTASONE) 5 MG tablet TAKE 1 TABLET BY MOUTH EVERY DAY WITH BREAKFAST starting from 11/28/17 (Patient not taking: Reported on 12/01/2017)   . Probiotic Product (PROBIOTIC PO) Take 1 capsule by mouth daily.   . prochlorperazine (COMPAZINE) 10 MG tablet Take 1 tablet (10 mg total) by mouth every 6 (six) hours as needed for nausea or vomiting.   . tamsulosin (FLOMAX) 0.4 MG CAPS capsule Take 1 capsule (0.4 mg total) by mouth daily after supper.   . traMADol (ULTRAM) 50 MG tablet Take 1 tablet (50 mg  total) by mouth every 6 (six) hours as needed.    No facility-administered encounter medications on file as of 12/02/2017.     Functional Status:   In your present state of health, do you have any difficulty performing the following activities: 11/16/2017 05/05/2017  Hearing? N Y  Comment - some hearing loss in right ear  Vision? N Y  Comment - waers glasses  Difficulty concentrating or making decisions? N N  Walking or climbing stairs? Y Y  Comment - -  Dressing or bathing? Y N  Doing errands, shopping? N Y  Comment - he states he ha Technical brewer and eating ? - N  Using the Toilet? - N  In the past six months, have you accidently leaked urine? - N  Do you have problems with loss of  bowel control? - N  Managing your Medications? - N  Managing your Finances? - N  Housekeeping or managing your Housekeeping? - N  Some recent data might be hidden    Fall/Depression Screening:    Fall Risk  10/12/2017 09/10/2017 08/10/2017  Falls in the past year? Yes No No  Comment - - -  Number falls in past yr: 2 or more - -  Injury with Fall? Yes - -  Risk Factor Category  High Fall Risk - -  Risk for fall due to : Impaired balance/gait - -  Risk for fall due to: Comment - - -  Follow up Falls evaluation completed;Education provided - -   PHQ 2/9 Scores 05/05/2017 04/16/2017 02/12/2017 11/13/2015  PHQ - 2 Score 2 2 0 3  PHQ- 9 Score 7 5 - 6    Assessment:  Referral received 11/20/17. 67 year old with history of HTN, COPD, heart failure, prostate cancer with metastasis to the bone. Recently admitted for COPD/pneumonia.  RNCM completed home visit. Present during visit was Scott Rollins (orienting RNCM).   Client reports his primary concern today was the weakness in his legs. He states he has an appointment with his primary care provider today and he is going to ask about it. RNCM encouraged client to discuss if he is a candidate for cardio-pulmonary rehab or another type of physical therapy type program. Client also with questions about Prednisone and states he is going to ask primary care about Prednisone dosing.  Medications reviewed. Client is being followed by [facility] Pharmacist Scott Rollins for medication assistance. RNCM delivered medication assistance packet to client from Pharmacy staff. Client instructed to follow up with Pharmacy staff as directed.  Plan: RNCM will follow up with client next month. [facility] CM Care Plan Problem One     Most Recent Value  Care Plan Problem One  at risk for readmission/recent admission for copd/pneumonia  Role Documenting the Problem One  Care Management [place] for Problem One  Active  [facility] Long Term Goal   client will not be readmitted within  the next 31 days.  [facility] Long Term Goal Start Date  11/20/17  Interventions for Problem One Long Term Goal  provided [facility] calendar/organizer and explained how to use,  reviewed action plan for copd  [facility] CM Short Term Goal #1   client will attend follow up appointment as needed.  [facility] CM Short Term Goal #1 Start Date  11/20/17  Interventions for Short Term Goal #1  RNCM  confirmed that client has transportation to his appointment, [facility] assisted client with writing down questions/topics he wanted to discuss with provider.  [facility] CM Short Term Goal #2   client will take medications as prescribed.  [facility] CM Short Term Goal #2 Start Date  11/20/17  Interventions for Short Term Goal #2  RNCM reviewed medications with client, care coordination with [facility] pharmacy staff.      Scott Silversmith, RN, MSN, [place] Coordinator Cell: [phone]

## 2017-12-03 LAB — BASIC METABOLIC PANEL
BUN: 17 mg/dL (ref 6–23)
CHLORIDE: 98 meq/L (ref 96–112)
CO2: 36 meq/L — AB (ref 19–32)
CREATININE: 0.84 mg/dL (ref 0.40–1.50)
Calcium: 8.9 mg/dL (ref 8.4–10.5)
GFR: 96.53 mL/min (ref >60.00)
GLUCOSE: 142 mg/dL — AB (ref 70–99)
Potassium: 4.2 mEq/L (ref 3.5–5.1)
Sodium: 141 mEq/L (ref 135–145)

## 2017-12-04 ENCOUNTER — Inpatient Hospital Stay: Payer: Medicare Other | Admitting: Family Medicine

## 2017-12-08 ENCOUNTER — Other Ambulatory Visit: Payer: Self-pay | Admitting: Pharmacy Technician

## 2017-12-08 ENCOUNTER — Encounter (HOSPITAL_COMMUNITY): Admission: RE | Admit: 2017-12-08 | Payer: Medicare Other | Source: Ambulatory Visit

## 2017-12-08 ENCOUNTER — Ambulatory Visit (HOSPITAL_COMMUNITY): Admission: RE | Admit: 2017-12-08 | Payer: Medicare Other | Source: Ambulatory Visit

## 2017-12-08 ENCOUNTER — Telehealth: Payer: Self-pay | Admitting: *Deleted

## 2017-12-08 ENCOUNTER — Other Ambulatory Visit: Payer: Medicare Other

## 2017-12-08 ENCOUNTER — Encounter (HOSPITAL_COMMUNITY): Payer: Medicare Other

## 2017-12-08 NOTE — Telephone Encounter (Signed)
Received voice mail message from wife stating,"patient isn't up to coming in today. Please cancel all appointments for today. My return number is [phone]."

## 2017-12-08 NOTE — Patient Outreach (Signed)
[place] [facility]) Care Management  12/08/2017  DELDRICK LINCH [birth year] [id number]  Successful outreach call, HIPAA identifiers verified. Patents states he had no issues filling out patient assistance applications that were given to him by [facility] CM RN [name]. Patient stated that he mailed them back in to me on Friday.  Will follow up with patient in the next 2-3 weeks with updates from patient assistance companies.  Maud Deed [place], [place] Management [phone]

## 2017-12-09 ENCOUNTER — Other Ambulatory Visit: Payer: Medicare Other

## 2017-12-10 ENCOUNTER — Ambulatory Visit: Payer: Medicare Other | Admitting: Oncology

## 2017-12-10 ENCOUNTER — Ambulatory Visit: Payer: Medicare Other | Admitting: Physician Assistant

## 2017-12-10 ENCOUNTER — Telehealth: Payer: Self-pay | Admitting: *Deleted

## 2017-12-10 NOTE — Telephone Encounter (Signed)
Called wife and scheduled an appointment for labs and flush prior to Imaging studies on 12/18/17. Per her request, she needs labs and flush to be on 12/16/17 at 3:00 pm. LOS sent to scheduling.

## 2017-12-11 ENCOUNTER — Ambulatory Visit: Payer: Self-pay | Admitting: Pharmacist

## 2017-12-11 ENCOUNTER — Other Ambulatory Visit: Payer: Self-pay | Admitting: Pharmacy Technician

## 2017-12-11 ENCOUNTER — Other Ambulatory Visit: Payer: Self-pay

## 2017-12-11 NOTE — Patient Outreach (Signed)
[place] [facility]) Care Management  12/11/2017  Scott Rollins 29-Apr-1951 [id number]   Faxed completed [employer] and [employer] application to companies on 05/30. Faxed completed [employer] application to company on 05/31.  Will follow up companies in the next 5-7 business days.  Maud Deed [place], [place] Management [phone]

## 2017-12-11 NOTE — Patient Outreach (Signed)
[place] [facility]) Care Management  12/11/2017  Scott Rollins 09/15/1950 [id number]   Per chart review the patient was discharged from the hospital on 11/19/2017 for Acute hypercapnic respiratory failure ([place]). The patient was referred to community.  Plan: RN Health will close the case to diease management.  Lazaro Arms RN, BSN, [place] Direct Dial:  [phone]  Fax: [phone]

## 2017-12-15 ENCOUNTER — Other Ambulatory Visit: Payer: Self-pay

## 2017-12-15 DIAGNOSIS — W19XXXA Unspecified fall, initial encounter: Secondary | ICD-10-CM | POA: Diagnosis not present

## 2017-12-15 DIAGNOSIS — I10 Essential (primary) hypertension: Secondary | ICD-10-CM | POA: Diagnosis not present

## 2017-12-15 DIAGNOSIS — R0902 Hypoxemia: Secondary | ICD-10-CM | POA: Diagnosis not present

## 2017-12-15 DIAGNOSIS — R0602 Shortness of breath: Secondary | ICD-10-CM | POA: Diagnosis not present

## 2017-12-15 NOTE — Patient Outreach (Signed)
[facility]) Care Management  12/15/2017  Scott Rollins 1950/08/18 [id number]   RNCM called to follow up and schedule home visit. No answer. HIPPA compliant message left.  Plan: await return call and continue to follow.  [name], RN, MSN, [place] Coordinator Cell: [phone]

## 2017-12-15 NOTE — Patient Outreach (Signed)
[facility]) Care Management  12/15/2017  Scott Rollins 10-Oct-1950 [id number]   Assessment: Referral received 11/20/17. 67 year old with history of HTN, COPD, heart failure, prostate cancer with metastasis to the bone. Recently admitted for COPD/pneumonia.  RNCM called to follow up and schedule home visit. Client reports he was doing well until about an hour ago when he started having trouble breathing. He states he called 911 and they increased his oxygen and is feeling better. He reports his oxygen is back down now to 2l/[place].   He states in addition, he reports his wife stepped in an area of his home where the floor was weak fell and "broke her wrist". [name] reports she was just taken to the emergency room. No shortness of breath noted during conversation. Client states he is doing better and has turned his oxygen back down to 2l/[place]. Client states, "things have been a little hectic around hear".  Client reports he is being referred to pulmonologist, but he has not heard from any one. RNCM offered to call primary care office to follow up on referral.  RNCM called primary care office to follow up on referral and spoke with office staff, Juliann Pulse.   Plan: await return call for clarification of referrals: pulmonary rehabilitation and sleep apnea/CPAP needs;  follow up with client. Plan to schedule home visit at next encounter.  [name], RN, MSN, [place] Coordinator Cell: [phone]

## 2017-12-16 ENCOUNTER — Ambulatory Visit: Payer: Self-pay | Admitting: Pharmacist

## 2017-12-16 ENCOUNTER — Inpatient Hospital Stay: Payer: Medicare Other

## 2017-12-16 ENCOUNTER — Telehealth: Payer: Self-pay

## 2017-12-16 ENCOUNTER — Inpatient Hospital Stay: Payer: Medicare Other | Attending: Oncology

## 2017-12-16 DIAGNOSIS — C7951 Secondary malignant neoplasm of bone: Secondary | ICD-10-CM | POA: Insufficient documentation

## 2017-12-16 DIAGNOSIS — J449 Chronic obstructive pulmonary disease, unspecified: Secondary | ICD-10-CM

## 2017-12-16 DIAGNOSIS — Z95828 Presence of other vascular implants and grafts: Secondary | ICD-10-CM | POA: Insufficient documentation

## 2017-12-16 DIAGNOSIS — C61 Malignant neoplasm of prostate: Secondary | ICD-10-CM | POA: Diagnosis not present

## 2017-12-16 LAB — CBC WITH DIFFERENTIAL (CANCER CENTER ONLY)
Basophils Absolute: 0 K/uL (ref 0.0–0.1)
Basophils Relative: 0 %
Eosinophils Absolute: 0.3 K/uL (ref 0.0–0.5)
Eosinophils Relative: 4 %
HCT: 42.7 % (ref 38.4–49.9)
Hemoglobin: 13 g/dL (ref 13.0–17.1)
Lymphocytes Relative: 17 %
Lymphs Abs: 1.2 K/uL (ref 0.9–3.3)
MCH: 28.2 pg (ref 27.2–33.4)
MCHC: 30.4 g/dL — ABNORMAL LOW (ref 32.0–36.0)
MCV: 92.6 fL (ref 79.3–98.0)
Monocytes Absolute: 0.5 K/uL (ref 0.1–0.9)
Monocytes Relative: 7 %
Neutro Abs: 5.1 K/uL (ref 1.5–6.5)
Neutrophils Relative %: 72 %
Platelet Count: 211 K/uL (ref 140–400)
RBC: 4.61 MIL/uL (ref 4.20–5.82)
RDW: 13.8 % (ref 11.0–14.6)
WBC Count: 7 K/uL (ref 4.0–10.3)

## 2017-12-16 LAB — CMP (CANCER CENTER ONLY)
ALBUMIN: 3.3 g/dL — AB (ref 3.5–5.0)
ALT: 12 U/L (ref 0–55)
AST: 18 U/L (ref 5–34)
Alkaline Phosphatase: 112 U/L (ref 40–150)
Anion gap: 10 (ref 3–11)
BILIRUBIN TOTAL: 0.5 mg/dL (ref 0.2–1.2)
BUN: 12 mg/dL (ref 7–26)
CHLORIDE: 95 mmol/L — AB (ref 98–109)
CO2: 37 mmol/L — AB (ref 22–29)
Calcium: 9.5 mg/dL (ref 8.4–10.4)
Creatinine: 0.8 mg/dL (ref 0.70–1.30)
GFR, Est AFR Am: >60 mL/min (ref >60)
GFR, Estimated: >60 mL/min (ref >60)
GLUCOSE: 120 mg/dL (ref 70–140)
POTASSIUM: 3.6 mmol/L (ref 3.5–5.1)
SODIUM: 142 mmol/L (ref 136–145)
TOTAL PROTEIN: 6.6 g/dL (ref 6.4–8.3)

## 2017-12-16 MED ORDER — HEPARIN SOD (PORK) LOCK FLUSH 100 UNIT/ML IV SOLN
500.0000 [IU] | Freq: Once | INTRAVENOUS | Status: DC
  Filled 2017-12-16: qty 5

## 2017-12-16 MED ORDER — SODIUM CHLORIDE 0.9% FLUSH
10.0000 mL | Freq: Once | INTRAVENOUS | Status: DC
  Filled 2017-12-16: qty 10

## 2017-12-16 NOTE — Telephone Encounter (Signed)
SW patient's wife to advise patient has been scheduled with Dr. Halford Chessman 12/21/17 3:45 at [facility] Pulmonary.

## 2017-12-16 NOTE — Telephone Encounter (Signed)
Copied from [place]. Topic: Inquiry >> Dec 15, 2017  3:19 PM Scherrie Gerlach wrote: Reason for CRM: Denton Brick with [facility] called to say she just spoke with the pt (she had a follow up call scheduled today) and he had just had an episode today of SOB. Pt called 911.  EMS cranked his oxygen up, and pt told [place] he is doing better. Pt states he has not heard from pulmonary. Denton Brick was thinking a referral to Pulmonary Rehabilitation (COPD Diagnosis): pt should have had 2 referrals to pulmonary. (one is a sleep study) (Pt never got the message from pulmonary to schedule the sleep study) Denton Brick wants to know if Dr Anitra Lauth is going to manage pt's COPD. Denton Brick would like a call back for clarification.  Suzette Battiest states right after EMS left their home, pt 's wife fell and broke her wrist.  EMS had to turn right back around and come get her.  EMS took her to the hospital.

## 2017-12-16 NOTE — Telephone Encounter (Signed)
Thank you. Patient is scheduled for PFT 12/21/17.

## 2017-12-16 NOTE — Telephone Encounter (Signed)
PFTs ordered. Pulm rehab reordered with one dx.

## 2017-12-16 NOTE — Telephone Encounter (Signed)
Patient also has a referral for pulmonary rehabilitation. The following is a note from the pulmonary/cardiac rehab department that will set up this referral:  ----- Message ----- From: Wyatt Portela Sent: 12/08/2017   8:57 AM To: Rowe Pavy, RN  [place],  Will PCP office to request PFT if one has been done. Will have to call either way to request new order as their can only be one DX on order.   ----- Message ----- From: Rowe Pavy, RN Sent: 12/07/2017   3:16 AM To: Wyatt Portela  Pt referred to pulmonary rehab by family MD.  Diagnosis Copd Stage 4.  PFT? in epic.  I see only a spirometry from 2018. Cherre Huger, BSN Cardiac and Training and development officer

## 2017-12-16 NOTE — Telephone Encounter (Signed)
Looks like pulmonology called and sw pts wife, who was going to have pt call back to schedule apt. Will you get this set up for pt? Please. Thanks.

## 2017-12-17 ENCOUNTER — Other Ambulatory Visit: Payer: Self-pay | Admitting: Pharmacy Technician

## 2017-12-17 ENCOUNTER — Telehealth (HOSPITAL_COMMUNITY): Payer: Self-pay

## 2017-12-17 LAB — PROSTATE-SPECIFIC AG, SERUM (LABCORP): Prostate Specific Ag, Serum: 2.3 ng/mL (ref 0.0–4.0)

## 2017-12-17 NOTE — Telephone Encounter (Signed)
Referral received. Will contact patient to inform him that until PFT has been completed we can not move forward with scheduling. Passed to [employer].

## 2017-12-17 NOTE — Patient Outreach (Signed)
[place] [facility]) Care Management  12/17/2017  Scott Rollins March 19, 1951 [id number]   Follow up call to [employer] to check status of patients application for Symbicort. Nadra informed me that patient has been approved as of 6/6 until 12/17/2018.   Will update patient in the next 5 business days.  Maud Deed [place], [place] Management [phone]

## 2017-12-17 NOTE — Patient Outreach (Signed)
[facility]) Care Management  12/17/2017  OSMANY AZER [birth year] [id number]  Follow up call to [place] to check status of patients application on Eliquis. Spoke to Elbert who confirmed Mr. Daywalt has been approved as of 06/05 until 07/13/18. I also asked Tamika that because patient does not have prescription coverage, What is the earliest we can reapply for assistance. Tamika stated that in her opinion it would be good if application is submitted in [facility] December.  Will update patient on approval after following up with [employer] patient assistance in the next 5 business days.  Maud Deed [place], [place] Management [phone]

## 2017-12-18 ENCOUNTER — Encounter (HOSPITAL_COMMUNITY)
Admission: RE | Admit: 2017-12-18 | Discharge: 2017-12-18 | Disposition: A | Discharge status: Home / Self Care | Payer: Medicare Other | Source: Ambulatory Visit | Attending: Oncology | Admitting: Oncology

## 2017-12-18 ENCOUNTER — Ambulatory Visit (HOSPITAL_COMMUNITY)
Admission: RE | Admit: 2017-12-18 | Discharge: 2017-12-18 | Disposition: A | Discharge status: Home / Self Care | Payer: Medicare Other | Source: Ambulatory Visit | Attending: Oncology | Admitting: Oncology

## 2017-12-18 ENCOUNTER — Telehealth: Payer: Self-pay | Admitting: Family Medicine

## 2017-12-18 DIAGNOSIS — J810 Acute pulmonary edema: Secondary | ICD-10-CM | POA: Diagnosis not present

## 2017-12-18 DIAGNOSIS — J9602 Acute respiratory failure with hypercapnia: Secondary | ICD-10-CM | POA: Diagnosis not present

## 2017-12-18 DIAGNOSIS — C7951 Secondary malignant neoplasm of bone: Principal | ICD-10-CM

## 2017-12-18 DIAGNOSIS — R0602 Shortness of breath: Secondary | ICD-10-CM | POA: Diagnosis not present

## 2017-12-18 DIAGNOSIS — C61 Malignant neoplasm of prostate: Secondary | ICD-10-CM

## 2017-12-18 DIAGNOSIS — R918 Other nonspecific abnormal finding of lung field: Secondary | ICD-10-CM

## 2017-12-18 DIAGNOSIS — I700 Atherosclerosis of aorta: Secondary | ICD-10-CM | POA: Insufficient documentation

## 2017-12-18 DIAGNOSIS — J441 Chronic obstructive pulmonary disease with (acute) exacerbation: Secondary | ICD-10-CM | POA: Diagnosis not present

## 2017-12-18 DIAGNOSIS — J90 Pleural effusion, not elsewhere classified: Secondary | ICD-10-CM | POA: Insufficient documentation

## 2017-12-18 DIAGNOSIS — R590 Localized enlarged lymph nodes: Secondary | ICD-10-CM

## 2017-12-18 DIAGNOSIS — N329 Bladder disorder, unspecified: Secondary | ICD-10-CM

## 2017-12-18 MED ORDER — TECHNETIUM TC 99M MEDRONATE IV KIT
20.0000 | PACK | Freq: Once | INTRAVENOUS | Status: AC | PRN
  Administered 2017-12-18: 19.3 via INTRAVENOUS

## 2017-12-18 MED ORDER — IOPAMIDOL (ISOVUE-300) INJECTION 61%
100.0000 mL | Freq: Once | INTRAVENOUS | Status: AC | PRN
  Administered 2017-12-18: 100 mL via INTRAVENOUS

## 2017-12-18 MED ORDER — AZITHROMYCIN 250 MG PO TABS: ORAL_TABLET | ORAL | 0 refills | Status: DC

## 2017-12-18 MED ORDER — PREDNISONE 20 MG PO TABS: ORAL_TABLET | ORAL | 0 refills | Status: DC

## 2017-12-18 MED ORDER — IOPAMIDOL (ISOVUE-300) INJECTION 61%
INTRAVENOUS | Status: AC
  Filled 2017-12-18: qty 100

## 2017-12-18 NOTE — Telephone Encounter (Signed)
Copied from [place] [id number]. Topic: Quick Communication - See Telephone Encounter >> Dec 18, 2017 12:54 PM Bea Graff, NT wrote: CRM for notification. See Telephone encounter for: 12/18/17. Pts wife calling to see if soemthing for her husband can be called in for congestion in "bronchial tubes". She states it is hard to get him out of the house to come in for an appt. States he was seen with Dr. Anitra Lauth last week. [id number] [place], [place] (Phone) [phone] (Fax)

## 2017-12-18 NOTE — Telephone Encounter (Signed)
Rx's sent as directed.   Pts wife advised and voice understanding, okay per DPR.

## 2017-12-18 NOTE — Telephone Encounter (Signed)
Pls eRx prednisone 20mg , 2 tabs po qd x 5d, #10 no RF.  Also, azithromycin 250mg , 2 tabs po qd x 1d, then 1 tab po qd x 4d, #6, no RF. Advise pt to go to ED or call 911 if worsens over the weekend.   Needs to f/u in office next week if not much improved after these meds.-thx

## 2017-12-18 NOTE — Telephone Encounter (Signed)
Please advise. Thanks.  

## 2017-12-21 ENCOUNTER — Other Ambulatory Visit: Payer: Self-pay

## 2017-12-21 ENCOUNTER — Emergency Department (HOSPITAL_COMMUNITY): Payer: Medicare Other

## 2017-12-21 ENCOUNTER — Inpatient Hospital Stay (HOSPITAL_COMMUNITY): Admission: RE | Admit: 2017-12-21 | Payer: Medicare Other | Source: Ambulatory Visit

## 2017-12-21 ENCOUNTER — Encounter (HOSPITAL_COMMUNITY): Payer: Self-pay | Admitting: Oncology

## 2017-12-21 ENCOUNTER — Institutional Professional Consult (permissible substitution): Payer: Medicare Other | Admitting: Pulmonary Disease

## 2017-12-21 ENCOUNTER — Inpatient Hospital Stay (HOSPITAL_COMMUNITY)
Admission: EM | Admit: 2017-12-21 | Discharge: 2017-12-27 | DRG: 190 | Disposition: A | Discharge status: Home / Self Care | Payer: Medicare Other | Attending: Internal Medicine | Admitting: Internal Medicine

## 2017-12-21 DIAGNOSIS — Z720 Tobacco use: Secondary | ICD-10-CM | POA: Diagnosis not present

## 2017-12-21 DIAGNOSIS — J810 Acute pulmonary edema: Secondary | ICD-10-CM | POA: Diagnosis not present

## 2017-12-21 DIAGNOSIS — E662 Morbid (severe) obesity with alveolar hypoventilation: Secondary | ICD-10-CM | POA: Diagnosis not present

## 2017-12-21 DIAGNOSIS — Z86010 Personal history of colonic polyps: Secondary | ICD-10-CM | POA: Diagnosis not present

## 2017-12-21 DIAGNOSIS — I5032 Chronic diastolic (congestive) heart failure: Secondary | ICD-10-CM | POA: Diagnosis not present

## 2017-12-21 DIAGNOSIS — M109 Gout, unspecified: Secondary | ICD-10-CM | POA: Diagnosis present

## 2017-12-21 DIAGNOSIS — Z66 Do not resuscitate: Secondary | ICD-10-CM | POA: Diagnosis present

## 2017-12-21 DIAGNOSIS — I482 Chronic atrial fibrillation: Secondary | ICD-10-CM | POA: Diagnosis not present

## 2017-12-21 DIAGNOSIS — G894 Chronic pain syndrome: Secondary | ICD-10-CM | POA: Diagnosis present

## 2017-12-21 DIAGNOSIS — Z6836 Body mass index (BMI) 36.0-36.9, adult: Secondary | ICD-10-CM | POA: Diagnosis not present

## 2017-12-21 DIAGNOSIS — J9621 Acute and chronic respiratory failure with hypoxia: Secondary | ICD-10-CM | POA: Diagnosis not present

## 2017-12-21 DIAGNOSIS — I252 Old myocardial infarction: Secondary | ICD-10-CM

## 2017-12-21 DIAGNOSIS — Z7952 Long term (current) use of systemic steroids: Secondary | ICD-10-CM

## 2017-12-21 DIAGNOSIS — R0902 Hypoxemia: Secondary | ICD-10-CM | POA: Diagnosis not present

## 2017-12-21 DIAGNOSIS — G40909 Epilepsy, unspecified, not intractable, without status epilepticus: Secondary | ICD-10-CM | POA: Diagnosis present

## 2017-12-21 DIAGNOSIS — C7951 Secondary malignant neoplasm of bone: Secondary | ICD-10-CM | POA: Diagnosis not present

## 2017-12-21 DIAGNOSIS — Z7901 Long term (current) use of anticoagulants: Secondary | ICD-10-CM

## 2017-12-21 DIAGNOSIS — Z9221 Personal history of antineoplastic chemotherapy: Secondary | ICD-10-CM

## 2017-12-21 DIAGNOSIS — Z955 Presence of coronary angioplasty implant and graft: Secondary | ICD-10-CM | POA: Diagnosis not present

## 2017-12-21 DIAGNOSIS — Z79899 Other long term (current) drug therapy: Secondary | ICD-10-CM

## 2017-12-21 DIAGNOSIS — Z888 Allergy status to other drugs, medicaments and biological substances status: Secondary | ICD-10-CM

## 2017-12-21 DIAGNOSIS — I509 Heart failure, unspecified: Secondary | ICD-10-CM

## 2017-12-21 DIAGNOSIS — I4891 Unspecified atrial fibrillation: Secondary | ICD-10-CM | POA: Diagnosis present

## 2017-12-21 DIAGNOSIS — Z87891 Personal history of nicotine dependence: Secondary | ICD-10-CM

## 2017-12-21 DIAGNOSIS — R0602 Shortness of breath: Secondary | ICD-10-CM | POA: Diagnosis not present

## 2017-12-21 DIAGNOSIS — R61 Generalized hyperhidrosis: Secondary | ICD-10-CM | POA: Diagnosis not present

## 2017-12-21 DIAGNOSIS — C61 Malignant neoplasm of prostate: Secondary | ICD-10-CM | POA: Diagnosis present

## 2017-12-21 DIAGNOSIS — J9602 Acute respiratory failure with hypercapnia: Secondary | ICD-10-CM

## 2017-12-21 DIAGNOSIS — F329 Major depressive disorder, single episode, unspecified: Secondary | ICD-10-CM | POA: Diagnosis present

## 2017-12-21 DIAGNOSIS — Z923 Personal history of irradiation: Secondary | ICD-10-CM

## 2017-12-21 DIAGNOSIS — G4733 Obstructive sleep apnea (adult) (pediatric): Secondary | ICD-10-CM | POA: Diagnosis present

## 2017-12-21 DIAGNOSIS — E785 Hyperlipidemia, unspecified: Secondary | ICD-10-CM | POA: Diagnosis present

## 2017-12-21 DIAGNOSIS — R0689 Other abnormalities of breathing: Secondary | ICD-10-CM | POA: Diagnosis not present

## 2017-12-21 DIAGNOSIS — F419 Anxiety disorder, unspecified: Secondary | ICD-10-CM | POA: Diagnosis present

## 2017-12-21 DIAGNOSIS — G893 Neoplasm related pain (acute) (chronic): Secondary | ICD-10-CM | POA: Diagnosis present

## 2017-12-21 DIAGNOSIS — I110 Hypertensive heart disease with heart failure: Secondary | ICD-10-CM | POA: Diagnosis present

## 2017-12-21 DIAGNOSIS — J441 Chronic obstructive pulmonary disease with (acute) exacerbation: Principal | ICD-10-CM | POA: Diagnosis present

## 2017-12-21 DIAGNOSIS — F17200 Nicotine dependence, unspecified, uncomplicated: Secondary | ICD-10-CM | POA: Diagnosis not present

## 2017-12-21 DIAGNOSIS — J9622 Acute and chronic respiratory failure with hypercapnia: Secondary | ICD-10-CM | POA: Diagnosis not present

## 2017-12-21 DIAGNOSIS — I10 Essential (primary) hypertension: Secondary | ICD-10-CM | POA: Diagnosis not present

## 2017-12-21 DIAGNOSIS — F1290 Cannabis use, unspecified, uncomplicated: Secondary | ICD-10-CM | POA: Diagnosis present

## 2017-12-21 DIAGNOSIS — Z7189 Other specified counseling: Secondary | ICD-10-CM

## 2017-12-21 DIAGNOSIS — I2510 Atherosclerotic heart disease of native coronary artery without angina pectoris: Secondary | ICD-10-CM | POA: Diagnosis present

## 2017-12-21 DIAGNOSIS — Z515 Encounter for palliative care: Secondary | ICD-10-CM | POA: Diagnosis present

## 2017-12-21 DIAGNOSIS — Z8249 Family history of ischemic heart disease and other diseases of the circulatory system: Secondary | ICD-10-CM

## 2017-12-21 DIAGNOSIS — Z7982 Long term (current) use of aspirin: Secondary | ICD-10-CM

## 2017-12-21 DIAGNOSIS — E876 Hypokalemia: Secondary | ICD-10-CM | POA: Diagnosis not present

## 2017-12-21 DIAGNOSIS — Z9981 Dependence on supplemental oxygen: Secondary | ICD-10-CM

## 2017-12-21 LAB — BASIC METABOLIC PANEL
Anion gap: 8 (ref 5–15)
BUN: 14 mg/dL (ref 6–20)
CALCIUM: 8.6 mg/dL — AB (ref 8.9–10.3)
CO2: 41 mmol/L — AB (ref 22–32)
CREATININE: 0.76 mg/dL (ref 0.61–1.24)
Chloride: 94 mmol/L — ABNORMAL LOW (ref 101–111)
GFR calc Af Amer: >60 mL/min (ref >60)
GFR calc non Af Amer: >60 mL/min (ref >60)
Glucose, Bld: 132 mg/dL — ABNORMAL HIGH (ref 65–99)
Potassium: 4.7 mmol/L (ref 3.5–5.1)
Sodium: 143 mmol/L (ref 135–145)

## 2017-12-21 LAB — CBC WITH DIFFERENTIAL/PLATELET
BASOS PCT: 1 %
Basophils Absolute: 0.1 10*3/uL (ref 0.0–0.1)
EOS ABS: 0 10*3/uL (ref 0.0–0.7)
EOS PCT: 0 %
HEMATOCRIT: 46.7 % (ref 39.0–52.0)
Hemoglobin: 13.5 g/dL (ref 13.0–17.0)
LYMPHS PCT: 8 %
Lymphs Abs: 1 10*3/uL (ref 0.7–4.0)
MCH: 27.8 pg (ref 26.0–34.0)
MCHC: 28.9 g/dL — ABNORMAL LOW (ref 30.0–36.0)
MCV: 96.1 fL (ref 78.0–100.0)
MONO ABS: 0.7 10*3/uL (ref 0.1–1.0)
Monocytes Relative: 5 %
Neutro Abs: 11.2 10*3/uL — ABNORMAL HIGH (ref 1.7–7.7)
Neutrophils Relative %: 86 %
PLATELETS: 267 10*3/uL (ref 150–400)
RBC: 4.86 MIL/uL (ref 4.22–5.81)
RDW: 13.4 % (ref 11.5–15.5)
WBC: 13 10*3/uL — ABNORMAL HIGH (ref 4.0–10.5)

## 2017-12-21 LAB — BRAIN NATRIURETIC PEPTIDE: B Natriuretic Peptide: 300.3 pg/mL — ABNORMAL HIGH (ref 0.0–100.0)

## 2017-12-21 LAB — I-STAT VENOUS BLOOD GAS, ED
Acid-Base Excess: 18 mmol/L — ABNORMAL HIGH (ref 0.0–2.0)
Bicarbonate: 45.8 mmol/L — ABNORMAL HIGH (ref 20.0–28.0)
O2 SAT: 92 %
PCO2 VEN: 68.5 mmHg — AB (ref 44.0–60.0)
TCO2: 48 mmol/L — ABNORMAL HIGH (ref 22–32)
pH, Ven: 7.433 — ABNORMAL HIGH (ref 7.250–7.430)
pO2, Ven: 66 mmHg — ABNORMAL HIGH (ref 32.0–45.0)

## 2017-12-21 LAB — BLOOD GAS, ARTERIAL
ACID-BASE EXCESS: 14.9 mmol/L — AB (ref 0.0–2.0)
BICARBONATE: 43.2 mmol/L — AB (ref 20.0–28.0)
DELIVERY SYSTEMS: POSITIVE
DRAWN BY: 10006
EXPIRATORY PAP: 6
FIO2: 60
Inspiratory PAP: 18
Mode: POSITIVE
O2 SAT: 97 %
PATIENT TEMPERATURE: 98.6
PCO2 ART: 114 mmHg — AB (ref 32.0–48.0)
PH ART: 7.203 — AB (ref 7.350–7.450)
pO2, Arterial: 100 mmHg (ref 83.0–108.0)

## 2017-12-21 LAB — TROPONIN I: Troponin I: <0.03 ng/mL (ref <0.03)

## 2017-12-21 MED ORDER — NICOTINE 14 MG/24HR TD PT24
14.0000 mg | MEDICATED_PATCH | Freq: Every day | TRANSDERMAL | Status: DC
  Administered 2017-12-27: 14 mg via TRANSDERMAL
  Filled 2017-12-21 (×3): qty 1

## 2017-12-21 MED ORDER — PREDNISONE 20 MG PO TABS
40.0000 mg | ORAL_TABLET | Freq: Every day | ORAL | Status: DC
  Administered 2017-12-22 – 2017-12-24 (×3): 40 mg via ORAL
  Filled 2017-12-21 (×3): qty 2

## 2017-12-21 MED ORDER — ACETAMINOPHEN 650 MG RE SUPP: 650.0000 mg | Freq: Four times a day (QID) | RECTAL | Status: DC | PRN

## 2017-12-21 MED ORDER — ALBUTEROL (5 MG/ML) CONTINUOUS INHALATION SOLN
10.0000 mg/h | INHALATION_SOLUTION | Freq: Once | RESPIRATORY_TRACT | Status: AC
  Administered 2017-12-21: 10 mg/h via RESPIRATORY_TRACT
  Filled 2017-12-21: qty 20

## 2017-12-21 MED ORDER — ACETAMINOPHEN 325 MG PO TABS: 650.0000 mg | ORAL_TABLET | Freq: Four times a day (QID) | ORAL | Status: DC | PRN

## 2017-12-21 MED ORDER — FUROSEMIDE 10 MG/ML IJ SOLN
80.0000 mg | Freq: Once | INTRAMUSCULAR | Status: AC
Start: 2017-12-21 — End: 2017-12-21
  Administered 2017-12-21: 80 mg via INTRAVENOUS
  Filled 2017-12-21: qty 8

## 2017-12-21 MED ORDER — CITALOPRAM HYDROBROMIDE 20 MG PO TABS
40.0000 mg | ORAL_TABLET | Freq: Every day | ORAL | Status: DC
  Administered 2017-12-22 – 2017-12-26 (×5): 40 mg via ORAL
  Filled 2017-12-21 (×5): qty 2

## 2017-12-21 MED ORDER — BISOPROLOL FUMARATE 5 MG PO TABS
5.0000 mg | ORAL_TABLET | Freq: Every day | ORAL | Status: DC
  Administered 2017-12-21 – 2017-12-22 (×2): 5 mg via ORAL
  Filled 2017-12-21 (×2): qty 1

## 2017-12-21 MED ORDER — FUROSEMIDE 10 MG/ML IJ SOLN
80.0000 mg | Freq: Two times a day (BID) | INTRAMUSCULAR | Status: DC
  Administered 2017-12-21 – 2017-12-26 (×10): 80 mg via INTRAVENOUS
  Filled 2017-12-21 (×10): qty 8

## 2017-12-21 MED ORDER — LISINOPRIL 10 MG PO TABS
10.0000 mg | ORAL_TABLET | Freq: Every day | ORAL | Status: DC
  Administered 2017-12-21 – 2017-12-27 (×7): 10 mg via ORAL
  Filled 2017-12-21 (×7): qty 1

## 2017-12-21 MED ORDER — PROCHLORPERAZINE MALEATE 10 MG PO TABS
10.0000 mg | ORAL_TABLET | Freq: Four times a day (QID) | ORAL | Status: DC | PRN
Start: 2017-12-21 — End: 2017-12-27
  Filled 2017-12-21: qty 1

## 2017-12-21 MED ORDER — TRAMADOL HCL 50 MG PO TABS: 50.0000 mg | ORAL_TABLET | Freq: Four times a day (QID) | ORAL | Status: DC | PRN

## 2017-12-21 MED ORDER — ASPIRIN EC 325 MG PO TBEC
325.0000 mg | DELAYED_RELEASE_TABLET | Freq: Every day | ORAL | Status: DC
  Administered 2017-12-21 – 2017-12-22 (×2): 325 mg via ORAL
  Filled 2017-12-21 (×2): qty 1

## 2017-12-21 MED ORDER — ABIRATERONE ACETATE 250 MG PO TABS: 1000.0000 mg | ORAL_TABLET | Freq: Every day | ORAL | Status: DC

## 2017-12-21 MED ORDER — DM-GUAIFENESIN ER 30-600 MG PO TB12: 1.0000 | ORAL_TABLET | Freq: Two times a day (BID) | ORAL | Status: DC | PRN

## 2017-12-21 MED ORDER — TAMSULOSIN HCL 0.4 MG PO CAPS
0.4000 mg | ORAL_CAPSULE | Freq: Every day | ORAL | Status: DC
  Administered 2017-12-22 – 2017-12-26 (×5): 0.4 mg via ORAL
  Filled 2017-12-21 (×5): qty 1

## 2017-12-21 MED ORDER — PANTOPRAZOLE SODIUM 40 MG PO TBEC
40.0000 mg | DELAYED_RELEASE_TABLET | Freq: Every day | ORAL | Status: DC
  Administered 2017-12-21 – 2017-12-27 (×7): 40 mg via ORAL
  Filled 2017-12-21 (×7): qty 1

## 2017-12-21 MED ORDER — GABAPENTIN 300 MG PO CAPS
300.0000 mg | ORAL_CAPSULE | Freq: Every day | ORAL | Status: DC
  Administered 2017-12-22 – 2017-12-26 (×5): 300 mg via ORAL
  Filled 2017-12-21 (×5): qty 1

## 2017-12-21 MED ORDER — NITROGLYCERIN 2 % TD OINT
1.0000 [in_us] | TOPICAL_OINTMENT | Freq: Once | TRANSDERMAL | Status: AC
  Administered 2017-12-21: 1 [in_us] via TOPICAL
  Filled 2017-12-21: qty 1

## 2017-12-21 MED ORDER — DILTIAZEM HCL 60 MG PO TABS
30.0000 mg | ORAL_TABLET | Freq: Two times a day (BID) | ORAL | Status: DC
  Administered 2017-12-21 – 2017-12-22 (×2): 30 mg via ORAL
  Filled 2017-12-21 (×2): qty 1

## 2017-12-21 MED ORDER — OXYCODONE HCL 5 MG PO TABS
10.0000 mg | ORAL_TABLET | Freq: Three times a day (TID) | ORAL | Status: DC | PRN
  Administered 2017-12-22 (×2): 10 mg via ORAL
  Filled 2017-12-21 (×2): qty 2

## 2017-12-21 MED ORDER — ALBUTEROL SULFATE (2.5 MG/3ML) 0.083% IN NEBU
2.5000 mg | INHALATION_SOLUTION | RESPIRATORY_TRACT | Status: DC | PRN
Start: 2017-12-21 — End: 2017-12-27

## 2017-12-21 MED ORDER — ONDANSETRON HCL 4 MG/2ML IJ SOLN
4.0000 mg | Freq: Four times a day (QID) | INTRAMUSCULAR | Status: DC | PRN
  Filled 2017-12-21: qty 2

## 2017-12-21 MED ORDER — DOCUSATE SODIUM 100 MG PO CAPS
100.0000 mg | ORAL_CAPSULE | Freq: Two times a day (BID) | ORAL | Status: DC
  Administered 2017-12-21 – 2017-12-27 (×11): 100 mg via ORAL
  Filled 2017-12-21 (×12): qty 1

## 2017-12-21 MED ORDER — OXYCODONE HCL 10 MG PO TABS: 10.0000 mg | ORAL_TABLET | Freq: Three times a day (TID) | ORAL | Status: DC | PRN

## 2017-12-21 MED ORDER — APIXABAN 5 MG PO TABS
5.0000 mg | ORAL_TABLET | Freq: Two times a day (BID) | ORAL | Status: DC
  Administered 2017-12-21 – 2017-12-27 (×12): 5 mg via ORAL
  Filled 2017-12-21 (×14): qty 1

## 2017-12-21 MED ORDER — ONDANSETRON HCL 4 MG PO TABS: 4.0000 mg | ORAL_TABLET | Freq: Four times a day (QID) | ORAL | Status: DC | PRN

## 2017-12-21 MED ORDER — SODIUM CHLORIDE 0.9 % IV SOLN
100.0000 mg | Freq: Two times a day (BID) | INTRAVENOUS | Status: AC
  Administered 2017-12-21 – 2017-12-22 (×3): 100 mg via INTRAVENOUS
  Filled 2017-12-21 (×5): qty 100

## 2017-12-21 MED ORDER — HYDRALAZINE HCL 20 MG/ML IJ SOLN
5.0000 mg | INTRAMUSCULAR | Status: DC | PRN
  Administered 2017-12-21 – 2017-12-22 (×3): 5 mg via INTRAVENOUS
  Filled 2017-12-21 (×3): qty 1

## 2017-12-21 MED ORDER — SODIUM CHLORIDE 0.9% FLUSH
3.0000 mL | Freq: Two times a day (BID) | INTRAVENOUS | Status: DC
  Administered 2017-12-21 – 2017-12-27 (×12): 3 mL via INTRAVENOUS

## 2017-12-21 MED ORDER — METHYLPREDNISOLONE SODIUM SUCC 125 MG IJ SOLR
60.0000 mg | Freq: Two times a day (BID) | INTRAMUSCULAR | Status: AC
  Administered 2017-12-21 – 2017-12-22 (×2): 60 mg via INTRAVENOUS
  Filled 2017-12-21 (×2): qty 2

## 2017-12-21 MED ORDER — ENSURE ENLIVE PO LIQD
237.0000 mL | Freq: Three times a day (TID) | ORAL | Status: DC
  Administered 2017-12-22 – 2017-12-27 (×12): 237 mL via ORAL
  Filled 2017-12-21 (×3): qty 237

## 2017-12-21 MED ORDER — IPRATROPIUM-ALBUTEROL 0.5-2.5 (3) MG/3ML IN SOLN
3.0000 mL | Freq: Four times a day (QID) | RESPIRATORY_TRACT | Status: DC
  Administered 2017-12-21 – 2017-12-22 (×6): 3 mL via RESPIRATORY_TRACT
  Filled 2017-12-21 (×6): qty 3

## 2017-12-21 NOTE — ED Triage Notes (Addendum)
Pt bib [facility] from home d/t respiratory distress.  Per EMS pt woke up in respiratory distress.  Upon their arrival pt's O2 sat noted to be 77% on RA.  EMS placed pt on CPAP w/ improvement in O2 sat to 94%.  Pt's breathing is labored accessory muscle use, tachypnea.  Pt given 10 mg albuterol and 10 mg atrovent en route.

## 2017-12-21 NOTE — ED Provider Notes (Signed)
[place] EMERGENCY DEPARTMENT Provider Note   CSN: [id number] Arrival date & time: 12/21/17  1601     History   Chief Complaint Chief Complaint  Patient presents with  . Respiratory Distress   Level 5 caveat due to acuity of condition HPI Scott Rollins is a 67 y.o. male.  The history is provided by the patient and the EMS personnel. The history is limited by the condition of the patient.  Shortness of Breath  This is a new problem. The problem occurs frequently.The problem has been rapidly worsening.   Patient with extensive medical conditions including COPD, CAD, prostate cancer presents with respiratory distress.  Per EMS, patient woke up short of breath and 911 was called.  On arrival his pulse ox was 77% on room air.  He was placed on CPAP with some improvement in his oxygenation  his work of breathing also improved No other details are known at this time Past Medical History:  Diagnosis Date  . Anginal pain ([place])   . Anxiety   . Arthritis    "hips, knees, thoracic back" (01/22/2017)  . CAP (community acquired pneumonia) 11/2016  . 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 COPD (chronic obstructive pulmonary disease) ([place])    "pearl study" pt thinks he is on Symbicort; pt noncompliant with COPD controller meds on/off due to financial reasons.  . Coronary atherosclerosis of unspecified type of vessel, native or graft 2005   MI, s/p PCI with stent (pt reports 20+ interventions in the past, most recent cat 6/08 showed patent stents).  Normal LV function.  Nuclear stress test NEG 8/09.  . Depression    ?bipolar dx by psychiatrist?  . Diverticulosis 2006  . ED (erectile dysfunction)   . Gout   . Gouty arthritis    Dr. Amil Amen  . Heart murmur   . Hematochezia 09/2012   Hyperplastic polyp, diverticulosis, and internal hemorrhoids found on colonoscopy 10/2012  . History of hiatal hernia   . Hyperkalemia 01/29/2017     Kayexalate 30 given x 1.  . Hyperlipidemia    Hx of multiple statin intolerance  . Hypertension   . 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 Migraine    "none in the 2000s" (01/15/2017)  . Mitral valve prolapse ~ 1981  . Myocardial infarction [facility]) ~ 1993- 2005 X 3-4   (01/15/2017)  . Neuropathic pain of both feet 2015/2016   Vit B12 and A1c normal 10/2014  . Obesities, morbid ([place])   . On home oxygen therapy    "2L prn" (01/15/2017)  . OSA on CPAP    w/oxygen (01/15/2017)  . Osteoarthrosis, unspecified whether generalized or localized, unspecified site    DDD  . PAF (paroxysmal atrial fibrillation) ([place]) 12/2016   Started in the context of resp illness/lots of bronchodilators.  Started eliquis and cardizem during hospitalization 01/2017 (Dr. Rayann Heman).  . Pre-diabetes 2016/17  . Prostate cancer metastatic to multiple sites [facility]) 09/2010; 2018   2012: Localized, high risk disease (Dr. Kimbrough/[place]):  s/p 5 wks ext bm rad + seed boost, as well as hormone blockade x 1 yr.  Biochemical recurrence 11/2015;  2018 imaging showed bone mets and lymph node involvement.  Palliative Lupron and zytiga 2018/19--good PSA response as of 09/2017 onc f/u.  Marland Kitchen Radiation    Pelvic--for prostate  ca: 25 treatments/01/2011  . Seizure disorder ([place]) 08-15-2011   Deja vu sensations: no w/u.  These stopped when neurontin 300mg  tid was started for a different reason.  . Status post chemotherapy    10/2010 thru 06/2011  . Tobacco dependence    still smoking as of fall 2018.    Patient Active Problem List   Diagnosis Date Noted  . Port-A-Cath in place 12/16/2017  . Acute exacerbation of chronic obstructive pulmonary disease (COPD) ([place]) 11/16/2017  . Acute hypercapnic respiratory failure ([place]) 11/16/2017  . Prostate cancer metastatic to bone ([place])   . Acute on chronic respiratory failure ([place]) 01/21/2017   . Acute kidney injury ([place]) 01/21/2017  . Abnormal CT of the chest 01/21/2017  . Obesity hypoventilation syndrome ([place])   . SOB (shortness of breath) 01/16/2017  . Atrial fibrillation ([place]) 01/15/2017  . Community acquired pneumonia of right lower lobe of lung ([place]) 11/05/2016  . Hyponatremia 11/05/2016  . OSA (obstructive sleep apnea) 11/05/2016  . COPD with acute exacerbation ([place]) 11/04/2016  . 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 COPD (chronic obstructive pulmonary disease) ([place]) 04/19/2014  . Chronic pain syndrome 01/11/2014  . Chronic gout 04/22/2013  . Health maintenance examination 04/29/2012  . Prostate cancer ([place]) 09/12/2010  . MIXED HYPERLIPIDEMIA 08/27/2010  . ELEVATED PROSTATE SPECIFIC ANTIGEN 08/26/2010  . 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 HEMATURIA, HX OF 08/21/2010  . TOBACCO ABUSE 07/11/2010  . NUMMULAR ECZEMA 06/24/2010  . EMPHYSEMA 04/16/2009  . DYSPNEA 04/10/2009  . OBESITY 04/09/2009  . Essential hypertension 04/09/2009  . Coronary artery disease due to lipid rich plaque 04/09/2009  . DEGENERATIVE JOINT DISEASE 04/09/2009    Past Surgical History:  Procedure Laterality Date  . CARDIAC CATHETERIZATION  23 caths  . COLONOSCOPY WITH PROPOFOL N/A 04/05/2013   Dr. Fuller Plan.  Polypectomy (hyperplastic--recall 10 yrs).  Moderate diverticulosis, +internal hemorrhoids.  No radiation proctitis.  . CORONARY ANGIOPLASTY    . CORONARY ANGIOPLASTY WITH STENT PLACEMENT     "5-6 stents" (01/15/2017)  . HERNIA REPAIR    . HOT HEMOSTASIS N/A 04/05/2013   Procedure: HOT HEMOSTASIS (ARGON PLASMA COAGULATION/BICAP);  Surgeon: Ladene Artist, MD;  Location: [facility];  Service: Endoscopy;  Laterality: N/A;  . INSERTION PROSTATE RADIATION SEED  02/24/2011  . TRANSTHORACIC ECHOCARDIOGRAM  12/2016   2018: EF 55-60%, moderate LVH, grd I DD, mild LA dilation.  11/17/17: LVEF of 55 to 13%, grade 1 diastolic dysfunction no wall motion abnormalities.  Marland Kitchen UMBILICAL HERNIA REPAIR           Home Medications    Prior to Admission medications   Medication Sig Start Date End Date Taking? Authorizing Provider  abiraterone acetate (ZYTIGA) 250 MG tablet Take 4 tablets (1,000 mg total) by mouth daily. Take on an empty stomach 1 hour before or 2 hours after a meal 11/13/17   Wyatt Portela, MD  albuterol (PROAIR HFA) 108 (90 Base) MCG/ACT inhaler Inhale 2 puffs into the lungs every 6 (six) hours as needed for wheezing or shortness of breath. 11/04/17   [name], [name], MD  albuterol (PROVENTIL) (2.5 MG/3ML) 0.083% nebulizer solution Take 3 mLs (2.5 mg total) by nebulization every 4 (four) hours as needed for wheezing or shortness of breath. 12/02/17   [name], [name], MD  allopurinol (ZYLOPRIM) 300 MG tablet Take 1 tablet (300 mg total) by mouth daily. 12/17/16   [name], [name], MD  apixaban (ELIQUIS) 5 MG TABS tablet Take 1 tablet (5 mg total) by mouth  2 (two) times daily. Patient not taking: Reported on 12/02/2017 09/28/17   [name], MD  aspirin 325 MG EC tablet Take 325 mg by mouth daily.    [provider]  azithromycin (ZITHROMAX) 250 MG tablet Take 2 tab x 1 day, then 1 tab daily x 4 days 12/18/17   [name], [name], MD  bisoprolol (ZEBETA) 5 MG tablet Take 1 tablet (5 mg total) by mouth daily. 11/20/17   Aline August, MD  budesonide-formoterol (SYMBICORT) 160-4.5 MCG/ACT inhaler Inhale 2 puffs into the lungs 2 (two) times daily. 01/28/17   [name], [name], MD  citalopram (CELEXA) 40 MG tablet Take 1 tablet (40 mg total) by mouth at bedtime. 11/19/17   Aline August, MD  dextromethorphan-guaiFENesin (MUCINEX DM) 30-600 MG 12hr tablet Take 1 tablet by mouth 2 (two) times daily as needed for cough.    [provider]  diltiazem (CARDIZEM) 30 MG tablet Take 1 tablet (30 mg total) by mouth every 12 (twelve) hours. 04/02/17   Fay Records, MD  feeding supplement, ENSURE ENLIVE, (ENSURE ENLIVE) LIQD Take 237 mLs by mouth 2 (two) times daily between  meals. Patient taking differently: Take 237 mLs by mouth. 237 mls --Three to four times per day. 01/17/17   Hosie Poisson, MD  furosemide (LASIX) 40 MG tablet Take 1 tablet (40 mg total) by mouth daily. 12/02/17   [name], [name], MD  gabapentin (NEURONTIN) 300 MG capsule Take 1 capsule (300 mg total) by mouth 2 (two) times daily. 11/19/17   Aline August, MD  lisinopril (PRINIVIL,ZESTRIL) 10 MG tablet Take 1 tablet (10 mg total) by mouth daily. 12/02/17   [name], [name], MD  Oxycodone HCl 10 MG TABS Take 1 tablet (10 mg total) by mouth every 8 (eight) hours as needed. 11/05/17   Wyatt Portela, MD  pantoprazole (PROTONIX) 40 MG tablet Take 1 tablet (40 mg total) by mouth daily. 04/16/17   [name], [name], MD  potassium chloride SA (K-DUR,KLOR-CON) 20 MEQ tablet Take 1 tablet (20 mEq total) by mouth daily. 11/05/17   Wyatt Portela, MD  predniSONE (DELTASONE) 20 MG tablet Take 2 tab daily x 5 days 12/18/17   [name], [name], MD  predniSONE (DELTASONE) 5 MG tablet TAKE 1 TABLET BY MOUTH EVERY DAY WITH BREAKFAST starting from 11/28/17 11/19/17   Aline August, MD  Probiotic Product (PROBIOTIC PO) Take 1 capsule by mouth daily.    [provider]  prochlorperazine (COMPAZINE) 10 MG tablet Take 1 tablet (10 mg total) by mouth every 6 (six) hours as needed for nausea or vomiting. 11/13/17   Wyatt Portela, MD  tamsulosin (FLOMAX) 0.4 MG CAPS capsule Take 1 capsule (0.4 mg total) by mouth daily after supper. 11/13/17   [name], [name], MD  traMADol (ULTRAM) 50 MG tablet Take 1 tablet (50 mg total) by mouth every 6 (six) hours as needed. 11/05/17   Wyatt Portela, MD  urea (CARMOL) 40 % CREA Apply to right lower leg once daily 12/02/17   [name], [name], MD    Family History Family History  Problem Relation Age of Onset  . Arthritis Mother   . Heart disease Father   . Bipolar disorder Father     Social History Social History   Tobacco Use  . Smoking status: Former Smoker    Packs/day:  0.00    Years: 54.00    Pack years: 0.00    Types: Cigarettes    Last attempt  to quit: 12/31/2016    Years since quitting: 0.9  . Smokeless tobacco: Never Used  Substance Use Topics  . Alcohol use: Yes    Comment: 01/22/2017 "once q 2-3 months; a beer or glass of wine"  . Drug use: Yes    Types: Marijuana    Comment: 01/22/2017 "nothing in the last several weeks"     Allergies   Amitriptyline hcl and Ezetimibe   Review of Systems Review of Systems  Unable to perform ROS: Acuity of condition  Respiratory: Positive for shortness of breath.      Physical Exam Updated Vital Signs BP (!) 181/93 (BP Location: Left Arm)   Pulse 87   Temp 98.6 F (37 C) (Axillary)   Resp (!) 22   Ht 1.778 m (5\' 10" )   Wt 122 kg (269 lb)   SpO2 99%   BMI 38.60 kg/m   Physical Exam CONSTITUTIONAL: Chronically ill-appearing HEAD: Normocephalic/atraumatic EYES: EOMI/PERRL ENMT: Mucous membranes moist, BiPAP mask in place NECK: supple no meningeal signs SPINE/BACK:entire spine nontender CV: S1/S2 noted, no murmurs/rubs/gallops noted LUNGS: Wheezing bilaterally, crackles also noted, distress noted ABDOMEN: soft, obese GU:no cva tenderness NEURO: Pt is awake/alert but he appears confused and is slow to respond moves all extremitiesx4.   EXTREMITIES: pulses normal/equal, full ROM SKIN: warm, color normal PSYCH: Unable to assess   ED Treatments / Results  Labs (all labs ordered are listed, but only abnormal results are displayed) Labs Reviewed  BASIC METABOLIC PANEL - Abnormal; Notable for the following components:      Result Value   Chloride 94 (*)    CO2 41 (*)    Glucose, Bld 132 (*)    Calcium 8.6 (*)    All other components within normal limits  CBC WITH DIFFERENTIAL/PLATELET - Abnormal; Notable for the following components:   WBC 13.0 (*)    MCHC 28.9 (*)    Neutro Abs 11.2 (*)    All other components within normal limits  BRAIN NATRIURETIC PEPTIDE - Abnormal; Notable for  the following components:   B Natriuretic Peptide 300.3 (*)    All other components within normal limits  BLOOD GAS, ARTERIAL - Abnormal; Notable for the following components:   pH, Arterial 7.203 (*)    pCO2 arterial 114 (*)    Bicarbonate 43.2 (*)    Acid-Base Excess 14.9 (*)    All other components within normal limits  TROPONIN I    EKG EKG Interpretation  Date/Time:  Monday December 21 2017 05:31:44 EDT Ventricular Rate:  83 PR Interval:    QRS Duration: 98 QT Interval:  418 QTC Calculation: 492 R Axis:   66 Text Interpretation:  Sinus rhythm Borderline prolonged QT interval No significant change since last tracing Confirmed by Ripley Fraise ([id number]) on 12/21/2017 5:39:10 AM   Radiology Dg Chest Port 1 View  Result Date: 12/21/2017 CLINICAL DATA:  Shortness of breath tonight. EXAM: PORTABLE CHEST 1 VIEW COMPARISON:  Radiographs 11/16/2017, CT 01/21/2017 FINDINGS: Unchanged cardiomegaly. Unchanged mediastinal contours. Slight increase in pulmonary edema from prior exam. Small right pleural effusion appears similar to prior. No definite left pleural effusion. Bleb in the right lung is unchanged. No pneumothorax or confluent airspace disease. Stable osseous structures. IMPRESSION: Unchanged cardiomegaly and right pleural effusion. Slight increase in pulmonary edema from prior exam. Suspect progressive CHF. Electronically Signed   By: Jeb Levering M.D.   On: 12/21/2017 06:47    Procedures Procedures CRITICAL CARE Performed by: Sharyon Cable Total critical care  time: 45 minutes Critical care time was exclusive of separately billable procedures and treating other patients. Critical care was necessary to treat or prevent imminent or life-threatening deterioration. Critical care was time spent personally by me on the following activities: development of treatment plan with patient and/or surrogate as well as nursing, discussions with consultants, evaluation of patient's  response to treatment, examination of patient, obtaining history from patient or surrogate, ordering and performing treatments and interventions, ordering and review of laboratory studies, ordering and review of radiographic studies, pulse oximetry and re-evaluation of patient's condition. Patient with acute respiratory failure requiring BiPAP close monitoring.  Patient requires admission Medications Ordered in ED Medications  albuterol (PROVENTIL,VENTOLIN) solution continuous neb (10 mg/hr Nebulization Given 12/21/17 0607)  furosemide (LASIX) injection 80 mg (80 mg Intravenous Given 12/21/17 0654)  nitroGLYCERIN (NITROGLYN) 2 % ointment 1 inch (1 inch Topical Given 12/21/17 0654)     Initial Impression / Assessment and Plan / ED Course  I have reviewed the triage vital signs and the nursing notes.  Pertinent labs & imaging results that were available during my care of the patient were reviewed by me and considered in my medical decision making (see chart for details).     5:53 AM Patient arrived in respiratory status now on BiPAP. He appears to be improving.  Will follow closely 6:28 AM Patient's mental status is improving.  He is noted to have significant hypercapnia but appears to be responding to BiPAP.  Albuterol nebs have been ordered.  Work-up is pending at this time 7:24 AM Patient is improving, he is more alert and talkative.  His vital signs are improving.  He will be admitted for respiratory failure.  I discussed the case with Dr. Lorin Mercy for admission to the stepdown unit Final Clinical Impressions(s) / ED Diagnoses   Final diagnoses:  COPD exacerbation ([place])  Acute respiratory failure with hypercapnia ([place])  Acute pulmonary edema [facility])    ED Discharge Orders    None       Ripley Fraise, MD 12/21/17 [id number]

## 2017-12-21 NOTE — H&P (Signed)
History and Physical    Scott Rollins URK:[id number] DOB: [birth year] DOA: 12/21/2017  PCP: Scott Sou, MD Consultants:  Scott Rollins - oncology; Scott Rollins - cardiology; Scott Rollins - urology Patient coming from:  Home - lives with wife; NOK: wife  Chief Complaint:  Respiratory distress  HPI: Scott Rollins is a 67 y.o. male with medical history significant of COPD with ongoing tobacco dependence and home O2 dependence; seizure d/o; metastatic prostate CA; pre-DM; afib; OSA on CPAP; morbid obesity; CAD; HTN; HLD; and CAD presenting with respiratory distress.  Started feeling bad about a year ago.  He quit smoking about 2 weeks ago, smoked marijuana 2-3 weeks ago and Dr. Anitra Rollins was not happy about this.  +cough, nonproductive.  SOB has been ongoing for 2-3 weeks.  He is still somewhat confused and is slow to answer questions and so history is somewhat limited.  He does wear O2 all the time, 3L at home.  He is inconsistent with his nightly CPAP.  He was admitted in May with a similar presentation of both COPD and CHF exacerbations and was diuresed 6L.  Echo at that time showed preserved EF with grade 1 diastolic dysfunction.   ED Course:  Placed on CPAP by EMS, changed to BIPAP in the ER with some improvement in respiratory distress and AMS.  CO2 114.  Possibly also CHF - given Lasix 80 mg.  Review of Systems: As per HPI; otherwise review of systems reviewed and negative.   Ambulatory Status:  Ambulates with a walker recently  Past Medical History:  Diagnosis Date  . Anginal pain ([place])   . Anxiety   . Arthritis    "hips, knees, thoracic back" (01/22/2017)  . CAP (community acquired pneumonia) 11/2016  . 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 COPD (chronic obstructive pulmonary disease) ([place])    "pearl study" pt thinks he is on Symbicort; pt noncompliant with COPD controller meds on/off due to financial reasons.  . Coronary atherosclerosis of  unspecified type of vessel, native or graft 2005   MI, s/p PCI with stent (pt reports 20+ interventions in the past, most recent cat 6/08 showed patent stents).  Normal LV function.  Nuclear stress test NEG 8/09.  . Depression    ?bipolar dx by psychiatrist?  . Gouty arthritis    Dr. Amil Rollins  . Heart murmur   . Hematochezia 09/2012   Hyperplastic polyp, diverticulosis, and internal hemorrhoids found on colonoscopy 10/2012  . History of hiatal hernia   . Hyperkalemia 01/29/2017   Kayexalate 30 given x 1.  . Hyperlipidemia    Hx of multiple statin intolerance  . Hypertension   . 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 Metastasis from malignant neoplasm of prostate ([place])    "to spine & left hip; dx'd 01/21/2017"  . Migraine    "none in the 2000s" (01/15/2017)  . Mitral valve prolapse ~ 1981  . Neuropathic pain of both feet 2015/2016   Vit B12 and A1c normal 10/2014  . Obesities, morbid ([place])   . On home oxygen therapy    "2L prn" (01/15/2017)  . OSA on CPAP    w/oxygen (01/15/2017)  . Osteoarthrosis, unspecified whether generalized or localized, unspecified site    DDD  . PAF (paroxysmal atrial fibrillation) ([place]) 12/2016   Started in the context of resp illness/lots of bronchodilators.  Started eliquis and cardizem during hospitalization 01/2017 (Scott Rollins).  . Pre-diabetes 2016/17  .  Prostate cancer metastatic to multiple sites [facility]) 09/2010; 2018   2012: Localized, high risk disease (Scott Rollins/Scott Rollins):  s/p 5 wks ext bm rad + seed boost, as well as hormone blockade x 1 yr.  Biochemical recurrence 11/2015;  2018 imaging showed bone mets and lymph node involvement.  Palliative Lupron and zytiga 2018/19--good PSA response as of 09/2017 onc f/u.  Marland Kitchen Radiation    Pelvic--for prostate ca: 25 treatments/01/2011  . Seizure disorder ([place]) 08-15-2011   Deja vu sensations: no w/u.  These stopped when neurontin 300mg  tid was started for a different reason.  . Status post chemotherapy     10/2010 thru 06/2011  . Tobacco dependence    still smoking as of fall 2018.    Past Surgical History:  Procedure Laterality Date  . CARDIAC CATHETERIZATION  23 caths  . COLONOSCOPY WITH PROPOFOL N/A 04/05/2013   Dr. Fuller Rollins.  Polypectomy (hyperplastic--recall 10 yrs).  Moderate diverticulosis, +internal hemorrhoids.  No radiation proctitis.  . CORONARY ANGIOPLASTY    . CORONARY ANGIOPLASTY WITH STENT PLACEMENT     "5-6 stents" (01/15/2017)  . HERNIA REPAIR    . HOT HEMOSTASIS N/A 04/05/2013   Procedure: HOT HEMOSTASIS (ARGON PLASMA COAGULATION/BICAP);  Surgeon: Scott Artist, MD;  Location: [facility];  Service: Endoscopy;  Laterality: N/A;  . INSERTION PROSTATE RADIATION SEED  02/24/2011  . TRANSTHORACIC ECHOCARDIOGRAM  12/2016   2018: EF 55-60%, moderate LVH, grd I DD, mild LA dilation.  11/17/17: LVEF of 55 to 75%, grade 1 diastolic dysfunction no wall motion abnormalities.  Marland Kitchen UMBILICAL HERNIA REPAIR      Social History   Socioeconomic History  . Marital status: Married    Spouse name: Scott Rollins  . Number of children: Not on file  . Years of education: Not on file  . Highest education level: Not on file  Occupational History  . Occupation: Retired    Fish farm manager: [employer]  Social Needs  . Financial resource strain: Not on file  . Food insecurity:    Worry: Not on file    Inability: Not on file  . Transportation needs:    Medical: Not on file    Non-medical: Not on file  Tobacco Use  . Smoking status: Former Smoker    Packs/day: 1.50    Years: 55.00    Pack years: 82.50    Types: Cigarettes  . Smokeless tobacco: Never Used  . Tobacco comment: 2 weeks ago  Substance and Sexual Activity  . Alcohol use: Not Currently    Comment: h/o heavy use - at least 6 daily, last use years ago  . Drug use: Yes    Types: Marijuana    Comment: 2-3 weeks ago last use  . Sexual activity: Never  Lifestyle  . Physical activity:    Days per week: Not on file    Minutes per  session: Not on file  . Stress: Not on file  Relationships  . Social connections:    Talks on phone: Not on file    Gets together: Not on file    Attends religious service: Not on file    Active member of club or organization: Not on file    Attends meetings of clubs or organizations: Not on file    Relationship status: Not on file  . Intimate partner violence:    Fear of current or ex partner: Not on file    Emotionally abused: Not on file    Physically abused: Not on file  Forced sexual activity: Not on file  Other Topics Concern  . Not on file  Social History Narrative   ** Merged History Encounter **   Lives in [place] but also enjoys spending time at [place] Reactions  . Amitriptyline Hcl Nausea Only    REACTION: nausea, elevated bp  . Ezetimibe Other (See Comments)    REACTION: chest pain, fatigue    Family History  Problem Relation Age of Onset  . Arthritis Mother   . Heart disease Father   . Bipolar disorder Father     Prior to Admission medications   Medication Sig Start Date End Date Taking? Authorizing Provider  abiraterone acetate (ZYTIGA) 250 MG tablet Take 4 tablets (1,000 mg total) by mouth daily. Take on an empty stomach 1 hour before or 2 hours after a meal 11/13/17   Wyatt Portela, MD  albuterol (PROAIR HFA) 108 (90 Base) MCG/ACT inhaler Inhale 2 puffs into the lungs every 6 (six) hours as needed for wheezing or shortness of breath. 11/04/17   [name], [name], MD  albuterol (PROVENTIL) (2.5 MG/3ML) 0.083% nebulizer solution Take 3 mLs (2.5 mg total) by nebulization every 4 (four) hours as needed for wheezing or shortness of breath. 12/02/17   [name], [name], MD  allopurinol (ZYLOPRIM) 300 MG tablet Take 1 tablet (300 mg total) by mouth daily. 12/17/16   [name], [name], MD  apixaban (ELIQUIS) 5 MG TABS tablet Take 1 tablet (5 mg total) by mouth 2 (two) times daily. Patient not taking: Reported on 12/02/2017 09/28/17    Scott Sou, MD  aspirin 325 MG EC tablet Take 325 mg by mouth daily.    [provider]  azithromycin (ZITHROMAX) 250 MG tablet Take 2 tab x 1 day, then 1 tab daily x 4 days 12/18/17   [name], [name], MD  bisoprolol (ZEBETA) 5 MG tablet Take 1 tablet (5 mg total) by mouth daily. 11/20/17   Aline August, MD  budesonide-formoterol (SYMBICORT) 160-4.5 MCG/ACT inhaler Inhale 2 puffs into the lungs 2 (two) times daily. 01/28/17   [name], [name], MD  citalopram (CELEXA) 40 MG tablet Take 1 tablet (40 mg total) by mouth at bedtime. 11/19/17   Aline August, MD  dextromethorphan-guaiFENesin (MUCINEX DM) 30-600 MG 12hr tablet Take 1 tablet by mouth 2 (two) times daily as needed for cough.    [provider]  diltiazem (CARDIZEM) 30 MG tablet Take 1 tablet (30 mg total) by mouth every 12 (twelve) hours. 04/02/17   Fay Records, MD  feeding supplement, ENSURE ENLIVE, (ENSURE ENLIVE) LIQD Take 237 mLs by mouth 2 (two) times daily between meals. Patient taking differently: Take 237 mLs by mouth. 237 mls --Three to four times per day. 01/17/17   Hosie Poisson, MD  furosemide (LASIX) 40 MG tablet Take 1 tablet (40 mg total) by mouth daily. 12/02/17   [name], [name], MD  gabapentin (NEURONTIN) 300 MG capsule Take 1 capsule (300 mg total) by mouth 2 (two) times daily. 11/19/17   Aline August, MD  lisinopril (PRINIVIL,ZESTRIL) 10 MG tablet Take 1 tablet (10 mg total) by mouth daily. 12/02/17   [name], [name], MD  Oxycodone HCl 10 MG TABS Take 1 tablet (10 mg total) by mouth every 8 (eight) hours as needed. 11/05/17   Wyatt Portela, MD  pantoprazole (PROTONIX) 40 MG tablet Take 1 tablet (40 mg total) by mouth daily. 04/16/17   [name],  [name], MD  potassium chloride SA (K-DUR,KLOR-CON) 20 MEQ tablet Take 1 tablet (20 mEq total) by mouth daily. 11/05/17   Wyatt Portela, MD  predniSONE (DELTASONE) 20 MG tablet Take 2 tab daily x 5 days 12/18/17   [name], [name], MD  predniSONE  (DELTASONE) 5 MG tablet TAKE 1 TABLET BY MOUTH EVERY DAY WITH BREAKFAST starting from 11/28/17 11/19/17   Aline August, MD  Probiotic Product (PROBIOTIC PO) Take 1 capsule by mouth daily.    [provider]  prochlorperazine (COMPAZINE) 10 MG tablet Take 1 tablet (10 mg total) by mouth every 6 (six) hours as needed for nausea or vomiting. 11/13/17   Wyatt Portela, MD  tamsulosin (FLOMAX) 0.4 MG CAPS capsule Take 1 capsule (0.4 mg total) by mouth daily after supper. 11/13/17   [name], [name], MD  traMADol (ULTRAM) 50 MG tablet Take 1 tablet (50 mg total) by mouth every 6 (six) hours as needed. 11/05/17   Wyatt Portela, MD  urea (CARMOL) 40 % CREA Apply to right lower leg once daily 12/02/17   Scott Sou, MD    Physical Exam: Vitals:   12/21/17 0800 12/21/17 0815 12/21/17 0930 12/21/17 0945  BP: (!) 179/81 (!) 163/83 (!) 165/83 (!) 164/93  Pulse: 72 71 70 67  Resp: 19 16 17 13   Temp:      TempSrc:      SpO2: 93% 94% 96% 98%  Weight:      Height:         General:  Appears calm and comfortable and is NAD on BIPAP Eyes:   EOMI, normal lids, iris ENT:  grossly normal hearing, BIPAP in place Neck:  no LAD, masses or thyromegaly; no carotid bruits Cardiovascular:  RRR, no m/r/g. 1-2+ LE edema.  Respiratory:  Diffuse rhonchi with occasional wheezing.  Normal respiratory effort on BIPAP. Abdomen:  soft, NT, ND, NABS Skin:  Diffuse BLE hyperkeratosis with some desquamation Musculoskeletal:  grossly normal tone BUE/BLE, good ROM, no bony abnormality Psychiatric:  grossly normal mood and affect with intermittent emotional lability (recognizes that he is reaching the end of his life and this makes him emotional), speech somewhat slowed and still with some apparent confusion Neurologic:  CN 2-12 grossly intact, moves all extremities in coordinated fashion, sensation intact    Radiological Exams on Admission: Dg Chest Port 1 View  Result Date: 12/21/2017 CLINICAL DATA:   Shortness of breath tonight. EXAM: PORTABLE CHEST 1 VIEW COMPARISON:  Radiographs 11/16/2017, CT 01/21/2017 FINDINGS: Unchanged cardiomegaly. Unchanged mediastinal contours. Slight increase in pulmonary edema from prior exam. Small right pleural effusion appears similar to prior. No definite left pleural effusion. Bleb in the right lung is unchanged. No pneumothorax or confluent airspace disease. Stable osseous structures. IMPRESSION: Unchanged cardiomegaly and right pleural effusion. Slight increase in pulmonary edema from prior exam. Suspect progressive CHF. Electronically Signed   By: Jeb Levering M.D.   On: 12/21/2017 06:47    EKG: Independently reviewed.  NSR with rate 83; nonspecific ST changes with no evidence of acute ischemia; NSCSLT   Labs on Admission: I have personally reviewed the available labs and imaging studies at the time of the admission.  Pertinent labs:   ABG: 7.203/114/100/43.2 CO2 41, prior 37 on 6/5 Glucose 132 BNP 300.3; 183.7 on 5/7 Troponin <0.03 WBC 13.0  Assessment/Rollins Principal Problem:   Acute on chronic respiratory failure with hypoxia and hypercapnia (HCC) Active Problems:   Essential hypertension   TOBACCO ABUSE  Chronic pain syndrome   OSA (obstructive sleep apnea)   Atrial fibrillation (HCC)   Prostate cancer metastatic to bone [facility])   Acute exacerbation of chronic obstructive pulmonary disease (COPD) (HCC)   Acute exacerbation of CHF (congestive heart failure) (HCC)   Acute on chronic respiratory failure with hypoxia/hypercapnia due to COPD exacerbation -Patient's shortness of breath is most likely caused by acute COPD exacerbation.  -He had Azithromycin and Prednisone called in on 6/7 and he has not had relief. -He has history of O2-dependent COPD -He does not have fever or leukocytosis. Chest x-ray is not consistent with pneumonia -His pCO2 was alarmingly high on ABG at 114 -He appears to have some improvement on BIPAP -will admit  patient to SDU and continue BIPAP -Nebulizers: scheduled Duoneb and prn albuterol -Solu-Medrol 60 mg IV BID  -IV Doxycycline (no improvement with PO Azithromycin PTA)  Diastolic heart failure -Patient with recent similar admission -Elevated BNP -CXR appears to show worsening CHF -Was given Lasix 80 mg x 1 in ER and will repeat with 80 mg IV BID -Continue BIPAP for now -Normal kidney function at this time, will follow -Repeat EKG in AM  HTN -Continue Bisopolol, Cardizem, and Lisinopril -Suboptimal control in the ER -Will add prn IV hydralazine  Metastatic prostate CA -On Zytiga, but he acknowledges that this is incurable -He is beginning to recognize that he is nearing the end of his life; this appears to be true due to his respiratory condition as well as his terminal CA -He is in agreement with palliative care consult  Tobacco dependence -Reports stopping 2 weeks ago, but this is somewhat suspect -Will order patch -Smoking cessation counseling needs to be performed on an ongoing basis -Also with acknowledged fairly recent marijuana use  OSA -Inconsistent use of CPAP -He is likely to benefit from this qhs once he is off BIPAP  Afib -Rate controlled on low-dose Cardizem and Bisoprolol -Continue Cardizem for now  Chronic pain syndrome -I have reviewed this patient in the [place] Controlled Substances Reporting System.  He is receiving medications from only one provider and appears to be taking them as prescribed. -He is not at particularly high risk of opioid misuse, diversion, or overdose.  DVT prophylaxis: Eliquis  Code Status:  DNR - confirmed with patient Family Communication: None present Disposition Rollins:  Home once clinically improved Consults called: CM/SW/PT/OT/Nutrition/RT  Admission status: Admit - It is my clinical opinion that admission to INPATIENT is reasonable and necessary because this patient will require at least 2 midnights in the hospital to treat this  condition based on the medical complexity of the problems presented.  Given the aforementioned information, the predictability of an adverse outcome is felt to be significant.   Karmen Bongo MD [facility]  If note is complete, please contact covering daytime or nighttime physician. [email or web address] Password [email or web address]  12/21/2017, 10:06 AM

## 2017-12-21 NOTE — ED Notes (Signed)
Pty moved to hospital bed and condom cath pulls free. . Condom cath reapplied and pt tolerates well.

## 2017-12-21 NOTE — Progress Notes (Signed)
Patient is off of bipap at this time and on a 3L Flasher. RT will monitor.

## 2017-12-21 NOTE — ED Notes (Signed)
Heart Healthy diet dinner tray ordered. 

## 2017-12-21 NOTE — Clinical Social Work Note (Signed)
Clinical Social Work Assessment  Patient Details  Name: Scott Rollins MRN: [id number] Date of Birth: 03/13/1951  Date of referral:  12/21/17               Reason for consult:  Other (Comment Required)(COPD Gold)                Permission sought to share information with:  Other Permission granted to share information::  No  Name::     none given  Agency::   none given  Relationship::  none given  Contact Information:   none given  Housing/Transportation Living arrangements for the past 2 months:  Single Family Home Source of Information:  Patient Patient Interpreter Needed:  None Criminal Activity/Legal Involvement Pertinent to Current Situation/Hospitalization:  No - Comment as needed Significant Relationships:  Adult Children, Spouse Lives with:  Spouse Do you feel safe going back to the place where you live?  Yes Need for family participation in patient care:  Yes (Comment)  Care giving concerns: CSW spoke with pt at bedside. At this time pt shook head to deny any concerns regarding care.    Social Worker assessment / plan:  CSW spoke with pt at bedside. CSW was had trouble making out pt's words as pt was on breathing machine for little assistance. CSW was informed by pt that pt is from home. Pt was not able to clarify if pt was from home with another person or from home alone. Pt expressed that pt has support from family members but was unable to identify who supports included. Pt has wife and son listed in contacts CSW suggested that these two may also be supporters for pt. CSW was informed by pt that pt has not had any depression or anxiety and has been having good food intake.   During this assessment pt was lying in bed with breathing mask on. CSW observed that pt was having trouble breathing while speaking with CSW therefore CSW concluded assessment to allow pt to gather breaths.   Employment status:  Retired Forensic scientist:  Medicare PT Recommendations:  Not  assessed at this time [place] / Referral to community resources:     Patient/Family's Response to care:  Pt's response to care appeared to be understanding as evident by pt shaking head as being agreeable to plan of care at this time   Patient/Family's Understanding of and Emotional Response to Diagnosis, Current Treatment, and Prognosis:  No further questions or concerns have been presented to CSW at this time. Emotional response to care was unclear as pt was unable to express emotions at this time.   Emotional Assessment Appearance:  Appears older than stated age Attitude/Demeanor/Rapport:  Unable to Assess(pt on breathing support. ) Affect (typically observed):  Appropriate Orientation:  Oriented to Self, Oriented to  Time, Oriented to Place, Oriented to Situation Alcohol / Substance use:  Not Applicable Psych involvement (Current and /or in the community):  No (Comment)  Discharge Needs  Concerns to be addressed:  Care Coordination Readmission within the last 30 days:  No Current discharge risk:  Dependent with Mobility Barriers to Discharge:  Continued Medical Work up   [employer], [place] 12/21/2017, 10:05 AM

## 2017-12-21 NOTE — ED Notes (Signed)
Pt informed to bring zytiga from home.

## 2017-12-21 NOTE — ED Notes (Addendum)
Pt alert, responds appropriately to qustions and follows command. bilsateral ue tremors noted . Tolerates bipap well.

## 2017-12-21 NOTE — ED Notes (Signed)
Hospitalist bedside for evaluation and planning 

## 2017-12-22 ENCOUNTER — Other Ambulatory Visit: Payer: Self-pay | Admitting: Pharmacy Technician

## 2017-12-22 ENCOUNTER — Other Ambulatory Visit: Payer: Self-pay

## 2017-12-22 DIAGNOSIS — J810 Acute pulmonary edema: Secondary | ICD-10-CM

## 2017-12-22 DIAGNOSIS — I509 Heart failure, unspecified: Secondary | ICD-10-CM

## 2017-12-22 DIAGNOSIS — Z7189 Other specified counseling: Secondary | ICD-10-CM

## 2017-12-22 DIAGNOSIS — Z515 Encounter for palliative care: Secondary | ICD-10-CM

## 2017-12-22 DIAGNOSIS — F17200 Nicotine dependence, unspecified, uncomplicated: Secondary | ICD-10-CM

## 2017-12-22 LAB — CBC
HCT: 39.1 % (ref 39.0–52.0)
HEMOGLOBIN: 12 g/dL — AB (ref 13.0–17.0)
MCH: 27.8 pg (ref 26.0–34.0)
MCHC: 30.7 g/dL (ref 30.0–36.0)
MCV: 90.7 fL (ref 78.0–100.0)
PLATELETS: 223 10*3/uL (ref 150–400)
RBC: 4.31 MIL/uL (ref 4.22–5.81)
RDW: 13.6 % (ref 11.5–15.5)
WBC: 7.4 10*3/uL (ref 4.0–10.5)

## 2017-12-22 LAB — BASIC METABOLIC PANEL
ANION GAP: 7 (ref 5–15)
BUN: 18 mg/dL (ref 6–20)
CHLORIDE: 88 mmol/L — AB (ref 101–111)
CO2: 46 mmol/L — ABNORMAL HIGH (ref 22–32)
CREATININE: 0.88 mg/dL (ref 0.61–1.24)
Calcium: 8.7 mg/dL — ABNORMAL LOW (ref 8.9–10.3)
GFR calc non Af Amer: >60 mL/min (ref >60)
Glucose, Bld: 118 mg/dL — ABNORMAL HIGH (ref 65–99)
POTASSIUM: 3.7 mmol/L (ref 3.5–5.1)
SODIUM: 141 mmol/L (ref 135–145)

## 2017-12-22 LAB — MRSA PCR SCREENING: MRSA by PCR: NEGATIVE

## 2017-12-22 MED ORDER — BISOPROLOL FUMARATE 5 MG PO TABS
10.0000 mg | ORAL_TABLET | Freq: Every day | ORAL | Status: DC
  Administered 2017-12-23 – 2017-12-27 (×5): 10 mg via ORAL
  Filled 2017-12-22 (×5): qty 2

## 2017-12-22 MED ORDER — DOXYCYCLINE HYCLATE 100 MG PO TABS
100.0000 mg | ORAL_TABLET | Freq: Two times a day (BID) | ORAL | Status: AC
  Administered 2017-12-23 – 2017-12-26 (×8): 100 mg via ORAL
  Filled 2017-12-22 (×8): qty 1

## 2017-12-22 MED ORDER — OXYCODONE HCL 5 MG PO TABS
10.0000 mg | ORAL_TABLET | Freq: Three times a day (TID) | ORAL | Status: DC | PRN
  Administered 2017-12-22 – 2017-12-27 (×13): 10 mg via ORAL
  Filled 2017-12-22 (×13): qty 2

## 2017-12-22 MED ORDER — ASPIRIN EC 81 MG PO TBEC
81.0000 mg | DELAYED_RELEASE_TABLET | Freq: Every day | ORAL | Status: DC
  Administered 2017-12-23 – 2017-12-27 (×5): 81 mg via ORAL
  Filled 2017-12-22 (×5): qty 1

## 2017-12-22 MED ORDER — DILTIAZEM HCL 60 MG PO TABS
60.0000 mg | ORAL_TABLET | Freq: Two times a day (BID) | ORAL | Status: DC
  Administered 2017-12-22 – 2017-12-27 (×10): 60 mg via ORAL
  Filled 2017-12-22 (×10): qty 1

## 2017-12-22 MED ORDER — TRAMADOL HCL 50 MG PO TABS
50.0000 mg | ORAL_TABLET | Freq: Four times a day (QID) | ORAL | Status: DC | PRN
  Administered 2017-12-23 – 2017-12-27 (×9): 50 mg via ORAL
  Filled 2017-12-22 (×9): qty 1

## 2017-12-22 NOTE — ED Notes (Signed)
Rounding doctor at bedside.

## 2017-12-22 NOTE — Progress Notes (Signed)
Pt is on NIV QHS at this time tolerating it well. Settings are in the flowsheet.

## 2017-12-22 NOTE — ED Notes (Signed)
Patient started on BiPAP again.

## 2017-12-22 NOTE — Progress Notes (Signed)
Chart reviewed and initial [place] discussion completed with patient. Will contact wife, [name] and initiate palliative discussion also.   Full note to follow.   NO CHARGE  Ihor Dow, FNP-C Palliative Medicine Team  Phone: [phone] Fax: [phone]

## 2017-12-22 NOTE — Progress Notes (Signed)
Pt placed back on bipap at this time due to desat.  RT will monitor.

## 2017-12-22 NOTE — ED Notes (Signed)
Rechecked patients O2 sats off of BiPAP, 84% on 4L. Called respiratory increased to 6L until she could start BiPAP again.

## 2017-12-22 NOTE — Progress Notes (Signed)
[name] - Stepdown/ICU TEAM  Scott Rollins  HER:[id number] DOB: [birth year] DOA: 12/21/2017 PCP: [name], MD    Brief Narrative:  67 y.o. male with a history of COPD with ongoing tobacco use on home O2 (3L all times); seizure d/o; metastatic prostate CA; pre-DM; afib; OSA on CPAP; morbid obesity; CAD; HTN; HLD; and CAD who presenting with respiratory distress and confusion.    Subjective: The patient is sitting up on the side of his bed.  He reports that his breathing has dramatically improved.  He is able to complete full sentences without difficulty.  He denies chest pain nausea vomiting or abdominal pain.  The patient tells me he wishes to wrists and his blue order.  I spoke with him clearly and frankly and confirmed this was indeed his desire.  He does wish to undergo full cpr/intubation/mechanical ventilation in the case of respiratory or cardiac arrest.  Assessment & Plan:  Acute on chronic respiratory failure with hypoxia/hypercapnia - COPD exacerbation CXR not c/w PNA - much improved with short-term use of BiPAP and the usual medical therapy - continue same and follow closely  Diastolic heart failure Also appears to have contributed to his acute respiratory failure - has diuresed significantly since admission - continue diuresis and follow ins and outs and daily weights closely - EF 55-60% w/ grade 1 DD per TTE 11/17/17 - dry weight appears to be approximately 122 kg  Filed Weights   12/21/17 0529  Weight: 122 kg (269 lb)    HTN Blood pressure very poorly controlled - clearly contributing to above issues - adjust medical therapy and follow   Metastatic prostate CA Followed by Oncology  Tobacco dependence Reports stopping 2 weeks ago, but this is somewhat suspect  OSA Inconsistent use of CPAP - resume QHS while in hospital and counsel on need for use at home   Chronic Afib Rate controlled - cont Eliquis   Chronic pain syndrome According to  the [place] Controlled Substances Reporting System he is receiving medications from only one provider and appears to be taking them as prescribed  DVT prophylaxis: Eliquis Code Status: FULL CODE Family Communication: no family present at time of exam  Disposition Plan: SDU  Consultants:  none  Antimicrobials:  none   Objective: Blood pressure (!) 151/63, pulse 72, temperature 97.8 F (36.6 C), temperature source Oral, resp. rate 18, height 5\' 10"  (1.778 m), weight 122 kg (269 lb), SpO2 92 %.  Intake/Output Summary (Last 24 hours) at 12/22/2017 1554 Last data filed at 12/22/2017 0955 Gross per 24 hour  Intake 500 ml  Output 3625 ml  Net -3125 ml   Filed Weights   12/21/17 0529  Weight: 122 kg (269 lb)    Examination: General: No acute respiratory distress at rest Lungs: mild diffuse crackles - no active wheezing Cardiovascular: distant HS - no M or rub - regular rate  Abdomen: Nontender, overweight, soft, bowel sounds positive, no rebound Extremities: 1+ edema B LE   CBC: Recent Labs  Lab 12/16/17 1510 12/21/17 0531 12/22/17 0535  WBC 7.0 13.0* 7.4  NEUTROABS 5.1 11.2*  --   HGB 13.0 13.5 12.0*  HCT 42.7 46.7 39.1  MCV 92.6 96.1 90.7  PLT 211 267 856   Basic Metabolic Panel: Recent Labs  Lab 12/16/17 1510 12/21/17 0531 12/22/17 0535  NA 142 143 141  K 3.6 4.7 3.7  CL 95* 94* 88*  CO2 37* 41* 46*  GLUCOSE 120 132* 118*  BUN 12 14 18   CREATININE 0.80 0.76 0.88  CALCIUM 9.5 8.6* 8.7*   GFR: Estimated Creatinine Clearance: 105.2 mL/min (by C-G formula based on SCr of 0.88 mg/dL).  Liver Function Tests: Recent Labs  Lab 12/16/17 1510  AST 18  ALT 12  ALKPHOS 112  BILITOT 0.5  PROT 6.6  ALBUMIN 3.3*    Cardiac Enzymes: Recent Labs  Lab 12/21/17 0531  TROPONINI <0.03    HbA1C: Hgb A1c MFr Bld  Date/Time Value Ref Range Status  01/15/2017 09:18 AM 5.3 4.8 - 5.6 % Final    Comment:    (NOTE)         Pre-diabetes: 5.7 - 6.4         Diabetes:  >6.4         Glycemic control for adults with diabetes: <7.0   10/31/2016 09:34 AM 6.3 4.6 - 6.5 % Final    Comment:    Glycemic Control Guidelines for People with Diabetes:Non Diabetic:  <6%Goal of Therapy: <7%Additional Action Suggested:  >8%      Scheduled Meds: . abiraterone acetate  1,000 mg Oral Daily  . apixaban  5 mg Oral BID  . aspirin  325 mg Oral Daily  . bisoprolol  5 mg Oral Daily  . citalopram  40 mg Oral QHS  . diltiazem  30 mg Oral Q12H  . docusate sodium  100 mg Oral BID  . feeding supplement (ENSURE ENLIVE)  237 mL Oral TID BM  . furosemide  80 mg Intravenous Q12H  . gabapentin  300 mg Oral QHS  . ipratropium-albuterol  3 mL Nebulization Q6H  . lisinopril  10 mg Oral Daily  . nicotine  14 mg Transdermal Daily  . pantoprazole  40 mg Oral Daily  . predniSONE  40 mg Oral Q supper  . sodium chloride flush  3 mL Intravenous Q12H  . tamsulosin  0.4 mg Oral QPC supper     LOS: 1 day   Cherene Altes, MD [facility] Office  [phone] Pager - Text Page per Amion as per below:  On-Call/Text Page:      [email or web address]      password [email or web address]  If 7PM-7AM, please contact night-coverage [email or web address] Password [facility] 12/22/2017, 3:54 PM

## 2017-12-22 NOTE — ED Notes (Addendum)
Pt taken off bipap for room transfer. Will monitor pt to see if he can tolerate 3L [place].

## 2017-12-22 NOTE — Progress Notes (Signed)
Responded to [facility] to assist with AD.  Nurse gave Patient AD.  Patient will notify chaplain services when ready.  Jaclynn Major, [place], [facility], Pager [phone]

## 2017-12-22 NOTE — Care Management Note (Signed)
Case Management Note  Patient Details  Name: Scott Rollins MRN: [id number] Date of Birth: [birth year]  Subjective/Objective:                  67 y.o. male with medical history significant of COPD with ongoing tobacco dependence and home O2 dependence; seizure d/o; metastatic prostate CA; pre-DM; afib; OSA on CPAP; morbid obesity; CAD; HTN; HLD; and CAD presenting with respiratory distress. From home with spouse.  Action/Plan: Admit status INPATIENT (COPD); anticipate discharge [place].   Expected Discharge Date:  (unknown)               Expected Discharge Plan:  [place]  In-House Referral:  Clinical Social Work  Discharge planning Services  CM Consult  Post Acute Care Choice:    Choice offered to:     DME Arranged:    DME Agency:     HH Arranged:    [place] Agency:     Status of Service:  In process, will continue to follow  If discussed at Long Length of Stay Meetings, dates discussed:    Additional Comments: PCP: PHILIP Imelda Pillow, RN 12/22/2017, 9:21 AM

## 2017-12-22 NOTE — Progress Notes (Signed)
Patient admitted to 6E04 from ED via stretcher. Patient ambulated to bed using front wheel walker. Bed in low position, wheels locked.  Patient denies chest pain, c/o DOE.  Telemetry monitor applied.  Patient oriented to environment, including call bell, TV, meal times, and hourly rounding.

## 2017-12-22 NOTE — ED Notes (Signed)
Spoke with pharmacy who will be send ensure for patient.

## 2017-12-22 NOTE — ED Notes (Addendum)
Pt wakes while exam. Opens eye and responds appriately to questions.

## 2017-12-22 NOTE — Patient Outreach (Signed)
[place] [facility]) Care Management  12/22/2017  Scott Rollins 15-Jun-1951 [id number]   Contacted [employer] to follow up on patients application for Proair. Spoke to [name] who stated that patient has been approved as of 06/07- 12/18/2018. Order of drug has already been shipped out.  Patient currently admitted, will try to update patient in the next 2 weeks on all 3 application statuses.   Maud Deed [place], [place] Management [phone]

## 2017-12-22 NOTE — Progress Notes (Signed)
RT removed patient from bipap and placed on 4lnc. Vitals stable at this time. No complications. Patient is tolerating well at this time. RT will continue to monitor.

## 2017-12-22 NOTE — ED Notes (Signed)
Pt c/o left rib pain. States it is part of his bone pain.

## 2017-12-22 NOTE — Plan of Care (Signed)
  Problem: Clinical Measurements: Goal: Respiratory complications will improve Outcome: Progressing   

## 2017-12-22 NOTE — Consult Note (Signed)
Consultation Note Date: 12/22/17   Patient Name: Scott Rollins  DOB: [birth year]  MRN: [id number]  Age / Sex: 67 y.o., male  PCP: Scott Sou, MD Referring Physician: Allie Bossier, MD  Reason for Consultation: Establishing goals of care  HPI/Patient Profile: 67 y.o. male  with past medical history of COPD on home oxygen 3L, smoker, CHF, metastatic prostate cancer, afib, DM, seizures, OSA, CAD, HTN, HLD, and obesity admitted on 12/21/2017 with respiratory distress. Recent admission for COPD/CHF. ECHO revealed preserved ejection fraction and grade 1 diastolic dysfunction. In ED, patient placed on BiPAP with improvement in respiratory status. Chest xray negative for pneumonia, worsening heart failure. CO2 114. Lasix 63m given. Palliative medicine consultation for goals of care.   Clinical Assessment and Goals of Care: I have reviewed medical records and met with patient at bedside. Patient is awake, alert, oriented and able to participate in [name]. He appears comfortable off of BiPAP.   I introduced Palliative Medicine as specialized medical care for people living with serious illness. It focuses on providing relief from the symptoms and stress of a serious illness. The goal is to improve quality of life for both the patient and the family.  We discussed a brief life review of the patient. Married to wife, DShauna Rollins for 432years. They have two children. Mr. UWenigerrecalls his health history with cancer diagnosis in 2012. He has been receiving palliative therapy and only complaint is fatigue. Followed by Dr. SAlen Rollins He has ongoing COPD and CHF, recently requiring 24/7 oxygen.   Discussed hospital diagnoses and interventions. Educated on disease trajectory of COPD as well as progressive cancer. [name] a good understanding that his cancer is not curable. He speaks of friends that have  died from cancer and "going down quickly." He feels as if his time is getting shorter, with declining functional status. He requires assist with bathing. Appetite fair to poor. He also understands COPD and CHF are chronic, progressive diseases. [name] he "saw the light" yesterday when he arrived to the ED.   I attempted to elicit values and goals of care important to the patient. Patient shares that he is more worried about his daughter and wife and how they will cope when he is gone. He does not fear dying.    We discussed palliative approach to care when cure is not possible, including use of symptom management to ensure comfort and quality. We discussed his current pain medication regimen, which does provide him relief of chronic bone pain. Daily bowel movements.   Patient does not have a living will or documented HCPOA paperwork. He is interested in reviewing AD packet. Chaplain consult placed.   We spent time discussing outpatient palliative versus hospice services. [facility] He is worried about being away from his doctors. Encouraged him to consider hospice services, which can help manage symptom management as well as prevention re-hospitalization.  Questions and concerns were addressed.  PMT contact information given. Therapeutic listening and emotional support provided.    SUMMARY OF RECOMMENDATIONS    Initial palliative discussion with patient at bedside. He does understand that his cancer is incurable and the chronic progressive nature of COPD and CHF.   Although knowing this, he wishes to be a FULL code and FULL scope treatment this admission after further conversation with Dr. Thereasa Rollins.  Patient interested in completing AD packet. Spiritual consult placed.   Educated on outpatient palliative versus hospice options. Patient wishes to go [place] home at [facility], but worried about being away from his  doctors. I encouraged Mr. Scott Rollins to consider hospice services in [facility] which can help manage his symptoms and prevent hospitalization.   PMT will follow.  Patient gives me permission to contact his wife to introduce role of palliative medicine.   Code Status/Advance Care Planning:  Patient originally was DNR upon arrival but has requested FULL code after conversation with Dr. Thereasa Rollins.   Symptom Management:   Per attending   Palliative Prophylaxis:   Bowel Regimen and Frequent Pain Assessment  Psycho-social/Spiritual:   Desire for further Chaplaincy support: yes  Additional Recommendations: Caregiving  Support/Resources and Education on Hospice  Prognosis:   Unable to determine: guarded with acute on chronic respiratory failure secondary to COPD and CHF. Also with progressive metastatic prostate cancer.  Discharge Planning: To Be Determined      Primary Diagnoses: Present on Admission: . Acute on chronic respiratory failure with hypoxia and hypercapnia (HCC) . Acute exacerbation of chronic obstructive pulmonary disease (COPD) ([place]) . Atrial fibrillation ([place]) . Chronic pain syndrome . Essential hypertension . OSA (obstructive sleep apnea) . Prostate cancer metastatic to bone ([place]) . TOBACCO ABUSE   I have reviewed the medical record, interviewed the patient and family, and examined the patient. The following aspects are pertinent.  Past Medical History:  Diagnosis Date  . Anginal pain ([place])   . Anxiety   . Arthritis    "hips, knees, thoracic back" (01/22/2017)  . CAP (community acquired pneumonia) 11/2016  . 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 COPD (chronic obstructive pulmonary disease) ([place])    "pearl study" pt thinks he is on Symbicort; pt noncompliant with COPD controller meds on/off due to financial reasons.  . Coronary atherosclerosis of unspecified type of vessel, native or graft 2005   MI, s/p PCI with  stent (pt reports 20+ interventions in the past, most recent cat 6/08 showed patent stents).  Normal LV function.  Nuclear stress test NEG 8/09.  . Depression    ?bipolar dx by psychiatrist?  . Gouty arthritis    Dr. Amil Rollins  . Heart murmur   . Hematochezia 09/2012   Hyperplastic polyp, diverticulosis, and internal hemorrhoids found on colonoscopy 10/2012  . History of hiatal hernia   . Hyperkalemia 01/29/2017   Kayexalate 30 given x 1.  . Hyperlipidemia    Hx of multiple statin intolerance  . Hypertension   . 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 Metastasis from malignant neoplasm of prostate ([place])    "to spine & left hip; dx'd 01/21/2017"  . Migraine    "none in the 2000s" (01/15/2017)  . Mitral valve prolapse ~ 1981  . Neuropathic pain of both feet 2015/2016   Vit B12 and A1c normal 10/2014  . Obesities, morbid ([place])   . On home oxygen therapy    "2L prn" (01/15/2017)  . OSA  on CPAP    w/oxygen (01/15/2017)  . Osteoarthrosis, unspecified whether generalized or localized, unspecified site    DDD  . PAF (paroxysmal atrial fibrillation) ([place]) 12/2016   Started in the context of resp illness/lots of bronchodilators.  Started eliquis and cardizem during hospitalization 01/2017 (Dr. Rayann Heman).  . Pre-diabetes 2016/17  . Prostate cancer metastatic to multiple sites [facility]) 09/2010; 2018   2012: Localized, high risk disease (Dr. Kimbrough/[place]):  s/p 5 wks ext bm rad + seed boost, as well as hormone blockade x 1 yr.  Biochemical recurrence 11/2015;  2018 imaging showed bone mets and lymph node involvement.  Palliative Lupron and zytiga 2018/19--good PSA response as of 09/2017 onc f/u.  Mar[facility] Kitchen Radiation    Pelvic--for prostate ca: 25 treatments/01/2011  . Seizure disorder ([place]) 08-15-2011   Deja vu sensations: no w/u.  These stopped when neurontin 316m tid was started for a different reason.  . Status post chemotherapy    10/2010 thru 06/2011  . Tobacco dependence    still smoking as of  fall 2018.   Social History   Socioeconomic History  . Marital status: Married    Spouse name: [name]  . Number of children: Not on file  . Years of education: Not on file  . Highest education level: Not on file  Occupational History  . Occupation: Retired    EFish farm manager [employer]  Social Needs  . Financial resource strain: Not on file  . Food insecurity:    Worry: Not on file    Inability: Not on file  . Transportation needs:    Medical: Not on file    Non-medical: Not on file  Tobacco Use  . Smoking status: Former Smoker    Packs/day: 1.50    Years: 55.00    Pack years: 82.50    Types: Cigarettes  . Smokeless tobacco: Never Used  . Tobacco comment: 2 weeks ago  Substance and Sexual Activity  . Alcohol use: Not Currently    Comment: h/o heavy use - at least 6 daily, last use years ago  . Drug use: Yes    Types: Marijuana    Comment: 2-3 weeks ago last use  . Sexual activity: Never  Lifestyle  . Physical activity:    Days per week: Not on file    Minutes per session: Not on file  . Stress: Not on file  Relationships  . Social connections:    Talks on phone: Not on file    Gets together: Not on file    Attends religious service: Not on file    Active member of club or organization: Not on file    Attends meetings of clubs or organizations: Not on file    Relationship status: Not on file  Other Topics Concern  . Not on file  Social History Narrative   ** Merged History Encounter **   [place]  Problem Relation Age of Onset  . Arthritis Mother   . Heart disease Father   . Bipolar disorder Father    Scheduled Meds: . abiraterone acetate  1,000 mg Oral Daily  . apixaban  5 mg Oral BID  . aspirin EC  81 mg Oral Daily  . bisoprolol  10 mg Oral Daily  . citalopram  40 mg Oral QHS  . diltiazem  60 mg Oral Q12H  . docusate sodium  100 mg Oral BID  .  doxycycline  100 mg Oral  Q12H  . feeding supplement (ENSURE ENLIVE)  237 mL Oral TID BM  . furosemide  80 mg Intravenous Q12H  . gabapentin  300 mg Oral QHS  . ipratropium-albuterol  3 mL Nebulization TID  . lisinopril  10 mg Oral Daily  . nicotine  14 mg Transdermal Daily  . pantoprazole  40 mg Oral Daily  . predniSONE  40 mg Oral Q supper  . sodium chloride flush  3 mL Intravenous Q12H  . tamsulosin  0.4 mg Oral QPC supper   Continuous Infusions:  PRN Meds:.acetaminophen **OR** [DISCONTINUED] acetaminophen, albuterol, dextromethorphan-guaiFENesin, hydrALAZINE, ondansetron **OR** ondansetron (ZOFRAN) IV, oxyCODONE, prochlorperazine, traMADol Medications Prior to Admission:  Prior to Admission medications   Medication Sig Start Date End Date Taking? Authorizing Provider  abiraterone acetate (ZYTIGA) 250 MG tablet Take 4 tablets (1,000 mg total) by mouth daily. Take on an empty stomach 1 hour before or 2 hours after a meal 11/13/17  Yes Shadad, Mathis Dad, MD  albuterol (PROAIR HFA) 108 (90 Base) MCG/ACT inhaler Inhale 2 puffs into the lungs every 6 (six) hours as needed for wheezing or shortness of breath. 11/04/17  Yes [name], [name], MD  albuterol (PROVENTIL) (2.5 MG/3ML) 0.083% nebulizer solution Take 3 mLs (2.5 mg total) by nebulization every 4 (four) hours as needed for wheezing or shortness of breath. 12/02/17  Yes [name], [name], MD  allopurinol (ZYLOPRIM) 300 MG tablet Take 1 tablet (300 mg total) by mouth daily. Patient taking differently: Take 300 mg by mouth as needed (Gout).  12/17/16  Yes [name], [name], MD  apixaban (ELIQUIS) 5 MG TABS tablet Take 1 tablet (5 mg total) by mouth 2 (two) times daily. 09/28/17  Yes [name], [name], MD  azithromycin (ZITHROMAX) 250 MG tablet Take 2 tab x 1 day, then 1 tab daily x 4 days Patient taking differently: Take 250 mg by mouth daily. Take 2 tab x 1 day, then 1 tab daily x 4 days 12/18/17  Yes [name], [name], MD  bisoprolol (ZEBETA) 5 MG tablet Take 1 tablet (5  mg total) by mouth daily. 11/20/17  Yes Aline August, MD  budesonide-formoterol (SYMBICORT) 160-4.5 MCG/ACT inhaler Inhale 2 puffs into the lungs 2 (two) times daily. 01/28/17  Yes [name], [name], MD  citalopram (CELEXA) 40 MG tablet Take 1 tablet (40 mg total) by mouth at bedtime. 11/19/17  Yes Aline August, MD  dextromethorphan-guaiFENesin (MUCINEX DM) 30-600 MG 12hr tablet Take 1 tablet by mouth 2 (two) times daily as needed for cough.   Yes [provider]  diltiazem (CARDIZEM) 30 MG tablet Take 1 tablet (30 mg total) by mouth every 12 (twelve) hours. 04/02/17  Yes Fay Records, MD  feeding supplement, ENSURE ENLIVE, (ENSURE ENLIVE) LIQD Take 237 mLs by mouth 2 (two) times daily between meals. Patient taking differently: Take 237 mLs by mouth. 237 mls --Three to four times per day. 01/17/17  Yes Hosie Poisson, MD  furosemide (LASIX) 40 MG tablet Take 1 tablet (40 mg total) by mouth daily. Patient taking differently: Take 40 mg by mouth as needed for fluid.  12/02/17  Yes [name], [name], MD  gabapentin (NEURONTIN) 300 MG capsule Take 1 capsule (300 mg total) by mouth 2 (two) times daily. Patient taking differently: Take 300 mg by mouth at bedtime.  11/19/17  Yes Aline August, MD  lisinopril (PRINIVIL,ZESTRIL) 10 MG tablet Take 1 tablet (10 mg total) by mouth daily. 12/02/17  Yes [name], Arnette Norris  H, MD  Oxycodone HCl 10 MG TABS Take 1 tablet (10 mg total) by mouth every 8 (eight) hours as needed. Patient taking differently: Take 10 mg by mouth every 8 (eight) hours as needed.  11/05/17  Yes Wyatt Portela, MD  pantoprazole (PROTONIX) 40 MG tablet Take 1 tablet (40 mg total) by mouth daily. 04/16/17  Yes [name], [name], MD  predniSONE (DELTASONE) 10 MG tablet Take 40 mg by mouth daily. 12/18/17  Yes [provider]  Probiotic Product (PROBIOTIC PO) Take 1 capsule by mouth daily.   Yes [provider]  prochlorperazine (COMPAZINE) 10 MG tablet Take 1 tablet (10 mg total)  by mouth every 6 (six) hours as needed for nausea or vomiting. 11/13/17  Yes Wyatt Portela, MD  tamsulosin (FLOMAX) 0.4 MG CAPS capsule Take 1 capsule (0.4 mg total) by mouth daily after supper. Patient taking differently: Take 0.4 mg by mouth as needed (urination).  11/13/17  Yes [name], [name], MD  traMADol (ULTRAM) 50 MG tablet Take 1 tablet (50 mg total) by mouth every 6 (six) hours as needed. Patient taking differently: Take 50 mg by mouth every 6 (six) hours as needed for moderate pain or severe pain.  11/05/17  Yes Wyatt Portela, MD  urea (CARMOL) 40 % CREA Apply to right lower leg once daily 12/02/17  Yes [name], [name], MD  aspirin 325 MG EC tablet Take 325 mg by mouth daily.    [provider]  potassium chloride SA (K-DUR,KLOR-CON) 20 MEQ tablet Take 1 tablet (20 mEq total) by mouth daily. 11/05/17   Wyatt Portela, MD   Allergies  Allergen Reactions  . Amitriptyline Hcl Nausea Only    REACTION: nausea, elevated bp  . Ezetimibe Other (See Comments)    REACTION: chest pain, fatigue   Review of Systems  Constitutional: Positive for activity change and appetite change.  Respiratory: Positive for shortness of breath.   Neurological: Positive for weakness.   Physical Exam  Constitutional: He is oriented to person, place, and time. He is cooperative.  HENT:  Head: Normocephalic and atraumatic.  Cardiovascular: Regular rhythm.  Pulmonary/Chest: No accessory muscle usage. No tachypnea. No respiratory distress.  Off BiPAP on 4L [place]  Neurological: He is alert and oriented to person, place, and time.  Skin: Skin is warm and dry.  Psychiatric: He has a normal mood and affect. His speech is normal and behavior is normal. Cognition and memory are normal.  Nursing note and vitals reviewed.  Vital Signs: BP (!) 156/85 (BP Location: Right Arm)   Pulse 61   Temp 98.8 F (37.1 C) (Oral)   Resp 17   Ht '5\' 10"'  (1.778 m)   Wt 121.2 kg (267 lb 4.8 oz)   SpO2 90%   BMI 38.35  kg/m  Pain Scale: 0-10 POSS *See Group Information*: 1-Acceptable,Awake and alert Pain Score: 3    SpO2: SpO2: 90 % O2 Device:SpO2: 90 % O2 Flow Rate: .O2 Flow Rate (L/min): 4 L/min  IO: Intake/output summary:   Intake/Output Summary (Last 24 hours) at 12/23/2017 1029 Last data filed at 12/23/2017 0900 Gross per 24 hour  Intake 1433 ml  Output 2900 ml  Net -1467 ml    LBM: Last BM Date: 12/22/17 Baseline Weight: Weight: 122 kg (269 lb) Most recent weight: Weight: 121.2 kg (267 lb 4.8 oz)     Palliative Assessment/Data: PPS 50%   Flowsheet Rows     Most Recent Value  Intake Tab  Referral Department  Hospitalist  Unit at Time of Referral  ER  Palliative Care Primary Diagnosis  -- [prostate cancer, COPD, CHF]  Palliative Care Type  New Palliative care  Reason for referral  Clarify Goals of Care  Date first seen by Palliative Care  12/22/17  Clinical Assessment  Palliative Performance Scale Score  50%  Psychosocial & Spiritual Assessment  Palliative Care Outcomes  Patient/Family meeting held?  Yes  Who was at the meeting?  patient  Palliative Care Outcomes  Clarified goals of care, Counseled regarding hospice, ACP counseling assistance, Provided psychosocial or spiritual support, Provided end of life care assistance      Time In/Out: 1000-1110 Time Total:  70mn Greater than 50%  of this time was spent counseling and coordinating care related to the above assessment and plan.  Signed by:  MIhor Dow FNP-C Palliative Medicine Team  Phone: [phone]: [phone]  Please contact Palliative Medicine Team phone at [phone] questions and concerns.  For individual provider: See AShea Evans

## 2017-12-23 ENCOUNTER — Ambulatory Visit: Payer: Self-pay

## 2017-12-23 ENCOUNTER — Telehealth: Payer: Self-pay | Admitting: *Deleted

## 2017-12-23 DIAGNOSIS — G4733 Obstructive sleep apnea (adult) (pediatric): Secondary | ICD-10-CM

## 2017-12-23 DIAGNOSIS — I10 Essential (primary) hypertension: Secondary | ICD-10-CM

## 2017-12-23 DIAGNOSIS — J9621 Acute and chronic respiratory failure with hypoxia: Secondary | ICD-10-CM

## 2017-12-23 DIAGNOSIS — Z720 Tobacco use: Secondary | ICD-10-CM

## 2017-12-23 DIAGNOSIS — G894 Chronic pain syndrome: Secondary | ICD-10-CM

## 2017-12-23 DIAGNOSIS — C61 Malignant neoplasm of prostate: Secondary | ICD-10-CM

## 2017-12-23 DIAGNOSIS — E662 Morbid (severe) obesity with alveolar hypoventilation: Secondary | ICD-10-CM

## 2017-12-23 DIAGNOSIS — J9622 Acute and chronic respiratory failure with hypercapnia: Secondary | ICD-10-CM

## 2017-12-23 DIAGNOSIS — I5032 Chronic diastolic (congestive) heart failure: Secondary | ICD-10-CM

## 2017-12-23 DIAGNOSIS — C7951 Secondary malignant neoplasm of bone: Secondary | ICD-10-CM

## 2017-12-23 DIAGNOSIS — I482 Chronic atrial fibrillation: Secondary | ICD-10-CM

## 2017-12-23 DIAGNOSIS — J810 Acute pulmonary edema: Secondary | ICD-10-CM

## 2017-12-23 DIAGNOSIS — Z515 Encounter for palliative care: Secondary | ICD-10-CM

## 2017-12-23 LAB — CBC
HCT: 39 % (ref 39.0–52.0)
HEMOGLOBIN: 12.3 g/dL — AB (ref 13.0–17.0)
MCH: 27.7 pg (ref 26.0–34.0)
MCHC: 31.5 g/dL (ref 30.0–36.0)
MCV: 87.8 fL (ref 78.0–100.0)
PLATELETS: 255 10*3/uL (ref 150–400)
RBC: 4.44 MIL/uL (ref 4.22–5.81)
RDW: 14 % (ref 11.5–15.5)
WBC: 10.2 10*3/uL (ref 4.0–10.5)

## 2017-12-23 LAB — BASIC METABOLIC PANEL
ANION GAP: 7 (ref 5–15)
BUN: 33 mg/dL — ABNORMAL HIGH (ref 6–20)
CO2: 48 mmol/L — ABNORMAL HIGH (ref 22–32)
CREATININE: 0.92 mg/dL (ref 0.61–1.24)
Calcium: 8.5 mg/dL — ABNORMAL LOW (ref 8.9–10.3)
Chloride: 85 mmol/L — ABNORMAL LOW (ref 101–111)
Glucose, Bld: 115 mg/dL — ABNORMAL HIGH (ref 65–99)
Potassium: 4.4 mmol/L (ref 3.5–5.1)
SODIUM: 140 mmol/L (ref 135–145)

## 2017-12-23 MED ORDER — IPRATROPIUM-ALBUTEROL 0.5-2.5 (3) MG/3ML IN SOLN
3.0000 mL | Freq: Three times a day (TID) | RESPIRATORY_TRACT | Status: DC
  Administered 2017-12-23 – 2017-12-25 (×9): 3 mL via RESPIRATORY_TRACT
  Filled 2017-12-23 (×9): qty 3

## 2017-12-23 NOTE — Telephone Encounter (Signed)
Please advise. Thanks.  

## 2017-12-23 NOTE — Discharge Instructions (Signed)

## 2017-12-23 NOTE — Evaluation (Signed)
Occupational Therapy Evaluation Patient Details Name: Scott Rollins MRN: [id number] DOB: 1951/06/11 Today's Date: 12/23/2017    History of Present Illness Pt is a 67 year old man with PMH of prostate cancer with bone mets, COPD on 3L at home, seizure d/o, pre-DM, afib, OSA, morbid obesity, CAD, HTN admitted with acute on chronic respiratory failure requiring bipap in the ED.    Clinical Impression   Pt is assisted for LB bathing and dressing and all IADL. He has poor activity tolerance at baseline and walks in one small area of his home as it is multilevel. He presents with generalized weakness, reporting his knees buckling without warning. He requires +2 min assist with RW to ambulate for safety and lines. Pt's wife can offer limited assistance at home due to a recent fall with UE fx. Recommending ST rehab in SNF prior to return home, pt is in agreement.    Follow Up Recommendations  SNF;Supervision/Assistance - 24 hour    Equipment Recommendations  3 in 1 bedside commode    Recommendations for Other Services       Precautions / Restrictions Precautions Precautions: Fall;Other (comment)(instructed in back precautions for comfort 2* mets) Precaution Comments: fell 3 times in the last 2 months, reports knees buckle without warning.      Mobility Bed Mobility Overal bed mobility: Needs Assistance Bed Mobility: Rolling;Sidelying to Sit Rolling: Min guard Sidelying to sit: Min guard       General bed mobility comments: instructed to use log roll technique for comfort  Transfers Overall transfer level: Needs assistance Equipment used: Rolling walker (2 wheeled) Transfers: Sit to/from Stand Sit to Stand: Min assist         General transfer comment: increased time, cues for hand placement, steadying assist    Balance Overall balance assessment: Needs assistance   Sitting balance-Leahy Scale: Fair       Standing balance-Leahy Scale: Poor                              ADL either performed or assessed with clinical judgement   ADL Overall ADL's : Needs assistance/impaired Eating/Feeding: Independent;Sitting   Grooming: Wash/dry hands;Wash/dry face;Oral care;Brushing hair;Sitting;Set up   Upper Body Bathing: Minimal assistance;Sitting   Lower Body Bathing: Maximal assistance;Sit to/from stand   Upper Body Dressing : Minimal assistance;Sitting   Lower Body Dressing: Maximal assistance;Sit to/from stand   Toilet Transfer: Minimal assistance;Ambulation;RW   Toileting- Clothing Manipulation and Hygiene: Maximal assistance;Sit to/from stand       Functional mobility during ADLs: Minimal assistance;+2 for safety/equipment;Rolling walker       Vision Patient Visual Report: No change from baseline       Perception     Praxis      Pertinent Vitals/Pain Pain Assessment: 0-10 Pain Score: 7  Pain Location: back Pain Descriptors / Indicators: Discomfort;Constant Pain Intervention(s): Monitored during session;Premedicated before session;Repositioned     Hand Dominance Right   Extremity/Trunk Assessment Upper Extremity Assessment Upper Extremity Assessment: Overall WFL for tasks assessed   Lower Extremity Assessment Lower Extremity Assessment: Defer to PT evaluation   Cervical / Trunk Assessment Cervical / Trunk Assessment: Other exceptions Cervical / Trunk Exceptions: pt with bone mets, instructed in back precautions for comfort   Communication Communication Communication: No difficulties   Cognition Arousal/Alertness: Awake/alert Behavior During Therapy: WFL for tasks assessed/performed Overall Cognitive Status: Within Functional Limits for tasks assessed  General Comments       Exercises     Shoulder Instructions      Home Living Family/patient expects to be discharged to:: Private residence Living Arrangements: Spouse/significant other;Children(daughter is  also staying with them) Available Help at Discharge: Family;Available 24 hours/day Type of Home: House Home Access: Stairs to enter [employer] of Steps: 3 Entrance Stairs-Rails: Right Home Layout: Multi-level Alternate Level Stairs-Number of Steps: 6 or 9 steps to get to other floors of home Alternate Level Stairs-Rails: Right Bathroom Shower/Tub: Tub/shower unit   Bathroom Toilet: Handicapped height     Home Equipment: Environmental consultant - 2 wheels;Cane - single point;Shower seat;Walker - 4 wheels;Crutches;Other (comment);Adaptive equipment(02) Adaptive Equipment: Reacher Additional Comments: pt is fairly sedentary, likes to be outside, wife with recent fall and UE fx      Prior Functioning/Environment Level of Independence: Needs assistance  Gait / Transfers Assistance Needed: Used RW and cane as needed but limited ambulation - pt fatigues easily ADL's / Homemaking Assistance Needed: assist for LB dressing and bathing and IADL            OT Problem List: Decreased strength;Decreased activity tolerance;Impaired balance (sitting and/or standing);Decreased knowledge of use of DME or AE;Cardiopulmonary status limiting activity;Pain;Obesity      OT Treatment/Interventions: Self-care/ADL training;DME and/or AE instruction;Therapeutic activities;Patient/family education;Balance training    OT Goals(Current goals can be found in the care plan section) Acute Rehab OT Goals Patient Stated Goal: to get stronger before going back home OT Goal Formulation: With patient Time For Goal Achievement: 01/06/18 Potential to Achieve Goals: Good ADL Goals Pt Will Perform Upper Body Dressing: with supervision;sitting Pt Will Perform Lower Body Dressing: with min assist;with adaptive equipment;sit to/from stand Pt Will Transfer to Toilet: with min guard assist;ambulating;bedside commode(over toilet) Pt Will Perform Toileting - Clothing Manipulation and hygiene: with min assist;sit to/from  stand Additional ADL Goal #1: Pt will utilize energy conservation and breathing techniques during [place] supervision.  OT Frequency: Min 2X/week   Barriers to D/C:            Co-evaluation PT/OT/SLP Co-Evaluation/Treatment: Yes Reason for Co-Treatment: For patient/therapist safety   OT goals addressed during session: ADL's and self-care      AM-PAC PT "6 Clicks" Daily Activity     Outcome Measure Help from another person eating meals?: None Help from another person taking care of personal grooming?: A Little Help from another person toileting, which includes using toliet, bedpan, or urinal?: A Lot Help from another person bathing (including washing, rinsing, drying)?: A Lot Help from another person to put on and taking off regular upper body clothing?: A Little Help from another person to put on and taking off regular lower body clothing?: A Lot 6 Click Score: 16   End of Session Equipment Utilized During Treatment: Gait belt;Rolling walker;Oxygen(4L) Nurse Communication: Mobility status  Activity Tolerance: Patient limited by fatigue Patient left: in chair;with call bell/phone within reach  OT Visit Diagnosis: Unsteadiness on feet (R26.81);Other abnormalities of gait and mobility (R26.89);Pain;History of falling (Z91.81);Muscle weakness (generalized) (M62.81)                Time: 6237-6283 OT Time Calculation (min): 36 min Charges:  OT General Charges $OT Visit: 1 Visit OT Evaluation $OT Eval Moderate Complexity: 1 Mod G-Codes:     Malka So 12/23/2017, 12:08 PM  12/23/2017 Nestor Lewandowsky, OTR/L Pager: [phone]

## 2017-12-23 NOTE — Progress Notes (Signed)
PROGRESS NOTE    Scott Rollins  OZH:[id number] DOB: 23-Dec-1950 DOA: 12/21/2017 PCP: [name], MD   Brief Narrative:  67 y.o.WM PMHx  COPD with ongoing tobacco use on home O2 (3L all times); seizure d/o; metastatic prostate CA; pre-DM; afib; OSA on CPAP; morbid obesity; CAD; HTN; HLD; and CAD   Presenting with respiratory distress and confusion.        Subjective: 6/12 A/O x4, negative CP, positive chronic S OB, negative abdominal pain.  Patient on CPAP at home states his machine is 68 years old and would like to have it replaced with an updated version.  Does not recall when his last sleep study occurred but sometime ago.     Assessment & Plan:   Principal Problem:   Acute on chronic respiratory failure with hypoxia and hypercapnia (HCC) Active Problems:   Essential hypertension   TOBACCO ABUSE   Chronic pain syndrome   OSA (obstructive sleep apnea)   Atrial fibrillation (HCC)   Prostate cancer metastatic to bone (HCC)   Acute exacerbation of chronic obstructive pulmonary disease (COPD) (HCC)   Acute exacerbation of CHF (congestive heart failure) (HCC)  Acute on chronic respiratory failure with hypoxia/hypercapnia/COPD exacerbation - Titrate O2 to maintain SPO2 89 to 93% - CPAP at night.  BiPAP if required -Mucinex DM - Complete current course of antibiotics - DuoNeb TID  -Prednisone 40 mg daily  OSA/obesity hypoventilation syndrome -See respiratory failure -Counseled patient that most likely will not be able to obtain replacement CPAP machine without updated sleep study however I spoken to [facility] Kristi who will inquire, and to weigh around department for new sleep study given patient's terminal cancer.   Tobacco abuse - Patient stated discontinued 2 weeks ago? - Nicotine patch -Counseled patient on absolute need to refrain from future smoking.  Chronic diastolic CHF (dry weight 629 kg) - Strict in and out since admission -6.7 L -Daily weight Filed  Weights   12/21/17 0529 12/23/17 0600  Weight: 269 lb (122 kg) 267 lb 4.8 oz (121.2 kg)  -Currently at dry weight - Transfuse for hemoglobin<8  - 81 mg daily - Cardizem 60 mg BID -Hydralazine PRN  Essential HTN -See CHF  Chronic atrial fibrillation - Currently rate controlled - Eliquis  Chronic pain syndrome - Secondary to metastatic prostate cancer to bone -According to the [place] Controlled Substances Reporting System he is receiving medications from only one provider and appears to be taking them as prescribed    Metastatic prostate cancer (to bones) - Diagnosed 2012 metastatic prostate CA -receiving palliative therapy by Dr. Alen Blew.  Oncology -Appears very realistic about his prognosis understanding that he is in his terminal phase.  To remain full code during this admission         Goals of care - 6/12 PALLIATIVE CARE:COPD with ongoing tobacco use on home O2 (3L all times); seizure d/o; metastatic prostate CA; pre-DM; afib; OSA on CPAP Evaluate for change of CODE STATUS to DNR, short-term vs long-term goals of care.   DVT prophylaxis: Eliquis Code Status: Full Family Communication: None Disposition Plan: TBD most likely home   Consultants:  None     Procedures/Significant Events:  None   I have personally reviewed and interpreted all radiology studies and my findings are as above.  VENTILATOR SETTINGS: BiPAP   Cultures   Antimicrobials: Anti-infectives (From admission, onward)   Start     Stop   12/23/17 1000  doxycycline (VIBRA-TABS) tablet 100 mg  12/21/17 1500  doxycycline (VIBRAMYCIN) 100 mg in sodium chloride 0.9 % 250 mL IVPB     12/22/17 1833         Devices    LINES / TUBES:        Continuous Infusions:   Objective: Vitals:   12/23/17 0600 12/23/17 0749 12/23/17 0752 12/23/17 0807  BP: (!) 154/83   (!) 156/85  Pulse: (!) 59 (!) 55  61  Resp: 20 13 14 17   Temp: 98.2 F (36.8 C)   98.8 F (37.1 C)  TempSrc:  Axillary   Oral  SpO2: 97% 94%  90%  Weight: 267 lb 4.8 oz (121.2 kg)     Height:        Intake/Output Summary (Last 24 hours) at 12/23/2017 0931 Last data filed at 12/23/2017 0810 Gross per 24 hour  Intake 1213 ml  Output 4900 ml  Net -3687 ml   Filed Weights   12/21/17 0529 12/23/17 0600  Weight: 269 lb (122 kg) 267 lb 4.8 oz (121.2 kg)    Examination:  General: A/O x4, positive acute on chronic respiratory distress Neck:  Negative scars, masses, torticollis, lymphadenopathy, JVD Lungs: Clear to auscultation bilaterally without wheezes or crackles Cardiovascular: Regular rate and rhythm without murmur gallop or rub normal S1 and S2 Abdomen: Morbidly obese, negative abdominal pain, nondistended, positive soft, bowel sounds, no rebound, no ascites, no appreciable mass Extremities: No significant cyanosis, clubbing, or edema bilateral lower extremities Skin: Negative rashes, lesions, ulcers Psychiatric:  Negative depression, negative anxiety, negative fatigue, negative mania  Central nervous system:  Cranial nerves II through XII intact, tongue/uvula midline, all extremities muscle strength 5/5, sensation intact throughout, negative dysarthria, negative expressive aphasia, negative receptive aphasia.  .     Data Reviewed: Care during the described time interval was provided by me .  I have reviewed this patient's available data, including medical history, events of note, physical examination, and all test results as part of my evaluation.   CBC: Recent Labs  Lab 12/16/17 1510 12/21/17 0531 12/22/17 0535 12/23/17 0410  WBC 7.0 13.0* 7.4 10.2  NEUTROABS 5.1 11.2*  --   --   HGB 13.0 13.5 12.0* 12.3*  HCT 42.7 46.7 39.1 39.0  MCV 92.6 96.1 90.7 87.8  PLT 211 267 223 315   Basic Metabolic Panel: Recent Labs  Lab 12/16/17 1510 12/21/17 0531 12/22/17 0535 12/23/17 0410  NA 142 143 141 140  K 3.6 4.7 3.7 4.4  CL 95* 94* 88* 85*  CO2 37* 41* 46* 48*  GLUCOSE 120 132*  118* 115*  BUN 12 14 18  33*  CREATININE 0.80 0.76 0.88 0.92  CALCIUM 9.5 8.6* 8.7* 8.5*   GFR: Estimated Creatinine Clearance: 100.3 mL/min (by C-G formula based on SCr of 0.92 mg/dL). Liver Function Tests: Recent Labs  Lab 12/16/17 1510  AST 18  ALT 12  ALKPHOS 112  BILITOT 0.5  PROT 6.6  ALBUMIN 3.3*   No results for input(s): LIPASE, AMYLASE in the last 168 hours. No results for input(s): AMMONIA in the last 168 hours. Coagulation Profile: No results for input(s): INR, PROTIME in the last 168 hours. Cardiac Enzymes: Recent Labs  Lab 12/21/17 0531  TROPONINI <0.03   BNP (last 3 results) No results for input(s): PROBNP in the last 8760 hours. HbA1C: No results for input(s): HGBA1C in the last 72 hours. CBG: No results for input(s): GLUCAP in the last 168 hours. Lipid Profile: No results for input(s): CHOL, HDL, LDLCALC, TRIG, CHOLHDL,  LDLDIRECT in the last 72 hours. Thyroid Function Tests: No results for input(s): TSH, T4TOTAL, FREET4, T3FREE, THYROIDAB in the last 72 hours. Anemia Panel: No results for input(s): VITAMINB12, FOLATE, FERRITIN, TIBC, IRON, RETICCTPCT in the last 72 hours. Urine analysis:    Component Value Date/Time   COLORURINE YELLOW 01/21/2017 1401   APPEARANCEUR HAZY (A) 01/21/2017 1401   LABSPEC 1.028 01/21/2017 1401   PHURINE 5.0 01/21/2017 1401   GLUCOSEU NEGATIVE 01/21/2017 1401   HGBUR SMALL (A) 01/21/2017 1401   HGBUR negative 08/21/2010 0831   BILIRUBINUR NEGATIVE 01/21/2017 1401   KETONESUR NEGATIVE 01/21/2017 1401   PROTEINUR NEGATIVE 01/21/2017 1401   UROBILINOGEN 0.2 08/21/2010 0831   NITRITE NEGATIVE 01/21/2017 1401   LEUKOCYTESUR NEGATIVE 01/21/2017 1401   Sepsis Labs: @LABRCNTIP (procalcitonin:4,lacticidven:4)  ) Recent Results (from the past 240 hour(s))  MRSA PCR Screening     Status: None   Collection Time: 12/22/17  4:20 PM  Result Value Ref Range Status   MRSA by PCR NEGATIVE NEGATIVE Final    Comment:        The  GeneXpert MRSA Assay (FDA approved for NASAL specimens only), is one component of a comprehensive MRSA colonization surveillance program. It is not intended to diagnose MRSA infection nor to guide or monitor treatment for MRSA infections. Performed at [facility], [place] [address], [place],  [address]          Radiology Studies: No results found.      Scheduled Meds: . abiraterone acetate  1,000 mg Oral Daily  . apixaban  5 mg Oral BID  . aspirin EC  81 mg Oral Daily  . bisoprolol  10 mg Oral Daily  . citalopram  40 mg Oral QHS  . diltiazem  60 mg Oral Q12H  . docusate sodium  100 mg Oral BID  . doxycycline  100 mg Oral Q12H  . feeding supplement (ENSURE ENLIVE)  237 mL Oral TID BM  . furosemide  80 mg Intravenous Q12H  . gabapentin  300 mg Oral QHS  . ipratropium-albuterol  3 mL Nebulization TID  . lisinopril  10 mg Oral Daily  . nicotine  14 mg Transdermal Daily  . pantoprazole  40 mg Oral Daily  . predniSONE  40 mg Oral Q supper  . sodium chloride flush  3 mL Intravenous Q12H  . tamsulosin  0.4 mg Oral QPC supper   Continuous Infusions:   LOS: 2 days    Time spent: 40 minutes    WOODS, Geraldo Docker, MD [facility] Pager [phone]   If 7PM-7AM, please contact night-coverage [email or web address] Password [facility] 12/23/2017, 9:31 AM

## 2017-12-23 NOTE — Progress Notes (Signed)
Nutrition Brief Note  Patient identified on the Malnutrition Screening Tool (MST) Report  Wt Readings from Last 15 Encounters:  12/23/17 267 lb 4.8 oz (121.2 kg)  12/02/17 269 lb (122 kg)  11/17/17 276 lb 7.3 oz (125.4 kg)  11/05/17 272 lb 3.2 oz (123.5 kg)  09/29/17 274 lb 8 oz (124.5 kg)  08/13/17 279 lb 1.6 oz (126.6 kg)  06/26/17 274 lb 12.8 oz (124.6 kg)  05/07/17 272 lb 14.4 oz (123.8 kg)  04/16/17 277 lb (125.6 kg)  04/02/17 273 lb 6.4 oz (124 kg)  03/25/17 278 lb 3.2 oz (126.2 kg)  02/09/17 274 lb 3.2 oz (124.4 kg)  01/28/17 277 lb 12 oz (126 kg)  01/24/17 272 lb 9.6 oz (123.7 kg)  01/17/17 268 lb 8 oz (121.8 kg)   67 y.o.malewith a history ofCOPD with ongoing tobacco use on home O2 (3L all times); seizure d/o; metastatic prostate CA; pre-DM; afib; OSA on CPAP; morbid obesity; CHF; HTN; HLD; and CAD who presenting with respiratory distress and confusion  Patient was eating 2-3 meals per day at home, appetite was good. Exhibits some weight fluctuations over the past year, but this is likely related to his CHF and volume status. Upon previous admission he was diuresed with 80mg  IV Lasix BID. Current weight loss over the past 6 months is 4%, insignificant for timeframe.  Body mass index is 38.35 kg/m. Patient meets criteria for obese class II based on current BMI.   Current diet order is regular, patient is consuming approximately 100% of meals at this time. Labs and medications reviewed.   No nutrition interventions warranted at this time. If nutrition issues arise, please consult RD.   Satira Anis. Hancel Ion, MS, RD LDN Inpatient Clinical Dietitian Pager [phone]

## 2017-12-23 NOTE — Progress Notes (Signed)
Daily Progress Note   Patient Name: Scott Rollins       Date: 12/23/2017 DOB: 06/04/1951  Age: 67 y.o. MRN#: [id number] Attending Physician: Allie Bossier, MD Primary Care Physician: [name], MD Admit Date: 12/21/2017  Reason for Consultation/Follow-up: Establishing goals of care  Subjective/GOC: Patient awake, alert, oriented. Almost time for prn oxycodone for chronic bone pain. He tells me pain is relieved when he takes prn oxycodone. Feels comfortable off BiPAP and on [place].   Follow-up from my conversation with Scott Rollins yesterday. Again, he does seem to have a good understanding of chronic, progressive nature of COPD and CHF. Also that his cancer is uncurable and treatment is palliative. Knowing this, he still wishes for FULL code/FULL scope treatment at this time.   I reviewed MOST form and left Hard Choices copy at bedside for patient and his wife. He also has advanced directive packet at bedside. Encouraged Scott Rollins to discuss EOL wishes with his wife and children.   Also, reiterated on our conversation about hospice services yesterday. [place] ([place] at [place]) but is worried about not having his doctors close. I encouraged him to consider support from palliative/hospice services, with focus on symptom management and preventing hospitalization. [facility] provides hospice services in [place]. I provided patient contact number for [facility] representative.   **Also spoke with patient's wife ([name]) via telephone. Introduced role of palliative medicine. Discussed hospital diagnoses, interventions, and underlying co-morbidities. Educated on disease trajectories of COPD/CHF as well as typical cancer trajectories. She speaks of  him doing well on Zytiga and wishing to continue. He missed his f/u oncology appointment since he is hospitalized.   Discussed concern with disease progression of COPD/CHF with recurrent hospitalization. She does speak of him "getting better" and then home for 5 days and not able to breath again. Explained role of symptom management in individuals with chronic, progressive disease. Educated on palliative/hospice outpatient options including possibly enrolling in hospice services if/when they go to [facility].   I explained the MOST form and Hard Choices copy that I left at bedside for her and her husband to review. She tells me he "does not want to be resuscitated." I explained to her that he has changed his mind this hospitalization and  wishes to be resuscitated and placed on life support if necessary. She is relieved by this and states "oh good, he's not giving up." I encouraged her to discuss big decisions they are faced with (including heroic interventions) as his cancer as well as COPD/CHF progress.   Wife tells me he has a CPAP at home but it is 17+ years old. He missed his sleep study because of this hospitalization. She is wondering if he could receive CPAP when discharged. I explained this would likely not be possible without sleep study but that I would notify attending of her concerns.   Questions/concerns addressed. PMT contact information given to wife and patient.    Length of Stay: 2  Current Medications: Scheduled Meds:  . abiraterone acetate  1,000 mg Oral Daily  . apixaban  5 mg Oral BID  . aspirin EC  81 mg Oral Daily  . bisoprolol  10 mg Oral Daily  . citalopram  40 mg Oral QHS  . diltiazem  60 mg Oral Q12H  . docusate sodium  100 mg Oral BID  . doxycycline  100 mg Oral Q12H  . feeding supplement (ENSURE ENLIVE)  237 mL Oral TID BM  . furosemide  80 mg Intravenous Q12H  . gabapentin  300 mg Oral QHS  . ipratropium-albuterol  3 mL Nebulization TID  . lisinopril  10 mg  Oral Daily  . nicotine  14 mg Transdermal Daily  . pantoprazole  40 mg Oral Daily  . predniSONE  40 mg Oral Q supper  . sodium chloride flush  3 mL Intravenous Q12H  . tamsulosin  0.4 mg Oral QPC supper    Continuous Infusions:   PRN Meds: acetaminophen **OR** [DISCONTINUED] acetaminophen, albuterol, dextromethorphan-guaiFENesin, hydrALAZINE, ondansetron **OR** ondansetron (ZOFRAN) IV, oxyCODONE, prochlorperazine, traMADol  Physical Exam  Constitutional: He is oriented to person, place, and time. He is cooperative.  HENT:  Head: Normocephalic and atraumatic.  Pulmonary/Chest: No accessory muscle usage. No tachypnea. No respiratory distress.  Comfortable off BiPAP on [place]  Neurological: He is alert and oriented to person, place, and time.  Skin: Skin is warm and dry.  Psychiatric: He has a normal mood and affect. His speech is normal and behavior is normal. Cognition and memory are normal.  Nursing note and vitals reviewed.          Vital Signs: BP (!) 146/93 (BP Location: Right Arm)   Pulse (!) 55   Temp 98.2 F (36.8 C) (Oral)   Resp 16   Ht 5\' 10"  (1.778 m)   Wt 121.2 kg (267 lb 4.8 oz)   SpO2 95%   BMI 38.35 kg/m  SpO2: SpO2: 95 % O2 Device: O2 Device: Bi-PAP O2 Flow Rate: O2 Flow Rate (L/min): 4 L/min  Intake/output summary:   Intake/Output Summary (Last 24 hours) at 12/23/2017 1528 Last data filed at 12/23/2017 1500 Gross per 24 hour  Intake 1573 ml  Output 3500 ml  Net -1927 ml   LBM: Last BM Date: 12/22/17 Baseline Weight: Weight: 122 kg (269 lb) Most recent weight: Weight: 121.2 kg (267 lb 4.8 oz)       Palliative Assessment/Data: PPS 50%   Flowsheet Rows     Most Recent Value  Intake Tab  Referral Department  Hospitalist  Unit at Time of Referral  ER  Palliative Care Primary Diagnosis  -- [prostate cancer, COPD, CHF]  Palliative Care Type  New Palliative care  Reason for referral  Clarify Goals of Care  Date first seen  by Palliative Care  12/22/17   Clinical Assessment  Palliative Performance Scale Score  50%  Psychosocial & Spiritual Assessment  Palliative Care Outcomes  Patient/Family meeting held?  Yes  Who was at the meeting?  patient  Palliative Care Outcomes  Clarified goals of care, Counseled regarding hospice, ACP counseling assistance, Provided psychosocial or spiritual support, Provided end of life care assistance      Patient Active Problem List   Diagnosis Date Noted  . Palliative care by specialist   . Acute pulmonary edema ([place])   . Acute on chronic respiratory failure with hypoxia and hypercapnia ([place]) 12/21/2017  . Acute exacerbation of CHF (congestive heart failure) ([place]) 12/21/2017  . Port-A-Cath in place 12/16/2017  . COPD exacerbation ([place]) 11/16/2017  . Acute hypercapnic respiratory failure ([place]) 11/16/2017  . Prostate cancer metastatic to bone ([place])   . Abnormal CT of the chest 01/21/2017  . Obesity hypoventilation syndrome ([place])   . Atrial fibrillation ([place]) 01/15/2017  . Community acquired pneumonia of right lower lobe of lung ([place]) 11/05/2016  . Hyponatremia 11/05/2016  . OSA (obstructive sleep apnea) 11/05/2016  . 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 COPD (chronic obstructive pulmonary disease) ([place]) 04/19/2014  . Chronic pain syndrome 01/11/2014  . Chronic gout 04/22/2013  . Goals of care, counseling/discussion 04/29/2012  . Mixed hyperlipidemia 08/27/2010  . ELEVATED PROSTATE SPECIFIC ANTIGEN 08/26/2010  . 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 HEMATURIA, HX OF 08/21/2010  . TOBACCO ABUSE 07/11/2010  . NUMMULAR ECZEMA 06/24/2010  . DYSPNEA 04/10/2009  . OBESITY 04/09/2009  . Essential hypertension 04/09/2009  . Coronary artery disease due to lipid rich plaque 04/09/2009  . DEGENERATIVE JOINT DISEASE 04/09/2009    Palliative Care Assessment & Plan   Patient Profile: 67 y.o. male  with past medical history of COPD on home oxygen 3L, smoker, CHF, metastatic prostate cancer, afib, DM,  seizures, OSA, CAD, HTN, HLD, and obesity admitted on 12/21/2017 with respiratory distress. Recent admission for COPD/CHF. ECHO revealed preserved ejection fraction and grade 1 diastolic dysfunction. In ED, patient placed on BiPAP with improvement in respiratory status. Chest xray negative for pneumonia, worsening heart failure. CO2 114. Lasix 80mg  given. Palliative medicine consultation for goals of care.   Assessment: Acute on chronic respiratory failure COPD exacerbation Diastolic CHF Metastatic prostate cancer Chronic cancer related pain Tobacco dependence OSA Chronic afib  Recommendations/Plan:  FULL code/FULL scope  Patient/family plan to continue f/u with outpatient oncology.   Continue home pain regimen. Patient may benefit from low-dose OxyContin for chronic bone pain. Per patient, Dr. Alen Blew manages opioids outpatient.   Introduced role of palliative medicine to wife via telephone. MOST form and HARD CHOICES copy left at bedside. Encouraged patient and wife to discuss and consider big decisions regarding heroic interventions with progressive COPD/CHF and cancer diagnosis.   Wife asking about home CPAP/BiPAP. Will likely need sleep study.   PMT will follow.   Goals of Care and Additional Recommendations:  Limitations on Scope of Treatment: Full Scope Treatment  Code Status: FULL   Code Status Orders  (From admission, onward)        Start     Ordered   12/22/17 1655  Full code  Continuous     12/22/17 1654    Code Status History    Date Active Date Inactive Code Status Order ID Comments User Context   12/21/2017 1422 12/22/2017 1654 DNR [id number]  Karmen Bongo, MD ED   11/16/2017 1914 11/19/2017 1951 Full Code [id number]  Reyne Dumas,  MD ED   01/21/2017 1639 01/25/2017 1501 Full Code [id number]  Radene Gunning, NP ED   01/15/2017 0823 01/17/2017 1935 Full Code [id number]  Elease Hashimoto ED   11/05/2016 0146 11/07/2016 1800 Full Code [id number]  Edwin Dada,  MD Inpatient       Prognosis:   Unable to determine:guarded with acute on chronic respiratory failure secondary to COPD and CHF. Also with progressive metastatic prostate cancer.   Discharge Planning:  To Be Determined  Care plan was discussed with patient, RN, wife  Thank you for allowing the Palliative Medicine Team to assist in the care of this patient.   Time In: 1400- 1535- Time Out: [id number] Total Time 64min Prolonged Time Billed  no      Greater than 50%  of this time was spent counseling and coordinating care related to the above assessment and plan.  Ihor Dow, FNP-C Palliative Medicine Team  Phone: [phone] Fax: [phone]  Please contact Palliative Medicine Team phone at [phone] for questions and concerns.

## 2017-12-23 NOTE — Care Management Note (Signed)
Case Management Note  Patient Details  Name: Scott Rollins MRN: [id number] Date of Birth: 1951-05-19  Subjective/Objective:    Acute resp failure with hypoxia and hypercapnia, HTN, COPD exac, CHF, tobacco use, afib, prostate cancer mets bone                Action/Plan: 12/23/2017 1230 pm Spoke to pt at bedside. Pt requesting SNF rehab. He has oxygen, neb machine, CPAP, Rolling Walker, elevated commode seat at home. Pt states his CPAP is 68 years old and not sure if works properly. He has not seen a pulmonologist in years. His last sleep study was 10 years ago.   12/23/2017 5:25 pm Received call from attending requesting follow up with on how pt can receive a new CPAP. Pt will need a outpt Pulmonology follow up appt and currently sleep study. Pt can be evaluated or qualify for Trilogy/Bipap with current diagnosis and ABG. Will continue to follow for dc needs.    Expected Discharge Date:                Expected Discharge Plan:  Skilled Nursing Facility  In-House Referral:  Clinical Social Work  Discharge planning Services  CM Consult  Post Acute Care Choice:  NA Choice offered to:  NA  DME Arranged:  Other see comment DME Agency:  NA  HH Arranged:  NA HH Agency:  NA  Status of Service:  Completed, signed off  If discussed at [place] of Stay Meetings, dates discussed:    Additional Comments:  Erenest Rasher, RN 12/23/2017, 6:59 PM

## 2017-12-23 NOTE — Progress Notes (Signed)
Pt instructed on flutter valve to help mobilize secretions.  Patient tried device and understands how it works.  Patient is to try exercises while awake as tolerated to generate a more productive cough.

## 2017-12-23 NOTE — Evaluation (Signed)
Physical Therapy Evaluation Patient Details Name: Scott Rollins MRN: [id number] DOB: Jan 19, 1951 Today's Date: 12/23/2017   History of Present Illness  Pt is a 67 year old man with PMH of prostate cancer with bone mets, COPD on 3L at home, seizure d/o, pre-DM, afib, OSA, morbid obesity, CAD, HTN admitted with acute on chronic respiratory failure requiring bipap in the ED.   Clinical Impression  Pt admitted with above diagnosis. Pt currently with functional limitations due to the deficits listed below (see PT Problem List). Pt was able to ambulate with RW with close min assist as he does have knee instability and multiple falls at home.  Had to follow closely with chair.  Sats down to 88% on 4LO2 with activity.  92-94% at rest on 4L. Pt needs to build endurance prior to d/c home. SNF recommended. Pt will benefit from skilled PT to increase their independence and safety with mobility to allow discharge to the venue listed below.      Follow Up Recommendations SNF;Supervision/Assistance - 24 hour    Equipment Recommendations  None recommended by PT    Recommendations for Other Services       Precautions / Restrictions Precautions Precautions: Fall;Other (comment)(instructed in back precautions for comfort 2* mets) Precaution Comments: fell 3 times in the last 2 months, reports knees buckle without warning. Restrictions Weight Bearing Restrictions: No      Mobility  Bed Mobility Overal bed mobility: Needs Assistance Bed Mobility: Rolling;Sidelying to Sit Rolling: Min guard Sidelying to sit: Min guard       General bed mobility comments: instructed to use log roll technique for comfort  Transfers Overall transfer level: Needs assistance Equipment used: Rolling walker (2 wheeled) Transfers: Sit to/from Stand Sit to Stand: Min assist         General transfer comment: increased time, cues for hand placement, steadying assist  Ambulation/Gait Ambulation/Gait assistance:  Min assist;+2 safety/equipment Ambulation Distance (Feet): 30 Feet Assistive device: Rolling walker (2 wheeled) Gait Pattern/deviations: Step-through pattern;Decreased stride length;Antalgic;Drifts right/left;Trunk flexed;Wide base of support;Shuffle   Gait velocity interpretation: <1.31 ft/sec, indicative of household ambulator General Gait Details: Pt ambulated with RW and did not buckle or lose balance but knee instability noted with ambulation.   Cannot withstand challenges to balance.   Stairs            Wheelchair Mobility    Modified Rankin (Stroke Patients Only)       Balance Overall balance assessment: Needs assistance Sitting-balance support: No upper extremity supported;Feet supported Sitting balance-Leahy Scale: Fair     Standing balance support: Bilateral upper extremity supported;During functional activity Standing balance-Leahy Scale: Poor Standing balance comment: relies on UEs for support.                              Pertinent Vitals/Pain Pain Assessment: 0-10 Pain Score: 7  Pain Location: back Pain Descriptors / Indicators: Discomfort;Constant Pain Intervention(s): Limited activity within patient's tolerance;Monitored during session;Premedicated before session;Repositioned    Home Living Family/patient expects to be discharged to:: Private residence Living Arrangements: Spouse/significant other;Children(daughter is also staying with them) Available Help at Discharge: Family;Available 24 hours/day Type of Home: House Home Access: Stairs to enter Entrance Stairs-Rails: Right Entrance Stairs-Number of Steps: 3 Home Layout: Multi-level Home Equipment: Walker - 2 wheels;Cane - single point;Shower seat;Walker - 4 wheels;Crutches;Other (comment);Adaptive equipment(02) Additional Comments: pt is fairly sedentary, likes to be outside, wife with recent fall and UE fx  Prior Function Level of Independence: Needs assistance   Gait / Transfers  Assistance Needed: Used RW and cane as needed but limited ambulation - pt fatigues easily  ADL's / Homemaking Assistance Needed: assist for LB dressing and bathing and IADL  Comments: occasional use of cane and RW     Hand Dominance   Dominant Hand: Right    Extremity/Trunk Assessment   Upper Extremity Assessment Upper Extremity Assessment: Defer to OT evaluation    Lower Extremity Assessment Lower Extremity Assessment: RLE deficits/detail;LLE deficits/detail RLE Deficits / Details: grossly 3-/5 LLE Deficits / Details: grossly 3-/5    Cervical / Trunk Assessment Cervical / Trunk Assessment: Other exceptions Cervical / Trunk Exceptions: pt with bone mets, instructed in back precautions for comfort  Communication   Communication: No difficulties  Cognition Arousal/Alertness: Awake/alert Behavior During Therapy: WFL for tasks assessed/performed Overall Cognitive Status: Within Functional Limits for tasks assessed                                        General Comments      Exercises General Exercises - Lower Extremity Ankle Circles/Pumps: AROM;Both;10 reps;Seated Long Arc Quad: AROM;Both;10 reps;Seated Hip Flexion/Marching: AROM;Both;10 reps;Seated   Assessment/Plan    PT Assessment Patient needs continued PT services  PT Problem List Decreased strength;Decreased activity tolerance;Decreased balance;Decreased mobility;Decreased knowledge of use of DME;Decreased safety awareness;Decreased knowledge of precautions;Pain;Obesity       PT Treatment Interventions DME instruction;Gait training;Functional mobility training;Therapeutic activities;Therapeutic exercise;Balance training;Patient/family education;Stair training    PT Goals (Current goals can be found in the Care Plan section)  Acute Rehab PT Goals Patient Stated Goal: to get stronger before going back home PT Goal Formulation: With patient Time For Goal Achievement: 01/06/18 Potential to  Achieve Goals: Good    Frequency Min 3X/week   Barriers to discharge Decreased caregiver support(wife just broke her wrist)      Co-evaluation PT/OT/SLP Co-Evaluation/Treatment: Yes Reason for Co-Treatment: For patient/therapist safety PT goals addressed during session: Mobility/safety with mobility OT goals addressed during session: ADL's and self-care       AM-PAC PT "6 Clicks" Daily Activity  Outcome Measure Difficulty turning over in bed (including adjusting bedclothes, sheets and blankets)?: A Little Difficulty moving from lying on back to sitting on the side of the bed? : A Little Difficulty sitting down on and standing up from a chair with arms (e.g., wheelchair, bedside commode, etc,.)?: A Little Help needed moving to and from a bed to chair (including a wheelchair)?: A Little Help needed walking in hospital room?: A Little Help needed climbing 3-5 steps with a railing? : A Lot 6 Click Score: 17    End of Session Equipment Utilized During Treatment: Gait belt;Oxygen Activity Tolerance: Patient limited by fatigue Patient left: in chair;with call bell/phone within reach Nurse Communication: Mobility status PT Visit Diagnosis: Unsteadiness on feet (R26.81);Muscle weakness (generalized) (M62.81);Pain Pain - part of body: (back)    Time: 3220-2542 PT Time Calculation (min) (ACUTE ONLY): 12 min   Charges:   PT Evaluation $PT Eval Moderate Complexity: 1 Mod     PT G Codes:        Amedeo Detweiler,PT Acute Rehabilitation [phone] (pager)   Denice Paradise 12/23/2017, 12:49 PM

## 2017-12-23 NOTE — Consult Note (Signed)
   [facility] CM Inpatient Consult   12/23/2017  Scott Rollins [birth year] [id number]   Patient is currently active with [facility] for chronic disease management services.  Patient has been engaged by a [facility] for his Symbicort,  [name], Eliquis has been approved according to documentation for assistance. Our community based plan of care has focused on disease management and community resource support.  Patient will receive a post discharge transition of care call and will be evaluated for monthly home visits for assessments and disease process education.  Made Inpatient Case Manager aware that [facility] following.  Met with the patient at the bedside.  Patient expresses ongoing [facility] [facility] to follow.  Patient's primary care provider Shawnie Dapper with [facility] provides the transition of care calls and follow up.  Of note, [facility] Care Management services does not replace or interfere with any services that are needed or arranged by inpatient case management or social work.  For additional questions or referrals please contact:  Natividad Brood, RN BSN [facility] Liaison  [phone] business mobile phone Toll free office [phone]

## 2017-12-23 NOTE — Telephone Encounter (Signed)
Copied from [place] [id number]. Topic: Referral - Request >> Dec 23, 2017  8:13 AM Scherrie Gerlach wrote: Reason for CRM: Jarrett Soho w/ [facility] states they cannot accept pt without a PFT done   But if Dr Anitra Lauth wants to change the order/referral to hypertension, they will accept him.  Jarrett Soho states not sure how soon pt can get the PFT. Pt is currently admitted to the hospital.

## 2017-12-23 NOTE — Telephone Encounter (Signed)
OK, referral reordered with dx of HTN.

## 2017-12-24 ENCOUNTER — Telehealth: Payer: Self-pay | Admitting: *Deleted

## 2017-12-24 ENCOUNTER — Inpatient Hospital Stay: Payer: Medicare Other | Admitting: Oncology

## 2017-12-24 ENCOUNTER — Other Ambulatory Visit: Payer: Self-pay | Admitting: *Deleted

## 2017-12-24 DIAGNOSIS — J441 Chronic obstructive pulmonary disease with (acute) exacerbation: Principal | ICD-10-CM

## 2017-12-24 DIAGNOSIS — J9602 Acute respiratory failure with hypercapnia: Secondary | ICD-10-CM

## 2017-12-24 DIAGNOSIS — I10 Essential (primary) hypertension: Secondary | ICD-10-CM

## 2017-12-24 LAB — MAGNESIUM: Magnesium: 1.8 mg/dL (ref 1.7–2.4)

## 2017-12-24 LAB — BASIC METABOLIC PANEL
BUN: 27 mg/dL — ABNORMAL HIGH (ref 6–20)
CALCIUM: 8.6 mg/dL — AB (ref 8.9–10.3)
CHLORIDE: 82 mmol/L — AB (ref 101–111)
Creatinine, Ser: 0.93 mg/dL (ref 0.61–1.24)
GFR calc Af Amer: >60 mL/min (ref >60)
GFR calc non Af Amer: >60 mL/min (ref >60)
GLUCOSE: 105 mg/dL — AB (ref 65–99)
POTASSIUM: 2.9 mmol/L — AB (ref 3.5–5.1)
Sodium: 142 mmol/L (ref 135–145)

## 2017-12-24 LAB — CBC
HEMATOCRIT: 42.5 % (ref 39.0–52.0)
HEMOGLOBIN: 12.9 g/dL — AB (ref 13.0–17.0)
MCH: 27.4 pg (ref 26.0–34.0)
MCHC: 30.4 g/dL (ref 30.0–36.0)
MCV: 90.4 fL (ref 78.0–100.0)
Platelets: 297 10*3/uL (ref 150–400)
RBC: 4.7 MIL/uL (ref 4.22–5.81)
RDW: 14.1 % (ref 11.5–15.5)
WBC: 12.6 10*3/uL — ABNORMAL HIGH (ref 4.0–10.5)

## 2017-12-24 LAB — GLUCOSE, CAPILLARY: Glucose-Capillary: 146 mg/dL — ABNORMAL HIGH (ref 65–99)

## 2017-12-24 MED ORDER — POTASSIUM CHLORIDE 20 MEQ/15ML (10%) PO SOLN
40.0000 meq | Freq: Once | ORAL | Status: AC
  Administered 2017-12-24: 40 meq via ORAL
  Filled 2017-12-24: qty 30

## 2017-12-24 MED ORDER — MAGNESIUM SULFATE 2 GM/50ML IV SOLN
2.0000 g | Freq: Once | INTRAVENOUS | Status: AC
  Administered 2017-12-24: 2 g via INTRAVENOUS
  Filled 2017-12-24: qty 50

## 2017-12-24 MED ORDER — POTASSIUM CHLORIDE 10 MEQ/100ML IV SOLN
10.0000 meq | INTRAVENOUS | Status: DC
  Administered 2017-12-24: 10 meq via INTRAVENOUS
  Filled 2017-12-24 (×2): qty 100

## 2017-12-24 MED ORDER — PREDNISONE 20 MG PO TABS
20.0000 mg | ORAL_TABLET | Freq: Every day | ORAL | Status: DC
  Administered 2017-12-25 – 2017-12-26 (×2): 20 mg via ORAL
  Filled 2017-12-24 (×2): qty 1

## 2017-12-24 NOTE — Progress Notes (Signed)
Responded to [facility] to assist with AD.  Patient already has form but not ready to complete as yet.  Chaplain will follow as needed.  Jaclynn Major, [place], [facility], Pager [phone]

## 2017-12-24 NOTE — Telephone Encounter (Signed)
Message left for collaborative in reference to spouse calling yesterday reporting patient "hospitalized at [facility].  Because of his heart and COPD.  I'd like CT scan results called to me if report is good and need tomorrow's appointment cancelled."

## 2017-12-24 NOTE — Progress Notes (Signed)
RT placed patient on BIPAP for the night. Patient is resting comfortably at this time. RT will monitor as needed.

## 2017-12-24 NOTE — Progress Notes (Signed)
PROGRESS NOTE    Scott Rollins  WCH:[id number] DOB: 04-05-1951 DOA: 12/21/2017 PCP: [name], MD   Brief Narrative:  67 y.o.WM PMHx  COPD with ongoing tobacco use on home O2 (3L all times); seizure d/o; metastatic prostate CA; pre-DM; afib; OSA on CPAP; morbid obesity; CAD; HTN; HLD; and CAD   Presenting with respiratory distress and confusion.        Subjective: 6/13/O x4, negative CP, positive chronic S OB now back on 3 L O2 via [place] (home dose).  Feels significantly improved.  Has not tried to ambulate beyond the door of his room.  Has agreed to be discharged to SNF and has been provided some literature.    Assessment & Plan:   Principal Problem:   Acute on chronic respiratory failure with hypoxia and hypercapnia (HCC) Active Problems:   Essential hypertension   TOBACCO ABUSE   Goals of care, counseling/discussion   Chronic pain syndrome   OSA (obstructive sleep apnea)   Atrial fibrillation (HCC)   Prostate cancer metastatic to bone (HCC)   COPD exacerbation (HCC)   Acute exacerbation of CHF (congestive heart failure) ([place])   Palliative care by specialist   Acute pulmonary edema (HCC)  Acute on chronic respiratory failure with hypoxia/hypercapnia/COPD exacerbation - Titrate O2 to maintain SPO2 89 to 93%  - CPAP at night.  BiPAP if required.   -Mucinex DM  - Complete 7-day course of antibiotics  - DuoNeb 3 times daily -6/13 decrease prednisone 20 mg daily.  Would discharge on steroids and taper slowly to prevent rebound. -6/14 obtain ambulatory SPO2 record findings in appropriate epic note.  OSA/obesity hypoventilation syndrome -See respiratory failure -Counseled patient that most likely will not be able to obtain replacement CPAP machine without updated sleep study however I spoken to [facility] Kristi who will inquire, and to weigh around department for new sleep study given patient's terminal cancer.   Tobacco abuse - Patient stated discontinued 2 weeks  ago? - Nicotine patch -Counseled patient on absolute need to refrain from future smoking.  Chronic diastolic CHF (dry weight 353 kg) - Strict in and out since admission -9.3 L -Daily weight Filed Weights   12/21/17 0529 12/23/17 0600 12/24/17 0453  Weight: 269 lb (122 kg) 267 lb 4.8 oz (121.2 kg) 263 lb 4.8 oz (119.4 kg)  -Currently at goal dry weight.  Continue to diurese until patient's Cr increases. - Transfuse for hemoglobin<8  - 81 mg daily - Cardizem 60 mg BID -Hydralazine PRN  Essential HTN -See CHF  Chronic atrial fibrillation - Rate controlled - Eliquis 5 mg BID  Chronic pain syndrome - Secondary to metastatic prostate cancer to bone -According to the [place] Controlled Substances Reporting System he is receiving medications from only one provider and appears to be taking them as prescribed    Metastatic prostate cancer (to bones) - Diagnosed 2012 metastatic prostate CA -receiving palliative therapy by Dr. Alen Blew.  Oncology -Appears very realistic about his prognosis understanding that he is in his terminal phase.  To remain full code during this admission   Hypokalemia -Potassium goal> 4 - Potassium IV 60 mEq  Hypomagnesia -Magnesium goal> 2 -Magnesium IV 2 g      Goals of care - 6/12 PALLIATIVE CARE:COPD with ongoing tobacco use on home O2 (3L all times); seizure d/o; metastatic prostate CA; pre-DM; afib; OSA on CPAP Evaluate for change of CODE STATUS to DNR, short-term vs long-term goals of care.   DVT prophylaxis: Eliquis Code Status: Full  Family Communication: None Disposition Plan: SNF   Consultants:  None     Procedures/Significant Events:  None   I have personally reviewed and interpreted all radiology studies and my findings are as above.  VENTILATOR SETTINGS: BiPAP   Cultures   Antimicrobials: Anti-infectives (From admission, onward)   Start     Stop   12/23/17 1000  doxycycline (VIBRA-TABS) tablet 100 mg         12/21/17 1500   doxycycline (VIBRAMYCIN) 100 mg in sodium chloride 0.9 % 250 mL IVPB     12/22/17 1833         Devices    LINES / TUBES:        Continuous Infusions:   Objective: Vitals:   12/24/17 0047 12/24/17 0453 12/24/17 0832 12/24/17 0932  BP:  (!) 147/76 (!) 174/77   Pulse:  (!) 59    Resp:  20    Temp:  (!) 97.5 F (36.4 C)    TempSrc:  Oral    SpO2: 96% 97%  90%  Weight:  263 lb 4.8 oz (119.4 kg)    Height:        Intake/Output Summary (Last 24 hours) at 12/24/2017 0954 Last data filed at 12/24/2017 0836 Gross per 24 hour  Intake 723 ml  Output 2225 ml  Net -1502 ml   Filed Weights   12/21/17 0529 12/23/17 0600 12/24/17 0453  Weight: 269 lb (122 kg) 267 lb 4.8 oz (121.2 kg) 263 lb 4.8 oz (119.4 kg)    Physical Exam:  General: A/O x4, positive acute on chronic respiratory distress Neck:  Negative scars, masses, torticollis, lymphadenopathy, JVD Lungs: Clear to auscultation bilaterally without wheezes or crackles Cardiovascular: Regular rate and rhythm without murmur gallop or rub normal S1 and S2 Abdomen: Morbidly obese, negative abdominal pain, nondistended, positive soft, bowel sounds, no rebound, no ascites, no appreciable mass Extremities: No significant cyanosis, clubbing, or edema bilateral lower extremities Skin: Negative rashes, lesions, ulcers Psychiatric:  Negative depression, negative anxiety, negative fatigue, negative mania  Central nervous system:  Cranial nerves II through XII intact, tongue/uvula midline, all extremities muscle strength 5/5, sensation intact throughout, negative dysarthria, negative expressive aphasia, negative receptive aphasia.  .     Data Reviewed: Care during the described time interval was provided by me .  I have reviewed this patient's available data, including medical history, events of note, physical examination, and all test results as part of my evaluation.   CBC: Recent Labs  Lab 12/21/17 0531 12/22/17 0535  12/23/17 0410  WBC 13.0* 7.4 10.2  NEUTROABS 11.2*  --   --   HGB 13.5 12.0* 12.3*  HCT 46.7 39.1 39.0  MCV 96.1 90.7 87.8  PLT 267 223 144   Basic Metabolic Panel: Recent Labs  Lab 12/21/17 0531 12/22/17 0535 12/23/17 0410  NA 143 141 140  K 4.7 3.7 4.4  CL 94* 88* 85*  CO2 41* 46* 48*  GLUCOSE 132* 118* 115*  BUN 14 18 33*  CREATININE 0.76 0.88 0.92  CALCIUM 8.6* 8.7* 8.5*   GFR: Estimated Creatinine Clearance: 99.6 mL/min (by C-G formula based on SCr of 0.92 mg/dL). Liver Function Tests: No results for input(s): AST, ALT, ALKPHOS, BILITOT, PROT, ALBUMIN in the last 168 hours. No results for input(s): LIPASE, AMYLASE in the last 168 hours. No results for input(s): AMMONIA in the last 168 hours. Coagulation Profile: No results for input(s): INR, PROTIME in the last 168 hours. Cardiac Enzymes: Recent Labs  Lab 12/21/17  [place] <0.03   BNP (last 3 results) No results for input(s): PROBNP in the last 8760 hours. HbA1C: No results for input(s): HGBA1C in the last 72 hours. CBG: No results for input(s): GLUCAP in the last 168 hours. Lipid Profile: No results for input(s): CHOL, HDL, LDLCALC, TRIG, CHOLHDL, LDLDIRECT in the last 72 hours. Thyroid Function Tests: No results for input(s): TSH, T4TOTAL, FREET4, T3FREE, THYROIDAB in the last 72 hours. Anemia Panel: No results for input(s): VITAMINB12, FOLATE, FERRITIN, TIBC, IRON, RETICCTPCT in the last 72 hours. Urine analysis:    Component Value Date/Time   COLORURINE YELLOW 01/21/2017 1401   APPEARANCEUR HAZY (A) 01/21/2017 1401   LABSPEC 1.028 01/21/2017 1401   PHURINE 5.0 01/21/2017 1401   GLUCOSEU NEGATIVE 01/21/2017 1401   HGBUR SMALL (A) 01/21/2017 1401   HGBUR negative 08/21/2010 0831   BILIRUBINUR NEGATIVE 01/21/2017 1401   KETONESUR NEGATIVE 01/21/2017 1401   PROTEINUR NEGATIVE 01/21/2017 1401   UROBILINOGEN 0.2 08/21/2010 0831   NITRITE NEGATIVE 01/21/2017 1401   LEUKOCYTESUR NEGATIVE  01/21/2017 1401   Sepsis Labs: @LABRCNTIP (procalcitonin:4,lacticidven:4)  ) Recent Results (from the past 240 hour(s))  MRSA PCR Screening     Status: None   Collection Time: 12/22/17  4:20 PM  Result Value Ref Range Status   MRSA by PCR NEGATIVE NEGATIVE Final    Comment:        The GeneXpert MRSA Assay (FDA approved for NASAL specimens only), is one component of a comprehensive MRSA colonization surveillance program. It is not intended to diagnose MRSA infection nor to guide or monitor treatment for MRSA infections. Performed at [facility], [place] [address] [place], [place] [address]          Radiology Studies: No results found.      Scheduled Meds: . abiraterone acetate  1,000 mg Oral Daily  . apixaban  5 mg Oral BID  . aspirin EC  81 mg Oral Daily  . bisoprolol  10 mg Oral Daily  . citalopram  40 mg Oral QHS  . diltiazem  60 mg Oral Q12H  . docusate sodium  100 mg Oral BID  . doxycycline  100 mg Oral Q12H  . feeding supplement (ENSURE ENLIVE)  237 mL Oral TID BM  . furosemide  80 mg Intravenous Q12H  . gabapentin  300 mg Oral QHS  . ipratropium-albuterol  3 mL Nebulization TID  . lisinopril  10 mg Oral Daily  . nicotine  14 mg Transdermal Daily  . pantoprazole  40 mg Oral Daily  . predniSONE  40 mg Oral Q supper  . sodium chloride flush  3 mL Intravenous Q12H  . tamsulosin  0.4 mg Oral QPC supper   Continuous Infusions:   LOS: 3 days    Time spent: 40 minutes    Tiarra Anastacio, Geraldo Docker, MD [facility] Pager [phone]   If 7PM-7AM, please contact night-coverage [email or web address] Password [facility] 12/24/2017, 9:54 AM

## 2017-12-24 NOTE — NC FL2 (Signed)
[place] LEVEL OF CARE SCREENING TOOL     IDENTIFICATION  Patient Name: Scott Rollins Birthdate: June 21, 1951 Sex: male Admission Date (Current Location): 12/21/2017  [facility] [place] and [place] Number:  Herbalist and Address:  [facility] [place] [address] [place], [place] [address]      Provider Number: [id number]  Attending Physician Name and Address:  Allie Bossier, MD  Relative Name and Phone Number:  Treshaun Carrico, spouse, [phone]    Current Level of Care: Hospital Recommended Level of Care: [place] Prior Approval Number:    Date Approved/Denied:   PASRR Number: [id number]  Discharge Plan: Home    Current Diagnoses: Patient Active Problem List   Diagnosis Date Noted  . Palliative care by specialist   . Acute pulmonary edema ([place])   . Acute on chronic respiratory failure with hypoxia and hypercapnia ([place]) 12/21/2017  . Acute exacerbation of CHF (congestive heart failure) ([place]) 12/21/2017  . Port-A-Cath in place 12/16/2017  . COPD exacerbation ([place]) 11/16/2017  . Acute hypercapnic respiratory failure ([place]) 11/16/2017  . Prostate cancer metastatic to bone ([place])   . Abnormal CT of the chest 01/21/2017  . Obesity hypoventilation syndrome ([place])   . Atrial fibrillation ([place]) 01/15/2017  . Community acquired pneumonia of right lower lobe of lung ([place]) 11/05/2016  . Hyponatremia 11/05/2016  . OSA (obstructive sleep apnea) 11/05/2016  . 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 COPD (chronic obstructive pulmonary disease) ([place]) 04/19/2014  . Chronic pain syndrome 01/11/2014  . Chronic gout 04/22/2013  . Goals of care, counseling/discussion 04/29/2012  . Mixed hyperlipidemia 08/27/2010  . ELEVATED PROSTATE SPECIFIC ANTIGEN 08/26/2010  . 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 HEMATURIA, HX OF 08/21/2010  . TOBACCO ABUSE 07/11/2010  . NUMMULAR ECZEMA 06/24/2010  . DYSPNEA 04/10/2009  . OBESITY  04/09/2009  . Essential hypertension 04/09/2009  . Coronary artery disease due to lipid rich plaque 04/09/2009  . DEGENERATIVE JOINT DISEASE 04/09/2009    Orientation RESPIRATION BLADDER Height & Weight     Self, Time, Situation, Place  (nasal cannula 3L, bipap/cpap) Incontinent, External catheter Weight: 263 lb 4.8 oz (119.4 kg) Height:  5\' 10"  (177.8 cm)  BEHAVIORAL SYMPTOMS/MOOD NEUROLOGICAL BOWEL NUTRITION STATUS      Continent Diet(please see DC summary)  AMBULATORY STATUS COMMUNICATION OF NEEDS Skin   Limited Assist Verbally Normal                       Personal Care Assistance Level of Assistance  Bathing, Feeding, Dressing Bathing Assistance: Limited assistance Feeding assistance: Independent Dressing Assistance: Limited assistance     Functional Limitations Info  Sight, Hearing, Speech Sight Info: Adequate Hearing Info: Adequate Speech Info: Adequate    SPECIAL CARE FACTORS FREQUENCY  PT (By licensed PT)     PT Frequency: 5x/week              Contractures Contractures Info: Not present    Additional Factors Info  Code Status, Allergies, Psychotropic Code Status Info: Full Allergies Info: Amitriptyline Hcl, Ezetimibe Psychotropic Info: celexa         Current Medications (12/24/2017):  This is the current hospital active medication list Current Facility-Administered Medications  Medication Dose Route Frequency Provider Last Rate Last Dose  . abiraterone acetate (ZYTIGA) tablet 1,000 mg  1,000 mg Oral Daily Karmen Bongo, MD      . acetaminophen (TYLENOL) tablet 650 mg  650 mg Oral Q6H PRN Karmen Bongo, MD      .  albuterol (PROVENTIL) (2.5 MG/3ML) 0.083% nebulizer solution 2.5 mg  2.5 mg Nebulization Q2H PRN Karmen Bongo, MD      . apixaban Arne Cleveland) tablet 5 mg  5 mg Oral BID Karmen Bongo, MD   5 mg at 12/24/17 3557  . aspirin EC tablet 81 mg  81 mg Oral Daily Cherene Altes, MD   81 mg at 12/24/17 3220  . bisoprolol (ZEBETA)  tablet 10 mg  10 mg Oral Daily Cherene Altes, MD   10 mg at 12/24/17 0827  . citalopram (CELEXA) tablet 40 mg  40 mg Oral Ivery Quale, MD   40 mg at 12/23/17 2100  . dextromethorphan-guaiFENesin (MUCINEX DM) 30-600 MG per 12 hr tablet 1 tablet  1 tablet Oral BID PRN Karmen Bongo, MD      . diltiazem (CARDIZEM) tablet 60 mg  60 mg Oral Q12H Cherene Altes, MD   60 mg at 12/24/17 2542  . docusate sodium (COLACE) capsule 100 mg  100 mg Oral BID Karmen Bongo, MD   100 mg at 12/24/17 7062  . doxycycline (VIBRA-TABS) tablet 100 mg  100 mg Oral Q12H Cherene Altes, MD   100 mg at 12/24/17 3762  . feeding supplement (ENSURE ENLIVE) (ENSURE ENLIVE) liquid 237 mL  237 mL Oral TID BM Karmen Bongo, MD   237 mL at 12/23/17 2038  . furosemide (LASIX) injection 80 mg  80 mg Intravenous Lillia [place], MD   80 mg at 12/24/17 0600  . gabapentin (NEURONTIN) capsule 300 mg  300 mg Oral QHS Karmen Bongo, MD   300 mg at 12/23/17 2100  . hydrALAZINE (APRESOLINE) injection 5 mg  5 mg Intravenous Q4H PRN Karmen Bongo, MD   5 mg at 12/22/17 1350  . ipratropium-albuterol (DUONEB) 0.5-2.5 (3) MG/3ML nebulizer solution 3 mL  3 mL Nebulization TID Karmen Bongo, MD   3 mL at 12/24/17 0931  . lisinopril (PRINIVIL,ZESTRIL) tablet 10 mg  10 mg Oral Daily Karmen Bongo, MD   10 mg at 12/24/17 8315  . nicotine (NICODERM CQ - dosed in mg/24 hours) patch 14 mg  14 mg Transdermal Daily Karmen Bongo, MD      . ondansetron [facility]) tablet 4 mg  4 mg Oral Q6H PRN Karmen Bongo, MD       Or  . ondansetron [facility]) injection 4 mg  4 mg Intravenous Q6H PRN Karmen Bongo, MD      . oxyCODONE (Oxy IR/ROXICODONE) immediate release tablet 10 mg  10 mg Oral Q8H PRN Cherene Altes, MD   10 mg at 12/24/17 0507  . pantoprazole (PROTONIX) EC tablet 40 mg  40 mg Oral Daily Karmen Bongo, MD   40 mg at 12/24/17 0831  . predniSONE (DELTASONE) tablet 40 mg  40 mg Oral Q supper Karmen Bongo,  MD   40 mg at 12/23/17 1724  . prochlorperazine (COMPAZINE) tablet 10 mg  10 mg Oral Q6H PRN Karmen Bongo, MD      . sodium chloride flush (NS) 0.9 % injection 3 mL  3 mL Intravenous Q12H Karmen Bongo, MD   3 mL at 12/24/17 0836  . tamsulosin (FLOMAX) capsule 0.4 mg  0.4 mg Oral QPC supper Karmen Bongo, MD   0.4 mg at 12/23/17 1724  . traMADol (ULTRAM) tablet 50 mg  50 mg Oral Q6H PRN Cherene Altes, MD   50 mg at 12/24/17 1004     Discharge Medications: Please see discharge summary for a list of discharge  medications.  Relevant Imaging Results:  Relevant Lab Results:   Additional Information SSN: [id number]  Estanislado Emms, LCSW

## 2017-12-24 NOTE — Progress Notes (Signed)
Provided bed offers to patient. Will follow up for choice and support with discharge planning.  Scott Rollins, [place]

## 2017-12-24 NOTE — Plan of Care (Signed)
  Problem: Activity: Goal: Risk for activity intolerance will decrease Outcome: Progressing   Problem: Nutrition: Goal: Adequate nutrition will be maintained Outcome: Progressing   Problem: Coping: Goal: Level of anxiety will decrease Outcome: Progressing   Problem: Education: Goal: Knowledge of General Education information will improve Outcome: Completed/Met
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
## 2017-12-24 NOTE — Progress Notes (Signed)
Pt placed on Bipap for night time rest tolerating well.

## 2017-12-25 LAB — BASIC METABOLIC PANEL
Anion gap: 10 (ref 5–15)
BUN: 27 mg/dL — AB (ref 6–20)
CHLORIDE: 83 mmol/L — AB (ref 101–111)
CO2: 44 mmol/L — AB (ref 22–32)
Calcium: 8.7 mg/dL — ABNORMAL LOW (ref 8.9–10.3)
Creatinine, Ser: 0.97 mg/dL (ref 0.61–1.24)
GFR calc non Af Amer: >60 mL/min (ref >60)
Glucose, Bld: 159 mg/dL — ABNORMAL HIGH (ref 65–99)
POTASSIUM: 3.3 mmol/L — AB (ref 3.5–5.1)
SODIUM: 137 mmol/L (ref 135–145)

## 2017-12-25 LAB — CBC WITH DIFFERENTIAL/PLATELET
ABS IMMATURE GRANULOCYTES: 0 10*3/uL (ref 0.0–0.1)
BASOS ABS: 0 10*3/uL (ref 0.0–0.1)
Basophils Relative: 0 %
Eosinophils Absolute: 0 10*3/uL (ref 0.0–0.7)
Eosinophils Relative: 0 %
HEMATOCRIT: 44.3 % (ref 39.0–52.0)
HEMOGLOBIN: 13.4 g/dL (ref 13.0–17.0)
Immature Granulocytes: 0 %
LYMPHS ABS: 1.2 10*3/uL (ref 0.7–4.0)
LYMPHS PCT: 11 %
MCH: 27.2 pg (ref 26.0–34.0)
MCHC: 30.2 g/dL (ref 30.0–36.0)
MCV: 89.9 fL (ref 78.0–100.0)
Monocytes Absolute: 0.4 10*3/uL (ref 0.1–1.0)
Monocytes Relative: 4 %
NEUTROS ABS: 9.5 10*3/uL — AB (ref 1.7–7.7)
Neutrophils Relative %: 85 %
Platelets: 293 10*3/uL (ref 150–400)
RBC: 4.93 MIL/uL (ref 4.22–5.81)
RDW: 14.1 % (ref 11.5–15.5)
WBC: 11.2 10*3/uL — AB (ref 4.0–10.5)

## 2017-12-25 LAB — GLUCOSE, CAPILLARY: Glucose-Capillary: 153 mg/dL — ABNORMAL HIGH (ref 65–99)

## 2017-12-25 LAB — MAGNESIUM: MAGNESIUM: 2.1 mg/dL (ref 1.7–2.4)

## 2017-12-25 NOTE — Progress Notes (Signed)
Daily Progress Note   Patient Name: Scott Rollins       Date: 12/25/2017 DOB: [birth year]  Age: 67 y.o. MRN#: [id number] Attending Physician: Allie Bossier, MD Primary Care Physician: [name], MD Admit Date: 12/21/2017  Reason for Consultation/Follow-up: Establishing goals of care  Subjective/GOC: Patient awake, alert, oriented. Sitting up in bed eating lunch. Denies pain or discomfort. Tells me he is feeling much better today and was able to walk in the halls with physical therapy.  Discussed plan for discharge to SNF for rehab. Patient is eager to work on his strength with hopes of returning home and going to the beach with family.   He asks about CPAP and understands he may have to repeat sleep study outpatient.  I inquired if he had questions or concerns regarding advanced directives or palliative/hospice approach. Patient does not have questions. He plans to give all information I have provided to his wife, Scott Rollins. He speaks again of being accepting of long-term poor prognosis but is most worried about how his family will cope when he is gone.   Emotional support provided. [name] has PMT contact information.   Length of Stay: 4  Current Medications: Scheduled Meds:  . apixaban  5 mg Oral BID  . aspirin EC  81 mg Oral Daily  . bisoprolol  10 mg Oral Daily  . citalopram  40 mg Oral QHS  . diltiazem  60 mg Oral Q12H  . docusate sodium  100 mg Oral BID  . doxycycline  100 mg Oral Q12H  . feeding supplement (ENSURE ENLIVE)  237 mL Oral TID BM  . furosemide  80 mg Intravenous Q12H  . gabapentin  300 mg Oral QHS  . ipratropium-albuterol  3 mL Nebulization TID  . lisinopril  10 mg Oral Daily  . nicotine  14 mg Transdermal Daily  . pantoprazole  40 mg Oral Daily    . predniSONE  20 mg Oral Q supper  . sodium chloride flush  3 mL Intravenous Q12H  . tamsulosin  0.4 mg Oral QPC supper    Continuous Infusions:   PRN Meds: acetaminophen **OR** [DISCONTINUED] acetaminophen, albuterol, dextromethorphan-guaiFENesin, hydrALAZINE, ondansetron **OR** ondansetron (ZOFRAN) IV, oxyCODONE, prochlorperazine, traMADol  Physical Exam  Constitutional: He is oriented to person, place, and time. He is cooperative.  HENT:  Head: Normocephalic and atraumatic.  Pulmonary/Chest: No accessory muscle usage. No tachypnea. No respiratory distress.  Comfortable off BiPAP on [place]  Neurological: He is alert and oriented to person, place, and time.  Skin: Skin is warm and dry.  Psychiatric: He has a normal mood and affect. His speech is normal and behavior is normal. Cognition and memory are normal.  Nursing note and vitals reviewed.          Vital Signs: BP 131/70 (BP Location: Right Arm)   Pulse (!) 58   Temp 97.6 F (36.4 C) (Oral)   Resp 20   Ht 5\' 10"  (1.778 m)   Wt 118.1 kg (260 lb 6.4 oz)   SpO2 95%   BMI 37.36 kg/m  SpO2: SpO2: 95 % O2 Device: O2 Device: Nasal Cannula O2 Flow Rate: O2 Flow Rate (L/min): 3 L/min  Intake/output summary:   Intake/Output Summary (Last 24 hours) at 12/25/2017 1215 Last data filed at 12/25/2017 1100 Gross per 24 hour  Intake 1080 ml  Output 1350 ml  Net -270 ml   LBM: Last BM Date: 12/23/17 Baseline Weight: Weight: 122 kg (269 lb) Most recent weight: Weight: 118.1 kg (260 lb 6.4 oz)       Palliative Assessment/Data: PPS 50%   Flowsheet Rows     Most Recent Value  Intake Tab  Referral Department  Hospitalist  Unit at Time of Referral  ER  Palliative Care Primary Diagnosis  -- [prostate cancer, COPD, CHF]  Date Notified  12/21/17  Palliative Care Type  New Palliative care  Reason for referral  Clarify Goals of Care  Date of Admission  12/21/17  Date first seen by Palliative Care  12/22/17  # of days Palliative  referral response time  1 Day(s)  # of days IP prior to Palliative referral  0  Clinical Assessment  Palliative Performance Scale Score  50%  Psychosocial & Spiritual Assessment  Palliative Care Outcomes  Patient/Family meeting held?  Yes  Who was at the meeting?  patient  Palliative Care Outcomes  Clarified goals of care, Counseled regarding hospice, ACP counseling assistance, Provided psychosocial or spiritual support, Provided end of life care assistance      Patient Active Problem List   Diagnosis Date Noted  . Palliative care by specialist   . Acute pulmonary edema ([place])   . Acute on chronic respiratory failure with hypoxia and hypercapnia ([place]) 12/21/2017  . Acute exacerbation of CHF (congestive heart failure) ([place]) 12/21/2017  . Port-A-Cath in place 12/16/2017  . COPD exacerbation ([place]) 11/16/2017  . Acute hypercapnic respiratory failure ([place]) 11/16/2017  . Prostate cancer metastatic to bone ([place])   . Abnormal CT of the chest 01/21/2017  . Obesity hypoventilation syndrome ([place])   . Atrial fibrillation ([place]) 01/15/2017  . Community acquired pneumonia of right lower lobe of lung ([place]) 11/05/2016  . Hyponatremia 11/05/2016  . OSA (obstructive sleep apnea) 11/05/2016  . 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 COPD (chronic obstructive pulmonary disease) ([place]) 04/19/2014  . Chronic pain syndrome 01/11/2014  . Chronic gout 04/22/2013  . Goals of care, counseling/discussion 04/29/2012  . Mixed hyperlipidemia 08/27/2010  . ELEVATED PROSTATE SPECIFIC ANTIGEN 08/26/2010  . 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 HEMATURIA, HX OF 08/21/2010  . TOBACCO ABUSE 07/11/2010  . NUMMULAR ECZEMA 06/24/2010  . DYSPNEA 04/10/2009  . OBESITY 04/09/2009  . Essential hypertension 04/09/2009  . Coronary artery disease due to lipid rich plaque 04/09/2009  . DEGENERATIVE JOINT DISEASE 04/09/2009    Palliative Care  Assessment & Plan   Patient Profile: 67 y.o. male  with past medical history of  COPD on home oxygen 3L, smoker, CHF, metastatic prostate cancer, afib, DM, seizures, OSA, CAD, HTN, HLD, and obesity admitted on 12/21/2017 with respiratory distress. Recent admission for COPD/CHF. ECHO revealed preserved ejection fraction and grade 1 diastolic dysfunction. In ED, patient placed on BiPAP with improvement in respiratory status. Chest xray negative for pneumonia, worsening heart failure. CO2 114. Lasix 80mg  given. Palliative medicine consultation for goals of care.   Assessment: Acute on chronic respiratory failure COPD exacerbation Diastolic CHF Metastatic prostate cancer Chronic cancer related pain Tobacco dependence OSA Chronic afib  Recommendations/Plan:  FULL code/FULL scope. Educational information has been given to patient. Encouraged patient to discuss advanced directives/EOL wishes with his wife.   Patient/family plan to continue f/u with outpatient oncology.   Continue home pain regimen. Patient may benefit from low-dose OxyContin for chronic bone pain. Per patient, Dr. Alen Blew manages opioids outpatient.   Likely SNF for rehab when stable for discharge. May benefit from outpatient palliative follow-up.   Goals of Care and Additional Recommendations:  Limitations on Scope of Treatment: Full Scope Treatment  Code Status: FULL   Code Status Orders  (From admission, onward)        Start     Ordered   12/22/17 1655  Full code  Continuous     12/22/17 1654    Code Status History    Date Active Date Inactive Code Status Order ID Comments User Context   12/21/2017 1422 12/22/2017 1654 DNR [id number]  Karmen Bongo, MD ED   11/16/2017 1914 11/19/2017 1951 Full Code [id number]  Reyne Dumas, MD ED   01/21/2017 1639 01/25/2017 1501 Full Code [id number]  Radene Gunning, NP ED   01/15/2017 0823 01/17/2017 1935 Full Code [id number]  Rondel Jumbo, PA-C ED   11/05/2016 0146 11/07/2016 1800 Full Code [id number]  Danford, Suann Larry, MD Inpatient       Prognosis:    Unable to determine:guarded with acute on chronic respiratory failure secondary to COPD and CHF. Also with progressive metastatic prostate cancer.   Discharge Planning:  To Be Determined  Care plan was discussed with patient  Thank you for allowing the Palliative Medicine Team to assist in the care of this patient.   Time In: 1155 Time Out: 1210 Total Time 2min Prolonged Time Billed  no      Greater than 50%  of this time was spent counseling and coordinating care related to the above assessment and plan.  Ihor Dow, FNP-C Palliative Medicine Team  Phone: [phone] Fax: [phone]  Please contact Palliative Medicine Team phone at [phone] for questions and concerns.

## 2017-12-25 NOTE — Care Management Important Message (Signed)
Important Message  Patient Details  Name: Scott Rollins MRN: [id number] Date of Birth: [birth year]   Medicare Important Message Given:  Yes    Barb Merino Pajonal 12/25/2017, 3:45 PM

## 2017-12-25 NOTE — Progress Notes (Signed)
Physical Therapy Treatment Patient Details Name: Scott Rollins MRN: [id number] DOB: June 22, 1951 Today's Date: 12/25/2017    History of Present Illness Pt is a 67 year old man with PMH of prostate cancer with bone mets, COPD on 3L at home, seizure d/o, pre-DM, afib, OSA, morbid obesity, CAD, HTN admitted with acute on chronic respiratory failure requiring bipap in the ED.     PT Comments    Pt tolerated increased ambulation distance today with 1 seated rest break. SpO2 decreased to 84% during gait, requiring O2 be increased to 4L for remainder of PT session. Provided pt with ther ex for quad strengthening as pt reports knees buckle abruptly. Will continue to follow acutely to maximize functional independence and activity tolerance.     Follow Up Recommendations  SNF;Supervision/Assistance - 24 hour     Equipment Recommendations  None recommended by PT    Recommendations for Other Services       Precautions / Restrictions Precautions Precautions: Fall Precaution Comments: fell 3 times in the last 2 months, reports knees buckle without warning. Restrictions Weight Bearing Restrictions: No    Mobility  Bed Mobility Overal bed mobility: Needs Assistance Bed Mobility: Rolling;Sidelying to Sit;Sit to Supine Rolling: Min assist Sidelying to sit: Min assist   Sit to supine: Min assist   General bed mobility comments: Min a and cues for technique to perform log roll and power up into seated position. Pt required min A for LE management to return to supine in bed.  Transfers Overall transfer level: Needs assistance Equipment used: Rolling walker (2 wheeled) Transfers: Sit to/from Stand Sit to Stand: Min guard         General transfer comment: Cues for hand placement and increased time required  Ambulation/Gait Ambulation/Gait assistance: Min assist;+2 safety/equipment Gait Distance (Feet): 50 Feet Assistive device: Rolling walker (2 wheeled) Gait Pattern/deviations:  Step-through pattern;Decreased stride length;Antalgic;Trunk flexed;Wide base of support;Shuffle     General Gait Details: Chair close behind as pt reports knees buckle abruptly. One seated rest break secondary to dizziness and O2 desat. SpO2 dropped to 84% during ambulation. Pt began pursed lipped breathing w/o cues, however SpO2 did not recover until O2 was increased to 4L. Ambulation continued on 4L. No additional desat observed.    Stairs             Wheelchair Mobility    Modified Rankin (Stroke Patients Only)       Balance Overall balance assessment: Needs assistance Sitting-balance support: No upper extremity supported;Feet supported Sitting balance-Leahy Scale: Fair     Standing balance support: Bilateral upper extremity supported;During functional activity Standing balance-Leahy Scale: Poor Standing balance comment: relies on UEs for support.                             Cognition Arousal/Alertness: Awake/alert Behavior During Therapy: WFL for tasks assessed/performed Overall Cognitive Status: Within Functional Limits for tasks assessed                                        Exercises General Exercises - Lower Extremity Quad Sets: Strengthening;Both;10 reps;Supine Long Arc Quad: AROM;Both;10 reps;Seated    General Comments        Pertinent Vitals/Pain Pain Assessment: 0-10 Pain Score: 8  Pain Location: back, BIL Knees Pain Descriptors / Indicators: Discomfort;Constant Pain Intervention(s): Monitored during session;Limited activity within patient's  tolerance;Patient requesting pain meds-RN notified    Home Living                      Prior Function            PT Goals (current goals can now be found in the care plan section) Acute Rehab PT Goals Patient Stated Goal: to get stronger before going back home PT Goal Formulation: With patient Time For Goal Achievement: 01/06/18 Potential to Achieve Goals:  Good Progress towards PT goals: Progressing toward goals    Frequency    Min 3X/week      PT Plan Current plan remains appropriate    Co-evaluation              AM-PAC PT "6 Clicks" Daily Activity  Outcome Measure  Difficulty turning over in bed (including adjusting bedclothes, sheets and blankets)?: A Little Difficulty moving from lying on back to sitting on the side of the bed? : A Little Difficulty sitting down on and standing up from a chair with arms (e.g., wheelchair, bedside commode, etc,.)?: Unable Help needed moving to and from a bed to chair (including a wheelchair)?: A Little Help needed walking in hospital room?: A Little Help needed climbing 3-5 steps with a railing? : A Little 6 Click Score: 16    End of Session Equipment Utilized During Treatment: Gait belt;Oxygen Activity Tolerance: Patient tolerated treatment well Patient left: with call bell/phone within reach;in bed;with nursing/sitter in room Nurse Communication: Mobility status PT Visit Diagnosis: Unsteadiness on feet (R26.81);Muscle weakness (generalized) (M62.81);Pain Pain - Right/Left: (BIL) Pain - part of body: Knee(back)     Time: 7793-9030 PT Time Calculation (min) (ACUTE ONLY): 28 min  Charges:  $Gait Training: 8-22 mins $Therapeutic Exercise: 8-22 mins                    G Codes:       Benjiman Core, [place] Pager [phone] Acute Rehab   Allena Katz 12/25/2017, 12:12 PM

## 2017-12-25 NOTE — Progress Notes (Signed)
CSW met with patient at bedside this morning and gave bed offers. Patient chose [facility]. CSW updated wife by phone. Wife will go to complete admissions paperwork at the facility this afternoon in order to reserve patient's bed there, in anticipation of a possible weekend discharge.   CSW will follow for medical readiness and will support with discharge planning.  Estanislado Emms, [place]

## 2017-12-25 NOTE — Progress Notes (Signed)
OT Cancellation Note  Patient Details Name: Scott Rollins MRN: [id number] DOB: 11-Apr-1951   Cancelled Treatment:    Reason Eval/Treat Not Completed: Fatigue/lethargy limiting ability to participate(Pt politely declining OT, reports not having slept much.) Will continue to follow.  Malka So 12/25/2017, 1:15 PM

## 2017-12-25 NOTE — Progress Notes (Signed)
PROGRESS NOTE    Scott Rollins  WUJ:[id number] DOB: Oct 05, 1950 DOA: 12/21/2017 PCP: [name], MD   Brief Narrative:  67 y.o.WM PMHx  COPD with ongoing tobacco use on home O2 (3L all times); seizure d/o; metastatic prostate CA; pre-DM; afib; OSA on CPAP; morbid obesity; CAD; HTN; HLD; and CAD   Presenting with respiratory distress and confusion.        Subjective: 6/14 A/O x4, negative CP, negative abdominal pain.  Positive chronic S OB (on 3 L O2 home regimen).    Assessment & Plan:   Principal Problem:   Acute on chronic respiratory failure with hypoxia and hypercapnia (HCC) Active Problems:   Essential hypertension   TOBACCO ABUSE   Goals of care, counseling/discussion   Chronic pain syndrome   OSA (obstructive sleep apnea)   Atrial fibrillation (HCC)   Prostate cancer metastatic to bone (HCC)   COPD exacerbation (HCC)   Acute exacerbation of CHF (congestive heart failure) ([place])   Palliative care by specialist   Acute pulmonary edema (HCC)  Acute on chronic respiratory failure with hypoxia/hypercapnia/COPD exacerbation - Titrate O2 to maintain SPO2 89 to 93%  - CPAP at night.  BiPAP if required.   -Mucinex DM  - Complete 7-day course of antibiotics  - DuoNeb 3 times daily -6/13 prednisone 20 mg daily.  Would discharge on steroids and taper slowly to prevent rebound. -6/14 obtain ambulatory SPO2 and record findings and appropriate epic note  OSA/obesity hypoventilation syndrome -See respiratory failure -Discussed case with NCM Kristi although patient will be discharged to SNF will he will use BiPAP and has an old BiPAP at home will be unable to obtain updated machine without updated sleep study.    Tobacco abuse - Patient stated discontinued 2 weeks ago? - Nicotine patch -Counseled patient on absolute need to refrain from future smoking.  Chronic diastolic CHF (dry weight 956 kg) - Strict in and out since admission -12.3 L -Daily weight Filed  Weights   12/23/17 0600 12/24/17 0453 12/25/17 0359  Weight: 267 lb 4.8 oz (121.2 kg) 263 lb 4.8 oz (119.4 kg) 260 lb 6.4 oz (118.1 kg)  -Currently at goal dry weight.  Continue to diurese until patient's Cr increases. - Transfuse for hemoglobin<8  - 81 mg daily -bisoprolol 10 mg daily - Cardizem 60 mg BID -Lasix 80 mg BID -Hydralazine PRN -Lisinopril 10 mg daily  Essential HTN -See CHF  Chronic atrial fibrillation - Rate controlled - Eliquis 5 mg BID  Chronic pain syndrome - Secondary to metastatic prostate cancer to bone -According to the [place] Controlled Substances Reporting System he is receiving medications from only one provider and appears to be taking them as prescribed    Metastatic prostate cancer (to bones) - Diagnosed 2012 metastatic prostate CA -receiving palliative therapy by Dr. Alen Blew.  Oncology -Appears very realistic about his prognosis understanding that he is in his terminal phase.  To remain full code during this admission   Hypokalemia -Potassium goal> 4  Hypomagnesia -Magnesium goal> 2      Goals of care - 6/12 PALLIATIVE CARE:COPD with ongoing tobacco use on home O2 (3L all times); seizure d/o; metastatic prostate CA; pre-DM; afib; OSA on CPAP Evaluate for change of CODE STATUS to DNR, short-term vs long-term goals of care.   DVT prophylaxis: Eliquis Code Status: Full Family Communication: None Disposition Plan: SNF   Consultants:  None     Procedures/Significant Events:  None   I have personally reviewed and interpreted all  radiology studies and my findings are as above.  VENTILATOR SETTINGS: BiPAP   Cultures   Antimicrobials: Anti-infectives (From admission, onward)   Start     Stop   12/23/17 1000  doxycycline (VIBRA-TABS) tablet 100 mg     12/26/17 2359   12/21/17 1500  doxycycline (VIBRAMYCIN) 100 mg in sodium chloride 0.9 % 250 mL IVPB     12/22/17 1833         Devices    LINES / TUBES:         Continuous Infusions:   Objective: Vitals:   12/25/17 0347 12/25/17 0359 12/25/17 0733 12/25/17 0734  BP: (!) 141/76   (!) 144/83  Pulse: 61   (!) 59  Resp: 13   17  Temp: 97.8 F (36.6 C)   98 F (36.7 C)  TempSrc: Oral   Oral  SpO2: 95%  92% 93%  Weight:  260 lb 6.4 oz (118.1 kg)    Height:        Intake/Output Summary (Last 24 hours) at 12/25/2017 0941 Last data filed at 12/25/2017 0600 Gross per 24 hour  Intake 1077 ml  Output 3050 ml  Net -1973 ml   Filed Weights   12/23/17 0600 12/24/17 0453 12/25/17 0359  Weight: 267 lb 4.8 oz (121.2 kg) 263 lb 4.8 oz (119.4 kg) 260 lb 6.4 oz (118.1 kg)    Physical Exam:  General: A/O x4, positive acute on chronic respiratory distress Neck:  Negative scars, masses, torticollis, lymphadenopathy, JVD Lungs: Clear to auscultation bilaterally without wheezes or crackles Cardiovascular: Regular rate and rhythm without murmur gallop or rub normal S1 and S2 Abdomen: Morbidly obese, negative abdominal pain, nondistended, positive soft, bowel sounds, no rebound, no ascites, no appreciable mass Extremities: No significant cyanosis, clubbing, or edema bilateral lower extremities Skin: Negative rashes, lesions, ulcers Psychiatric:  Negative depression, negative anxiety, negative fatigue, negative mania  Central nervous system:  Cranial nerves II through XII intact, tongue/uvula midline, all extremities muscle strength 5/5, sensation intact throughout, negative dysarthria, negative expressive aphasia, negative receptive aphasia.    .     Data Reviewed: Care during the described time interval was provided by me .  I have reviewed this patient's available data, including medical history, events of note, physical examination, and all test results as part of my evaluation.   CBC: Recent Labs  Lab 12/21/17 0531 12/22/17 0535 12/23/17 0410 12/24/17 1024  WBC 13.0* 7.4 10.2 12.6*  NEUTROABS 11.2*  --   --   --   HGB 13.5 12.0*  12.3* 12.9*  HCT 46.7 39.1 39.0 42.5  MCV 96.1 90.7 87.8 90.4  PLT 267 223 255 026   Basic Metabolic Panel: Recent Labs  Lab 12/21/17 0531 12/22/17 0535 12/23/17 0410 12/24/17 1024  NA 143 141 140 142  K 4.7 3.7 4.4 2.9*  CL 94* 88* 85* 82*  CO2 41* 46* 48* >50*  GLUCOSE 132* 118* 115* 105*  BUN 14 18 33* 27*  CREATININE 0.76 0.88 0.92 0.93  CALCIUM 8.6* 8.7* 8.5* 8.6*  MG  --   --   --  1.8   GFR: Estimated Creatinine Clearance: 97.8 mL/min (by C-G formula based on SCr of 0.93 mg/dL). Liver Function Tests: No results for input(s): AST, ALT, ALKPHOS, BILITOT, PROT, ALBUMIN in the last 168 hours. No results for input(s): LIPASE, AMYLASE in the last 168 hours. No results for input(s): AMMONIA in the last 168 hours. Coagulation Profile: No results for input(s): INR, PROTIME in the  last 168 hours. Cardiac Enzymes: Recent Labs  Lab 12/21/17 0531  TROPONINI <0.03   BNP (last 3 results) No results for input(s): PROBNP in the last 8760 hours. HbA1C: No results for input(s): HGBA1C in the last 72 hours. CBG: Recent Labs  Lab 12/24/17 2056 12/25/17 0039  GLUCAP 146* 153*   Lipid Profile: No results for input(s): CHOL, HDL, LDLCALC, TRIG, CHOLHDL, LDLDIRECT in the last 72 hours. Thyroid Function Tests: No results for input(s): TSH, T4TOTAL, FREET4, T3FREE, THYROIDAB in the last 72 hours. Anemia Panel: No results for input(s): VITAMINB12, FOLATE, FERRITIN, TIBC, IRON, RETICCTPCT in the last 72 hours. Urine analysis:    Component Value Date/Time   COLORURINE YELLOW 01/21/2017 1401   APPEARANCEUR HAZY (A) 01/21/2017 1401   LABSPEC 1.028 01/21/2017 1401   PHURINE 5.0 01/21/2017 1401   GLUCOSEU NEGATIVE 01/21/2017 1401   HGBUR SMALL (A) 01/21/2017 1401   HGBUR negative 08/21/2010 0831   BILIRUBINUR NEGATIVE 01/21/2017 1401   KETONESUR NEGATIVE 01/21/2017 1401   PROTEINUR NEGATIVE 01/21/2017 1401   UROBILINOGEN 0.2 08/21/2010 0831   NITRITE NEGATIVE 01/21/2017 1401    LEUKOCYTESUR NEGATIVE 01/21/2017 1401   Sepsis Labs: @LABRCNTIP (procalcitonin:4,lacticidven:4)  ) Recent Results (from the past 240 hour(s))  MRSA PCR Screening     Status: None   Collection Time: 12/22/17  4:20 PM  Result Value Ref Range Status   MRSA by PCR NEGATIVE NEGATIVE Final    Comment:        The GeneXpert MRSA Assay (FDA approved for NASAL specimens only), is one component of a comprehensive MRSA colonization surveillance program. It is not intended to diagnose MRSA infection nor to guide or monitor treatment for MRSA infections. Performed at [facility], [place] [address] [place], [place] [address]          Radiology Studies: No results found.      Scheduled Meds: . apixaban  5 mg Oral BID  . aspirin EC  81 mg Oral Daily  . bisoprolol  10 mg Oral Daily  . citalopram  40 mg Oral QHS  . diltiazem  60 mg Oral Q12H  . docusate sodium  100 mg Oral BID  . doxycycline  100 mg Oral Q12H  . feeding supplement (ENSURE ENLIVE)  237 mL Oral TID BM  . furosemide  80 mg Intravenous Q12H  . gabapentin  300 mg Oral QHS  . ipratropium-albuterol  3 mL Nebulization TID  . lisinopril  10 mg Oral Daily  . nicotine  14 mg Transdermal Daily  . pantoprazole  40 mg Oral Daily  . predniSONE  20 mg Oral Q supper  . sodium chloride flush  3 mL Intravenous Q12H  . tamsulosin  0.4 mg Oral QPC supper   Continuous Infusions:   LOS: 4 days    Time spent: 40 minutes    Naomi Castrogiovanni, Geraldo Docker, MD [facility] Pager [phone]   If 7PM-7AM, please contact night-coverage [email or web address] Password [facility] 12/25/2017, 9:41 AM

## 2017-12-26 LAB — BASIC METABOLIC PANEL
Anion gap: 10 (ref 5–15)
BUN: 29 mg/dL — AB (ref 6–20)
CALCIUM: 8.9 mg/dL (ref 8.9–10.3)
CO2: 45 mmol/L — ABNORMAL HIGH (ref 22–32)
CREATININE: 1.11 mg/dL (ref 0.61–1.24)
Chloride: 86 mmol/L — ABNORMAL LOW (ref 101–111)
GFR calc Af Amer: >60 mL/min (ref >60)
Glucose, Bld: 91 mg/dL (ref 65–99)
Potassium: 3.7 mmol/L (ref 3.5–5.1)
Sodium: 141 mmol/L (ref 135–145)

## 2017-12-26 LAB — MAGNESIUM: Magnesium: 2.3 mg/dL (ref 1.7–2.4)

## 2017-12-26 MED ORDER — DILTIAZEM HCL 60 MG PO TABS: 60.0000 mg | ORAL_TABLET | Freq: Two times a day (BID) | ORAL | 0 refills | Status: AC

## 2017-12-26 MED ORDER — BISOPROLOL FUMARATE 10 MG PO TABS: 10.0000 mg | ORAL_TABLET | Freq: Every day | ORAL | 0 refills | Status: DC

## 2017-12-26 MED ORDER — NICOTINE 14 MG/24HR TD PT24: 14.0000 mg | MEDICATED_PATCH | Freq: Every day | TRANSDERMAL | 0 refills | Status: AC

## 2017-12-26 MED ORDER — FUROSEMIDE 40 MG PO TABS
40.0000 mg | ORAL_TABLET | Freq: Two times a day (BID) | ORAL | Status: DC
  Administered 2017-12-26 – 2017-12-27 (×2): 40 mg via ORAL
  Filled 2017-12-26 (×2): qty 1

## 2017-12-26 MED ORDER — PREDNISONE 20 MG PO TABS: 20.0000 mg | ORAL_TABLET | Freq: Every day | ORAL | 0 refills | Status: DC

## 2017-12-26 MED ORDER — GABAPENTIN 300 MG PO CAPS: 300.0000 mg | ORAL_CAPSULE | Freq: Every day | ORAL | 0 refills | Status: DC

## 2017-12-26 MED ORDER — ASPIRIN 81 MG PO TBEC: 81.0000 mg | DELAYED_RELEASE_TABLET | Freq: Every day | ORAL | 0 refills | Status: AC

## 2017-12-26 MED ORDER — FUROSEMIDE 40 MG PO TABS: 40.0000 mg | ORAL_TABLET | Freq: Two times a day (BID) | ORAL | 0 refills | Status: AC

## 2017-12-26 MED ORDER — MAGNESIUM OXIDE 400 (241.3 MG) MG PO TABS
600.0000 mg | ORAL_TABLET | Freq: Once | ORAL | Status: AC
  Administered 2017-12-26: 600 mg via ORAL
  Filled 2017-12-26: qty 2

## 2017-12-26 MED ORDER — DOXYCYCLINE HYCLATE 100 MG PO TABS: 100.0000 mg | ORAL_TABLET | Freq: Two times a day (BID) | ORAL | 0 refills | Status: DC

## 2017-12-26 NOTE — Progress Notes (Signed)
Pt is ready to be D/C today to [facility]. Received a call from Dr. Sherral Hammers stating that the nursing home is requesting that pt brings all his meds and they need to be sealed. Dr. Sherral Hammers is asking if [facility] pharmacy can verify his cancer meds and sealed them. Contacted Helene Kelp, pharmacist and she stated that they are not able to verify his meds, that usually the facility provides all the meds. [place], SW and discussed med issue. She will contact the facility and will f/u with Dr. Sherral Hammers. Contacted Dr. Sherral Hammers and informed him that [facility] Flattery will f/u and [facility] pharmacy is not able to verify or sealed pt's meds.

## 2017-12-26 NOTE — Progress Notes (Signed)
Pt has decided to return home with the support of his wife and daughter. He has a RW and home O2. Discussed HH and DME. He needs a 3-in-1-BSC. He obtains his home O2 through [facility]. Discussed preference for a [facility] agency. He prefers to use [facility]. Contacted Jermaine for [facility] RN, PT, and an aide and Reggie for DME.

## 2017-12-26 NOTE — Progress Notes (Addendum)
CSW met with pt at bedside.  Patient informed CSW that he will be going home with Home Health instead of SNF.  CSW notified RNCM Jeannette and Dr. Sherral Hammers of patient's request to return home.  No further needs noted at this time.  CSW signing off.  Reed Breech LCSWA [phone]

## 2017-12-26 NOTE — Progress Notes (Signed)
Pt has a discharge order.  Unable to discharge him due to pt being unable to find a ride home. Family is out of town now till tomorrow morning. Dr. Sherral Hammers made aware of above.  Idolina Primer, RN

## 2017-12-26 NOTE — Discharge Summary (Signed)
Physician Discharge Summary  Scott Rollins RKY:[id number] DOB: 1951/03/13 DOA: 12/21/2017  PCP: [name], MD  Admit date: 12/21/2017 Discharge date: 12/26/2017  Time spent: 35 minutes  Recommendations for Outpatient Follow-up:  Acute on chronic respiratory failure with hypoxia/hypercapnia/COPD exacerbation - Titrate O2 to maintain SPO2 89 to 93%  -  BiPAP if required.   - Complete 7-day course of antibiotics  - DuoNeb 3 times daily -6/13 prednisone 20 mg daily.  Would discharge on steroids and taper slowly to prevent rebound. - Follow-up with Dr. Julien Nordmann in 1 week acute on chronic respiratory failure with hypoxia, OSA/OHS, chronic diastolic CHF, hypokalemia/hypomagnesmia    OSA/obesity hypoventilation syndrome -See respiratory failure -Discussed case with [place] Representative Melene Muller will arrange for patient to have BiPAP. -Patient continues to exhibit signs of hypercapnia associated chronic respiratory failure secondary to severe COPD.  Patient requires use NIV both nightly and daytime to help with exacerbation periods.  The use of the NIV will treat patient's high PCO2 levels and to reduce risk of exacerbations and future hospitalization when used at night and during the day.  Patient will need these advanced settings in conjunction with his current medication regimen: BiPAP is not an option due to his functional limitation and the severity of the patient's condition.  Failure to have NIV available for use over a 24-hour period could lead to death.    Tobacco abuse - Patient stated discontinued 2 weeks ago - Nicotine patch -Counseled patient on absolute need to refrain from future smoking.   Chronic diastolic CHF (dry weight 315 kg) - Strict in and out since admission  -16.1 L -Daily weight      Filed Weights    12/23/17 0600 12/24/17 0453 12/25/17 0359  Weight: 267 lb 4.8 oz (121.2 kg) 263 lb 4.8 oz (119.4 kg) 260 lb 6.4 oz (118.1 kg)   -Currently at goal dry weight.   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 Alen Blew.  Oncology -Appears very realistic about his prognosis understanding that he is in his terminal phase.  To remain full code during this admission    Hypokalemia -Potassium goal> 4   Hypomagnesia -Magnesium goal> 2     Discharge Diagnoses:  Principal Problem:   Acute on chronic respiratory failure with hypoxia and hypercapnia (HCC) Active Problems:   Essential hypertension   TOBACCO ABUSE   Goals of care, counseling/discussion   Chronic pain syndrome   OSA (obstructive sleep apnea)   Atrial fibrillation (HCC)   Prostate cancer metastatic to bone (HCC)   COPD exacerbation (HCC)   Acute exacerbation of CHF (congestive heart failure) ([place])   Palliative care by specialist   Acute pulmonary edema [facility])   Discharge Condition: Stable   Diet recommendation: Regular  Filed Weights   12/24/17 0453 12/25/17 0359 12/26/17 0603  Weight: 263 lb 4.8 oz (119.4 kg) 260 lb 6.4 oz (118.1 kg) 259 lb (117.5 kg)    History  of present illness:  67 y.o.WM PMHx  COPD with ongoing tobacco use on home O2 (3L all times); seizure d/o; metastatic prostate CA; pre-DM; afib; OSA on CPAP; morbid obesity; CAD; HTN; HLD; and CAD    Presenting with respiratory distress and confusion.   Treated for acute on chronic respiratory failure with hypoxia and hypercapnia/COPD exacerbation.  Responded  well to antibiotics and COPD exacerbation protocol.  Stable for discharge   Antibiotics Anti-infectives (From admission, onward)   Start     Stop   12/26/17 0000  doxycycline (VIBRA-TABS) 100 MG tablet         12/23/17 1000  doxycycline (VIBRA-TABS) tablet 100 mg     12/26/17 2215   12/21/17 1500  doxycycline (VIBRAMYCIN) 100 mg in sodium chloride 0.9 % 250 mL IVPB     12/22/17 1833       Discharge Exam: Vitals:   12/26/17 0028 12/26/17 0603 12/26/17 0756 12/26/17 1158  BP: 132/66 134/68 126/90 122/76  Pulse: (!) 55 (!) 58 (!) 55 (!) 57  Resp: 12 12 15    Temp: 97.8 F (36.6 C) 97.6 F (36.4 C) 98 F (36.7 C) 98.1 F (36.7 C)  TempSrc: Axillary Axillary Oral Oral  SpO2: 96% 98% 92% 97%  Weight:  259 lb (117.5 kg)    Height:        General: A/O x4, positive acute on chronic respiratory distress Neck:  Negative scars, masses, torticollis, lymphadenopathy, JVD Lungs: Clear to auscultation bilaterally without wheezes or crackles Cardiovascular: Regular rate and rhythm without murmur gallop or rub normal S1 and S2  Discharge Instructions   Allergies as of 12/26/2017      Reactions   Amitriptyline Hcl Nausea Only   REACTION: nausea, elevated bp   Ezetimibe Other (See Comments)   REACTION: chest pain, fatigue      Medication List    STOP taking these medications   abiraterone acetate 250 MG tablet Commonly known as:  ZYTIGA   azithromycin 250 MG tablet Commonly known as:  ZITHROMAX   lisinopril 10 MG tablet Commonly known as:  PRINIVIL,ZESTRIL     TAKE these medications   albuterol 108 (90 Base) MCG/ACT inhaler Commonly known as:  PROAIR HFA Inhale 2 puffs into the lungs every 6 (six) hours as needed for wheezing or shortness of breath.   albuterol (2.5 MG/3ML) 0.083% nebulizer solution Commonly known as:  PROVENTIL Take 3 mLs (2.5 mg total) by nebulization every 4 (four) hours as needed for wheezing or shortness of breath.   allopurinol 300 MG  tablet Commonly known as:  ZYLOPRIM Take 1 tablet (300 mg total) by mouth daily. What changed:    when to take this  reasons to take this   apixaban 5 MG Tabs tablet Commonly known as:  ELIQUIS Take 1 tablet (5 mg total) by mouth 2 (two) times daily.   aspirin 81 MG EC tablet Take 1 tablet (81 mg total) by mouth daily. Start taking on:  12/27/2017 What changed:    medication strength  how much to take   bisoprolol 10 MG tablet Commonly known as:  ZEBETA Take 1 tablet (10 mg total) by mouth daily. Start taking on:  12/27/2017 What changed:    medication strength  how much to take   budesonide-formoterol 160-4.5 MCG/ACT inhaler Commonly known as:  SYMBICORT Inhale 2 puffs into the lungs 2 (two) times daily.   citalopram 40 MG tablet Commonly known as:  CELEXA Take 1 tablet (40 mg total) by mouth  at bedtime.   dextromethorphan-guaiFENesin 30-600 MG 12hr tablet Commonly known as:  MUCINEX DM Take 1 tablet by mouth 2 (two) times daily as needed for cough.   diltiazem 60 MG tablet Commonly known as:  CARDIZEM Take 1 tablet (60 mg total) by mouth every 12 (twelve) hours. What changed:    medication strength  how much to take   doxycycline 100 MG tablet Commonly known as:  VIBRA-TABS Take 1 tablet (100 mg total) by mouth every 12 (twelve) hours.   feeding supplement (ENSURE ENLIVE) Liqd Take 237 mLs by mouth 2 (two) times daily between meals. What changed:    when to take this  additional instructions   furosemide 40 MG tablet Commonly known as:  LASIX Take 1 tablet (40 mg total) by mouth 2 (two) times daily. What changed:  when to take this   gabapentin 300 MG capsule Commonly known as:  NEURONTIN Take 1 capsule (300 mg total) by mouth at bedtime.   nicotine 14 mg/24hr patch Commonly known as:  NICODERM CQ - dosed in mg/24 hours Place 1 patch (14 mg total) onto the skin daily. Start taking on:  12/27/2017   Oxycodone HCl 10 MG Tabs Take 1 tablet  (10 mg total) by mouth every 8 (eight) hours as needed.   pantoprazole 40 MG tablet Commonly known as:  PROTONIX Take 1 tablet (40 mg total) by mouth daily.   potassium chloride SA 20 MEQ tablet Commonly known as:  K-DUR,KLOR-CON Take 1 tablet (20 mEq total) by mouth daily.   predniSONE 20 MG tablet Commonly known as:  DELTASONE Take 1 tablet (20 mg total) by mouth daily with supper. What changed:    medication strength  how much to take  when to take this   PROBIOTIC PO Take 1 capsule by mouth daily.   prochlorperazine 10 MG tablet Commonly known as:  COMPAZINE Take 1 tablet (10 mg total) by mouth every 6 (six) hours as needed for nausea or vomiting.   tamsulosin 0.4 MG Caps capsule Commonly known as:  FLOMAX Take 1 capsule (0.4 mg total) by mouth daily after supper. What changed:    when to take this  reasons to take this   traMADol 50 MG tablet Commonly known as:  ULTRAM Take 1 tablet (50 mg total) by mouth every 6 (six) hours as needed. What changed:  reasons to take this   urea 40 % Crea Commonly known as:  CARMOL Apply to right lower leg once daily      Allergies  Allergen Reactions  . Amitriptyline Hcl Nausea Only    REACTION: nausea, elevated bp  . Ezetimibe Other (See Comments)    REACTION: chest pain, fatigue   Follow-up Information    [name], [name], MD. Schedule an appointment as soon as possible for a visit in 1 week(s).   Specialty:  Family Medicine Why:  Follow-up with Dr. Julien Nordmann in 1 week acute on chronic respiratory failure with hypoxia, OSA/OHS, chronic diastolic CHF, hypokalemia/hypomagnesmia   Contact information: [address] [place] [address] [phone]            The results of significant diagnostics from this hospitalization (including imaging, microbiology, ancillary and laboratory) are listed below for reference.    Significant Diagnostic Studies: Nm Bone Scan Whole Body  Result Date:  12/18/2017 CLINICAL DATA:  Prostate cancer with advanced disease, known bone metastases, post systemic therapy EXAM: NUCLEAR MEDICINE WHOLE BODY BONE SCAN TECHNIQUE: Whole body anterior  and posterior images were obtained approximately 3 hours after intravenous injection of radiopharmaceutical. RADIOPHARMACEUTICALS:  19.3 mCi Technetium-63m MDP IV COMPARISON:  01/23/2017 Radiographic correlation: CT abdomen and pelvis 12/18/2017 FINDINGS: Foci of abnormal increased tracer localization are identified within the thoracic and upper lumbar spine, RIGHT ribs, sternum, and pelvis consistent with osseous metastatic disease. Overall the number of visualized lesions has significantly decreased since the previous exam with decreased tracer uptake at multiple of the previous sites as well. Foci of uptake seen previously at the LEFT femoral diaphysis are no longer identified. Distal RIGHT humerus is not imaged on the current exam, unable to assess previously seen distal RIGHT humeral metastatic lesion. Portion of the proximal LEFT femur is obscured on the anterior view by the patient's hand. Expected urinary tract and soft tissue distribution of tracer. IMPRESSION: Scattered osseous metastases as above with overall decrease in the number and degree of uptake at osseous metastases since the previous exam. Electronically Signed   By: Lavonia Dana M.D.   On: 12/18/2017 14:41   Ct Abdomen Pelvis W Contrast  Result Date: 12/18/2017 CLINICAL DATA:  Metastatic prostate cancer. Ongoing oral chemotherapy. Restaging. EXAM: CT ABDOMEN AND PELVIS WITH CONTRAST TECHNIQUE: Multidetector CT imaging of the abdomen and pelvis was performed using the standard protocol following bolus administration of intravenous contrast. CONTRAST:  122mL ISOVUE-300 IOPAMIDOL (ISOVUE-300) INJECTION 61% COMPARISON:  10/04/2010 CT abdomen/pelvis. FINDINGS: Lower chest: Patchy consolidation, tree-in-bud opacities and ground-glass opacity in dependent basilar lower  lobes bilaterally, left greater than right. 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 Coronary atherosclerosis. Oral contrast in the lower thoracic esophagus suggests esophageal dysmotility and/or gastroesophageal reflux. Trace dependent bilateral pleural effusions. Hepatobiliary: Normal liver size. No liver mass. Normal gallbladder with no radiopaque cholelithiasis. No biliary ductal dilatation. Pancreas: Normal, with no mass or duct dilation. Spleen: Normal size. No mass. Adrenals/Urinary Tract: Mild diffuse thickening of both adrenal glands without discrete adrenal nodules. No hydronephrosis. Exophytic 3.4 cm simple renal cyst in the posterior upper right kidney. Simple 1.9 cm interpolar right renal cyst. No hydronephrosis. Mild diffuse bladder wall thickening. Haziness of the perivesical fat. No significant bladder distention. Stomach/Bowel: Normal non-distended stomach. Normal caliber small bowel with no small bowel wall thickening. Normal appendix. Moderate sigmoid diverticulosis, with no large bowel wall thickening or significant pericolonic fat stranding. Oral contrast progresses to the right colon. Vascular/Lymphatic: Atherosclerotic nonaneurysmal abdominal aorta. Patent portal, splenic and renal veins. Newly mildly enlarged 1.2 cm right common iliac node (series 2/image 63). Mildly enlarged 1.0 cm right external iliac node (series 2/image 79, stable since 10/04/2010. Mildly enlarged 1.5 cm porta hepatis node (series 2/image 28), stable since 2012. No additional pathologically enlarged abdominopelvic nodes. Reproductive: Normal size prostate containing multiple brachytherapy seeds. Other: No pneumoperitoneum, ascites or focal fluid collection. Musculoskeletal: Sclerotic lesions throughout the visualized lower ribs bilaterally have increased in number and sclerosis since 01/21/2017 chest CT. Extensive patchy confluent sclerotic osseous lesions throughout the  lumbar spine, left greater than right pelvic girdle, new since the most recent comparison CT abdomen/pelvis study of 2012. Mild lumbar spondylosis. IMPRESSION: 1. New mild right common iliac lymphadenopathy, suspicious for metastatic disease. Mild right external iliac and porta hepatis adenopathy is stable since 2012. 2. Extensive sclerotic osseous metastases throughout the visualized skeleton, significantly increased since 01/21/2017 chest CT in the lower ribs and new in the lumbar spine and bilateral pelvic girdle since 2012 CT abdomen/pelvis study. 3. Mild diffuse bladder wall thickening. Haziness of the perivesical  fat. Recommend correlation with urinalysis to exclude acute cystitis. No hydronephrosis. 4. Patchy consolidation, tree-in-bud opacities and ground-glass opacity in the dependent basilar lower lobes bilaterally, left greater than right, suggestive of a multilobar bronchopneumonia, possibly due to aspiration. 5. Trace dependent bilateral pleural effusions. Aortic Atherosclerosis (ICD10-I70.0). Electronically Signed   By: Ilona Sorrel M.D.   On: 12/18/2017 10:50   Dg Chest Port 1 View  Result Date: 12/21/2017 CLINICAL DATA:  Shortness of breath tonight. EXAM: PORTABLE CHEST 1 VIEW COMPARISON:  Radiographs 11/16/2017, CT 01/21/2017 FINDINGS: Unchanged cardiomegaly. Unchanged mediastinal contours. Slight increase in pulmonary edema from prior exam. Small right pleural effusion appears similar to prior. No definite left pleural effusion. Bleb in the right lung is unchanged. No pneumothorax or confluent airspace disease. Stable osseous structures. IMPRESSION: Unchanged cardiomegaly and right pleural effusion. Slight increase in pulmonary edema from prior exam. Suspect progressive CHF. Electronically Signed   By: Jeb Levering M.D.   On: 12/21/2017 06:47    Microbiology: Recent Results (from the past 240 hour(s))  MRSA PCR Screening     Status: None   Collection Time: 12/22/17  4:20 PM  Result  Value Ref Range Status   MRSA by PCR NEGATIVE NEGATIVE Final    Comment:        The GeneXpert MRSA Assay (FDA approved for NASAL specimens only), is one component of a comprehensive MRSA colonization surveillance program. It is not intended to diagnose MRSA infection nor to guide or monitor treatment for MRSA infections. Performed at [facility], [place] [address], [place], [place] [address]      Labs: Basic Metabolic Panel: Recent Labs  Lab 12/21/17 0531 12/22/17 0535 12/23/17 0410 12/24/17 1024 12/25/17 0955  NA 143 141 140 142 137  K 4.7 3.7 4.4 2.9* 3.3*  CL 94* 88* 85* 82* 83*  CO2 41* 46* 48* >50* 44*  GLUCOSE 132* 118* 115* 105* 159*  BUN 14 18 33* 27* 27*  CREATININE 0.76 0.88 0.92 0.93 0.97  CALCIUM 8.6* 8.7* 8.5* 8.6* 8.7*  MG  --   --   --  1.8 2.1   Liver Function Tests: No results for input(s): AST, ALT, ALKPHOS, BILITOT, PROT, ALBUMIN in the last 168 hours. No results for input(s): LIPASE, AMYLASE in the last 168 hours. No results for input(s): AMMONIA in the last 168 hours. CBC: Recent Labs  Lab 12/21/17 0531 12/22/17 0535 12/23/17 0410 12/24/17 1024 12/25/17 0955  WBC 13.0* 7.4 10.2 12.6* 11.2*  NEUTROABS 11.2*  --   --   --  9.5*  HGB 13.5 12.0* 12.3* 12.9* 13.4  HCT 46.7 39.1 39.0 42.5 44.3  MCV 96.1 90.7 87.8 90.4 89.9  PLT 267 223 255 297 293   Cardiac Enzymes: Recent Labs  Lab 12/21/17 0531  TROPONINI <0.03   BNP: BNP (last 3 results) Recent Labs    11/16/17 0832 11/17/17 0334 12/21/17 0531  BNP 275.9* 183.7* 300.3*    ProBNP (last 3 results) No results for input(s): PROBNP in the last 8760 hours.  CBG: Recent Labs  Lab 12/24/17 2056 12/25/17 0039  GLUCAP 146* 153*       Signed:  Dia Crawford, MD [facility] [phone] pager

## 2017-12-27 ENCOUNTER — Other Ambulatory Visit: Payer: Self-pay | Admitting: Family Medicine

## 2017-12-27 NOTE — Care Management (Signed)
Pt to go home with NIV.  Jermaine with [facility] has signed paper order from Dr. Sherral Hammers and NIV will be delivered to patient's home tomorrow.  Bedside RN aware.

## 2017-12-27 NOTE — Progress Notes (Signed)
CSW consulted with RN concerning transportation needs for dispostion.  Patient currently has a ride home.  CSW will continue to follow pt.  Reed Breech LCSWA [phone].

## 2017-12-27 NOTE — Progress Notes (Signed)
Pt d/c home per MD order, pt VSS, pt verbalized understanding of d/c, pt transported downstairs with O2, all questions answered

## 2017-12-28 ENCOUNTER — Other Ambulatory Visit: Payer: Self-pay

## 2017-12-28 ENCOUNTER — Telehealth: Payer: Self-pay | Admitting: Pulmonary Disease

## 2017-12-28 DIAGNOSIS — I2510 Atherosclerotic heart disease of native coronary artery without angina pectoris: Secondary | ICD-10-CM | POA: Diagnosis not present

## 2017-12-28 DIAGNOSIS — Z9981 Dependence on supplemental oxygen: Secondary | ICD-10-CM | POA: Diagnosis not present

## 2017-12-28 DIAGNOSIS — C7951 Secondary malignant neoplasm of bone: Secondary | ICD-10-CM | POA: Diagnosis not present

## 2017-12-28 DIAGNOSIS — G40909 Epilepsy, unspecified, not intractable, without status epilepticus: Secondary | ICD-10-CM | POA: Diagnosis not present

## 2017-12-28 DIAGNOSIS — F419 Anxiety disorder, unspecified: Secondary | ICD-10-CM | POA: Diagnosis not present

## 2017-12-28 DIAGNOSIS — R7303 Prediabetes: Secondary | ICD-10-CM | POA: Diagnosis not present

## 2017-12-28 DIAGNOSIS — I110 Hypertensive heart disease with heart failure: Secondary | ICD-10-CM | POA: Diagnosis not present

## 2017-12-28 DIAGNOSIS — G629 Polyneuropathy, unspecified: Secondary | ICD-10-CM | POA: Diagnosis not present

## 2017-12-28 DIAGNOSIS — Z7982 Long term (current) use of aspirin: Secondary | ICD-10-CM | POA: Diagnosis not present

## 2017-12-28 DIAGNOSIS — I5030 Unspecified diastolic (congestive) heart failure: Secondary | ICD-10-CM | POA: Diagnosis not present

## 2017-12-28 DIAGNOSIS — Z7951 Long term (current) use of inhaled steroids: Secondary | ICD-10-CM | POA: Diagnosis not present

## 2017-12-28 DIAGNOSIS — F17210 Nicotine dependence, cigarettes, uncomplicated: Secondary | ICD-10-CM | POA: Diagnosis not present

## 2017-12-28 DIAGNOSIS — I482 Chronic atrial fibrillation: Secondary | ICD-10-CM | POA: Diagnosis not present

## 2017-12-28 DIAGNOSIS — Z7901 Long term (current) use of anticoagulants: Secondary | ICD-10-CM | POA: Diagnosis not present

## 2017-12-28 DIAGNOSIS — G4733 Obstructive sleep apnea (adult) (pediatric): Secondary | ICD-10-CM | POA: Diagnosis not present

## 2017-12-28 DIAGNOSIS — E785 Hyperlipidemia, unspecified: Secondary | ICD-10-CM | POA: Diagnosis not present

## 2017-12-28 DIAGNOSIS — G894 Chronic pain syndrome: Secondary | ICD-10-CM | POA: Diagnosis not present

## 2017-12-28 DIAGNOSIS — J441 Chronic obstructive pulmonary disease with (acute) exacerbation: Secondary | ICD-10-CM | POA: Diagnosis not present

## 2017-12-28 DIAGNOSIS — M1990 Unspecified osteoarthritis, unspecified site: Secondary | ICD-10-CM | POA: Diagnosis not present

## 2017-12-28 DIAGNOSIS — F329 Major depressive disorder, single episode, unspecified: Secondary | ICD-10-CM | POA: Diagnosis not present

## 2017-12-28 DIAGNOSIS — C61 Malignant neoplasm of prostate: Secondary | ICD-10-CM | POA: Diagnosis not present

## 2017-12-28 DIAGNOSIS — Z79891 Long term (current) use of opiate analgesic: Secondary | ICD-10-CM | POA: Diagnosis not present

## 2017-12-28 NOTE — Patient Outreach (Signed)
[facility]) Care Management  12/28/2017  Scott Rollins May 07, 1951 [id number]   RNCM called to follow up. Person answering phone stated client was asleep. HIPPA compliant message left.  Plan continue to follow. Geophysicist/field seismologist. [name], RN, MSN, [place] Coordinator Cell: [phone]

## 2017-12-28 NOTE — Telephone Encounter (Signed)
Spoke with Melissa at [facility]. Pt is needing to be set up on a vent. He had an appointment with Dr. Halford Chessman on 12/21/17 but he ended up being admitted so he didn't make this appointment. While he was in the hospital, they tried to qualify him for a Trilogy vent but they couldn't. Lenna Sciara is requesting that we get the pt in as soon as we can. I spoke with Dr. Halford Chessman and he is willing to see the pt in a 15 min slot. Pt has been scheduled with Dr. Halford Chessman on 12/29/17 at 12pm. Pt is aware of this appointment date and time. Nothing further was needed.

## 2017-12-29 ENCOUNTER — Telehealth: Payer: Self-pay

## 2017-12-29 ENCOUNTER — Ambulatory Visit (INDEPENDENT_AMBULATORY_CARE_PROVIDER_SITE_OTHER): Payer: Medicare Other | Admitting: Pulmonary Disease

## 2017-12-29 ENCOUNTER — Encounter: Payer: Self-pay | Admitting: Pulmonary Disease

## 2017-12-29 ENCOUNTER — Other Ambulatory Visit: Payer: Self-pay | Admitting: Oncology

## 2017-12-29 ENCOUNTER — Telehealth: Payer: Self-pay | Admitting: *Deleted

## 2017-12-29 ENCOUNTER — Telehealth: Payer: Self-pay | Admitting: Family Medicine

## 2017-12-29 VITALS — BP 142/76 | HR 63 | Ht 70.0 in | Wt 253.0 lb

## 2017-12-29 DIAGNOSIS — C61 Malignant neoplasm of prostate: Secondary | ICD-10-CM

## 2017-12-29 DIAGNOSIS — J9611 Chronic respiratory failure with hypoxia: Secondary | ICD-10-CM | POA: Diagnosis not present

## 2017-12-29 DIAGNOSIS — J449 Chronic obstructive pulmonary disease, unspecified: Secondary | ICD-10-CM

## 2017-12-29 DIAGNOSIS — G4733 Obstructive sleep apnea (adult) (pediatric): Secondary | ICD-10-CM

## 2017-12-29 DIAGNOSIS — J9612 Chronic respiratory failure with hypercapnia: Secondary | ICD-10-CM | POA: Diagnosis not present

## 2017-12-29 DIAGNOSIS — C7951 Secondary malignant neoplasm of bone: Principal | ICD-10-CM

## 2017-12-29 MED ORDER — FLUTICASONE-UMECLIDIN-VILANT 100-62.5-25 MCG/INH IN AEPB: 1.0000 | INHALATION_SPRAY | Freq: Once | RESPIRATORY_TRACT | 0 refills | Status: AC

## 2017-12-29 MED ORDER — FLUTICASONE-UMECLIDIN-VILANT 100-62.5-25 MCG/INH IN AEPB: 1.0000 | INHALATION_SPRAY | Freq: Every day | RESPIRATORY_TRACT | 5 refills | Status: AC

## 2017-12-29 NOTE — Progress Notes (Signed)
[facility]  Chief Complaint  Patient presents with  . sleep consult    Pt referred by Dr. Clydene Laming MD. Pt use to have cpap machine, not worn it in years. Pt feels he may need to be back on cpap machine. Pt uses 3 liters pulsed O2 daily.    Vital signs: BP (!) 142/76 (BP Location: Left Arm, Cuff Size: Normal)   Pulse 63   Ht 5\' 10"  (1.778 m)   Wt 253 lb (114.8 kg)   SpO2 90%   BMI 36.30 kg/m   History of Present Illness: Scott Rollins is a 67 y.o. male former smoker with COPD, OSA, and chronic respiratory failure.  He was previously seen by Dr. Gwenette Greet.  He was then seen by Dr. Lake Bells in the hospital.  He had sleep study about 10 yrs ago.  Was on CPAP, but not recently.  He quit smoking years ago.  He was recently started on prednisone and abx for a flare.  Improving.  Uses 3 liters oxygen 24/7.    Follows Dr. Alen Blew for metastatic prostate cancer.   Physical Exam:  General - pleasant, wearing oxygen Eyes - pupils reactive ENT - no sinus tenderness, no oral exudate, no LAN Cardiac - regular, no murmur Chest - no wheeze, rales Abd - soft, non tender Ext - no edema Skin - no rashes Neuro - normal strength Psych - normal mood   Assessment/Plan:  COPD with chronic bronchitis. - complete course of prednisone and ABx - change from symbicort to trelegy - continue prn albuterol  Obstructive sleep apnea. - will arrange for in lab sleep study  Chronic respiratory failure with hypoxia and hypercapnia. - continue 3 liters oxygen 24/7   Patient Instructions  Trelegy 1 puff daily and rinse mouth after each use Stop using symbicort while using trelegy Will arrange for in lab sleep study  Follow up in 3 months    Chesley Mires, MD [place] 12/29/2017, 12:58 PM  Flow Sheet  Pulmonary tests: PFT 04/11/09 >> FEV1 1.67 (49%), FEV1% 58, TLC 5.47 (79%), DLCO 58% ABG 11/16/17 >> pH 7.42, PCO2 67.3, PO2  70.7   Sleep tests:  Cardiac tests: Echo 11/17/17 >> mild LVH, EF 55 to 60%, grade 1 DD  Review of Systems: Constitutional: Positive for appetite change and unexpected weight change. Negative for fever.  HENT: Positive for congestion, postnasal drip, sinus pressure and sneezing. Negative for dental problem, ear pain, nosebleeds, rhinorrhea, sore throat and trouble swallowing.   Eyes: Negative for redness and itching.  Respiratory: Positive for cough, chest tightness, shortness of breath and wheezing.   Cardiovascular: Positive for chest pain, palpitations and leg swelling.  Gastrointestinal: Negative for nausea and vomiting.  Genitourinary: Negative for dysuria.  Musculoskeletal: Negative for joint swelling.  Skin: Negative for rash.  Allergic/Immunologic: Positive for environmental allergies. Negative for food allergies and immunocompromised state.  Neurological: Positive for headaches.  Hematological: Does not bruise/bleed easily.  Psychiatric/Behavioral: Negative for dysphoric mood. The patient is not nervous/anxious.    Past Medical History: He  has a past medical history of Anginal pain ([place]), Anxiety, Arthritis, CAP (community acquired pneumonia) (11/2016), Colon polyp (2006), COPD (chronic obstructive pulmonary disease) ([place]), Coronary atherosclerosis of unspecified type of vessel, native or graft (2005), Depression, Gouty arthritis, Heart murmur, Hematochezia (09/2012), History of hiatal hernia, Hyperkalemia (01/29/2017), Hyperlipidemia, Hypertension, Hypogonadism male, Metastasis from malignant neoplasm of prostate ([place]), Migraine, Mitral valve prolapse (~ 1981), Neuropathic pain of both feet (  2015/2016), Obesities, morbid ([place]), On home oxygen therapy, OSA on CPAP, Osteoarthrosis, unspecified whether generalized or localized, unspecified site, PAF (paroxysmal atrial fibrillation) ([place]) (12/2016), Pre-diabetes (2016/17), Prostate cancer metastatic to multiple sites [facility]) (09/2010;  2018), Radiation, Seizure disorder ([place]) (08-15-2011), Status post chemotherapy, and Tobacco dependence.  Past Surgical History: He  has a past surgical history that includes Colonoscopy with propofol (N/A, 04/05/2013); Hot hemostasis (N/A, 04/05/2013); transthoracic echocardiogram (12/2016); Hernia repair; Umbilical hernia repair; Insertion prostate radiation seed (02/24/2011); Cardiac catheterization (23 caths); Coronary angioplasty with stent; and Coronary angioplasty.  Family History: His family history includes Arthritis in his mother; Bipolar disorder in his father; Heart disease in his father.  Social History: He  reports that he has quit smoking. His smoking use included cigarettes. He has a 82.50 pack-year smoking history. He has never used smokeless tobacco. He reports that he drank alcohol. He reports that he has current or past drug history. Drug: Marijuana.  Medications: Allergies as of 12/29/2017      Reactions   Amitriptyline Hcl Nausea Only   REACTION: nausea, elevated bp   Ezetimibe Other (See Comments)   REACTION: chest pain, fatigue      Medication List        Accurate as of 12/29/17 12:58 PM. Always use your most recent med list.          albuterol 108 (90 Base) MCG/ACT inhaler Commonly known as:  PROAIR HFA Inhale 2 puffs into the lungs every 6 (six) hours as needed for wheezing or shortness of breath.   albuterol (2.5 MG/3ML) 0.083% nebulizer solution Commonly known as:  PROVENTIL Take 3 mLs (2.5 mg total) by nebulization every 4 (four) hours as needed for wheezing or shortness of breath.   allopurinol 300 MG tablet Commonly known as:  ZYLOPRIM Take 1 tablet (300 mg total) by mouth daily.   apixaban 5 MG Tabs tablet Commonly known as:  ELIQUIS Take 1 tablet (5 mg total) by mouth 2 (two) times daily.   aspirin 81 MG EC tablet Take 1 tablet (81 mg total) by mouth daily.   bisoprolol 10 MG tablet Commonly known as:  ZEBETA Take 1 tablet (10 mg total) by  mouth daily.   budesonide-formoterol 160-4.5 MCG/ACT inhaler Commonly known as:  SYMBICORT Inhale 2 puffs into the lungs 2 (two) times daily.   citalopram 40 MG tablet Commonly known as:  CELEXA Take 1 tablet (40 mg total) by mouth at bedtime.   dextromethorphan-guaiFENesin 30-600 MG 12hr tablet Commonly known as:  MUCINEX DM Take 1 tablet by mouth 2 (two) times daily as needed for cough.   diltiazem 60 MG tablet Commonly known as:  CARDIZEM Take 1 tablet (60 mg total) by mouth every 12 (twelve) hours.   doxycycline 100 MG tablet Commonly known as:  VIBRA-TABS Take 1 tablet (100 mg total) by mouth every 12 (twelve) hours.   feeding supplement (ENSURE ENLIVE) Liqd Take 237 mLs by mouth 2 (two) times daily between meals.   Fluticasone-Umeclidin-Vilant 100-62.5-25 MCG/INH Aepb Commonly known as:  TRELEGY ELLIPTA Inhale 1 puff into the lungs daily.   furosemide 40 MG tablet Commonly known as:  LASIX Take 1 tablet (40 mg total) by mouth 2 (two) times daily.   gabapentin 300 MG capsule Commonly known as:  NEURONTIN Take 1 capsule (300 mg total) by mouth at bedtime.   nicotine 14 mg/24hr patch Commonly known as:  NICODERM CQ - dosed in mg/24 hours Place 1 patch (14 mg total) onto the skin daily.  Oxycodone HCl 10 MG Tabs Take 1 tablet (10 mg total) by mouth every 8 (eight) hours as needed.   pantoprazole 40 MG tablet Commonly known as:  PROTONIX TAKE 1 TABLET BY MOUTH EVERY DAY   potassium chloride SA 20 MEQ tablet Commonly known as:  K-DUR,KLOR-CON Take 1 tablet (20 mEq total) by mouth daily.   predniSONE 20 MG tablet Commonly known as:  DELTASONE Take 1 tablet (20 mg total) by mouth daily with supper.   PROBIOTIC PO Take 1 capsule by mouth daily.   prochlorperazine 10 MG tablet Commonly known as:  COMPAZINE Take 1 tablet (10 mg total) by mouth every 6 (six) hours as needed for nausea or vomiting.   tamsulosin 0.4 MG Caps capsule Commonly known as:   FLOMAX Take 1 capsule (0.4 mg total) by mouth daily after supper.   traMADol 50 MG tablet Commonly known as:  ULTRAM Take 1 tablet (50 mg total) by mouth every 6 (six) hours as needed.   urea 40 % Crea Commonly known as:  CARMOL Apply to right lower leg once daily

## 2017-12-29 NOTE — Telephone Encounter (Signed)
Left message for Cheryl to call back

## 2017-12-29 NOTE — Telephone Encounter (Signed)
Results discussed with patient wife. Message to scheduling sent.

## 2017-12-29 NOTE — Telephone Encounter (Signed)
Copied from [place] [id number]. Topic: Quick Communication - See Telephone Encounter >> Dec 29, 2017 11:26 AM Bea Graff, NT wrote: CRM for notification. See Telephone encounter for: 12/29/17. Cheryl with [facility] calling to confirm that Dr. Anitra Lauth is willing to sign orders for nursing and for OT, and bathing and dressing. Also, does Dr. Anitra Lauth want this pt on the diuretic protocol? CB#: [phone]

## 2017-12-29 NOTE — Telephone Encounter (Signed)
Please advise. Thanks.  

## 2017-12-29 NOTE — Progress Notes (Signed)
   Subjective:    Patient ID: Scott Rollins, male    DOB: 1950-08-19, 67 y.o.   MRN: [id number]  HPI    Review of Systems  Constitutional: Positive for appetite change and unexpected weight change. Negative for fever.  HENT: Positive for congestion, postnasal drip, sinus pressure and sneezing. Negative for dental problem, ear pain, nosebleeds, rhinorrhea, sore throat and trouble swallowing.   Eyes: Negative for redness and itching.  Respiratory: Positive for cough, chest tightness, shortness of breath and wheezing.   Cardiovascular: Positive for chest pain, palpitations and leg swelling.  Gastrointestinal: Negative for nausea and vomiting.  Genitourinary: Negative for dysuria.  Musculoskeletal: Negative for joint swelling.  Skin: Negative for rash.  Allergic/Immunologic: Positive for environmental allergies. Negative for food allergies and immunocompromised state.  Neurological: Positive for headaches.  Hematological: Does not bruise/bleed easily.  Psychiatric/Behavioral: Negative for dysphoric mood. The patient is not nervous/anxious.        Objective:   Physical Exam        Assessment & Plan:

## 2017-12-29 NOTE — Telephone Encounter (Signed)
LM requesting call back to complete TCM and schedule hospital f/u.  

## 2017-12-29 NOTE — Telephone Encounter (Signed)
Patient's wife calling, states they were unable to come to [facility]. With dr Alen Blew to discuss scan results, d/t hospitalization. Would like a call with results of scans done on 12/18/17

## 2017-12-29 NOTE — Telephone Encounter (Signed)
Yes, I'll sign orders. Also, he should be on lasix 40mg  bid--I don't know what the [facility] "diuretic protocol" is. -thx

## 2017-12-29 NOTE — Patient Instructions (Signed)
Trelegy 1 puff daily and rinse mouth after each use Stop using symbicort while using trelegy Will arrange for in lab sleep study  Follow up in 3 months

## 2017-12-30 ENCOUNTER — Ambulatory Visit: Payer: Medicare Other | Admitting: Family Medicine

## 2017-12-30 NOTE — Telephone Encounter (Signed)
LM requesting call back to complete TCM and schedule hospital follow up.   

## 2017-12-30 NOTE — Telephone Encounter (Signed)
Scott Rollins called back in to follow up. I did advised of the below. She will be faxing over a copy of the "diuretic protocol" so that Dr. Anitra Lauth can have a copy.

## 2017-12-31 ENCOUNTER — Telehealth: Payer: Self-pay | Admitting: Oncology

## 2017-12-31 ENCOUNTER — Other Ambulatory Visit: Payer: Self-pay

## 2017-12-31 DIAGNOSIS — C61 Malignant neoplasm of prostate: Secondary | ICD-10-CM | POA: Diagnosis not present

## 2017-12-31 DIAGNOSIS — I110 Hypertensive heart disease with heart failure: Secondary | ICD-10-CM | POA: Diagnosis not present

## 2017-12-31 DIAGNOSIS — J441 Chronic obstructive pulmonary disease with (acute) exacerbation: Secondary | ICD-10-CM | POA: Diagnosis not present

## 2017-12-31 DIAGNOSIS — I5030 Unspecified diastolic (congestive) heart failure: Secondary | ICD-10-CM | POA: Diagnosis not present

## 2017-12-31 DIAGNOSIS — C7951 Secondary malignant neoplasm of bone: Secondary | ICD-10-CM | POA: Diagnosis not present

## 2017-12-31 DIAGNOSIS — I2510 Atherosclerotic heart disease of native coronary artery without angina pectoris: Secondary | ICD-10-CM | POA: Diagnosis not present

## 2017-12-31 NOTE — Telephone Encounter (Signed)
Malachy Mood w/ [facility] advised and voiced understanding.

## 2017-12-31 NOTE — Telephone Encounter (Signed)
May do diuretic protocol. "Baseline" dosing of lasix is 40mg  bid.-thx

## 2017-12-31 NOTE — Telephone Encounter (Signed)
Scheduled appt per 6/18 sch message - - pt aware - sent reminder letter in the mail with appt date an time.

## 2017-12-31 NOTE — Telephone Encounter (Signed)
Received fax for diuretic protocol for [facility]. I have placed this on Dr. Idelle Leech desk for review.

## 2017-12-31 NOTE — Patient Outreach (Signed)
[facility]) Care Management  12/31/2017  Scott Rollins 1951-02-28 [id number]   Subjective: "He is doing ok. He is a little better"  Assessment: Referral received 11/20/17. 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  RNCM called to follow up. RNCM spoke with client's wife (on consent)  who states that client is not available/resting. Scott Rollins confirms client is active with home health. She reports client saw pulmonologist on Monday and will be scheduled for a sleep study. No other issues noted at this time.  Plan: follow up next week.  [name], RN, MSN, [place] Coordinator Cell: [phone]

## 2018-01-01 ENCOUNTER — Telehealth: Payer: Self-pay | Admitting: Pulmonary Disease

## 2018-01-01 ENCOUNTER — Telehealth: Payer: Self-pay | Admitting: Family Medicine

## 2018-01-01 ENCOUNTER — Other Ambulatory Visit: Payer: Self-pay | Admitting: *Deleted

## 2018-01-01 DIAGNOSIS — C7951 Secondary malignant neoplasm of bone: Secondary | ICD-10-CM | POA: Diagnosis not present

## 2018-01-01 DIAGNOSIS — I5030 Unspecified diastolic (congestive) heart failure: Secondary | ICD-10-CM | POA: Diagnosis not present

## 2018-01-01 DIAGNOSIS — C61 Malignant neoplasm of prostate: Secondary | ICD-10-CM | POA: Diagnosis not present

## 2018-01-01 DIAGNOSIS — I110 Hypertensive heart disease with heart failure: Secondary | ICD-10-CM | POA: Diagnosis not present

## 2018-01-01 DIAGNOSIS — I2510 Atherosclerotic heart disease of native coronary artery without angina pectoris: Secondary | ICD-10-CM | POA: Diagnosis not present

## 2018-01-01 DIAGNOSIS — J441 Chronic obstructive pulmonary disease with (acute) exacerbation: Secondary | ICD-10-CM | POA: Diagnosis not present

## 2018-01-01 MED ORDER — ALLOPURINOL 300 MG PO TABS: 300.0000 mg | ORAL_TABLET | Freq: Every day | ORAL | 3 refills | Status: AC

## 2018-01-01 NOTE — Telephone Encounter (Signed)
Left message advising wife Rx was sent to pharmacy.

## 2018-01-01 NOTE — Telephone Encounter (Signed)
Already filled in another encounter

## 2018-01-01 NOTE — Telephone Encounter (Signed)
Returned call to [employer], Pulmonary Rehab.  Pulmonary Rehab order was ordered by Dr Anitra Lauth, not [facility] providers.  Nothing further needed.

## 2018-01-01 NOTE — Telephone Encounter (Signed)
LM requesting call back to complete TCM and schedule hospital follow up.   

## 2018-01-01 NOTE — Telephone Encounter (Signed)
Pts wife called requesting Rx for Allopurinol.   [facility]  RF request for allopurinol LOV: 12/02/17 Next ov: None Last written: 12/17/16 #90 w/ 3RF  Please advise. Thanks.

## 2018-01-01 NOTE — Telephone Encounter (Signed)
Copied from [place] [id number]. Topic: Inquiry >> Jan 01, 2018 12:41 PM Pricilla Handler wrote: Reason for CRM: Patient's wife called requesting a refill of allopurinol (ZYLOPRIM) 300 MG tablet. [id number] [place] [place] [phone] (Phone)  [phone] (Fax).     Thank You!!!

## 2018-01-04 ENCOUNTER — Telehealth: Payer: Self-pay | Admitting: Family Medicine

## 2018-01-04 ENCOUNTER — Other Ambulatory Visit: Payer: Self-pay

## 2018-01-04 ENCOUNTER — Other Ambulatory Visit: Payer: Self-pay | Admitting: Family Medicine

## 2018-01-04 ENCOUNTER — Encounter: Payer: Self-pay | Admitting: *Deleted

## 2018-01-04 DIAGNOSIS — I2510 Atherosclerotic heart disease of native coronary artery without angina pectoris: Secondary | ICD-10-CM | POA: Diagnosis not present

## 2018-01-04 DIAGNOSIS — J441 Chronic obstructive pulmonary disease with (acute) exacerbation: Secondary | ICD-10-CM | POA: Diagnosis not present

## 2018-01-04 DIAGNOSIS — I110 Hypertensive heart disease with heart failure: Secondary | ICD-10-CM | POA: Diagnosis not present

## 2018-01-04 DIAGNOSIS — C61 Malignant neoplasm of prostate: Secondary | ICD-10-CM | POA: Diagnosis not present

## 2018-01-04 DIAGNOSIS — I5030 Unspecified diastolic (congestive) heart failure: Secondary | ICD-10-CM | POA: Diagnosis not present

## 2018-01-04 DIAGNOSIS — C7951 Secondary malignant neoplasm of bone: Secondary | ICD-10-CM | POA: Diagnosis not present

## 2018-01-04 MED ORDER — COLCHICINE 0.6 MG PO TABS: 0.6000 mg | ORAL_TABLET | Freq: Two times a day (BID) | ORAL | 6 refills | Status: AC | PRN

## 2018-01-04 NOTE — Patient Outreach (Signed)
[facility]) Care Management   01/04/2018  Scott Rollins March 11, 1951 [id number]  Scott Rollins is an 67 y.o. male  Subjective: "my breathing is fantastic"  Objective: BP 120/70   Ht 1.791 m (5' 10.5")   Wt 260 lb (117.9 kg)   SpO2 91%   BMI 36.78 kg/m    ROS lungs decreased, no wheezing noted. Heart sounds regular, discoloration noted to lower right leg, cream applied. Trace edema noted to feet. Physical Exam skin warm dry, color pale. Respirations even unlabored.  Encounter Medications:   Outpatient Encounter Medications as of 01/04/2018  Medication Sig Note  . albuterol (PROAIR HFA) 108 (90 Base) MCG/ACT inhaler Inhale 2 puffs into the lungs every 6 (six) hours as needed for wheezing or shortness of breath.   Marland Kitchen albuterol (PROVENTIL) (2.5 MG/3ML) 0.083% nebulizer solution Take 3 mLs (2.5 mg total) by nebulization every 4 (four) hours as needed for wheezing or shortness of breath.   . allopurinol (ZYLOPRIM) 300 MG tablet Take 1 tablet (300 mg total) by mouth daily.   Marland Kitchen apixaban (ELIQUIS) 5 MG TABS tablet Take 1 tablet (5 mg total) by mouth 2 (two) times daily.   Marland Kitchen aspirin EC 81 MG EC tablet Take 1 tablet (81 mg total) by mouth daily.   . bisoprolol (ZEBETA) 10 MG tablet Take 1 tablet (10 mg total) by mouth daily.   . budesonide-formoterol (SYMBICORT) 160-4.5 MCG/ACT inhaler Inhale 2 puffs into the lungs 2 (two) times daily.   . citalopram (CELEXA) 40 MG tablet Take 1 tablet (40 mg total) by mouth at bedtime.   Marland Kitchen dextromethorphan-guaiFENesin (MUCINEX DM) 30-600 MG 12hr tablet Take 1 tablet by mouth 2 (two) times daily as needed for cough. 12/01/2017: PRN only  . diltiazem (CARDIZEM) 60 MG tablet Take 1 tablet (60 mg total) by mouth every 12 (twelve) hours.   Marland Kitchen doxycycline (VIBRA-TABS) 100 MG tablet Take 1 tablet (100 mg total) by mouth every 12 (twelve) hours.   . feeding supplement, ENSURE ENLIVE, (ENSURE ENLIVE) LIQD Take 237 mLs by mouth 2 (two) times daily  between meals. (Patient taking differently: Take 237 mLs by mouth. 237 mls --Three to four times per day.)   . Fluticasone-Umeclidin-Vilant (TRELEGY ELLIPTA) 100-62.5-25 MCG/INH AEPB Inhale 1 puff into the lungs daily.   . furosemide (LASIX) 40 MG tablet Take 1 tablet (40 mg total) by mouth 2 (two) times daily.   Marland Kitchen gabapentin (NEURONTIN) 300 MG capsule Take 1 capsule (300 mg total) by mouth at bedtime.   . Oxycodone HCl 10 MG TABS Take 1 tablet (10 mg total) by mouth every 8 (eight) hours as needed. (Patient taking differently: Take 10 mg by mouth every 8 (eight) hours as needed. )   . pantoprazole (PROTONIX) 40 MG tablet TAKE 1 TABLET BY MOUTH EVERY DAY   . predniSONE (DELTASONE) 20 MG tablet Take 1 tablet (20 mg total) by mouth daily with supper.   . Probiotic Product (PROBIOTIC PO) Take 1 capsule by mouth daily. 12/21/2017: Ran out  . prochlorperazine (COMPAZINE) 10 MG tablet Take 1 tablet (10 mg total) by mouth every 6 (six) hours as needed for nausea or vomiting.   . tamsulosin (FLOMAX) 0.4 MG CAPS capsule Take 1 capsule (0.4 mg total) by mouth daily after supper. (Patient taking differently: Take 0.4 mg by mouth as needed (urination). )   . traMADol (ULTRAM) 50 MG tablet Take 1 tablet (50 mg total) by mouth every 6 (six) hours as needed. (Patient taking differently:  Take 50 mg by mouth every 6 (six) hours as needed for moderate pain or severe pain. )   . urea (CARMOL) 40 % CREA Apply to right lower leg once daily   . nicotine (NICODERM CQ - DOSED IN MG/24 HOURS) 14 mg/24hr patch Place 1 patch (14 mg total) onto the skin daily. (Patient not taking: Reported on 01/04/2018)   . potassium chloride SA (K-DUR,KLOR-CON) 20 MEQ tablet Take 1 tablet (20 mEq total) by mouth daily. (Patient not taking: Reported on 01/04/2018)    No facility-administered encounter medications on file as of 01/04/2018.     Functional Status:   In your present state of health, do you have any difficulty performing the  following activities: 12/22/2017 12/02/2017  Hearing? N N  Comment - -  Vision? N N  Comment - -  Difficulty concentrating or making decisions? N Y  Comment - "I have had problems concentrating and remembering".  Walking or climbing stairs? Y Y  Comment - -  Dressing or bathing? Y N  Doing errands, shopping? [name] and eating ? - N  Using the Toilet? - N  In the past six months, have you accidently leaked urine? - Y  Do you have problems with loss of bowel control? - N  Managing your Medications? - N  Managing your Finances? - N  Housekeeping or managing your Housekeeping? - N  Some recent data might be hidden    Fall/Depression Screening:    Fall Risk  12/02/2017 10/12/2017 09/10/2017  Falls in the past year? Yes Yes No  Comment - - -  Number falls in past yr: 2 or more 2 or more -  Injury with Fall? Yes Yes -  Comment Patient reported hitting his head during one fall and hit his side during another fall. - -  Risk Factor Category  High Fall Risk High Fall Risk -  Risk for fall due to : Impaired balance/gait;History of fall(s) Impaired balance/gait -  Risk for fall due to: Comment - - -  Follow up Falls prevention discussed Falls evaluation completed;Education provided -   [facility] 2/9 Scores 12/02/2017 05/05/2017 04/16/2017 02/12/2017 11/13/2015  PHQ - 2 Score 0 2 2 0 3  PHQ- 9 Score - 7 5 - 6    Assessment: Referral received 11/20/17. 67 year old with history of HTN, COPD, heart failure, prostate cancerwith metastasis to the bone.Admitted 6/10-6/16 for COPD/pneumonia  Routine home visit completed. Client reports he is not having any problems with his breathing. Reports cough, but is not bringing anything up.    Active with [facility] for physical and nursing, bath aide.   Recent admission for COPD exacerbation-Client reports he has seen pulmonologist. He reports he was started on Trelogy and feels this has helped. Client reports O2 at 3l/[place]. He  states he is awaiting phone call to scheduled pulmonary rehabilitation and sleep study.  Medications reviewed-Client does not have potassium. He reports his potassium was low when he was in the hospital last admission. [facility] not [facility] any refills and the last time he got prescription filled was 11/05/17. [facility]. RNCM called primary care to inform. Home health nurse arrived during home visit and is aware. RNCM also informed provider office that home health nurse was at client's home if a lab draw is needed.   RNCM discussed palliative care program. Client request call  from [facility] to find out more about the program.  Client with no questions or issues at this time.  Call placed to [facility]-Spoke with [facility] RNCM request someone call client to give more information on the program. .  Plan:  follow up next month.  [name], RN, MSN, [place] Coordinator Cell: [phone]

## 2018-01-04 NOTE — Telephone Encounter (Signed)
Check BMET, dx is chronic diastolic heart failure.-thx

## 2018-01-04 NOTE — Telephone Encounter (Signed)
Legrand Como, PT w/ [facility] advised and voiced understanding.

## 2018-01-04 NOTE — Telephone Encounter (Signed)
Copied from [place] [id number]. Topic: Inquiry >> Jan 04, 2018 11:41 AM Margot Ables wrote: Reason for CRM: [facility] is in pts home. Pt is on a diuretic and was on potassium for 2 weeks back in April. Denton Brick is asking if pt needs to have labs to check potassium or if potassium should be ordered.  [facility] provides SN and may be able to have labs drawn if needed. Please advise.

## 2018-01-04 NOTE — Telephone Encounter (Signed)
Copied from [place] [phone]. Topic: Quick Communication - Rx Refill/Question >> Jan 04, 2018  4:44 PM Oliver Pila B wrote: Medication: diltiazem (CARDIZEM) 60 MG tablet [[id number]] , colchicine 0.6 MG tablet [[id number]]   Pharmacy called about the 2 meds above; is it okay to mix or have both at the same time, call pharmacy to advise

## 2018-01-04 NOTE — Telephone Encounter (Signed)
Copied from [place] [id number]. Topic: General - Other >> Jan 04, 2018 10:42 AM Valla Leaver wrote: Reason for CRM: Legrand Como, PT with [facility] calling to get confirmation that Dr. Anitra Lauth will be overseeing home health for the patient? CB#[phone]

## 2018-01-04 NOTE — Telephone Encounter (Signed)
Left message advising Ms. Scott Rollins that Dr. Anitra Lauth does want blood work done and we will be faxing order over the [facility].   Order faxed.

## 2018-01-04 NOTE — Telephone Encounter (Signed)
Yes, ok 

## 2018-01-04 NOTE — Telephone Encounter (Signed)
Transition Care Management Follow-up Telephone Call  12/21/17-12/27/2017   How have you been since you were released from the hospital? "I'm feeling tough but breathing a lot better"   Do you understand why you were in the hospital? yes   Do you understand the discharge instructions? yes   Where were you discharged to? Home. Family with patient. [place] RN and aides throughout the week.    Items Reviewed:  Medications reviewed: no, pt declined review.  Allergies reviewed: yes  Dietary changes reviewed: yes  Referrals reviewed: yes   Functional Questionnaire:   Activities of Daily Living (ADLs):   He states they are independent in the following: None States they require assistance with the following: ambulation, bathing and hygiene, feeding, continence, grooming, toileting and dressing. Pt states he needs assistance with all ADLs d/t pain and DOE. Currently on Oxygen 3 Liters.    Any transportation issues/concerns?: no   Any patient concerns? yes, pt requesting refill for medication for gout (not allopurinol). Pt was unable to name medication, stating provider will know which one.    Confirmed importance and date/time of follow-up visits scheduled yes  Pt declined to schedule hospital f/u at this time, stating he has too much going on right now with appts and home health.   Confirmed with patient if condition begins to worsen call PCP or go to the ER.  Patient was given the office number and encouraged to call back with question or concerns.  : yes

## 2018-01-04 NOTE — Telephone Encounter (Signed)
[facility] is in pts home. Pt is on a diuretic and was on potassium for 2 weeks back in April. Denton Brick is asking if pt needs to have labs to check potassium or if potassium should be ordered.  [facility] provides SN and may be able to have labs drawn if needed.   Please advise. Thanks.

## 2018-01-04 NOTE — Telephone Encounter (Signed)
OK, colchicine eRx'd 

## 2018-01-04 NOTE — Telephone Encounter (Signed)
Legrand Como, PT with [facility] calling to get confirmation that Dr. Anitra Lauth will be overseeing home health for the patient? CB#[phone]   Please advise. Thanks.

## 2018-01-05 ENCOUNTER — Telehealth: Payer: Self-pay | Admitting: *Deleted

## 2018-01-05 ENCOUNTER — Other Ambulatory Visit: Payer: Self-pay

## 2018-01-05 DIAGNOSIS — I2510 Atherosclerotic heart disease of native coronary artery without angina pectoris: Secondary | ICD-10-CM | POA: Diagnosis not present

## 2018-01-05 DIAGNOSIS — E785 Hyperlipidemia, unspecified: Secondary | ICD-10-CM

## 2018-01-05 DIAGNOSIS — F419 Anxiety disorder, unspecified: Secondary | ICD-10-CM

## 2018-01-05 DIAGNOSIS — M1990 Unspecified osteoarthritis, unspecified site: Secondary | ICD-10-CM | POA: Diagnosis not present

## 2018-01-05 DIAGNOSIS — Z7901 Long term (current) use of anticoagulants: Secondary | ICD-10-CM

## 2018-01-05 DIAGNOSIS — F17210 Nicotine dependence, cigarettes, uncomplicated: Secondary | ICD-10-CM

## 2018-01-05 DIAGNOSIS — Z7951 Long term (current) use of inhaled steroids: Secondary | ICD-10-CM

## 2018-01-05 DIAGNOSIS — I110 Hypertensive heart disease with heart failure: Secondary | ICD-10-CM | POA: Diagnosis not present

## 2018-01-05 DIAGNOSIS — I482 Chronic atrial fibrillation: Secondary | ICD-10-CM | POA: Diagnosis not present

## 2018-01-05 DIAGNOSIS — F329 Major depressive disorder, single episode, unspecified: Secondary | ICD-10-CM | POA: Diagnosis not present

## 2018-01-05 DIAGNOSIS — Z9981 Dependence on supplemental oxygen: Secondary | ICD-10-CM

## 2018-01-05 DIAGNOSIS — G894 Chronic pain syndrome: Secondary | ICD-10-CM | POA: Diagnosis not present

## 2018-01-05 DIAGNOSIS — Z79891 Long term (current) use of opiate analgesic: Secondary | ICD-10-CM

## 2018-01-05 DIAGNOSIS — J441 Chronic obstructive pulmonary disease with (acute) exacerbation: Secondary | ICD-10-CM | POA: Diagnosis not present

## 2018-01-05 DIAGNOSIS — Z7982 Long term (current) use of aspirin: Secondary | ICD-10-CM

## 2018-01-05 DIAGNOSIS — C61 Malignant neoplasm of prostate: Secondary | ICD-10-CM | POA: Diagnosis not present

## 2018-01-05 DIAGNOSIS — I5030 Unspecified diastolic (congestive) heart failure: Secondary | ICD-10-CM | POA: Diagnosis not present

## 2018-01-05 DIAGNOSIS — J449 Chronic obstructive pulmonary disease, unspecified: Secondary | ICD-10-CM | POA: Diagnosis not present

## 2018-01-05 DIAGNOSIS — G40909 Epilepsy, unspecified, not intractable, without status epilepticus: Secondary | ICD-10-CM | POA: Diagnosis not present

## 2018-01-05 DIAGNOSIS — G629 Polyneuropathy, unspecified: Secondary | ICD-10-CM | POA: Diagnosis not present

## 2018-01-05 DIAGNOSIS — G4733 Obstructive sleep apnea (adult) (pediatric): Secondary | ICD-10-CM

## 2018-01-05 DIAGNOSIS — R7303 Prediabetes: Secondary | ICD-10-CM

## 2018-01-05 DIAGNOSIS — C7951 Secondary malignant neoplasm of bone: Secondary | ICD-10-CM | POA: Diagnosis not present

## 2018-01-05 MED ORDER — POTASSIUM CHLORIDE CRYS ER 20 MEQ PO TBCR: EXTENDED_RELEASE_TABLET | ORAL | 1 refills | Status: AC

## 2018-01-05 NOTE — Telephone Encounter (Signed)
Received fax from [facility] for [facility] Certification and Plan of Care, orders for SN. Forms placed on Dr. Isla Pence desk for review.

## 2018-01-05 NOTE — Telephone Encounter (Signed)
Pharmacy calling for authorization to fill both medications, Diltiazem and Colchicine. Will need a return call back to the pharmacy.  Ph[id number] [place], [place] [phone] (Phone) [phone] (Fax)

## 2018-01-06 ENCOUNTER — Telehealth: Payer: Self-pay | Admitting: Family Medicine

## 2018-01-06 ENCOUNTER — Other Ambulatory Visit: Payer: Self-pay | Admitting: Pharmacy Technician

## 2018-01-06 ENCOUNTER — Encounter: Payer: Self-pay | Admitting: Pharmacy Technician

## 2018-01-06 NOTE — Patient Outreach (Signed)
[facility]) Care Management  01/06/2018  Scott Rollins [birth year] [id number]   Successful outreach call to Scott Rollins, HIPAA identifiers verified. Informed him that he had been approved for his Symbicort, Proair and Eliquis and that all 3 companies have updated scripts that he can request when he is running low on meds. Scott Rollins stated that he has been taken off Symbicort and put on Trelegy Ellipta.   Informed patient that I will send him a [place] patient assistance application for Trelegy and faxed Dr. Halford Chessman.  Will follow up with patient in 7-10 business to confirm application has been received.  [name], [place] Management [phone]

## 2018-01-06 NOTE — Telephone Encounter (Signed)
Contacted pharmacy to advise okay for both diltiazem and colchicine.     Okay to fill patient's bisoprolol, last filled by Dr Sherral Hammers?  Please advise.

## 2018-01-06 NOTE — Telephone Encounter (Signed)
Please advise. Thanks.  

## 2018-01-06 NOTE — Telephone Encounter (Signed)
Yes, ok to fill both diltiazem and colchicine.-thx

## 2018-01-06 NOTE — Telephone Encounter (Unsigned)
Copied from [place] [id number]. Topic: Quick Communication - Rx Refill/Question >> Jan 06, 2018  9:15 AM Percell Belt A wrote: Medication:  bisoprolol (ZEBETA) 10 MG tablet [[id number]]  Has the patient contacted their pharmacy? Yes  (Agent: If no, request that the patient contact the pharmacy for the refill.) (Agent: If yes, when and what did the pharmacy advise?)  Preferred Pharmacy (with phone number or street name[facility]   Agent: Please be advised that RX refills may take up to 3 business days. We ask that you follow-up with your pharmacy.

## 2018-01-06 NOTE — Telephone Encounter (Signed)
Yes, RF of bisoprolol is fine too.-thx

## 2018-01-07 ENCOUNTER — Other Ambulatory Visit: Payer: Self-pay | Admitting: *Deleted

## 2018-01-07 DIAGNOSIS — I2510 Atherosclerotic heart disease of native coronary artery without angina pectoris: Secondary | ICD-10-CM | POA: Diagnosis not present

## 2018-01-07 DIAGNOSIS — J441 Chronic obstructive pulmonary disease with (acute) exacerbation: Secondary | ICD-10-CM | POA: Diagnosis not present

## 2018-01-07 DIAGNOSIS — C61 Malignant neoplasm of prostate: Secondary | ICD-10-CM

## 2018-01-07 DIAGNOSIS — C7951 Secondary malignant neoplasm of bone: Secondary | ICD-10-CM | POA: Diagnosis not present

## 2018-01-07 DIAGNOSIS — I110 Hypertensive heart disease with heart failure: Secondary | ICD-10-CM | POA: Diagnosis not present

## 2018-01-07 DIAGNOSIS — I5030 Unspecified diastolic (congestive) heart failure: Secondary | ICD-10-CM | POA: Diagnosis not present

## 2018-01-07 MED ORDER — OXYCODONE HCL 10 MG PO TABS: 10.0000 mg | ORAL_TABLET | Freq: Three times a day (TID) | ORAL | 0 refills | Status: AC | PRN

## 2018-01-07 MED ORDER — TRAMADOL HCL 50 MG PO TABS: 50.0000 mg | ORAL_TABLET | Freq: Four times a day (QID) | ORAL | 0 refills | Status: DC | PRN

## 2018-01-07 MED ORDER — BISOPROLOL FUMARATE 10 MG PO TABS: 10.0000 mg | ORAL_TABLET | Freq: Every day | ORAL | 0 refills | Status: AC

## 2018-01-07 NOTE — Telephone Encounter (Signed)
RX sent to pharmacy  

## 2018-01-08 DIAGNOSIS — C61 Malignant neoplasm of prostate: Secondary | ICD-10-CM | POA: Diagnosis not present

## 2018-01-08 DIAGNOSIS — J441 Chronic obstructive pulmonary disease with (acute) exacerbation: Secondary | ICD-10-CM | POA: Diagnosis not present

## 2018-01-08 DIAGNOSIS — I2510 Atherosclerotic heart disease of native coronary artery without angina pectoris: Secondary | ICD-10-CM | POA: Diagnosis not present

## 2018-01-08 DIAGNOSIS — I5030 Unspecified diastolic (congestive) heart failure: Secondary | ICD-10-CM | POA: Diagnosis not present

## 2018-01-08 DIAGNOSIS — I110 Hypertensive heart disease with heart failure: Secondary | ICD-10-CM | POA: Diagnosis not present

## 2018-01-08 DIAGNOSIS — I5032 Chronic diastolic (congestive) heart failure: Secondary | ICD-10-CM | POA: Diagnosis not present

## 2018-01-08 DIAGNOSIS — C7951 Secondary malignant neoplasm of bone: Secondary | ICD-10-CM | POA: Diagnosis not present

## 2018-01-08 LAB — BASIC METABOLIC PANEL
BUN: 11 (ref 4–21)
Creatinine: 0.8 (ref 0.6–1.3)
GLUCOSE: 75
Potassium: 3.7 (ref 3.4–5.3)
SODIUM: 144 (ref 137–147)

## 2018-01-11 ENCOUNTER — Telehealth: Payer: Self-pay | Admitting: Family Medicine

## 2018-01-11 ENCOUNTER — Encounter: Payer: Self-pay | Admitting: Family Medicine

## 2018-01-11 DIAGNOSIS — C7951 Secondary malignant neoplasm of bone: Secondary | ICD-10-CM | POA: Diagnosis not present

## 2018-01-11 DIAGNOSIS — I2510 Atherosclerotic heart disease of native coronary artery without angina pectoris: Secondary | ICD-10-CM | POA: Diagnosis not present

## 2018-01-11 DIAGNOSIS — C61 Malignant neoplasm of prostate: Secondary | ICD-10-CM | POA: Diagnosis not present

## 2018-01-11 DIAGNOSIS — I110 Hypertensive heart disease with heart failure: Secondary | ICD-10-CM | POA: Diagnosis not present

## 2018-01-11 DIAGNOSIS — J441 Chronic obstructive pulmonary disease with (acute) exacerbation: Secondary | ICD-10-CM | POA: Diagnosis not present

## 2018-01-11 DIAGNOSIS — I5030 Unspecified diastolic (congestive) heart failure: Secondary | ICD-10-CM | POA: Diagnosis not present

## 2018-01-11 MED ORDER — NYSTATIN 100000 UNIT/GM EX CREA: 1.0000 "application " | TOPICAL_CREAM | Freq: Two times a day (BID) | CUTANEOUS | 1 refills | Status: AC

## 2018-01-11 NOTE — Telephone Encounter (Signed)
Copied from [place] [phone]. Topic: Quick Communication - See Telephone Encounter >> Jan 11, 2018  2:24 PM Percell Belt A wrote: CRM for notification. See Telephone encounter for: 01/11/18. Sharrie Rothman with [facility]  [phone] She went out to see pt today and stated that there is a rash on the back of his right thigh.  She stated that I kind of looks like a fungal infection but they think it might be a rash for the antibiotics he was on?   Please advise

## 2018-01-11 NOTE — Telephone Encounter (Signed)
Lets try nystatin cream: I just eRx'd this.

## 2018-01-11 NOTE — Telephone Encounter (Signed)
Sharrie Rothman advised and voiced understanding, she will call pt and let them know.

## 2018-01-11 NOTE — Telephone Encounter (Signed)
Sharrie Rothman with [facility]  [phone] She went out to see pt today and stated that there is a rash on the back of his right thigh.  She stated that I kind of looks like a fungal infection but they think it might be a rash for the antibiotics he was on?   Please advise

## 2018-01-15 DIAGNOSIS — J441 Chronic obstructive pulmonary disease with (acute) exacerbation: Secondary | ICD-10-CM | POA: Diagnosis not present

## 2018-01-15 DIAGNOSIS — I2510 Atherosclerotic heart disease of native coronary artery without angina pectoris: Secondary | ICD-10-CM | POA: Diagnosis not present

## 2018-01-15 DIAGNOSIS — I110 Hypertensive heart disease with heart failure: Secondary | ICD-10-CM | POA: Diagnosis not present

## 2018-01-15 DIAGNOSIS — I5030 Unspecified diastolic (congestive) heart failure: Secondary | ICD-10-CM | POA: Diagnosis not present

## 2018-01-15 DIAGNOSIS — C7951 Secondary malignant neoplasm of bone: Secondary | ICD-10-CM | POA: Diagnosis not present

## 2018-01-15 DIAGNOSIS — C61 Malignant neoplasm of prostate: Secondary | ICD-10-CM | POA: Diagnosis not present

## 2018-01-18 ENCOUNTER — Other Ambulatory Visit: Payer: Self-pay | Admitting: Pharmacy Technician

## 2018-01-18 ENCOUNTER — Ambulatory Visit: Payer: Self-pay

## 2018-01-18 DIAGNOSIS — C7951 Secondary malignant neoplasm of bone: Secondary | ICD-10-CM | POA: Diagnosis not present

## 2018-01-18 DIAGNOSIS — I110 Hypertensive heart disease with heart failure: Secondary | ICD-10-CM | POA: Diagnosis not present

## 2018-01-18 DIAGNOSIS — C61 Malignant neoplasm of prostate: Secondary | ICD-10-CM | POA: Diagnosis not present

## 2018-01-18 DIAGNOSIS — I2510 Atherosclerotic heart disease of native coronary artery without angina pectoris: Secondary | ICD-10-CM | POA: Diagnosis not present

## 2018-01-18 DIAGNOSIS — J441 Chronic obstructive pulmonary disease with (acute) exacerbation: Secondary | ICD-10-CM | POA: Diagnosis not present

## 2018-01-18 DIAGNOSIS — I5030 Unspecified diastolic (congestive) heart failure: Secondary | ICD-10-CM | POA: Diagnosis not present

## 2018-01-18 NOTE — Telephone Encounter (Signed)
Morey Hummingbird (home health nurse) called to report pt.'s ankles measured 2 cm larger than they did last Monday. She reports pt. Did confess to not adhering to low sodium diet this past week. Also, pt. Repots he takes Lasix 40 mg po one time a day. His discharge orders and medication list has Lasix 40 mg twice a day. States he will start taking his Lasix twice a day.Morey Hummingbird will continue to monitor pt.

## 2018-01-18 NOTE — Patient Outreach (Signed)
[facility] [facility]) Care Management  01/18/2018  Scott Rollins 1951/06/27 [id number]   Incoming call from patient to inform me that he received the [place] patient assistance application for Trelegy. Patient stated he would sign it and mail it back into me.  Will refax provider portion of application.  Maud Deed [place], [place] Management [phone]

## 2018-01-19 ENCOUNTER — Other Ambulatory Visit: Payer: Self-pay | Admitting: Pulmonary Disease

## 2018-01-19 DIAGNOSIS — J441 Chronic obstructive pulmonary disease with (acute) exacerbation: Secondary | ICD-10-CM | POA: Diagnosis not present

## 2018-01-19 DIAGNOSIS — I5030 Unspecified diastolic (congestive) heart failure: Secondary | ICD-10-CM | POA: Diagnosis not present

## 2018-01-19 DIAGNOSIS — I110 Hypertensive heart disease with heart failure: Secondary | ICD-10-CM | POA: Diagnosis not present

## 2018-01-19 DIAGNOSIS — C7951 Secondary malignant neoplasm of bone: Secondary | ICD-10-CM | POA: Diagnosis not present

## 2018-01-19 DIAGNOSIS — I2510 Atherosclerotic heart disease of native coronary artery without angina pectoris: Secondary | ICD-10-CM | POA: Diagnosis not present

## 2018-01-19 DIAGNOSIS — C61 Malignant neoplasm of prostate: Secondary | ICD-10-CM | POA: Diagnosis not present

## 2018-01-19 MED ORDER — FLUTICASONE-UMECLIDIN-VILANT 100-62.5-25 MCG/INH IN AEPB: 1.0000 | INHALATION_SPRAY | RESPIRATORY_TRACT | 6 refills | Status: AC

## 2018-01-20 ENCOUNTER — Telehealth: Payer: Self-pay

## 2018-01-20 ENCOUNTER — Encounter (HOSPITAL_BASED_OUTPATIENT_CLINIC_OR_DEPARTMENT_OTHER): Payer: Medicare Other

## 2018-01-20 NOTE — Telephone Encounter (Signed)
Spoke with pt over the phone yesterday regarding changes in pain level and management. Pain has in the past been mostly in L hip and femur, but is now more so in the L arm, neck, and some of the "rib area". Pt described sharp pain at 10/10 when pain medication wears off. His R side has also started to become sensitive to touch. Pt medication ordered for oxycodone 10mg  1 tablet by mouth every 8 hours as needed and tramadol 50mg  1 tablet by mouth every 6 hours as needed.  Discussed changes with Dr. Alen Blew and pt need for more pain coverage. Per Dr. Alen Blew, okay for pt to take oxycodone 1-2 tablets (10-20mg ) by mouth every 4 hours as needed for pain and tramadol 50mg  1 tablet by mouth every 6 hours as needed for pain. Pt verbalized understanding of change.  Concern about refilling medication. Pt said he will probably need a refill on the oxycodone in the next two weeks. With past prescription that is still in the system. Refill would be available in a week and a half. Pt verbalized understanding.

## 2018-01-20 NOTE — Telephone Encounter (Signed)
Noted. Agree with plan.

## 2018-01-21 DIAGNOSIS — I110 Hypertensive heart disease with heart failure: Secondary | ICD-10-CM | POA: Diagnosis not present

## 2018-01-21 DIAGNOSIS — C61 Malignant neoplasm of prostate: Secondary | ICD-10-CM | POA: Diagnosis not present

## 2018-01-21 DIAGNOSIS — I5030 Unspecified diastolic (congestive) heart failure: Secondary | ICD-10-CM | POA: Diagnosis not present

## 2018-01-21 DIAGNOSIS — J441 Chronic obstructive pulmonary disease with (acute) exacerbation: Secondary | ICD-10-CM | POA: Diagnosis not present

## 2018-01-21 DIAGNOSIS — I2510 Atherosclerotic heart disease of native coronary artery without angina pectoris: Secondary | ICD-10-CM | POA: Diagnosis not present

## 2018-01-21 DIAGNOSIS — C7951 Secondary malignant neoplasm of bone: Secondary | ICD-10-CM | POA: Diagnosis not present

## 2018-01-22 ENCOUNTER — Other Ambulatory Visit: Payer: Self-pay | Admitting: Pharmacy Technician

## 2018-01-22 NOTE — Patient Outreach (Signed)
[facility]) Care Management  01/22/2018  Scott Rollins August 24, 1950 [id number]   Received patient portion of [place] patient assistance application. Faxed completed application and all required documents into [employer].  Will follow up with [place] in 2-3 business days.  Maud Deed [place], [place] Management [phone]

## 2018-01-25 ENCOUNTER — Other Ambulatory Visit: Payer: Self-pay | Admitting: Pharmacy Technician

## 2018-01-25 NOTE — Patient Outreach (Signed)
[facility]) Care Management  01/25/2018  Scott Rollins May 01, 1951 [id number]   Follow up call to [place] to check status of patients application for Trelegy inhaler. Scott Rollins stated patient has been approved as of 01/25/2018 until 01/24/2019.  Successful outreach call to Scott Rollins, HIPAA identifiers verified. Informed Scott Rollins that [name] has been approved for Trelegy for a year. Also informed her that medication should be delivered in the next 7-10 business days.  Will follow up with patient in 10-14 business days to confirm medication has been delivered.  Scott Rollins, [place] Management [phone]

## 2018-01-26 DIAGNOSIS — I110 Hypertensive heart disease with heart failure: Secondary | ICD-10-CM | POA: Diagnosis not present

## 2018-01-26 DIAGNOSIS — I2510 Atherosclerotic heart disease of native coronary artery without angina pectoris: Secondary | ICD-10-CM | POA: Diagnosis not present

## 2018-01-26 DIAGNOSIS — C7951 Secondary malignant neoplasm of bone: Secondary | ICD-10-CM | POA: Diagnosis not present

## 2018-01-26 DIAGNOSIS — C61 Malignant neoplasm of prostate: Secondary | ICD-10-CM | POA: Diagnosis not present

## 2018-01-26 DIAGNOSIS — I5030 Unspecified diastolic (congestive) heart failure: Secondary | ICD-10-CM | POA: Diagnosis not present

## 2018-01-26 DIAGNOSIS — J441 Chronic obstructive pulmonary disease with (acute) exacerbation: Secondary | ICD-10-CM | POA: Diagnosis not present

## 2018-01-27 ENCOUNTER — Other Ambulatory Visit: Payer: Self-pay | Admitting: *Deleted

## 2018-01-27 DIAGNOSIS — I2510 Atherosclerotic heart disease of native coronary artery without angina pectoris: Secondary | ICD-10-CM | POA: Diagnosis not present

## 2018-01-27 DIAGNOSIS — C7951 Secondary malignant neoplasm of bone: Secondary | ICD-10-CM | POA: Diagnosis not present

## 2018-01-27 DIAGNOSIS — I5030 Unspecified diastolic (congestive) heart failure: Secondary | ICD-10-CM | POA: Diagnosis not present

## 2018-01-27 DIAGNOSIS — J441 Chronic obstructive pulmonary disease with (acute) exacerbation: Secondary | ICD-10-CM | POA: Diagnosis not present

## 2018-01-27 DIAGNOSIS — I110 Hypertensive heart disease with heart failure: Secondary | ICD-10-CM | POA: Diagnosis not present

## 2018-01-27 DIAGNOSIS — C61 Malignant neoplasm of prostate: Secondary | ICD-10-CM | POA: Diagnosis not present

## 2018-01-27 MED ORDER — OXYCODONE HCL 10 MG PO TABS: 10.0000 mg | ORAL_TABLET | ORAL | 0 refills | Status: DC | PRN

## 2018-01-27 MED ORDER — TRAMADOL HCL 50 MG PO TABS: 50.0000 mg | ORAL_TABLET | Freq: Four times a day (QID) | ORAL | 0 refills | Status: DC | PRN

## 2018-01-27 MED ORDER — ABIRATERONE ACETATE 250 MG PO TABS: 1000.0000 mg | ORAL_TABLET | Freq: Every day | ORAL | 0 refills | Status: AC

## 2018-01-28 ENCOUNTER — Telehealth: Payer: Self-pay | Admitting: Oncology

## 2018-01-28 DIAGNOSIS — I2510 Atherosclerotic heart disease of native coronary artery without angina pectoris: Secondary | ICD-10-CM | POA: Diagnosis not present

## 2018-01-28 DIAGNOSIS — C61 Malignant neoplasm of prostate: Secondary | ICD-10-CM | POA: Diagnosis not present

## 2018-01-28 DIAGNOSIS — C7951 Secondary malignant neoplasm of bone: Secondary | ICD-10-CM | POA: Diagnosis not present

## 2018-01-28 DIAGNOSIS — I5030 Unspecified diastolic (congestive) heart failure: Secondary | ICD-10-CM | POA: Diagnosis not present

## 2018-01-28 DIAGNOSIS — J441 Chronic obstructive pulmonary disease with (acute) exacerbation: Secondary | ICD-10-CM | POA: Diagnosis not present

## 2018-01-28 DIAGNOSIS — I110 Hypertensive heart disease with heart failure: Secondary | ICD-10-CM | POA: Diagnosis not present

## 2018-01-28 NOTE — Telephone Encounter (Signed)
Patient called to r/s lab to the same day as office visit and inj. Patient requested all appts to be on the same day and refuses to come in 2 different days.

## 2018-02-01 ENCOUNTER — Other Ambulatory Visit: Payer: Self-pay

## 2018-02-01 NOTE — Patient Outreach (Signed)
[facility] [facility]) Care Management  02/01/2018  Scott Rollins 1950-07-25 [id number]   RNCM called to follow up. No answer. HIPPA compliant message left.  Plan: RNCM will send unsuccessful outreach letter and follow up call within 3-4 business days if no return call.  [name], RN, MSN, [place] Coordinator Cell: [phone]

## 2018-02-04 ENCOUNTER — Other Ambulatory Visit: Payer: Self-pay | Admitting: Family Medicine

## 2018-02-04 ENCOUNTER — Other Ambulatory Visit: Payer: Self-pay

## 2018-02-04 NOTE — Telephone Encounter (Signed)
Looks like this has been d/c. Please advise. Thanks.

## 2018-02-04 NOTE — Patient Outreach (Signed)
[facility]) Care Management  02/04/2018  Scott Rollins 05-19-1951 [id number]   Referral received 11/20/17. 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  RNCM called to follow up. The person answering the phone stated client was resting. HIPPA compliant message left-confirmed client has RNCM's contact number.  Plan follow up within the next 3-4 business days.  [name], RN, MSN, [place] Coordinator Cell: [phone]

## 2018-02-04 NOTE — Telephone Encounter (Signed)
Yes, oncologist d/c'd this due to pt's bp being too low.

## 2018-02-08 ENCOUNTER — Telehealth: Payer: Self-pay | Admitting: *Deleted

## 2018-02-08 NOTE — Telephone Encounter (Signed)
Patient calling to say he is going out of town for a week and a half and will run out of oxycodone. States he is taking 2 tablets every 4 hours and only got 90 tabs last time. Would like a refill and more than 90 tablets if possible.

## 2018-02-09 ENCOUNTER — Other Ambulatory Visit: Payer: Self-pay | Admitting: *Deleted

## 2018-02-09 ENCOUNTER — Telehealth: Payer: Self-pay | Admitting: *Deleted

## 2018-02-09 ENCOUNTER — Other Ambulatory Visit: Payer: Self-pay

## 2018-02-09 MED ORDER — OXYCODONE HCL 10 MG PO TABS: 10.0000 mg | ORAL_TABLET | ORAL | 0 refills | Status: DC | PRN

## 2018-02-09 NOTE — Telephone Encounter (Signed)
Ok to refill #180 

## 2018-02-09 NOTE — Patient Outreach (Signed)
[facility] [facility]) Care Management  02/09/2018  Scott Rollins 1951-03-17 [id number]  Referral received 11/20/17. 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 RNCM called to follow up. Client reports he did not sleep well last night, but states that he is doing better now.   History of COPD: client states, "my breathing is fantastic".    Regarding pain, client reports it is better. He reports his pain is controlled, adding his pain is tolerable at a "5" which is his new pain goal.   He states he has all his medications and does not have any questions about medications at this time.    Client reports he was discharged from home health last week and states Physical therapy has helped and he can walk better than I could.   Client reports he has not heard from the care connections program. RNCM called [facility] program again and re-referred.  He reports, he is finally feeling well enough to go to the beach for about a week.  Plan: follow up telephonically next month.  [name], RN, MSN, [place] Coordinator Cell: [phone]

## 2018-02-09 NOTE — Telephone Encounter (Signed)
Left message on answering machine that requested refill script for oxycodone was ready for p/u.

## 2018-02-10 ENCOUNTER — Telehealth: Payer: Self-pay | Admitting: Family Medicine

## 2018-02-10 DIAGNOSIS — I110 Hypertensive heart disease with heart failure: Secondary | ICD-10-CM | POA: Diagnosis not present

## 2018-02-10 DIAGNOSIS — I2510 Atherosclerotic heart disease of native coronary artery without angina pectoris: Secondary | ICD-10-CM | POA: Diagnosis not present

## 2018-02-10 DIAGNOSIS — J441 Chronic obstructive pulmonary disease with (acute) exacerbation: Secondary | ICD-10-CM | POA: Diagnosis not present

## 2018-02-10 DIAGNOSIS — I5030 Unspecified diastolic (congestive) heart failure: Secondary | ICD-10-CM | POA: Diagnosis not present

## 2018-02-10 DIAGNOSIS — C61 Malignant neoplasm of prostate: Secondary | ICD-10-CM | POA: Diagnosis not present

## 2018-02-10 DIAGNOSIS — C7951 Secondary malignant neoplasm of bone: Secondary | ICD-10-CM | POA: Diagnosis not present

## 2018-02-10 NOTE — Telephone Encounter (Signed)
Copied from [place] [phone]. Topic: Inquiry >> Feb 10, 2018  2:00 PM Scherrie Gerlach wrote: Reason for CRM: Morey Hummingbird with [facility] calling to report that at pt's visit he reported chest pain around 4 am. Pt took a nitro pill and he felt better. She also asked for his weight readings. Pt was very inconsistent with his readings. Pt states "about this and about that, anywhere from 260 lbs, 240 lbs. His weight today was 250 lbs.  Morey Hummingbird states pt is non compliant with his meds, not taking his lasix.  She went over the dangers of not taking his meds. Pt verbalized understanding.  Would not take today because he had to go somewhere, but would take when he got home. Morey Hummingbird wants the dr to be aware, in case anything should happen.  Pt has one more visit in 2 weeks, then will be discharged.

## 2018-02-10 NOTE — Telephone Encounter (Signed)
Yes, this is fine.-thx 

## 2018-02-10 NOTE — Telephone Encounter (Signed)
Please advise. Thanks.  

## 2018-02-10 NOTE — Telephone Encounter (Signed)
Scott Rollins w/ [facility] advised and voiced understanding.

## 2018-02-10 NOTE — Telephone Encounter (Signed)
Copied from [place] [phone]. Topic: Quick Communication - See Telephone Encounter >> Feb 10, 2018  9:17 AM Percell Belt A wrote: CRM for notification. See Telephone encounter for: 02/10/18. Manus Gunning with [facility] Need verbal to start palliative care  [phone]

## 2018-02-11 ENCOUNTER — Telehealth: Payer: Self-pay | Admitting: Pharmacist

## 2018-02-11 ENCOUNTER — Other Ambulatory Visit: Payer: Self-pay | Admitting: Pharmacy Technician

## 2018-02-11 ENCOUNTER — Encounter: Payer: Self-pay | Admitting: Pharmacist

## 2018-02-11 ENCOUNTER — Telehealth: Payer: Self-pay | Admitting: Family Medicine

## 2018-02-11 NOTE — Telephone Encounter (Signed)
FYI

## 2018-02-11 NOTE — Telephone Encounter (Signed)
Copied from [place] [phone]. Topic: Inquiry >> Feb 11, 2018  3:09 PM Scherrie Gerlach wrote: Reason for CRM: carrie with [facility] calling for a PT eval for the pt to see if anything they can help him with. One time visit

## 2018-02-11 NOTE — Patient Outreach (Signed)
[facility]) Care Management  02/11/2018  Scott Rollins 08/16/1950 [id number]   Pharmacy case closure as the patient received his medications from patient assistance programs and understands how to reorder.  Patient is still active with [facility] Case Management.  Plan: Alert [facility] Community Nurse, [name] of pharmacy case closure.   [name], PharmD, [place] Clinical Pharmacist [phone]

## 2018-02-11 NOTE — Patient Outreach (Signed)
[facility]) Care Management  02/11/2018  Scott Rollins 04-30-1951 [id number]   Successful outreach call to patients wife, Scott Rollins, [place] identifiers verified. [name] confirmed patient received his Trelegy inhalers from [place] patient assistance. Reviewed with her how to obtain refills from company.  Will route note to [place] for case closure.  Maud Deed [place], [place] Management [phone]

## 2018-02-11 NOTE — Telephone Encounter (Signed)
Please advise. Thanks.  

## 2018-02-11 NOTE — Telephone Encounter (Signed)
Carrie advised and voiced understanding. 

## 2018-02-11 NOTE — Telephone Encounter (Signed)
Yes, this is fine.

## 2018-02-11 NOTE — Telephone Encounter (Signed)
Noted. This is consistent with the way Mr. Loya has been since he has been my patient. Signed:  Crissie Sickles, MD           02/11/2018

## 2018-02-15 ENCOUNTER — Telehealth: Payer: Self-pay

## 2018-02-15 DIAGNOSIS — I5030 Unspecified diastolic (congestive) heart failure: Secondary | ICD-10-CM | POA: Diagnosis not present

## 2018-02-15 DIAGNOSIS — I2510 Atherosclerotic heart disease of native coronary artery without angina pectoris: Secondary | ICD-10-CM | POA: Diagnosis not present

## 2018-02-15 DIAGNOSIS — C61 Malignant neoplasm of prostate: Secondary | ICD-10-CM | POA: Diagnosis not present

## 2018-02-15 DIAGNOSIS — J441 Chronic obstructive pulmonary disease with (acute) exacerbation: Secondary | ICD-10-CM | POA: Diagnosis not present

## 2018-02-15 DIAGNOSIS — C7951 Secondary malignant neoplasm of bone: Secondary | ICD-10-CM | POA: Diagnosis not present

## 2018-02-15 DIAGNOSIS — I110 Hypertensive heart disease with heart failure: Secondary | ICD-10-CM | POA: Diagnosis not present

## 2018-02-15 NOTE — Telephone Encounter (Signed)
Copied from [place] [phone]. Topic: Inquiry >> Feb 15, 2018  4:48 PM Tye [place] wrote: Reason for CRM: [facility] called to report a fall that happened on Saturday; injury on right lower leg; calling for wound care orders contact [phone]

## 2018-02-15 NOTE — Telephone Encounter (Signed)
Pls call and see if they just need verbal order to follow their wound care protocol.  If so, this is fine. If they want me to write some kind of order for them, I will need to see him in the office.-thx

## 2018-02-16 NOTE — Telephone Encounter (Signed)
Noted agree

## 2018-02-16 NOTE — Telephone Encounter (Signed)
Non stick dressing with hydrogel, wrapped with curelex order given to [facility] with wound care. Request for Palliative care, eval. And treat was verbally ordered per Dr. Anitra Lauth by request from [place] with wound care.

## 2018-02-17 ENCOUNTER — Telehealth: Payer: Self-pay | Admitting: Pharmacist

## 2018-02-17 NOTE — Telephone Encounter (Signed)
Oral Oncology Pharmacist Encounter  Completed application for re-enrollment into the [place] ([employer]) in an effort to receive Zytiga at $0 out of pocket cost to patient through manufacturer compassionate use program faxed to [employer] at [phone]  I called and updated patient's wife, Shauna Hugh, that I had received their paperwork and faxed application to the manufacturer.  All questions answered. [name] expressed understanding and appreciation.  This encounter will continue to be updated until final determination.  Johny Drilling, PharmD, BCPS, BCOP  02/17/2018 12:54 PM Oral Oncology Clinic [phone]

## 2018-02-18 ENCOUNTER — Other Ambulatory Visit: Payer: Self-pay | Admitting: Family Medicine

## 2018-02-18 NOTE — Telephone Encounter (Signed)
Looks like this was changed by another provider. Please advise. Thanks.

## 2018-02-19 ENCOUNTER — Other Ambulatory Visit: Payer: Self-pay | Admitting: Oncology

## 2018-02-22 DIAGNOSIS — I110 Hypertensive heart disease with heart failure: Secondary | ICD-10-CM | POA: Diagnosis not present

## 2018-02-22 DIAGNOSIS — C7951 Secondary malignant neoplasm of bone: Secondary | ICD-10-CM | POA: Diagnosis not present

## 2018-02-22 DIAGNOSIS — I5030 Unspecified diastolic (congestive) heart failure: Secondary | ICD-10-CM | POA: Diagnosis not present

## 2018-02-22 DIAGNOSIS — I2510 Atherosclerotic heart disease of native coronary artery without angina pectoris: Secondary | ICD-10-CM | POA: Diagnosis not present

## 2018-02-22 DIAGNOSIS — C61 Malignant neoplasm of prostate: Secondary | ICD-10-CM | POA: Diagnosis not present

## 2018-02-22 DIAGNOSIS — J441 Chronic obstructive pulmonary disease with (acute) exacerbation: Secondary | ICD-10-CM | POA: Diagnosis not present

## 2018-02-23 ENCOUNTER — Telehealth: Payer: Self-pay

## 2018-02-23 DIAGNOSIS — J441 Chronic obstructive pulmonary disease with (acute) exacerbation: Secondary | ICD-10-CM | POA: Diagnosis not present

## 2018-02-23 DIAGNOSIS — C61 Malignant neoplasm of prostate: Secondary | ICD-10-CM | POA: Diagnosis not present

## 2018-02-23 DIAGNOSIS — I2510 Atherosclerotic heart disease of native coronary artery without angina pectoris: Secondary | ICD-10-CM | POA: Diagnosis not present

## 2018-02-23 DIAGNOSIS — C7951 Secondary malignant neoplasm of bone: Secondary | ICD-10-CM | POA: Diagnosis not present

## 2018-02-23 DIAGNOSIS — I5030 Unspecified diastolic (congestive) heart failure: Secondary | ICD-10-CM | POA: Diagnosis not present

## 2018-02-23 DIAGNOSIS — I110 Hypertensive heart disease with heart failure: Secondary | ICD-10-CM | POA: Diagnosis not present

## 2018-02-23 NOTE — Telephone Encounter (Signed)
Left message for [facility] to call back.

## 2018-02-23 NOTE — Telephone Encounter (Signed)
Cary w/ [facility] called to let Dr. Anitra Lauth know that today was her last visit w/ pt, he is being d/c since palliative care has started visiting w/ pt.  She also wanted to report pts BP readings...  1st reading was 200/116, pt had not taking his lasix or BP medications, she had pt sit up and take his medications and rechecked his BP a few minutes later and it was 180/104.  She advised pt of the importance of taking his medications as prescribed. She stated that pt voiced understanding.

## 2018-02-23 NOTE — Telephone Encounter (Signed)
Copied from [place] [phone]. Topic: General - Other >> Feb 23, 2018 12:36 PM Yvette Rack wrote: Reason for CRM: [name] with [place] requests call back from Dr Idelle Leech nurse. Cb# [phone]

## 2018-02-24 NOTE — Telephone Encounter (Signed)
Tracey, palliative care nurse with [facility] calling to report BP readings and  +1 edema in BLE. Linus Orn states that this is her first visit with the pt and manual BP was 210/110 in the left arm and 190/108 in right arm. Pt currently denying chest pain, dizziness, headaches or blurry vision.Linus Orn states that the pt is diaphoretic at this time which the pt states is normal for him with taking oxycodone. Pt states he has a lot of pain and is currently rating his pain 7 on a scale of 1-10 and has already taken a dose of oxycodone today. Pt reports that he has taken his BP medications today and also took a dose of Furosemide 40mg  prior to call and earlier this morning. Linus Orn can be contacted at [phone].

## 2018-02-25 NOTE — Telephone Encounter (Signed)
SW Olivia Mackie she stated that pts HR was 84. She stated that she does not know if pt is taking his medications or what he is taking.   Left message for pt to call back.

## 2018-02-25 NOTE — Telephone Encounter (Signed)
Left message for Tracy to call back.

## 2018-02-25 NOTE — Telephone Encounter (Signed)
Pls check with the nurse about heart rate numbers with the blood pressures she reported. Also, I am not entirely clear on his med regimen at this time, so please clarify: is he taking diltiazeam, bisoprolol, and lisinopril? Let me know and I'll go from there regarding bp med adjustments or additions. Definitely sounds like he needs to discuss pain control with oncology.  Looks like he hasn't seen them for o/v in a few months. He also needs f/u with me in 1 wk or so to review bp control and check his electrolytes and kidney function.-thx

## 2018-03-01 ENCOUNTER — Other Ambulatory Visit: Payer: Self-pay

## 2018-03-01 ENCOUNTER — Telehealth: Payer: Self-pay

## 2018-03-01 MED ORDER — OXYCODONE HCL 10 MG PO TABS: 10.0000 mg | ORAL_TABLET | ORAL | 0 refills | Status: AC | PRN

## 2018-03-01 NOTE — Telephone Encounter (Signed)
Received a message requesting a refill on the Oxycodone. Spoke with Dr. Alen Blew and left message for the patient that the prescription is ready for pick up.

## 2018-03-02 ENCOUNTER — Telehealth: Payer: Self-pay | Admitting: Family Medicine

## 2018-03-02 NOTE — Telephone Encounter (Signed)
Oral Oncology Patient Advocate Encounter  Zytiga approved 02/19/18-02/20/19.  Patient is aware and I have him the phone number to call so that he could get the shipment in process.  [place] Patient [place] Phone [phone] Fax [phone]

## 2018-03-02 NOTE — Telephone Encounter (Signed)
Copied from [place] [phone]. Topic: Quick Communication - See Telephone Encounter >> Mar 02, 2018  4:56 PM Rutherford Nail, NT wrote: CRM for notification. See Telephone encounter for: 03/02/18. Olivia Mackie with [facility] calling and states that she was in the patient's home today. States that patient had c/o increased confusion. Blood pressure was 200/90 in left arm and 192/94 in the right arm. Heart rate was 84. States that patient c/o of increased pain. States that the patient had a fall on Saturday (02/27/18) and is bruised on right flank. States that there was some confusion about medications.States that the patient states he will call about getting an appointment to go over the medications when he is able to find transportation. States that bisoprolol is what is on the nurse med list. Patient has lisinopril in the home, but was not taking that. Patient unaware that he was supposed to be on bisoprolol.   CB#: [phone]

## 2018-03-03 ENCOUNTER — Emergency Department (HOSPITAL_COMMUNITY): Payer: Medicare Other

## 2018-03-03 ENCOUNTER — Telehealth: Payer: Self-pay | Admitting: *Deleted

## 2018-03-03 ENCOUNTER — Other Ambulatory Visit: Payer: Self-pay

## 2018-03-03 ENCOUNTER — Emergency Department (HOSPITAL_COMMUNITY)
Admission: EM | Admit: 2018-03-03 | Discharge: 2018-03-03 | Disposition: A | Discharge status: Home / Self Care | Payer: Medicare Other | Attending: Emergency Medicine | Admitting: Emergency Medicine

## 2018-03-03 ENCOUNTER — Encounter: Payer: Self-pay | Admitting: Family Medicine

## 2018-03-03 DIAGNOSIS — E876 Hypokalemia: Secondary | ICD-10-CM | POA: Insufficient documentation

## 2018-03-03 DIAGNOSIS — R200 Anesthesia of skin: Secondary | ICD-10-CM | POA: Insufficient documentation

## 2018-03-03 DIAGNOSIS — J449 Chronic obstructive pulmonary disease, unspecified: Secondary | ICD-10-CM | POA: Diagnosis not present

## 2018-03-03 DIAGNOSIS — Z7982 Long term (current) use of aspirin: Secondary | ICD-10-CM | POA: Diagnosis not present

## 2018-03-03 DIAGNOSIS — R531 Weakness: Secondary | ICD-10-CM | POA: Diagnosis not present

## 2018-03-03 DIAGNOSIS — C7951 Secondary malignant neoplasm of bone: Secondary | ICD-10-CM | POA: Insufficient documentation

## 2018-03-03 DIAGNOSIS — C419 Malignant neoplasm of bone and articular cartilage, unspecified: Secondary | ICD-10-CM

## 2018-03-03 DIAGNOSIS — R0902 Hypoxemia: Secondary | ICD-10-CM | POA: Diagnosis not present

## 2018-03-03 DIAGNOSIS — R079 Chest pain, unspecified: Secondary | ICD-10-CM | POA: Diagnosis not present

## 2018-03-03 DIAGNOSIS — G629 Polyneuropathy, unspecified: Secondary | ICD-10-CM | POA: Diagnosis not present

## 2018-03-03 DIAGNOSIS — Z79899 Other long term (current) drug therapy: Secondary | ICD-10-CM | POA: Diagnosis not present

## 2018-03-03 DIAGNOSIS — I480 Paroxysmal atrial fibrillation: Secondary | ICD-10-CM | POA: Insufficient documentation

## 2018-03-03 DIAGNOSIS — R52 Pain, unspecified: Secondary | ICD-10-CM

## 2018-03-03 DIAGNOSIS — S299XXA Unspecified injury of thorax, initial encounter: Secondary | ICD-10-CM | POA: Diagnosis not present

## 2018-03-03 DIAGNOSIS — C61 Malignant neoplasm of prostate: Secondary | ICD-10-CM | POA: Diagnosis not present

## 2018-03-03 DIAGNOSIS — E785 Hyperlipidemia, unspecified: Secondary | ICD-10-CM | POA: Diagnosis not present

## 2018-03-03 DIAGNOSIS — I10 Essential (primary) hypertension: Secondary | ICD-10-CM | POA: Insufficient documentation

## 2018-03-03 DIAGNOSIS — I2510 Atherosclerotic heart disease of native coronary artery without angina pectoris: Secondary | ICD-10-CM | POA: Insufficient documentation

## 2018-03-03 DIAGNOSIS — Z87891 Personal history of nicotine dependence: Secondary | ICD-10-CM | POA: Diagnosis not present

## 2018-03-03 LAB — COMPREHENSIVE METABOLIC PANEL
ALBUMIN: 4.2 g/dL (ref 3.5–5.0)
ALT: 14 U/L (ref 0–44)
ANION GAP: 14 (ref 5–15)
AST: 26 U/L (ref 15–41)
Alkaline Phosphatase: 69 U/L (ref 38–126)
BILIRUBIN TOTAL: 1.1 mg/dL (ref 0.3–1.2)
BUN: 10 mg/dL (ref 8–23)
CALCIUM: 9.6 mg/dL (ref 8.9–10.3)
CO2: 40 mmol/L — ABNORMAL HIGH (ref 22–32)
Chloride: 86 mmol/L — ABNORMAL LOW (ref 98–111)
Creatinine, Ser: 0.67 mg/dL (ref 0.61–1.24)
GFR calc Af Amer: >60 mL/min (ref >60)
GLUCOSE: 131 mg/dL — AB (ref 70–99)
Potassium: 2.4 mmol/L — CL (ref 3.5–5.1)
Sodium: 140 mmol/L (ref 135–145)
Total Protein: 7.5 g/dL (ref 6.5–8.1)

## 2018-03-03 LAB — CBC WITH DIFFERENTIAL/PLATELET
Basophils Absolute: 0 10*3/uL (ref 0.0–0.1)
Basophils Relative: 0 %
Eosinophils Absolute: 0 10*3/uL (ref 0.0–0.7)
Eosinophils Relative: 0 %
HEMATOCRIT: 37.5 % — AB (ref 39.0–52.0)
HEMOGLOBIN: 12.5 g/dL — AB (ref 13.0–17.0)
Lymphocytes Relative: 11 %
Lymphs Abs: 0.8 10*3/uL (ref 0.7–4.0)
MCH: 28.5 pg (ref 26.0–34.0)
MCHC: 33.3 g/dL (ref 30.0–36.0)
MCV: 85.6 fL (ref 78.0–100.0)
MONO ABS: 0.1 10*3/uL (ref 0.1–1.0)
MONOS PCT: 2 %
NEUTROS PCT: 87 %
Neutro Abs: 5.9 10*3/uL (ref 1.7–7.7)
Platelets: 292 10*3/uL (ref 150–400)
RBC: 4.38 MIL/uL (ref 4.22–5.81)
RDW: 13.1 % (ref 11.5–15.5)
WBC: 6.9 10*3/uL (ref 4.0–10.5)

## 2018-03-03 LAB — URINALYSIS, ROUTINE W REFLEX MICROSCOPIC
BILIRUBIN URINE: NEGATIVE
Glucose, UA: NEGATIVE mg/dL
Hgb urine dipstick: NEGATIVE
Ketones, ur: NEGATIVE mg/dL
Leukocytes, UA: NEGATIVE
Nitrite: NEGATIVE
PH: 8 (ref 5.0–8.0)
Protein, ur: NEGATIVE mg/dL
SPECIFIC GRAVITY, URINE: 1.004 — AB (ref 1.005–1.030)

## 2018-03-03 MED ORDER — POTASSIUM CHLORIDE 10 MEQ/100ML IV SOLN: 10.0000 meq | INTRAVENOUS | Status: DC

## 2018-03-03 MED ORDER — POTASSIUM CHLORIDE ER 20 MEQ PO TBCR: 20.0000 meq | EXTENDED_RELEASE_TABLET | Freq: Two times a day (BID) | ORAL | 0 refills | Status: AC

## 2018-03-03 MED ORDER — POTASSIUM CHLORIDE CRYS ER 20 MEQ PO TBCR
60.0000 meq | EXTENDED_RELEASE_TABLET | Freq: Once | ORAL | Status: AC
  Administered 2018-03-03: 60 meq via ORAL
  Filled 2018-03-03: qty 3

## 2018-03-03 MED ORDER — POTASSIUM CHLORIDE CRYS ER 20 MEQ PO TBCR: 40.0000 meq | EXTENDED_RELEASE_TABLET | Freq: Once | ORAL | Status: DC

## 2018-03-03 MED ORDER — GABAPENTIN 300 MG PO CAPS: 300.0000 mg | ORAL_CAPSULE | Freq: Two times a day (BID) | ORAL | 0 refills | Status: AC

## 2018-03-03 NOTE — ED Triage Notes (Signed)
Per EMS: PT is coming from home. Pt is a bone cancer pt and followed by hospice and palliative care of [place]. Pt denies dizziness or LOC. Pt took diltiazem right before EMS brought him. Pt reports he gets a prescription for 180 pills of 10mg  Oxycodone and that lasts him for 15 days.  Pt is urinating frequently and in abundance as he has had his lasix today. Pt complains of right flank and tailbone pain from his fall.

## 2018-03-03 NOTE — ED Notes (Signed)
Pt wife will be in route to facility to pick patient up after 1700. Charge RN made aware. Pt will be going home with wife.

## 2018-03-03 NOTE — Telephone Encounter (Signed)
CRM for notification. See Telephone encounter for: 03/02/18. Olivia Mackie with [facility] calling and states that she was in the patient's home today. States that patient had c/o increased confusion. Blood pressure was 200/90 in left arm and 192/94 in the right arm. Heart rate was 84. States that patient c/o of increased pain. States that the patient had a fall on Saturday (02/27/18) and is bruised on right flank. States that there was some confusion about medications.States that the patient states he will call about getting an appointment to go over the medications when he is able to find transportation. States that bisoprolol is what is on the nurse med list. Patient has lisinopril in the home, but was not taking that. Patient unaware that he was supposed to be on bisoprolol.   CB#: [phone]

## 2018-03-03 NOTE — Telephone Encounter (Signed)
This message was copied and pasted to another phone note.

## 2018-03-03 NOTE — ED Provider Notes (Signed)
[facility] DEPT Provider Note   CSN: [id number] Arrival date & time: 03/03/18  1152     History   Chief Complaint Chief Complaint  Patient presents with  . Fall  . Flank Pain    HPI Scott Rollins is a 67 y.o. male presenting for evaluation of pain, weakness, and after a fall.  Patient presenting for evaluation of generalized weakness and increased falls over the past several weeks.  Patient with a history of bone cancer, which seems to be progressing.  On hospice and palliative care.  He has had to increase his pain medication significantly, taking 180 pills of oxycodone in 15 days.  Patient states he is still on his blood thinner.  His legs have been giving out from him without warning, more frequently recently.  He states he does not have the strength to get up out of the bed.  He called his doctor, who recommended he comes to the ER for evaluation of his spine.  Patient denies hitting his head during any fall.  No headache or vision changes.  No neck pain.  He reports pain of his bilateral chest wall, bilateral hips, right low back.  He reports bilateral foot numbness/tingling, which has been present for a while but worsening recently.  He has new right hand tingling.  Patient denies recent fevers, chills, chest pain, shortness of breath, nausea, vomiting, abdominal pain, or abnormal bowel movements.  He reports urinary incontinence, which has been present for several weeks to months.  Additional history obtained from chart review.  Patient with frequent phone calls to his primary care and oncologist, but no recent visits (5/22 last seen by PCP, 4/25 last seen by oncology. Dr Estill Cotta with palliative care/hopsice visits are not in the pt's chart, however pt still making phone calls to PCP and oncology).  There is been confusion about blood pressure control, is supposed to be on bisoprolol but instead is taking lisinopril.  Frequent phone calls for  further pain medication.  Chart review, patient discussed his symptoms with his oncologist, who recommends he comes to the emergency room for an MRI of his spine to rule out cord compression.    HPI  Past Medical History:  Diagnosis Date  . Anginal pain ([place])   . Anxiety   . Arthritis    "hips, knees, thoracic back" (01/22/2017)  . CAP (community acquired pneumonia) 11/2016  . 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 COPD (chronic obstructive pulmonary disease) ([place])    "pearl study" pt thinks he is on Symbicort; pt noncompliant with COPD controller meds on/off due to financial reasons.  . Coronary atherosclerosis of unspecified type of vessel, native or graft 2005   MI, s/p PCI with stent (pt reports 20+ interventions in the past, most recent cat 6/08 showed patent stents).  Normal LV function.  Nuclear stress test NEG 8/09.  . Depression    ?bipolar dx by psychiatrist?  . Gouty arthritis    Dr. Amil Amen  . Heart murmur   . Hematochezia 09/2012   Hyperplastic polyp, diverticulosis, and internal hemorrhoids found on colonoscopy 10/2012  . History of hiatal hernia   . Hyperkalemia 01/29/2017   Kayexalate 30 given x 1.  . Hyperlipidemia    Hx of multiple statin intolerance  . Hypertension   . 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 Migraine    "none in the 2000s" (01/15/2017)  . Mitral valve prolapse ~ 1981  . Neuropathic pain of both feet 2015/2016   Vit B12 and A1c normal 10/2014  . Obesities, morbid ([place])   . On home oxygen therapy    "2L prn" (01/15/2017)  . OSA on CPAP    w/oxygen (01/15/2017).  Noncompliance 2019.  Dr. Halford Chessman to repeat split night sleep study as of 12/2017 (not done as of 03/03/18)  . Osteoarthrosis, unspecified whether generalized or localized, unspecified site    DDD  . PAF (paroxysmal atrial fibrillation) ([place]) 12/2016    Started in the context of resp illness/lots of bronchodilators.  Started eliquis and cardizem during hospitalization 01/2017 (Dr. Rayann Heman).  . Pre-diabetes 2016/17  . Prostate cancer metastatic to multiple sites [facility]) 09/2010; 2018   2012: Localized, high risk disease (Dr. Kimbrough/Grapey):  s/p 5 wks ext bm rad + seed boost, as well as hormone blockade x 1 yr.  Biochemical recurrence 11/2015;  2018 imaging showed bone mets and lymph node involvement.  Palliative Lupron and zytiga 2018/19--good PSA response as of 09/2017 onc f/u.  Marland Kitchen Radiation    Pelvic--for prostate ca: 25 treatments/01/2011  . Seizure disorder ([place]) 08-15-2011   Deja vu sensations: no w/u.  These stopped when neurontin 300mg  tid was started for a different reason.  . Status post chemotherapy    10/2010 thru 06/2011  . Tobacco dependence    still smoking as of fall 2018.    Patient Active Problem List   Diagnosis Date Noted  . Palliative care by specialist   . Acute pulmonary edema ([place])   . Acute on chronic respiratory failure with hypoxia and hypercapnia ([place]) 12/21/2017  . Acute exacerbation of CHF (congestive heart failure) ([place]) 12/21/2017  . Port-A-Cath in place 12/16/2017  . COPD exacerbation ([place]) 11/16/2017  . Acute hypercapnic respiratory failure ([place]) 11/16/2017  . Prostate cancer metastatic to bone ([place])   . Abnormal CT of the chest 01/21/2017  . Obesity hypoventilation syndrome ([place])   . Atrial fibrillation ([place]) 01/15/2017  . Community acquired pneumonia of right lower lobe of lung ([place]) 11/05/2016  . Hyponatremia 11/05/2016  . OSA (obstructive sleep apnea) 11/05/2016  . 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 COPD (chronic obstructive pulmonary disease) ([place]) 04/19/2014  . Chronic pain syndrome 01/11/2014  . Chronic gout 04/22/2013  . Goals of care, counseling/discussion 04/29/2012  . Mixed hyperlipidemia 08/27/2010  . ELEVATED PROSTATE SPECIFIC ANTIGEN 08/26/2010  . 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  HEMATURIA, HX OF 08/21/2010  . TOBACCO ABUSE 07/11/2010  . NUMMULAR ECZEMA 06/24/2010  . DYSPNEA 04/10/2009  . OBESITY 04/09/2009  . Essential hypertension 04/09/2009  . Coronary artery disease due to lipid rich plaque 04/09/2009  . DEGENERATIVE JOINT DISEASE 04/09/2009    Past Surgical History:  Procedure Laterality Date  . CARDIAC CATHETERIZATION  23 caths  . COLONOSCOPY WITH PROPOFOL N/A 04/05/2013   Dr. Fuller Plan.  Polypectomy (hyperplastic--recall 10 yrs).  Moderate diverticulosis, +internal hemorrhoids.  No radiation proctitis.  . CORONARY ANGIOPLASTY    . CORONARY ANGIOPLASTY WITH STENT PLACEMENT     "5-6 stents" (01/15/2017)  . HERNIA REPAIR    . HOT HEMOSTASIS N/A 04/05/2013   Procedure: HOT HEMOSTASIS (ARGON PLASMA COAGULATION/BICAP);  Surgeon: Ladene Artist, MD;  Location: [facility];  Service: Endoscopy;  Laterality: N/A;  . INSERTION PROSTATE RADIATION SEED  02/24/2011  . TRANSTHORACIC ECHOCARDIOGRAM  12/2016   2018: EF 55-60%, moderate LVH, grd  I DD, mild LA dilation.  11/17/17: LVEF of 55 to 67%, grade 1 diastolic dysfunction no wall motion abnormalities.  Marland Kitchen UMBILICAL HERNIA REPAIR          Home Medications    Prior to Admission medications   Medication Sig Start Date End Date Taking? Authorizing Provider  abiraterone acetate (ZYTIGA) 250 MG tablet Take 4 tablets (1,000 mg total) by mouth daily. Take on an empty stomach 1 hour before or 2 hours after a meal 01/27/18  Yes Shadad, Mathis Dad, MD  albuterol (PROAIR HFA) 108 (90 Base) MCG/ACT inhaler Inhale 2 puffs into the lungs every 6 (six) hours as needed for wheezing or shortness of breath. 11/04/17  Yes [name], [name], MD  allopurinol (ZYLOPRIM) 300 MG tablet Take 1 tablet (300 mg total) by mouth daily. 01/01/18  Yes [name], [name], MD  apixaban (ELIQUIS) 5 MG TABS tablet Take 1 tablet (5 mg total) by mouth 2 (two) times daily. 09/28/17  Yes [name], [name], MD  aspirin EC 81 MG EC tablet Take 1 tablet (81 mg total)  by mouth daily. 12/27/17  Yes Allie Bossier, MD  bisoprolol (ZEBETA) 10 MG tablet Take 1 tablet (10 mg total) by mouth daily. 01/07/18  Yes [name], [name], MD  citalopram (CELEXA) 40 MG tablet Take 1 tablet (40 mg total) by mouth at bedtime. 11/19/17  Yes Aline August, MD  colchicine 0.6 MG tablet Take 1 tablet (0.6 mg total) by mouth 2 (two) times daily as needed (gout flare ups). 01/04/18  Yes [name], [name], MD  diltiazem (CARDIZEM) 30 MG tablet Take 30 mg by mouth every 12 (twelve) hours. 02/24/18  Yes [provider]  feeding supplement, ENSURE ENLIVE, (ENSURE ENLIVE) LIQD Take 237 mLs by mouth 2 (two) times daily between meals. Patient taking differently: Take 237 mLs by mouth See admin instructions. 237 mls --Three to four times per day. 01/17/17  Yes Hosie Poisson, MD  Fluticasone-Umeclidin-Vilant (TRELEGY ELLIPTA) 100-62.5-25 MCG/INH AEPB Inhale 1 puff into the lungs daily. 12/29/17  Yes Chesley Mires, MD  furosemide (LASIX) 40 MG tablet Take 1 tablet (40 mg total) by mouth 2 (two) times daily. 12/26/17  Yes Allie Bossier, MD  lisinopril (PRINIVIL,ZESTRIL) 10 MG tablet Take 10 mg by mouth daily. 01/01/18  Yes [provider]  Oxycodone HCl 10 MG TABS Take 1 tablet (10 mg total) by mouth every 8 (eight) hours as needed. Patient taking differently: Take 10-20 mg by mouth every 4 (four) hours as needed (pain).  01/07/18  Yes Wyatt Portela, MD  pantoprazole (PROTONIX) 40 MG tablet TAKE 1 TABLET BY MOUTH EVERY DAY 12/28/17  Yes [name], [name], MD  potassium chloride SA (K-DUR,KLOR-CON) 20 MEQ tablet 1 tab po qd EVERY DAY 01/05/18  Yes [name], [name], MD  predniSONE (DELTASONE) 20 MG tablet Take 1 tablet (20 mg total) by mouth daily with supper. 12/26/17  Yes Allie Bossier, MD  Probiotic Product (PROBIOTIC PO) Take 1 capsule by mouth daily.   Yes [provider]  prochlorperazine (COMPAZINE) 10 MG tablet Take 1 tablet (10 mg total) by mouth every 6 (six) hours as  needed for nausea or vomiting. 11/13/17  Yes Wyatt Portela, MD  tamsulosin (FLOMAX) 0.4 MG CAPS capsule Take 1 capsule (0.4 mg total) by mouth daily after supper. Patient taking differently: Take 0.4 mg by mouth as needed (urination).  11/13/17  Yes [name], [name], MD  traMADol (ULTRAM) 50 MG tablet TAKE 1  TABLET BY MOUTH EVERY 6HRS AS NEEDED Patient taking differently: Take 50 mg by mouth every 6 (six) hours as needed for moderate pain.  02/19/18  Yes Wyatt Portela, MD  albuterol (PROVENTIL) (2.5 MG/3ML) 0.083% nebulizer solution Take 3 mLs (2.5 mg total) by nebulization every 4 (four) hours as needed for wheezing or shortness of breath. Patient not taking: Reported on 03/03/2018 12/02/17   [name], MD  budesonide-formoterol [facility]) 160-4.5 MCG/ACT inhaler Inhale 2 puffs into the lungs 2 (two) times daily. Patient not taking: Reported on 03/03/2018 01/28/17   [name], MD  citalopram (CELEXA) 40 MG tablet Take 1 tablet (40 mg total) by mouth daily. Patient not taking: Reported on 03/03/2018 02/18/18   [name], MD  diltiazem (CARDIZEM) 60 MG tablet Take 1 tablet (60 mg total) by mouth every 12 (twelve) hours. Patient not taking: Reported on 03/03/2018 12/26/17   Allie Bossier, MD  doxycycline (VIBRA-TABS) 100 MG tablet Take 1 tablet (100 mg total) by mouth every 12 (twelve) hours. Patient not taking: Reported on 03/03/2018 12/26/17   Allie Bossier, MD  gabapentin (NEURONTIN) 300 MG capsule Take 1 capsule (300 mg total) by mouth 2 (two) times daily. 03/03/18   Emil Weigold, PA-C  nicotine (NICODERM CQ - DOSED IN MG/24 HOURS) 14 mg/24hr patch Place 1 patch (14 mg total) onto the skin daily. Patient not taking: Reported on 01/04/2018 12/27/17   Allie Bossier, MD  nystatin cream (MYCOSTATIN) Apply 1 application topically 2 (two) times daily. Patient not taking: Reported on 03/03/2018 01/11/18   [name], MD  Oxycodone HCl 10 MG TABS Take 1-2 tablets (10-20 mg  total) by mouth every 4 (four) hours as needed. Patient not taking: Reported on 03/03/2018 03/01/18   Wyatt Portela, MD  potassium chloride 20 MEQ TBCR Take 20 mEq by mouth 2 (two) times daily for 4 days. 03/03/18 03/07/18  Sephora Boyar, PA-C  urea (CARMOL) 40 % CREA Apply to right lower leg once daily Patient not taking: Reported on 03/03/2018 12/02/17   [name], MD    Family History Family History  Problem Relation Age of Onset  . Arthritis Mother   . Heart disease Father   . Bipolar disorder Father     Social History Social History   Tobacco Use  . Smoking status: Former Smoker    Packs/day: 1.50    Years: 55.00    Pack years: 82.50    Types: Cigarettes  . Smokeless tobacco: Never Used  . Tobacco comment: 2 weeks ago  Substance Use Topics  . Alcohol use: Not Currently    Comment: h/o heavy use - at least 6 daily, last use years ago  . Drug use: Yes    Types: Marijuana    Comment: 2-3 weeks ago last use     Allergies   Amitriptyline hcl and Ezetimibe   Review of Systems Review of Systems  Musculoskeletal: Positive for arthralgias, back pain and myalgias.  Neurological: Positive for weakness and numbness.  Hematological: Bruises/bleeds easily.  All other systems reviewed and are negative.    Physical Exam Updated Vital Signs BP (!) 198/107   Pulse 84   Temp 97.6 F (36.4 C) (Oral)   Resp 19   SpO2 93%   Physical Exam  Constitutional: He is oriented to person, place, and time. He appears well-developed and well-nourished. No distress.  Chronically ill-appearing male in no acute distress  HENT:  Head: Normocephalic and atraumatic.  No obvious head injury  Eyes: Pupils are equal, round, and reactive to light. Conjunctivae and EOM are normal.  EOMI and PERRLA.  No nystagmus.  Neck: Normal range of motion. Neck supple.  No tenderness palpation of midline C-spine.  Cardiovascular: Normal rate, regular rhythm and intact distal pulses.    Pulmonary/Chest: Effort normal and breath sounds normal. No respiratory distress. He has no wheezes.  On oxygen.  Speaking in short sentences.  Decreased sounds in all fields, although this may be due to body habitus.  Abdominal: Soft. He exhibits no distension and no mass. There is no tenderness. There is no guarding.  Musculoskeletal: He exhibits tenderness.  Significant bruising noted of the right hip and right low back/flank.  No focal tenderness.  Generalized tenderness palpation of the left hip.  Tenderness palpation of bilateral low back and midline spine. Strength of upper extremities intact bilaterally.  4 out of 5 strength of lower extremities bilaterally.  Pedal pulses intact.  Soft compartments.  Chronic skin changes noted on the right lower extremity.  No lower leg edema.   Neurological: He is alert and oriented to person, place, and time. No cranial nerve deficit or sensory deficit.  No obvious neurologic deficits.  CN intact.  Nose to finger intact.  Fine movement and coordination intact.  Sensation intact bilaterally.  Skin: Skin is warm and dry. Capillary refill takes less than 2 seconds.  Multiple contusions.  Psychiatric: He has a normal mood and affect.  Nursing note and vitals reviewed.    ED Treatments / Results  Labs (all labs ordered are listed, but only abnormal results are displayed) Labs Reviewed  CBC WITH DIFFERENTIAL/PLATELET - Abnormal; Notable for the following components:      Result Value   Hemoglobin 12.5 (*)    HCT 37.5 (*)    All other components within normal limits  COMPREHENSIVE METABOLIC PANEL - Abnormal; Notable for the following components:   Potassium 2.4 (*)    Chloride 86 (*)    CO2 40 (*)    Glucose, Bld 131 (*)    All other components within normal limits  URINALYSIS, ROUTINE W REFLEX MICROSCOPIC - Abnormal; Notable for the following components:   Color, Urine COLORLESS (*)    Specific Gravity, Urine 1.004 (*)    All other components  within normal limits  URINE CULTURE    EKG EKG Interpretation  Date/Time:  Wednesday March 03 2018 12:20:45 EDT Ventricular Rate:  72 PR Interval:    QRS Duration: 108 QT Interval:  462 QTC Calculation: 506 R Axis:   65 Text Interpretation:  Sinus rhythm Prolonged QT interval No STEMI.  Lateral ST changes similar to tracing from June 2019.  Confirmed by Nanda Quinton [phone]) on 03/03/2018 1:25:27 PM   Radiology Dg Chest 2 View  Result Date: 03/03/2018 CLINICAL DATA:  Pain following recent fall EXAM: CHEST - 2 VIEW COMPARISON:  December 21, 2017 FINDINGS: There is no edema or consolidation. Heart is upper normal in size with pulmonary vascularity normal. No adenopathy. There is mild degenerative change in the thoracic spine. No pneumothorax. IMPRESSION: No edema or consolidation.  Heart upper normal in size. Electronically Signed   By: Lowella Grip III M.D.   On: 03/03/2018 13:50   Mr Lumbar Spine Wo Contrast  Result Date: 03/03/2018 CLINICAL DATA:  Metastatic prostate cancer.  Worsening pain. EXAM: MRI LUMBAR SPINE WITHOUT CONTRAST TECHNIQUE: Multiplanar, multisequence MR imaging of the lumbar spine was performed. No intravenous contrast was administered. COMPARISON:  CT and bone scan 12/18/2017 FINDINGS: Segmentation:  5 lumbar type vertebral bodies. Alignment:  Normal except for 2 mm of anterolisthesis L4-5. Vertebrae: No fracture. No extraosseous tumor. Multiple metastases, affecting each of the lumbar vertebral bodies, extensively affecting the sacrum left more than right and extensively affecting the iliac bones left more than right. Conus medullaris and cauda equina: Conus extends to the L1 level. Conus and cauda equina appear normal. Paraspinal and other soft tissues: Negative Disc levels: Mild non-compressive disc bulges at L3-4 and above. At L4-5, there is bilateral facet arthropathy with 2 mm of anterolisthesis. There is mild bulging of the disc. There is mild stenosis of the  lateral recesses and foramina without definite neural compression. L5-S1 shows mild bulging of the disc and mild facet arthritis but no stenosis. IMPRESSION: Bony metastatic disease throughout the region. There is involvement of each lumbar vertebral body, of the sacrum left more than right and of the iliac bones left more than right. However, there does not appear to be a pathologic fracture, any extraosseous extent of tumor or pronounced edematous change. The findings could certainly be associated with back pain. Ordinary age related degenerative changes in the lower lumbar spine. L4-5 shows facet arthropathy with 2 mm of anterolisthesis and mild bulging of the disc. Mild narrowing of the lateral recesses and foramina without definite neural compression. Electronically Signed   By: Nelson Chimes M.D.   On: 03/03/2018 15:16   Dg Hip Unilat With Pelvis 2-3 Views Right  Result Date: 03/03/2018 CLINICAL DATA:  Pain following recent falls EXAM: DG HIP (WITH OR WITHOUT PELVIS) 2-3V RIGHT COMPARISON:  CT abdomen and pelvis with bony reformats December 18, 2017 FINDINGS: Frontal pelvis as well as frontal and lateral right hip images were obtained. There is no acute fracture or dislocation. There is extensive sclerosis throughout portions of each iliac bone, both sacral ala, lower lumbar spine, as well as scattered in the right proximal femur, both acetabula, and both ischia consistent with widespread sclerotic bony metastatic disease. There is mild symmetric narrowing of both hip joints. There are seed implants in the prostate. IMPRESSION: Widespread blastic bony metastatic disease consistent with prostate carcinoma. Seed implants noted in the prostate. Mild symmetric narrowing of both hip joints. No acute fracture or dislocation evident. Electronically Signed   By: Lowella Grip III M.D.   On: 03/03/2018 13:52    Procedures Procedures (including critical care time)  Medications Ordered in ED Medications    potassium chloride SA (K-DUR,KLOR-CON) CR tablet 60 mEq (has no administration in time range)     Initial Impression / Assessment and Plan / ED Course  I have reviewed the triage vital signs and the nursing notes.  Pertinent labs & imaging results that were available during my care of the patient were reviewed by me and considered in my medical decision making (see chart for details).     Patient presenting for evaluation of generalized weakness and frequent falls.  Initial exam shows patient who is in poor health chronically.  No obvious head injury or neurologic deficits.  Multiple contusions noted over patient's hips, back, and shoulders.  Neurologic exam appears symmetric, no focal deficits.  Doubt CVA or intracranial bleed.  Per history, patient's bilateral lower leg numbness appears more consistent with neuropathy.  However, patient's oncologist concerned about possible cord compression and requesting MRI.  Will obtain basic labs, chest x-ray, pelvic x-ray, and MRI of the spine.  Case discussed with attending, Dr. Wilson Singer evaluated patient.  Patient's potassium critically low at 2.4.  Replenished orally.  X-ray showed metastatic disease of the pelvis without acute fracture or dislocation.  Chest x-ray viewed interpreted by me, no pneumonia, pneumothorax, effusions, fractures, dislocation.  Otherwise, patient's labs appear at baseline.  Bicarb elevated calcium low, consistent with previous labs.  MRI pending.  MRI shows bone cancer lesions in all lumbar vertebrae without acute fracture or dislocation.  No obvious spinal cord compression.  Will have patient follow-up with his PCP for further management of his medications.  I am concerned about the patient's frequent falls, and the fact that he is still on blood thinner.  Recommend patient discuss this with his primary care doctor.  Will increase his gabapentin to twice a day for presumed neuropathy.  Patient has medication for pain as needed.   Will give oral potassium for the next several days for replenishment, have PCP follow-up for recheck.  At this time, patient appears safe for discharge.  Return precautions given.  Patient states he understands and agrees plan.  Final Clinical Impressions(s) / ED Diagnoses   Final diagnoses:  Hypokalemia  Neuropathy  Malignant neoplasm of bone, unspecified location [facility])    ED Discharge Orders         Ordered    gabapentin (NEURONTIN) 300 MG capsule  2 times daily     03/03/18 1540    potassium chloride 20 MEQ TBCR  2 times daily     03/03/18 1541           Abbegayle Denault, PA-C 03/03/18 1555    Virgel Manifold, MD 03/04/18 1156

## 2018-03-03 NOTE — Telephone Encounter (Signed)
Called pt to see if he could schedule an apt to review his medication. Pt stated that he did not have a way to get here and he is in a lot of pain. I advise pt that he should contact his oncologist to discuss his pain control. He stated that he is trying to get in touch with them. He stated that he does not want to schedule an apt w/ Dr. Anitra Lauth at this time. He wants to work on getting his pain under controll first.

## 2018-03-03 NOTE — ED Notes (Signed)
RN has contacted MRI and made MRI personnel aware that patient is ready for MRI.

## 2018-03-03 NOTE — ED Notes (Signed)
Patient transported to MRI 

## 2018-03-03 NOTE — ED Notes (Signed)
Date and time results received: 03/03/18 1445 (use smartphrase ".now" to insert current time)  Test: K+ Critical Value: 2.4  Name of Provider Notified: P.A. Sophia  Orders Received? Or Actions Taken?:

## 2018-03-03 NOTE — ED Notes (Signed)
Bed: JP21 Expected date:  Expected time:  Means of arrival:  Comments: EMS/fall/palliative care pt.

## 2018-03-03 NOTE — Telephone Encounter (Signed)
Per previous phone note, and Dr. Alen Blew, patient needs to go to the ER for evaluation of falling, escalation in his pain, weakness and incontinence of bladder. Patient stated,"I have fallen five or six times already. It feels like I've got neuropathy in my hands now." Dr. Alen Blew wants you to have an MRI of thoracic and lumbar to rule out cord compression. Instructed patient to call 911 since, he is by himself. Instructed him not to drive. Patient is OK calling 911. I called Dr. Elenor Quinones with Palliative Care/ Hospice and informed her what this office is doing. I told her I would call the patient and give him instructions. She verbalized understanding. I will call the [facility] ER and give them a heads up.

## 2018-03-03 NOTE — Telephone Encounter (Signed)
TC from Dr. Elenor Quinones with palliative care/[facility]. She states that a palliative care nurse saw pt yesterday. Nurse reports that pt is having an escalation in his pain-specifically his lower back, c/o legs giving out and incontinence of bladder at times. Pt had a fall on 02/27/18 with bruising to right flank. Pt is using pull-ups now, having trouble getting to the bathroom d/t leg weakness. Dr. Estill Cotta also states she had noted an increase in use of pain meds- 20 mg every 2 hours as well as tramadol, without significant relief of pain.  Pt is not scheduled to see Dr. Alen Blew until 03/11/18.  Dr. Estill Cotta is asking if pt can be seen sooner and have imaging studies done to r/o cord compression concerns  Dr. Estill Cotta would appreciate a call back @ [phone] regarding plan for this patient as she is very concerned about this escalation in pain, leg weakness.

## 2018-03-03 NOTE — Discharge Instructions (Signed)
Start taking gabapentin twice a day for your numbness and tingling. Take potassium as prescribed. It is very important that you follow-up with your primary care doctor for further evaluation of your medical conditions and medications. You should discuss your need for the medications you are on, especially your blood thinner considering your recent frequent falls. Return to the emergency room with any new, worsening, or concerning symptoms.

## 2018-03-03 NOTE — ED Notes (Signed)
Patient transported to X-ray 

## 2018-03-04 ENCOUNTER — Ambulatory Visit: Payer: Medicare Other | Admitting: Oncology

## 2018-03-04 ENCOUNTER — Telehealth: Payer: Self-pay | Admitting: Oncology

## 2018-03-04 ENCOUNTER — Telehealth: Payer: Self-pay | Admitting: *Deleted

## 2018-03-04 ENCOUNTER — Telehealth: Payer: Self-pay | Admitting: Family Medicine

## 2018-03-04 DIAGNOSIS — C7951 Secondary malignant neoplasm of bone: Secondary | ICD-10-CM | POA: Diagnosis not present

## 2018-03-04 DIAGNOSIS — K5900 Constipation, unspecified: Secondary | ICD-10-CM | POA: Diagnosis not present

## 2018-03-04 DIAGNOSIS — J449 Chronic obstructive pulmonary disease, unspecified: Secondary | ICD-10-CM | POA: Diagnosis not present

## 2018-03-04 DIAGNOSIS — G893 Neoplasm related pain (acute) (chronic): Secondary | ICD-10-CM | POA: Diagnosis not present

## 2018-03-04 DIAGNOSIS — I4891 Unspecified atrial fibrillation: Secondary | ICD-10-CM | POA: Diagnosis not present

## 2018-03-04 DIAGNOSIS — C61 Malignant neoplasm of prostate: Secondary | ICD-10-CM | POA: Diagnosis not present

## 2018-03-04 DIAGNOSIS — Z515 Encounter for palliative care: Secondary | ICD-10-CM | POA: Diagnosis not present

## 2018-03-04 LAB — URINE CULTURE: Culture: <10000 — AB

## 2018-03-04 NOTE — Telephone Encounter (Signed)
Received voice mail message from patient stating,"someone wanted me to come in today at 12 but I can't make it. Can we arrange another day when I have transportation? Return number is [phone].

## 2018-03-04 NOTE — Telephone Encounter (Signed)
He has appointment on 8/29. He will keep that appointment and call us if there is a problem in the interim.

## 2018-03-04 NOTE — Telephone Encounter (Signed)
LVM for pt regarding upcoming aug appts per 8/22 sch message.

## 2018-03-04 NOTE — Telephone Encounter (Signed)
Attempted to contact patient for TCM - due to recent hospital admission.  Patient needs to follow up w/ DR [name] to discuss chronic medications and discuss blood thinners.

## 2018-03-05 ENCOUNTER — Other Ambulatory Visit: Payer: Self-pay

## 2018-03-05 NOTE — Patient Outreach (Signed)
[facility]) Care Management  03/05/2018  AMOR PACKARD 1951-01-22 [id number]   Recent admission to emergency room. RNCM called to follow up. RNCM confirmed that client is active with care connection program. No answer. HIPPA compliant message left.  Plan: follow up next week telephonically.  [name], RN, MSN, [place] Coordinator Cell: [phone]

## 2018-03-05 NOTE — Patient Outreach (Signed)
[facility]) Care Management  03/05/2018  Scott WEISENSEL 1950-09-11 [id number]   RNCM called [facility]-palliative care. Spoke with Mechele Claude, who confirmed that patient has signed up with the care connections program. RNCM informed her that client had recent visit to the emergency room and that client is now home.   Plan: follow up with client.  [name], RN, MSN, [place] Coordinator Cell: [phone]

## 2018-03-05 NOTE — Telephone Encounter (Signed)
LMOM for pt to CB to discuss hospital discharge.

## 2018-03-08 ENCOUNTER — Telehealth: Payer: Self-pay | Admitting: Oncology

## 2018-03-08 ENCOUNTER — Telehealth: Payer: Self-pay | Admitting: *Deleted

## 2018-03-08 NOTE — Telephone Encounter (Signed)
Tried to reach regarding voicemail. I did leave a mesage

## 2018-03-08 NOTE — Telephone Encounter (Signed)
Left message for patient to call me

## 2018-03-09 ENCOUNTER — Other Ambulatory Visit: Payer: Self-pay

## 2018-03-09 ENCOUNTER — Other Ambulatory Visit: Payer: Medicare Other

## 2018-03-09 NOTE — Patient Outreach (Signed)
[facility]) Care Management  03/09/2018  Scott Rollins [birth year] [id number]   Subjective: "I am going with Hospice now. I really think I will do better with Hospice. It will help me feel more comfortable."  Objective: none  Assessment: RNCM called to follow up. Client reports with a positive attitude that he has signed up with the Hospice program. Support provided.  Plan: close case.  [name], RN, MSN, [place] Coordinator Cell: [phone]

## 2018-03-11 ENCOUNTER — Telehealth: Payer: Self-pay

## 2018-03-11 ENCOUNTER — Inpatient Hospital Stay: Payer: Medicare Other

## 2018-03-11 ENCOUNTER — Inpatient Hospital Stay: Payer: Medicare Other | Attending: Oncology | Admitting: Oncology

## 2018-03-11 VITALS — BP 158/80 | HR 61 | Temp 98.1°F | Resp 17 | Ht 70.5 in | Wt 231.3 lb

## 2018-03-11 DIAGNOSIS — C61 Malignant neoplasm of prostate: Secondary | ICD-10-CM | POA: Diagnosis not present

## 2018-03-11 DIAGNOSIS — C7951 Secondary malignant neoplasm of bone: Secondary | ICD-10-CM | POA: Diagnosis not present

## 2018-03-11 DIAGNOSIS — R630 Anorexia: Secondary | ICD-10-CM | POA: Insufficient documentation

## 2018-03-11 DIAGNOSIS — R296 Repeated falls: Secondary | ICD-10-CM | POA: Diagnosis not present

## 2018-03-11 DIAGNOSIS — R634 Abnormal weight loss: Secondary | ICD-10-CM | POA: Diagnosis not present

## 2018-03-11 DIAGNOSIS — G4700 Insomnia, unspecified: Secondary | ICD-10-CM | POA: Insufficient documentation

## 2018-03-11 DIAGNOSIS — R52 Pain, unspecified: Secondary | ICD-10-CM | POA: Insufficient documentation

## 2018-03-11 DIAGNOSIS — F419 Anxiety disorder, unspecified: Secondary | ICD-10-CM | POA: Diagnosis not present

## 2018-03-11 LAB — CMP (CANCER CENTER ONLY)
ALK PHOS: 76 U/L (ref 38–126)
ALT: 12 U/L (ref 0–44)
AST: 14 U/L — ABNORMAL LOW (ref 15–41)
Albumin: 3.8 g/dL (ref 3.5–5.0)
Anion gap: 9 (ref 5–15)
BUN: 29 mg/dL — AB (ref 8–23)
CALCIUM: 9.2 mg/dL (ref 8.9–10.3)
CO2: 29 mmol/L (ref 22–32)
CREATININE: 0.96 mg/dL (ref 0.61–1.24)
Chloride: 99 mmol/L (ref 98–111)
GFR, Estimated: >60 mL/min (ref >60)
GLUCOSE: 99 mg/dL (ref 70–99)
Potassium: 4.8 mmol/L (ref 3.5–5.1)
SODIUM: 137 mmol/L (ref 135–145)
Total Bilirubin: 0.4 mg/dL (ref 0.3–1.2)
Total Protein: 6.6 g/dL (ref 6.5–8.1)

## 2018-03-11 LAB — CBC WITH DIFFERENTIAL (CANCER CENTER ONLY)
BASOS PCT: 0 %
Basophils Absolute: 0 10*3/uL (ref 0.0–0.1)
EOS ABS: 0 10*3/uL (ref 0.0–0.5)
EOS PCT: 0 %
HCT: 42 % (ref 38.4–49.9)
Hemoglobin: 13.7 g/dL (ref 13.0–17.1)
Lymphocytes Relative: 8 %
Lymphs Abs: 1.3 10*3/uL (ref 0.9–3.3)
MCH: 28.3 pg (ref 27.2–33.4)
MCHC: 32.7 g/dL (ref 32.0–36.0)
MCV: 86.4 fL (ref 79.3–98.0)
MONO ABS: 0.9 10*3/uL (ref 0.1–0.9)
MONOS PCT: 6 %
NEUTROS PCT: 86 %
Neutro Abs: 14.5 10*3/uL — ABNORMAL HIGH (ref 1.5–6.5)
PLATELETS: 453 10*3/uL — AB (ref 140–400)
RBC: 4.86 MIL/uL (ref 4.20–5.82)
RDW: 14.3 % (ref 11.0–14.6)
WBC Count: 16.8 10*3/uL — ABNORMAL HIGH (ref 4.0–10.3)

## 2018-03-11 MED ORDER — DENOSUMAB 120 MG/1.7ML ~~LOC~~ SOLN
SUBCUTANEOUS | Status: AC
  Filled 2018-03-11: qty 1.7

## 2018-03-11 MED ORDER — LORAZEPAM 1 MG PO TABS: 1.0000 mg | ORAL_TABLET | Freq: Three times a day (TID) | ORAL | 0 refills | Status: AC

## 2018-03-11 MED ORDER — ZOLPIDEM TARTRATE 10 MG PO TABS: 10.0000 mg | ORAL_TABLET | Freq: Every evening | ORAL | 0 refills | Status: DC | PRN

## 2018-03-11 NOTE — Telephone Encounter (Signed)
Printed avs and calender of upcoming appointment. Per 8/29 los 

## 2018-03-11 NOTE — Progress Notes (Signed)
Hematology and Oncology Follow Up Visit  [name] [id number] 1950-10-19 67 y.o. 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  He was diagnosed in 2012 with Gleason score 4+4 = 8 and PSA of 4.91.    Prior Therapy:  He is status post external beam radiation after 4 months of androgen deprivation completed in 2012 as a definitive treatment. He developed advanced disease in July in 2018.   Current therapy:  Lupron started in July 2018 initially at 7.5 mg monthly and will be switched to 30 mg every 4 months starting August 2018.  Zytiga and 1000 mg daily and 5 mg of prednisone. Started in August 2018.  Interim History:  Mr. Scott Rollins is here for a follow-up visit.  Since the last visit, he was seen in the emergency department on March 03, 2018 for worsening back pain.  MRI of his spine showed diffuse bone metastasis but no evidence of cord compression or pathological fractures.  He continues to have issues with back pain however and uses oxycodone 6 times a day.  He also been prescribed steroids which has helped his symptoms.  His quality of life continues to decline and has limited mobility with his walker.  He had multiple falls in the last few months.  His appetite has been poor and continues to lose weight.   He does not report any headaches or blurry vision, syncope or seizures. He does not report any fevers or chills or sweats. He does not report any  wheezing or hemoptysis. He does not report any chest pain, palpitation with mild leg edema. He does not report any nausea, vomiting or abdominal pain.  He denies any change in his bowel habits.  He does not report any frequency urgency or hesitancy.  He denies any skin rashes or lesions.  He denies any lymphadenopathy, petechiae or easy bruising.   He denies  any mood changes.  He denies any heat or cold intolerance.  Remaining review of systems is negative.   Medications: I have reviewed the patient's current medications.  Current Outpatient Medications  Medication Sig Dispense Refill  . abiraterone acetate (ZYTIGA) 250 MG tablet Take 4 tablets (1,000 mg total) by mouth daily. Take on an empty stomach 1 hour before or 2 hours after a meal 120 tablet 0  . albuterol (PROAIR HFA) 108 (90 Base) MCG/ACT inhaler Inhale 2 puffs into the lungs every 6 (six) hours as needed for wheezing or shortness of breath. 18 g 3  . albuterol (PROVENTIL) (2.5 MG/3ML) 0.083% nebulizer solution Take 3 mLs (2.5 mg total) by nebulization every 4 (four) hours as needed for wheezing or shortness of breath. (Patient not taking: Reported on 03/03/2018) 225 mL 3  . allopurinol (ZYLOPRIM) 300 MG tablet Take 1 tablet (300 mg total) by mouth daily. 90 tablet 3  . apixaban (ELIQUIS) 5 MG TABS tablet Take 1 tablet (5 mg total) by mouth 2 (two) times daily. 180 tablet 1  . aspirin EC 81 MG EC tablet Take 1 tablet (81 mg total) by mouth daily. 30 tablet 0  . bisoprolol (ZEBETA) 10 MG tablet Take 1 tablet (10 mg total) by mouth daily. 30 tablet 0  . budesonide-formoterol (SYMBICORT) 160-4.5 MCG/ACT inhaler Inhale 2 puffs into the lungs 2 (two) times daily. (Patient not taking: Reported on 03/03/2018) 3 Inhaler 3  . citalopram (  CELEXA) 40 MG tablet Take 1 tablet (40 mg total) by mouth at bedtime.    . citalopram (CELEXA) 40 MG tablet Take 1 tablet (40 mg total) by mouth daily. (Patient not taking: Reported on 03/03/2018) 90 tablet 0  . colchicine 0.6 MG tablet Take 1 tablet (0.6 mg total) by mouth 2 (two) times daily as needed (gout flare ups). 60 tablet 6  . diltiazem (CARDIZEM) 30 MG tablet Take 30 mg by mouth every 12 (twelve) hours.  0  . diltiazem (CARDIZEM) 60 MG tablet Take 1 tablet (60 mg total) by mouth every 12 (twelve) hours. (Patient not taking: Reported on 03/03/2018) 60 tablet 0  .  doxycycline (VIBRA-TABS) 100 MG tablet Take 1 tablet (100 mg total) by mouth every 12 (twelve) hours. (Patient not taking: Reported on 03/03/2018) 10 tablet 0  . feeding supplement, ENSURE ENLIVE, (ENSURE ENLIVE) LIQD Take 237 mLs by mouth 2 (two) times daily between meals. (Patient taking differently: Take 237 mLs by mouth See admin instructions. 237 mls --Three to four times per day.) 237 mL 12  . Fluticasone-Umeclidin-Vilant (TRELEGY ELLIPTA) 100-62.5-25 MCG/INH AEPB Inhale 1 puff into the lungs daily. 28 each 5  . furosemide (LASIX) 40 MG tablet Take 1 tablet (40 mg total) by mouth 2 (two) times daily. 30 tablet 0  . gabapentin (NEURONTIN) 300 MG capsule Take 1 capsule (300 mg total) by mouth 2 (two) times daily. 30 capsule 0  . lisinopril (PRINIVIL,ZESTRIL) 10 MG tablet Take 10 mg by mouth daily.  1  . nicotine (NICODERM CQ - DOSED IN MG/24 HOURS) 14 mg/24hr patch Place 1 patch (14 mg total) onto the skin daily. (Patient not taking: Reported on 01/04/2018) 28 patch 0  . nystatin cream (MYCOSTATIN) Apply 1 application topically 2 (two) times daily. (Patient not taking: Reported on 03/03/2018) 30 g 1  . Oxycodone HCl 10 MG TABS Take 1 tablet (10 mg total) by mouth every 8 (eight) hours as needed. (Patient taking differently: Take 10-20 mg by mouth every 4 (four) hours as needed (pain). ) 90 tablet 0  . Oxycodone HCl 10 MG TABS Take 1-2 tablets (10-20 mg total) by mouth every 4 (four) hours as needed. (Patient not taking: Reported on 03/03/2018) 180 tablet 0  . pantoprazole (PROTONIX) 40 MG tablet TAKE 1 TABLET BY MOUTH EVERY DAY 30 tablet 6  . potassium chloride 20 MEQ TBCR Take 20 mEq by mouth 2 (two) times daily for 4 days. 8 tablet 0  . potassium chloride SA (K-DUR,KLOR-CON) 20 MEQ tablet 1 tab po qd EVERY DAY 90 tablet 1  . predniSONE (DELTASONE) 20 MG tablet Take 1 tablet (20 mg total) by mouth daily with supper. 14 tablet 0  . Probiotic Product (PROBIOTIC PO) Take 1 capsule by mouth daily.    .  prochlorperazine (COMPAZINE) 10 MG tablet Take 1 tablet (10 mg total) by mouth every 6 (six) hours as needed for nausea or vomiting. 30 tablet 2  . tamsulosin (FLOMAX) 0.4 MG CAPS capsule Take 1 capsule (0.4 mg total) by mouth daily after supper. (Patient taking differently: Take 0.4 mg by mouth as needed (urination). ) 30 capsule 3  . traMADol (ULTRAM) 50 MG tablet TAKE 1 TABLET BY MOUTH EVERY 6HRS AS NEEDED (Patient taking differently: Take 50 mg by mouth every 6 (six) hours as needed for moderate pain. ) 90 tablet 0  . urea (CARMOL) 40 % CREA Apply to right lower leg once daily (Patient not taking: Reported on 03/03/2018) 1 each 6  No current facility-administered medications for this visit.      Allergies:  Allergies  Allergen Reactions  . Amitriptyline Hcl Nausea Only    REACTION: nausea, elevated bp  . Ezetimibe Other (See Comments)    REACTION: chest pain, fatigue    Past Medical History, Surgical history, Social history, and Family History reviewed and remain unchanged.   Physical Exam: Blood pressure (!) 158/80, pulse 61, temperature 98.1 F (36.7 C), temperature source Oral, resp. rate 17, height 5' 10.5" (1.791 m), weight 231 lb 4.8 oz (104.9 kg), SpO2 95 %.   ECOG: 1   General appearance: Comfortable appearing without any discomfort.  Head: Normocephalic without any trauma Oropharynx: Mucous membranes are moist and pink without any thrush or ulcers. Eyes: Pupils are equal and round reactive to light. Lymph nodes: No cervical, supraclavicular, inguinal or axillary lymphadenopathy.   Heart:regular rate and rhythm.  S1 and S2 without leg edema. Lung: Clear without any rhonchi or wheezes.  No dullness to percussion. Abdomin: Soft, nontender, nondistended with good bowel sounds.  No hepatosplenomegaly. Musculoskeletal: No joint deformity or effusion.  Full range of motion noted. Neurological: No deficits noted on motor, sensory and deep tendon reflex exam. Skin: No  petechial rash or dryness.  Appeared moist.  Psychiatric: Mood and affect appeared appropriate.      Lab Results: Lab Results  Component Value Date   WBC 6.9 03/03/2018   HGB 12.5 (L) 03/03/2018   HCT 37.5 (L) 03/03/2018   MCV 85.6 03/03/2018   PLT 292 03/03/2018     Chemistry      Component Value Date/Time   NA 140 03/03/2018 1405   NA 144 01/08/2018   NA 141 06/26/2017 1444   K 2.4 (LL) 03/03/2018 1405   K 4.3 06/26/2017 1444   CL 86 (L) 03/03/2018 1405   CO2 40 (H) 03/03/2018 1405   CO2 30 (H) 06/26/2017 1444   BUN 10 03/03/2018 1405   BUN 11 01/08/2018   BUN 18.0 06/26/2017 1444   CREATININE 0.67 03/03/2018 1405   CREATININE 0.80 12/16/2017 1510   CREATININE 1.0 06/26/2017 1444   GLU 75 01/08/2018      Component Value Date/Time   CALCIUM 9.6 03/03/2018 1405   CALCIUM 9.4 06/26/2017 1444   ALKPHOS 69 03/03/2018 1405   ALKPHOS 103 06/26/2017 1444   AST 26 03/03/2018 1405   AST 18 12/16/2017 1510   AST 20 06/26/2017 1444   ALT 14 03/03/2018 1405   ALT 12 12/16/2017 1510   ALT 21 06/26/2017 1444   BILITOT 1.1 03/03/2018 1405   BILITOT 0.5 12/16/2017 1510   BILITOT 0.47 06/26/2017 1444       EXAM: MRI LUMBAR SPINE WITHOUT CONTRAST  TECHNIQUE: Multiplanar, multisequence MR imaging of the lumbar spine was performed. No intravenous contrast was administered.  COMPARISON:  CT and bone scan 12/18/2017  FINDINGS: Segmentation:  5 lumbar type vertebral bodies.  Alignment:  Normal except for 2 mm of anterolisthesis L4-5.  Vertebrae: No fracture. No extraosseous tumor. Multiple metastases, affecting each of the lumbar vertebral bodies, extensively affecting the sacrum left more than right and extensively affecting the iliac bones left more than right.  Conus medullaris and cauda equina: Conus extends to the L1 level. Conus and cauda equina appear normal.  Paraspinal and other soft tissues: Negative  Disc levels:  Mild non-compressive disc  bulges at L3-4 and above.  At L4-5, there is bilateral facet arthropathy with 2 mm of anterolisthesis. There is mild bulging of  the disc. There is mild stenosis of the lateral recesses and foramina without definite neural compression.  L5-S1 shows mild bulging of the disc and mild facet arthritis but no stenosis.  IMPRESSION: Bony metastatic disease throughout the region. There is involvement of each lumbar vertebral body, of the sacrum left more than right and of the iliac bones left more than right. However, there does not appear to be a pathologic fracture, any extraosseous extent of tumor or pronounced edematous change. The findings could certainly be associated with back pain.  Ordinary age related degenerative changes in the lower lumbar spine. L4-5 shows facet arthropathy with 2 mm of anterolisthesis and mild bulging of the disc. Mild narrowing of the lateral recesses and foramina without definite neural compression.      Impression and Plan:   67 year old man with:  1.  Advanced prostate cancer that is castration- sensitive with disease to the bone.   He is currently receiving androgen deprivation and Zytiga with reasonable response to his PSA that has declined to 2.3 in June 2019 and his PSA from today is pending.  Despite the drop in his PSA continues to struggle with his cancer and continues to has poor quality of life.  His pain has been detrimental to his health and currently would like to stop all anticancer treatment.  The rationale for continuing anticancer treatment versus discontinuation was reviewed today in detail.  After discussion today, he opted to stop all anticancer treatment and proceed with hospice.  He understands that his cancer were likely progress but he understands that his disease is incurable in any case.  He will enroll in hospice in the near future but he knows that he can change his mind in the future if he opted to.  We will continue  Zytiga at this time.  2. Androgen deprivation therapy: Lupron will be discontinued for the time being.  3.  Insomnia and anxiety: Prescription for Ativan and Ambien was given to him today.  4. Pain: We have discussed about long-acting morphine which he is considering it and would like to do that under the care of hospice.  5.  Prognosis: Overall poor with limited life expectancy given the extent of his disease and despite adequate treatment he continues to suffer from his cancer.  6. Follow-up: Will be in 2 months to follow his progress.  25  minutes was spent with the patient face-to-face today.  More than 50% of time was dedicated to discussing the natural course of his disease, treatment options and rationale for hospice care is discontinuing aggressive treatments.    Zola Button, MD 8/29/201910:26 AM

## 2018-03-12 ENCOUNTER — Telehealth: Payer: Self-pay | Admitting: Family Medicine

## 2018-03-12 ENCOUNTER — Encounter: Payer: Self-pay | Admitting: Family Medicine

## 2018-03-12 ENCOUNTER — Telehealth: Payer: Self-pay | Admitting: *Deleted

## 2018-03-12 LAB — PROSTATE-SPECIFIC AG, SERUM (LABCORP): Prostate Specific Ag, Serum: 3.5 ng/mL (ref 0.0–4.0)

## 2018-03-12 NOTE — Telephone Encounter (Signed)
Noted. I reviewed Dr. Hazeline Junker office note from yesterday.

## 2018-03-12 NOTE — Telephone Encounter (Signed)
Returned [employer] phone call regarding patient's wishes to go from Palliative care to Hospice. Informed her that Dr. Alen Blew is in agreement with patient transferring into Hospice.

## 2018-03-12 NOTE — Telephone Encounter (Signed)
Copied from [place] [phone]. Topic: General - Other >> Mar 12, 2018 11:10 AM Adelene Idler wrote: Manus Gunning from [facility] is calling in wanting to advise pt is enrolling hospice services 03/17/2018

## 2018-03-17 DIAGNOSIS — G4733 Obstructive sleep apnea (adult) (pediatric): Secondary | ICD-10-CM | POA: Diagnosis not present

## 2018-03-17 DIAGNOSIS — C61 Malignant neoplasm of prostate: Secondary | ICD-10-CM | POA: Diagnosis not present

## 2018-03-17 DIAGNOSIS — R590 Localized enlarged lymph nodes: Secondary | ICD-10-CM | POA: Diagnosis not present

## 2018-03-17 DIAGNOSIS — C7951 Secondary malignant neoplasm of bone: Secondary | ICD-10-CM | POA: Diagnosis not present

## 2018-03-17 DIAGNOSIS — I509 Heart failure, unspecified: Secondary | ICD-10-CM | POA: Diagnosis not present

## 2018-03-17 DIAGNOSIS — Z87891 Personal history of nicotine dependence: Secondary | ICD-10-CM | POA: Diagnosis not present

## 2018-03-17 DIAGNOSIS — I2510 Atherosclerotic heart disease of native coronary artery without angina pectoris: Secondary | ICD-10-CM | POA: Diagnosis not present

## 2018-03-17 DIAGNOSIS — Z191 Hormone sensitive malignancy status: Secondary | ICD-10-CM | POA: Diagnosis not present

## 2018-03-17 DIAGNOSIS — I110 Hypertensive heart disease with heart failure: Secondary | ICD-10-CM | POA: Diagnosis not present

## 2018-03-17 DIAGNOSIS — I4891 Unspecified atrial fibrillation: Secondary | ICD-10-CM | POA: Diagnosis not present

## 2018-03-17 DIAGNOSIS — J449 Chronic obstructive pulmonary disease, unspecified: Secondary | ICD-10-CM | POA: Diagnosis not present

## 2018-03-18 ENCOUNTER — Telehealth: Payer: Self-pay | Admitting: *Deleted

## 2018-03-18 NOTE — Telephone Encounter (Signed)
As noted below by Dr. Alen Blew, I informed patient of his PSA result. He verbalized understanding.

## 2018-03-18 NOTE — Telephone Encounter (Signed)
-----   Message from Wyatt Portela, MD sent at 03/12/2018 10:19 AM EDT ----- Please let him know his PSA is up slightly.

## 2018-03-19 DIAGNOSIS — C7951 Secondary malignant neoplasm of bone: Secondary | ICD-10-CM | POA: Diagnosis not present

## 2018-03-19 DIAGNOSIS — I4891 Unspecified atrial fibrillation: Secondary | ICD-10-CM | POA: Diagnosis not present

## 2018-03-19 DIAGNOSIS — Z191 Hormone sensitive malignancy status: Secondary | ICD-10-CM | POA: Diagnosis not present

## 2018-03-19 DIAGNOSIS — I110 Hypertensive heart disease with heart failure: Secondary | ICD-10-CM | POA: Diagnosis not present

## 2018-03-19 DIAGNOSIS — C61 Malignant neoplasm of prostate: Secondary | ICD-10-CM | POA: Diagnosis not present

## 2018-03-19 DIAGNOSIS — R590 Localized enlarged lymph nodes: Secondary | ICD-10-CM | POA: Diagnosis not present

## 2018-03-24 DIAGNOSIS — I110 Hypertensive heart disease with heart failure: Secondary | ICD-10-CM | POA: Diagnosis not present

## 2018-03-24 DIAGNOSIS — Z191 Hormone sensitive malignancy status: Secondary | ICD-10-CM | POA: Diagnosis not present

## 2018-03-24 DIAGNOSIS — R590 Localized enlarged lymph nodes: Secondary | ICD-10-CM | POA: Diagnosis not present

## 2018-03-24 DIAGNOSIS — I4891 Unspecified atrial fibrillation: Secondary | ICD-10-CM | POA: Diagnosis not present

## 2018-03-24 DIAGNOSIS — C61 Malignant neoplasm of prostate: Secondary | ICD-10-CM | POA: Diagnosis not present

## 2018-03-24 DIAGNOSIS — C7951 Secondary malignant neoplasm of bone: Secondary | ICD-10-CM | POA: Diagnosis not present

## 2018-03-31 ENCOUNTER — Ambulatory Visit: Payer: Medicare Other | Admitting: Pulmonary Disease

## 2018-03-31 DIAGNOSIS — C7951 Secondary malignant neoplasm of bone: Secondary | ICD-10-CM | POA: Diagnosis not present

## 2018-03-31 DIAGNOSIS — C61 Malignant neoplasm of prostate: Secondary | ICD-10-CM | POA: Diagnosis not present

## 2018-03-31 DIAGNOSIS — R590 Localized enlarged lymph nodes: Secondary | ICD-10-CM | POA: Diagnosis not present

## 2018-03-31 DIAGNOSIS — I4891 Unspecified atrial fibrillation: Secondary | ICD-10-CM | POA: Diagnosis not present

## 2018-03-31 DIAGNOSIS — Z191 Hormone sensitive malignancy status: Secondary | ICD-10-CM | POA: Diagnosis not present

## 2018-03-31 DIAGNOSIS — I110 Hypertensive heart disease with heart failure: Secondary | ICD-10-CM | POA: Diagnosis not present

## 2018-04-02 DIAGNOSIS — R590 Localized enlarged lymph nodes: Secondary | ICD-10-CM | POA: Diagnosis not present

## 2018-04-02 DIAGNOSIS — C61 Malignant neoplasm of prostate: Secondary | ICD-10-CM | POA: Diagnosis not present

## 2018-04-02 DIAGNOSIS — I110 Hypertensive heart disease with heart failure: Secondary | ICD-10-CM | POA: Diagnosis not present

## 2018-04-02 DIAGNOSIS — Z191 Hormone sensitive malignancy status: Secondary | ICD-10-CM | POA: Diagnosis not present

## 2018-04-02 DIAGNOSIS — C7951 Secondary malignant neoplasm of bone: Secondary | ICD-10-CM | POA: Diagnosis not present

## 2018-04-02 DIAGNOSIS — I4891 Unspecified atrial fibrillation: Secondary | ICD-10-CM | POA: Diagnosis not present

## 2018-04-05 ENCOUNTER — Telehealth: Payer: Self-pay | Admitting: *Deleted

## 2018-04-05 NOTE — Telephone Encounter (Signed)
Pt called requesting a refill of Ambien. States he has a few tablets left.

## 2018-04-06 ENCOUNTER — Telehealth: Payer: Self-pay | Admitting: *Deleted

## 2018-04-06 ENCOUNTER — Other Ambulatory Visit: Payer: Self-pay | Admitting: *Deleted

## 2018-04-06 MED ORDER — ZOLPIDEM TARTRATE 10 MG PO TABS: 10.0000 mg | ORAL_TABLET | Freq: Every evening | ORAL | 0 refills | Status: AC | PRN

## 2018-04-06 NOTE — Telephone Encounter (Signed)
Ok to refill 

## 2018-04-06 NOTE — Telephone Encounter (Signed)
Attempted to refill patient's [employer]. spoke with pharmacist and he states a script for Scott Rollins #[id number] is waiting for patient from dr Art gallery manager and may be refilled on 04/08/18. Patient notified.

## 2018-04-07 DIAGNOSIS — C7951 Secondary malignant neoplasm of bone: Secondary | ICD-10-CM | POA: Diagnosis not present

## 2018-04-07 DIAGNOSIS — R590 Localized enlarged lymph nodes: Secondary | ICD-10-CM | POA: Diagnosis not present

## 2018-04-07 DIAGNOSIS — Z191 Hormone sensitive malignancy status: Secondary | ICD-10-CM | POA: Diagnosis not present

## 2018-04-07 DIAGNOSIS — I4891 Unspecified atrial fibrillation: Secondary | ICD-10-CM | POA: Diagnosis not present

## 2018-04-07 DIAGNOSIS — I110 Hypertensive heart disease with heart failure: Secondary | ICD-10-CM | POA: Diagnosis not present

## 2018-04-07 DIAGNOSIS — C61 Malignant neoplasm of prostate: Secondary | ICD-10-CM | POA: Diagnosis not present

## 2018-04-13 DIAGNOSIS — C61 Malignant neoplasm of prostate: Secondary | ICD-10-CM | POA: Diagnosis not present

## 2018-04-13 DIAGNOSIS — R590 Localized enlarged lymph nodes: Secondary | ICD-10-CM | POA: Diagnosis not present

## 2018-04-13 DIAGNOSIS — I2510 Atherosclerotic heart disease of native coronary artery without angina pectoris: Secondary | ICD-10-CM | POA: Diagnosis not present

## 2018-04-13 DIAGNOSIS — G4733 Obstructive sleep apnea (adult) (pediatric): Secondary | ICD-10-CM | POA: Diagnosis not present

## 2018-04-13 DIAGNOSIS — C7951 Secondary malignant neoplasm of bone: Secondary | ICD-10-CM | POA: Diagnosis not present

## 2018-04-13 DIAGNOSIS — Z87891 Personal history of nicotine dependence: Secondary | ICD-10-CM | POA: Diagnosis not present

## 2018-04-13 DIAGNOSIS — I4891 Unspecified atrial fibrillation: Secondary | ICD-10-CM | POA: Diagnosis not present

## 2018-04-13 DIAGNOSIS — I509 Heart failure, unspecified: Secondary | ICD-10-CM | POA: Diagnosis not present

## 2018-04-13 DIAGNOSIS — I110 Hypertensive heart disease with heart failure: Secondary | ICD-10-CM | POA: Diagnosis not present

## 2018-04-13 DIAGNOSIS — Z191 Hormone sensitive malignancy status: Secondary | ICD-10-CM | POA: Diagnosis not present

## 2018-04-13 DIAGNOSIS — J449 Chronic obstructive pulmonary disease, unspecified: Secondary | ICD-10-CM | POA: Diagnosis not present

## 2018-04-14 DIAGNOSIS — C61 Malignant neoplasm of prostate: Secondary | ICD-10-CM | POA: Diagnosis not present

## 2018-04-14 DIAGNOSIS — C7951 Secondary malignant neoplasm of bone: Secondary | ICD-10-CM | POA: Diagnosis not present

## 2018-04-14 DIAGNOSIS — I4891 Unspecified atrial fibrillation: Secondary | ICD-10-CM | POA: Diagnosis not present

## 2018-04-14 DIAGNOSIS — Z191 Hormone sensitive malignancy status: Secondary | ICD-10-CM | POA: Diagnosis not present

## 2018-04-14 DIAGNOSIS — R590 Localized enlarged lymph nodes: Secondary | ICD-10-CM | POA: Diagnosis not present

## 2018-04-14 DIAGNOSIS — I110 Hypertensive heart disease with heart failure: Secondary | ICD-10-CM | POA: Diagnosis not present

## 2018-04-15 DIAGNOSIS — I4891 Unspecified atrial fibrillation: Secondary | ICD-10-CM | POA: Diagnosis not present

## 2018-04-15 DIAGNOSIS — Z191 Hormone sensitive malignancy status: Secondary | ICD-10-CM | POA: Diagnosis not present

## 2018-04-15 DIAGNOSIS — C61 Malignant neoplasm of prostate: Secondary | ICD-10-CM | POA: Diagnosis not present

## 2018-04-15 DIAGNOSIS — I110 Hypertensive heart disease with heart failure: Secondary | ICD-10-CM | POA: Diagnosis not present

## 2018-04-15 DIAGNOSIS — C7951 Secondary malignant neoplasm of bone: Secondary | ICD-10-CM | POA: Diagnosis not present

## 2018-04-15 DIAGNOSIS — R590 Localized enlarged lymph nodes: Secondary | ICD-10-CM | POA: Diagnosis not present

## 2018-04-21 DIAGNOSIS — R590 Localized enlarged lymph nodes: Secondary | ICD-10-CM | POA: Diagnosis not present

## 2018-04-21 DIAGNOSIS — Z191 Hormone sensitive malignancy status: Secondary | ICD-10-CM | POA: Diagnosis not present

## 2018-04-21 DIAGNOSIS — C7951 Secondary malignant neoplasm of bone: Secondary | ICD-10-CM | POA: Diagnosis not present

## 2018-04-21 DIAGNOSIS — I110 Hypertensive heart disease with heart failure: Secondary | ICD-10-CM | POA: Diagnosis not present

## 2018-04-21 DIAGNOSIS — C61 Malignant neoplasm of prostate: Secondary | ICD-10-CM | POA: Diagnosis not present

## 2018-04-21 DIAGNOSIS — I4891 Unspecified atrial fibrillation: Secondary | ICD-10-CM | POA: Diagnosis not present

## 2018-04-22 DIAGNOSIS — I110 Hypertensive heart disease with heart failure: Secondary | ICD-10-CM | POA: Diagnosis not present

## 2018-04-22 DIAGNOSIS — C61 Malignant neoplasm of prostate: Secondary | ICD-10-CM | POA: Diagnosis not present

## 2018-04-22 DIAGNOSIS — C7951 Secondary malignant neoplasm of bone: Secondary | ICD-10-CM | POA: Diagnosis not present

## 2018-04-22 DIAGNOSIS — Z191 Hormone sensitive malignancy status: Secondary | ICD-10-CM | POA: Diagnosis not present

## 2018-04-22 DIAGNOSIS — I4891 Unspecified atrial fibrillation: Secondary | ICD-10-CM | POA: Diagnosis not present

## 2018-04-22 DIAGNOSIS — R590 Localized enlarged lymph nodes: Secondary | ICD-10-CM | POA: Diagnosis not present

## 2018-04-28 DIAGNOSIS — I110 Hypertensive heart disease with heart failure: Secondary | ICD-10-CM | POA: Diagnosis not present

## 2018-04-28 DIAGNOSIS — C61 Malignant neoplasm of prostate: Secondary | ICD-10-CM | POA: Diagnosis not present

## 2018-04-28 DIAGNOSIS — C7951 Secondary malignant neoplasm of bone: Secondary | ICD-10-CM | POA: Diagnosis not present

## 2018-04-28 DIAGNOSIS — R590 Localized enlarged lymph nodes: Secondary | ICD-10-CM | POA: Diagnosis not present

## 2018-04-28 DIAGNOSIS — Z191 Hormone sensitive malignancy status: Secondary | ICD-10-CM | POA: Diagnosis not present

## 2018-04-28 DIAGNOSIS — I4891 Unspecified atrial fibrillation: Secondary | ICD-10-CM | POA: Diagnosis not present

## 2018-04-29 DIAGNOSIS — C61 Malignant neoplasm of prostate: Secondary | ICD-10-CM | POA: Diagnosis not present

## 2018-04-29 DIAGNOSIS — Z191 Hormone sensitive malignancy status: Secondary | ICD-10-CM | POA: Diagnosis not present

## 2018-04-29 DIAGNOSIS — C7951 Secondary malignant neoplasm of bone: Secondary | ICD-10-CM | POA: Diagnosis not present

## 2018-04-29 DIAGNOSIS — I4891 Unspecified atrial fibrillation: Secondary | ICD-10-CM | POA: Diagnosis not present

## 2018-04-29 DIAGNOSIS — I110 Hypertensive heart disease with heart failure: Secondary | ICD-10-CM | POA: Diagnosis not present

## 2018-04-29 DIAGNOSIS — R590 Localized enlarged lymph nodes: Secondary | ICD-10-CM | POA: Diagnosis not present

## 2018-05-05 ENCOUNTER — Telehealth: Payer: Self-pay | Admitting: Oncology

## 2018-05-05 NOTE — Telephone Encounter (Signed)
Patient called to verify appointment which is on 05/12/18 - 9:30 am lab/10:00 Dr. Alen Blew.

## 2018-05-06 DIAGNOSIS — C61 Malignant neoplasm of prostate: Secondary | ICD-10-CM | POA: Diagnosis not present

## 2018-05-06 DIAGNOSIS — R590 Localized enlarged lymph nodes: Secondary | ICD-10-CM | POA: Diagnosis not present

## 2018-05-06 DIAGNOSIS — Z191 Hormone sensitive malignancy status: Secondary | ICD-10-CM | POA: Diagnosis not present

## 2018-05-06 DIAGNOSIS — C7951 Secondary malignant neoplasm of bone: Secondary | ICD-10-CM | POA: Diagnosis not present

## 2018-05-06 DIAGNOSIS — I4891 Unspecified atrial fibrillation: Secondary | ICD-10-CM | POA: Diagnosis not present

## 2018-05-06 DIAGNOSIS — I110 Hypertensive heart disease with heart failure: Secondary | ICD-10-CM | POA: Diagnosis not present

## 2018-05-09 ENCOUNTER — Emergency Department (HOSPITAL_COMMUNITY)

## 2018-05-09 ENCOUNTER — Inpatient Hospital Stay (HOSPITAL_COMMUNITY)

## 2018-05-09 ENCOUNTER — Encounter (HOSPITAL_COMMUNITY): Payer: Self-pay

## 2018-05-09 ENCOUNTER — Inpatient Hospital Stay (HOSPITAL_COMMUNITY)
Admission: EM | Admit: 2018-05-09 | Discharge: 2018-05-12 | DRG: 291 | Disposition: A | Discharge status: Hospice | Attending: Internal Medicine | Admitting: Internal Medicine

## 2018-05-09 DIAGNOSIS — I248 Other forms of acute ischemic heart disease: Secondary | ICD-10-CM | POA: Diagnosis present

## 2018-05-09 DIAGNOSIS — Z7951 Long term (current) use of inhaled steroids: Secondary | ICD-10-CM

## 2018-05-09 DIAGNOSIS — I2583 Coronary atherosclerosis due to lipid rich plaque: Secondary | ICD-10-CM | POA: Diagnosis present

## 2018-05-09 DIAGNOSIS — N179 Acute kidney failure, unspecified: Secondary | ICD-10-CM | POA: Diagnosis present

## 2018-05-09 DIAGNOSIS — L0390 Cellulitis, unspecified: Secondary | ICD-10-CM | POA: Diagnosis not present

## 2018-05-09 DIAGNOSIS — I5033 Acute on chronic diastolic (congestive) heart failure: Secondary | ICD-10-CM | POA: Diagnosis present

## 2018-05-09 DIAGNOSIS — C7951 Secondary malignant neoplasm of bone: Secondary | ICD-10-CM | POA: Diagnosis not present

## 2018-05-09 DIAGNOSIS — J441 Chronic obstructive pulmonary disease with (acute) exacerbation: Secondary | ICD-10-CM | POA: Diagnosis present

## 2018-05-09 DIAGNOSIS — L03115 Cellulitis of right lower limb: Secondary | ICD-10-CM | POA: Diagnosis present

## 2018-05-09 DIAGNOSIS — J449 Chronic obstructive pulmonary disease, unspecified: Secondary | ICD-10-CM | POA: Diagnosis present

## 2018-05-09 DIAGNOSIS — I2510 Atherosclerotic heart disease of native coronary artery without angina pectoris: Secondary | ICD-10-CM | POA: Diagnosis not present

## 2018-05-09 DIAGNOSIS — E782 Mixed hyperlipidemia: Secondary | ICD-10-CM | POA: Diagnosis present

## 2018-05-09 DIAGNOSIS — G8929 Other chronic pain: Secondary | ICD-10-CM | POA: Diagnosis present

## 2018-05-09 DIAGNOSIS — R0902 Hypoxemia: Secondary | ICD-10-CM | POA: Diagnosis not present

## 2018-05-09 DIAGNOSIS — J9622 Acute and chronic respiratory failure with hypercapnia: Secondary | ICD-10-CM | POA: Diagnosis present

## 2018-05-09 DIAGNOSIS — Z191 Hormone sensitive malignancy status: Secondary | ICD-10-CM | POA: Diagnosis not present

## 2018-05-09 DIAGNOSIS — I341 Nonrheumatic mitral (valve) prolapse: Secondary | ICD-10-CM | POA: Diagnosis present

## 2018-05-09 DIAGNOSIS — S90521A Blister (nonthermal), right ankle, initial encounter: Secondary | ICD-10-CM

## 2018-05-09 DIAGNOSIS — Z7982 Long term (current) use of aspirin: Secondary | ICD-10-CM

## 2018-05-09 DIAGNOSIS — G629 Polyneuropathy, unspecified: Secondary | ICD-10-CM | POA: Diagnosis present

## 2018-05-09 DIAGNOSIS — I4891 Unspecified atrial fibrillation: Secondary | ICD-10-CM | POA: Diagnosis present

## 2018-05-09 DIAGNOSIS — J9621 Acute and chronic respiratory failure with hypoxia: Secondary | ICD-10-CM | POA: Diagnosis present

## 2018-05-09 DIAGNOSIS — R590 Localized enlarged lymph nodes: Secondary | ICD-10-CM | POA: Diagnosis not present

## 2018-05-09 DIAGNOSIS — R069 Unspecified abnormalities of breathing: Secondary | ICD-10-CM | POA: Diagnosis not present

## 2018-05-09 DIAGNOSIS — G4733 Obstructive sleep apnea (adult) (pediatric): Secondary | ICD-10-CM | POA: Diagnosis present

## 2018-05-09 DIAGNOSIS — Z7901 Long term (current) use of anticoagulants: Secondary | ICD-10-CM

## 2018-05-09 DIAGNOSIS — R609 Edema, unspecified: Secondary | ICD-10-CM | POA: Diagnosis not present

## 2018-05-09 DIAGNOSIS — I249 Acute ischemic heart disease, unspecified: Secondary | ICD-10-CM | POA: Diagnosis present

## 2018-05-09 DIAGNOSIS — I480 Paroxysmal atrial fibrillation: Secondary | ICD-10-CM | POA: Diagnosis present

## 2018-05-09 DIAGNOSIS — I214 Non-ST elevation (NSTEMI) myocardial infarction: Secondary | ICD-10-CM

## 2018-05-09 DIAGNOSIS — Z515 Encounter for palliative care: Secondary | ICD-10-CM | POA: Diagnosis present

## 2018-05-09 DIAGNOSIS — R0602 Shortness of breath: Secondary | ICD-10-CM | POA: Diagnosis not present

## 2018-05-09 DIAGNOSIS — I10 Essential (primary) hypertension: Secondary | ICD-10-CM | POA: Diagnosis not present

## 2018-05-09 DIAGNOSIS — I110 Hypertensive heart disease with heart failure: Secondary | ICD-10-CM | POA: Diagnosis not present

## 2018-05-09 DIAGNOSIS — I252 Old myocardial infarction: Secondary | ICD-10-CM

## 2018-05-09 DIAGNOSIS — C61 Malignant neoplasm of prostate: Secondary | ICD-10-CM | POA: Diagnosis not present

## 2018-05-09 DIAGNOSIS — Z87891 Personal history of nicotine dependence: Secondary | ICD-10-CM

## 2018-05-09 DIAGNOSIS — M109 Gout, unspecified: Secondary | ICD-10-CM | POA: Diagnosis present

## 2018-05-09 DIAGNOSIS — Z23 Encounter for immunization: Secondary | ICD-10-CM

## 2018-05-09 DIAGNOSIS — R7303 Prediabetes: Secondary | ICD-10-CM | POA: Diagnosis present

## 2018-05-09 DIAGNOSIS — Z9221 Personal history of antineoplastic chemotherapy: Secondary | ICD-10-CM

## 2018-05-09 DIAGNOSIS — Z79899 Other long term (current) drug therapy: Secondary | ICD-10-CM

## 2018-05-09 DIAGNOSIS — J9602 Acute respiratory failure with hypercapnia: Secondary | ICD-10-CM | POA: Diagnosis not present

## 2018-05-09 DIAGNOSIS — G40909 Epilepsy, unspecified, not intractable, without status epilepticus: Secondary | ICD-10-CM | POA: Diagnosis present

## 2018-05-09 DIAGNOSIS — Z888 Allergy status to other drugs, medicaments and biological substances status: Secondary | ICD-10-CM

## 2018-05-09 DIAGNOSIS — Z955 Presence of coronary angioplasty implant and graft: Secondary | ICD-10-CM

## 2018-05-09 DIAGNOSIS — Z9981 Dependence on supplemental oxygen: Secondary | ICD-10-CM

## 2018-05-09 DIAGNOSIS — Z6832 Body mass index (BMI) 32.0-32.9, adult: Secondary | ICD-10-CM | POA: Diagnosis not present

## 2018-05-09 DIAGNOSIS — L97909 Non-pressure chronic ulcer of unspecified part of unspecified lower leg with unspecified severity: Secondary | ICD-10-CM

## 2018-05-09 LAB — BASIC METABOLIC PANEL
Anion gap: 11 (ref 5–15)
BUN: 50 mg/dL — ABNORMAL HIGH (ref 8–23)
CO2: 30 mmol/L (ref 22–32)
Calcium: 9 mg/dL (ref 8.9–10.3)
Chloride: 97 mmol/L — ABNORMAL LOW (ref 98–111)
Creatinine, Ser: 1.55 mg/dL — ABNORMAL HIGH (ref 0.61–1.24)
GFR calc Af Amer: 51 mL/min — ABNORMAL LOW (ref >60)
GFR, EST NON AFRICAN AMERICAN: 44 mL/min — AB (ref >60)
Glucose, Bld: 134 mg/dL — ABNORMAL HIGH (ref 70–99)
POTASSIUM: 4.4 mmol/L (ref 3.5–5.1)
SODIUM: 138 mmol/L (ref 135–145)

## 2018-05-09 LAB — CBC WITH DIFFERENTIAL/PLATELET
Abs Immature Granulocytes: 0.4 10*3/uL — ABNORMAL HIGH (ref 0.00–0.07)
BASOS PCT: 0 %
Basophils Absolute: 0.1 10*3/uL (ref 0.0–0.1)
Eosinophils Absolute: 0 10*3/uL (ref 0.0–0.5)
Eosinophils Relative: 0 %
HCT: 48.7 % (ref 39.0–52.0)
Hemoglobin: 14.8 g/dL (ref 13.0–17.0)
Immature Granulocytes: 3 %
Lymphocytes Relative: 9 %
Lymphs Abs: 1.4 10*3/uL (ref 0.7–4.0)
MCH: 28 pg (ref 26.0–34.0)
MCHC: 30.4 g/dL (ref 30.0–36.0)
MCV: 92.2 fL (ref 80.0–100.0)
MONO ABS: 0.6 10*3/uL (ref 0.1–1.0)
Monocytes Relative: 4 %
NEUTROS PCT: 84 %
NRBC: 0.1 % (ref 0.0–0.2)
Neutro Abs: 13.2 10*3/uL — ABNORMAL HIGH (ref 1.7–7.7)
PLATELETS: 187 10*3/uL (ref 150–400)
RBC: 5.28 MIL/uL (ref 4.22–5.81)
RDW: 15.5 % (ref 11.5–15.5)
WBC: 15.7 10*3/uL — AB (ref 4.0–10.5)

## 2018-05-09 LAB — APTT: APTT: 28 s (ref 24–36)

## 2018-05-09 LAB — PHOSPHORUS: Phosphorus: 4.4 mg/dL (ref 2.5–4.6)

## 2018-05-09 LAB — I-STAT ARTERIAL BLOOD GAS, ED
Acid-Base Excess: 7 mmol/L — ABNORMAL HIGH (ref 0.0–2.0)
BICARBONATE: 36.6 mmol/L — AB (ref 20.0–28.0)
O2 SAT: 98 %
PH ART: 7.304 — AB (ref 7.350–7.450)
TCO2: 39 mmol/L — AB (ref 22–32)
pCO2 arterial: 72.6 mmHg (ref 32.0–48.0)
pO2, Arterial: 114 mmHg — ABNORMAL HIGH (ref 83.0–108.0)

## 2018-05-09 LAB — PROTIME-INR
INR: 0.92
PROTHROMBIN TIME: 12.3 s (ref 11.4–15.2)

## 2018-05-09 LAB — BRAIN NATRIURETIC PEPTIDE: B NATRIURETIC PEPTIDE 5: 704.7 pg/mL — AB (ref 0.0–100.0)

## 2018-05-09 LAB — HEPARIN LEVEL (UNFRACTIONATED)
Heparin Unfractionated: <0.1 IU/mL — ABNORMAL LOW (ref 0.30–0.70)
Heparin Unfractionated: 0.4 IU/mL (ref 0.30–0.70)

## 2018-05-09 LAB — I-STAT TROPONIN, ED: TROPONIN I, POC: 0.86 ng/mL — AB (ref 0.00–0.08)

## 2018-05-09 LAB — TROPONIN I
TROPONIN I: 0.93 ng/mL — AB (ref <0.03)
TROPONIN I: 1.06 ng/mL — AB (ref <0.03)

## 2018-05-09 LAB — MAGNESIUM: Magnesium: 2.1 mg/dL (ref 1.7–2.4)

## 2018-05-09 LAB — MRSA PCR SCREENING: MRSA by PCR: NEGATIVE

## 2018-05-09 MED ORDER — ALBUTEROL SULFATE (2.5 MG/3ML) 0.083% IN NEBU: 2.5000 mg | INHALATION_SOLUTION | RESPIRATORY_TRACT | Status: DC | PRN

## 2018-05-09 MED ORDER — ENALAPRILAT 1.25 MG/ML IV SOLN
0.6250 mg | Freq: Four times a day (QID) | INTRAVENOUS | Status: DC
  Administered 2018-05-10 – 2018-05-11 (×6): 0.625 mg via INTRAVENOUS
  Filled 2018-05-09 (×6): qty 0.5

## 2018-05-09 MED ORDER — PIPERACILLIN-TAZOBACTAM 3.375 G IVPB 30 MIN
3.3750 g | Freq: Three times a day (TID) | INTRAVENOUS | Status: DC
  Administered 2018-05-09 – 2018-05-10 (×3): 3.375 g via INTRAVENOUS
  Filled 2018-05-09 (×6): qty 50

## 2018-05-09 MED ORDER — ONDANSETRON HCL 4 MG PO TABS: 4.0000 mg | ORAL_TABLET | Freq: Four times a day (QID) | ORAL | Status: DC | PRN

## 2018-05-09 MED ORDER — ACETAMINOPHEN 325 MG PO TABS
650.0000 mg | ORAL_TABLET | Freq: Four times a day (QID) | ORAL | Status: DC | PRN
  Administered 2018-05-09: 650 mg via ORAL
  Filled 2018-05-09: qty 2

## 2018-05-09 MED ORDER — ACETAMINOPHEN 650 MG RE SUPP: 650.0000 mg | Freq: Four times a day (QID) | RECTAL | Status: DC | PRN

## 2018-05-09 MED ORDER — VANCOMYCIN HCL 10 G IV SOLR
1500.0000 mg | INTRAVENOUS | Status: DC
  Filled 2018-05-09: qty 1500

## 2018-05-09 MED ORDER — HEPARIN (PORCINE) IN NACL 100-0.45 UNIT/ML-% IJ SOLN
1200.0000 [IU]/h | INTRAMUSCULAR | Status: AC
  Administered 2018-05-09 (×2): 1450 [IU]/h via INTRAVENOUS
  Administered 2018-05-10: 1400 [IU]/h via INTRAVENOUS
  Filled 2018-05-09 (×3): qty 250

## 2018-05-09 MED ORDER — FUROSEMIDE 10 MG/ML IJ SOLN
40.0000 mg | Freq: Two times a day (BID) | INTRAMUSCULAR | Status: DC
  Administered 2018-05-09 – 2018-05-10 (×2): 40 mg via INTRAVENOUS
  Filled 2018-05-09 (×2): qty 4

## 2018-05-09 MED ORDER — BUDESONIDE 0.5 MG/2ML IN SUSP: 2.0000 mg | Freq: Four times a day (QID) | RESPIRATORY_TRACT | Status: DC

## 2018-05-09 MED ORDER — ONDANSETRON HCL 4 MG/2ML IJ SOLN: 4.0000 mg | Freq: Four times a day (QID) | INTRAMUSCULAR | Status: DC | PRN

## 2018-05-09 MED ORDER — VANCOMYCIN HCL IN DEXTROSE 1-5 GM/200ML-% IV SOLN
1000.0000 mg | INTRAVENOUS | Status: AC
  Administered 2018-05-09: 1000 mg via INTRAVENOUS
  Filled 2018-05-09: qty 200

## 2018-05-09 MED ORDER — FUROSEMIDE 10 MG/ML IJ SOLN
40.0000 mg | Freq: Once | INTRAMUSCULAR | Status: AC
  Administered 2018-05-09: 40 mg via INTRAVENOUS
  Filled 2018-05-09: qty 4

## 2018-05-09 MED ORDER — METOPROLOL TARTRATE 5 MG/5ML IV SOLN
5.0000 mg | Freq: Four times a day (QID) | INTRAVENOUS | Status: DC
  Administered 2018-05-09 – 2018-05-10 (×4): 5 mg via INTRAVENOUS
  Filled 2018-05-09 (×4): qty 5

## 2018-05-09 MED ORDER — HEPARIN BOLUS VIA INFUSION
4000.0000 [IU] | Freq: Once | INTRAVENOUS | Status: AC
  Administered 2018-05-09: 4000 [IU] via INTRAVENOUS
  Filled 2018-05-09: qty 4000

## 2018-05-09 MED ORDER — SODIUM CHLORIDE 0.9 % IV BOLUS
500.0000 mL | Freq: Once | INTRAVENOUS | Status: AC
  Administered 2018-05-09: 500 mL via INTRAVENOUS

## 2018-05-09 MED ORDER — BUDESONIDE 0.5 MG/2ML IN SUSP
0.5000 mg | Freq: Two times a day (BID) | RESPIRATORY_TRACT | Status: DC
  Administered 2018-05-09 – 2018-05-12 (×7): 0.5 mg via RESPIRATORY_TRACT
  Filled 2018-05-09 (×8): qty 2

## 2018-05-09 NOTE — Progress Notes (Signed)
Pt taken off NIV and placed on 2L N/C.  Pt resting comfortably at this time.  Will continue to monitor.

## 2018-05-09 NOTE — ED Notes (Signed)
Dr Tamera Punt informed of troponin results .[place]

## 2018-05-09 NOTE — Progress Notes (Signed)
[place] for Heparin Indication: chest pain/ACS  Allergies  Allergen Reactions  . Amitriptyline Hcl Nausea Only    REACTION: nausea, elevated bp  . Ezetimibe Other (See Comments)    REACTION: chest pain, fatigue    Patient Measurements: Weight: 229 lb 4.5 oz (104 kg)(Per RN ) Heparin Dosing Weight: 97kg  Vital Signs: Temp: 98.2 F (36.8 C) (10/27 2216) Temp Source: Oral (10/27 2216) BP: 146/90 (10/27 2100) Pulse Rate: 70 (10/27 2100)  Labs: Recent Labs    05/09/18 0440 05/09/18 1005 05/09/18 1136 05/09/18 2132  HGB 14.8  --   --   --   HCT 48.7  --   --   --   PLT 187  --   --   --   APTT  --   --  28  --   LABPROT  --  SPECIMEN CLOTTED 12.3  --   INR  --  SPECIMEN CLOTTED 0.92  --   HEPARINUNFRC  --   --  <0.10* 0.40  CREATININE 1.55*  --   --   --   TROPONINI  --   --  0.93* 1.06*    Estimated Creatinine Clearance: 55.5 mL/min (A) (by C-G formula based on SCr of 1.55 mg/dL (H)).   Medical History: Past Medical History:  Diagnosis Date  . Anginal pain ([place])   . Anxiety   . Arthritis    "hips, knees, thoracic back" (01/22/2017)  . CAP (community acquired pneumonia) 11/2016  . 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 COPD (chronic obstructive pulmonary disease) ([place])    "pearl study" pt thinks he is on Symbicort; pt noncompliant with COPD controller meds on/off due to financial reasons.  . Coronary atherosclerosis of unspecified type of vessel, native or graft 2005   MI, s/p PCI with stent (pt reports 20+ interventions in the past, most recent cat 6/08 showed patent stents).  Normal LV function.  Nuclear stress test NEG 8/09.  . Depression    ?bipolar dx by psychiatrist?  . Gouty arthritis    Dr. Amil Amen  . Heart murmur   . Hematochezia 09/2012   Hyperplastic polyp, diverticulosis, and internal hemorrhoids found on colonoscopy 10/2012  . History of hiatal hernia   . Hyperkalemia  01/29/2017   Kayexalate 30 given x 1.  . Hyperlipidemia    Hx of multiple statin intolerance  . Hypertension   . 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 Migraine    "none in the 2000s" (01/15/2017)  . Mitral valve prolapse ~ 1981  . Neuropathic pain of both feet 2015/2016   Vit B12 and A1c normal 10/2014  . Obesities, morbid ([place])   . On home oxygen therapy    "2L prn" (01/15/2017)  . OSA on CPAP    w/oxygen (01/15/2017).  Noncompliance 2019.  Dr. Halford Chessman to repeat split night sleep study as of 12/2017 (not done as of 03/03/18)  . Osteoarthrosis, unspecified whether generalized or localized, unspecified site    DDD  . PAF (paroxysmal atrial fibrillation) ([place]) 12/2016   Started in the context of resp illness/lots of bronchodilators.  Started eliquis and cardizem during hospitalization 01/2017 (Dr. Rayann Heman).  . Pre-diabetes 2016/17  . Prostate cancer metastatic to multiple sites [facility]) 09/2010; 2018   2012: Localized, high risk disease (Dr. Kimbrough/Grape[place]):  s/p 5 wks ext bm rad + seed boost, as well as hormone blockade x 1 yr.  Biochemical recurrence 11/2015;  2018 imaging showed bone mets and lymph node involvement.  Palliative Lupron and zytiga 2018/19--good PSA response as of 09/2017 onc f/u. **  To start hospice 03/17/18  . Radiation    Pelvic--for prostate ca: 25 treatments/01/2011  . Seizure disorder ([place]) 08-15-2011   Deja vu sensations: no w/u.  These stopped when neurontin 300mg  tid was started for a different reason.  . Status post chemotherapy    10/2010 thru 06/2011  . Tobacco dependence    still smoking as of fall 2018.    Medications:  Medications Prior to Admission  Medication Sig Dispense Refill Last Dose  . abiraterone acetate (ZYTIGA) 250 MG tablet Take 4 tablets (1,000 mg total) by mouth daily. Take on an empty stomach 1 hour before or 2 hours after a meal 120 tablet 0  unknown  . albuterol (PROAIR HFA) 108 (90 Base) MCG/ACT inhaler Inhale 2 puffs into the lungs every 6 (six) hours as needed for wheezing or shortness of breath. 18 g 3 05/08/2018 at Unknown time  . albuterol (PROVENTIL) (2.5 MG/3ML) 0.083% nebulizer solution Take 3 mLs (2.5 mg total) by nebulization every 4 (four) hours as needed for wheezing or shortness of breath. 225 mL 3 05/08/2018 at Unknown time  . allopurinol (ZYLOPRIM) 300 MG tablet Take 1 tablet (300 mg total) by mouth daily. 90 tablet 3 05/08/2018 at Unknown time  . apixaban (ELIQUIS) 5 MG TABS tablet Take 1 tablet (5 mg total) by mouth 2 (two) times daily. 180 tablet 1 05/08/2018 at Unknown time  . aspirin EC 81 MG EC tablet Take 1 tablet (81 mg total) by mouth daily. 30 tablet 0 05/08/2018 at Unknown time  . citalopram (CELEXA) 40 MG tablet Take 1 tablet (40 mg total) by mouth at bedtime.   05/08/2018 at Unknown time  . dexamethasone (DECADRON) 4 MG tablet Take 12 mg by mouth daily.  0 05/08/2018 at Unknown time  . Dextromethorphan-guaiFENesin (MUCINEX DM) 30-600 MG TB12 Take 1 tablet by mouth every 12 (twelve) hours as needed for cough or congestion.  3 05/08/2018 at Unknown time  . diltiazem (CARDIZEM) 60 MG tablet Take 1 tablet (60 mg total) by mouth every 12 (twelve) hours. (Patient taking differently: Take 30 mg by mouth 2 (two) times daily. ) 60 tablet 0 05/08/2018 at Unknown time  . docusate sodium (COLACE) 100 MG capsule Take 100-200 mg by mouth daily as needed for constipation.  3 05/08/2018 at Unknown time  . feeding supplement, ENSURE ENLIVE, (ENSURE ENLIVE) LIQD Take 237 mLs by mouth 2 (two) times daily between meals. (Patient taking differently: Take 237 mLs by mouth See admin instructions. 237 mls --Three to four times per day.) 237 mL 12 05/08/2018 at Unknown time  . furosemide (LASIX) 40 MG tablet Take 1 tablet (40 mg total) by mouth 2 (two) times daily. (Patient taking differently: Take 40 mg by mouth daily. ) 30 tablet 0  05/08/2018 at Unknown time  . gabapentin (NEURONTIN) 300 MG capsule Take 1 capsule (300 mg total) by mouth 2 (two) times daily. 30 capsule 0 05/08/2018 at Unknown time  . lisinopril (PRINIVIL,ZESTRIL) 10 MG tablet Take 10 mg by mouth daily.  1 05/08/2018 at Unknown time  . LORazepam (ATIVAN) 1 MG tablet Take 1 tablet (1 mg total) by mouth every 8 (eight) hours. 30 tablet 0 05/08/2018 at Unknown time  . morphine (MS  CONTIN) 15 MG 12 hr tablet Take 15 mg by mouth every 8 (eight) hours.  0 05/08/2018 at Unknown time  . Oxycodone HCl 10 MG TABS Take 1 tablet (10 mg total) by mouth every 8 (eight) hours as needed. (Patient taking differently: Take 10-20 mg by mouth every 4 (four) hours as needed (pain). ) 90 tablet 0 05/08/2018 at Unknown time  . pantoprazole (PROTONIX) 40 MG tablet TAKE 1 TABLET BY MOUTH EVERY DAY (Patient taking differently: Take 40 mg by mouth daily. ) 30 tablet 6 05/08/2018 at Unknown time  . potassium chloride 20 MEQ TBCR Take 20 mEq by mouth 2 (two) times daily for 4 days. (Patient taking differently: Take 20 mEq by mouth daily. ) 8 tablet 0 05/08/2018 at Unknown time  . Probiotic Product (PROBIOTIC PO) Take 1 capsule by mouth daily.   05/08/2018 at Unknown time  . tamsulosin (FLOMAX) 0.4 MG CAPS capsule Take 1 capsule (0.4 mg total) by mouth daily after supper. (Patient taking differently: Take 0.4 mg by mouth as needed (urination). ) 30 capsule 3 05/08/2018 at Unknown time  . traMADol (ULTRAM) 50 MG tablet TAKE 1 TABLET BY MOUTH EVERY 6HRS AS NEEDED (Patient taking differently: Take 50 mg by mouth every 6 (six) hours as needed for moderate pain. ) 90 tablet 0 05/08/2018 at Unknown time  . zolpidem (AMBIEN) 10 MG tablet Take 1 tablet (10 mg total) by mouth at bedtime as needed for sleep. 30 tablet 0 05/08/2018 at Unknown time  . bisoprolol (ZEBETA) 10 MG tablet Take 1 tablet (10 mg total) by mouth daily. (Patient not taking: Reported on 05/09/2018) 30 tablet 0 Not Taking at Unknown  time  . budesonide-formoterol (SYMBICORT) 160-4.5 MCG/ACT inhaler Inhale 2 puffs into the lungs 2 (two) times daily. (Patient not taking: Reported on 03/03/2018) 3 Inhaler 3 Not Taking at Unknown time  . citalopram (CELEXA) 40 MG tablet Take 1 tablet (40 mg total) by mouth daily. (Patient not taking: Reported on 03/03/2018) 90 tablet 0 Not Taking at Unknown time  . colchicine 0.6 MG tablet Take 1 tablet (0.6 mg total) by mouth 2 (two) times daily as needed (gout flare ups). (Patient not taking: Reported on 05/09/2018) 60 tablet 6 Not Taking at Unknown time  . doxycycline (VIBRA-TABS) 100 MG tablet Take 1 tablet (100 mg total) by mouth every 12 (twelve) hours. (Patient not taking: Reported on 03/03/2018) 10 tablet 0 Not Taking at Unknown time  . Fluticasone-Umeclidin-Vilant (TRELEGY ELLIPTA) 100-62.5-25 MCG/INH AEPB Inhale 1 puff into the lungs daily. (Patient not taking: Reported on 05/09/2018) 28 each 5 Not Taking at Unknown time  . nicotine (NICODERM CQ - DOSED IN MG/24 HOURS) 14 mg/24hr patch Place 1 patch (14 mg total) onto the skin daily. (Patient not taking: Reported on 01/04/2018) 28 patch 0 Not Taking at Unknown time  . nystatin cream (MYCOSTATIN) Apply 1 application topically 2 (two) times daily. (Patient not taking: Reported on 03/03/2018) 30 g 1 Not Taking at Unknown time  . Oxycodone HCl 10 MG TABS Take 1-2 tablets (10-20 mg total) by mouth every 4 (four) hours as needed. (Patient not taking: Reported on 05/09/2018) 180 tablet 0 Not Taking at Unknown time  . potassium chloride SA (K-DUR,KLOR-CON) 20 MEQ tablet 1 tab po qd EVERY DAY (Patient not taking: Reported on 05/09/2018) 90 tablet 1 Not Taking at Unknown time  . prochlorperazine (COMPAZINE) 10 MG tablet Take 1 tablet (10 mg total) by mouth every 6 (six) hours as needed for nausea or  vomiting. (Patient not taking: Reported on 05/09/2018) 30 tablet 2 Not Taking at Unknown time  . urea (CARMOL) 40 % CREA Apply to right lower leg once daily  (Patient not taking: Reported on 03/03/2018) 1 each 6 Not Taking at Unknown time   Scheduled:  . budesonide (PULMICORT) nebulizer solution  0.5 mg Nebulization BID  . enalaprilat  0.625 mg Intravenous Q6H  . furosemide  40 mg Intravenous BID  . metoprolol tartrate  5 mg Intravenous Q6H   Infusions:  . heparin 1,450 Units/hr (05/09/18 2006)  . piperacillin-tazobactam 3.375 g (05/09/18 2141)  . [START ON 05/10/2018] vancomycin      Assessment: Scott Rollins was admitted today with resp distress. He was founded to have elevated troponin. Heparin was started in the ED but was supposed to be stopped due to the issue with his ischemic leg. A doppler was to be done and if neg, heparin was to be resume for the MI issue. However, they can't do the doppler today due to weekend. Hep was kept running. D/w the case with Dr. Olevia Bowens, we will keep the heparin going.   10/27 PM update: heparin level therapeutic x 1, considering cath once medically stabilized     Goal of Therapy:  Heparin level 0.3-0.7 units/ml Monitor platelets by anticoagulation protocol: Yes   Plan:  Continue heparin at 1450 units/hr Confirmatory heparin level with AM labs Monitor for bleeding  Narda Bonds, PharmD, BCPS Clinical Pharmacist Phone: [phone]

## 2018-05-09 NOTE — H&P (Addendum)
History and Physical    Scott Rollins VQQ:[id number] DOB: 09-Mar-1951 DOA: 05/09/2018  PCP: [name], MD   Patient coming from: Home.  I have personally briefly reviewed patient's old medical records in [place]  Chief Complaint: Shortness of breath.  HPI: Scott Rollins is a 67 y.o. 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  EMS gave the patient a continuous albuterol 10 + ipratropium 1 mg neb treatment and 125 mg of Solu-Medrol.  They placed the patient on CPAP/BiPAP.  No further history is available from the patient at this time.  ED Course: She will vital signs temperature 96.4 F, 7, respirations 11, blood pressure 180/94 mmHg and O2 sat 100% on room air.  The patient was continued on supplemental oxygen and BiPAP ventilation.  I started the patient on IV vancomycin and Zosyn for his lower extremity wound.  Furosemide 40 mg IVP x1 dose ordered.  Initial ABG showed a pH of 7.30, PCO2 of 72.6, PO2 of 104 mmHg.  Bicarbonate was 36.6, PCO2 was 39 mmol/L and base excess was 7.0.  O2 sat 98%.  White count 15.7 with 84% neutrophils, hemoglobin 14.8 g/dL and platelets 187.  PT/INR within normal limits.  Initial troponin 0.86 ng/mL.  BNP 704.7 pg/mL.  EKG was sinus rhythm with early repolarization.  His chest radiograph shows stable cardiomegaly, no focal consolidation.  CMP shows a chloride of 97 mmol/L.  BUN of 50,  creatinine 1.55 and glucose 134 mg/dL.  Magnesium and phosphorus were normal.  Review of Systems: Unable to obtain..   Past Medical History:  Diagnosis Date  . Anginal pain ([place])   . Anxiety   . Arthritis    "hips, knees, thoracic back" (01/22/2017)  . CAP (community acquired pneumonia) 11/2016  . 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 COPD (chronic obstructive pulmonary disease) ([place])    "pearl study" pt thinks he is on Symbicort; pt noncompliant with COPD controller meds on/off due to financial reasons.  . Coronary atherosclerosis of unspecified type of vessel, native or graft 2005   MI, s/p PCI with stent (pt reports 20+ interventions in the past, most recent cat 6/08 showed patent stents).  Normal LV function.  Nuclear stress test NEG 8/09.  . Depression    ?bipolar dx by psychiatrist?  . Gouty arthritis    Dr. Amil Amen  . Heart murmur   . Hematochezia 09/2012   Hyperplastic polyp, diverticulosis, and internal hemorrhoids found on colonoscopy 10/2012  . History of hiatal hernia   . Hyperkalemia 01/29/2017   Kayexalate 30 given x 1.  . Hyperlipidemia    Hx of multiple statin intolerance  . Hypertension   . 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 Metastasis from malignant neoplasm of prostate ([place])    "to spine & left hip; dx'd 01/21/2017"  . Migraine    "none in the 2000s" (01/15/2017)  . Mitral valve prolapse ~ 1981  . Neuropathic pain of both feet 2015/2016  Vit B12 and A1c normal 10/2014  . Obesities, morbid ([place])   . On home oxygen therapy    "2L prn" (01/15/2017)  . OSA on CPAP    w/oxygen (01/15/2017).  Noncompliance 2019.  Dr. Halford Chessman to repeat split night sleep study as of 12/2017 (not done as of 03/03/18)  . Osteoarthrosis, unspecified whether generalized or localized, unspecified site    DDD  . PAF (paroxysmal atrial fibrillation) ([place]) 12/2016   Started in the context of resp illness/lots of bronchodilators.  Started eliquis  and cardizem during hospitalization 01/2017 (Dr. Rayann Heman).  . Pre-diabetes 2016/17  . Prostate cancer metastatic to multiple sites [facility]) 09/2010; 2018   2012: Localized, high risk disease (Dr. Kimbrough/Grapey):  s/p 5 wks ext bm rad + seed boost, as well as hormone blockade x 1 yr.  Biochemical recurrence 11/2015;  2018 imaging showed bone mets and lymph node involvement.  Palliative Lupron and zytiga 2018/19--good PSA response as of 09/2017 onc f/u. **  To start hospice 03/17/18  . Radiation    Pelvic--for prostate ca: 25 treatments/01/2011  . Seizure disorder ([place]) 08-15-2011   Deja vu sensations: no w/u.  These stopped when neurontin 300mg  tid was started for a different reason.  . Status post chemotherapy    10/2010 thru 06/2011  . Tobacco dependence    still smoking as of fall 2018.    Past Surgical History:  Procedure Laterality Date  . CARDIAC CATHETERIZATION  23 caths  . COLONOSCOPY WITH PROPOFOL N/A 04/05/2013   Dr. Fuller Plan.  Polypectomy (hyperplastic--recall 10 yrs).  Moderate diverticulosis, +internal hemorrhoids.  No radiation proctitis.  . CORONARY ANGIOPLASTY    . CORONARY ANGIOPLASTY WITH STENT PLACEMENT     "5-6 stents" (01/15/2017)  . HERNIA REPAIR    . HOT HEMOSTASIS N/A 04/05/2013   Procedure: HOT HEMOSTASIS (ARGON PLASMA COAGULATION/BICAP);  Surgeon: Ladene Artist, MD;  Location: [facility];  Service: Endoscopy;  Laterality: N/A;  . INSERTION PROSTATE RADIATION SEED  02/24/2011  . TRANSTHORACIC ECHOCARDIOGRAM  12/2016   2018: EF 55-60%, moderate LVH, grd I DD, mild LA dilation.  11/17/17: LVEF of 55 to 70%, grade 1 diastolic dysfunction no wall motion abnormalities.  Marland Kitchen UMBILICAL HERNIA REPAIR       reports that he has quit smoking. His smoking use included cigarettes. He has a 82.50 pack-year smoking history. He has never used smokeless tobacco. He reports that he drank alcohol. He reports that he has current or past drug history. Drug: Marijuana.  Allergies  Allergen  Reactions  . Amitriptyline Hcl Nausea Only    REACTION: nausea, elevated bp  . Ezetimibe Other (See Comments)    REACTION: chest pain, fatigue    Family History  Problem Relation Age of Onset  . Arthritis Mother   . Heart disease Father   . Bipolar disorder Father     Prior to Admission medications   Medication Sig Start Date End Date Taking? Authorizing Provider  abiraterone acetate (ZYTIGA) 250 MG tablet Take 4 tablets (1,000 mg total) by mouth daily. Take on an empty stomach 1 hour before or 2 hours after a meal 01/27/18   Wyatt Portela, MD  albuterol (PROAIR HFA) 108 (90 Base) MCG/ACT inhaler Inhale 2 puffs into the lungs every 6 (six) hours as needed for wheezing or shortness of breath. 11/04/17   [name], [name], MD  albuterol (PROVENTIL) (2.5 MG/3ML) 0.083% nebulizer solution Take 3 mLs (2.5 mg total) by nebulization every 4 (four) hours as needed for wheezing  or shortness of breath. Patient not taking: Reported on 03/03/2018 12/02/17   [name], MD  allopurinol (ZYLOPRIM) 300 MG tablet Take 1 tablet (300 mg total) by mouth daily. 01/01/18   [name], [name], MD  apixaban (ELIQUIS) 5 MG TABS tablet Take 1 tablet (5 mg total) by mouth 2 (two) times daily. 09/28/17   [name], [name], MD  aspirin EC 81 MG EC tablet Take 1 tablet (81 mg total) by mouth daily. 12/27/17   Allie Bossier, MD  bisoprolol (ZEBETA) 10 MG tablet Take 1 tablet (10 mg total) by mouth daily. 01/07/18   [name], [name], MD  budesonide-formoterol (SYMBICORT) 160-4.5 MCG/ACT inhaler Inhale 2 puffs into the lungs 2 (two) times daily. Patient not taking: Reported on 03/03/2018 01/28/17   [name], MD  citalopram (CELEXA) 40 MG tablet Take 1 tablet (40 mg total) by mouth at bedtime. 11/19/17   Aline August, MD  citalopram (CELEXA) 40 MG tablet Take 1 tablet (40 mg total) by mouth daily. Patient not taking: Reported on 03/03/2018 02/18/18   [name], MD  colchicine 0.6 MG tablet Take 1 tablet  (0.6 mg total) by mouth 2 (two) times daily as needed (gout flare ups). 01/04/18   [name], [name], MD  diltiazem (CARDIZEM) 30 MG tablet Take 30 mg by mouth every 12 (twelve) hours. 02/24/18   [provider]  diltiazem (CARDIZEM) 60 MG tablet Take 1 tablet (60 mg total) by mouth every 12 (twelve) hours. Patient not taking: Reported on 03/03/2018 12/26/17   Allie Bossier, MD  doxycycline (VIBRA-TABS) 100 MG tablet Take 1 tablet (100 mg total) by mouth every 12 (twelve) hours. Patient not taking: Reported on 03/03/2018 12/26/17   Allie Bossier, MD  feeding supplement, ENSURE ENLIVE, (ENSURE ENLIVE) LIQD Take 237 mLs by mouth 2 (two) times daily between meals. Patient taking differently: Take 237 mLs by mouth See admin instructions. 237 mls --Three to four times per day. 01/17/17   Hosie Poisson, MD  Fluticasone-Umeclidin-Vilant (TRELEGY ELLIPTA) 100-62.5-25 MCG/INH AEPB Inhale 1 puff into the lungs daily. 12/29/17   Chesley Mires, MD  furosemide (LASIX) 40 MG tablet Take 1 tablet (40 mg total) by mouth 2 (two) times daily. 12/26/17   Allie Bossier, MD  gabapentin (NEURONTIN) 300 MG capsule Take 1 capsule (300 mg total) by mouth 2 (two) times daily. 03/03/18   Caccavale, Sophia, PA-C  lisinopril (PRINIVIL,ZESTRIL) 10 MG tablet Take 10 mg by mouth daily. 01/01/18   [provider]  LORazepam (ATIVAN) 1 MG tablet Take 1 tablet (1 mg total) by mouth every 8 (eight) hours. 03/11/18   Wyatt Portela, MD  nicotine (NICODERM CQ - DOSED IN MG/24 HOURS) 14 mg/24hr patch Place 1 patch (14 mg total) onto the skin daily. Patient not taking: Reported on 01/04/2018 12/27/17   Allie Bossier, MD  nystatin cream (MYCOSTATIN) Apply 1 application topically 2 (two) times daily. Patient not taking: Reported on 03/03/2018 01/11/18   [name], MD  Oxycodone HCl 10 MG TABS Take 1 tablet (10 mg total) by mouth every 8 (eight) hours as needed. Patient taking differently: Take 10-20 mg by mouth every 4  (four) hours as needed (pain).  01/07/18   Wyatt Portela, MD  Oxycodone HCl 10 MG TABS Take 1-2 tablets (10-20 mg total) by mouth every 4 (four) hours as needed. Patient not taking: Reported on 03/03/2018 03/01/18   Wyatt Portela, MD  pantoprazole (PROTONIX) 40 MG  tablet TAKE 1 TABLET BY MOUTH EVERY DAY 12/28/17   [name], [name], MD  potassium chloride 20 MEQ TBCR Take 20 mEq by mouth 2 (two) times daily for 4 days. 03/03/18 05/09/18  Caccavale, Sophia, PA-C  potassium chloride SA (K-DUR,KLOR-CON) 20 MEQ tablet 1 tab po qd EVERY DAY 01/05/18   [name], [name], MD  predniSONE (DELTASONE) 20 MG tablet Take 1 tablet (20 mg total) by mouth daily with supper. 12/26/17   Allie Bossier, MD  Probiotic Product (PROBIOTIC PO) Take 1 capsule by mouth daily.    [provider]  prochlorperazine (COMPAZINE) 10 MG tablet Take 1 tablet (10 mg total) by mouth every 6 (six) hours as needed for nausea or vomiting. 11/13/17   Wyatt Portela, MD  tamsulosin (FLOMAX) 0.4 MG CAPS capsule Take 1 capsule (0.4 mg total) by mouth daily after supper. Patient taking differently: Take 0.4 mg by mouth as needed (urination).  11/13/17   [name], [name], MD  traMADol (ULTRAM) 50 MG tablet TAKE 1 TABLET BY MOUTH EVERY 6HRS AS NEEDED Patient taking differently: Take 50 mg by mouth every 6 (six) hours as needed for moderate pain.  02/19/18   Wyatt Portela, MD  urea (CARMOL) 40 % CREA Apply to right lower leg once daily Patient not taking: Reported on 03/03/2018 12/02/17   [name], MD  zolpidem (AMBIEN) 10 MG tablet Take 1 tablet (10 mg total) by mouth at bedtime as needed for sleep. 04/06/18 05/06/18  Wyatt Portela, MD    Physical Exam: Vitals:   05/09/18 0645 05/09/18 0700 05/09/18 0727 05/09/18 0800  BP: (!) 173/99 (!) 164/94 (!) 163/93   Pulse: 67 66 65   Resp: 16 16 16    Temp:      TempSrc:      SpO2: 99% 99% 99%   Weight:    104 kg    Constitutional: NAD, calm, comfortable Eyes: PERRL, lids and  conjunctivae normal ENMT: BiPAP mask in place.  Mucous membranes are mildly dry. Neck: normal, supple, no masses, no thyromegaly, no JVD. Respiratory: Decreased breath sounds on bases with mild rhonchi, but currently no wheezing, no crackles. Normal respiratory effort. No accessory muscle use.  Cardiovascular: Regular rate and rhythm, faint systolic murmur, rubs / gallops.  As symmetric bilateral pitting edema 3+ right and 2+ on the left.. 2+ pedal pulse on the left.  Unable to palpate right pedal pulse. No carotid bruits.  Abdomen: Obese, soft, no tenderness, no masses palpated. No hepatosplenomegaly. Bowel sounds positive.  Musculoskeletal: no clubbing / cyanosis.  Good passive ROM, no contractures.  Decreased muscle tone.  Skin: Large open bulla on lower anterior pretibial area with surrounding erythema, edema, ecchymosis, which seems to be tender to palpation.  Large ecchymotic area of the medial aspect of the right foot.  Please see pictures below. Neurologic: Obtunded.  Psychiatric: Obtunded.  Responds to sternal rubbing.       Labs on Admission: I have personally reviewed following labs and imaging studies  CBC: Recent Labs  Lab 05/09/18 0440  WBC 15.7*  NEUTROABS 13.2*  HGB 14.8  HCT 48.7  MCV 92.2  PLT 654   Basic Metabolic Panel: Recent Labs  Lab 05/09/18 0440  NA 138  K 4.4  CL 97*  CO2 30  GLUCOSE 134*  BUN 50*  CREATININE 1.55*  CALCIUM 9.0  MG 2.1  PHOS 4.4   GFR: Estimated Creatinine Clearance: 55.5 mL/min (A) (by C-G formula based on SCr  of 1.55 mg/dL (H)). Liver Function Tests: No results for input(s): AST, ALT, ALKPHOS, BILITOT, PROT, ALBUMIN in the last 168 hours. No results for input(s): LIPASE, AMYLASE in the last 168 hours. No results for input(s): AMMONIA in the last 168 hours. Coagulation Profile: No results for input(s): INR, PROTIME in the last 168 hours. Cardiac Enzymes: No results for input(s): CKTOTAL, CKMB, CKMBINDEX, TROPONINI in the  last 168 hours. BNP (last 3 results) No results for input(s): PROBNP in the last 8760 hours. HbA1C: No results for input(s): HGBA1C in the last 72 hours. CBG: No results for input(s): GLUCAP in the last 168 hours. Lipid Profile: No results for input(s): CHOL, HDL, LDLCALC, TRIG, CHOLHDL, LDLDIRECT in the last 72 hours. Thyroid Function Tests: No results for input(s): TSH, T4TOTAL, FREET4, T3FREE, THYROIDAB in the last 72 hours. Anemia Panel: No results for input(s): VITAMINB12, FOLATE, FERRITIN, TIBC, IRON, RETICCTPCT in the last 72 hours. Urine analysis:    Component Value Date/Time   COLORURINE COLORLESS (A) 03/03/2018 1313   APPEARANCEUR CLEAR 03/03/2018 1313   LABSPEC 1.004 (L) 03/03/2018 1313   PHURINE 8.0 03/03/2018 1313   GLUCOSEU NEGATIVE 03/03/2018 1313   HGBUR NEGATIVE 03/03/2018 1313   HGBUR negative 08/21/2010 0831   BILIRUBINUR NEGATIVE 03/03/2018 1313   KETONESUR NEGATIVE 03/03/2018 1313   PROTEINUR NEGATIVE 03/03/2018 1313   UROBILINOGEN 0.2 08/21/2010 0831   NITRITE NEGATIVE 03/03/2018 1313   LEUKOCYTESUR NEGATIVE 03/03/2018 1313    Radiological Exams on Admission: Dg Chest Port 1 View  Result Date: 05/09/2018 CLINICAL DATA:  67 year old male with shortness of breath. EXAM: PORTABLE CHEST 1 VIEW COMPARISON:  Chest radiograph dated 03/03/2018 FINDINGS: Bibasilar interstitial coarsening/scarring similar to prior radiograph. No focal consolidation, pleural effusion, or pneumothorax. Stable cardiomegaly. No acute osseous pathology. IMPRESSION: No focal consolidation.  No significant interval change. Electronically Signed   By: Anner Crete M.D.   On: 05/09/2018 05:11   11/17/2017 echocardiogram complete ------------------------------------------------------------------- LV EF: 55% -   60%  ------------------------------------------------------------------- Indications:      Dyspnea  786.09.  ------------------------------------------------------------------- History:   PMH:  Metastatic Prostate cancer on Zytiga. Grade 3 Lower extremity edema.  ------------------------------------------------------------------- Study Conclusions  - Left ventricle: The cavity size was normal. Wall thickness was   increased in a pattern of mild LVH. Systolic function was normal.   The estimated ejection fraction was in the range of 55% to 60%.   Wall motion was normal; there were no regional wall motion   abnormalities. Doppler parameters are consistent with abnormal   left ventricular relaxation (grade 1 diastolic dysfunction). - Mitral valve: Calcified annulus. Mildly thickened leaflets . - Left atrium: The atrium was mildly dilated. - Right ventricle: The cavity size was mildly dilated. - Right atrium: The atrium was mildly dilated.  Impressions:  - Normal LV systolic function; mild diastolic dysfunction; mild   LVH; mild LAE, RAE and RVE.  EKG: Independently reviewed.  Vent. rate 76 BPM PR interval * ms QRS duration 103 ms QT/QTc 415/467 ms P-R-T axes 75 55 68 Sinus rhythm Abnormal R-wave progression, early transition  Assessment/Plan Principal Problem:   COPD with acute exacerbation (HCC)   Acute hypercapnic respiratory failure ([place]) Admit to stepdown/inpatient. Continue supplemental oxygen. Continue BiPAP mode ventilation. Continue bronchodilators. Pulmicort 1 mg via neb twice a day. Treat cellulitis and fluid overload.  Continue Active Problems:   ACS (acute coronary syndrome) (HCC)   Coronary artery disease due to lipid rich plaque On anticoagulation. Switch beta-blocker and ACE inhibitor to IV  for the moment. Furosemide for fluid overload. Trend troponin levels Check echocardiogram Cardiology to see.    Cellulitis of right lower extremity  vancomycin and Zosyn per pharmacy.    Essential hypertension On IV medications. Monitor BP, heart  rate, renal function electrolytes.    Mixed hyperlipidemia Resume statin once clear for oral intake.    OSA (obstructive sleep apnea) Continue BiPAP ventilation.    Atrial fibrillation ([place]) This is paroxysmal. > 64 yo, HTN, grade 1 diastolic dysfunction. CHA2DS2VASc = of at least 3. He also has a history of MAT as well. Currently on apixaban. Switch beta-blocker to IV. Resume calcium channel blocker once more alert for oral intake per    Prostate cancer metastatic to bone [facility]) This is at an incurable stage. He continues to progressively decline. He was seen by palliative care in June this year. Consider reconsulting palliative care     DVT prophylaxis: On apixaban.  Heparin by pharmacy ordered due to patient's obtundation. Code Status: On hospice at home.  Full code at the hospital. Family Communication: None at bedside. Disposition Plan: Admit for COPD exacerbation and NSTEMI treatment/further evaluation. Consults called: Cardiology was consulted by the EDP and will see the patient later today. Admission status: Inpatient/stepdown.   Reubin Milan MD [facility] Pager [phone].  If 7PM-7AM, please contact night-coverage [email or web address] Password [facility]  05/09/2018, 9:29 AM   This document was created with Viviann Spare

## 2018-05-09 NOTE — ED Provider Notes (Addendum)
[facility] EMERGENCY DEPARTMENT Provider Note   CSN: [id number] Arrival date & time: 05/09/18  0424     History   Chief Complaint Chief Complaint  Patient presents with  . Respiratory Distress    HPI Scott Rollins is a 67 y.o. male.  The history is provided by the patient and medical records.     67 year old male with history of anxiety, arthritis, COPD, coronary artery disease, depression, sleep apnea, seizure disorder, history of prostate cancer with bony mets not currently undergoing any treatment, presenting to the ED with respiratory distress.  Per EMS, patient has had worsening SOB almost all week but would not let family call EMS.  Tonight patient started having worsening symptoms, wife called EMS.  Upon their arrival patient was standing with his walker, tripoding and very diaphoretic with obvious labored breathing.  He was given Solu-Medrol, albuterol, and Atrovent with minimal improvement.  He was transition to CPAP and vastly improved in route.  He was started on BiPAP on arrival to the ED.  Patient is able to answer questions and follow commands but is somewhat somnolent.  He did take a dose of his home pain medication about 30 minutes prior to EMS arrival.  He denies any chest pain.  He is anticoagulated with Eliquis.  He has not had any noted falls or head trauma.  He is currently in hospice care but remains full code.  Past Medical History:  Diagnosis Date  . Anginal pain ([place])   . Anxiety   . Arthritis    "hips, knees, thoracic back" (01/22/2017)  . CAP (community acquired pneumonia) 11/2016  . 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 COPD (chronic obstructive pulmonary disease) ([place])    "pearl study" pt thinks he is on Symbicort; pt noncompliant with COPD controller meds on/off due to financial reasons.  . Coronary atherosclerosis of unspecified type of vessel, native or graft 2005   MI, s/p PCI with stent  (pt reports 20+ interventions in the past, most recent cat 6/08 showed patent stents).  Normal LV function.  Nuclear stress test NEG 8/09.  . Depression    ?bipolar dx by psychiatrist?  . Gouty arthritis    Dr. Amil Amen  . Heart murmur   . Hematochezia 09/2012   Hyperplastic polyp, diverticulosis, and internal hemorrhoids found on colonoscopy 10/2012  . History of hiatal hernia   . Hyperkalemia 01/29/2017   Kayexalate 30 given x 1.  . Hyperlipidemia    Hx of multiple statin intolerance  . Hypertension   . 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 Migraine    "none in the 2000s" (01/15/2017)  . Mitral valve prolapse ~ 1981  . Neuropathic pain of both feet 2015/2016   Vit B12 and A1c normal 10/2014  . Obesities, morbid ([place])   . On home oxygen therapy    "2L prn" (01/15/2017)  . OSA on CPAP    w/oxygen (01/15/2017).  Noncompliance 2019.  Dr. Halford Chessman to repeat split night sleep study as of 12/2017 (not done as of 03/03/18)  . Osteoarthrosis, unspecified whether generalized or localized, unspecified site    DDD  . PAF (paroxysmal atrial fibrillation) ([place]) 12/2016   Started in the context of resp illness/lots of bronchodilators.  Started eliquis and cardizem during hospitalization 01/2017 (Dr. Rayann Heman).  . Pre-diabetes 2016/17  .  Prostate cancer metastatic to multiple sites [facility]) 09/2010; 2018   2012: Localized, high risk disease (Dr. Kimbrough/Grapey):  s/p 5 wks ext bm rad + seed boost, as well as hormone blockade x 1 yr.  Biochemical recurrence 11/2015;  2018 imaging showed bone mets and lymph node involvement.  Palliative Lupron and zytiga 2018/19--good PSA response as of 09/2017 onc f/u. **  To start hospice 03/17/18  . Radiation    Pelvic--for prostate ca: 25 treatments/01/2011  . Seizure disorder ([place]) 08-15-2011   Deja vu sensations: no w/u.  These stopped when neurontin 300mg  tid was started  for a different reason.  . Status post chemotherapy    10/2010 thru 06/2011  . Tobacco dependence    still smoking as of fall 2018.    Patient Active Problem List   Diagnosis Date Noted  . Palliative care by specialist   . Acute pulmonary edema ([place])   . Acute on chronic respiratory failure with hypoxia and hypercapnia ([place]) 12/21/2017  . Acute exacerbation of CHF (congestive heart failure) ([place]) 12/21/2017  . Port-A-Cath in place 12/16/2017  . COPD exacerbation ([place]) 11/16/2017  . Acute hypercapnic respiratory failure ([place]) 11/16/2017  . Prostate cancer metastatic to bone ([place])   . Abnormal CT of the chest 01/21/2017  . Obesity hypoventilation syndrome ([place])   . Atrial fibrillation ([place]) 01/15/2017  . Community acquired pneumonia of right lower lobe of lung ([place]) 11/05/2016  . Hyponatremia 11/05/2016  . OSA (obstructive sleep apnea) 11/05/2016  . 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 COPD (chronic obstructive pulmonary disease) ([place]) 04/19/2014  . Chronic pain syndrome 01/11/2014  . Chronic gout 04/22/2013  . Goals of care, counseling/discussion 04/29/2012  . Mixed hyperlipidemia 08/27/2010  . ELEVATED PROSTATE SPECIFIC ANTIGEN 08/26/2010  . 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 HEMATURIA, HX OF 08/21/2010  . TOBACCO ABUSE 07/11/2010  . NUMMULAR ECZEMA 06/24/2010  . DYSPNEA 04/10/2009  . OBESITY 04/09/2009  . Essential hypertension 04/09/2009  . Coronary artery disease due to lipid rich plaque 04/09/2009  . DEGENERATIVE JOINT DISEASE 04/09/2009    Past Surgical History:  Procedure Laterality Date  . CARDIAC CATHETERIZATION  23 caths  . COLONOSCOPY WITH PROPOFOL N/A 04/05/2013   Dr. Fuller Plan.  Polypectomy (hyperplastic--recall 10 yrs).  Moderate diverticulosis, +internal hemorrhoids.  No radiation proctitis.  . CORONARY ANGIOPLASTY    . CORONARY ANGIOPLASTY WITH STENT PLACEMENT     "5-6 stents" (01/15/2017)  . HERNIA REPAIR    . HOT HEMOSTASIS N/A 04/05/2013   Procedure:  HOT HEMOSTASIS (ARGON PLASMA COAGULATION/BICAP);  Surgeon: Ladene Artist, MD;  Location: [facility];  Service: Endoscopy;  Laterality: N/A;  . INSERTION PROSTATE RADIATION SEED  02/24/2011  . TRANSTHORACIC ECHOCARDIOGRAM  12/2016   2018: EF 55-60%, moderate LVH, grd I DD, mild LA dilation.  11/17/17: LVEF of 55 to 25%, grade 1 diastolic dysfunction no wall motion abnormalities.  Marland Kitchen UMBILICAL HERNIA REPAIR          Home Medications    Prior to Admission medications   Medication Sig Start Date End Date Taking? Authorizing Provider  abiraterone acetate (ZYTIGA) 250 MG tablet Take 4 tablets (1,000 mg total) by mouth daily. Take on an empty stomach 1 hour before or 2 hours after a meal 01/27/18   Wyatt Portela, MD  albuterol (PROAIR HFA) 108 (90 Base) MCG/ACT inhaler Inhale 2 puffs into the lungs every 6 (six) hours as needed for wheezing or shortness of breath. 11/04/17   [name], [name], MD  albuterol (PROVENTIL) (  2.5 MG/3ML) 0.083% nebulizer solution Take 3 mLs (2.5 mg total) by nebulization every 4 (four) hours as needed for wheezing or shortness of breath. Patient not taking: Reported on 03/03/2018 12/02/17   [name], MD  allopurinol (ZYLOPRIM) 300 MG tablet Take 1 tablet (300 mg total) by mouth daily. 01/01/18   [name], [name], MD  apixaban (ELIQUIS) 5 MG TABS tablet Take 1 tablet (5 mg total) by mouth 2 (two) times daily. 09/28/17   [name], [name], MD  aspirin EC 81 MG EC tablet Take 1 tablet (81 mg total) by mouth daily. 12/27/17   Allie Bossier, MD  bisoprolol (ZEBETA) 10 MG tablet Take 1 tablet (10 mg total) by mouth daily. 01/07/18   [name], [name], MD  budesonide-formoterol (SYMBICORT) 160-4.5 MCG/ACT inhaler Inhale 2 puffs into the lungs 2 (two) times daily. Patient not taking: Reported on 03/03/2018 01/28/17   [name], MD  citalopram (CELEXA) 40 MG tablet Take 1 tablet (40 mg total) by mouth at bedtime. 11/19/17   Aline August, MD  citalopram (CELEXA) 40 MG  tablet Take 1 tablet (40 mg total) by mouth daily. Patient not taking: Reported on 03/03/2018 02/18/18   [name], MD  colchicine 0.6 MG tablet Take 1 tablet (0.6 mg total) by mouth 2 (two) times daily as needed (gout flare ups). 01/04/18   [name], [name], MD  diltiazem (CARDIZEM) 30 MG tablet Take 30 mg by mouth every 12 (twelve) hours. 02/24/18   [provider]  diltiazem (CARDIZEM) 60 MG tablet Take 1 tablet (60 mg total) by mouth every 12 (twelve) hours. Patient not taking: Reported on 03/03/2018 12/26/17   Allie Bossier, MD  doxycycline (VIBRA-TABS) 100 MG tablet Take 1 tablet (100 mg total) by mouth every 12 (twelve) hours. Patient not taking: Reported on 03/03/2018 12/26/17   Allie Bossier, MD  feeding supplement, ENSURE ENLIVE, (ENSURE ENLIVE) LIQD Take 237 mLs by mouth 2 (two) times daily between meals. Patient taking differently: Take 237 mLs by mouth See admin instructions. 237 mls --Three to four times per day. 01/17/17   Hosie Poisson, MD  Fluticasone-Umeclidin-Vilant (TRELEGY ELLIPTA) 100-62.5-25 MCG/INH AEPB Inhale 1 puff into the lungs daily. 12/29/17   Chesley Mires, MD  furosemide (LASIX) 40 MG tablet Take 1 tablet (40 mg total) by mouth 2 (two) times daily. 12/26/17   Allie Bossier, MD  gabapentin (NEURONTIN) 300 MG capsule Take 1 capsule (300 mg total) by mouth 2 (two) times daily. 03/03/18   Caccavale, Sophia, PA-C  lisinopril (PRINIVIL,ZESTRIL) 10 MG tablet Take 10 mg by mouth daily. 01/01/18   [provider]  LORazepam (ATIVAN) 1 MG tablet Take 1 tablet (1 mg total) by mouth every 8 (eight) hours. 03/11/18   Wyatt Portela, MD  nicotine (NICODERM CQ - DOSED IN MG/24 HOURS) 14 mg/24hr patch Place 1 patch (14 mg total) onto the skin daily. Patient not taking: Reported on 01/04/2018 12/27/17   Allie Bossier, MD  nystatin cream (MYCOSTATIN) Apply 1 application topically 2 (two) times daily. Patient not taking: Reported on 03/03/2018 01/11/18   [name], MD  Oxycodone HCl 10 MG TABS Take 1 tablet (10 mg total) by mouth every 8 (eight) hours as needed. Patient taking differently: Take 10-20 mg by mouth every 4 (four) hours as needed (pain).  01/07/18   Wyatt Portela, MD  Oxycodone HCl 10 MG TABS Take 1-2 tablets (10-20 mg total) by mouth every 4 (four)  hours as needed. Patient not taking: Reported on 03/03/2018 03/01/18   Wyatt Portela, MD  pantoprazole (PROTONIX) 40 MG tablet TAKE 1 TABLET BY MOUTH EVERY DAY 12/28/17   [name], [name], MD  potassium chloride 20 MEQ TBCR Take 20 mEq by mouth 2 (two) times daily for 4 days. 03/03/18 03/07/18  Caccavale, Sophia, PA-C  potassium chloride SA (K-DUR,KLOR-CON) 20 MEQ tablet 1 tab po qd EVERY DAY 01/05/18   [name], [name], MD  predniSONE (DELTASONE) 20 MG tablet Take 1 tablet (20 mg total) by mouth daily with supper. 12/26/17   Allie Bossier, MD  Probiotic Product (PROBIOTIC PO) Take 1 capsule by mouth daily.    [provider]  prochlorperazine (COMPAZINE) 10 MG tablet Take 1 tablet (10 mg total) by mouth every 6 (six) hours as needed for nausea or vomiting. 11/13/17   Wyatt Portela, MD  tamsulosin (FLOMAX) 0.4 MG CAPS capsule Take 1 capsule (0.4 mg total) by mouth daily after supper. Patient taking differently: Take 0.4 mg by mouth as needed (urination).  11/13/17   [name], [name], MD  traMADol (ULTRAM) 50 MG tablet TAKE 1 TABLET BY MOUTH EVERY 6HRS AS NEEDED Patient taking differently: Take 50 mg by mouth every 6 (six) hours as needed for moderate pain.  02/19/18   Wyatt Portela, MD  urea (CARMOL) 40 % CREA Apply to right lower leg once daily Patient not taking: Reported on 03/03/2018 12/02/17   [name], MD  zolpidem (AMBIEN) 10 MG tablet Take 1 tablet (10 mg total) by mouth at bedtime as needed for sleep. 04/06/18 05/06/18  Wyatt Portela, MD    Family History Family History  Problem Relation Age of Onset  . Arthritis Mother   . Heart disease Father   . Bipolar disorder  Father     Social History Social History   Tobacco Use  . Smoking status: Former Smoker    Packs/day: 1.50    Years: 55.00    Pack years: 82.50    Types: Cigarettes  . Smokeless tobacco: Never Used  . Tobacco comment: 2 weeks ago  Substance Use Topics  . Alcohol use: Not Currently    Comment: h/o heavy use - at least 6 daily, last use years ago  . Drug use: Yes    Types: Marijuana    Comment: 2-3 weeks ago last use     Allergies   Amitriptyline hcl and Ezetimibe   Review of Systems Review of Systems  Respiratory: Positive for shortness of breath.   All other systems reviewed and are negative.    Physical Exam Updated Vital Signs BP (!) 180/94   Pulse 76   Temp (!) 96.4 F (35.8 C) (Oral)   Resp 10   SpO2 100%   Physical Exam  Constitutional: He is oriented to person, place, and time. He appears well-developed and well-nourished.  Somnolent but arouses to verbal and tactile stimuli  HENT:  Head: Normocephalic and atraumatic.  Mouth/Throat: Oropharynx is clear and moist.  Eyes: Pupils are equal, round, and reactive to light. Conjunctivae and EOM are normal.  Neck: Normal range of motion.  Cardiovascular: Normal rate, regular rhythm and normal heart sounds.  Pulmonary/Chest: Effort normal and breath sounds normal.  Breath sounds diminished on bipap  Abdominal: Soft. Bowel sounds are normal. He exhibits no distension. There is no tenderness. There is no rebound.  Musculoskeletal: Normal range of motion.  2+ peripheral edema; erythema and bruising noted to right leg  Neurological: He is alert and oriented to person, place, and time.  Skin: Skin is warm and dry.  Psychiatric: He has a normal mood and affect.  Nursing note and vitals reviewed.    ED Treatments / Results  Labs (all labs ordered are listed, but only abnormal results are displayed) Labs Reviewed  CBC WITH DIFFERENTIAL/PLATELET - Abnormal; Notable for the following components:      Result  Value   WBC 15.7 (*)    Neutro Abs 13.2 (*)    Abs Immature Granulocytes 0.40 (*)    All other components within normal limits  BASIC METABOLIC PANEL - Abnormal; Notable for the following components:   Chloride 97 (*)    Glucose, Bld 134 (*)    BUN 50 (*)    Creatinine, Ser 1.55 (*)    GFR calc non Af Amer 44 (*)    GFR calc Af Amer 51 (*)    All other components within normal limits  I-STAT TROPONIN, ED - Abnormal; Notable for the following components:   Troponin i, poc 0.86 (*)    All other components within normal limits  I-STAT ARTERIAL BLOOD GAS, ED - Abnormal; Notable for the following components:   pH, Arterial 7.304 (*)    pCO2 arterial 72.6 (*)    pO2, Arterial 114.0 (*)    Bicarbonate 36.6 (*)    TCO2 39 (*)    Acid-Base Excess 7.0 (*)    All other components within normal limits  BRAIN NATRIURETIC PEPTIDE    EKG EKG Interpretation  Date/Time:  Sunday May 09 2018 04:30:12 EDT Ventricular Rate:  76 PR Interval:    QRS Duration: 103 QT Interval:  415 QTC Calculation: 467 R Axis:   55 Text Interpretation:  Sinus rhythm Abnormal R-wave progression, early transition mild ST depression inferiorly Reconfirmed by Malvin Johns [phone]) on 05/09/2018 5:27:25 AM   Radiology Dg Chest Port 1 View  Result Date: 05/09/2018 CLINICAL DATA:  67 year old male with shortness of breath. EXAM: PORTABLE CHEST 1 VIEW COMPARISON:  Chest radiograph dated 03/03/2018 FINDINGS: Bibasilar interstitial coarsening/scarring similar to prior radiograph. No focal consolidation, pleural effusion, or pneumothorax. Stable cardiomegaly. No acute osseous pathology. IMPRESSION: No focal consolidation.  No significant interval change. Electronically Signed   By: Anner Crete M.D.   On: 05/09/2018 05:11    Procedures Procedures (including critical care time)  CRITICAL CARE Performed by: Larene Pickett   Total critical care time: 45 minutes  Critical care time was exclusive of  separately billable procedures and treating other patients.  Critical care was necessary to treat or prevent imminent or life-threatening deterioration.  Critical care was time spent personally by me on the following activities: development of treatment plan with patient and/or surrogate as well as nursing, discussions with consultants, evaluation of patient's response to treatment, examination of patient, obtaining history from patient or surrogate, ordering and performing treatments and interventions, ordering and review of laboratory studies, ordering and review of radiographic studies, pulse oximetry and re-evaluation of patient's condition.   Medications Ordered in ED Medications  sodium chloride 0.9 % bolus 500 mL (has no administration in time range)  albuterol (PROVENTIL) (2.5 MG/3ML) 0.083% nebulizer solution 2.5 mg (has no administration in time range)     Initial Impression / Assessment and Plan / ED Course  I have reviewed the triage vital signs and the nursing notes.  Pertinent labs & imaging results that were available during my care of the patient were reviewed by me and  considered in my medical decision making (see chart for details).  67 y.o. M here with SOB.  Has been worsening over the past week, however patient would not allow family to call EMS.  They decided to call tonight when he got acutely worse.  He was distressed on EMS arrival per their report, did improve by time of arrival here.  He was transitioned to bipap, tolerating well.  He is somnolent but arousable to stimuli, able to answer questions when prompted.  Concern for hypercapnia-- ABG obtained which confirms with PCO2 of 72.  EKG with some inferior ST depressions which are new from prior.  Troponin mildly elevated at 0.86.  He is anti-coagulated with Eliquis so will not start heparin.  CXR clear.  Does have slight bump in SrCr and BUN, likely component of dehydration.  Given small fluid bolus considering CHF  history.  Will require admission.  Discussed with hospitalist, Dr. Myna Hidalgo-- patient will be admitted.  Also spoke with cardiology fellow-- they will consult given elevated troponin with EKG changes.  Final Clinical Impressions(s) / ED Diagnoses   Final diagnoses:  Acute respiratory failure with hypercapnia [facility])    ED Discharge Orders    None       Larene Pickett, PA-C 05/09/18 [place], PA-C 05/09/18 1829    Malvin Johns, MD 05/09/18 [phone]

## 2018-05-09 NOTE — Progress Notes (Signed)
Scott Rollins [id number] Admission Data: 05/09/2018 8:39 PM Attending Provider: Reubin Milan, MD  [id number], [name], MD Consults/ Treatment Team: Treatment Team:  Lbcardiology, [place], MD  Scott Rollins is a 67 y.o. male patient admitted from ED awake, alert  & orientated  X 3,  Full Code, VSS - Blood pressure (!) 155/88, pulse 67, temperature (!) 97.1 F (36.2 C), temperature source Oral, resp. rate 18, weight 104 kg, SpO2 99 %., O2    3 L nasal cannular, no c/o shortness of breath, no c/o chest pain, no distress noted. Tele # [id number] placed and pt is currently running:normal sinus rhythm.   IV site WDL:  hand left, condition patent and no redness with a transparent dsg that's clean dry and intact.  Allergies:   Allergies  Allergen Reactions  . Amitriptyline Hcl Nausea Only    REACTION: nausea, elevated bp  . Ezetimibe Other (See Comments)    REACTION: chest pain, fatigue     Past Medical History:  Diagnosis Date  . Anginal pain ([place])   . Anxiety   . Arthritis    "hips, knees, thoracic back" (01/22/2017)  . CAP (community acquired pneumonia) 11/2016  . 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 COPD (chronic obstructive pulmonary disease) ([place])    "pearl study" pt thinks he is on Symbicort; pt noncompliant with COPD controller meds on/off due to financial reasons.  . Coronary atherosclerosis of unspecified type of vessel, native or graft 2005   MI, s/p PCI with stent (pt reports 20+ interventions in the past, most recent cat 6/08 showed patent stents).  Normal LV function.  Nuclear stress test NEG 8/09.  . Depression    ?bipolar dx by psychiatrist?  . Gouty arthritis    Dr. Amil Amen  . Heart murmur   . Hematochezia 09/2012   Hyperplastic polyp, diverticulosis, and internal hemorrhoids found on colonoscopy 10/2012  . History of hiatal hernia   . Hyperkalemia 01/29/2017   Kayexalate 30 given x 1.  . Hyperlipidemia    Hx of  multiple statin intolerance  . Hypertension   . 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 Migraine    "none in the 2000s" (01/15/2017)  . Mitral valve prolapse ~ 1981  . Neuropathic pain of both feet 2015/2016   Vit B12 and A1c normal 10/2014  . Obesities, morbid ([place])   . On home oxygen therapy    "2L prn" (01/15/2017)  . OSA on CPAP    w/oxygen (01/15/2017).  Noncompliance 2019.  Dr. Halford Chessman to repeat split night sleep study as of 12/2017 (not done as of 03/03/18)  . Osteoarthrosis, unspecified whether generalized or localized, unspecified site    DDD  . PAF (paroxysmal atrial fibrillation) ([place]) 12/2016   Started in the context of resp illness/lots of bronchodilators.  Started eliquis and cardizem during hospitalization 01/2017 (Dr. Rayann Heman).  . Pre-diabetes 2016/17  . Prostate cancer metastatic to multiple sites [facility]) 09/2010; 2018   2012: Localized, high risk disease (Dr. Kimbrough/Grapey):  s/p 5 wks ext bm rad + seed boost, as well as hormone blockade x 1 yr.  Biochemical recurrence 11/2015;  2018 imaging showed bone mets and lymph node involvement.  Palliative Lupron and zytiga 2018/19--good PSA response as of 09/2017 onc f/u. **  To start hospice 03/17/18  . Radiation  Pelvic--for prostate ca: 25 treatments/01/2011  . Seizure disorder ([place]) 08-15-2011   Deja vu sensations: no w/u.  These stopped when neurontin 300mg  tid was started for a different reason.  . Status post chemotherapy    10/2010 thru 06/2011  . Tobacco dependence    still smoking as of fall 2018.    History:  obtained from chart review. Tobacco/alcohol: Smoked ?? packs per day for ?? years none  Pt orientation to unit, room and routine. Information packet given to patient/family and safety video watched.  Admission INP armband ID verified with patient/family, and in place. SR up x 2, fall risk assessment complete with  Patient and family verbalizing understanding of risks associated with falls. Pt verbalizes an understanding of how to use the call bell and to call for help before getting out of bed.  Skin, clean-dry- intact without evidence of bruising, or skin tears.   No evidence of skin break down noted on exam. hyperpigmentation -  abdomen, trunk, Cellulitis BLE with bursted bulla on RLE. Foam dressing applied to RLE and right lower fold of the buttock.     Will cont to monitor and assist as needed.  Salley Slaughter, RN 05/09/2018 8:39 PM

## 2018-05-09 NOTE — ED Notes (Signed)
Delay in lab draw,  Xray at bedside.

## 2018-05-09 NOTE — Consult Note (Addendum)
Cardiology Consultation:   Patient ID: Scott Rollins; [id number]; [birth year]   Admit date: 05/09/2018 Date of Consult: 05/09/2018  Primary Care Provider: [name], MD Primary Cardiologist: Dr. Wyatt Haste, MD  Patient Profile:   Scott Rollins is a 67 y.o. 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 Olevia Bowens.  History of Present Illness:   Scott Rollins is a 67 year old male with a history stated above who presented to [facility] on 05/09/2018 with complaints of respiratory distress.  Per EMS, patient noted to have had worsening shortness of breath for approximately 1 week however would not let family members called EMS for further evaluation.  On evening of 05/08/2018 he became acutely worse and wife called EMS for transport to the emergency department. Upon EMS presentation patient had diminished breath sounds and was placed on BiPAP secondary acute respiratory distress including tripoding and diaphoresis with obvious labored breathing, PCO2 found to be elevated with altered mentation. Patient was noted to be initially sleepy however improved with BiPAP ventilation. Upon interview today, he is more alert, but still fairly lethargic.  He reports a 2 to 3-week period of "mild" intermittent, exertional midsternal chest discomfort with associated diaphoresis and nausea which is relieved with rest. He has not sought medical attention for this. He reports worsening BLE, orthopnea symptoms and shortness of breath.  He denies dizziness, presyncopal or syncopal episodes.  He walks with a walker at baseline.  He reports no diet indiscretions and complete medication compliance however history is difficult to obtain secondary to lethargy and BiPAP placement.  There is no family at bedside.  In the ED, i-STAT troponin found to be  elevated at 0.86. EKG with NSR and no acute ischemic changes, however mild, diffuse ST abnormalities.  BNP mildly elevated at 704.  WBC elevated at 15.7.  CXR with no focal consolidation and no significant interval change.  Hospitalist asked to admit with cardiology consultation given elevated troponin and mild changes on EKG. initial BPs found to be elevated at 180/94.  He was started on IV antibiotics secondary to lower extremity wound and elevated WBC.  Of note, patient last seen by primary cardiologist 04/02/2017 for hypertension and CAD follow-up. Had episodes of MAT/PAF in the setting of pneumonia in 10/2016 and was evaluated by EP in 12/2016 thought to be a poor candidate for AAD therapy or [place] secondary to noncompliance. Several months later, had a recurrence of AF with RVR with mild respiratory stress seen by cardiology found to have episodes of MAT and PAF once again. He was then placed on [facility] therapy. Recommendations at that time were to treat his underlying lung disease and anticoagulate for stroke prevention.   Past Medical History:  Diagnosis Date  . Anginal pain ([place])   . Anxiety   . Arthritis    "hips, knees, thoracic back" (01/22/2017)  . CAP (community acquired pneumonia) 11/2016  . 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 COPD (chronic obstructive pulmonary disease) ([place])    "pearl study" pt thinks he is on Symbicort; pt noncompliant with COPD controller meds on/off due to financial reasons.  . Coronary atherosclerosis of unspecified type of vessel, native or graft 2005   MI, s/p PCI with stent (pt reports 20+ interventions in the past,  most recent cat 6/08 showed patent stents).  Normal LV function.  Nuclear stress test NEG 8/09.  . Depression    ?bipolar dx by psychiatrist?  . Gouty arthritis    Dr. Amil Amen  . Heart murmur   . Hematochezia 09/2012   Hyperplastic polyp, diverticulosis, and internal hemorrhoids found on colonoscopy 10/2012  .  History of hiatal hernia   . Hyperkalemia 01/29/2017   Kayexalate 30 given x 1.  . Hyperlipidemia    Hx of multiple statin intolerance  . Hypertension   . 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 Migraine    "none in the 2000s" (01/15/2017)  . Mitral valve prolapse ~ 1981  . Neuropathic pain of both feet 2015/2016   Vit B12 and A1c normal 10/2014  . Obesities, morbid ([place])   . On home oxygen therapy    "2L prn" (01/15/2017)  . OSA on CPAP    w/oxygen (01/15/2017).  Noncompliance 2019.  Dr. Halford Chessman to repeat split night sleep study as of 12/2017 (not done as of 03/03/18)  . Osteoarthrosis, unspecified whether generalized or localized, unspecified site    DDD  . PAF (paroxysmal atrial fibrillation) ([place]) 12/2016   Started in the context of resp illness/lots of bronchodilators.  Started eliquis and cardizem during hospitalization 01/2017 (Dr. Rayann Heman).  . Pre-diabetes 2016/17  . Prostate cancer metastatic to multiple sites [facility]) 09/2010; 2018   2012: Localized, high risk disease (Dr. Kimbrough/Grapey):  s/p 5 wks ext bm rad + seed boost, as well as hormone blockade x 1 yr.  Biochemical recurrence 11/2015;  2018 imaging showed bone mets and lymph node involvement.  Palliative Lupron and zytiga 2018/19--good PSA response as of 09/2017 onc f/u. **  To start hospice 03/17/18  . Radiation    Pelvic--for prostate ca: 25 treatments/01/2011  . Seizure disorder ([place]) 08-15-2011   Deja vu sensations: no w/u.  These stopped when neurontin 300mg  tid was started for a different reason.  . Status post chemotherapy    10/2010 thru 06/2011  . Tobacco dependence    still smoking as of fall 2018.    Past Surgical History:  Procedure Laterality Date  . CARDIAC CATHETERIZATION  23 caths  . COLONOSCOPY WITH PROPOFOL N/A 04/05/2013   Dr. Fuller Plan.  Polypectomy (hyperplastic--recall 10 yrs).  Moderate diverticulosis,  +internal hemorrhoids.  No radiation proctitis.  . CORONARY ANGIOPLASTY    . CORONARY ANGIOPLASTY WITH STENT PLACEMENT     "5-6 stents" (01/15/2017)  . HERNIA REPAIR    . HOT HEMOSTASIS N/A 04/05/2013   Procedure: HOT HEMOSTASIS (ARGON PLASMA COAGULATION/BICAP);  Surgeon: Ladene Artist, MD;  Location: [facility];  Service: Endoscopy;  Laterality: N/A;  . INSERTION PROSTATE RADIATION SEED  02/24/2011  . TRANSTHORACIC ECHOCARDIOGRAM  12/2016   2018: EF 55-60%, moderate LVH, grd I DD, mild LA dilation.  11/17/17: LVEF of 55 to 76%, grade 1 diastolic dysfunction no wall motion abnormalities.  Marland Kitchen UMBILICAL HERNIA REPAIR       Prior to Admission medications   Medication Sig Start Date End Date Taking? Authorizing Provider  abiraterone acetate (ZYTIGA) 250 MG tablet Take 4 tablets (1,000 mg total) by mouth daily. Take on an empty stomach 1 hour before or 2 hours after a meal 01/27/18   Wyatt Portela, MD  albuterol (PROAIR HFA) 108 (90 Base) MCG/ACT inhaler Inhale 2 puffs into the lungs  every 6 (six) hours as needed for wheezing or shortness of breath. 11/04/17   [name], [name], MD  albuterol (PROVENTIL) (2.5 MG/3ML) 0.083% nebulizer solution Take 3 mLs (2.5 mg total) by nebulization every 4 (four) hours as needed for wheezing or shortness of breath. Patient not taking: Reported on 03/03/2018 12/02/17   [name], MD  allopurinol (ZYLOPRIM) 300 MG tablet Take 1 tablet (300 mg total) by mouth daily. 01/01/18   [name], [name], MD  apixaban (ELIQUIS) 5 MG TABS tablet Take 1 tablet (5 mg total) by mouth 2 (two) times daily. 09/28/17   [name], [name], MD  aspirin EC 81 MG EC tablet Take 1 tablet (81 mg total) by mouth daily. 12/27/17   Allie Bossier, MD  bisoprolol (ZEBETA) 10 MG tablet Take 1 tablet (10 mg total) by mouth daily. 01/07/18   [name], [name], MD  budesonide-formoterol (SYMBICORT) 160-4.5 MCG/ACT inhaler Inhale 2 puffs into the lungs 2 (two) times daily. Patient not taking:  Reported on 03/03/2018 01/28/17   [name], MD  citalopram (CELEXA) 40 MG tablet Take 1 tablet (40 mg total) by mouth at bedtime. 11/19/17   Aline August, MD  citalopram (CELEXA) 40 MG tablet Take 1 tablet (40 mg total) by mouth daily. Patient not taking: Reported on 03/03/2018 02/18/18   [name], MD  colchicine 0.6 MG tablet Take 1 tablet (0.6 mg total) by mouth 2 (two) times daily as needed (gout flare ups). 01/04/18   [name], [name], MD  diltiazem (CARDIZEM) 30 MG tablet Take 30 mg by mouth every 12 (twelve) hours. 02/24/18   [provider]  diltiazem (CARDIZEM) 60 MG tablet Take 1 tablet (60 mg total) by mouth every 12 (twelve) hours. Patient not taking: Reported on 03/03/2018 12/26/17   Allie Bossier, MD  doxycycline (VIBRA-TABS) 100 MG tablet Take 1 tablet (100 mg total) by mouth every 12 (twelve) hours. Patient not taking: Reported on 03/03/2018 12/26/17   Allie Bossier, MD  feeding supplement, ENSURE ENLIVE, (ENSURE ENLIVE) LIQD Take 237 mLs by mouth 2 (two) times daily between meals. Patient taking differently: Take 237 mLs by mouth See admin instructions. 237 mls --Three to four times per day. 01/17/17   Hosie Poisson, MD  Fluticasone-Umeclidin-Vilant (TRELEGY ELLIPTA) 100-62.5-25 MCG/INH AEPB Inhale 1 puff into the lungs daily. 12/29/17   Chesley Mires, MD  furosemide (LASIX) 40 MG tablet Take 1 tablet (40 mg total) by mouth 2 (two) times daily. 12/26/17   Allie Bossier, MD  gabapentin (NEURONTIN) 300 MG capsule Take 1 capsule (300 mg total) by mouth 2 (two) times daily. 03/03/18   Caccavale, Sophia, PA-C  lisinopril (PRINIVIL,ZESTRIL) 10 MG tablet Take 10 mg by mouth daily. 01/01/18   [provider]  LORazepam (ATIVAN) 1 MG tablet Take 1 tablet (1 mg total) by mouth every 8 (eight) hours. 03/11/18   Wyatt Portela, MD  nicotine (NICODERM CQ - DOSED IN MG/24 HOURS) 14 mg/24hr patch Place 1 patch (14 mg total) onto the skin daily. Patient not taking: Reported  on 01/04/2018 12/27/17   Allie Bossier, MD  nystatin cream (MYCOSTATIN) Apply 1 application topically 2 (two) times daily. Patient not taking: Reported on 03/03/2018 01/11/18   [name], MD  Oxycodone HCl 10 MG TABS Take 1 tablet (10 mg total) by mouth every 8 (eight) hours as needed. Patient taking differently: Take 10-20 mg by mouth every 4 (four) hours as needed (pain).  01/07/18  Wyatt Portela, MD  Oxycodone HCl 10 MG TABS Take 1-2 tablets (10-20 mg total) by mouth every 4 (four) hours as needed. Patient not taking: Reported on 03/03/2018 03/01/18   Wyatt Portela, MD  pantoprazole (PROTONIX) 40 MG tablet TAKE 1 TABLET BY MOUTH EVERY DAY 12/28/17   [name], [name], MD  potassium chloride 20 MEQ TBCR Take 20 mEq by mouth 2 (two) times daily for 4 days. 03/03/18 05/09/18  Caccavale, Sophia, PA-C  potassium chloride SA (K-DUR,KLOR-CON) 20 MEQ tablet 1 tab po qd EVERY DAY 01/05/18   [name], [name], MD  predniSONE (DELTASONE) 20 MG tablet Take 1 tablet (20 mg total) by mouth daily with supper. 12/26/17   Allie Bossier, MD  Probiotic Product (PROBIOTIC PO) Take 1 capsule by mouth daily.    [provider]  prochlorperazine (COMPAZINE) 10 MG tablet Take 1 tablet (10 mg total) by mouth every 6 (six) hours as needed for nausea or vomiting. 11/13/17   Wyatt Portela, MD  tamsulosin (FLOMAX) 0.4 MG CAPS capsule Take 1 capsule (0.4 mg total) by mouth daily after supper. Patient taking differently: Take 0.4 mg by mouth as needed (urination).  11/13/17   [name], [name], MD  traMADol (ULTRAM) 50 MG tablet TAKE 1 TABLET BY MOUTH EVERY 6HRS AS NEEDED Patient taking differently: Take 50 mg by mouth every 6 (six) hours as needed for moderate pain.  02/19/18   Wyatt Portela, MD  urea (CARMOL) 40 % CREA Apply to right lower leg once daily Patient not taking: Reported on 03/03/2018 12/02/17   [name], MD  zolpidem (AMBIEN) 10 MG tablet Take 1 tablet (10 mg total) by mouth at bedtime as  needed for sleep. 04/06/18 05/06/18  Wyatt Portela, MD    Inpatient Medications: Scheduled Meds: . budesonide (PULMICORT) nebulizer solution  0.5 mg Nebulization BID   Continuous Infusions: . piperacillin-tazobactam    . vancomycin     PRN Meds: acetaminophen **OR** acetaminophen, albuterol, ondansetron **OR** ondansetron (ZOFRAN) IV  Allergies:    Allergies  Allergen Reactions  . Amitriptyline Hcl Nausea Only    REACTION: nausea, elevated bp  . Ezetimibe Other (See Comments)    REACTION: chest pain, fatigue    Social History:   Social History   Socioeconomic History  . Marital status: Married    Spouse name: [name]  . Number of children: Not on file  . Years of education: Not on file  . Highest education level: Not on file  Occupational History  . Occupation: Retired    Fish farm manager: [employer]  Social Needs  . Financial resource strain: Not on file  . Food insecurity:    Worry: Not on file    Inability: Not on file  . Transportation needs:    Medical: Not on file    Non-medical: Not on file  Tobacco Use  . Smoking status: Former Smoker    Packs/day: 1.50    Years: 55.00    Pack years: 82.50    Types: Cigarettes  . Smokeless tobacco: Never Used  . Tobacco comment: 2 weeks ago  Substance and Sexual Activity  . Alcohol use: Not Currently    Comment: h/o heavy use - at least 6 daily, last use years ago  . Drug use: Yes    Types: Marijuana    Comment: 2-3 weeks ago last use  . Sexual activity: Never  Lifestyle  . Physical activity:    Days per week: Not on  file    Minutes per session: Not on file  . Stress: Not on file  Relationships  . Social connections:    Talks on phone: Not on file    Gets together: Not on file    Attends religious service: Not on file    Active member of club or organization: Not on file    Attends meetings of clubs or organizations: Not on file    Relationship status: Not on file  . Intimate partner violence:    Fear of  current or ex partner: Not on file    Emotionally abused: Not on file    Physically abused: Not on file    Forced sexual activity: Not on file  Other Topics Concern  . Not on file  Social History Narrative   ** Merged History Encounter **   Lives in [place] but also enjoys spending time at [place] History:   Family History  Problem Relation Age of Onset  . Arthritis Mother   . Heart disease Father   . Bipolar disorder Father    Family Status:  Family Status  Relation Name Status  . Mother  (Not Specified)  . Father  (Not Specified)    ROS:  Please see the history of present illness.  All other ROS reviewed and negative.     Physical Exam/Data:   Vitals:   05/09/18 0700 05/09/18 0727 05/09/18 0800 05/09/18 0958  BP: (!) 164/94 (!) 163/93    Pulse: 66 65    Resp: 16 16    Temp:      TempSrc:      SpO2: 99% 99%  99%  Weight:   104 kg    No intake or output data in the 24 hours ending 05/09/18 1009 Filed Weights   05/09/18 0800  Weight: 104 kg   Body mass index is 32.43 kg/m.   General: Obese, NAD Skin: Right LE with ecchymosis and weeping wounds to lateral aspect>> bandage in place  Head: Normocephalic, atraumatic, clear, moist mucus membranes. Neck: Negative for carotid bruits. + JVD Lungs: Bilateral lower lobe crackles. No wheezes.  BiPAP in place Cardiovascular: RRR with S1 S2. No murmurs, rubs, gallops, or LV heave appreciated. Abdomen: Firm, non-tender, non-distended with normoactive bowel sounds.No obvious abdominal masses. MSK: Strength and tone appear normal for age. 5/5 in all extremities Extremities: 3+ B LE edema.  Right LE with dark ecchymosis and weeping wounds. DP/PT pulses 2+ bilaterally Neuro: Alert and oriented however, somnolent. No focal deficits. No facial asymmetry. MAE spontaneously. Psych: Responds to questions appropriately with normal affect.     EKG:  The EKG was personally reviewed and demonstrates: 05/09/2018  NSR with nonspecific ST-T abnormalities and no acute ischemic changes, similar to prior tracings  Telemetry:  Telemetry was personally reviewed and demonstrates: 05/09/2018 NSR with PACs  Relevant CV Studies:  ECHO: Echocardiogram 11/17/2017: Study Conclusions  - Left ventricle: The cavity size was normal. Wall thickness was   increased in a pattern of mild LVH. Systolic function was normal.   The estimated ejection fraction was in the range of 55% to 60%.   Wall motion was normal; there were no regional wall motion   abnormalities. Doppler parameters are consistent with abnormal   left ventricular relaxation (grade 1 diastolic dysfunction). - Mitral valve: Calcified annulus. Mildly thickened leaflets . - Left atrium: The atrium was mildly dilated. - Right ventricle: The cavity size was mildly dilated. - Right  atrium: The atrium was mildly dilated.  Impressions:  - Normal LV systolic function; mild diastolic dysfunction; mild   LVH; mild LAE, RAE and RVE.  CATH:  04/04/2005: CONCLUSION:  1.  Moderate left anterior descending artery, right coronary and circumflex      disease. No high-grade obstructions are noted in any of the proximal      major vascular territories. There is a significant distal disease in the      first diagonal. The distal left anterior descending artery and the      distal portions of the right circumflex.  2.  Normal left ventricular function with elevated left ventricular end-      diastolic pressure.  3.  No change in overall appearance of coronary anatomy since the prior cath      June 12, 2004.   PLAN:  Continue aggressive medical therapy. Encourage to lose weight. No  other changes other than increasing labetalol to 300 milligrams twice a day.  09/22/2000: IMPRESSION: 1. Normal left ventricular systolic function. 2. Two-vessel coronary artery disease as described with moderate disease    involving the main body of the second diagonal as  well as moderate disease    in the mid left circumflex and third obtuse marginal branch; in addition,    there is significant disease in the small branch vessels as described.    These images were reviewed with the findings on most recent catheterization    in September of last year.  The disease in the main body of the diagonal    and in the circumflex and obtuse marginal #3 is stable.  The disease in the    branch of the diagonal is stable.  The disease in the small fourth obtuse    marginal branch may have increased in severity slightly.  PLAN:  Patient will be treated medically for the time-being with adjustment of his anti-anginal medications.  We will perform an outpatient stress Cardiolite to rule out significant ischemia in the left circumflex distribution.  If this does show significant ischemia in this territory, we could consider percutaneous intervention to the left circumflex, although this would be relatively complex involving a fairly long area of vessel including a  Laboratory Data:  Chemistry Recent Labs  Lab 05/09/18 0440  NA 138  K 4.4  CL 97*  CO2 30  GLUCOSE 134*  BUN 50*  CREATININE 1.55*  CALCIUM 9.0  GFRNONAA 44*  GFRAA 51*  ANIONGAP 11    Total Protein  Date Value Ref Range Status  03/11/2018 6.6 6.5 - 8.1 g/dL Final  06/26/2017 7.0 6.4 - 8.3 g/dL Final   Albumin  Date Value Ref Range Status  03/11/2018 3.8 3.5 - 5.0 g/dL Final  06/26/2017 3.6 3.5 - 5.0 g/dL Final   AST  Date Value Ref Range Status  03/11/2018 14 (L) 15 - 41 U/L Final  06/26/2017 20 5 - 34 U/L Final   ALT  Date Value Ref Range Status  03/11/2018 12 0 - 44 U/L Final  06/26/2017 21 0 - 55 U/L Final   Alkaline Phosphatase  Date Value Ref Range Status  03/11/2018 76 38 - 126 U/L Final  06/26/2017 103 40 - 150 U/L Final   Total Bilirubin  Date Value Ref Range Status  03/11/2018 0.4 0.3 - 1.2 mg/dL Final  06/26/2017 0.47 0.20 - 1.20 mg/dL Final   Hematology Recent  Labs  Lab 05/09/18 0440  WBC 15.7*  RBC 5.28  HGB 14.8  HCT 48.7  MCV 92.2  MCH 28.0  MCHC 30.4  RDW 15.5  PLT 187   Cardiac EnzymesNo results for input(s): TROPONINI in the last 168 hours.  Recent Labs  Lab 05/09/18 0514  TROPIPOC 0.86*    BNP Recent Labs  Lab 05/09/18 0440  BNP 704.7*    DDimer No results for input(s): DDIMER in the last 168 hours. TSH:  Lab Results  Component Value Date   TSH 0.444 11/05/2016   Lipids: Lab Results  Component Value Date   CHOL 211 (H) 10/31/2016   HDL 37.30 (L) 10/31/2016   LDLCALC 146 (H) 10/31/2016   LDLDIRECT 130.7 12/02/2012   TRIG 137.0 10/31/2016   CHOLHDL 6 10/31/2016   HgbA1c: Lab Results  Component Value Date   HGBA1C 5.3 01/15/2017    Radiology/Studies:  Dg Chest Port 1 View  Result Date: 05/09/2018 CLINICAL DATA:  67 year old male with shortness of breath. EXAM: PORTABLE CHEST 1 VIEW COMPARISON:  Chest radiograph dated 03/03/2018 FINDINGS: Bibasilar interstitial coarsening/scarring similar to prior radiograph. No focal consolidation, pleural effusion, or pneumothorax. Stable cardiomegaly. No acute osseous pathology. IMPRESSION: No focal consolidation.  No significant interval change. Electronically Signed   By: Anner Crete M.D.   On: 05/09/2018 05:11   Assessment and Plan:   1.  Chest pain with elevated troponin and history of CAD with acute s/s of HF exacerbation: -Patient reports 2 to 3-week period of intermittent, exertional midsternal chest pain relieved with rest with associated diaphoresis and nausea.  He did not seek medical attention for this.  Additionally, he reports worsening shortness of breath, orthopnea symptoms and worsening BLE with 3+ pitting edema. -Troponin, elevated at 0.86 with EKG changes including mild ST segment deviations from prior tracings however, no acute ischemic changes -Last echocardiogram with LVEF of 55 to 60% with no wall motion abnormality and G1 DD however I am concerned  that his systolic function has significantly decreased to given his presentation -Continue to trend troponin>> no heparin till further discussion with MD secondary to LE discoloration/ecchymosis -If duplex negative>>will need Hep gtt for ACS  -Will repeat echocardiogram today -Will need further ischemic evaluation with cardiac catheterization once respiratory status has stabilized given elevated troponin with anginal symptoms, EKG changes and s/s of LV dysfunction -Continue IV Lasix 40 mg twice daily and monitor strict I&O, daily weight -Continue metoprolol IV secondary to BiPAP placement -Patient currently chest pain-free, on BiPAP ventilation  2.  Respiratory distress: -Patient presented secondary to acute respiratory distress which is been ongoing for the last 2 to 3 weeks however became acutely worse overnight.  EMS was called by patient's wife for transport to the ED for further evaluation -EMS report, patient was tripoding and diaphoretic upon presentation.  He was placed on BiPAP ventilation with improvement -Remains mildly somnolent upon interview -Per internal medicine  3.  Right lower extremity wound with discoloration/cellulitis: -Patient on anticoagulation with Eliquis>>LE vascular duplex ordered oer primary team for further evaluation.  -If negative, will need to be placed on Hep gtt per pharmacy -Right lower extremity with weeping open wounds likely the source of infection -Placed on IV antibiotics per pharmacy -Lower extremity vascular ultrasound arterial duplex ordered for today with pending results -Continue current regimen per internal medicine  4.  History of PAF and MAT: -No sustained atrial arrhythmias documented -Pt with NSR with PAC's per tele review  CHA2DS2VASc =2 with hx of noncompliance>>reports compliance   5.  Morbid obesity: -BMI, 38.5 -Weight loss advised  6.  HTN: -Elevated, 163/93, 164/94, 173/99 -Continue IV metoprolol for now, once able will  continue home medications  7. Ongoing tobacco use: -Cessation strongly encouraged   For questions or updates, please contact [place] Please consult [email or web address] for contact info under Cardiology/STEMI.   Lyndel Safe NP-C [place] Pager: [phone] 05/09/2018 10:09 AM   Attending Note:   The patient was seen and examined.  Agree with assessment and plan as noted above.  Changes made to the above note as needed.  Patient seen and independently examined with Karolee Stamps .   We discussed all aspects of the encounter. I agree with the assessment and plan as stated above.  1.  Respiratory failure: Patient presents with respiratory failure. Arterial blood gas reveals a pH of 7.30/PCO2 of 72.6/PO2 of 114 Patient has altered mental status. Was just instructed by the pharmacist that the patient has not been taking his Eliquis.  She called the pharmacy and he has not refilled Eliquis in quite some time. Its possible that he is had a DVT and pulmonary embolus.  2.  Altered mental status: The patient has altered mental status.  He may have had a stroke since he is not on his Eliquis. Will need to be evaluated further by the hospital team.  3.  Elevated troponin level: This is most likely from his severe congestive heart failure.  Upon levels are not high enough to suggest that this is a nearly an acute coronary syndrome  He needs to be stabilized medically and we can consider heart catheterization-assuming that his mental status improves.     I have spent a total of 40 minutes with patient reviewing hospital  notes , telemetry, EKGs, labs and examining patient as well as establishing an assessment and plan that was discussed with the patient. > 50% of time was spent in direct patient care.    Thayer Headings, Brooke Bonito., MD, [facility] 05/09/2018, 1:17 PM [address]  [place] Pager [phone]

## 2018-05-09 NOTE — ED Triage Notes (Signed)
Pt comes via [facility] for resp distress, has had increased SOB for the past week, hx of bone cancer and COPD. SOB is unrelieved by home nebs, pt has new redness and swelling to the R lower extremity. Pt is a hospice pt. PTA 125 solumedrol, 1 atrovent, 10 albuterol. Tolerating bipap.

## 2018-05-09 NOTE — ED Notes (Signed)
Pharmacy calle d-- orders to stop heparin-

## 2018-05-09 NOTE — Progress Notes (Signed)
ANTICOAGULATION CONSULT NOTE - Initial Consult  Pharmacy Consult for heparin Indication: chest pain/ACS  Allergies  Allergen Reactions  . Amitriptyline Hcl Nausea Only    REACTION: nausea, elevated bp  . Ezetimibe Other (See Comments)    REACTION: chest pain, fatigue    Patient Measurements: Weight: 229 lb 4.5 oz (104 kg)(Per RN ) Heparin Dosing Weight: 97kg  Vital Signs: BP: 150/93 (10/27 1800) Pulse Rate: 71 (10/27 1800)  Labs: Recent Labs    05/09/18 0440 05/09/18 1005 05/09/18 1136  HGB 14.8  --   --   HCT 48.7  --   --   PLT 187  --   --   APTT  --   --  28  LABPROT  --  SPECIMEN CLOTTED 12.3  INR  --  SPECIMEN CLOTTED 0.92  HEPARINUNFRC  --   --  <0.10*  CREATININE 1.55*  --   --   TROPONINI  --   --  0.93*    Estimated Creatinine Clearance: 55.5 mL/min (A) (by C-G formula based on SCr of 1.55 mg/dL (H)).   Medical History: Past Medical History:  Diagnosis Date  . Anginal pain ([place])   . Anxiety   . Arthritis    "hips, knees, thoracic back" (01/22/2017)  . CAP (community acquired pneumonia) 11/2016  . 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 COPD (chronic obstructive pulmonary disease) ([place])    "pearl study" pt thinks he is on Symbicort; pt noncompliant with COPD controller meds on/off due to financial reasons.  . Coronary atherosclerosis of unspecified type of vessel, native or graft 2005   MI, s/p PCI with stent (pt reports 20+ interventions in the past, most recent cat 6/08 showed patent stents).  Normal LV function.  Nuclear stress test NEG 8/09.  . Depression    ?bipolar dx by psychiatrist?  . Gouty arthritis    Dr. Amil Amen  . Heart murmur   . Hematochezia 09/2012   Hyperplastic polyp, diverticulosis, and internal hemorrhoids found on colonoscopy 10/2012  . History of hiatal hernia   . Hyperkalemia 01/29/2017   Kayexalate 30 given x 1.  . Hyperlipidemia    Hx of multiple statin intolerance  . Hypertension    . 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 Migraine    "none in the 2000s" (01/15/2017)  . Mitral valve prolapse ~ 1981  . Neuropathic pain of both feet 2015/2016   Vit B12 and A1c normal 10/2014  . Obesities, morbid ([place])   . On home oxygen therapy    "2L prn" (01/15/2017)  . OSA on CPAP    w/oxygen (01/15/2017).  Noncompliance 2019.  Dr. Halford Chessman to repeat split night sleep study as of 12/2017 (not done as of 03/03/18)  . Osteoarthrosis, unspecified whether generalized or localized, unspecified site    DDD  . PAF (paroxysmal atrial fibrillation) ([place]) 12/2016   Started in the context of resp illness/lots of bronchodilators.  Started eliquis and cardizem during hospitalization 01/2017 (Dr. Rayann Heman).  . Pre-diabetes 2016/17  . Prostate cancer metastatic to multiple sites [facility]) 09/2010; 2018   2012: Localized, high risk disease (Dr. Kimbrough/Grapey):  s/p 5 wks ext bm rad + seed boost, as well as hormone blockade x 1 yr.  Biochemical recurrence 11/2015;  2018 imaging showed bone mets and lymph node involvement.  Palliative Lupron and  zytiga 2018/19--good PSA response as of 09/2017 onc f/u. **  To start hospice 03/17/18  . Radiation    Pelvic--for prostate ca: 25 treatments/01/2011  . Seizure disorder ([place]) 08-15-2011   Deja vu sensations: no w/u.  These stopped when neurontin 300mg  tid was started for a different reason.  . Status post chemotherapy    10/2010 thru 06/2011  . Tobacco dependence    still smoking as of fall 2018.    Medications:   (Not in a hospital admission) Scheduled:  . budesonide (PULMICORT) nebulizer solution  0.5 mg Nebulization BID  . enalaprilat  0.625 mg Intravenous Q6H  . furosemide  40 mg Intravenous BID  . metoprolol tartrate  5 mg Intravenous Q6H   Infusions:  . heparin 1,450 Units/hr (05/09/18 1510)  . piperacillin-tazobactam Stopped (05/09/18 1316)  . [START  ON 05/10/2018] vancomycin      Assessment: Valgene was admitted today with resp distress. He was founded to have elevated troponin. Heparin was started in the ED but was supposed to be stopped due to the issue with his ischemic leg. A doppler was to be done and if neg, heparin was to be resume for the MI issue. However, they can't do the doppler today due to weekend. Hep was kept running. D/w the case with Dr. Olevia Bowens, we will keep the heparin going.     Goal of Therapy:  Heparin level 0.3-0.7 units/ml Monitor platelets by anticoagulation protocol: Yes   Plan:  Continue heparin at 1450 units/hr F/u with heparin level around [place], PharmD, [place], AAHIVP, CPP Infectious Disease Pharmacist Pager: [phone] 05/09/2018 7:00 PM

## 2018-05-09 NOTE — Progress Notes (Addendum)
Pharmacy Antibiotic Note - Adding Heparin for r/o MI  Stat: aPTT level = 28 sec - doubt he has been taking Apixaban - no fill history from drug store.  Plan:  Heparin 4,000 units IV bolus x 1           Heparin infusion at 1450 units/hr           Check 8 hour HL and adjust  Scott Rollins is a 67 y.o. male admitted on 05/09/2018 with cellulitis and resp. distress.  Pharmacy has been consulted for Vancomycin dosing.  He has also been ordered to received IV Zosyn.  He is admitted with diminished breath sounds and is on BiPAP.  He has some renal impairment with creat of 1.55 and an estimated crcl of 74ml/min.  His WBC is 15.7 and he is somewhat hypertensive, has a slight elevation in troponin and BNP.  Plan: Vancomycin 2gm load then begin maintenance regimen of 1500 mg every 24hr. F/U for micro data/sensitivities if ordered F/U renal fxn. and Vanc. levels as needed. Monitor for clinical response.  Weight: 229 lb 4.5 oz (104 kg)(Per RN )  Temp (24hrs), Avg:96.4 F (35.8 C), Min:96.4 F (35.8 C), Max:96.4 F (35.8 C)  Recent Labs  Lab 05/09/18 0440  WBC 15.7*  CREATININE 1.55*    Estimated Creatinine Clearance: 55.5 mL/min (A) (by C-G formula based on SCr of 1.55 mg/dL (H)).    Allergies  Allergen Reactions  . Amitriptyline Hcl Nausea Only    REACTION: nausea, elevated bp  . Ezetimibe Other (See Comments)    REACTION: chest pain, fatigue    Antimicrobials this admission: 10/27 Vanc >> 10/27 Pip/Tazo >>  Rober Minion, PharmD., MS Clinical Pharmacist Pager:  [phone] Thank you for allowing pharmacy to be part of this patients care team. 05/09/2018 8:26 AM

## 2018-05-09 NOTE — ED Notes (Signed)
Hospice Nurse from [place], RN in to see pt.

## 2018-05-10 ENCOUNTER — Inpatient Hospital Stay (HOSPITAL_COMMUNITY)

## 2018-05-10 DIAGNOSIS — I214 Non-ST elevation (NSTEMI) myocardial infarction: Secondary | ICD-10-CM

## 2018-05-10 DIAGNOSIS — J9602 Acute respiratory failure with hypercapnia: Secondary | ICD-10-CM

## 2018-05-10 DIAGNOSIS — L0390 Cellulitis, unspecified: Secondary | ICD-10-CM

## 2018-05-10 DIAGNOSIS — I10 Essential (primary) hypertension: Secondary | ICD-10-CM

## 2018-05-10 DIAGNOSIS — I480 Paroxysmal atrial fibrillation: Secondary | ICD-10-CM

## 2018-05-10 DIAGNOSIS — I249 Acute ischemic heart disease, unspecified: Secondary | ICD-10-CM

## 2018-05-10 DIAGNOSIS — J441 Chronic obstructive pulmonary disease with (acute) exacerbation: Secondary | ICD-10-CM

## 2018-05-10 DIAGNOSIS — R609 Edema, unspecified: Secondary | ICD-10-CM

## 2018-05-10 LAB — HEPARIN LEVEL (UNFRACTIONATED)
HEPARIN UNFRACTIONATED: 0.66 [IU]/mL (ref 0.30–0.70)
Heparin Unfractionated: 0.71 IU/mL — ABNORMAL HIGH (ref 0.30–0.70)
Heparin Unfractionated: 0.9 IU/mL — ABNORMAL HIGH (ref 0.30–0.70)

## 2018-05-10 LAB — COMPREHENSIVE METABOLIC PANEL
ALK PHOS: 48 U/L (ref 38–126)
ALT: 33 U/L (ref 0–44)
AST: 30 U/L (ref 15–41)
Albumin: 2.7 g/dL — ABNORMAL LOW (ref 3.5–5.0)
Anion gap: 11 (ref 5–15)
BUN: 55 mg/dL — AB (ref 8–23)
CALCIUM: 8.8 mg/dL — AB (ref 8.9–10.3)
CO2: 33 mmol/L — AB (ref 22–32)
CREATININE: 1.23 mg/dL (ref 0.61–1.24)
Chloride: 95 mmol/L — ABNORMAL LOW (ref 98–111)
GFR calc non Af Amer: 59 mL/min — ABNORMAL LOW (ref >60)
GLUCOSE: 125 mg/dL — AB (ref 70–99)
Potassium: 4.1 mmol/L (ref 3.5–5.1)
SODIUM: 139 mmol/L (ref 135–145)
Total Bilirubin: 0.7 mg/dL (ref 0.3–1.2)
Total Protein: 5.3 g/dL — ABNORMAL LOW (ref 6.5–8.1)

## 2018-05-10 LAB — CBC WITH DIFFERENTIAL/PLATELET
ABS IMMATURE GRANULOCYTES: 0.39 10*3/uL — AB (ref 0.00–0.07)
Basophils Absolute: 0 10*3/uL (ref 0.0–0.1)
Basophils Relative: 0 %
Eosinophils Absolute: 0 10*3/uL (ref 0.0–0.5)
Eosinophils Relative: 0 %
HCT: 45.6 % (ref 39.0–52.0)
HEMOGLOBIN: 14.4 g/dL (ref 13.0–17.0)
Immature Granulocytes: 3 %
LYMPHS PCT: 7 %
Lymphs Abs: 1 10*3/uL (ref 0.7–4.0)
MCH: 28.5 pg (ref 26.0–34.0)
MCHC: 31.6 g/dL (ref 30.0–36.0)
MCV: 90.1 fL (ref 80.0–100.0)
MONO ABS: 0.8 10*3/uL (ref 0.1–1.0)
Monocytes Relative: 5 %
NEUTROS ABS: 12.9 10*3/uL — AB (ref 1.7–7.7)
Neutrophils Relative %: 85 %
Platelets: 185 10*3/uL (ref 150–400)
RBC: 5.06 MIL/uL (ref 4.22–5.81)
RDW: 15.4 % (ref 11.5–15.5)
WBC: 15.1 10*3/uL — AB (ref 4.0–10.5)
nRBC: 0 % (ref 0.0–0.2)

## 2018-05-10 LAB — ECHOCARDIOGRAM COMPLETE: Weight: 3763.69 oz

## 2018-05-10 LAB — TROPONIN I: TROPONIN I: 1.28 ng/mL — AB (ref <0.03)

## 2018-05-10 LAB — MAGNESIUM: MAGNESIUM: 2.2 mg/dL (ref 1.7–2.4)

## 2018-05-10 MED ORDER — FUROSEMIDE 10 MG/ML IJ SOLN
60.0000 mg | Freq: Three times a day (TID) | INTRAMUSCULAR | Status: DC
  Administered 2018-05-10: 60 mg via INTRAVENOUS
  Filled 2018-05-10: qty 6

## 2018-05-10 MED ORDER — OXYCODONE HCL 10 MG PO TABS: 10.0000 mg | ORAL_TABLET | Freq: Four times a day (QID) | ORAL | Status: DC | PRN

## 2018-05-10 MED ORDER — FUROSEMIDE 40 MG PO TABS
40.0000 mg | ORAL_TABLET | Freq: Two times a day (BID) | ORAL | Status: DC
  Administered 2018-05-10: 40 mg via ORAL
  Filled 2018-05-10: qty 1

## 2018-05-10 MED ORDER — POTASSIUM CHLORIDE CRYS ER 20 MEQ PO TBCR
40.0000 meq | EXTENDED_RELEASE_TABLET | Freq: Once | ORAL | Status: AC
  Administered 2018-05-10: 40 meq via ORAL
  Filled 2018-05-10: qty 2

## 2018-05-10 MED ORDER — INFLUENZA VAC SPLIT HIGH-DOSE 0.5 ML IM SUSY
0.5000 mL | PREFILLED_SYRINGE | INTRAMUSCULAR | Status: AC
  Administered 2018-05-11: 0.5 mL via INTRAMUSCULAR
  Filled 2018-05-10: qty 0.5

## 2018-05-10 MED ORDER — ASPIRIN EC 81 MG PO TBEC
81.0000 mg | DELAYED_RELEASE_TABLET | Freq: Every day | ORAL | Status: DC
  Administered 2018-05-10 – 2018-05-12 (×3): 81 mg via ORAL
  Filled 2018-05-10 (×3): qty 1

## 2018-05-10 MED ORDER — COLCHICINE 0.6 MG PO TABS: 0.6000 mg | ORAL_TABLET | Freq: Two times a day (BID) | ORAL | Status: DC | PRN

## 2018-05-10 MED ORDER — MORPHINE SULFATE ER 15 MG PO TBCR: 15.0000 mg | EXTENDED_RELEASE_TABLET | Freq: Three times a day (TID) | ORAL | Status: DC

## 2018-05-10 MED ORDER — DOCUSATE SODIUM 100 MG PO CAPS
100.0000 mg | ORAL_CAPSULE | Freq: Two times a day (BID) | ORAL | Status: DC
  Administered 2018-05-10 – 2018-05-12 (×4): 100 mg via ORAL
  Filled 2018-05-10 (×5): qty 1

## 2018-05-10 MED ORDER — DOXYCYCLINE HYCLATE 100 MG PO TABS
100.0000 mg | ORAL_TABLET | Freq: Two times a day (BID) | ORAL | Status: DC
  Administered 2018-05-10 – 2018-05-12 (×5): 100 mg via ORAL
  Filled 2018-05-10 (×5): qty 1

## 2018-05-10 MED ORDER — ALLOPURINOL 300 MG PO TABS
300.0000 mg | ORAL_TABLET | Freq: Every day | ORAL | Status: DC
  Administered 2018-05-10 – 2018-05-12 (×3): 300 mg via ORAL
  Filled 2018-05-10 (×3): qty 1

## 2018-05-10 MED ORDER — NITROGLYCERIN 2 % TD OINT
0.5000 [in_us] | TOPICAL_OINTMENT | Freq: Four times a day (QID) | TRANSDERMAL | Status: DC
  Administered 2018-05-11 – 2018-05-12 (×4): 0.5 [in_us] via TOPICAL
  Filled 2018-05-10: qty 30

## 2018-05-10 MED ORDER — GABAPENTIN 300 MG PO CAPS
300.0000 mg | ORAL_CAPSULE | Freq: Two times a day (BID) | ORAL | Status: DC
  Administered 2018-05-10 – 2018-05-12 (×5): 300 mg via ORAL
  Filled 2018-05-10 (×6): qty 1

## 2018-05-10 MED ORDER — OXYCODONE HCL 5 MG PO TABS
10.0000 mg | ORAL_TABLET | ORAL | Status: DC | PRN
  Administered 2018-05-10 (×3): 10 mg via ORAL
  Filled 2018-05-10 (×3): qty 2

## 2018-05-10 MED ORDER — TAMSULOSIN HCL 0.4 MG PO CAPS: 0.4000 mg | ORAL_CAPSULE | Freq: Every day | ORAL | Status: DC | PRN

## 2018-05-10 MED ORDER — CITALOPRAM HYDROBROMIDE 40 MG PO TABS
40.0000 mg | ORAL_TABLET | Freq: Every day | ORAL | Status: DC
  Administered 2018-05-10 – 2018-05-12 (×3): 40 mg via ORAL
  Filled 2018-05-10 (×3): qty 1

## 2018-05-10 MED ORDER — BISOPROLOL FUMARATE 5 MG PO TABS
5.0000 mg | ORAL_TABLET | Freq: Every day | ORAL | Status: DC
  Administered 2018-05-10 – 2018-05-12 (×3): 5 mg via ORAL
  Filled 2018-05-10 (×3): qty 1

## 2018-05-10 MED ORDER — LORAZEPAM 1 MG PO TABS: 1.0000 mg | ORAL_TABLET | Freq: Three times a day (TID) | ORAL | Status: DC | PRN

## 2018-05-10 MED ORDER — DEXAMETHASONE 4 MG PO TABS
12.0000 mg | ORAL_TABLET | Freq: Every day | ORAL | Status: DC
  Administered 2018-05-10 – 2018-05-12 (×3): 12 mg via ORAL
  Filled 2018-05-10 (×3): qty 3

## 2018-05-10 MED ORDER — PANTOPRAZOLE SODIUM 40 MG PO TBEC
40.0000 mg | DELAYED_RELEASE_TABLET | Freq: Every day | ORAL | Status: DC
  Administered 2018-05-10 – 2018-05-12 (×3): 40 mg via ORAL
  Filled 2018-05-10 (×3): qty 1

## 2018-05-10 MED ORDER — PERFLUTREN LIPID MICROSPHERE
1.0000 mL | INTRAVENOUS | Status: AC | PRN
  Administered 2018-05-10: 2 mL via INTRAVENOUS
  Filled 2018-05-10 (×2): qty 10

## 2018-05-10 MED ORDER — ENSURE ENLIVE PO LIQD
237.0000 mL | Freq: Two times a day (BID) | ORAL | Status: DC
  Administered 2018-05-10 – 2018-05-12 (×4): 237 mL via ORAL

## 2018-05-10 MED ORDER — MORPHINE SULFATE ER 15 MG PO TBCR
15.0000 mg | EXTENDED_RELEASE_TABLET | Freq: Three times a day (TID) | ORAL | Status: DC
  Administered 2018-05-10 – 2018-05-12 (×7): 15 mg via ORAL
  Filled 2018-05-10 (×7): qty 1

## 2018-05-10 NOTE — Progress Notes (Signed)
  Echocardiogram 2D Echocardiogram has been performed.  Scott Rollins 05/10/2018, 12:38 PM

## 2018-05-10 NOTE — Progress Notes (Signed)
PROGRESS NOTE                                                                                                                                                                                                             Patient Demographics:    Scott Rollins, is a 67 y.o. male, DOB - Apr 11, 1951, SWN:[id number]  Admit date - 05/09/2018   Admitting Physician Vianne Bulls, MD  Outpatient Primary MD for the patient is [name], [name], MD  LOS - 1  Chief Complaint  Patient presents with  . Respiratory Distress       Brief Narrative   Scott Rollins is a 67 y.o. 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    Subjective:    Scott Rollins today has, No headache, No chest pain, No abdominal pain - No Nausea, No new weakness tingling or numbness, No Cough -much improved shortness of breath.   Assessment  & Plan :     1.  Acute on chronic hypoxic respiratory failure requiring BiPAP in the ER.  Due to acute on chronic diastolic CHF recent EF 93%.- he is currently on combination of beta-blocker, Lasix, will apply Nitropaste, still has edema and rails but now down to nasal cannula oxygen, increase IV Lasix, monitor intake output, monitor weight and electrolytes closely.  Repeat echo pending.  Cardiology on board.  2.  Elevated but flat non-ACS pattern troponin.  Due to demand ischemia coming from #1 above, currently on heparin drip and beta-blocker, chest pain-free with EKG being nonacute.  Defer  continuation of heparin drip to cardiology.  Resume home dose statin.  3.  HX of metastatic prostate cancer.  On home hospice, he is currently revoked hospice for hospitalization.  Long-term goal of care is medical management and comfort.  No heroics, however he wants full code while he is in the hospital.  Continue home dose Decadron.  4.  Right lower extremity wound.  No signs of surrounding cellulitis, this could be due to edema and venous engorgement.  Wound care, stop broad-spectrum IV antibiotics and placed on oral doxycycline.  His leukocytosis seems to be chronically  elevated for a little while now could be due to malignancy.  Will monitor temperature curve.  5.  Smoking.  Counseled to quit.  6.  OSA.  BiPAP nightly.  7.  Paroxysmal atrial fibrillation [place] vas 2 score of at least 3.  On Eliquis at home currently on heparin drip, goal will be rate control, continue beta-blocker, monitor.  8.  Dyslipidemia.  Resume home dose statin.  9.  History of gout.  Resume home dose allopurinol and colchicine.  10.  Chronic pain.  Home medications resumed.    Family Communication  :  None  Code Status :  Full  Disposition Plan  :  Tele  Consults  :  Cards  Procedures  :    TTE  -   DVT Prophylaxis  :  Lovenox - Heparin GTT  Lab Results  Component Value Date   PLT 185 05/10/2018    Diet :  Diet Order            Diet Heart Room service appropriate? Yes; Fluid consistency: Thin; Fluid restriction: 1500 mL Fluid  Diet effective now               Inpatient Medications Scheduled Meds: . budesonide (PULMICORT) nebulizer solution  0.5 mg Nebulization BID  . doxycycline  100 mg Oral Q12H  . enalaprilat  0.625 mg Intravenous Q6H  . furosemide  60 mg Intravenous Q8H  . metoprolol tartrate  5 mg Intravenous Q6H  . morphine  15 mg Oral Q8H  . potassium chloride  40 mEq Oral Once   Continuous Infusions: . heparin 1,400 Units/hr (05/10/18 0813)   PRN Meds:.acetaminophen  **OR** [DISCONTINUED] acetaminophen, albuterol, LORazepam, ondansetron **OR** ondansetron (ZOFRAN) IV, oxyCODONE  Antibiotics  :   Anti-infectives (From admission, onward)   Start     Dose/Rate Route Frequency Ordered Stop   05/10/18 1400  vancomycin (VANCOCIN) 1,500 mg in sodium chloride 0.9 % 500 mL IVPB  Status:  Discontinued     1,500 mg 250 mL/hr over 120 Minutes Intravenous Every 24 hours 05/09/18 1459 05/10/18 1124   05/10/18 1130  doxycycline (VIBRA-TABS) tablet 100 mg     100 mg Oral Every 12 hours 05/10/18 1124     05/09/18 0830  vancomycin (VANCOCIN) IVPB 1000 mg/200 mL premix     1,000 mg 200 mL/hr over 60 Minutes Intravenous Every 1 hr x 2 05/09/18 0826 05/09/18 1029   05/09/18 0815  piperacillin-tazobactam (ZOSYN) IVPB 3.375 g  Status:  Discontinued     3.375 g 100 mL/hr over 30 Minutes Intravenous Every 8 hours 05/09/18 0812 05/10/18 1124          Objective:   Vitals:   05/10/18 0819 05/10/18 0838 05/10/18 0859 05/10/18 1100  BP:  135/90 135/90 135/90  Pulse:  68 60   Resp:  20 13   Temp: 97.9 F (36.6 C)     TempSrc: Oral     SpO2:  100% 99%   Weight:  106.7 kg      Wt Readings from Last 3 Encounters:  05/10/18 106.7 kg  03/11/18 104.9 kg  01/04/18 117.9 kg     Intake/Output Summary (Last 24 hours) at 05/10/2018 1125 Last data filed at 05/10/2018 0606 Gross per 24 hour  Intake 828.73 ml  Output 1700 ml  Net -871.27 ml     Physical Exam  Awake Alert, Oriented X 3, No new F.N deficits, Normal affect [place].AT,PERRAL Supple Neck,No JVD, No cervical lymphadenopathy appriciated.  Symmetrical Chest  wall movement, Good air movement bilaterally, CTAB RRR,No Gallops,Rubs or new Murmurs, No Parasternal Heave +ve B.Sounds, Abd Soft, No tenderness, No organomegaly appriciated, No rebound - guarding or rigidity. No Cyanosis, Clubbing, +ve 1-2+ leg  edema, right lower extremity wound on the lateral aspect and a bandage, no surrounding cellulitis    Data  Review:    CBC Recent Labs  Lab 05/09/18 0440 05/10/18 0536  WBC 15.7* 15.1*  HGB 14.8 14.4  HCT 48.7 45.6  PLT 187 185  MCV 92.2 90.1  MCH 28.0 28.5  MCHC 30.4 31.6  RDW 15.5 15.4  LYMPHSABS 1.4 1.0  MONOABS 0.6 0.8  EOSABS 0.0 0.0  BASOSABS 0.1 0.0    Chemistries  Recent Labs  Lab 05/09/18 0440 05/10/18 0536  NA 138 139  K 4.4 4.1  CL 97* 95*  CO2 30 33*  GLUCOSE 134* 125*  BUN 50* 55*  CREATININE 1.55* 1.23  CALCIUM 9.0 8.8*  MG 2.1 2.2  AST  --  30  ALT  --  33  ALKPHOS  --  48  BILITOT  --  0.7   ------------------------------------------------------------------------------------------------------------------ No results for input(s): CHOL, HDL, LDLCALC, TRIG, CHOLHDL, LDLDIRECT in the last 72 hours.  Lab Results  Component Value Date   HGBA1C 5.3 01/15/2017   ------------------------------------------------------------------------------------------------------------------ No results for input(s): TSH, T4TOTAL, T3FREE, THYROIDAB in the last 72 hours.  Invalid input(s): FREET3 ------------------------------------------------------------------------------------------------------------------ No results for input(s): VITAMINB12, FOLATE, FERRITIN, TIBC, IRON, RETICCTPCT in the last 72 hours.  Coagulation profile Recent Labs  Lab 05/09/18 1005 05/09/18 1136  INR SPECIMEN CLOTTED 0.92    No results for input(s): DDIMER in the last 72 hours.  Cardiac Enzymes Recent Labs  Lab 05/09/18 1136 05/09/18 2132 05/10/18 0536  TROPONINI 0.93* 1.06* 1.28*   ------------------------------------------------------------------------------------------------------------------    Component Value Date/Time   BNP 704.7 (H) 05/09/2018 0440    Micro Results Recent Results (from the past 240 hour(s))  MRSA PCR Screening     Status: None   Collection Time: 05/09/18  8:15 PM  Result Value Ref Range Status   MRSA by PCR NEGATIVE NEGATIVE Final    Comment:          The GeneXpert MRSA Assay (FDA approved for NASAL specimens only), is one component of a comprehensive MRSA colonization surveillance program. It is not intended to diagnose MRSA infection nor to guide or monitor treatment for MRSA infections. Performed at [facility], [place] [address] [place], [place] [address]     Radiology Reports Ct Head Wo Contrast  Result Date: 05/09/2018 CLINICAL DATA:  Altered mental status. EXAM: CT HEAD WITHOUT CONTRAST TECHNIQUE: Contiguous axial images were obtained from the base of the skull through the vertex without intravenous contrast. COMPARISON:  Report 09/24/2002 FINDINGS: Brain: Age related involutional changes of the brain with moderate appearing small vessel ischemic disease of periventricular white matter. No large vascular territory infarct, hemorrhage, midline shift or edema. No intra-axial mass nor extra-axial fluid. Midline fourth ventricle and basal cisterns. Brainstem cell Byrum are nonacute. Vascular: No hyperdense vessel sign. Skull: No acute skull fracture or suspicious osseous lesions. Sinuses/Orbits: Nonacute Other: None IMPRESSION: 1. Moderate small vessel ischemic disease of periventricular white matter, likely chronic given previous description in 2004. 2. No acute intracranial abnormality. Electronically Signed   By: Ashley Royalty M.D.   On: 05/09/2018 14:06   Dg Chest Port 1 View  Result Date: 05/09/2018 CLINICAL DATA:  67 year old male with shortness of breath. EXAM: PORTABLE CHEST 1  VIEW COMPARISON:  Chest radiograph dated 03/03/2018 FINDINGS: Bibasilar interstitial coarsening/scarring similar to prior radiograph. No focal consolidation, pleural effusion, or pneumothorax. Stable cardiomegaly. No acute osseous pathology. IMPRESSION: No focal consolidation.  No significant interval change. Electronically Signed   By: Anner Crete M.D.   On: 05/09/2018 05:11   Dg Ankle Right Port  Result Date: 05/09/2018 CLINICAL DATA:  Right  lower extremity swelling/redness EXAM: PORTABLE RIGHT ANKLE - 2 VIEW COMPARISON:  None. FINDINGS: No fracture or dislocation is seen. The ankle mortise is intact. Soft tissue ulceration/wound along the lateral aspect of the distal fibular shaft. No underlying cortical irregularity. IMPRESSION: And soft tissues recent/wound along the lateral aspect of the distal fibular shaft. No findings to suggest osteomyelitis.  No acute osseous abnormality. Electronically Signed   By: Julian Hy M.D.   On: 05/09/2018 10:36   Dg Foot 2 Views Right  Result Date: 05/09/2018 CLINICAL DATA:  Right lower extremity redness/swelling EXAM: RIGHT FOOT - 2 VIEW COMPARISON:  None. FINDINGS: No fracture or dislocation is seen. The joint spaces are preserved. Mild dorsal soft tissue swelling overlying the midfoot. Tiny plantar calcaneal enthesophyte. IMPRESSION: Mild dorsal soft tissue swelling overlying the midfoot. No acute osseus abnormality is seen. Electronically Signed   By: Julian Hy M.D.   On: 05/09/2018 10:36    Time Spent in minutes  30   Lala Lund M.D on 05/10/2018 at 11:25 AM  To page go to [email or web address] - password [email or web address]

## 2018-05-10 NOTE — Progress Notes (Signed)
PT Cancellation Note  Patient Details Name: Scott Rollins MRN: [id number] DOB: [birth year]   Cancelled Treatment:    Reason Eval/Treat Not Completed: Medical issues which prohibited therapy.  Pt troponin trending up. Will defer eval until cleared for participation in therapy.    Michel Santee 05/10/2018, 12:56 PM

## 2018-05-10 NOTE — Progress Notes (Signed)
*  Preliminary Results* Bilateral lower extremity venous duplex completed. Bilateral lower extremities are negative for deep vein thrombosis. There is no evidence of Baker's cyst bilaterally.    Right lower arterial duplex completed.  Right: 50-74% stenosis noted in the SFA.   05/10/2018 1:26 PM  Scott Rollins Dawna Part

## 2018-05-10 NOTE — Progress Notes (Signed)
Initial Nutrition Assessment  DOCUMENTATION CODES:   Non-severe (moderate) malnutrition in context of chronic illness, Obesity unspecified  INTERVENTION:   - Provide Ensure Enlive po BID, each supplement provides 350 kcal and 20 grams of protein (pt prefers chocolate flavor)  - Encourage adequate PO intake  NUTRITION DIAGNOSIS:   Moderate Malnutrition related to chronic illness (prostate cancer with mets to bone, COPD) as evidenced by mild muscle depletion, moderate muscle depletion, mild fat depletion, percent weight loss (14.9% weight loss in less than 6 months).  GOAL:   Patient will meet greater than or equal to 90% of their needs  MONITOR:   PO intake, Supplement acceptance, Weight trends, I & O's, Skin  REASON FOR ASSESSMENT:   Consult COPD Protocol  ASSESSMENT:   67 year old male who presented to the ED on 10/27 with respiratory distress. PMH significant for anxiety, arthritis, COPD, CAD, depression, sleep apnea, seizure disorder, hypertension, hyperlipidemia, and prostate cancer with mets to bone not currently undergoing treatment.   Pt is in hospice care at home and is full code.  Spoke with pt and wife at bedside. Pt resting with no complaints at time of visit.  Pt states that his appetite is "good" and that he typically eats 3 meals daily in addition to "grazing" throughout the day. Pt reports that a typical meal includes "a meat and two vegetables." Pt shares that he mainly drinks water in addition to 2-3 regular Ensure supplements daily. RD encouraged pt to continue consuming oral nutrition supplements after discharge.  Pt shares that he has lost over 100 lbs during the past 1 year. Pt reports his UBW prior to weight loss as 335 lbs; pt's wife reports pt's UBW as 315 lbs. Pt states that he now weighs "in the 200's."  Per weight history in chart, pt has lost 41 lbs in less than 6 months. This is a 14.9% weight loss which is significant for timeframe. Suspect  current fluid status is masking the extent of weight loss. Will monitor I/O's and weight trends during admission to determine extent of true weight loss.  RD provided pt with an Ensure Enlive (237 ml) and cup of ice at time of visit. Alerted RN so that fluid can be documented and included in 1500 ml fluid restriction.  Medications reviewed and include: Lasix 40 mg BID, IV antibiotics  Labs reviewed: BUN 55 (H), chloride 95 (L)  UOP: 1700 ml x 24 hours  NUTRITION - FOCUSED PHYSICAL EXAM:    Most Recent Value  Orbital Region  No depletion  Upper Arm Region  Mild depletion  Thoracic and Lumbar Region  No depletion  Buccal Region  No depletion  Temple Region  No depletion  Clavicle Bone Region  Mild depletion  Clavicle and Acromion Bone Region  Mild depletion  Scapular Bone Region  Unable to assess  Dorsal Hand  Moderate depletion  Patellar Region  Mild depletion  Anterior Thigh Region  Mild depletion  Posterior Calf Region  Unable to assess [severe edema to BLE]  Edema (RD Assessment)  Severe [BLE]  Hair  Reviewed  Eyes  Reviewed  Mouth  Reviewed  Skin  Reviewed  Nails  Reviewed      Diet Order:   Diet Order            Diet Heart Room service appropriate? Yes; Fluid consistency: Thin; Fluid restriction: 1500 mL Fluid  Diet effective now              EDUCATION NEEDS:  No education needs have been identified at this time  Skin:  Skin Assessment: Skin Integrity Issues: Stage II: right thigh Other: right tibial venous stasis ulcer  Last BM:  10/27 (large type 6)  Height:   Ht Readings from Last 1 Encounters:  03/11/18 5' 10.5" (1.791 m)    Weight:   Wt Readings from Last 1 Encounters:  05/10/18 106.7 kg    Ideal Body Weight:  76.82 kg  BMI:  Body mass index is 33.28 kg/m.  Estimated Nutritional Needs:   Kcal:  2100-2300  Protein:  105-115 grams  Fluid:  1500 ml fluid restriction per MD    Gaynell Face, MS, RD, LDN Inpatient  Clinical Dietitian Pager: [phone] Weekend/After Hours: [phone]

## 2018-05-10 NOTE — Progress Notes (Signed)
[place] for Heparin Indication: chest pain/ACS  Allergies  Allergen Reactions  . Amitriptyline Hcl Nausea Only    REACTION: nausea, elevated bp  . Ezetimibe Other (See Comments)    REACTION: chest pain, fatigue    Patient Measurements: Weight: 229 lb 4.5 oz (104 kg)(Per RN ) Heparin Dosing Weight: 97kg  Vital Signs: Temp: 98.2 F (36.8 C) (10/27 2216) Temp Source: Oral (10/27 2216) BP: 143/86 (10/28 0430) Pulse Rate: 66 (10/28 0430)  Labs: Recent Labs    05/09/18 0440 05/09/18 1005 05/09/18 1136 05/09/18 2132 05/10/18 0536  HGB 14.8  --   --   --  14.4  HCT 48.7  --   --   --  45.6  PLT 187  --   --   --  185  APTT  --   --  28  --   --   LABPROT  --  SPECIMEN CLOTTED 12.3  --   --   INR  --  SPECIMEN CLOTTED 0.92  --   --   HEPARINUNFRC  --   --  <0.10* 0.40 0.71*  CREATININE 1.55*  --   --   --   --   TROPONINI  --   --  0.93* 1.06* 1.28*    Estimated Creatinine Clearance: 55.5 mL/min (A) (by C-G formula based on SCr of 1.55 mg/dL (H)).   Medical History: Past Medical History:  Diagnosis Date  . Anginal pain ([place])   . Anxiety   . Arthritis    "hips, knees, thoracic back" (01/22/2017)  . CAP (community acquired pneumonia) 11/2016  . 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 COPD (chronic obstructive pulmonary disease) ([place])    "pearl study" pt thinks he is on Symbicort; pt noncompliant with COPD controller meds on/off due to financial reasons.  . Coronary atherosclerosis of unspecified type of vessel, native or graft 2005   MI, s/p PCI with stent (pt reports 20+ interventions in the past, most recent cat 6/08 showed patent stents).  Normal LV function.  Nuclear stress test NEG 8/09.  . Depression    ?bipolar dx by psychiatrist?  . Gouty arthritis    Dr. Amil Amen  . Heart murmur   . Hematochezia 09/2012   Hyperplastic polyp, diverticulosis, and internal hemorrhoids found on  colonoscopy 10/2012  . History of hiatal hernia   . Hyperkalemia 01/29/2017   Kayexalate 30 given x 1.  . Hyperlipidemia    Hx of multiple statin intolerance  . Hypertension   . 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 Migraine    "none in the 2000s" (01/15/2017)  . Mitral valve prolapse ~ 1981  . Neuropathic pain of both feet 2015/2016   Vit B12 and A1c normal 10/2014  . Obesities, morbid ([place])   . On home oxygen therapy    "2L prn" (01/15/2017)  . OSA on CPAP    w/oxygen (01/15/2017).  Noncompliance 2019.  Dr. Halford Chessman to repeat split night sleep study as of 12/2017 (not done as of 03/03/18)  . Osteoarthrosis, unspecified whether generalized or localized, unspecified site    DDD  . PAF (paroxysmal atrial fibrillation) ([place]) 12/2016   Started in the context of resp illness/lots of bronchodilators.  Started eliquis and cardizem during hospitalization 01/2017 (Dr. Rayann Heman).  . Pre-diabetes 2016/17  .  Prostate cancer metastatic to multiple sites [facility]) 09/2010; 2018   2012: Localized, high risk disease (Dr. Kimbrough/Grapey):  s/p 5 wks ext bm rad + seed boost, as well as hormone blockade x 1 yr.  Biochemical recurrence 11/2015;  2018 imaging showed bone mets and lymph node involvement.  Palliative Lupron and zytiga 2018/19--good PSA response as of 09/2017 onc f/u. **  To start hospice 03/17/18  . Radiation    Pelvic--for prostate ca: 25 treatments/01/2011  . Seizure disorder ([place]) 08-15-2011   Deja vu sensations: no w/u.  These stopped when neurontin 300mg  tid was started for a different reason.  . Status post chemotherapy    10/2010 thru 06/2011  . Tobacco dependence    still smoking as of fall 2018.    Medications:  Medications Prior to Admission  Medication Sig Dispense Refill Last Dose  . abiraterone acetate (ZYTIGA) 250 MG tablet Take 4 tablets (1,000 mg total) by mouth daily. Take on an  empty stomach 1 hour before or 2 hours after a meal 120 tablet 0 unknown  . albuterol (PROAIR HFA) 108 (90 Base) MCG/ACT inhaler Inhale 2 puffs into the lungs every 6 (six) hours as needed for wheezing or shortness of breath. 18 g 3 05/08/2018 at Unknown time  . albuterol (PROVENTIL) (2.5 MG/3ML) 0.083% nebulizer solution Take 3 mLs (2.5 mg total) by nebulization every 4 (four) hours as needed for wheezing or shortness of breath. 225 mL 3 05/08/2018 at Unknown time  . allopurinol (ZYLOPRIM) 300 MG tablet Take 1 tablet (300 mg total) by mouth daily. 90 tablet 3 05/08/2018 at Unknown time  . apixaban (ELIQUIS) 5 MG TABS tablet Take 1 tablet (5 mg total) by mouth 2 (two) times daily. 180 tablet 1 05/08/2018 at Unknown time  . aspirin EC 81 MG EC tablet Take 1 tablet (81 mg total) by mouth daily. 30 tablet 0 05/08/2018 at Unknown time  . citalopram (CELEXA) 40 MG tablet Take 1 tablet (40 mg total) by mouth at bedtime.   05/08/2018 at Unknown time  . dexamethasone (DECADRON) 4 MG tablet Take 12 mg by mouth daily.  0 05/08/2018 at Unknown time  . Dextromethorphan-guaiFENesin (MUCINEX DM) 30-600 MG TB12 Take 1 tablet by mouth every 12 (twelve) hours as needed for cough or congestion.  3 05/08/2018 at Unknown time  . diltiazem (CARDIZEM) 60 MG tablet Take 1 tablet (60 mg total) by mouth every 12 (twelve) hours. (Patient taking differently: Take 30 mg by mouth 2 (two) times daily. ) 60 tablet 0 05/08/2018 at Unknown time  . docusate sodium (COLACE) 100 MG capsule Take 100-200 mg by mouth daily as needed for constipation.  3 05/08/2018 at Unknown time  . feeding supplement, ENSURE ENLIVE, (ENSURE ENLIVE) LIQD Take 237 mLs by mouth 2 (two) times daily between meals. (Patient taking differently: Take 237 mLs by mouth See admin instructions. 237 mls --Three to four times per day.) 237 mL 12 05/08/2018 at Unknown time  . furosemide (LASIX) 40 MG tablet Take 1 tablet (40 mg total) by mouth 2 (two) times daily. (Patient  taking differently: Take 40 mg by mouth daily. ) 30 tablet 0 05/08/2018 at Unknown time  . gabapentin (NEURONTIN) 300 MG capsule Take 1 capsule (300 mg total) by mouth 2 (two) times daily. 30 capsule 0 05/08/2018 at Unknown time  . lisinopril (PRINIVIL,ZESTRIL) 10 MG tablet Take 10 mg by mouth daily.  1 05/08/2018 at Unknown time  . LORazepam (ATIVAN) 1 MG tablet Take 1 tablet (1  mg total) by mouth every 8 (eight) hours. 30 tablet 0 05/08/2018 at Unknown time  . morphine (MS CONTIN) 15 MG 12 hr tablet Take 15 mg by mouth every 8 (eight) hours.  0 05/08/2018 at Unknown time  . Oxycodone HCl 10 MG TABS Take 1 tablet (10 mg total) by mouth every 8 (eight) hours as needed. (Patient taking differently: Take 10-20 mg by mouth every 4 (four) hours as needed (pain). ) 90 tablet 0 05/08/2018 at Unknown time  . pantoprazole (PROTONIX) 40 MG tablet TAKE 1 TABLET BY MOUTH EVERY DAY (Patient taking differently: Take 40 mg by mouth daily. ) 30 tablet 6 05/08/2018 at Unknown time  . potassium chloride 20 MEQ TBCR Take 20 mEq by mouth 2 (two) times daily for 4 days. (Patient taking differently: Take 20 mEq by mouth daily. ) 8 tablet 0 05/08/2018 at Unknown time  . Probiotic Product (PROBIOTIC PO) Take 1 capsule by mouth daily.   05/08/2018 at Unknown time  . tamsulosin (FLOMAX) 0.4 MG CAPS capsule Take 1 capsule (0.4 mg total) by mouth daily after supper. (Patient taking differently: Take 0.4 mg by mouth as needed (urination). ) 30 capsule 3 05/08/2018 at Unknown time  . traMADol (ULTRAM) 50 MG tablet TAKE 1 TABLET BY MOUTH EVERY 6HRS AS NEEDED (Patient taking differently: Take 50 mg by mouth every 6 (six) hours as needed for moderate pain. ) 90 tablet 0 05/08/2018 at Unknown time  . zolpidem (AMBIEN) 10 MG tablet Take 1 tablet (10 mg total) by mouth at bedtime as needed for sleep. 30 tablet 0 05/08/2018 at Unknown time  . bisoprolol (ZEBETA) 10 MG tablet Take 1 tablet (10 mg total) by mouth daily. (Patient not taking:  Reported on 05/09/2018) 30 tablet 0 Not Taking at Unknown time  . budesonide-formoterol (SYMBICORT) 160-4.5 MCG/ACT inhaler Inhale 2 puffs into the lungs 2 (two) times daily. (Patient not taking: Reported on 03/03/2018) 3 Inhaler 3 Not Taking at Unknown time  . citalopram (CELEXA) 40 MG tablet Take 1 tablet (40 mg total) by mouth daily. (Patient not taking: Reported on 03/03/2018) 90 tablet 0 Not Taking at Unknown time  . colchicine 0.6 MG tablet Take 1 tablet (0.6 mg total) by mouth 2 (two) times daily as needed (gout flare ups). (Patient not taking: Reported on 05/09/2018) 60 tablet 6 Not Taking at Unknown time  . doxycycline (VIBRA-TABS) 100 MG tablet Take 1 tablet (100 mg total) by mouth every 12 (twelve) hours. (Patient not taking: Reported on 03/03/2018) 10 tablet 0 Not Taking at Unknown time  . Fluticasone-Umeclidin-Vilant (TRELEGY ELLIPTA) 100-62.5-25 MCG/INH AEPB Inhale 1 puff into the lungs daily. (Patient not taking: Reported on 05/09/2018) 28 each 5 Not Taking at Unknown time  . nicotine (NICODERM CQ - DOSED IN MG/24 HOURS) 14 mg/24hr patch Place 1 patch (14 mg total) onto the skin daily. (Patient not taking: Reported on 01/04/2018) 28 patch 0 Not Taking at Unknown time  . nystatin cream (MYCOSTATIN) Apply 1 application topically 2 (two) times daily. (Patient not taking: Reported on 03/03/2018) 30 g 1 Not Taking at Unknown time  . Oxycodone HCl 10 MG TABS Take 1-2 tablets (10-20 mg total) by mouth every 4 (four) hours as needed. (Patient not taking: Reported on 05/09/2018) 180 tablet 0 Not Taking at Unknown time  . potassium chloride SA (K-DUR,KLOR-CON) 20 MEQ tablet 1 tab po qd EVERY DAY (Patient not taking: Reported on 05/09/2018) 90 tablet 1 Not Taking at Unknown time  . prochlorperazine (COMPAZINE) 10  MG tablet Take 1 tablet (10 mg total) by mouth every 6 (six) hours as needed for nausea or vomiting. (Patient not taking: Reported on 05/09/2018) 30 tablet 2 Not Taking at Unknown time  . urea  (CARMOL) 40 % CREA Apply to right lower leg once daily (Patient not taking: Reported on 03/03/2018) 1 each 6 Not Taking at Unknown time   Scheduled:  . budesonide (PULMICORT) nebulizer solution  0.5 mg Nebulization BID  . enalaprilat  0.625 mg Intravenous Q6H  . furosemide  40 mg Intravenous BID  . metoprolol tartrate  5 mg Intravenous Q6H   Infusions:  . heparin 1,450 Units/hr (05/09/18 2006)  . piperacillin-tazobactam 3.375 g (05/10/18 0558)  . vancomycin      Assessment: Kwamaine was admitted today with resp distress. He was founded to have elevated troponin. Heparin was started in the ED but was supposed to be stopped due to the issue with his ischemic leg. A doppler was to be done and if neg, heparin was to be resume for the MI issue. However, they can't do the doppler today due to weekend. Hep was kept running. D/w the case with Dr. Olevia Bowens, we will keep the heparin going.   Heparin level slightly supratherapeutic this AM at 0.71, Will decrease heparin     Goal of Therapy:  Heparin level 0.3-0.7 units/ml Monitor platelets by anticoagulation protocol: Yes   Plan:  Decrease heparin to 1400 units/hr Heparin level in 6 hours Monitor for bleeding  Keonia Pasko A. Levada Dy, PharmD, [place] Pager: [phone] Please utilize Amion for appropriate phone number to reach the unit pharmacist ([place])

## 2018-05-10 NOTE — Progress Notes (Signed)
[place] for Heparin Indication: chest pain/ACS  Allergies  Allergen Reactions  . Amitriptyline Hcl Nausea Only    REACTION: nausea, elevated bp  . Ezetimibe Other (See Comments)    REACTION: chest pain, fatigue    Patient Measurements: Weight: 235 lb 3.7 oz (106.7 kg) Heparin Dosing Weight: 97kg  Vital Signs: Temp: 97.9 F (36.6 C) (10/28 0819) Temp Source: Oral (10/28 0819) BP: 135/90 (10/28 1100) Pulse Rate: 60 (10/28 0859)  Labs: Recent Labs    05/09/18 0440 05/09/18 1005  05/09/18 1136 05/09/18 2132 05/10/18 0536 05/10/18 1422  HGB 14.8  --   --   --   --  14.4  --   HCT 48.7  --   --   --   --  45.6  --   PLT 187  --   --   --   --  185  --   APTT  --   --   --  28  --   --   --   LABPROT  --  SPECIMEN CLOTTED  --  12.3  --   --   --   INR  --  SPECIMEN CLOTTED  --  0.92  --   --   --   HEPARINUNFRC  --   --    < > <0.10* 0.40 0.71* 0.90*  CREATININE 1.55*  --   --   --   --  1.23  --   TROPONINI  --   --   --  0.93* 1.06* 1.28*  --    < > = values in this interval not displayed.    Estimated Creatinine Clearance: 70.9 mL/min (by C-G formula based on SCr of 1.23 mg/dL).   Medical History: Past Medical History:  Diagnosis Date  . Anginal pain ([place])   . Anxiety   . Arthritis    "hips, knees, thoracic back" (01/22/2017)  . CAP (community acquired pneumonia) 11/2016  . 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 COPD (chronic obstructive pulmonary disease) ([place])    "pearl study" pt thinks he is on Symbicort; pt noncompliant with COPD controller meds on/off due to financial reasons.  . Coronary atherosclerosis of unspecified type of vessel, native or graft 2005   MI, s/p PCI with stent (pt reports 20+ interventions in the past, most recent cat 6/08 showed patent stents).  Normal LV function.  Nuclear stress test NEG 8/09.  . Depression    ?bipolar dx by psychiatrist?  . Gouty  arthritis    Dr. Amil Amen  . Heart murmur   . Hematochezia 09/2012   Hyperplastic polyp, diverticulosis, and internal hemorrhoids found on colonoscopy 10/2012  . History of hiatal hernia   . Hyperkalemia 01/29/2017   Kayexalate 30 given x 1.  . Hyperlipidemia    Hx of multiple statin intolerance  . Hypertension   . 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 Migraine    "none in the 2000s" (01/15/2017)  . Mitral valve prolapse ~ 1981  . Neuropathic pain of both feet 2015/2016   Vit B12 and A1c normal 10/2014  . Obesities, morbid ([place])   . On home oxygen therapy    "2L prn" (01/15/2017)  . OSA on CPAP    w/oxygen (01/15/2017).  Noncompliance 2019.  Dr. Halford Chessman to repeat  split night sleep study as of 12/2017 (not done as of 03/03/18)  . Osteoarthrosis, unspecified whether generalized or localized, unspecified site    DDD  . PAF (paroxysmal atrial fibrillation) ([place]) 12/2016   Started in the context of resp illness/lots of bronchodilators.  Started eliquis and cardizem during hospitalization 01/2017 (Dr. Rayann Heman).  . Pre-diabetes 2016/17  . Prostate cancer metastatic to multiple sites [facility]) 09/2010; 2018   2012: Localized, high risk disease (Dr. Kimbrough/Grapey):  s/p 5 wks ext bm rad + seed boost, as well as hormone blockade x 1 yr.  Biochemical recurrence 11/2015;  2018 imaging showed bone mets and lymph node involvement.  Palliative Lupron and zytiga 2018/19--good PSA response as of 09/2017 onc f/u. **  To start hospice 03/17/18  . Radiation    Pelvic--for prostate ca: 25 treatments/01/2011  . Seizure disorder ([place]) 08-15-2011   Deja vu sensations: no w/u.  These stopped when neurontin 300mg  tid was started for a different reason.  . Status post chemotherapy    10/2010 thru 06/2011  . Tobacco dependence    still smoking as of fall 2018.    Medications:  Medications Prior to Admission   Medication Sig Dispense Refill Last Dose  . abiraterone acetate (ZYTIGA) 250 MG tablet Take 4 tablets (1,000 mg total) by mouth daily. Take on an empty stomach 1 hour before or 2 hours after a meal 120 tablet 0 unknown  . albuterol (PROAIR HFA) 108 (90 Base) MCG/ACT inhaler Inhale 2 puffs into the lungs every 6 (six) hours as needed for wheezing or shortness of breath. 18 g 3 05/08/2018 at Unknown time  . albuterol (PROVENTIL) (2.5 MG/3ML) 0.083% nebulizer solution Take 3 mLs (2.5 mg total) by nebulization every 4 (four) hours as needed for wheezing or shortness of breath. 225 mL 3 05/08/2018 at Unknown time  . allopurinol (ZYLOPRIM) 300 MG tablet Take 1 tablet (300 mg total) by mouth daily. 90 tablet 3 05/08/2018 at Unknown time  . apixaban (ELIQUIS) 5 MG TABS tablet Take 1 tablet (5 mg total) by mouth 2 (two) times daily. 180 tablet 1 05/08/2018 at Unknown time  . aspirin EC 81 MG EC tablet Take 1 tablet (81 mg total) by mouth daily. 30 tablet 0 05/08/2018 at Unknown time  . citalopram (CELEXA) 40 MG tablet Take 1 tablet (40 mg total) by mouth at bedtime.   05/08/2018 at Unknown time  . dexamethasone (DECADRON) 4 MG tablet Take 12 mg by mouth daily.  0 05/08/2018 at Unknown time  . Dextromethorphan-guaiFENesin (MUCINEX DM) 30-600 MG TB12 Take 1 tablet by mouth every 12 (twelve) hours as needed for cough or congestion.  3 05/08/2018 at Unknown time  . diltiazem (CARDIZEM) 60 MG tablet Take 1 tablet (60 mg total) by mouth every 12 (twelve) hours. (Patient taking differently: Take 30 mg by mouth 2 (two) times daily. ) 60 tablet 0 05/08/2018 at Unknown time  . docusate sodium (COLACE) 100 MG capsule Take 100-200 mg by mouth daily as needed for constipation.  3 05/08/2018 at Unknown time  . feeding supplement, ENSURE ENLIVE, (ENSURE ENLIVE) LIQD Take 237 mLs by mouth 2 (two) times daily between meals. (Patient taking differently: Take 237 mLs by mouth See admin instructions. 237 mls --Three to four times  per day.) 237 mL 12 05/08/2018 at Unknown time  . furosemide (LASIX) 40 MG tablet Take 1 tablet (40 mg total) by mouth 2 (two) times daily. (Patient taking differently: Take 40 mg by mouth daily. ) 30 tablet  0 05/08/2018 at Unknown time  . gabapentin (NEURONTIN) 300 MG capsule Take 1 capsule (300 mg total) by mouth 2 (two) times daily. 30 capsule 0 05/08/2018 at Unknown time  . lisinopril (PRINIVIL,ZESTRIL) 10 MG tablet Take 10 mg by mouth daily.  1 05/08/2018 at Unknown time  . LORazepam (ATIVAN) 1 MG tablet Take 1 tablet (1 mg total) by mouth every 8 (eight) hours. 30 tablet 0 05/08/2018 at Unknown time  . morphine (MS CONTIN) 15 MG 12 hr tablet Take 15 mg by mouth every 8 (eight) hours.  0 05/08/2018 at Unknown time  . Oxycodone HCl 10 MG TABS Take 1 tablet (10 mg total) by mouth every 8 (eight) hours as needed. (Patient taking differently: Take 10-20 mg by mouth every 4 (four) hours as needed (pain). ) 90 tablet 0 05/08/2018 at Unknown time  . pantoprazole (PROTONIX) 40 MG tablet TAKE 1 TABLET BY MOUTH EVERY DAY (Patient taking differently: Take 40 mg by mouth daily. ) 30 tablet 6 05/08/2018 at Unknown time  . potassium chloride 20 MEQ TBCR Take 20 mEq by mouth 2 (two) times daily for 4 days. (Patient taking differently: Take 20 mEq by mouth daily. ) 8 tablet 0 05/08/2018 at Unknown time  . Probiotic Product (PROBIOTIC PO) Take 1 capsule by mouth daily.   05/08/2018 at Unknown time  . tamsulosin (FLOMAX) 0.4 MG CAPS capsule Take 1 capsule (0.4 mg total) by mouth daily after supper. (Patient taking differently: Take 0.4 mg by mouth as needed (urination). ) 30 capsule 3 05/08/2018 at Unknown time  . traMADol (ULTRAM) 50 MG tablet TAKE 1 TABLET BY MOUTH EVERY 6HRS AS NEEDED (Patient taking differently: Take 50 mg by mouth every 6 (six) hours as needed for moderate pain. ) 90 tablet 0 05/08/2018 at Unknown time  . zolpidem (AMBIEN) 10 MG tablet Take 1 tablet (10 mg total) by mouth at bedtime as needed  for sleep. 30 tablet 0 05/08/2018 at Unknown time  . bisoprolol (ZEBETA) 10 MG tablet Take 1 tablet (10 mg total) by mouth daily. (Patient not taking: Reported on 05/09/2018) 30 tablet 0 Not Taking at Unknown time  . budesonide-formoterol (SYMBICORT) 160-4.5 MCG/ACT inhaler Inhale 2 puffs into the lungs 2 (two) times daily. (Patient not taking: Reported on 03/03/2018) 3 Inhaler 3 Not Taking at Unknown time  . citalopram (CELEXA) 40 MG tablet Take 1 tablet (40 mg total) by mouth daily. (Patient not taking: Reported on 03/03/2018) 90 tablet 0 Not Taking at Unknown time  . colchicine 0.6 MG tablet Take 1 tablet (0.6 mg total) by mouth 2 (two) times daily as needed (gout flare ups). (Patient not taking: Reported on 05/09/2018) 60 tablet 6 Not Taking at Unknown time  . doxycycline (VIBRA-TABS) 100 MG tablet Take 1 tablet (100 mg total) by mouth every 12 (twelve) hours. (Patient not taking: Reported on 03/03/2018) 10 tablet 0 Not Taking at Unknown time  . Fluticasone-Umeclidin-Vilant (TRELEGY ELLIPTA) 100-62.5-25 MCG/INH AEPB Inhale 1 puff into the lungs daily. (Patient not taking: Reported on 05/09/2018) 28 each 5 Not Taking at Unknown time  . nicotine (NICODERM CQ - DOSED IN MG/24 HOURS) 14 mg/24hr patch Place 1 patch (14 mg total) onto the skin daily. (Patient not taking: Reported on 01/04/2018) 28 patch 0 Not Taking at Unknown time  . nystatin cream (MYCOSTATIN) Apply 1 application topically 2 (two) times daily. (Patient not taking: Reported on 03/03/2018) 30 g 1 Not Taking at Unknown time  . Oxycodone HCl 10 MG TABS Take  1-2 tablets (10-20 mg total) by mouth every 4 (four) hours as needed. (Patient not taking: Reported on 05/09/2018) 180 tablet 0 Not Taking at Unknown time  . potassium chloride SA (K-DUR,KLOR-CON) 20 MEQ tablet 1 tab po qd EVERY DAY (Patient not taking: Reported on 05/09/2018) 90 tablet 1 Not Taking at Unknown time  . prochlorperazine (COMPAZINE) 10 MG tablet Take 1 tablet (10 mg total) by  mouth every 6 (six) hours as needed for nausea or vomiting. (Patient not taking: Reported on 05/09/2018) 30 tablet 2 Not Taking at Unknown time  . urea (CARMOL) 40 % CREA Apply to right lower leg once daily (Patient not taking: Reported on 03/03/2018) 1 each 6 Not Taking at Unknown time   Scheduled:  . allopurinol  300 mg Oral Daily  . aspirin EC  81 mg Oral Daily  . bisoprolol  5 mg Oral Daily  . budesonide (PULMICORT) nebulizer solution  0.5 mg Nebulization BID  . citalopram  40 mg Oral Daily  . dexamethasone  12 mg Oral Daily  . docusate sodium  100-200 mg Oral BID  . doxycycline  100 mg Oral Q12H  . enalaprilat  0.625 mg Intravenous Q6H  . feeding supplement (ENSURE ENLIVE)  237 mL Oral BID BM  . furosemide  40 mg Oral BID  . gabapentin  300 mg Oral BID  . morphine  15 mg Oral Q8H  . nitroGLYCERIN  0.5 inch Topical Q6H  . pantoprazole  40 mg Oral Daily   Infusions:  . heparin 1,400 Units/hr (05/10/18 1351)    Assessment: Najir was admitted today with resp distress. He was founded to have elevated troponin. Heparin was started in the ED but was supposed to be stopped due to the issue with his ischemic leg. A doppler was to be done and if neg, heparin was to be resume for the MI issue. However, they can't do the doppler today due to weekend. Hep was kept running. D/w the case with Dr. Olevia Bowens, we will keep the heparin going.   Heparin level this afternoon has increased to 0.9. Will decrease heparin     Goal of Therapy:  Heparin level 0.3-0.7 units/ml Monitor platelets by anticoagulation protocol: Yes   Plan:  Decrease heparin to 1200 units/hr Heparin level in 6 hours Monitor for bleeding  Demetrie Borge A. Levada Dy, PharmD, [place] Pager: [phone] Please utilize Amion for appropriate phone number to reach the unit pharmacist ([place])

## 2018-05-10 NOTE — Progress Notes (Signed)
Patient is complaining of 9/10 hip pain.  Patient is a hospice patient with bone cancer with no relief with the tylenol.  Notified Jeannette Corpus, NP.  Will continue to monitor the patient.

## 2018-05-10 NOTE — Progress Notes (Signed)
OT Cancellation Note  Patient Details Name: Scott Rollins MRN: [id number] DOB: Dec 08, 1950   Cancelled Treatment:    Reason Eval/Treat Not Completed: Medical issues which prohibited therapy. Pt's troponin level's trending up. OT evaluation deferred and will attempt at next available time.   Gypsy Decant, MS, OTR/L 05/10/2018, 1:25 PM

## 2018-05-10 NOTE — Progress Notes (Signed)
Progress Note  Patient Name: Scott Rollins Date of Encounter: 05/10/2018  Primary Cardiologist: Dorris Carnes, MD   Subjective   Still a little short of breath but much better than prior to admission.   Inpatient Medications    Scheduled Meds: . allopurinol  300 mg Oral Daily  . aspirin EC  81 mg Oral Daily  . budesonide (PULMICORT) nebulizer solution  0.5 mg Nebulization BID  . citalopram  40 mg Oral Daily  . dexamethasone  12 mg Oral Daily  . docusate sodium  100-200 mg Oral BID  . doxycycline  100 mg Oral Q12H  . enalaprilat  0.625 mg Intravenous Q6H  . feeding supplement (ENSURE ENLIVE)  237 mL Oral BID BM  . furosemide  60 mg Intravenous Q8H  . gabapentin  300 mg Oral BID  . metoprolol tartrate  5 mg Intravenous Q6H  . morphine  15 mg Oral Q8H  . nitroGLYCERIN  0.5 inch Topical Q6H  . pantoprazole  40 mg Oral Daily   Continuous Infusions: . heparin 1,400 Units/hr (05/10/18 1351)   PRN Meds: acetaminophen **OR** [DISCONTINUED] acetaminophen, albuterol, colchicine, LORazepam, [DISCONTINUED] ondansetron **OR** ondansetron (ZOFRAN) IV, oxyCODONE, tamsulosin   Vital Signs    Vitals:   05/10/18 0819 05/10/18 0838 05/10/18 0859 05/10/18 1100  BP:  135/90 135/90 135/90  Pulse:  68 60   Resp:  20 13   Temp: 97.9 F (36.6 C)     TempSrc: Oral     SpO2:  100% 99% 99%  Weight:  106.7 kg      Intake/Output Summary (Last 24 hours) at 05/10/2018 1437 Last data filed at 05/10/2018 1410 Gross per 24 hour  Intake 478.73 ml  Output 800 ml  Net -321.27 ml   Filed Weights   05/09/18 0800 05/10/18 0838  Weight: 104 kg 106.7 kg    Telemetry    Sinus rhythm - Personally Reviewed  ECG    Sinus rhythm.  Rate 76 bpm.  Early R wave progression.  - Personally Reviewed  Physical Exam   VS:  BP 135/90   Pulse 60   Temp 97.9 F (36.6 C) (Oral)   Resp 13   Wt 106.7 kg   SpO2 99%   BMI 33.28 kg/m  , BMI Body mass index is 33.28 kg/m. GENERAL:  Chronically  ill-appearing HEENT: Pupils equal round and reactive, fundi not visualized, oral mucosa unremarkable NECK:  No jugular venous distention, waveform within normal limits, carotid upstroke brisk and symmetric, no bruits LUNGS:  Clear to auscultation bilaterally HEART:  RRR.  PMI not displaced or sustained,S1 and S2 within normal limits, no S3, no S4, no clicks, no rubs, no murmurs ABD:  Flat, positive bowel sounds normal in frequency in pitch, no bruits, no rebound, no guarding, no midline pulsatile mass, no hepatomegaly, no splenomegaly EXT:  2 plus pulses throughout, R>L LE edema, no cyanosis no clubbing SKIN:  R LE erythema NEURO:  Cranial nerves II through XII grossly intact, motor grossly intact throughout [facility]:  Cognitively intact, oriented to person place and time   Labs    Chemistry Recent Labs  Lab 05/09/18 0440 05/10/18 0536  NA 138 139  K 4.4 4.1  CL 97* 95*  CO2 30 33*  GLUCOSE 134* 125*  BUN 50* 55*  CREATININE 1.55* 1.23  CALCIUM 9.0 8.8*  PROT  --  5.3*  ALBUMIN  --  2.7*  AST  --  30  ALT  --  33  ALKPHOS  --  60  BILITOT  --  0.7  GFRNONAA 44* 59*  GFRAA 51* >60  ANIONGAP 11 11     Hematology Recent Labs  Lab 05/09/18 0440 05/10/18 0536  WBC 15.7* 15.1*  RBC 5.28 5.06  HGB 14.8 14.4  HCT 48.7 45.6  MCV 92.2 90.1  MCH 28.0 28.5  MCHC 30.4 31.6  RDW 15.5 15.4  PLT 187 185    Cardiac Enzymes Recent Labs  Lab 05/09/18 1136 05/09/18 2132 05/10/18 0536  TROPONINI 0.93* 1.06* 1.28*    Recent Labs  Lab 05/09/18 0514  TROPIPOC 0.86*     BNP Recent Labs  Lab 05/09/18 0440  BNP 704.7*     DDimer No results for input(s): DDIMER in the last 168 hours.   Radiology    Ct Head Wo Contrast  Result Date: 05/09/2018 CLINICAL DATA:  Altered mental status. EXAM: CT HEAD WITHOUT CONTRAST TECHNIQUE: Contiguous axial images were obtained from the base of the skull through the vertex without intravenous contrast. COMPARISON:  Report 09/24/2002  FINDINGS: Brain: Age related involutional changes of the brain with moderate appearing small vessel ischemic disease of periventricular white matter. No large vascular territory infarct, hemorrhage, midline shift or edema. No intra-axial mass nor extra-axial fluid. Midline fourth ventricle and basal cisterns. Brainstem cell Byrum are nonacute. Vascular: No hyperdense vessel sign. Skull: No acute skull fracture or suspicious osseous lesions. Sinuses/Orbits: Nonacute Other: None IMPRESSION: 1. Moderate small vessel ischemic disease of periventricular white matter, likely chronic given previous description in 2004. 2. No acute intracranial abnormality. Electronically Signed   By: Ashley Royalty M.D.   On: 05/09/2018 14:06   Dg Chest Port 1 View  Result Date: 05/09/2018 CLINICAL DATA:  67 year old male with shortness of breath. EXAM: PORTABLE CHEST 1 VIEW COMPARISON:  Chest radiograph dated 03/03/2018 FINDINGS: Bibasilar interstitial coarsening/scarring similar to prior radiograph. No focal consolidation, pleural effusion, or pneumothorax. Stable cardiomegaly. No acute osseous pathology. IMPRESSION: No focal consolidation.  No significant interval change. Electronically Signed   By: Anner Crete M.D.   On: 05/09/2018 05:11   Dg Ankle Right Port  Result Date: 05/09/2018 CLINICAL DATA:  Right lower extremity swelling/redness EXAM: PORTABLE RIGHT ANKLE - 2 VIEW COMPARISON:  None. FINDINGS: No fracture or dislocation is seen. The ankle mortise is intact. Soft tissue ulceration/wound along the lateral aspect of the distal fibular shaft. No underlying cortical irregularity. IMPRESSION: And soft tissues recent/wound along the lateral aspect of the distal fibular shaft. No findings to suggest osteomyelitis.  No acute osseous abnormality. Electronically Signed   By: Julian Hy M.D.   On: 05/09/2018 10:36   Dg Foot 2 Views Right  Result Date: 05/09/2018 CLINICAL DATA:  Right lower extremity redness/swelling  EXAM: RIGHT FOOT - 2 VIEW COMPARISON:  None. FINDINGS: No fracture or dislocation is seen. The joint spaces are preserved. Mild dorsal soft tissue swelling overlying the midfoot. Tiny plantar calcaneal enthesophyte. IMPRESSION: Mild dorsal soft tissue swelling overlying the midfoot. No acute osseus abnormality is seen. Electronically Signed   By: Julian Hy M.D.   On: 05/09/2018 10:36    Cardiac Studies   Echo pending  LE Dopplers 05/10/18: Negative for DVT.   Patient Profile     67 y.o. male with metastatic prostate cancer, hypertension, CAD status post prior PCI, paroxysmal atrial fibrillation, multifocal atrial tachycardia, COPD, admitted with hypercarbic respiratory failure and cellulitis.  He also had acute on chronic diastolic heart failure.  Assessment & Plan    # Acute on chronic  diastolic heart failure:  BNP was elevated to 704.  He continues to have some mild lower extremity edema.  Echocardiogram was preliminarily reviewed.  Systolic function appears normal.  His IVC is flat.  Therefore we will stop the IV Lasix he is receiving every 8 hours and switch back to his home oral regimen.  # Elevated troponin: # CAD s/p PCI: Troponin elevated to 1.28.  Mr. Norkus reports that he has not had any issues with chest pain prior to this admission.  It is likely that the chest discomfort he was experiencing was related to demand ischemia and volume overload and the pain seems to have resolved.  He does not want any aggressive interventions given that he is on hospice.  We focus on comfort.  Stop IV metoprolol and start 1/2 dose of his home bisoprolol.  If he has recurrent chest pain we can add long acting nitrates.  Continue heparin x48 hours then resume his home Eliquis.   # Hypertension: Home diltiazem and lisinopril on hold. Restarting bisoprolol as above.  # Paroxysmal atrial fibrillation:  Currently in sinus rhythm.  Holding dilitazem and starting back bisoprolol as above.   Will switch back to Eliquis prior to discharge.        For questions or updates, please contact [place] Please consult [email or web address] for contact info under        Signed, Skeet Latch, MD  05/10/2018, 2:37 PM

## 2018-05-10 NOTE — Consult Note (Signed)
[facility] Nurse wound consult note Reason for Consult: Consult requested for right leg.  Pt developed generalized edema and some bulla areas which have ruptured and evolved into full thickness wounds. Wound type: Right anterior calf with 2 full thickness wounds; 6X6X.2cm and 4X4X.2cm; both are red and moist with large amt yellow drainage, no odor Dressing procedure/placement/frequency: Foam dressing to absorb drainage and promote healing.  Float heel to reduce pressure.  Discussed plan of care with patient and wife at the bedside. Please re-consult if further assistance is needed.  Thank-you,  Julien Girt MSN, [place], [place], [place], [place]

## 2018-05-11 LAB — CBC
HCT: 45.7 % (ref 39.0–52.0)
HEMOGLOBIN: 14.2 g/dL (ref 13.0–17.0)
MCH: 28.6 pg (ref 26.0–34.0)
MCHC: 31.1 g/dL (ref 30.0–36.0)
MCV: 92.1 fL (ref 80.0–100.0)
Platelets: 168 10*3/uL (ref 150–400)
RBC: 4.96 MIL/uL (ref 4.22–5.81)
RDW: 15.1 % (ref 11.5–15.5)
WBC: 12.4 10*3/uL — ABNORMAL HIGH (ref 4.0–10.5)
nRBC: 0 % (ref 0.0–0.2)

## 2018-05-11 LAB — BASIC METABOLIC PANEL
Anion gap: 9 (ref 5–15)
BUN: 61 mg/dL — AB (ref 8–23)
CALCIUM: 8.9 mg/dL (ref 8.9–10.3)
CO2: 41 mmol/L — AB (ref 22–32)
Chloride: 91 mmol/L — ABNORMAL LOW (ref 98–111)
Creatinine, Ser: 1.31 mg/dL — ABNORMAL HIGH (ref 0.61–1.24)
GFR calc Af Amer: >60 mL/min (ref >60)
GFR, EST NON AFRICAN AMERICAN: 54 mL/min — AB (ref >60)
GLUCOSE: 123 mg/dL — AB (ref 70–99)
Potassium: 4.2 mmol/L (ref 3.5–5.1)
Sodium: 141 mmol/L (ref 135–145)

## 2018-05-11 LAB — HEPARIN LEVEL (UNFRACTIONATED): HEPARIN UNFRACTIONATED: 0.7 [IU]/mL (ref 0.30–0.70)

## 2018-05-11 LAB — MAGNESIUM: Magnesium: 2.2 mg/dL (ref 1.7–2.4)

## 2018-05-11 MED ORDER — DILTIAZEM HCL 60 MG PO TABS
30.0000 mg | ORAL_TABLET | Freq: Two times a day (BID) | ORAL | Status: DC
  Administered 2018-05-11 – 2018-05-12 (×3): 30 mg via ORAL
  Filled 2018-05-11 (×4): qty 1

## 2018-05-11 MED ORDER — FUROSEMIDE 40 MG PO TABS
40.0000 mg | ORAL_TABLET | Freq: Two times a day (BID) | ORAL | Status: DC
  Administered 2018-05-12: 40 mg via ORAL
  Filled 2018-05-11: qty 1

## 2018-05-11 MED ORDER — APIXABAN 5 MG PO TABS
5.0000 mg | ORAL_TABLET | Freq: Two times a day (BID) | ORAL | Status: DC
  Administered 2018-05-11 – 2018-05-12 (×3): 5 mg via ORAL
  Filled 2018-05-11 (×3): qty 1

## 2018-05-11 MED ORDER — FUROSEMIDE 40 MG PO TABS: 40.0000 mg | ORAL_TABLET | Freq: Two times a day (BID) | ORAL | Status: DC

## 2018-05-11 NOTE — Evaluation (Signed)
Physical Therapy Evaluation Patient Details Name: Scott Rollins MRN: [id number] DOB: [birth year] Today's Date: 05/11/2018   History of Present Illness  Pt is a 67 y.o. 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   Clinical Impression  Pt admitted with/for progressively worsening dyspnea over a week or more.  Found to be due to CHF.  Pt is presently deconditioned needing min to max assist for basic mobility.  Pt currently limited functionally due to the problems listed. ( See problems list.)   Pt will benefit from PT to maximize function and safety in order to get ready for next venue listed below.     Follow Up Recommendations SNF;Other (comment);Supervision/Assistance - 24 hour(unless hospice vs family(caregiver) assist is enough care.)    Equipment Recommendations  Other (comment)(TBA)    Recommendations for Other Services       Precautions / Restrictions Precautions Precautions: Fall Restrictions Weight Bearing Restrictions: No      Mobility  Bed Mobility Overal bed mobility: Needs Assistance Bed Mobility: Supine to Sit     Supine to sit: Max assist;+2 for safety/equipment     General bed mobility comments: assist to intiate LEs over EOB and to elevate trunk; cues/assist to scoot hips towards EOB  Transfers Overall transfer level: Needs assistance Equipment used: Rolling walker (2 wheeled) Transfers: Sit to/from [facility] Sit to Stand: Min assist;+2 physical assistance;+2  safety/equipment Stand pivot transfers: Min assist;+2 physical assistance;+2 safety/equipment       General transfer comment: assist to boost into standing and steady at RW; steadying assist to take pivotal steps towards recliner with assist for maneuvering RW; VCs for safe hand placement  Ambulation/Gait             General Gait Details: pivot steps only  Stairs            Wheelchair Mobility    Modified Rankin (Stroke Patients Only)       Balance Overall balance assessment: Needs assistance Sitting-balance support: Feet supported Sitting balance-Leahy Scale: Fair     Standing balance support: Bilateral upper extremity supported Standing balance-Leahy Scale: Poor Standing balance comment: reliant on external assist or RW                             Pertinent Vitals/Pain Pain Assessment: Faces Faces Pain Scale: Hurts little more Pain Location: RLE with certain movements Pain Descriptors / Indicators: Grimacing;Guarding Pain Intervention(s): Limited activity within patient's tolerance    Home Living Family/patient expects to be discharged to:: Private residence Living Arrangements: Spouse/significant other;Children Available Help at Discharge: Family Type of Home: House Home Access: Stairs to enter     Home Layout: Multi-level Home Equipment: Environmental consultant - 2 wheels;Cane - single point;Shower seat;Walker - 4 wheels;Crutches;Other (comment);Adaptive equipment Additional Comments: PLOF partially obtained via chart review due to pt having difficulty answering questions; information pt provided this session is consistent with previous information     Prior Function Level of Independence: Needs assistance   Gait / Transfers Assistance Needed: using RW for mobility  ADL's / Homemaking Assistance Needed: reports spouse/daughter assist with ADLs  Hand Dominance        Extremity/Trunk Assessment   Upper Extremity Assessment Upper Extremity  Assessment: Generalized weakness    Lower Extremity Assessment Lower Extremity Assessment: Generalized weakness       Communication   Communication: No difficulties  Cognition Arousal/Alertness: Awake/alert;Lethargic(intermittently lethargic) Behavior During Therapy: Flat affect Overall Cognitive Status: No family/caregiver present to determine baseline cognitive functioning                                 General Comments: pt intermittently with significant delay when responding to questions or following commands; requires repetition and multimodal cues      General Comments General comments (skin integrity, edema, etc.): Verbal clearance by Dr Candiss Norse for okay to mobilize OOB.  pt displayed long delays between commands and initiation of answer or mobility.    Exercises     Assessment/Plan    PT Assessment Patient needs continued PT services  PT Problem List Decreased strength;Decreased activity tolerance;Decreased balance;Decreased mobility;Decreased cognition       PT Treatment Interventions DME instruction;Gait training;Functional mobility training;Therapeutic activities;Balance training;Patient/family education    PT Goals (Current goals can be found in the Care Plan section)  Acute Rehab PT Goals Patient Stated Goal: none stated PT Goal Formulation: Patient unable to participate in goal setting Time For Goal Achievement: 05/25/18 Potential to Achieve Goals: Good    Frequency Min 3X/week   Barriers to discharge        Co-evaluation PT/OT/SLP Co-Evaluation/Treatment: Yes Reason for Co-Treatment: Complexity of the patient's impairments (multi-system involvement) PT goals addressed during session: Mobility/safety with mobility OT goals addressed during session: ADL's and self-care       AM-PAC PT "6 Clicks" Daily Activity  Outcome Measure Difficulty turning over in bed (including adjusting bedclothes, sheets and blankets)?: Unable Difficulty  moving from lying on back to sitting on the side of the bed? : Unable Difficulty sitting down on and standing up from a chair with arms (e.g., wheelchair, bedside commode, etc,.)?: Unable Help needed moving to and from a bed to chair (including a wheelchair)?: A Little Help needed walking in hospital room?: A Little Help needed climbing 3-5 steps with a railing? : A Lot 6 Click Score: 11    End of Session   Activity Tolerance: Patient tolerated treatment well Patient left: in chair;with call bell/phone within reach;with chair alarm set Nurse Communication: Mobility status PT Visit Diagnosis: Unsteadiness on feet (R26.81);Muscle weakness (generalized) (M62.81);Difficulty in walking, not elsewhere classified (R26.2);Other symptoms and signs involving the nervous system (R29.898)    Time: 1610-1640 PT Time Calculation (min) (ACUTE ONLY): 30 min   Charges:   PT Evaluation $PT Eval Moderate Complexity: 1 Mod          05/11/2018  Donnella Sham, PT Acute Rehabilitation Services [phone]  (pager) [phone]  (office)  Tessie Fass Jessyca Sloan 05/11/2018, 5:31 PM

## 2018-05-11 NOTE — Progress Notes (Addendum)
[place] for Apixaban Indication: chest pain/ACS  Allergies  Allergen Reactions  . Amitriptyline Hcl Nausea Only    REACTION: nausea, elevated bp  . Ezetimibe Other (See Comments)    REACTION: chest pain, fatigue    Patient Measurements: Weight: 232 lb 2.3 oz (105.3 kg) Heparin Dosing Weight: 97kg  Vital Signs: Temp: 98.4 F (36.9 C) (10/29 0533) BP: 133/85 (10/29 0533) Pulse Rate: 71 (10/29 0533)  Labs: Recent Labs    05/09/18 0440 05/09/18 1005  05/09/18 1136 05/09/18 2132 05/10/18 0536 05/10/18 1422 05/10/18 2200 05/11/18 0544  HGB 14.8  --   --   --   --  14.4  --   --  14.2  HCT 48.7  --   --   --   --  45.6  --   --  45.7  PLT 187  --   --   --   --  185  --   --  168  APTT  --   --   --  28  --   --   --   --   --   LABPROT  --  SPECIMEN CLOTTED  --  12.3  --   --   --   --   --   INR  --  SPECIMEN CLOTTED  --  0.92  --   --   --   --   --   HEPARINUNFRC  --   --    < > <0.10* 0.40 0.71* 0.90* 0.66 0.70  CREATININE 1.55*  --   --   --   --  1.23  --   --  1.31*  TROPONINI  --   --   --  0.93* 1.06* 1.28*  --   --   --    < > = values in this interval not displayed.    Estimated Creatinine Clearance: 66.1 mL/min (A) (by C-G formula based on SCr of 1.31 mg/dL (H)).   Medical History: Past Medical History:  Diagnosis Date  . Anginal pain ([place])   . Anxiety   . Arthritis    "hips, knees, thoracic back" (01/22/2017)  . CAP (community acquired pneumonia) 11/2016  . 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 COPD (chronic obstructive pulmonary disease) ([place])    "pearl study" pt thinks he is on Symbicort; pt noncompliant with COPD controller meds on/off due to financial reasons.  . Coronary atherosclerosis of unspecified type of vessel, native or graft 2005   MI, s/p PCI with stent (pt reports 20+ interventions in the past, most recent cat 6/08 showed patent stents).  Normal LV function.   Nuclear stress test NEG 8/09.  . Depression    ?bipolar dx by psychiatrist?  . Gouty arthritis    Dr. Amil Amen  . Heart murmur   . Hematochezia 09/2012   Hyperplastic polyp, diverticulosis, and internal hemorrhoids found on colonoscopy 10/2012  . History of hiatal hernia   . Hyperkalemia 01/29/2017   Kayexalate 30 given x 1.  . Hyperlipidemia    Hx of multiple statin intolerance  . Hypertension   . 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 Migraine    "none in the 2000s" (01/15/2017)  . Mitral valve prolapse ~ 1981  . Neuropathic pain of both feet 2015/2016  Vit B12 and A1c normal 10/2014  . Obesities, morbid ([place])   . On home oxygen therapy    "2L prn" (01/15/2017)  . OSA on CPAP    w/oxygen (01/15/2017).  Noncompliance 2019.  Dr. Halford Chessman to repeat split night sleep study as of 12/2017 (not done as of 03/03/18)  . Osteoarthrosis, unspecified whether generalized or localized, unspecified site    DDD  . PAF (paroxysmal atrial fibrillation) ([place]) 12/2016   Started in the context of resp illness/lots of bronchodilators.  Started eliquis and cardizem during hospitalization 01/2017 (Dr. Rayann Heman).  . Pre-diabetes 2016/17  . Prostate cancer metastatic to multiple sites [facility]) 09/2010; 2018   2012: Localized, high risk disease (Dr. Kimbrough/[place]):  s/p 5 wks ext bm rad + seed boost, as well as hormone blockade x 1 yr.  Biochemical recurrence 11/2015;  2018 imaging showed bone mets and lymph node involvement.  Palliative Lupron and zytiga 2018/19--good PSA response as of 09/2017 onc f/u. **  To start hospice 03/17/18  . Radiation    Pelvic--for prostate ca: 25 treatments/01/2011  . Seizure disorder ([facility]) 08-15-2011   Deja vu sensations: no w/u.  These stopped when neurontin 300mg  tid was started for a different reason.  . Status post chemotherapy    10/2010 thru 06/2011  . Tobacco dependence     still smoking as of fall 2018.    Medications:  Medications Prior to Admission  Medication Sig Dispense Refill Last Dose  . abiraterone acetate (ZYTIGA) 250 MG tablet Take 4 tablets (1,000 mg total) by mouth daily. Take on an empty stomach 1 hour before or 2 hours after a meal 120 tablet 0 unknown  . albuterol (PROAIR HFA) 108 (90 Base) MCG/ACT inhaler Inhale 2 puffs into the lungs every 6 (six) hours as needed for wheezing or shortness of breath. 18 g 3 05/08/2018 at Unknown time  . albuterol (PROVENTIL) (2.5 MG/3ML) 0.083% nebulizer solution Take 3 mLs (2.5 mg total) by nebulization every 4 (four) hours as needed for wheezing or shortness of breath. 225 mL 3 05/08/2018 at Unknown time  . allopurinol (ZYLOPRIM) 300 MG tablet Take 1 tablet (300 mg total) by mouth daily. 90 tablet 3 05/08/2018 at Unknown time  . apixaban (ELIQUIS) 5 MG TABS tablet Take 1 tablet (5 mg total) by mouth 2 (two) times daily. 180 tablet 1 05/08/2018 at Unknown time  . aspirin EC 81 MG EC tablet Take 1 tablet (81 mg total) by mouth daily. 30 tablet 0 05/08/2018 at Unknown time  . citalopram (CELEXA) 40 MG tablet Take 1 tablet (40 mg total) by mouth at bedtime.   05/08/2018 at Unknown time  . dexamethasone (DECADRON) 4 MG tablet Take 12 mg by mouth daily.  0 05/08/2018 at Unknown time  . Dextromethorphan-guaiFENesin (MUCINEX DM) 30-600 MG TB12 Take 1 tablet by mouth every 12 (twelve) hours as needed for cough or congestion.  3 05/08/2018 at Unknown time  . diltiazem (CARDIZEM) 60 MG tablet Take 1 tablet (60 mg total) by mouth every 12 (twelve) hours. (Patient taking differently: Take 30 mg by mouth 2 (two) times daily. ) 60 tablet 0 05/08/2018 at Unknown time  . docusate sodium (COLACE) 100 MG capsule Take 100-200 mg by mouth daily as needed for constipation.  3 05/08/2018 at Unknown time  . feeding supplement, ENSURE ENLIVE, (ENSURE ENLIVE) LIQD Take 237 mLs by mouth 2 (two) times daily between meals. (Patient taking  differently: Take 237 mLs by mouth See admin instructions. 237 mls --Three to  four times per day.) 237 mL 12 05/08/2018 at Unknown time  . furosemide (LASIX) 40 MG tablet Take 1 tablet (40 mg total) by mouth 2 (two) times daily. (Patient taking differently: Take 40 mg by mouth daily. ) 30 tablet 0 05/08/2018 at Unknown time  . gabapentin (NEURONTIN) 300 MG capsule Take 1 capsule (300 mg total) by mouth 2 (two) times daily. 30 capsule 0 05/08/2018 at Unknown time  . lisinopril (PRINIVIL,ZESTRIL) 10 MG tablet Take 10 mg by mouth daily.  1 05/08/2018 at Unknown time  . LORazepam (ATIVAN) 1 MG tablet Take 1 tablet (1 mg total) by mouth every 8 (eight) hours. 30 tablet 0 05/08/2018 at Unknown time  . morphine (MS CONTIN) 15 MG 12 hr tablet Take 15 mg by mouth every 8 (eight) hours.  0 05/08/2018 at Unknown time  . Oxycodone HCl 10 MG TABS Take 1 tablet (10 mg total) by mouth every 8 (eight) hours as needed. (Patient taking differently: Take 10-20 mg by mouth every 4 (four) hours as needed (pain). ) 90 tablet 0 05/08/2018 at Unknown time  . pantoprazole (PROTONIX) 40 MG tablet TAKE 1 TABLET BY MOUTH EVERY DAY (Patient taking differently: Take 40 mg by mouth daily. ) 30 tablet 6 05/08/2018 at Unknown time  . potassium chloride 20 MEQ TBCR Take 20 mEq by mouth 2 (two) times daily for 4 days. (Patient taking differently: Take 20 mEq by mouth daily. ) 8 tablet 0 05/08/2018 at Unknown time  . Probiotic Product (PROBIOTIC PO) Take 1 capsule by mouth daily.   05/08/2018 at Unknown time  . tamsulosin (FLOMAX) 0.4 MG CAPS capsule Take 1 capsule (0.4 mg total) by mouth daily after supper. (Patient taking differently: Take 0.4 mg by mouth as needed (urination). ) 30 capsule 3 05/08/2018 at Unknown time  . traMADol (ULTRAM) 50 MG tablet TAKE 1 TABLET BY MOUTH EVERY 6HRS AS NEEDED (Patient taking differently: Take 50 mg by mouth every 6 (six) hours as needed for moderate pain. ) 90 tablet 0 05/08/2018 at Unknown time  .  zolpidem (AMBIEN) 10 MG tablet Take 1 tablet (10 mg total) by mouth at bedtime as needed for sleep. 30 tablet 0 05/08/2018 at Unknown time  . bisoprolol (ZEBETA) 10 MG tablet Take 1 tablet (10 mg total) by mouth daily. (Patient not taking: Reported on 05/09/2018) 30 tablet 0 Not Taking at Unknown time  . budesonide-formoterol (SYMBICORT) 160-4.5 MCG/ACT inhaler Inhale 2 puffs into the lungs 2 (two) times daily. (Patient not taking: Reported on 03/03/2018) 3 Inhaler 3 Not Taking at Unknown time  . citalopram (CELEXA) 40 MG tablet Take 1 tablet (40 mg total) by mouth daily. (Patient not taking: Reported on 03/03/2018) 90 tablet 0 Not Taking at Unknown time  . colchicine 0.6 MG tablet Take 1 tablet (0.6 mg total) by mouth 2 (two) times daily as needed (gout flare ups). (Patient not taking: Reported on 05/09/2018) 60 tablet 6 Not Taking at Unknown time  . doxycycline (VIBRA-TABS) 100 MG tablet Take 1 tablet (100 mg total) by mouth every 12 (twelve) hours. (Patient not taking: Reported on 03/03/2018) 10 tablet 0 Not Taking at Unknown time  . Fluticasone-Umeclidin-Vilant (TRELEGY ELLIPTA) 100-62.5-25 MCG/INH AEPB Inhale 1 puff into the lungs daily. (Patient not taking: Reported on 05/09/2018) 28 each 5 Not Taking at Unknown time  . nicotine (NICODERM CQ - DOSED IN MG/24 HOURS) 14 mg/24hr patch Place 1 patch (14 mg total) onto the skin daily. (Patient not taking: Reported on 01/04/2018) 28  patch 0 Not Taking at Unknown time  . nystatin cream (MYCOSTATIN) Apply 1 application topically 2 (two) times daily. (Patient not taking: Reported on 03/03/2018) 30 g 1 Not Taking at Unknown time  . Oxycodone HCl 10 MG TABS Take 1-2 tablets (10-20 mg total) by mouth every 4 (four) hours as needed. (Patient not taking: Reported on 05/09/2018) 180 tablet 0 Not Taking at Unknown time  . potassium chloride SA (K-DUR,KLOR-CON) 20 MEQ tablet 1 tab po qd EVERY DAY (Patient not taking: Reported on 05/09/2018) 90 tablet 1 Not Taking at  Unknown time  . prochlorperazine (COMPAZINE) 10 MG tablet Take 1 tablet (10 mg total) by mouth every 6 (six) hours as needed for nausea or vomiting. (Patient not taking: Reported on 05/09/2018) 30 tablet 2 Not Taking at Unknown time  . urea (CARMOL) 40 % CREA Apply to right lower leg once daily (Patient not taking: Reported on 03/03/2018) 1 each 6 Not Taking at Unknown time   Scheduled:  . allopurinol  300 mg Oral Daily  . aspirin EC  81 mg Oral Daily  . bisoprolol  5 mg Oral Daily  . budesonide (PULMICORT) nebulizer solution  0.5 mg Nebulization BID  . citalopram  40 mg Oral Daily  . dexamethasone  12 mg Oral Daily  . docusate sodium  100-200 mg Oral BID  . doxycycline  100 mg Oral Q12H  . enalaprilat  0.625 mg Intravenous Q6H  . feeding supplement (ENSURE ENLIVE)  237 mL Oral BID BM  . furosemide  40 mg Oral BID  . gabapentin  300 mg Oral BID  . Influenza vac split quadrivalent PF  0.5 mL Intramuscular Tomorrow-1000  . morphine  15 mg Oral Q8H  . nitroGLYCERIN  0.5 inch Topical Q6H  . pantoprazole  40 mg Oral Daily   Infusions:  . heparin 1,200 Units/hr (05/10/18 1619)    Assessment: Duy was admitted today with resp distress. He was founded to have elevated troponin. Heparin was started in the ED but was supposed to be stopped due to the issue with his ischemic leg. A doppler was done which was negative. Patient to be continued on heparin for 48 hours per cards.   Heparin level this morning is 0.7. Will continue heparin at 1200 units/hr for now. Anticipate restart home apixaban soon.     Goal of Therapy:  Heparin level 0.3-0.7 units/ml Monitor platelets by anticoagulation protocol: Yes   Plan:  Continue heparin at 1200 units/hr Heparin level daily F/u restart of apixaban.  Monitor for bleeding  Kimiyo Carmicheal A. Levada Dy, PharmD, [place] Pager: [phone] Please utilize Amion for appropriate phone number to reach the unit pharmacist ([place])   Addendum:  Consult for apixaban received this AM. Will restart apixaban at 1000 and d/c heparin once first dose of apixaban is given  Updated Plan:  D/c heparin Give apixaban 5mg  PO BID Monitor for bleeding.

## 2018-05-11 NOTE — Progress Notes (Signed)
[facility]: [employer]  Pt is a current pt with our services. He was sent to the hospital due to increased SOB and Full Code wanting treatment. He is seen for prostate cancer with this being primary diagnosis with metastatic disease to the bone and lymph nodes. He is oxygen dependent at home. He reports breathing better today and also feeling better now that his pain management is better as well. He is more tired today and is having a hard time waking up but states he did not sleep good and that why.  His wife is working today. She will be in later after work to visit with him. We will continue to follow and resume the hospiceservices upon discharge home. Webb Silversmith RN [phone] Hospital Liaison.

## 2018-05-11 NOTE — Progress Notes (Signed)
PROGRESS NOTE                                                                                                                                                                                                             Patient Demographics:    Scott Rollins, is a 67 y.o. male, DOB - 01-20-1951, CXK:[id number]  Admit date - 05/09/2018   Admitting Physician Vianne Bulls, MD  Outpatient Primary MD for the patient is [name], [name], MD  LOS - 2  Chief Complaint  Patient presents with  . Respiratory Distress       Brief Narrative   Scott Rollins is a 67 y.o. 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    Subjective:   Patient in bed, appears comfortable, denies any headache, no fever, no chest pain or pressure, no shortness of breath , no abdominal pain. No focal weakness.    Assessment  & Plan :     1.  Acute on chronic hypoxic respiratory failure requiring BiPAP in the ER.  Due to acute on chronic diastolic CHF recent EF 97%.- he is currently on combination of aspirin, beta-blocker, Lasix, and we have added Nitropaste, much improved and currently no shortness of breath or rails, uses home oxygen which she will continue, switch to oral Lasix, repeat echo noted with wall motion abnormality, cardiology on board clinically better.  2.  Elevated but flat non-ACS pattern troponin.  Due to demand ischemia coming from #1 above, currently on heparin drip and beta-blocker, chest pain-free with EKG being  nonacute.  Defer continuation of heparin drip to cardiology.  Resumed home dose statin - ASA and B Blocker.  3.  HX of metastatic prostate cancer.  On home hospice, he is currently revoked hospice for hospitalization.  Long-term goal of care is medical management and comfort.  No heroics, however he wants full code while he is in the hospital.  Continue home dose Decadron.  4.  Right lower extremity wound.  No signs of surrounding cellulitis, this could be due to edema and venous engorgement.  Wound care, stopped broad-spectrum IV antibiotics and placed on oral  doxycycline.  His leukocytosis seems to be chronically elevated for a little while now could be due to malignancy.  Lower extremity venous duplex unremarkable, right lower extremity arterial ultrasound suggestive of 50 to 74% stenosis in the superficial femoral artery.  For now continue aspirin and statin for secondary prevention, poor operative candidate due to metastatic prostate cancer with home hospice status, outpatient vascular surgery follow-up to see if minimally invasive surgery or stenting could be an option.  Patient is currently pain-free.  5.  Smoking.  Counseled to quit.  6.  OSA.  BiPAP nightly.  7.  Paroxysmal atrial fibrillation [place] vas 2 score of at least 3.  Resume home dose Eliquis and beta-blocker.  8.  Dyslipidemia.  Resume home dose statin.  9.  History of gout.  Resume home dose allopurinol and colchicine.  10.  Chronic pain.  Home medications resumed.  11.  ARF.  Mild likely due to scheduled Vasotec which has been discontinued, skip morning oral diuretic and repeat BMP tomorrow.  Good urine output.    Family Communication  :  None  Code Status :  Full  Disposition Plan  :  Tele  Consults  :  Cards  Procedures  :    TTE  - Left ventricle: Poor acoustic windows limit study, even with Definity use. Ovreall LVEF is approxiimalely 55% with basal   inferior, basal inferolateral hypokinesis. The cavity size  was normal. Wall thickness was increased in a pattern of mild LVH.   Systolic function was normal. The estimated ejection fraction was in the range of 55% to 60%. - Mitral valve: Calcified annulus.  [place] Leg - Right: There is no evidence of deep vein thrombosis in the lower extremity. No cystic structure found in the popliteal fossa. Left: There is no evidence of deep vein thrombosis in the lower extremity. No cystic structure found in the popliteal fossa.  [place] Leg - Arterial -  Right: 50-74% stenosis noted in the superficial femoral artery.  DVT Prophylaxis  :  Eliquis  Lab Results  Component Value Date   PLT 168 05/11/2018    Diet :  Diet Order            Diet Heart Room service appropriate? Yes; Fluid consistency: Thin; Fluid restriction: 1500 mL Fluid  Diet effective now               Inpatient Medications Scheduled Meds: . allopurinol  300 mg Oral Daily  . apixaban  5 mg Oral BID  . aspirin EC  81 mg Oral Daily  . bisoprolol  5 mg Oral Daily  . budesonide (PULMICORT) nebulizer solution  0.5 mg Nebulization BID  . citalopram  40 mg Oral Daily  . dexamethasone  12 mg Oral Daily  . docusate sodium  100-200 mg Oral BID  . doxycycline  100 mg Oral Q12H  . feeding supplement (ENSURE ENLIVE)  237 mL Oral BID BM  . furosemide  40 mg Oral BID  . gabapentin  300 mg Oral BID  . morphine  15 mg Oral Q8H  . nitroGLYCERIN  0.5 inch Topical Q6H  . pantoprazole  40 mg Oral Daily   Continuous Infusions:  PRN Meds:.acetaminophen **OR** [DISCONTINUED] acetaminophen, albuterol, colchicine, LORazepam, [DISCONTINUED] ondansetron **OR** ondansetron (ZOFRAN) IV, oxyCODONE, tamsulosin  Antibiotics  :   Anti-infectives (From admission, onward)   Start     Dose/Rate Route Frequency Ordered Stop   05/10/18 1400  vancomycin (VANCOCIN) 1,500 mg in sodium chloride 0.9 %  500 mL IVPB  Status:  Discontinued     1,500 mg 250 mL/hr over 120 Minutes Intravenous Every 24 hours 05/09/18 1459  05/10/18 1124   05/10/18 1130  doxycycline (VIBRA-TABS) tablet 100 mg     100 mg Oral Every 12 hours 05/10/18 1124     05/09/18 0830  vancomycin (VANCOCIN) IVPB 1000 mg/200 mL premix     1,000 mg 200 mL/hr over 60 Minutes Intravenous Every 1 hr x 2 05/09/18 0826 05/09/18 1029   05/09/18 0815  piperacillin-tazobactam (ZOSYN) IVPB 3.375 g  Status:  Discontinued     3.375 g 100 mL/hr over 30 Minutes Intravenous Every 8 hours 05/09/18 0812 05/10/18 1124          Objective:   Vitals:   05/10/18 1955 05/10/18 2018 05/11/18 0533 05/11/18 0831  BP:  138/79 133/85   Pulse:  72 71 73  Resp:  16 16 16   Temp:  98.8 F (37.1 C) 98.4 F (36.9 C)   TempSrc:      SpO2: 96% 100% 98% 99%  Weight:   105.3 kg     Wt Readings from Last 3 Encounters:  05/11/18 105.3 kg  03/11/18 104.9 kg  01/04/18 117.9 kg     Intake/Output Summary (Last 24 hours) at 05/11/2018 1024 Last data filed at 05/11/2018 0537 Gross per 24 hour  Intake 143.21 ml  Output 4500 ml  Net -4356.79 ml     Physical Exam  Awake Alert, Oriented X 3, No new F.N deficits, Normal affect [place].AT,PERRAL Supple Neck,No JVD, No cervical lymphadenopathy appriciated.  Symmetrical Chest wall movement, Good air movement bilaterally, CTAB RRR,No Gallops, Rubs or new Murmurs, No Parasternal Heave +ve B.Sounds, Abd Soft, No tenderness, No organomegaly appriciated, No rebound - guarding or rigidity. No Cyanosis, +ve 1-2+ leg  edema, right lower extremity wound on the lateral aspect and a bandage, no surrounding cellulitis    Data Review:    CBC Recent Labs  Lab 05/09/18 0440 05/10/18 0536 05/11/18 0544  WBC 15.7* 15.1* 12.4*  HGB 14.8 14.4 14.2  HCT 48.7 45.6 45.7  PLT 187 185 168  MCV 92.2 90.1 92.1  MCH 28.0 28.5 28.6  MCHC 30.4 31.6 31.1  RDW 15.5 15.4 15.1  LYMPHSABS 1.4 1.0  --   MONOABS 0.6 0.8  --   EOSABS 0.0 0.0  --   BASOSABS 0.1 0.0  --     Chemistries  Recent Labs  Lab 05/09/18 0440 05/10/18 0536  05/11/18 0544  NA 138 139 141  K 4.4 4.1 4.2  CL 97* 95* 91*  CO2 30 33* 41*  GLUCOSE 134* 125* 123*  BUN 50* 55* 61*  CREATININE 1.55* 1.23 1.31*  CALCIUM 9.0 8.8* 8.9  MG 2.1 2.2 2.2  AST  --  30  --   ALT  --  33  --   ALKPHOS  --  48  --   BILITOT  --  0.7  --    ------------------------------------------------------------------------------------------------------------------ No results for input(s): CHOL, HDL, LDLCALC, TRIG, CHOLHDL, LDLDIRECT in the last 72 hours.  Lab Results  Component Value Date   HGBA1C 5.3 01/15/2017   ------------------------------------------------------------------------------------------------------------------ No results for input(s): TSH, T4TOTAL, T3FREE, THYROIDAB in the last 72 hours.  Invalid input(s): FREET3 ------------------------------------------------------------------------------------------------------------------ No results for input(s): VITAMINB12, FOLATE, FERRITIN, TIBC, IRON, RETICCTPCT in the last 72 hours.  Coagulation profile Recent Labs  Lab 05/09/18 1005 05/09/18 1136  INR SPECIMEN CLOTTED 0.92    No results for input(s): DDIMER in  the last 72 hours.  Cardiac Enzymes Recent Labs  Lab 05/09/18 1136 05/09/18 2132 05/10/18 0536  TROPONINI 0.93* 1.06* 1.28*   ------------------------------------------------------------------------------------------------------------------    Component Value Date/Time   BNP 704.7 (H) 05/09/2018 0440    Micro Results Recent Results (from the past 240 hour(s))  MRSA PCR Screening     Status: None   Collection Time: 05/09/18  8:15 PM  Result Value Ref Range Status   MRSA by PCR NEGATIVE NEGATIVE Final    Comment:        The GeneXpert MRSA Assay (FDA approved for NASAL specimens only), is one component of a comprehensive MRSA colonization surveillance program. It is not intended to diagnose MRSA infection nor to guide or monitor treatment for MRSA infections. Performed  at [facility], [place] [address] [place],  [address]     Radiology Reports Ct Head Wo Contrast  Result Date: 05/09/2018 CLINICAL DATA:  Altered mental status. EXAM: CT HEAD WITHOUT CONTRAST TECHNIQUE: Contiguous axial images were obtained from the base of the skull through the vertex without intravenous contrast. COMPARISON:  Report 09/24/2002 FINDINGS: Brain: Age related involutional changes of the brain with moderate appearing small vessel ischemic disease of periventricular white matter. No large vascular territory infarct, hemorrhage, midline shift or edema. No intra-axial mass nor extra-axial fluid. Midline fourth ventricle and basal cisterns. Brainstem cell [place]rum are nonacute. Vascular: No hyperdense vessel sign. Skull: No acute skull fracture or suspicious osseous lesions. Sinuses/Orbits: Nonacute Other: None IMPRESSION: 1. Moderate small vessel ischemic disease of periventricular white matter, likely chronic given previous description in 2004. 2. No acute intracranial abnormality. Electronically Signed   By: Ashley Royalty M.D.   On: 05/09/2018 14:06   Dg Chest Port 1 View  Result Date: 05/09/2018 CLINICAL DATA:  67 year old male with shortness of breath. EXAM: PORTABLE CHEST 1 VIEW COMPARISON:  Chest radiograph dated 03/03/2018 FINDINGS: Bibasilar interstitial coarsening/scarring similar to prior radiograph. No focal consolidation, pleural effusion, or pneumothorax. Stable cardiomegaly. No acute osseous pathology. IMPRESSION: No focal consolidation.  No significant interval change. Electronically Signed   By: Anner Crete M.D.   On: 05/09/2018 05:11   Dg Ankle Right Port  Result Date: 05/09/2018 CLINICAL DATA:  Right lower extremity swelling/redness EXAM: PORTABLE RIGHT ANKLE - 2 VIEW COMPARISON:  None. FINDINGS: No fracture or dislocation is seen. The ankle mortise is intact. Soft tissue ulceration/wound along the lateral aspect of the distal fibular shaft. No underlying  cortical irregularity. IMPRESSION: And soft tissues recent/wound along the lateral aspect of the distal fibular shaft. No findings to suggest osteomyelitis.  No acute osseous abnormality. Electronically Signed   By: Julian Hy M.D.   On: 05/09/2018 10:36   Dg Foot 2 Views Right  Result Date: 05/09/2018 CLINICAL DATA:  Right lower extremity redness/swelling EXAM: RIGHT FOOT - 2 VIEW COMPARISON:  None. FINDINGS: No fracture or dislocation is seen. The joint spaces are preserved. Mild dorsal soft tissue swelling overlying the midfoot. Tiny plantar calcaneal enthesophyte. IMPRESSION: Mild dorsal soft tissue swelling overlying the midfoot. No acute osseus abnormality is seen. Electronically Signed   By: Julian Hy M.D.   On: 05/09/2018 10:36    Time Spent in minutes  30   Lala Lund M.D on 05/11/2018 at 10:24 AM  To page go to [email or web address] - password [facility]

## 2018-05-11 NOTE — Evaluation (Signed)
Occupational Therapy Evaluation Patient Details Name: Scott Rollins MRN: [id number] DOB: Jul 08, 1951 Today's Date: 05/11/2018    History of Present Illness Pt is a 67 y.o. 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    Clinical Impression   This 67 y/o male presents with the above. Pt with some difficulty providing PLOF, though reports he is mod independent with functional mobility, was receiving assist from spouse/daughter for ADLs. Pt presenting with impaired cognition, generalized weakness and decreased dynamic balance impacting his functional performance. Pt currently requires minA+2 for stand pivot transfers using RW; requires minA for UB ADL; maxA for LB ADLs. Pt requiring significant time for processing/responding to questions and following simple commands this session. Pt will benefit from continued OT services; recommend follow up therapy services in SNF setting after discharge unless hospice/family is able to provide necessary 24hr supervision/assist. Will continue to follow acutely.     Follow Up Recommendations  SNF;Supervision/Assistance - 24 hour(unless hospice/family able to provide necessary 24hr assist)    Equipment Recommendations  None recommended by OT;Other (comment)(to be further assessed)           Precautions / Restrictions Precautions Precautions: Fall Restrictions Weight Bearing Restrictions: No      Mobility Bed Mobility Overal bed mobility: Needs Assistance Bed Mobility: Supine to  Sit     Supine to sit: Max assist;+2 for safety/equipment     General bed mobility comments: assist to intiate LEs over EOB and to elevate trunk; cues/assist to scoot hips towards EOB  Transfers Overall transfer level: Needs assistance Equipment used: Rolling walker (2 wheeled) Transfers: Sit to/from [facility] Sit to Stand: Min assist;+2 physical assistance;+2 safety/equipment Stand pivot transfers: Min assist;+2 physical assistance;+2 safety/equipment       General transfer comment: assist to boost into standing and steady at RW; steadying assist to take pivotal steps towards recliner with assist for maneuvering RW; VCs for safe hand placement    Balance Overall balance assessment: Needs assistance Sitting-balance support: Feet supported Sitting balance-Leahy Scale: Fair     Standing balance support: Bilateral upper extremity supported Standing balance-Leahy Scale: Poor                             ADL either performed or assessed with clinical judgement   ADL Overall ADL's : Needs assistance/impaired Eating/Feeding: Set up;Sitting   Grooming: Set up;Min guard;Sitting   Upper Body Bathing: Minimal assistance;Sitting   Lower Body Bathing: Moderate assistance;Sit to/from stand;+2 for safety/equipment   Upper Body Dressing : Minimal assistance;Sitting   Lower Body Dressing: Maximal assistance;+2 for safety/equipment;+2 for physical assistance;Sit to/from stand Lower Body Dressing Details (indicate cue type and reason): minA+2 standing balance Toilet Transfer: Minimal assistance;+2 for physical assistance;+2 for safety/equipment;Stand-pivot;BSC;RW Toilet Transfer Details (indicate cue type and reason): simulated in transfer to [place] and Hygiene: Maximal assistance;Sit to/from stand;+2 for safety/equipment       Functional mobility during ADLs: Minimal assistance;+2 for physical assistance;+2 for  safety/equipment;Rolling walker(stand pivot transfer)       Vision Baseline Vision/History: Wears glasses Wears Glasses: At all times  Perception     Praxis      Pertinent Vitals/Pain Pain Assessment: Faces Faces Pain Scale: Hurts little more Pain Location: RLE with certain movements Pain Descriptors / Indicators: Grimacing;Guarding Pain Intervention(s): Limited activity within patient's tolerance;Monitored during session;Repositioned     Hand Dominance     Extremity/Trunk Assessment Upper Extremity Assessment Upper Extremity Assessment: Generalized weakness   Lower Extremity Assessment Lower Extremity Assessment: Defer to PT evaluation       Communication Communication Communication: No difficulties   Cognition Arousal/Alertness: Awake/alert;Lethargic(intermittently lethargic) Behavior During Therapy: Flat affect Overall Cognitive Status: No family/caregiver present to determine baseline cognitive functioning                                 General Comments: pt intermittently with significant delay when responding to questions or following commands; requires repetition and multimodal cues   General Comments  verbal clearance from MD for okay to mobilize OOB    Exercises     Shoulder Instructions      Home Living Family/patient expects to be discharged to:: Private residence Living Arrangements: Spouse/significant other;Children Available Help at Discharge: Family Type of Home: House Home Access: Stairs to enter     Home Layout: Multi-level     Bathroom Shower/Tub: Teaching laboratory technician Toilet: Handicapped height     Home Equipment: Environmental consultant - 2 wheels;Cane - single point;Shower seat;Walker - 4 wheels;Crutches;Other (comment);Adaptive equipment   Additional Comments: PLOF partially obtained via chart review due to pt having difficulty answering questions; information pt provided this session is consistent with previous  information       Prior Functioning/Environment Level of Independence: Needs assistance  Gait / Transfers Assistance Needed: using RW for mobility ADL's / Homemaking Assistance Needed: reports spouse/daughter assist with ADLs            OT Problem List: Decreased strength;Decreased range of motion;Decreased activity tolerance;Decreased safety awareness;Impaired balance (sitting and/or standing)      OT Treatment/Interventions: Self-care/ADL training;Therapeutic activities;Patient/family education;Balance training;Therapeutic exercise    OT Goals(Current goals can be found in the care plan section) Acute Rehab OT Goals Patient Stated Goal: none stated OT Goal Formulation: With patient Time For Goal Achievement: 05/25/18 Potential to Achieve Goals: Fair  OT Frequency: Min 2X/week   Barriers to D/C:            Co-evaluation PT/OT/SLP Co-Evaluation/Treatment: Yes Reason for Co-Treatment: Complexity of the patient's impairments (multi-system involvement);Necessary to address cognition/behavior during functional activity;For patient/therapist safety;To address functional/ADL transfers   OT goals addressed during session: ADL's and self-care;Proper use of Adaptive equipment and DME      AM-PAC PT "6 Clicks" Daily Activity     Outcome Measure Help from another person eating meals?: A Little Help from another person taking care of personal grooming?: A Little Help from another person toileting, which includes using toliet, bedpan, or urinal?: A Lot Help from another person bathing (including washing, rinsing, drying)?: A Lot Help from another person to put on and taking off regular upper body clothing?: A Little Help from another person to put on and taking off regular lower body clothing?: A Lot 6 Click Score: 15   End of Session Equipment Utilized During Treatment: Rolling walker Nurse Communication: Mobility status  Activity Tolerance: Patient tolerated treatment  well Patient left: in chair;with call bell/phone within reach;with chair alarm set  OT Visit Diagnosis: Unsteadiness on feet (R26.81);Muscle weakness (generalized) (M62.81)  Time: 1610-1640 OT Time Calculation (min): 30 min Charges:  OT General Charges $OT Visit: 1 Visit OT Evaluation $OT Eval Moderate Complexity: 1 Mod  Lou Cal, OT [employer] Pager [phone] Office [phone]   Raymondo Band 05/11/2018, 5:19 PM

## 2018-05-11 NOTE — Consult Note (Signed)
   [facility] CM Inpatient Consult   05/11/2018  Scott Rollins 1950/09/03 [id number]   Patient assessed for extreme high risk for unplanned readmissions.  Chart review reveals that this patient is active with [facility].  This Probation officer confirmed with Cheri from [place].  No current [facility] Care Management needs assessed.  For questions please contact:  Natividad Brood, RN BSN [facility] Liaison  [phone] business mobile phone Toll free office [phone]

## 2018-05-11 NOTE — Progress Notes (Signed)
Progress Note  Patient Name: Scott Rollins Date of Encounter: 05/11/2018  Primary Cardiologist: Dorris Carnes, MD   Subjective   Feeling better.  Breathing improved but feeling tired.  Inpatient Medications    Scheduled Meds: . allopurinol  300 mg Oral Daily  . apixaban  5 mg Oral BID  . aspirin EC  81 mg Oral Daily  . bisoprolol  5 mg Oral Daily  . budesonide (PULMICORT) nebulizer solution  0.5 mg Nebulization BID  . citalopram  40 mg Oral Daily  . dexamethasone  12 mg Oral Daily  . docusate sodium  100-200 mg Oral BID  . doxycycline  100 mg Oral Q12H  . feeding supplement (ENSURE ENLIVE)  237 mL Oral BID BM  . furosemide  40 mg Oral BID  . gabapentin  300 mg Oral BID  . morphine  15 mg Oral Q8H  . nitroGLYCERIN  0.5 inch Topical Q6H  . pantoprazole  40 mg Oral Daily   Continuous Infusions:  PRN Meds: acetaminophen **OR** [DISCONTINUED] acetaminophen, albuterol, colchicine, LORazepam, [DISCONTINUED] ondansetron **OR** ondansetron (ZOFRAN) IV, oxyCODONE, tamsulosin   Vital Signs    Vitals:   05/10/18 1955 05/10/18 2018 05/11/18 0533 05/11/18 0831  BP:  138/79 133/85   Pulse:  72 71 73  Resp:  16 16 16   Temp:  98.8 F (37.1 C) 98.4 F (36.9 C)   TempSrc:      SpO2: 96% 100% 98% 99%  Weight:   105.3 kg     Intake/Output Summary (Last 24 hours) at 05/11/2018 1134 Last data filed at 05/11/2018 0537 Gross per 24 hour  Intake 143.21 ml  Output 4500 ml  Net -4356.79 ml   Filed Weights   05/09/18 0800 05/10/18 0838 05/11/18 0533  Weight: 104 kg 106.7 kg 105.3 kg    Telemetry    Sinus rhythm - Personally Reviewed  ECG    Sinus rhythm.  Rate 76 bpm.  Early R wave progression.  - Personally Reviewed  Physical Exam   VS:  BP 133/85 (BP Location: Left Arm)   Pulse 73   Temp 98.4 F (36.9 C)   Resp 16   Wt 105.3 kg   SpO2 99%   BMI 32.84 kg/m  , BMI Body mass index is 32.84 kg/m. GENERAL: Chronically ill-appearing HEENT: Pupils equal round  and reactive, fundi not visualized, oral mucosa unremarkable NECK:  No jugular venous distention, waveform within normal limits, carotid upstroke brisk and symmetric, no bruits LUNGS:  Clear to auscultation bilaterally HEART:  RRR.  PMI not displaced or sustained,S1 and S2 within normal limits, no S3, no S4, no clicks, no rubs, no murmurs ABD:  Flat, positive bowel sounds normal in frequency in pitch, no bruits, no rebound, no guarding, no midline pulsatile mass, no hepatomegaly, no splenomegaly EXT:  2 plus pulses throughout, no edema, no cyanosis no clubbing SKIN:  No rashes no nodules NEURO:  Cranial nerves II through XII grossly intact, motor grossly intact throughout [facility]:  Cognitively intact, oriented to person place and time    Labs    Chemistry Recent Labs  Lab 05/09/18 0440 05/10/18 0536 05/11/18 0544  NA 138 139 141  K 4.4 4.1 4.2  CL 97* 95* 91*  CO2 30 33* 41*  GLUCOSE 134* 125* 123*  BUN 50* 55* 61*  CREATININE 1.55* 1.23 1.31*  CALCIUM 9.0 8.8* 8.9  PROT  --  5.3*  --   ALBUMIN  --  2.7*  --   AST  --  30  --   ALT  --  33  --   ALKPHOS  --  48  --   BILITOT  --  0.7  --   GFRNONAA 44* 59* 54*  GFRAA 51* >60 >60  ANIONGAP 11 11 9      Hematology Recent Labs  Lab 05/09/18 0440 05/10/18 0536 05/11/18 0544  WBC 15.7* 15.1* 12.4*  RBC 5.28 5.06 4.96  HGB 14.8 14.4 14.2  HCT 48.7 45.6 45.7  MCV 92.2 90.1 92.1  MCH 28.0 28.5 28.6  MCHC 30.4 31.6 31.1  RDW 15.5 15.4 15.1  PLT 187 185 168    Cardiac Enzymes Recent Labs  Lab 05/09/18 1136 05/09/18 2132 05/10/18 0536  TROPONINI 0.93* 1.06* 1.28*    Recent Labs  Lab 05/09/18 0514  TROPIPOC 0.86*     BNP Recent Labs  Lab 05/09/18 0440  BNP 704.7*     DDimer No results for input(s): DDIMER in the last 168 hours.   Radiology    Ct Head Wo Contrast  Result Date: 05/09/2018 CLINICAL DATA:  Altered mental status. EXAM: CT HEAD WITHOUT CONTRAST TECHNIQUE: Contiguous axial images were  obtained from the base of the skull through the vertex without intravenous contrast. COMPARISON:  Report 09/24/2002 FINDINGS: Brain: Age related involutional changes of the brain with moderate appearing small vessel ischemic disease of periventricular white matter. No large vascular territory infarct, hemorrhage, midline shift or edema. No intra-axial mass nor extra-axial fluid. Midline fourth ventricle and basal cisterns. Brainstem cell Byrum are nonacute. Vascular: No hyperdense vessel sign. Skull: No acute skull fracture or suspicious osseous lesions. Sinuses/Orbits: Nonacute Other: None IMPRESSION: 1. Moderate small vessel ischemic disease of periventricular white matter, likely chronic given previous description in 2004. 2. No acute intracranial abnormality. Electronically Signed   By: Ashley Royalty M.D.   On: 05/09/2018 14:06    Cardiac Studies   Echo 05/10/18: Study Conclusions  - Left ventricle: Poor acoustic windows limit study, even with   Definity use. Ovreall LVEF is approxiimalely 55% with basal   inferior, basal inferolateral hypokinesis. The cavity size was   normal. Wall thickness was increased in a pattern of mild LVH.   Systolic function was normal. The estimated ejection fraction was   in the range of 55% to 60%. - Mitral valve: Calcified annulus.  LE Dopplers 05/10/18: Negative for DVT.   Patient Profile     67 y.o. male with metastatic prostate cancer, hypertension, CAD status post prior PCI, paroxysmal atrial fibrillation, multifocal atrial tachycardia, COPD, admitted with hypercarbic respiratory failure and cellulitis.  He also had acute on chronic diastolic heart failure.  Assessment & Plan    # Acute on chronic diastolic heart failure:  BNP was elevated to 704.  He is net -4.5L.  Renal function starting to climb despite switching to oral lasix.  Would hold the rest of the day and resume home dosing tomorrow.  Check BMP in 1 week.  Echo showed that his systolic  function was preserved but he had some hypokinesis of the basal inferior and inferolateral myocardium.  Given that he is on hospice and not having any chest pain there is no plan for an invasive ischemic evaluation at this time.    # Elevated troponin: # CAD s/p PCI: Troponin elevated to 1.28.  Mr. Leverich reports that he has not had any issues with chest pain prior to this admission.  It is likely that the chest discomfort he was experiencing was related to demand ischemia  and volume overload and the pain seems to have resolved.  He does not want any aggressive interventions given that he is on hospice.  Continue bisoprolol.  If he has recurrent chest pain we can add long acting nitrates.  Continue heparin x48 hours then resume his home Eliquis.   # Hypertension: Continue bisoprolol and restart his home diltiazem.  Lisinopril remains on hold.  We will check a basic metabolic panel in 1 week.  If his renal function has improved and his blood pressure is elevated this can be restarted as an outpatient.  # Paroxysmal atrial fibrillation:  Currently in sinus rhythm.  Resume home diltiazem 30 mg twice daily.  Okay to stop heparin and resume home Eliquis.        For questions or updates, please contact [place] Please consult [email or web address] for contact info under     [place] will sign off.   Medication Recommendations:  Resume diltiazem and Eliquis Other recommendations (labs, testing, etc):  Check BMP in 1 week Follow up as an outpatient:  Given that he is going back home on hospice will not arrange outpatient follow up.  We would be happy to see him any time if he chooses.      Signed, Skeet Latch, MD  05/11/2018, 11:34 AM

## 2018-05-12 ENCOUNTER — Inpatient Hospital Stay

## 2018-05-12 ENCOUNTER — Inpatient Hospital Stay: Admitting: Oncology

## 2018-05-12 DIAGNOSIS — I2583 Coronary atherosclerosis due to lipid rich plaque: Secondary | ICD-10-CM

## 2018-05-12 DIAGNOSIS — E782 Mixed hyperlipidemia: Secondary | ICD-10-CM

## 2018-05-12 DIAGNOSIS — I2510 Atherosclerotic heart disease of native coronary artery without angina pectoris: Secondary | ICD-10-CM

## 2018-05-12 LAB — CBC
HEMATOCRIT: 49.7 % (ref 39.0–52.0)
Hemoglobin: 14.8 g/dL (ref 13.0–17.0)
MCH: 28.1 pg (ref 26.0–34.0)
MCHC: 29.8 g/dL — AB (ref 30.0–36.0)
MCV: 94.5 fL (ref 80.0–100.0)
Platelets: 192 10*3/uL (ref 150–400)
RBC: 5.26 MIL/uL (ref 4.22–5.81)
RDW: 15 % (ref 11.5–15.5)
WBC: 13.4 10*3/uL — AB (ref 4.0–10.5)
nRBC: 0 % (ref 0.0–0.2)

## 2018-05-12 LAB — BASIC METABOLIC PANEL
Anion gap: 8 (ref 5–15)
BUN: 60 mg/dL — AB (ref 8–23)
CO2: 44 mmol/L — AB (ref 22–32)
CREATININE: 1.13 mg/dL (ref 0.61–1.24)
Calcium: 9.3 mg/dL (ref 8.9–10.3)
Chloride: 90 mmol/L — ABNORMAL LOW (ref 98–111)
GFR calc Af Amer: >60 mL/min (ref >60)
GFR calc non Af Amer: >60 mL/min (ref >60)
Glucose, Bld: 85 mg/dL (ref 70–99)
Potassium: 4.7 mmol/L (ref 3.5–5.1)
SODIUM: 142 mmol/L (ref 135–145)

## 2018-05-12 MED ORDER — DILTIAZEM HCL 60 MG PO TABS: 60.0000 mg | ORAL_TABLET | Freq: Two times a day (BID) | ORAL | Status: DC

## 2018-05-12 MED ORDER — DILTIAZEM HCL 60 MG PO TABS
30.0000 mg | ORAL_TABLET | Freq: Once | ORAL | Status: AC
  Administered 2018-05-12: 30 mg via ORAL

## 2018-05-12 MED ORDER — METOLAZONE 2.5 MG PO TABS: ORAL_TABLET | ORAL | 0 refills | Status: AC

## 2018-05-12 MED ORDER — DILTIAZEM HCL 25 MG/5ML IV SOLN
10.0000 mg | INTRAVENOUS | Status: AC
  Administered 2018-05-12: 10 mg via INTRAVENOUS
  Filled 2018-05-12: qty 5

## 2018-05-12 MED ORDER — LISINOPRIL 5 MG PO TABS: 5.0000 mg | ORAL_TABLET | Freq: Every day | ORAL | 0 refills | Status: AC

## 2018-05-12 NOTE — Progress Notes (Signed)
CSW received consult regarding PT recommendation of SNF at discharge.  Patient will be discharging home with the resumption of Hospice Services.   CSW signing off as no further needs present.   Percell Locus Jaime Dome LCSW [phone]

## 2018-05-12 NOTE — Discharge Instructions (Signed)
Follow with Primary MD [name], [name], MD in 7 days   Get CBC, CMP, 2 view Chest X ray -  checked  by Primary MD or SNF MD in 5-7 days    Activity: As tolerated with Full fall precautions use walker/cane & assistance as needed  Disposition Home with Hospice  Diet: Heart Healthy with 1.5 L/day total fluid restriction  Special Instructions: If you have smoked or chewed Tobacco  in the last 2 yrs please stop smoking, stop any regular Alcohol  and or any Recreational drug use.  On your next visit with your primary care physician please Get Medicines reviewed and adjusted.  Please request your Prim.MD to go over all Hospital Tests and Procedure/Radiological results at the follow up, please get all Hospital records sent to your Prim MD by signing hospital release before you go home.  If you experience worsening of your admission symptoms, develop shortness of breath, life threatening emergency, suicidal or homicidal thoughts you must seek medical attention immediately by calling 911 or calling your MD immediately  if symptoms less severe.  You Must read complete instructions/literature along with all the possible adverse reactions/side effects for all the Medicines you take and that have been prescribed to you. Take any new Medicines after you have completely understood and accpet all the possible adverse reactions/side effects.   Information on my medicine - ELIQUIS (apixaban)  This medication education was reviewed with me or my healthcare representative as part of my discharge preparation.    Why was Eliquis prescribed for you? Eliquis was prescribed for you to reduce the risk of a blood clot forming that can cause a stroke if you have a medical condition called atrial fibrillation (a type of irregular heartbeat).  What do You need to know about Eliquis ? Take your Eliquis TWICE DAILY - one tablet in the morning and one tablet in the evening with or without food. If you have  difficulty swallowing the tablet whole please discuss with your pharmacist how to take the medication safely.  Take Eliquis exactly as prescribed by your doctor and DO NOT stop taking Eliquis without talking to the doctor who prescribed the medication.  Stopping may increase your risk of developing a stroke.  Refill your prescription before you run out.  After discharge, you should have regular check-up appointments with your healthcare provider that is prescribing your Eliquis.  In the future your dose may need to be changed if your kidney function or weight changes by a significant amount or as you get older.  What do you do if you miss a dose? If you miss a dose, take it as soon as you remember on the same day and resume taking twice daily.  Do not take more than one dose of ELIQUIS at the same time to make up a missed dose.  Important Safety Information A possible side effect of Eliquis is bleeding. You should call your healthcare provider right away if you experience any of the following: ? Bleeding from an injury or your nose that does not stop. ? Unusual colored urine (red or dark brown) or unusual colored stools (red or black). ? Unusual bruising for unknown reasons. ? A serious fall or if you hit your head (even if there is no bleeding).  Some medicines may interact with Eliquis and might increase your risk of bleeding or clotting while on Eliquis. To help avoid this, consult your healthcare provider or pharmacist prior to using any new prescription or  non-prescription medications, including herbals, vitamins, non-steroidal anti-inflammatory drugs (NSAIDs) and supplements.  This website has more information on Eliquis (apixaban): http://www.eliquis.com/eliquis/home

## 2018-05-12 NOTE — Progress Notes (Signed)
Nsg Discharge Note  Admit Date:  05/09/2018 Discharge date: 05/12/2018   Philis Fendt to be D/C'd Home with hospice per MD order.  AVS completed.  Copy for chart, and copy for patient signed, and dated. Patient/caregiver able to verbalize understanding.  Discharge Medication: Allergies as of 05/12/2018      Reactions   Amitriptyline Hcl Nausea Only   REACTION: nausea, elevated bp   Ezetimibe Other (See Comments)   REACTION: chest pain, fatigue      Medication List    STOP taking these medications   doxycycline 100 MG tablet Commonly known as:  VIBRA-TABS     TAKE these medications   abiraterone acetate 250 MG tablet Commonly known as:  ZYTIGA Take 4 tablets (1,000 mg total) by mouth daily. Take on an empty stomach 1 hour before or 2 hours after a meal   albuterol 108 (90 Base) MCG/ACT inhaler Commonly known as:  PROVENTIL HFA;VENTOLIN HFA Inhale 2 puffs into the lungs every 6 (six) hours as needed for wheezing or shortness of breath.   albuterol (2.5 MG/3ML) 0.083% nebulizer solution Commonly known as:  PROVENTIL Take 3 mLs (2.5 mg total) by nebulization every 4 (four) hours as needed for wheezing or shortness of breath.   allopurinol 300 MG tablet Commonly known as:  ZYLOPRIM Take 1 tablet (300 mg total) by mouth daily.   apixaban 5 MG Tabs tablet Commonly known as:  ELIQUIS Take 1 tablet (5 mg total) by mouth 2 (two) times daily.   aspirin 81 MG EC tablet Take 1 tablet (81 mg total) by mouth daily.   bisoprolol 10 MG tablet Commonly known as:  ZEBETA Take 1 tablet (10 mg total) by mouth daily.   budesonide-formoterol 160-4.5 MCG/ACT inhaler Commonly known as:  SYMBICORT Inhale 2 puffs into the lungs 2 (two) times daily.   citalopram 40 MG tablet Commonly known as:  CELEXA Take 1 tablet (40 mg total) by mouth at bedtime.   citalopram 40 MG tablet Commonly known as:  CELEXA Take 1 tablet (40 mg total) by mouth daily.   colchicine 0.6 MG  tablet Take 1 tablet (0.6 mg total) by mouth 2 (two) times daily as needed (gout flare ups).   dexamethasone 4 MG tablet Commonly known as:  DECADRON Take 12 mg by mouth daily.   diltiazem 60 MG tablet Commonly known as:  CARDIZEM Take 1 tablet (60 mg total) by mouth every 12 (twelve) hours. What changed:    how much to take  when to take this   docusate sodium 100 MG capsule Commonly known as:  COLACE Take 100-200 mg by mouth daily as needed for constipation.   feeding supplement (ENSURE ENLIVE) Liqd Take 237 mLs by mouth 2 (two) times daily between meals. What changed:    when to take this  additional instructions   Fluticasone-Umeclidin-Vilant 100-62.5-25 MCG/INH Aepb Inhale 1 puff into the lungs daily.   furosemide 40 MG tablet Commonly known as:  LASIX Take 1 tablet (40 mg total) by mouth 2 (two) times daily. What changed:  when to take this   gabapentin 300 MG capsule Commonly known as:  NEURONTIN Take 1 capsule (300 mg total) by mouth 2 (two) times daily.   lisinopril 5 MG tablet Commonly known as:  PRINIVIL,ZESTRIL Take 1 tablet (5 mg total) by mouth daily. What changed:    medication strength  how much to take   LORazepam 1 MG tablet Commonly known as:  ATIVAN Take 1 tablet (1 mg  total) by mouth every 8 (eight) hours.   metolazone 2.5 MG tablet Commonly known as:  ZAROXOLYN Take 1 pill once a week every Wednesday as needed if you have gained more than 2.5 pounds of weight in a week, take an extra pill of 20 mg of potassium that day.   morphine 15 MG 12 hr tablet Commonly known as:  MS CONTIN Take 15 mg by mouth every 8 (eight) hours.   MUCINEX DM 30-600 MG Tb12 Take 1 tablet by mouth every 12 (twelve) hours as needed for cough or congestion.   nicotine 14 mg/24hr patch Commonly known as:  NICODERM CQ - dosed in mg/24 hours Place 1 patch (14 mg total) onto the skin daily.   nystatin cream Commonly known as:  MYCOSTATIN Apply 1 application  topically 2 (two) times daily.   Oxycodone HCl 10 MG Tabs Take 1 tablet (10 mg total) by mouth every 8 (eight) hours as needed. What changed:    how much to take  when to take this  reasons to take this   Oxycodone HCl 10 MG Tabs Take 1-2 tablets (10-20 mg total) by mouth every 4 (four) hours as needed. What changed:  Another medication with the same name was changed. Make sure you understand how and when to take each.   pantoprazole 40 MG tablet Commonly known as:  PROTONIX TAKE 1 TABLET BY MOUTH EVERY DAY   Potassium Chloride ER 20 MEQ Tbcr Take 20 mEq by mouth 2 (two) times daily for 4 days. What changed:  when to take this Notes to patient:  Take for 4 days.   potassium chloride SA 20 MEQ tablet Commonly known as:  K-DUR,KLOR-CON 1 tab po qd EVERY DAY   PROBIOTIC PO Take 1 capsule by mouth daily.   prochlorperazine 10 MG tablet Commonly known as:  COMPAZINE Take 1 tablet (10 mg total) by mouth every 6 (six) hours as needed for nausea or vomiting.   tamsulosin 0.4 MG Caps capsule Commonly known as:  FLOMAX Take 1 capsule (0.4 mg total) by mouth daily after supper. What changed:    when to take this  reasons to take this   traMADol 50 MG tablet Commonly known as:  ULTRAM TAKE 1 TABLET BY MOUTH EVERY 6HRS AS NEEDED What changed:  See the new instructions.   urea 40 % Crea Commonly known as:  CARMOL Apply to right lower leg once daily   zolpidem 10 MG tablet Commonly known as:  AMBIEN Take 1 tablet (10 mg total) by mouth at bedtime as needed for sleep.       Discharge Assessment: Vitals:   05/12/18 0735 05/12/18 0816  BP:  (!) 128/110  Pulse:    Resp:    Temp:    SpO2: 99% 100%   Skin clean, dry and intact without evidence of skin break down, no evidence of skin tears noted. IV catheter discontinued intact. Site without signs and symptoms of complications - no redness or edema noted at insertion site, patient denies c/o pain - only slight  tenderness at site.  Dressing with slight pressure applied.  D/c Instructions-Education: Discharge instructions given to patient/family with verbalized understanding. D/c education completed with patient/family including follow up instructions, medication list, d/c activities limitations if indicated, with other d/c instructions as indicated by MD - patient able to verbalize understanding, all questions fully answered. Patient instructed to return to ED, call 911, or call MD for any changes in condition.  Patient escorted via [place],  and D/C home via private auto.  Cristela Blue, RN 05/12/2018 3:09 PM

## 2018-05-12 NOTE — Discharge Summary (Signed)
Scott Rollins LTR:[id number] DOB: 03/02/1951 DOA: 05/09/2018  PCP: [name], MD  Admit date: 05/09/2018  Discharge date: 05/12/2018  Admitted From: Home   Disposition:  Home   Recommendations for Outpatient Follow-up:   Follow up with PCP in 1-2 weeks  PCP Please obtain BMP/CBC, 2 view CXR in 1week,  (see Discharge instructions)   PCP Please follow up on the following pending results:    Home Health: Home with Hospice   Equipment/Devices: None  Consultations: Cards Discharge Condition: Fair   CODE STATUS: he revokes his DNR and becomes full code in the hospital Diet Recommendation: Heart Healthy strict 1.5 L/day total fluid restriction    Chief Complaint  Patient presents with  . 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                                                                   Hospital Course   1.  Acute on chronic hypoxic respiratory failure requiring BiPAP in the ER.  Due to acute on chronic diastolic CHF recent EF 68%.- he is currently on combination of aspirin, beta-blocker, Lasix, diuresed over 6 L and is much improved and currently no shortness of breath or  rails, uses home oxygen which he will continue, he was seen by cardiology, repeat echo noted with present wall motion abnormalities, he has been transitioned to oral Lasix by cardiology, he is close to his baseline and will be discharged home on his home medications unchanged except I have given him once a week PRN 2.5 mg of Zaroxolyn for more than 2.5 pounds of weight gain in a week.  Written instructions have been provided.  Note patient is hospice at home which she will continue, he revoked his DNR when he is hospitalized.  Goal of care should be gentle medical treatment with comfort being the goal.  2.  Elevated but flat non-ACS pattern troponin.  Due to demand ischemia coming from #1 above, currently on heparin drip and beta-blocker, chest pain-free with EKG being nonacute.  He is currently on Eliquis along with his home dose - ASA and B Blocker.  3.  HX of metastatic prostate cancer.  On home hospice, he is currently revoked hospice for hospitalization.  Long-term goal of care is  medical management and comfort.  No heroics, however he wants full code while he is in the hospital.  Continue home dose Decadron.  4.  Right lower extremity wound.  No signs of surrounding cellulitis, this could be due to edema and venous engorgement.  Wound care, stopped broad-spectrum IV antibiotics and placed on oral doxycycline.  His leukocytosis seems to be chronically elevated for a little while now could be due to malignancy.  Lower extremity venous duplex unremarkable, right lower extremity arterial ultrasound suggestive of 50 to 74% stenosis in the superficial femoral artery.  For now continue aspirin and statin for secondary prevention, poor operative candidate due to metastatic prostate cancer with home hospice status, outpatient vascular surgery follow-up per PCP if he deems it appropriate and needed.  Patient is currently pain-free.  5.  Smoking.  Counseled to quit.  6.  OSA.  BiPAP nightly.  7.   Paroxysmal atrial fibrillation [place] vas 2 score of at least 3.  Resume home dose Eliquis and beta-blocker.  8.  Dyslipidemia.  Resume home dose statin.  9.  History of gout.  Resume home dose allopurinol and colchicine.  10.  Chronic pain.  Home medications resumed.  11.  ARF.    Resolved after his ACE inhibitor was cut down, will cut his home ACE inhibitor dose into half upon discharge PCP to monitor blood pressure and renal function post discharge.   Discharge diagnosis     Principal Problem:   COPD with acute exacerbation ([place]) Active Problems:   Essential hypertension   Coronary artery disease due to lipid rich plaque   Mixed hyperlipidemia   OSA (obstructive sleep apnea)   Atrial fibrillation (HCC)   Prostate cancer metastatic to bone (HCC)   Acute hypercapnic respiratory failure (HCC)   Cellulitis of right lower extremity   ACS (acute coronary syndrome) ([place])   NSTEMI (non-ST elevated myocardial infarction) [facility])    Discharge instructions    Discharge Instructions    Discharge instructions   Complete by:  As directed    Follow with Primary MD [name], MD in 7 days   Get CBC, CMP, 2 view Chest X ray -  checked  by Primary MD or SNF MD in 5-7 days    Activity: As tolerated with Full fall precautions use walker/cane & assistance as needed  Disposition Home with Hospice  Diet: Heart Healthy with 1.5 L/day total fluid restriction  Special Instructions: If you have smoked or chewed Tobacco  in the last 2 yrs please stop smoking, stop any regular Alcohol  and or any Recreational drug use.  On your next visit with your primary care physician please Get Medicines reviewed and adjusted.  Please request your Prim.MD to go over all Hospital Tests and Procedure/Radiological results at the follow up, please get all Hospital records sent to your Prim MD by signing hospital release before you go home.  If you experience worsening of your admission symptoms,  develop shortness of breath, life threatening emergency, suicidal or homicidal thoughts you must seek medical attention immediately by calling 911 or calling your MD immediately  if symptoms less severe.  You Must read complete instructions/literature along with all the possible adverse reactions/side effects for all the Medicines you take and that have been prescribed to you. Take any new Medicines after you have completely understood and accpet all the possible adverse reactions/side effects.   Increase activity slowly   Complete by:  As directed  Discharge Medications   Allergies as of 05/12/2018      Reactions   Amitriptyline Hcl Nausea Only   REACTION: nausea, elevated bp   Ezetimibe Other (See Comments)   REACTION: chest pain, fatigue      Medication List    STOP taking these medications   doxycycline 100 MG tablet Commonly known as:  VIBRA-TABS     TAKE these medications   abiraterone acetate 250 MG tablet Commonly known as:  ZYTIGA Take 4 tablets (1,000 mg total) by mouth daily. Take on an empty stomach 1 hour before or 2 hours after a meal   albuterol 108 (90 Base) MCG/ACT inhaler Commonly known as:  PROVENTIL HFA;VENTOLIN HFA Inhale 2 puffs into the lungs every 6 (six) hours as needed for wheezing or shortness of breath.   albuterol (2.5 MG/3ML) 0.083% nebulizer solution Commonly known as:  PROVENTIL Take 3 mLs (2.5 mg total) by nebulization every 4 (four) hours as needed for wheezing or shortness of breath.   allopurinol 300 MG tablet Commonly known as:  ZYLOPRIM Take 1 tablet (300 mg total) by mouth daily.   apixaban 5 MG Tabs tablet Commonly known as:  ELIQUIS Take 1 tablet (5 mg total) by mouth 2 (two) times daily.   aspirin 81 MG EC tablet Take 1 tablet (81 mg total) by mouth daily.   bisoprolol 10 MG tablet Commonly known as:  ZEBETA Take 1 tablet (10 mg total) by mouth daily.   budesonide-formoterol 160-4.5 MCG/ACT inhaler Commonly known as:   SYMBICORT Inhale 2 puffs into the lungs 2 (two) times daily.   citalopram 40 MG tablet Commonly known as:  CELEXA Take 1 tablet (40 mg total) by mouth at bedtime.   citalopram 40 MG tablet Commonly known as:  CELEXA Take 1 tablet (40 mg total) by mouth daily.   colchicine 0.6 MG tablet Take 1 tablet (0.6 mg total) by mouth 2 (two) times daily as needed (gout flare ups).   dexamethasone 4 MG tablet Commonly known as:  DECADRON Take 12 mg by mouth daily.   diltiazem 60 MG tablet Commonly known as:  CARDIZEM Take 1 tablet (60 mg total) by mouth every 12 (twelve) hours. What changed:    how much to take  when to take this   docusate sodium 100 MG capsule Commonly known as:  COLACE Take 100-200 mg by mouth daily as needed for constipation.   feeding supplement (ENSURE ENLIVE) Liqd Take 237 mLs by mouth 2 (two) times daily between meals. What changed:    when to take this  additional instructions   Fluticasone-Umeclidin-Vilant 100-62.5-25 MCG/INH Aepb Inhale 1 puff into the lungs daily.   furosemide 40 MG tablet Commonly known as:  LASIX Take 1 tablet (40 mg total) by mouth 2 (two) times daily. What changed:  when to take this   gabapentin 300 MG capsule Commonly known as:  NEURONTIN Take 1 capsule (300 mg total) by mouth 2 (two) times daily.   lisinopril 5 MG tablet Commonly known as:  PRINIVIL,ZESTRIL Take 1 tablet (5 mg total) by mouth daily. What changed:    medication strength  how much to take   LORazepam 1 MG tablet Commonly known as:  ATIVAN Take 1 tablet (1 mg total) by mouth every 8 (eight) hours.   metolazone 2.5 MG tablet Commonly known as:  ZAROXOLYN Take 1 pill once a week every Wednesday as needed if you have gained more than 2.5 pounds of weight in a week, take an  extra pill of 20 mg of potassium that day.   morphine 15 MG 12 hr tablet Commonly known as:  MS CONTIN Take 15 mg by mouth every 8 (eight) hours.   MUCINEX DM 30-600 MG  Tb12 Take 1 tablet by mouth every 12 (twelve) hours as needed for cough or congestion.   nicotine 14 mg/24hr patch Commonly known as:  NICODERM CQ - dosed in mg/24 hours Place 1 patch (14 mg total) onto the skin daily.   nystatin cream Commonly known as:  MYCOSTATIN Apply 1 application topically 2 (two) times daily.   Oxycodone HCl 10 MG Tabs Take 1 tablet (10 mg total) by mouth every 8 (eight) hours as needed. What changed:    how much to take  when to take this  reasons to take this   Oxycodone HCl 10 MG Tabs Take 1-2 tablets (10-20 mg total) by mouth every 4 (four) hours as needed. What changed:  Another medication with the same name was changed. Make sure you understand how and when to take each.   pantoprazole 40 MG tablet Commonly known as:  PROTONIX TAKE 1 TABLET BY MOUTH EVERY DAY   Potassium Chloride ER 20 MEQ Tbcr Take 20 mEq by mouth 2 (two) times daily for 4 days. What changed:  when to take this   potassium chloride SA 20 MEQ tablet Commonly known as:  K-DUR,KLOR-CON 1 tab po qd EVERY DAY   PROBIOTIC PO Take 1 capsule by mouth daily.   prochlorperazine 10 MG tablet Commonly known as:  COMPAZINE Take 1 tablet (10 mg total) by mouth every 6 (six) hours as needed for nausea or vomiting.   tamsulosin 0.4 MG Caps capsule Commonly known as:  FLOMAX Take 1 capsule (0.4 mg total) by mouth daily after supper. What changed:    when to take this  reasons to take this   traMADol 50 MG tablet Commonly known as:  ULTRAM TAKE 1 TABLET BY MOUTH EVERY 6HRS AS NEEDED What changed:  See the new instructions.   urea 40 % Crea Commonly known as:  CARMOL Apply to right lower leg once daily   zolpidem 10 MG tablet Commonly known as:  AMBIEN Take 1 tablet (10 mg total) by mouth at bedtime as needed for sleep.       Follow-up Information    [name], [name], MD. Schedule an appointment as soon as possible for a visit in 1 week(s).   Specialty:  Family  Medicine Why:  And your oncologist, cardiologist within a week Contact information: [address] [place] [address] [phone]        Fay Records, MD .   Specialty:  Cardiology Contact information: [address] [place] [address] [phone]           Major procedures and Radiology Reports - PLEASE review detailed and final reports thoroughly  -     TTE  - Left ventricle: Poor acoustic windows limit study, even withDefinity use. Ovreall LVEF is approxiimalely 55% with basal inferior, basal inferolateral hypokinesis. The cavity size wasnormal. Wall thickness was increased in a pattern of mild LVH. Systolic function was normal. The estimated ejection fraction wasin the range of 55% to 60%. - Mitral valve: Calcified annulus.  [place] Leg - Right: There is no evidence of deep vein thrombosis in the lower extremity. No cystic structure found in the popliteal fossa. Left: There is no evidence of deep vein thrombosis in the  lower extremity. No cystic structure found in the popliteal fossa.  [place] Leg - Arterial -  Right: 50-74% stenosis noted in the superficial femoral artery.    Ct Head Wo Contrast  Result Date: 05/09/2018 CLINICAL DATA:  Altered mental status. EXAM: CT HEAD WITHOUT CONTRAST TECHNIQUE: Contiguous axial images were obtained from the base of the skull through the vertex without intravenous contrast. COMPARISON:  Report 09/24/2002 FINDINGS: Brain: Age related involutional changes of the brain with moderate appearing small vessel ischemic disease of periventricular white matter. No large vascular territory infarct, hemorrhage, midline shift or edema. No intra-axial mass nor extra-axial fluid. Midline fourth ventricle and basal cisterns. Brainstem cell Byrum are nonacute. Vascular: No hyperdense vessel sign. Skull: No acute skull fracture or suspicious osseous lesions. Sinuses/Orbits: Nonacute Other: None IMPRESSION: 1. Moderate  small vessel ischemic disease of periventricular white matter, likely chronic given previous description in 2004. 2. No acute intracranial abnormality. Electronically Signed   By: Ashley Royalty M.D.   On: 05/09/2018 14:06   Dg Chest Port 1 View  Result Date: 05/09/2018 CLINICAL DATA:  67 year old male with shortness of breath. EXAM: PORTABLE CHEST 1 VIEW COMPARISON:  Chest radiograph dated 03/03/2018 FINDINGS: Bibasilar interstitial coarsening/scarring similar to prior radiograph. No focal consolidation, pleural effusion, or pneumothorax. Stable cardiomegaly. No acute osseous pathology. IMPRESSION: No focal consolidation.  No significant interval change. Electronically Signed   By: Anner Crete M.D.   On: 05/09/2018 05:11   Dg Ankle Right Port  Result Date: 05/09/2018 CLINICAL DATA:  Right lower extremity swelling/redness EXAM: PORTABLE RIGHT ANKLE - 2 VIEW COMPARISON:  None. FINDINGS: No fracture or dislocation is seen. The ankle mortise is intact. Soft tissue ulceration/wound along the lateral aspect of the distal fibular shaft. No underlying cortical irregularity. IMPRESSION: And soft tissues recent/wound along the lateral aspect of the distal fibular shaft. No findings to suggest osteomyelitis.  No acute osseous abnormality. Electronically Signed   By: Julian Hy M.D.   On: 05/09/2018 10:36   Dg Foot 2 Views Right  Result Date: 05/09/2018 CLINICAL DATA:  Right lower extremity redness/swelling EXAM: RIGHT FOOT - 2 VIEW COMPARISON:  None. FINDINGS: No fracture or dislocation is seen. The joint spaces are preserved. Mild dorsal soft tissue swelling overlying the midfoot. Tiny plantar calcaneal enthesophyte. IMPRESSION: Mild dorsal soft tissue swelling overlying the midfoot. No acute osseus abnormality is seen. Electronically Signed   By: Julian Hy M.D.   On: 05/09/2018 10:36   Vas [place] Lower Extremity Arterial Duplex  Result Date: 05/10/2018 LOWER EXTREMITY ARTERIAL DUPLEX STUDY  Indications: Ulceration.  Current ABI: unable to obtain ABI due to pain tolerance and location of wounds Performing Technologist: Abram Sander  Examination Guidelines: A complete evaluation includes B-mode imaging, spectral Doppler, color Doppler, and power Doppler as needed of all accessible portions of each vessel. Bilateral testing is considered an integral part of a complete examination. Limited examinations for reoccurring indications may be performed as noted.  Right Duplex Findings: +-----------+--------+-----+---------------+---------+--------+            PSV cm/sRatioStenosis       Waveform Comments +-----------+--------+-----+---------------+---------+--------+ CFA Prox   76                          triphasic         +-----------+--------+-----+---------------+---------+--------+ DFA        75  triphasic         +-----------+--------+-----+---------------+---------+--------+ SFA Prox   111                         triphasic         +-----------+--------+-----+---------------+---------+--------+ SFA Mid    268          50-74% stenosistriphasic         +-----------+--------+-----+---------------+---------+--------+ POP Prox   90                          triphasic         +-----------+--------+-----+---------------+---------+--------+ ATA Distal 101                         triphasic         +-----------+--------+-----+---------------+---------+--------+ PTA Distal 78                          triphasic         +-----------+--------+-----+---------------+---------+--------+ PERO Distal56                          triphasic         +-----------+--------+-----+---------------+---------+--------+  Summary: Right: 50-74% stenosis noted in the superficial femoral artery.  See table(s) above for measurements and observations. Electronically signed by Deitra Mayo MD on 05/10/2018 at 5:27:11 PM.    Final    Vas [place] Lower  Extremity Venous (dvt)  Result Date: 05/10/2018  Lower Venous Study Indications: Edema.  Performing Technologist: Abram Sander  Examination Guidelines: A complete evaluation includes B-mode imaging, spectral Doppler, color Doppler, and power Doppler as needed of all accessible portions of each vessel. Bilateral testing is considered an integral part of a complete examination. Limited examinations for reoccurring indications may be performed as noted.  Right Venous Findings: +---------+---------------+---------+-----------+----------+--------------+          CompressibilityPhasicitySpontaneityPropertiesSummary        +---------+---------------+---------+-----------+----------+--------------+ CFV      Full           Yes      Yes                                 +---------+---------------+---------+-----------+----------+--------------+ SFJ      Full                                                        +---------+---------------+---------+-----------+----------+--------------+ FV Prox  Full                                                        +---------+---------------+---------+-----------+----------+--------------+ FV Mid   Full                                                        +---------+---------------+---------+-----------+----------+--------------+ FV DistalFull                                                        +---------+---------------+---------+-----------+----------+--------------+  PFV      Full                                                        +---------+---------------+---------+-----------+----------+--------------+ POP      Full           Yes      Yes                                 +---------+---------------+---------+-----------+----------+--------------+ PTV      Full                                                        +---------+---------------+---------+-----------+----------+--------------+ PERO                                                   not visualized +---------+---------------+---------+-----------+----------+--------------+  Left Venous Findings: +---------+---------------+---------+-----------+----------+--------------+          CompressibilityPhasicitySpontaneityPropertiesSummary        +---------+---------------+---------+-----------+----------+--------------+ CFV      Full           Yes      Yes                                 +---------+---------------+---------+-----------+----------+--------------+ SFJ      Full                                                        +---------+---------------+---------+-----------+----------+--------------+ FV Prox  Full                                                        +---------+---------------+---------+-----------+----------+--------------+ FV Mid   Full                                                        +---------+---------------+---------+-----------+----------+--------------+ FV DistalFull                                                        +---------+---------------+---------+-----------+----------+--------------+ PFV      Full                                                        +---------+---------------+---------+-----------+----------+--------------+  POP      Full           Yes      Yes                                 +---------+---------------+---------+-----------+----------+--------------+ PTV      Full                                                        +---------+---------------+---------+-----------+----------+--------------+ PERO                                                  not visualized +---------+---------------+---------+-----------+----------+--------------+    Summary: Right: There is no evidence of deep vein thrombosis in the lower extremity. No cystic structure found in the popliteal fossa. Left: There is no evidence of deep vein thrombosis in the lower  extremity. No cystic structure found in the popliteal fossa.  *See table(s) above for measurements and observations. Electronically signed by Deitra Mayo MD on 05/10/2018 at 5:26:24 PM.    Final     Micro Results     Recent Results (from the past 240 hour(s))  MRSA PCR Screening     Status: None   Collection Time: 05/09/18  8:15 PM  Result Value Ref Range Status   MRSA by PCR NEGATIVE NEGATIVE Final    Comment:        The GeneXpert MRSA Assay (FDA approved for NASAL specimens only), is one component of a comprehensive MRSA colonization surveillance program. It is not intended to diagnose MRSA infection nor to guide or monitor treatment for MRSA infections. Performed at [facility], [place] [address] [place], [place] [address]     Today   Subjective    Jb [place] today has no headache,no chest abdominal pain,no new weakness tingling or numbness, feels much better wants to go home today.     Objective   Blood pressure 125/100, pulse 95, temperature 97.7 F (36.5 C), resp. rate 18, weight 105.3 kg, SpO2 100 %.   Intake/Output Summary (Last 24 hours) at 05/12/2018 0858 Last data filed at 05/11/2018 1711 Gross per 24 hour  Intake 220 ml  Output 600 ml  Net -380 ml    Exam   Awake Alert, Oriented X 3, No new F.N deficits, Normal affect [place].AT,PERRAL Supple Neck,No JVD, No cervical lymphadenopathy appriciated.  Symmetrical Chest wall movement, Good air movement bilaterally, CTAB RRR,No Gallops, Rubs or new Murmurs, No Parasternal Heave +ve B.Sounds, Abd Soft, No tenderness, No organomegaly appriciated, No rebound - guarding or rigidity. No Cyanosis, +ve 1-2+ leg  edema, right lower extremity wound on the lateral aspect and a bandage, no surrounding cellulitis    Data Review   CBC w Diff:  Lab Results  Component Value Date   WBC 13.4 (H) 05/12/2018   HGB 14.8 05/12/2018   HGB 13.7 03/11/2018   HGB 15.1 06/26/2017   HCT 49.7 05/12/2018    HCT 46.2 06/26/2017   PLT 192 05/12/2018   PLT 453 (H) 03/11/2018   PLT 275 06/26/2017   LYMPHOPCT 7 05/10/2018   LYMPHOPCT 15.8 06/26/2017  BANDSPCT SPECIMEN CLOTTED 11/16/2017   MONOPCT 5 05/10/2018   MONOPCT 4.3 06/26/2017   EOSPCT 0 05/10/2018   EOSPCT 1.9 06/26/2017   BASOPCT 0 05/10/2018   BASOPCT 0.4 06/26/2017    CMP:  Lab Results  Component Value Date   NA 142 05/12/2018   NA 144 01/08/2018   NA 141 06/26/2017   K 4.7 05/12/2018   K 4.3 06/26/2017   CL 90 (L) 05/12/2018   CO2 44 (H) 05/12/2018   CO2 30 (H) 06/26/2017   BUN 60 (H) 05/12/2018   BUN 11 01/08/2018   BUN 18.0 06/26/2017   CREATININE 1.13 05/12/2018   CREATININE 0.96 03/11/2018   CREATININE 1.0 06/26/2017   GLU 75 01/08/2018   PROT 5.3 (L) 05/10/2018   PROT 7.0 06/26/2017   ALBUMIN 2.7 (L) 05/10/2018   ALBUMIN 3.6 06/26/2017   BILITOT 0.7 05/10/2018   BILITOT 0.4 03/11/2018   BILITOT 0.47 06/26/2017   ALKPHOS 48 05/10/2018   ALKPHOS 103 06/26/2017   AST 30 05/10/2018   AST 14 (L) 03/11/2018   AST 20 06/26/2017   ALT 33 05/10/2018   ALT 12 03/11/2018   ALT 21 06/26/2017  .   Total Time in preparing paper work, data evaluation and todays exam - 8 minutes  Lala Lund M.D on 05/12/2018 at 8:58 AM  [facility]   Office  [phone]

## 2018-05-12 NOTE — Progress Notes (Signed)
Physical Therapy Treatment Patient Details Name: Scott Rollins MRN: [id number] DOB: [birth year] Today's Date: 05/12/2018    History of Present Illness Pt is a 67 y.o. 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     PT Comments    Improved over last treatment with ability to more easily focus on task, but still very deconditioned.   Follow Up Recommendations  Home health PT;Other (comment)     Equipment Recommendations  3in1 (PT)    Recommendations for Other Services       Precautions / Restrictions Precautions Precautions: Fall    Mobility  Bed Mobility                  Transfers Overall transfer level: Needs assistance Equipment used: Rolling walker (2 wheeled) Transfers: Sit to/from Stand Sit to Stand: Mod assist;+2 safety/equipment         General transfer comment: cues for using armrests, assist to come forward.  Ambulation/Gait Ambulation/Gait assistance: Mod assist;+2 safety/equipment Gait Distance (Feet): 5 Feet(x2 to/from BSC and back to recliner.) Assistive device: Rolling walker (2 wheeled) Gait Pattern/deviations: Step-through pattern   Gait velocity interpretation: <1.31 ft/sec, indicative of household ambulator General Gait Details: unsteady gait, but pt more anxious about ambulating further than he should be.  Pt moved haltingly, but no signs of buckling or overt problems with balance.   Stairs             Wheelchair Mobility    Modified Rankin (Stroke Patients  Only)       Balance Overall balance assessment: Needs assistance   Sitting balance-Leahy Scale: Fair       Standing balance-Leahy Scale: Poor Standing balance comment: reliant on external assist or RW                            Cognition Arousal/Alertness: Awake/alert;Lethargic Behavior During Therapy: Flat affect Overall Cognitive Status: No family/caregiver present to determine baseline cognitive functioning                                 General Comments: Improved over last session      Exercises      General Comments        Pertinent Vitals/Pain Pain Assessment: Faces Faces Pain Scale: Hurts a little bit Pain Location: RLE with certain movements Pain Descriptors / Indicators: Grimacing;Guarding Pain Intervention(s): Limited activity within patient's tolerance    Home Living                      Prior Function            PT Goals (current goals can now be found in the care plan section) Acute Rehab PT Goals Patient Stated Goal: none stated PT Goal Formulation: With patient Time For Goal Achievement: 05/25/18 Potential to Achieve Goals: Good Progress towards PT goals: Progressing toward goals    Frequency    Min 3X/week      PT Plan Discharge plan needs to be updated    Co-evaluation  AM-PAC PT "6 Clicks" Daily Activity  Outcome Measure  Difficulty turning over in bed (including adjusting bedclothes, sheets and blankets)?: Unable Difficulty moving from lying on back to sitting on the side of the bed? : Unable Difficulty sitting down on and standing up from a chair with arms (e.g., wheelchair, bedside commode, etc,.)?: Unable Help needed moving to and from a bed to chair (including a wheelchair)?: A Little Help needed walking in hospital room?: A Lot Help needed climbing 3-5 steps with a railing? : A Lot 6 Click Score: 10    End of Session   Activity Tolerance: Patient tolerated  treatment well Patient left: in chair;with call bell/phone within reach;with chair alarm set Nurse Communication: Mobility status PT Visit Diagnosis: Unsteadiness on feet (R26.81);Muscle weakness (generalized) (M62.81);Difficulty in walking, not elsewhere classified (R26.2);Other symptoms and signs involving the nervous system (R29.898)     Time: 3437-3578 PT Time Calculation (min) (ACUTE ONLY): 17 min  Charges:  $Therapeutic Activity: 8-22 mins                     05/12/2018  Donnella Sham, PT Acute Rehabilitation Services [phone]  (pager) [phone]  (office)   Tessie Fass Tarry Blayney 05/12/2018, 2:34 PM

## 2018-05-12 NOTE — Care Management Note (Signed)
Case Management Note  Patient Details  Name: Scott Rollins MRN: [id number] Date of Birth: 09/06/1950  Subjective/Objective:   CHF                 Action/Plan: Spoke to pt and states he needs a 3n1 bedside commode. Contacted [facility] for DME and HHPT visit. States they can arrange 1 or 2 safety visits. Pt states he will have family provide transportation. Has other DME at home.   Expected Discharge Date:  05/12/18               Expected Discharge Plan:  Home w Hospice Care  In-House Referral:  NA  Discharge planning Services  CM Consult  Post Acute Care Choice:  Hospice, Resumption of Svcs/PTA Provider Choice offered to:  Patient  DME Arranged:  N/A DME Agency:  NA  HH Arranged:  RN [place] Agency:  [facility]  Status of Service:  Completed, signed off  If discussed at [employer], dates discussed:    Additional Comments:  Erenest Rasher, RN 05/12/2018, 10:42 AM

## 2018-05-12 NOTE — Progress Notes (Signed)
Progress Note  Patient Name: Scott Rollins Date of Encounter: 05/12/2018  Primary Cardiologist: Dorris Carnes, MD   Subjective   Feeling tired.  He did not sleep well and is anxious to go home.  He denies any palpitations.  Breathing is stable.  Inpatient Medications    Scheduled Meds: . allopurinol  300 mg Oral Daily  . apixaban  5 mg Oral BID  . aspirin EC  81 mg Oral Daily  . bisoprolol  5 mg Oral Daily  . budesonide (PULMICORT) nebulizer solution  0.5 mg Nebulization BID  . citalopram  40 mg Oral Daily  . dexamethasone  12 mg Oral Daily  . diltiazem  30 mg Oral Once  . diltiazem  60 mg Oral Q12H  . docusate sodium  100-200 mg Oral BID  . doxycycline  100 mg Oral Q12H  . feeding supplement (ENSURE ENLIVE)  237 mL Oral BID BM  . furosemide  40 mg Oral BID  . gabapentin  300 mg Oral BID  . morphine  15 mg Oral Q8H  . nitroGLYCERIN  0.5 inch Topical Q6H  . pantoprazole  40 mg Oral Daily   Continuous Infusions:  PRN Meds: acetaminophen **OR** [DISCONTINUED] acetaminophen, albuterol, colchicine, LORazepam, [DISCONTINUED] ondansetron **OR** ondansetron (ZOFRAN) IV, oxyCODONE, tamsulosin   Vital Signs    Vitals:   05/11/18 2225 05/12/18 0611 05/12/18 0735 05/12/18 0816  BP: 128/67 97/66  (!) 128/110  Pulse: 63 (!) 105    Resp: 16 18    Temp: 98 F (36.7 C) 97.7 F (36.5 C)    TempSrc:      SpO2: 97% 99% 99% 100%  Weight:        Intake/Output Summary (Last 24 hours) at 05/12/2018 1020 Last data filed at 05/11/2018 1711 Gross per 24 hour  Intake 220 ml  Output 600 ml  Net -380 ml   Filed Weights   05/09/18 0800 05/10/18 0838 05/11/18 0533  Weight: 104 kg 106.7 kg 105.3 kg    Telemetry    Atrial fibrillation.  Converted from sinus rhythm to A. fib overnight.  PVCs.  Rate mostly in the 90s.  Occasionally up to 140.- Personally Reviewed  ECG    Sinus rhythm.  Rate 76 bpm.  Early R wave progression.  - Personally Reviewed  Physical Exam   VS:  BP  (!) 128/110 (BP Location: Right Arm)   Pulse (!) 105   Temp 97.7 F (36.5 C)   Resp 18   Wt 105.3 kg   SpO2 100%   BMI 32.84 kg/m  , BMI Body mass index is 32.84 kg/m. GENERAL: Chronically ill-appearing HEENT: Pupils equal round and reactive, fundi not visualized, oral mucosa unremarkable NECK:  No jugular venous distention, waveform within normal limits, carotid upstroke brisk and symmetric, no bruits LUNGS:  Clear to auscultation bilaterally HEART:  Irregularly irregular.  PMI not displaced or sustained,S1 and S2 within normal limits, no S3, no S4, no clicks, no rubs, no murmurs ABD:  Flat, positive bowel sounds normal in frequency in pitch, no bruits, no rebound, no guarding, no midline pulsatile mass, no hepatomegaly, no splenomegaly EXT:  2 plus pulses throughout, no edema, no cyanosis no clubbing SKIN:  No rashes no nodules NEURO:  Cranial nerves II through XII grossly intact, motor grossly intact throughout [facility]:  Cognitively intact, oriented to person place and time    Labs    Chemistry Recent Labs  Lab 05/10/18 0536 05/11/18 0544 05/12/18 0331  NA 139 141  142  K 4.1 4.2 4.7  CL 95* 91* 90*  CO2 33* 41* 44*  GLUCOSE 125* 123* 85  BUN 55* 61* 60*  CREATININE 1.23 1.31* 1.13  CALCIUM 8.8* 8.9 9.3  PROT 5.3*  --   --   ALBUMIN 2.7*  --   --   AST 30  --   --   ALT 33  --   --   ALKPHOS 48  --   --   BILITOT 0.7  --   --   GFRNONAA 59* 54* >60  GFRAA >60 >60 >60  ANIONGAP 11 9 8      Hematology Recent Labs  Lab 05/10/18 0536 05/11/18 0544 05/12/18 0331  WBC 15.1* 12.4* 13.4*  RBC 5.06 4.96 5.26  HGB 14.4 14.2 14.8  HCT 45.6 45.7 49.7  MCV 90.1 92.1 94.5  MCH 28.5 28.6 28.1  MCHC 31.6 31.1 29.8*  RDW 15.4 15.1 15.0  PLT 185 168 192    Cardiac Enzymes Recent Labs  Lab 05/09/18 1136 05/09/18 2132 05/10/18 0536  TROPONINI 0.93* 1.06* 1.28*    Recent Labs  Lab 05/09/18 0514  TROPIPOC 0.86*     BNP Recent Labs  Lab 05/09/18 0440  BNP  704.7*     DDimer No results for input(s): DDIMER in the last 168 hours.   Radiology    Vas [place] Lower Extremity Arterial Duplex  Result Date: 05/10/2018 LOWER EXTREMITY ARTERIAL DUPLEX STUDY Indications: Ulceration.  Current ABI: unable to obtain ABI due to pain tolerance and location of wounds Performing Technologist: Abram Sander  Examination Guidelines: A complete evaluation includes B-mode imaging, spectral Doppler, color Doppler, and power Doppler as needed of all accessible portions of each vessel. Bilateral testing is considered an integral part of a complete examination. Limited examinations for reoccurring indications may be performed as noted.  Right Duplex Findings: +-----------+--------+-----+---------------+---------+--------+            PSV cm/sRatioStenosis       Waveform Comments +-----------+--------+-----+---------------+---------+--------+ CFA Prox   76                          triphasic         +-----------+--------+-----+---------------+---------+--------+ DFA        75                          triphasic         +-----------+--------+-----+---------------+---------+--------+ SFA Prox   111                         triphasic         +-----------+--------+-----+---------------+---------+--------+ SFA Mid    268          50-74% stenosistriphasic         +-----------+--------+-----+---------------+---------+--------+ POP Prox   90                          triphasic         +-----------+--------+-----+---------------+---------+--------+ ATA Distal 101                         triphasic         +-----------+--------+-----+---------------+---------+--------+ PTA Distal 78                          triphasic         +-----------+--------+-----+---------------+---------+--------+  PERO Distal56                          triphasic         +-----------+--------+-----+---------------+---------+--------+  Summary: Right: 50-74% stenosis noted  in the superficial femoral artery.  See table(s) above for measurements and observations. Electronically signed by Deitra Mayo MD on 05/10/2018 at 5:27:11 PM.    Final    Vas [place] Lower Extremity Venous (dvt)  Result Date: 05/10/2018  Lower Venous Study Indications: Edema.  Performing Technologist: Abram Sander  Examination Guidelines: A complete evaluation includes B-mode imaging, spectral Doppler, color Doppler, and power Doppler as needed of all accessible portions of each vessel. Bilateral testing is considered an integral part of a complete examination. Limited examinations for reoccurring indications may be performed as noted.  Right Venous Findings: +---------+---------------+---------+-----------+----------+--------------+          CompressibilityPhasicitySpontaneityPropertiesSummary        +---------+---------------+---------+-----------+----------+--------------+ CFV      Full           Yes      Yes                                 +---------+---------------+---------+-----------+----------+--------------+ SFJ      Full                                                        +---------+---------------+---------+-----------+----------+--------------+ FV Prox  Full                                                        +---------+---------------+---------+-----------+----------+--------------+ FV Mid   Full                                                        +---------+---------------+---------+-----------+----------+--------------+ FV DistalFull                                                        +---------+---------------+---------+-----------+----------+--------------+ PFV      Full                                                        +---------+---------------+---------+-----------+----------+--------------+ POP      Full           Yes      Yes                                  +---------+---------------+---------+-----------+----------+--------------+ PTV      Full                                                        +---------+---------------+---------+-----------+----------+--------------+  PERO                                                  not visualized +---------+---------------+---------+-----------+----------+--------------+  Left Venous Findings: +---------+---------------+---------+-----------+----------+--------------+          CompressibilityPhasicitySpontaneityPropertiesSummary        +---------+---------------+---------+-----------+----------+--------------+ CFV      Full           Yes      Yes                                 +---------+---------------+---------+-----------+----------+--------------+ SFJ      Full                                                        +---------+---------------+---------+-----------+----------+--------------+ FV Prox  Full                                                        +---------+---------------+---------+-----------+----------+--------------+ FV Mid   Full                                                        +---------+---------------+---------+-----------+----------+--------------+ FV DistalFull                                                        +---------+---------------+---------+-----------+----------+--------------+ PFV      Full                                                        +---------+---------------+---------+-----------+----------+--------------+ POP      Full           Yes      Yes                                 +---------+---------------+---------+-----------+----------+--------------+ PTV      Full                                                        +---------+---------------+---------+-----------+----------+--------------+ PERO  not visualized  +---------+---------------+---------+-----------+----------+--------------+    Summary: Right: There is no evidence of deep vein thrombosis in the lower extremity. No cystic structure found in the popliteal fossa. Left: There is no evidence of deep vein thrombosis in the lower extremity. No cystic structure found in the popliteal fossa.  *See table(s) above for measurements and observations. Electronically signed by Deitra Mayo MD on 05/10/2018 at 5:26:24 PM.    Final     Cardiac Studies   Echo 05/10/18: Study Conclusions  - Left ventricle: Poor acoustic windows limit study, even with   Definity use. Ovreall LVEF is approxiimalely 55% with basal   inferior, basal inferolateral hypokinesis. The cavity size was   normal. Wall thickness was increased in a pattern of mild LVH.   Systolic function was normal. The estimated ejection fraction was   in the range of 55% to 60%. - Mitral valve: Calcified annulus.  LE Dopplers 05/10/18: Negative for DVT.   Patient Profile     66 y.o. male with metastatic prostate cancer, hypertension, CAD status post prior PCI, paroxysmal atrial fibrillation, multifocal atrial tachycardia, COPD, admitted with hypercarbic respiratory failure and cellulitis.  He also had acute on chronic diastolic heart failure.  Assessment & Plan    # Acute on chronic diastolic heart failure:  # Hypertension: BNP was elevated to 704.  He is net -5.6L.  Lasix was held yesterday due to worsening renal function.  His creatinine has improved to baseline today.  Would resume home oral Lasix.  Continue bisoprolol.  His home lisinopril was held due to acute renal failure and hypotension.  Would consider resuming this as an outpatient.  Echo showed that his systolic function was preserved but he had some hypokinesis of the basal inferior and inferolateral myocardium.  Given that he is on hospice and not having any chest pain there is no plan for an invasive ischemic evaluation at this  time.    # Elevated troponin: # CAD s/p PCI: Troponin elevated to 1.28.  Mr. Lyssy reports that he has not had any issues with chest pain prior to this admission.  It is likely that the chest discomfort he was experiencing was related to demand ischemia and volume overload and the pain seems to have resolved.  He does not want any aggressive interventions given that he is on hospice.  Continue bisoprolol.  If he has recurrent chest pain we can add long acting nitrates.  He was on heparin and his home Eliquis was held for 48 hours.  # Paroxysmal atrial fibrillation:  Mr. underwent went back into atrial fibrillation this morning.  His rates are intermittently poorly controlled.  Agree with increasing diltiazem and continue bisoprolol.  Continue Eliquis.        For questions or updates, please contact [facility] Please consult [email or web address] for contact info under     [place] will sign off.   Medication Recommendations: None Other recommendations (labs, testing, etc):  Check BMP in 1 week Follow up as an outpatient: We will arrange outpatient follow-up with Dr. Harrington Challenger     Signed, Skeet Latch, MD  05/12/2018, 10:20 AM

## 2018-05-13 DIAGNOSIS — Z191 Hormone sensitive malignancy status: Secondary | ICD-10-CM | POA: Diagnosis not present

## 2018-05-13 DIAGNOSIS — C61 Malignant neoplasm of prostate: Secondary | ICD-10-CM | POA: Diagnosis not present

## 2018-05-13 DIAGNOSIS — R590 Localized enlarged lymph nodes: Secondary | ICD-10-CM | POA: Diagnosis not present

## 2018-05-13 DIAGNOSIS — C7951 Secondary malignant neoplasm of bone: Secondary | ICD-10-CM | POA: Diagnosis not present

## 2018-05-13 DIAGNOSIS — I4891 Unspecified atrial fibrillation: Secondary | ICD-10-CM | POA: Diagnosis not present

## 2018-05-13 DIAGNOSIS — I110 Hypertensive heart disease with heart failure: Secondary | ICD-10-CM | POA: Diagnosis not present

## 2018-05-14 ENCOUNTER — Telehealth: Payer: Self-pay

## 2018-05-14 DIAGNOSIS — R590 Localized enlarged lymph nodes: Secondary | ICD-10-CM | POA: Diagnosis not present

## 2018-05-14 DIAGNOSIS — J449 Chronic obstructive pulmonary disease, unspecified: Secondary | ICD-10-CM | POA: Diagnosis not present

## 2018-05-14 DIAGNOSIS — C7951 Secondary malignant neoplasm of bone: Secondary | ICD-10-CM | POA: Diagnosis not present

## 2018-05-14 DIAGNOSIS — G4733 Obstructive sleep apnea (adult) (pediatric): Secondary | ICD-10-CM | POA: Diagnosis not present

## 2018-05-14 DIAGNOSIS — I2510 Atherosclerotic heart disease of native coronary artery without angina pectoris: Secondary | ICD-10-CM | POA: Diagnosis not present

## 2018-05-14 DIAGNOSIS — Z87891 Personal history of nicotine dependence: Secondary | ICD-10-CM | POA: Diagnosis not present

## 2018-05-14 DIAGNOSIS — C61 Malignant neoplasm of prostate: Secondary | ICD-10-CM | POA: Diagnosis not present

## 2018-05-14 DIAGNOSIS — I4891 Unspecified atrial fibrillation: Secondary | ICD-10-CM | POA: Diagnosis not present

## 2018-05-14 DIAGNOSIS — Z191 Hormone sensitive malignancy status: Secondary | ICD-10-CM | POA: Diagnosis not present

## 2018-05-14 DIAGNOSIS — I110 Hypertensive heart disease with heart failure: Secondary | ICD-10-CM | POA: Diagnosis not present

## 2018-05-14 DIAGNOSIS — I509 Heart failure, unspecified: Secondary | ICD-10-CM | POA: Diagnosis not present

## 2018-05-14 NOTE — Telephone Encounter (Signed)
Noted: nurse phone contact with patient for TCM. Signed:  Crissie Sickles, MD           05/14/2018

## 2018-05-14 NOTE — Telephone Encounter (Signed)
Spoke with patients daughter, Scott Rollins. Patient and spouse not available at time of call.  Advised Scott Rollins to be added to patients DPR at next office visit.   Transition Care Management Follow-up Telephone Call  Admit date: 05/09/2018   Discharge date: 05/12/2018 CC: Respiratory Distress   How have you been since you were released from the hospital? "His breathing is much better"   Do you understand why you were in the hospital? yes   Do you understand the discharge instructions? yes   Where were you discharged to? Home. Lives with wife. Daughter assist with care. [facility] also involved with care, nurse weekly.    Items Reviewed:  Medications reviewed: no. Advised to bring medications to appt  Allergies reviewed: yes  Dietary changes reviewed: yes  Referrals reviewed: yes   Functional Questionnaire:   Activities of Daily Living (ADLs):   He states they are independent in the following: None States they require assistance with the following: ambulation, bathing and hygiene, feeding, continence, grooming, toileting and dressing   Any transportation issues/concerns?: no   Any patient concerns? no   Confirmed importance and date/time of follow-up visits scheduled yes  Wife to call back to schedule hospital f/u.   Confirmed with patient if condition begins to worsen call PCP or go to the ER.  Patient was given the office number and encouraged to call back with question or concerns.  : yes

## 2018-05-18 DIAGNOSIS — C7951 Secondary malignant neoplasm of bone: Secondary | ICD-10-CM | POA: Diagnosis not present

## 2018-05-18 DIAGNOSIS — I110 Hypertensive heart disease with heart failure: Secondary | ICD-10-CM | POA: Diagnosis not present

## 2018-05-18 DIAGNOSIS — C61 Malignant neoplasm of prostate: Secondary | ICD-10-CM | POA: Diagnosis not present

## 2018-05-18 DIAGNOSIS — I4891 Unspecified atrial fibrillation: Secondary | ICD-10-CM | POA: Diagnosis not present

## 2018-05-18 DIAGNOSIS — Z191 Hormone sensitive malignancy status: Secondary | ICD-10-CM | POA: Diagnosis not present

## 2018-05-18 DIAGNOSIS — R590 Localized enlarged lymph nodes: Secondary | ICD-10-CM | POA: Diagnosis not present

## 2018-05-19 DIAGNOSIS — C7951 Secondary malignant neoplasm of bone: Secondary | ICD-10-CM | POA: Diagnosis not present

## 2018-05-19 DIAGNOSIS — I110 Hypertensive heart disease with heart failure: Secondary | ICD-10-CM | POA: Diagnosis not present

## 2018-05-19 DIAGNOSIS — I4891 Unspecified atrial fibrillation: Secondary | ICD-10-CM | POA: Diagnosis not present

## 2018-05-19 DIAGNOSIS — C61 Malignant neoplasm of prostate: Secondary | ICD-10-CM | POA: Diagnosis not present

## 2018-05-19 DIAGNOSIS — Z191 Hormone sensitive malignancy status: Secondary | ICD-10-CM | POA: Diagnosis not present

## 2018-05-19 DIAGNOSIS — R590 Localized enlarged lymph nodes: Secondary | ICD-10-CM | POA: Diagnosis not present

## 2018-05-20 DIAGNOSIS — Z191 Hormone sensitive malignancy status: Secondary | ICD-10-CM | POA: Diagnosis not present

## 2018-05-20 DIAGNOSIS — I4891 Unspecified atrial fibrillation: Secondary | ICD-10-CM | POA: Diagnosis not present

## 2018-05-20 DIAGNOSIS — R590 Localized enlarged lymph nodes: Secondary | ICD-10-CM | POA: Diagnosis not present

## 2018-05-20 DIAGNOSIS — I110 Hypertensive heart disease with heart failure: Secondary | ICD-10-CM | POA: Diagnosis not present

## 2018-05-20 DIAGNOSIS — C7951 Secondary malignant neoplasm of bone: Secondary | ICD-10-CM | POA: Diagnosis not present

## 2018-05-20 DIAGNOSIS — C61 Malignant neoplasm of prostate: Secondary | ICD-10-CM | POA: Diagnosis not present

## 2018-05-21 DIAGNOSIS — R590 Localized enlarged lymph nodes: Secondary | ICD-10-CM | POA: Diagnosis not present

## 2018-05-21 DIAGNOSIS — I110 Hypertensive heart disease with heart failure: Secondary | ICD-10-CM | POA: Diagnosis not present

## 2018-05-21 DIAGNOSIS — Z191 Hormone sensitive malignancy status: Secondary | ICD-10-CM | POA: Diagnosis not present

## 2018-05-21 DIAGNOSIS — C61 Malignant neoplasm of prostate: Secondary | ICD-10-CM | POA: Diagnosis not present

## 2018-05-21 DIAGNOSIS — C7951 Secondary malignant neoplasm of bone: Secondary | ICD-10-CM | POA: Diagnosis not present

## 2018-05-21 DIAGNOSIS — I4891 Unspecified atrial fibrillation: Secondary | ICD-10-CM | POA: Diagnosis not present

## 2018-05-22 DIAGNOSIS — I4891 Unspecified atrial fibrillation: Secondary | ICD-10-CM | POA: Diagnosis not present

## 2018-05-22 DIAGNOSIS — R590 Localized enlarged lymph nodes: Secondary | ICD-10-CM | POA: Diagnosis not present

## 2018-05-22 DIAGNOSIS — Z191 Hormone sensitive malignancy status: Secondary | ICD-10-CM | POA: Diagnosis not present

## 2018-05-22 DIAGNOSIS — C61 Malignant neoplasm of prostate: Secondary | ICD-10-CM | POA: Diagnosis not present

## 2018-05-22 DIAGNOSIS — C7951 Secondary malignant neoplasm of bone: Secondary | ICD-10-CM | POA: Diagnosis not present

## 2018-05-22 DIAGNOSIS — I110 Hypertensive heart disease with heart failure: Secondary | ICD-10-CM | POA: Diagnosis not present

## 2018-05-24 NOTE — Progress Notes (Deleted)
Cardiology Office Note    Date:  05/24/2018   ID:  Scott Rollins, DOB [birth year], MRN [id number]  PCP:  [name], MD  Cardiologist:  Dr. Harrington Challenger  Chief Complaint: Hospital follow up   History of Present Illness:   Scott Rollins is a 67 y.o. male with metastatic prostate cancer, hypertension, CAD status post prior PCI, paroxysmal atrial fibrillation, multifocal atrial tachycardia, COPD presents for follow up.   Admitted 04/2018 for hypercarbic respiratory failure and cellulitis.  He also had acute on chronic diastolic heart failure. BNP was elevated to 704. Net -5.6L.  Lasix was held due to worsening renal function with improved to baseline. Resume home oral Lasix.  Continued bisoprolol.  His home lisinopril was held due to acute renal failure and hypotension.  Would consider resuming this as an outpatient.  Echo showed that his systolic function was preserved but he had some hypokinesis of the basal inferior and inferolateral myocardium.  Given that he is on hospice and not having any chest pain there is no plan for an invasive ischemic evaluation at this time.  elevated troponin felt demand.    Past Medical History:  Diagnosis Date  . Anginal pain ([place])   . Anxiety   . Arthritis    "hips, knees, thoracic back" (01/22/2017)  . CAP (community acquired pneumonia) 11/2016  . 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 COPD (chronic obstructive pulmonary disease) ([place])    "pearl study" pt thinks he is on Symbicort; pt noncompliant with COPD controller meds on/off due to financial reasons.  . Coronary atherosclerosis of unspecified type of vessel, native or graft 2005   MI, s/p PCI with stent (pt reports 20+ interventions in the past, most recent cat 6/08 showed patent stents).  Normal LV function.  Nuclear stress test NEG 8/09.  . Depression    ?bipolar dx by psychiatrist?  . Gouty arthritis    Dr. Amil Amen  . Heart murmur   .  Hematochezia 09/2012   Hyperplastic polyp, diverticulosis, and internal hemorrhoids found on colonoscopy 10/2012  . History of hiatal hernia   . Hyperkalemia 01/29/2017   Kayexalate 30 given x 1.  . Hyperlipidemia    Hx of multiple statin intolerance  . Hypertension   . 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 Metastasis from malignant neoplasm of prostate ([place])    "to spine & left hip; dx'd 01/21/2017"  . Migraine    "none in the 2000s" (01/15/2017)  . Mitral valve prolapse ~ 1981  . Neuropathic pain of both feet 2015/2016   Vit B12 and A1c normal 10/2014  . Obesities, morbid ([place])   . On home oxygen therapy    "2L prn" (01/15/2017)  . OSA on CPAP    w/oxygen (01/15/2017).  Noncompliance 2019.  Dr. Halford Chessman to repeat split night sleep study as of 12/2017 (not done as of 03/03/18)  . Osteoarthrosis, unspecified whether generalized or localized, unspecified site    DDD  . PAF (paroxysmal atrial fibrillation) ([place]) 12/2016   Started in the context of resp illness/lots of bronchodilators.  Started eliquis and cardizem during hospitalization 01/2017 (Dr. Rayann Heman).  . Pre-diabetes 2016/17  . Prostate cancer metastatic to multiple sites [facility]) 09/2010; 2018   2012: Localized, high risk disease (Dr. Kimbrough/Grapey):  s/p 5 wks ext bm rad + seed boost, as well as hormone blockade x 1 yr.  Biochemical recurrence 11/2015;  2018 imaging showed bone mets  and lymph node involvement.  Palliative Lupron and zytiga 2018/19--good PSA response as of 09/2017 onc f/u. **  To start hospice 03/17/18  . Radiation    Pelvic--for prostate ca: 25 treatments/01/2011  . Seizure disorder ([place]) 08-15-2011   Deja vu sensations: no w/u.  These stopped when neurontin 300mg  tid was started for a different reason.  . Status post chemotherapy    10/2010 thru 06/2011  . Tobacco dependence    still smoking as of fall 2018.    Past Surgical History:  Procedure Laterality Date  . CARDIAC CATHETERIZATION  23 caths  . COLONOSCOPY  WITH PROPOFOL N/A 04/05/2013   Dr. Fuller Plan.  Polypectomy (hyperplastic--recall 10 yrs).  Moderate diverticulosis, +internal hemorrhoids.  No radiation proctitis.  . CORONARY ANGIOPLASTY    . CORONARY ANGIOPLASTY WITH STENT PLACEMENT     "5-6 stents" (01/15/2017)  . HERNIA REPAIR    . HOT HEMOSTASIS N/A 04/05/2013   Procedure: HOT HEMOSTASIS (ARGON PLASMA COAGULATION/BICAP);  Surgeon: Ladene Artist, MD;  Location: [facility];  Service: Endoscopy;  Laterality: N/A;  . INSERTION PROSTATE RADIATION SEED  02/24/2011  . TRANSTHORACIC ECHOCARDIOGRAM  12/2016   2018: EF 55-60%, moderate LVH, grd I DD, mild LA dilation.  11/17/17: LVEF of 55 to 16%, grade 1 diastolic dysfunction no wall motion abnormalities.  Marland Kitchen UMBILICAL HERNIA REPAIR      Current Medications: Prior to Admission medications   Medication Sig Start Date End Date Taking? Authorizing Provider  abiraterone acetate (ZYTIGA) 250 MG tablet Take 4 tablets (1,000 mg total) by mouth daily. Take on an empty stomach 1 hour before or 2 hours after a meal 01/27/18   Wyatt Portela, MD  albuterol (PROAIR HFA) 108 (90 Base) MCG/ACT inhaler Inhale 2 puffs into the lungs every 6 (six) hours as needed for wheezing or shortness of breath. 11/04/17   [name], [name], MD  albuterol (PROVENTIL) (2.5 MG/3ML) 0.083% nebulizer solution Take 3 mLs (2.5 mg total) by nebulization every 4 (four) hours as needed for wheezing or shortness of breath. 12/02/17   [name], [name], MD  allopurinol (ZYLOPRIM) 300 MG tablet Take 1 tablet (300 mg total) by mouth daily. 01/01/18   [name], [name], MD  apixaban (ELIQUIS) 5 MG TABS tablet Take 1 tablet (5 mg total) by mouth 2 (two) times daily. 09/28/17   [name], [name], MD  aspirin EC 81 MG EC tablet Take 1 tablet (81 mg total) by mouth daily. 12/27/17   Allie Bossier, MD  bisoprolol (ZEBETA) 10 MG tablet Take 1 tablet (10 mg total) by mouth daily. Patient not taking: Reported on 05/09/2018 01/07/18   [name], MD    budesonide-formoterol [facility]) 160-4.5 MCG/ACT inhaler Inhale 2 puffs into the lungs 2 (two) times daily. Patient not taking: Reported on 03/03/2018 01/28/17   [name], MD  citalopram (CELEXA) 40 MG tablet Take 1 tablet (40 mg total) by mouth at bedtime. 11/19/17   Aline August, MD  citalopram (CELEXA) 40 MG tablet Take 1 tablet (40 mg total) by mouth daily. Patient not taking: Reported on 03/03/2018 02/18/18   [name], MD  colchicine 0.6 MG tablet Take 1 tablet (0.6 mg total) by mouth 2 (two) times daily as needed (gout flare ups). Patient not taking: Reported on 05/09/2018 01/04/18   [name], MD  dexamethasone (DECADRON) 4 MG tablet Take 12 mg by mouth daily. 04/22/18   [provider]  Dextromethorphan-guaiFENesin (MUCINEX DM) 30-600 MG TB12 Take 1  tablet by mouth every 12 (twelve) hours as needed for cough or congestion. 04/15/18   [provider]  diltiazem (CARDIZEM) 60 MG tablet Take 1 tablet (60 mg total) by mouth every 12 (twelve) hours. Patient taking differently: Take 30 mg by mouth 2 (two) times daily.  12/26/17   Allie Bossier, MD  docusate sodium (COLACE) 100 MG capsule Take 100-200 mg by mouth daily as needed for constipation. 04/15/18   [provider]  feeding supplement, ENSURE ENLIVE, (ENSURE ENLIVE) LIQD Take 237 mLs by mouth 2 (two) times daily between meals. Patient taking differently: Take 237 mLs by mouth See admin instructions. 237 mls --Three to four times per day. 01/17/17   Hosie Poisson, MD  Fluticasone-Umeclidin-Vilant (TRELEGY ELLIPTA) 100-62.5-25 MCG/INH AEPB Inhale 1 puff into the lungs daily. Patient not taking: Reported on 05/09/2018 12/29/17   Chesley Mires, MD  furosemide (LASIX) 40 MG tablet Take 1 tablet (40 mg total) by mouth 2 (two) times daily. Patient taking differently: Take 40 mg by mouth daily.  12/26/17   Allie Bossier, MD  gabapentin (NEURONTIN) 300 MG capsule Take 1 capsule (300 mg total) by mouth 2  (two) times daily. 03/03/18   Caccavale, Sophia, PA-C  lisinopril (PRINIVIL,ZESTRIL) 5 MG tablet Take 1 tablet (5 mg total) by mouth daily. 05/12/18   Thurnell Lose, MD  LORazepam (ATIVAN) 1 MG tablet Take 1 tablet (1 mg total) by mouth every 8 (eight) hours. 03/11/18   Wyatt Portela, MD  metolazone (ZAROXOLYN) 2.5 MG tablet Take 1 pill once a week every Wednesday as needed if you have gained more than 2.5 pounds of weight in a week, take an extra pill of 20 mg of potassium that day. 05/12/18   Thurnell Lose, MD  morphine (MS CONTIN) 15 MG 12 hr tablet Take 15 mg by mouth every 8 (eight) hours. 04/29/18   [provider]  nicotine (NICODERM CQ - DOSED IN MG/24 HOURS) 14 mg/24hr patch Place 1 patch (14 mg total) onto the skin daily. Patient not taking: Reported on 01/04/2018 12/27/17   Allie Bossier, MD  nystatin cream (MYCOSTATIN) Apply 1 application topically 2 (two) times daily. Patient not taking: Reported on 03/03/2018 01/11/18   [name], MD  Oxycodone HCl 10 MG TABS Take 1 tablet (10 mg total) by mouth every 8 (eight) hours as needed. Patient taking differently: Take 10-20 mg by mouth every 4 (four) hours as needed (pain).  01/07/18   Wyatt Portela, MD  Oxycodone HCl 10 MG TABS Take 1-2 tablets (10-20 mg total) by mouth every 4 (four) hours as needed. Patient not taking: Reported on 05/09/2018 03/01/18   Wyatt Portela, MD  pantoprazole (PROTONIX) 40 MG tablet TAKE 1 TABLET BY MOUTH EVERY DAY Patient taking differently: Take 40 mg by mouth daily.  12/28/17   [name], [name], MD  potassium chloride 20 MEQ TBCR Take 20 mEq by mouth 2 (two) times daily for 4 days. Patient taking differently: Take 20 mEq by mouth daily.  03/03/18 05/09/18  Caccavale, Sophia, PA-C  potassium chloride SA (K-DUR,KLOR-CON) 20 MEQ tablet 1 tab po qd EVERY DAY Patient not taking: Reported on 05/09/2018 01/05/18   [name], MD  Probiotic Product (PROBIOTIC PO) Take 1 capsule by mouth  daily.    [provider]  prochlorperazine (COMPAZINE) 10 MG tablet Take 1 tablet (10 mg total) by mouth every 6 (six) hours as needed for nausea or vomiting. Patient not taking:  Reported on 05/09/2018 11/13/17   Wyatt Portela, MD  tamsulosin (FLOMAX) 0.4 MG CAPS capsule Take 1 capsule (0.4 mg total) by mouth daily after supper. Patient taking differently: Take 0.4 mg by mouth as needed (urination).  11/13/17   [name], [name], MD  traMADol (ULTRAM) 50 MG tablet TAKE 1 TABLET BY MOUTH EVERY 6HRS AS NEEDED Patient taking differently: Take 50 mg by mouth every 6 (six) hours as needed for moderate pain.  02/19/18   Wyatt Portela, MD  urea (CARMOL) 40 % CREA Apply to right lower leg once daily Patient not taking: Reported on 03/03/2018 12/02/17   [name], MD  zolpidem (AMBIEN) 10 MG tablet Take 1 tablet (10 mg total) by mouth at bedtime as needed for sleep. 04/06/18 05/09/18  Wyatt Portela, MD    Allergies:   Amitriptyline hcl and Ezetimibe   Social History   Socioeconomic History  . Marital status: Married    Spouse name: [name]  . Number of children: Not on file  . Years of education: Not on file  . Highest education level: Not on file  Occupational History  . Occupation: Retired    Fish farm manager: [employer]  Social Needs  . Financial resource strain: Not on file  . Food insecurity:    Worry: Not on file    Inability: Not on file  . Transportation needs:    Medical: Not on file    Non-medical: Not on file  Tobacco Use  . Smoking status: Former Smoker    Packs/day: 1.50    Years: 55.00    Pack years: 82.50    Types: Cigarettes  . Smokeless tobacco: Never Used  . Tobacco comment: 2 weeks ago  Substance and Sexual Activity  . Alcohol use: Not Currently    Comment: h/o heavy use - at least 6 daily, last use years ago  . Drug use: Yes    Types: Marijuana    Comment: 2-3 weeks ago last use  . Sexual activity: Never  Lifestyle  . Physical activity:    Days  per week: Not on file    Minutes per session: Not on file  . Stress: Not on file  Relationships  . Social connections:    Talks on phone: Not on file    Gets together: Not on file    Attends religious service: Not on file    Active member of club or organization: Not on file    Attends meetings of clubs or organizations: Not on file    Relationship status: Not on file  Other Topics Concern  . Not on file  Social History Narrative   ** Merged History Encounter **   Lives in [place] but also enjoys spending time at [place] History:  The patient's family history includes Arthritis in his mother; Bipolar disorder in his father; Heart disease in his father. ***  ROS:   Please see the history of present illness.    ROS All other systems reviewed and are negative.   PHYSICAL EXAM:   VS:  There were no vitals taken for this visit.   GEN: Well nourished, well developed, in no acute distress  HEENT: normal  Neck: no JVD, carotid bruits, or masses Cardiac: ***RRR; no murmurs, rubs, or gallops,no edema  Respiratory:  clear to auscultation bilaterally, normal work of breathing GI: soft, nontender, nondistended, + BS MS: no deformity or atrophy  Skin: warm  and dry, no rash Neuro:  Alert and Oriented x 3, Strength and sensation are intact Psych: euthymic mood, full affect  Wt Readings from Last 3 Encounters:  05/11/18 232 lb 2.3 oz (105.3 kg)  03/11/18 231 lb 4.8 oz (104.9 kg)  01/04/18 260 lb (117.9 kg)      Studies/Labs Reviewed:   EKG:  EKG is ordered today.  The ekg ordered today demonstrates ***  Recent Labs: 05/09/2018: B Natriuretic Peptide 704.7 05/10/2018: ALT 33 05/11/2018: Magnesium 2.2 05/12/2018: BUN 60; Creatinine, Ser 1.13; Hemoglobin 14.8; Platelets 192; Potassium 4.7; Sodium 142   Lipid Panel    Component Value Date/Time   CHOL 211 (H) 10/31/2016 0934   TRIG 137.0 10/31/2016 0934   HDL 37.30 (L) 10/31/2016 0934   CHOLHDL 6  10/31/2016 0934   VLDL 27.4 10/31/2016 0934   LDLCALC 146 (H) 10/31/2016 0934   LDLDIRECT 130.7 12/02/2012 0916    Additional studies/ records that were reviewed today include:   Echo 05/10/18: Study Conclusions  - Left ventricle: Poor acoustic windows limit study, even with Definity use. Ovreall LVEF is approxiimalely 55% with basal inferior, basal inferolateral hypokinesis. The cavity size was normal. Wall thickness was increased in a pattern of mild LVH. Systolic function was normal. The estimated ejection fraction was in the range of 55% to 60%. - Mitral valve: Calcified annulus.  LE Dopplers 05/10/18: Negative for DVT.    ASSESSMENT & PLAN:    1. Chronic diastolic CHF  2. PAF  3. CAD with remote PCI -  He does not want any aggressive interventions given that he is on hospice.  Continue bisoprolol. No ASA due to need to anticoagulation.   4. HTN      Medication Adjustments/Labs and Tests Ordered: Current medicines are reviewed at length with the patient today.  Concerns regarding medicines are outlined above.  Medication changes, Labs and Tests ordered today are listed in the Patient Instructions below. There are no Patient Instructions on file for this visit.   Jarrett Soho, [place]  05/24/2018 8:53 AM    [address], [place], [place]  [address] Phone: [phone]; Fax: [phone]

## 2018-05-25 ENCOUNTER — Other Ambulatory Visit: Payer: Self-pay | Admitting: Family Medicine

## 2018-05-25 ENCOUNTER — Ambulatory Visit: Admitting: Physician Assistant

## 2018-05-25 NOTE — Telephone Encounter (Signed)
Pt recently d/c from hospital. Please advise. Thanks.

## 2018-05-25 NOTE — Telephone Encounter (Signed)
Will do lasix RF, but he needs to have 30 min hosp f/u with me in 7-10d.-thx

## 2018-05-26 DIAGNOSIS — I4891 Unspecified atrial fibrillation: Secondary | ICD-10-CM | POA: Diagnosis not present

## 2018-05-26 DIAGNOSIS — I110 Hypertensive heart disease with heart failure: Secondary | ICD-10-CM | POA: Diagnosis not present

## 2018-05-26 DIAGNOSIS — C7951 Secondary malignant neoplasm of bone: Secondary | ICD-10-CM | POA: Diagnosis not present

## 2018-05-26 DIAGNOSIS — R590 Localized enlarged lymph nodes: Secondary | ICD-10-CM | POA: Diagnosis not present

## 2018-05-26 DIAGNOSIS — C61 Malignant neoplasm of prostate: Secondary | ICD-10-CM | POA: Diagnosis not present

## 2018-05-26 DIAGNOSIS — Z191 Hormone sensitive malignancy status: Secondary | ICD-10-CM | POA: Diagnosis not present

## 2018-05-26 NOTE — Telephone Encounter (Signed)
Left message for pt to call back.   Okay for PEC to advise pt. 

## 2018-05-27 DIAGNOSIS — R590 Localized enlarged lymph nodes: Secondary | ICD-10-CM | POA: Diagnosis not present

## 2018-05-27 DIAGNOSIS — Z191 Hormone sensitive malignancy status: Secondary | ICD-10-CM | POA: Diagnosis not present

## 2018-05-27 DIAGNOSIS — C61 Malignant neoplasm of prostate: Secondary | ICD-10-CM | POA: Diagnosis not present

## 2018-05-27 DIAGNOSIS — C7951 Secondary malignant neoplasm of bone: Secondary | ICD-10-CM | POA: Diagnosis not present

## 2018-05-27 DIAGNOSIS — I4891 Unspecified atrial fibrillation: Secondary | ICD-10-CM | POA: Diagnosis not present

## 2018-05-27 DIAGNOSIS — I110 Hypertensive heart disease with heart failure: Secondary | ICD-10-CM | POA: Diagnosis not present

## 2018-06-02 DIAGNOSIS — C61 Malignant neoplasm of prostate: Secondary | ICD-10-CM | POA: Diagnosis not present

## 2018-06-02 DIAGNOSIS — C7951 Secondary malignant neoplasm of bone: Secondary | ICD-10-CM | POA: Diagnosis not present

## 2018-06-02 DIAGNOSIS — Z191 Hormone sensitive malignancy status: Secondary | ICD-10-CM | POA: Diagnosis not present

## 2018-06-02 DIAGNOSIS — I4891 Unspecified atrial fibrillation: Secondary | ICD-10-CM | POA: Diagnosis not present

## 2018-06-02 DIAGNOSIS — R590 Localized enlarged lymph nodes: Secondary | ICD-10-CM | POA: Diagnosis not present

## 2018-06-02 DIAGNOSIS — I110 Hypertensive heart disease with heart failure: Secondary | ICD-10-CM | POA: Diagnosis not present

## 2018-06-02 NOTE — Telephone Encounter (Signed)
SW Dr. Anitra Lauth, pt is currently in hospices and unable to come in for follow ups. We will defer all medication mangament/refills to hospice MD.

## 2018-06-03 DIAGNOSIS — Z191 Hormone sensitive malignancy status: Secondary | ICD-10-CM | POA: Diagnosis not present

## 2018-06-03 DIAGNOSIS — I110 Hypertensive heart disease with heart failure: Secondary | ICD-10-CM | POA: Diagnosis not present

## 2018-06-03 DIAGNOSIS — C61 Malignant neoplasm of prostate: Secondary | ICD-10-CM | POA: Diagnosis not present

## 2018-06-03 DIAGNOSIS — R590 Localized enlarged lymph nodes: Secondary | ICD-10-CM | POA: Diagnosis not present

## 2018-06-03 DIAGNOSIS — I4891 Unspecified atrial fibrillation: Secondary | ICD-10-CM | POA: Diagnosis not present

## 2018-06-03 DIAGNOSIS — C7951 Secondary malignant neoplasm of bone: Secondary | ICD-10-CM | POA: Diagnosis not present

## 2018-06-08 DIAGNOSIS — I4891 Unspecified atrial fibrillation: Secondary | ICD-10-CM | POA: Diagnosis not present

## 2018-06-08 DIAGNOSIS — Z191 Hormone sensitive malignancy status: Secondary | ICD-10-CM | POA: Diagnosis not present

## 2018-06-08 DIAGNOSIS — I110 Hypertensive heart disease with heart failure: Secondary | ICD-10-CM | POA: Diagnosis not present

## 2018-06-08 DIAGNOSIS — C7951 Secondary malignant neoplasm of bone: Secondary | ICD-10-CM | POA: Diagnosis not present

## 2018-06-08 DIAGNOSIS — C61 Malignant neoplasm of prostate: Secondary | ICD-10-CM | POA: Diagnosis not present

## 2018-06-08 DIAGNOSIS — R590 Localized enlarged lymph nodes: Secondary | ICD-10-CM | POA: Diagnosis not present

## 2018-06-09 DIAGNOSIS — C7951 Secondary malignant neoplasm of bone: Secondary | ICD-10-CM | POA: Diagnosis not present

## 2018-06-09 DIAGNOSIS — R590 Localized enlarged lymph nodes: Secondary | ICD-10-CM | POA: Diagnosis not present

## 2018-06-09 DIAGNOSIS — I110 Hypertensive heart disease with heart failure: Secondary | ICD-10-CM | POA: Diagnosis not present

## 2018-06-09 DIAGNOSIS — C61 Malignant neoplasm of prostate: Secondary | ICD-10-CM | POA: Diagnosis not present

## 2018-06-09 DIAGNOSIS — Z191 Hormone sensitive malignancy status: Secondary | ICD-10-CM | POA: Diagnosis not present

## 2018-06-09 DIAGNOSIS — I4891 Unspecified atrial fibrillation: Secondary | ICD-10-CM | POA: Diagnosis not present

## 2018-06-13 DIAGNOSIS — C61 Malignant neoplasm of prostate: Secondary | ICD-10-CM | POA: Diagnosis not present

## 2018-06-13 DIAGNOSIS — C7951 Secondary malignant neoplasm of bone: Secondary | ICD-10-CM | POA: Diagnosis not present

## 2018-06-13 DIAGNOSIS — I2510 Atherosclerotic heart disease of native coronary artery without angina pectoris: Secondary | ICD-10-CM | POA: Diagnosis not present

## 2018-06-13 DIAGNOSIS — Z191 Hormone sensitive malignancy status: Secondary | ICD-10-CM | POA: Diagnosis not present

## 2018-06-13 DIAGNOSIS — I110 Hypertensive heart disease with heart failure: Secondary | ICD-10-CM | POA: Diagnosis not present

## 2018-06-13 DIAGNOSIS — I509 Heart failure, unspecified: Secondary | ICD-10-CM | POA: Diagnosis not present

## 2018-06-13 DIAGNOSIS — I4891 Unspecified atrial fibrillation: Secondary | ICD-10-CM | POA: Diagnosis not present

## 2018-06-13 DIAGNOSIS — R590 Localized enlarged lymph nodes: Secondary | ICD-10-CM | POA: Diagnosis not present

## 2018-06-13 DIAGNOSIS — G4733 Obstructive sleep apnea (adult) (pediatric): Secondary | ICD-10-CM | POA: Diagnosis not present

## 2018-06-13 DIAGNOSIS — Z87891 Personal history of nicotine dependence: Secondary | ICD-10-CM | POA: Diagnosis not present

## 2018-06-13 DIAGNOSIS — J449 Chronic obstructive pulmonary disease, unspecified: Secondary | ICD-10-CM | POA: Diagnosis not present

## 2018-06-16 DIAGNOSIS — J449 Chronic obstructive pulmonary disease, unspecified: Secondary | ICD-10-CM | POA: Diagnosis not present

## 2018-06-16 DIAGNOSIS — I4891 Unspecified atrial fibrillation: Secondary | ICD-10-CM | POA: Diagnosis not present

## 2018-06-16 DIAGNOSIS — R590 Localized enlarged lymph nodes: Secondary | ICD-10-CM | POA: Diagnosis not present

## 2018-06-16 DIAGNOSIS — C61 Malignant neoplasm of prostate: Secondary | ICD-10-CM | POA: Diagnosis not present

## 2018-06-16 DIAGNOSIS — C7951 Secondary malignant neoplasm of bone: Secondary | ICD-10-CM | POA: Diagnosis not present

## 2018-06-16 DIAGNOSIS — Z191 Hormone sensitive malignancy status: Secondary | ICD-10-CM | POA: Diagnosis not present

## 2018-06-17 DIAGNOSIS — Z191 Hormone sensitive malignancy status: Secondary | ICD-10-CM | POA: Diagnosis not present

## 2018-06-17 DIAGNOSIS — C7951 Secondary malignant neoplasm of bone: Secondary | ICD-10-CM | POA: Diagnosis not present

## 2018-06-17 DIAGNOSIS — I4891 Unspecified atrial fibrillation: Secondary | ICD-10-CM | POA: Diagnosis not present

## 2018-06-17 DIAGNOSIS — J449 Chronic obstructive pulmonary disease, unspecified: Secondary | ICD-10-CM | POA: Diagnosis not present

## 2018-06-17 DIAGNOSIS — R590 Localized enlarged lymph nodes: Secondary | ICD-10-CM | POA: Diagnosis not present

## 2018-06-17 DIAGNOSIS — C61 Malignant neoplasm of prostate: Secondary | ICD-10-CM | POA: Diagnosis not present

## 2018-06-18 ENCOUNTER — Telehealth: Payer: Self-pay | Admitting: *Deleted

## 2018-06-18 DIAGNOSIS — C61 Malignant neoplasm of prostate: Secondary | ICD-10-CM | POA: Diagnosis not present

## 2018-06-18 DIAGNOSIS — C7951 Secondary malignant neoplasm of bone: Secondary | ICD-10-CM | POA: Diagnosis not present

## 2018-06-18 DIAGNOSIS — Z191 Hormone sensitive malignancy status: Secondary | ICD-10-CM | POA: Diagnosis not present

## 2018-06-18 DIAGNOSIS — R590 Localized enlarged lymph nodes: Secondary | ICD-10-CM | POA: Diagnosis not present

## 2018-06-18 DIAGNOSIS — I4891 Unspecified atrial fibrillation: Secondary | ICD-10-CM | POA: Diagnosis not present

## 2018-06-18 DIAGNOSIS — J449 Chronic obstructive pulmonary disease, unspecified: Secondary | ICD-10-CM | POA: Diagnosis not present

## 2018-06-18 NOTE — Telephone Encounter (Signed)
Hospice nurse Larena Glassman calling. States patient is having penile breakdown d/t wetness. unable to catherize with regular foley. Per  dr Alen Blew, may   use coude' catheter.

## 2018-06-22 DIAGNOSIS — C61 Malignant neoplasm of prostate: Secondary | ICD-10-CM | POA: Diagnosis not present

## 2018-06-22 DIAGNOSIS — R590 Localized enlarged lymph nodes: Secondary | ICD-10-CM | POA: Diagnosis not present

## 2018-06-22 DIAGNOSIS — J449 Chronic obstructive pulmonary disease, unspecified: Secondary | ICD-10-CM | POA: Diagnosis not present

## 2018-06-22 DIAGNOSIS — Z191 Hormone sensitive malignancy status: Secondary | ICD-10-CM | POA: Diagnosis not present

## 2018-06-22 DIAGNOSIS — C7951 Secondary malignant neoplasm of bone: Secondary | ICD-10-CM | POA: Diagnosis not present

## 2018-06-22 DIAGNOSIS — I4891 Unspecified atrial fibrillation: Secondary | ICD-10-CM | POA: Diagnosis not present

## 2018-06-23 DIAGNOSIS — J449 Chronic obstructive pulmonary disease, unspecified: Secondary | ICD-10-CM | POA: Diagnosis not present

## 2018-06-23 DIAGNOSIS — Z191 Hormone sensitive malignancy status: Secondary | ICD-10-CM | POA: Diagnosis not present

## 2018-06-23 DIAGNOSIS — C61 Malignant neoplasm of prostate: Secondary | ICD-10-CM | POA: Diagnosis not present

## 2018-06-23 DIAGNOSIS — C7951 Secondary malignant neoplasm of bone: Secondary | ICD-10-CM | POA: Diagnosis not present

## 2018-06-23 DIAGNOSIS — I4891 Unspecified atrial fibrillation: Secondary | ICD-10-CM | POA: Diagnosis not present

## 2018-06-23 DIAGNOSIS — R590 Localized enlarged lymph nodes: Secondary | ICD-10-CM | POA: Diagnosis not present

## 2018-06-24 DIAGNOSIS — R590 Localized enlarged lymph nodes: Secondary | ICD-10-CM | POA: Diagnosis not present

## 2018-06-24 DIAGNOSIS — I4891 Unspecified atrial fibrillation: Secondary | ICD-10-CM | POA: Diagnosis not present

## 2018-06-24 DIAGNOSIS — C61 Malignant neoplasm of prostate: Secondary | ICD-10-CM | POA: Diagnosis not present

## 2018-06-24 DIAGNOSIS — C7951 Secondary malignant neoplasm of bone: Secondary | ICD-10-CM | POA: Diagnosis not present

## 2018-06-24 DIAGNOSIS — Z191 Hormone sensitive malignancy status: Secondary | ICD-10-CM | POA: Diagnosis not present

## 2018-06-24 DIAGNOSIS — J449 Chronic obstructive pulmonary disease, unspecified: Secondary | ICD-10-CM | POA: Diagnosis not present

## 2018-06-25 DIAGNOSIS — Z191 Hormone sensitive malignancy status: Secondary | ICD-10-CM | POA: Diagnosis not present

## 2018-06-25 DIAGNOSIS — C61 Malignant neoplasm of prostate: Secondary | ICD-10-CM | POA: Diagnosis not present

## 2018-06-25 DIAGNOSIS — I4891 Unspecified atrial fibrillation: Secondary | ICD-10-CM | POA: Diagnosis not present

## 2018-06-25 DIAGNOSIS — J449 Chronic obstructive pulmonary disease, unspecified: Secondary | ICD-10-CM | POA: Diagnosis not present

## 2018-06-25 DIAGNOSIS — C7951 Secondary malignant neoplasm of bone: Secondary | ICD-10-CM | POA: Diagnosis not present

## 2018-06-25 DIAGNOSIS — R590 Localized enlarged lymph nodes: Secondary | ICD-10-CM | POA: Diagnosis not present

## 2018-06-26 DIAGNOSIS — I4891 Unspecified atrial fibrillation: Secondary | ICD-10-CM | POA: Diagnosis not present

## 2018-06-26 DIAGNOSIS — C7951 Secondary malignant neoplasm of bone: Secondary | ICD-10-CM | POA: Diagnosis not present

## 2018-06-26 DIAGNOSIS — Z191 Hormone sensitive malignancy status: Secondary | ICD-10-CM | POA: Diagnosis not present

## 2018-06-26 DIAGNOSIS — J449 Chronic obstructive pulmonary disease, unspecified: Secondary | ICD-10-CM | POA: Diagnosis not present

## 2018-06-26 DIAGNOSIS — R590 Localized enlarged lymph nodes: Secondary | ICD-10-CM | POA: Diagnosis not present

## 2018-06-26 DIAGNOSIS — C61 Malignant neoplasm of prostate: Secondary | ICD-10-CM | POA: Diagnosis not present

## 2018-06-27 DIAGNOSIS — Z191 Hormone sensitive malignancy status: Secondary | ICD-10-CM | POA: Diagnosis not present

## 2018-06-27 DIAGNOSIS — I4891 Unspecified atrial fibrillation: Secondary | ICD-10-CM | POA: Diagnosis not present

## 2018-06-27 DIAGNOSIS — C7951 Secondary malignant neoplasm of bone: Secondary | ICD-10-CM | POA: Diagnosis not present

## 2018-06-27 DIAGNOSIS — J449 Chronic obstructive pulmonary disease, unspecified: Secondary | ICD-10-CM | POA: Diagnosis not present

## 2018-06-27 DIAGNOSIS — C61 Malignant neoplasm of prostate: Secondary | ICD-10-CM | POA: Diagnosis not present

## 2018-06-27 DIAGNOSIS — R590 Localized enlarged lymph nodes: Secondary | ICD-10-CM | POA: Diagnosis not present

## 2018-06-28 DIAGNOSIS — R590 Localized enlarged lymph nodes: Secondary | ICD-10-CM | POA: Diagnosis not present

## 2018-06-28 DIAGNOSIS — I4891 Unspecified atrial fibrillation: Secondary | ICD-10-CM | POA: Diagnosis not present

## 2018-06-28 DIAGNOSIS — J449 Chronic obstructive pulmonary disease, unspecified: Secondary | ICD-10-CM | POA: Diagnosis not present

## 2018-06-28 DIAGNOSIS — C61 Malignant neoplasm of prostate: Secondary | ICD-10-CM | POA: Diagnosis not present

## 2018-06-28 DIAGNOSIS — Z191 Hormone sensitive malignancy status: Secondary | ICD-10-CM | POA: Diagnosis not present

## 2018-06-28 DIAGNOSIS — C7951 Secondary malignant neoplasm of bone: Secondary | ICD-10-CM | POA: Diagnosis not present

## 2018-07-14 DEATH — deceased

## 2018-07-28 ENCOUNTER — Telehealth: Payer: Self-pay | Admitting: Family Medicine

## 2018-07-28 NOTE — Telephone Encounter (Signed)
Copied from [place] [phone]. Topic: Quick Communication - See Telephone Encounter >> Jul 28, 2018 12:27 PM Vernona Rieger wrote: CRM for notification. See Telephone encounter for: 07/28/18.  Veronda Prude with [employer] patient assistance program called regarding medication for apixaban (ELIQUIS) 5 MG TABS tablet, she has some questions for Heather. Please call back @ [phone]

## 2018-07-28 NOTE — Telephone Encounter (Signed)
Called [name], Scott Rollins she stated that Veronda Prude was calling to let us know that pt has been approved for assistance and that they will be contacting pt before sending out the medication.

## 2018-11-09 IMAGING — CT CT ABD-PELV W/ CM
2 of 5 series · 14 of 46 positions shown, 16 images · IV contrast (ISOVUE 300)
Comparison: 10/04/2010 CT abdomen/pelvis.

CLINICAL DATA: Metastatic prostate cancer. Ongoing oral
chemotherapy. Restaging.

EXAM:
CT ABDOMEN AND PELVIS WITH CONTRAST
TECHNIQUE: Multidetector CT imaging of the abdomen and pelvis was performed
using the standard protocol following bolus administration of
intravenous contrast.
CONTRAST:  100mL [id number] IOPAMIDOL ([id number]) INJECTION 61%

[Series 2: axial st · axial · 0.98mm/px · z∈[-429,-4]mm · 11 of 103 slices shown, 13 images]
[im 9/103  soft-tissue]
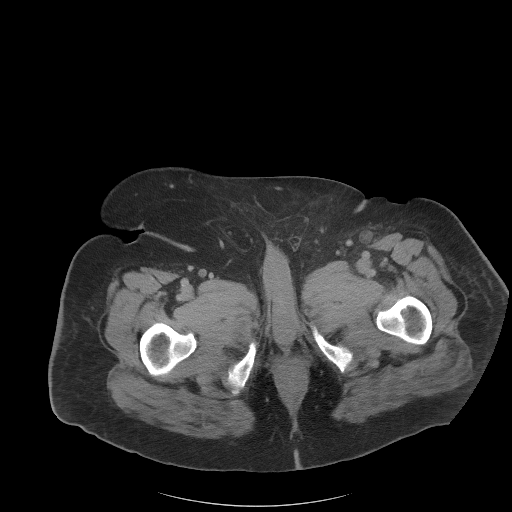
[im 9/103  bone]
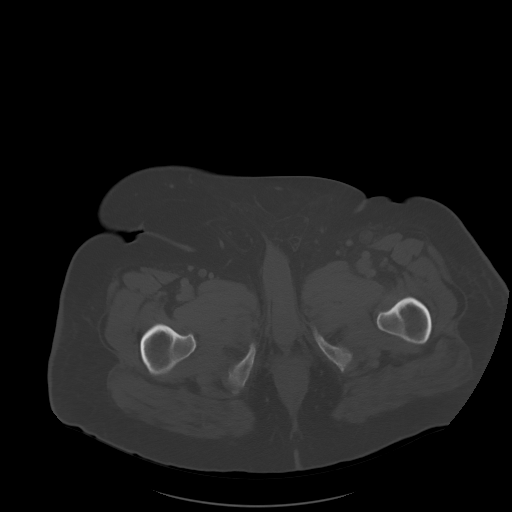
[im 18/103  soft-tissue]
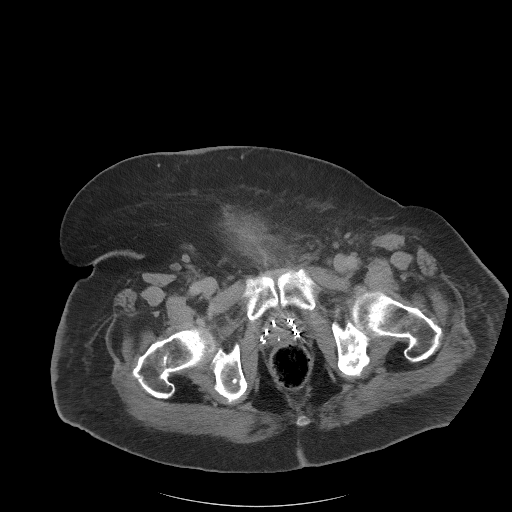
[im 26/103  soft-tissue]
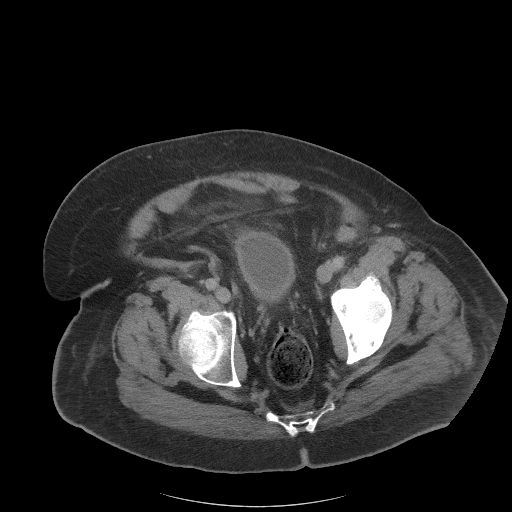
[im 35/103  soft-tissue]
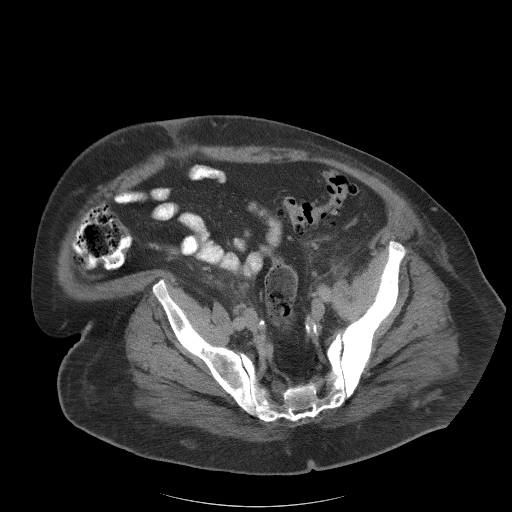
[im 43/103  soft-tissue]
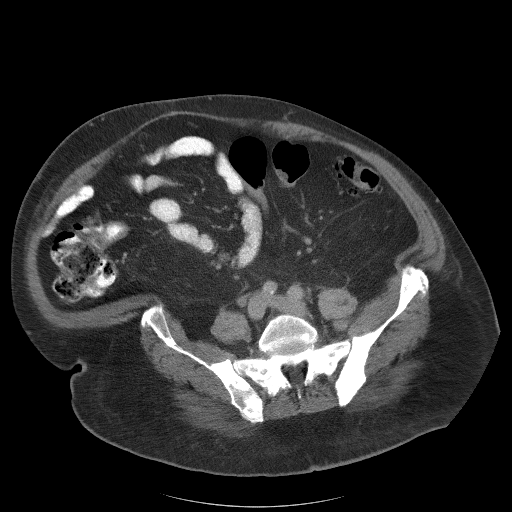
[im 52/103  soft-tissue]
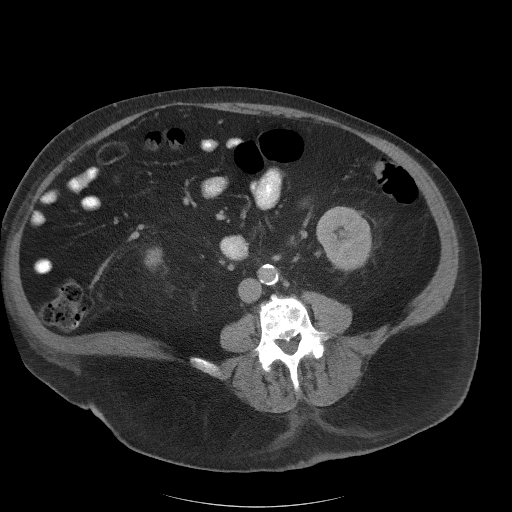
[im 60/103  soft-tissue]
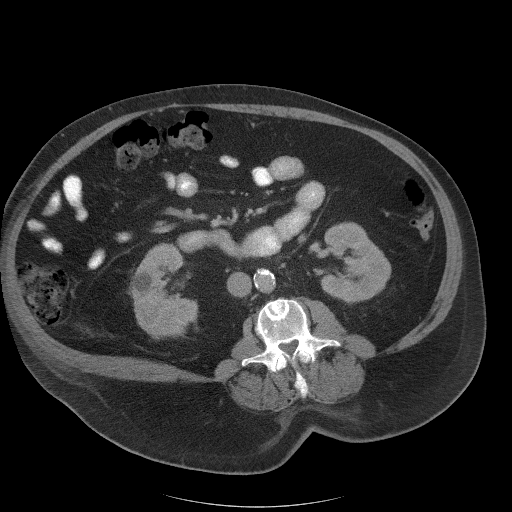
[im 69/103  soft-tissue]
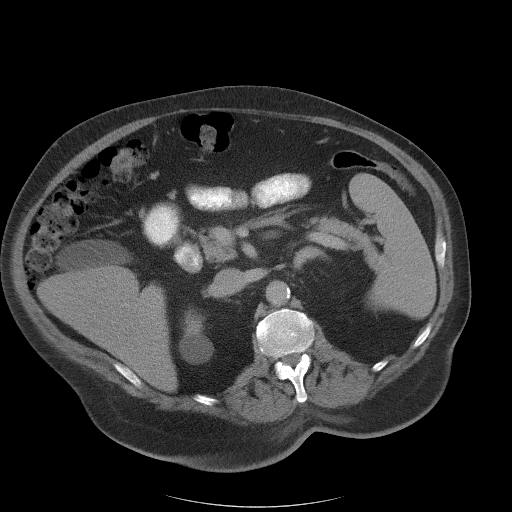
[im 77/103  soft-tissue]
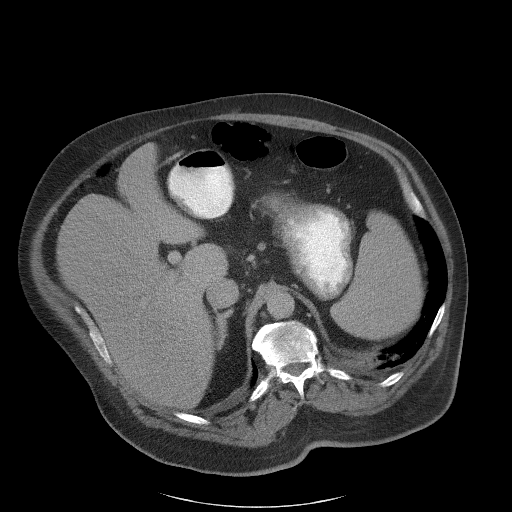
[im 77/103  bone]
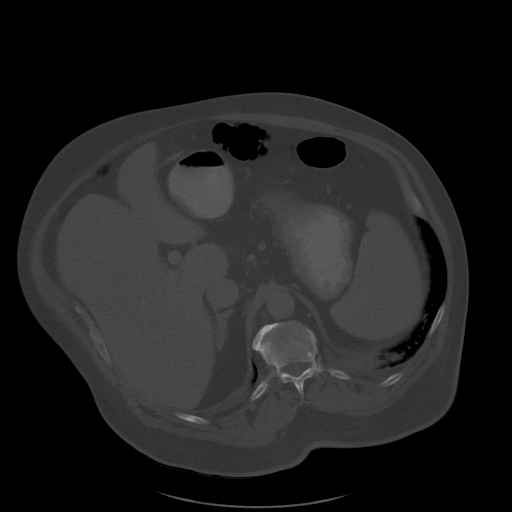
[im 86/103  soft-tissue]
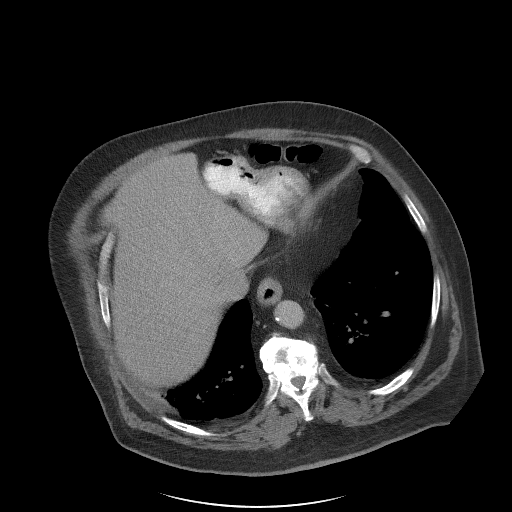
[im 94/103  soft-tissue]
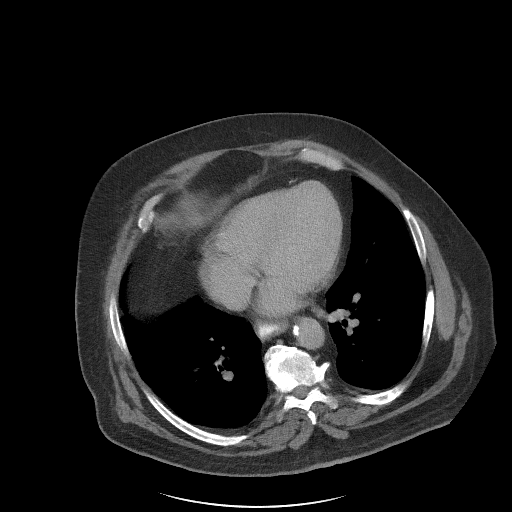

[Series 5: coronal st · coronal · 0.97mm/px · 3 of 133 slices shown]
[im 45/133  soft-tissue]
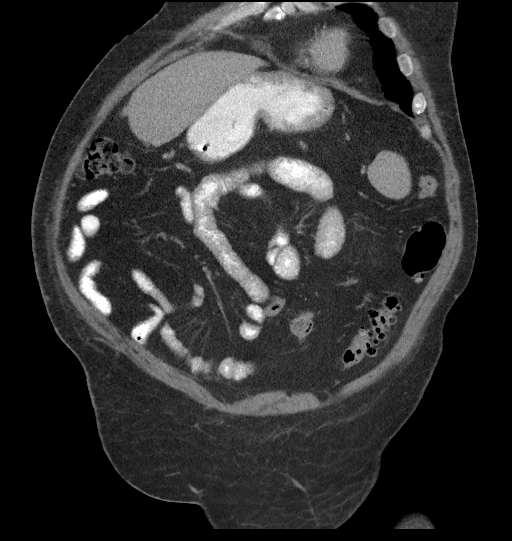
[im 59/133  soft-tissue]
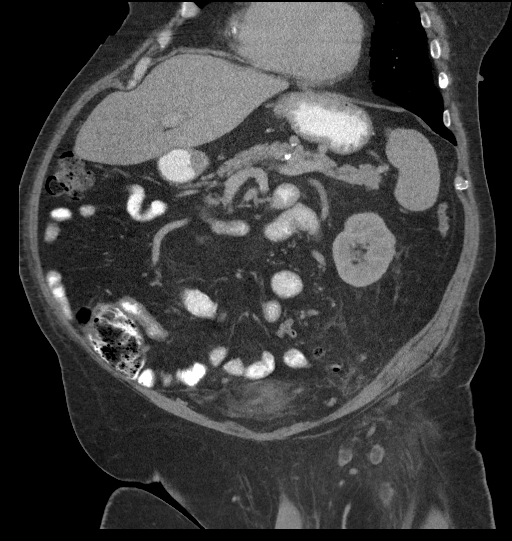
[im 74/133  soft-tissue]
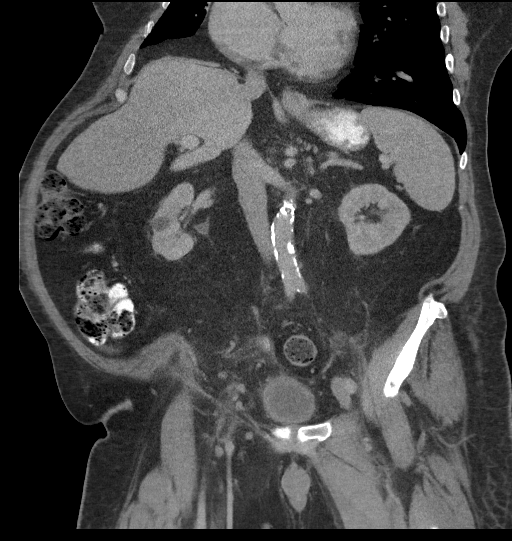

[14 of 46 positions shown; findings below may reference images not displayed]

FINDINGS: Lower chest: Patchy consolidation, tree-in-bud opacities and
ground-glass opacity in dependent basilar lower lobes bilaterally,
left greater than right. Bullous emphysema at the anterior right
lung base with diffuse bronchial wall thickening. Coronary
atherosclerosis. Oral contrast in the lower thoracic esophagus
suggests esophageal dysmotility and/or gastroesophageal reflux.
Trace dependent bilateral pleural effusions.

Hepatobiliary: Normal liver size. No liver mass. Normal gallbladder
with no radiopaque cholelithiasis. No biliary ductal dilatation.

Pancreas: Normal, with no mass or duct dilation.

Spleen: Normal size. No mass.

Adrenals/Urinary Tract: Mild diffuse thickening of both adrenal
glands without discrete adrenal nodules. No hydronephrosis.
Exophytic 3.4 cm simple renal cyst in the posterior upper right
kidney. Simple 1.9 cm interpolar right renal cyst. No
hydronephrosis. Mild diffuse bladder wall thickening. Haziness of
the perivesical fat. No significant bladder distention.

Stomach/Bowel: Normal non-distended stomach. Normal caliber small
bowel with no small bowel wall thickening. Normal appendix. Moderate
sigmoid diverticulosis, with no large bowel wall thickening or
significant pericolonic fat stranding. Oral contrast progresses to
the right colon.

Vascular/Lymphatic: Atherosclerotic nonaneurysmal abdominal aorta.
Patent portal, splenic and renal veins. Newly mildly enlarged 1.2 cm
right common iliac node (series 2/image 63). Mildly enlarged 1.0 cm
right external iliac node (series 2/image 79, stable since
10/04/2010. Mildly enlarged 1.5 cm porta hepatis node (series
2/image 28), stable since 5305. No additional pathologically
enlarged abdominopelvic nodes.

Reproductive: Normal size prostate containing multiple brachytherapy
seeds.

Other: No pneumoperitoneum, ascites or focal fluid collection.

Musculoskeletal: Sclerotic lesions throughout the visualized lower
ribs bilaterally have increased in number and sclerosis since
01/21/2017 chest CT. Extensive patchy confluent sclerotic osseous
lesions throughout the lumbar spine, left greater than right pelvic
girdle, new since the most recent comparison CT abdomen/pelvis study
of 5305. Mild lumbar spondylosis.
IMPRESSION: 1. New mild right common iliac lymphadenopathy, suspicious for
metastatic disease. Mild right external iliac and porta hepatis
adenopathy is stable since [DATE]. Extensive sclerotic osseous metastases throughout the visualized
skeleton, significantly increased since 01/21/2017 chest CT in the
lower ribs and new in the lumbar spine and bilateral pelvic girdle
since 5305 CT abdomen/pelvis study.
3. Mild diffuse bladder wall thickening. Haziness of the perivesical
fat. Recommend correlation with urinalysis to exclude acute
cystitis. No hydronephrosis.
4. Patchy consolidation, tree-in-bud opacities and ground-glass
opacity in the dependent basilar lower lobes bilaterally, left
greater than right, suggestive of a multilobar bronchopneumonia,
possibly due to aspiration.
5. Trace dependent bilateral pleural effusions.

Aortic Atherosclerosis ([id number]-[email or web address]).

## 2018-12-31 DIAGNOSIS — Z0279 Encounter for issue of other medical certificate: Secondary | ICD-10-CM

## 2019-01-06 ENCOUNTER — Telehealth: Payer: Self-pay | Admitting: Family Medicine

## 2019-01-06 NOTE — Telephone Encounter (Signed)
Left patient a VM that [employer] paperwork for [employer] has been completed by Dr. Anitra Lauth and will be mailed to him for him to complete his portion of the form

## 2019-04-20 ENCOUNTER — Telehealth: Payer: Self-pay | Admitting: Family Medicine

## 2019-04-20 NOTE — Telephone Encounter (Signed)
Wife, [name], called to let Dr. Anitra Lauth know patient is deceased. He passed on 13-Jul-2018 while in [place].  His medical records had no documentation, and his account was not flagged as "deceased". Sending to Dr. Anitra Lauth.   She is receiving a billing statement from where forms were completed in July 2020.  I told wife, Shauna Hugh, I would send to Engineer, building services for review. [name] can be reached at [phone].

## 2019-04-21 NOTE — Telephone Encounter (Signed)
Dawn can we remove this charge as a Manufacturing engineer?

## 2019-04-22 NOTE — Telephone Encounter (Signed)
Left message on machine to call back regarding bill.

## 2019-04-22 NOTE — Telephone Encounter (Signed)
°  Good Morning,  Unfortunately, I am unable to void charges, this will need to be sent over to charge correction.   Thanks,  [employer]

## 2019-04-22 NOTE — Telephone Encounter (Signed)
Scott Rollins - will you please inform his wife that I have emailed Charge Corrections and asked to have the fee removed. Thank you.
